# Patient Record
Sex: Female | Born: 1955 | Race: White | Hispanic: No | Smoking: Never smoker
Health system: Southern US, Community
[De-identification: ages and names within clinical notes are randomized; demographics above are authoritative.]

## PROBLEM LIST (undated history)

## (undated) DIAGNOSIS — F419 Anxiety disorder, unspecified: Secondary | ICD-10-CM

## (undated) DIAGNOSIS — Z9989 Dependence on other enabling machines and devices: Secondary | ICD-10-CM

## (undated) DIAGNOSIS — G709 Myoneural disorder, unspecified: Secondary | ICD-10-CM

## (undated) DIAGNOSIS — I10 Essential (primary) hypertension: Secondary | ICD-10-CM

## (undated) DIAGNOSIS — R351 Nocturia: Secondary | ICD-10-CM

## (undated) DIAGNOSIS — D126 Benign neoplasm of colon, unspecified: Secondary | ICD-10-CM

## (undated) DIAGNOSIS — J189 Pneumonia, unspecified organism: Secondary | ICD-10-CM

## (undated) DIAGNOSIS — T8859XA Other complications of anesthesia, initial encounter: Secondary | ICD-10-CM

## (undated) DIAGNOSIS — M179 Osteoarthritis of knee, unspecified: Secondary | ICD-10-CM

## (undated) DIAGNOSIS — N3020 Other chronic cystitis without hematuria: Secondary | ICD-10-CM

## (undated) DIAGNOSIS — Z9889 Other specified postprocedural states: Secondary | ICD-10-CM

## (undated) DIAGNOSIS — K4090 Unilateral inguinal hernia, without obstruction or gangrene, not specified as recurrent: Secondary | ICD-10-CM

## (undated) DIAGNOSIS — Z87898 Personal history of other specified conditions: Secondary | ICD-10-CM

## (undated) DIAGNOSIS — Z8719 Personal history of other diseases of the digestive system: Secondary | ICD-10-CM

## (undated) DIAGNOSIS — M1990 Unspecified osteoarthritis, unspecified site: Secondary | ICD-10-CM

## (undated) DIAGNOSIS — Z5189 Encounter for other specified aftercare: Secondary | ICD-10-CM

## (undated) DIAGNOSIS — G4730 Sleep apnea, unspecified: Secondary | ICD-10-CM

## (undated) DIAGNOSIS — K219 Gastro-esophageal reflux disease without esophagitis: Secondary | ICD-10-CM

## (undated) DIAGNOSIS — N393 Stress incontinence (female) (male): Secondary | ICD-10-CM

## (undated) DIAGNOSIS — R0600 Dyspnea, unspecified: Secondary | ICD-10-CM

## (undated) DIAGNOSIS — I639 Cerebral infarction, unspecified: Secondary | ICD-10-CM

## (undated) DIAGNOSIS — Z87440 Personal history of urinary (tract) infections: Secondary | ICD-10-CM

## (undated) DIAGNOSIS — N289 Disorder of kidney and ureter, unspecified: Secondary | ICD-10-CM

## (undated) DIAGNOSIS — D369 Benign neoplasm, unspecified site: Secondary | ICD-10-CM

## (undated) DIAGNOSIS — Z8673 Personal history of transient ischemic attack (TIA), and cerebral infarction without residual deficits: Secondary | ICD-10-CM

## (undated) DIAGNOSIS — M1710 Unilateral primary osteoarthritis, unspecified knee: Secondary | ICD-10-CM

## (undated) DIAGNOSIS — Z8614 Personal history of Methicillin resistant Staphylococcus aureus infection: Secondary | ICD-10-CM

## (undated) DIAGNOSIS — R51 Headache: Secondary | ICD-10-CM

## (undated) DIAGNOSIS — C4490 Unspecified malignant neoplasm of skin, unspecified: Secondary | ICD-10-CM

## (undated) DIAGNOSIS — G4733 Obstructive sleep apnea (adult) (pediatric): Secondary | ICD-10-CM

## (undated) DIAGNOSIS — T4145XA Adverse effect of unspecified anesthetic, initial encounter: Secondary | ICD-10-CM

## (undated) DIAGNOSIS — K5792 Diverticulitis of intestine, part unspecified, without perforation or abscess without bleeding: Secondary | ICD-10-CM

## (undated) DIAGNOSIS — T7840XA Allergy, unspecified, initial encounter: Secondary | ICD-10-CM

## (undated) DIAGNOSIS — R7303 Prediabetes: Secondary | ICD-10-CM

## (undated) DIAGNOSIS — Z8489 Family history of other specified conditions: Secondary | ICD-10-CM

## (undated) DIAGNOSIS — G629 Polyneuropathy, unspecified: Secondary | ICD-10-CM

## (undated) DIAGNOSIS — Z87442 Personal history of urinary calculi: Secondary | ICD-10-CM

## (undated) DIAGNOSIS — I509 Heart failure, unspecified: Secondary | ICD-10-CM

## (undated) DIAGNOSIS — E785 Hyperlipidemia, unspecified: Secondary | ICD-10-CM

## (undated) DIAGNOSIS — N182 Chronic kidney disease, stage 2 (mild): Secondary | ICD-10-CM

## (undated) DIAGNOSIS — D649 Anemia, unspecified: Secondary | ICD-10-CM

## (undated) DIAGNOSIS — J449 Chronic obstructive pulmonary disease, unspecified: Secondary | ICD-10-CM

## (undated) DIAGNOSIS — Z8709 Personal history of other diseases of the respiratory system: Secondary | ICD-10-CM

## (undated) DIAGNOSIS — K5730 Diverticulosis of large intestine without perforation or abscess without bleeding: Secondary | ICD-10-CM

## (undated) HISTORY — DX: Disorder of kidney and ureter, unspecified: N28.9

## (undated) HISTORY — DX: Allergy, unspecified, initial encounter: T78.40XA

## (undated) HISTORY — PX: MULTIPLE TOOTH EXTRACTIONS: SHX2053

## (undated) HISTORY — PX: WISDOM TOOTH EXTRACTION: SHX21

## (undated) HISTORY — DX: Sleep apnea, unspecified: G47.30

## (undated) HISTORY — DX: Hyperlipidemia, unspecified: E78.5

## (undated) HISTORY — DX: Chronic obstructive pulmonary disease, unspecified: J44.9

## (undated) HISTORY — PX: ESOPHAGEAL DILATION: SHX303

## (undated) HISTORY — DX: Gastro-esophageal reflux disease without esophagitis: K21.9

## (undated) HISTORY — DX: Encounter for other specified aftercare: Z51.89

## (undated) HISTORY — PX: POLYPECTOMY: SHX149

## (undated) HISTORY — DX: Myoneural disorder, unspecified: G70.9

---

## 1985-07-07 HISTORY — PX: TUBAL LIGATION: SHX77

## 2004-10-31 ENCOUNTER — Emergency Department (HOSPITAL_COMMUNITY): Admission: EM | Admit: 2004-10-31 | Discharge: 2004-10-31 | Payer: Self-pay | Admitting: Emergency Medicine

## 2005-01-07 ENCOUNTER — Ambulatory Visit (HOSPITAL_BASED_OUTPATIENT_CLINIC_OR_DEPARTMENT_OTHER): Admission: RE | Admit: 2005-01-07 | Discharge: 2005-01-07 | Payer: Self-pay | Admitting: Orthopedic Surgery

## 2005-01-19 ENCOUNTER — Ambulatory Visit: Payer: Self-pay | Admitting: Internal Medicine

## 2005-02-12 ENCOUNTER — Ambulatory Visit: Payer: Self-pay | Admitting: Internal Medicine

## 2005-03-07 ENCOUNTER — Ambulatory Visit: Payer: Self-pay | Admitting: Internal Medicine

## 2005-03-13 ENCOUNTER — Ambulatory Visit (HOSPITAL_COMMUNITY): Admission: RE | Admit: 2005-03-13 | Discharge: 2005-03-13 | Payer: Self-pay | Admitting: Internal Medicine

## 2005-04-16 ENCOUNTER — Ambulatory Visit (HOSPITAL_COMMUNITY): Admission: RE | Admit: 2005-04-16 | Discharge: 2005-04-17 | Payer: Self-pay | Admitting: Orthopedic Surgery

## 2005-05-05 ENCOUNTER — Ambulatory Visit: Payer: Self-pay | Admitting: Internal Medicine

## 2005-05-06 ENCOUNTER — Ambulatory Visit (HOSPITAL_COMMUNITY): Admission: RE | Admit: 2005-05-06 | Discharge: 2005-05-06 | Payer: Self-pay | Admitting: Internal Medicine

## 2005-05-14 ENCOUNTER — Ambulatory Visit: Payer: Self-pay | Admitting: Internal Medicine

## 2005-05-23 ENCOUNTER — Ambulatory Visit: Payer: Self-pay | Admitting: Internal Medicine

## 2005-05-23 ENCOUNTER — Ambulatory Visit (HOSPITAL_COMMUNITY): Admission: RE | Admit: 2005-05-23 | Discharge: 2005-05-23 | Payer: Self-pay | Admitting: Internal Medicine

## 2005-07-29 ENCOUNTER — Inpatient Hospital Stay (HOSPITAL_COMMUNITY): Admission: EM | Admit: 2005-07-29 | Discharge: 2005-07-30 | Payer: Self-pay | Admitting: Emergency Medicine

## 2005-08-29 ENCOUNTER — Ambulatory Visit: Payer: Self-pay | Admitting: Internal Medicine

## 2005-09-10 ENCOUNTER — Ambulatory Visit: Payer: Self-pay | Admitting: Internal Medicine

## 2005-10-02 ENCOUNTER — Ambulatory Visit (HOSPITAL_COMMUNITY): Admission: RE | Admit: 2005-10-02 | Discharge: 2005-10-03 | Payer: Self-pay | Admitting: Orthopedic Surgery

## 2006-07-07 HISTORY — PX: KNEE ARTHROSCOPY: SUR90

## 2010-08-20 ENCOUNTER — Telehealth (INDEPENDENT_AMBULATORY_CARE_PROVIDER_SITE_OTHER): Payer: Self-pay | Admitting: *Deleted

## 2010-08-28 NOTE — Progress Notes (Signed)
  Phone Note Other Incoming   Request: Send information Summary of Call: Request for records received from [facility]. Request forwarded to [facility].  07/07/2008-D Brodie...chrt

## 2012-06-21 ENCOUNTER — Emergency Department (HOSPITAL_COMMUNITY)
Admission: EM | Admit: 2012-06-21 | Discharge: 2012-06-22 | Disposition: A | Discharge status: Home / Self Care | Payer: Medicaid Other | Attending: Emergency Medicine | Admitting: Emergency Medicine

## 2012-06-21 ENCOUNTER — Inpatient Hospital Stay (HOSPITAL_COMMUNITY)
Admission: AD | Admit: 2012-06-21 | Discharge: 2012-06-21 | Disposition: A | Discharge status: Home / Self Care | Payer: Self-pay | Source: Ambulatory Visit | Attending: Obstetrics & Gynecology | Admitting: Obstetrics & Gynecology

## 2012-06-21 DIAGNOSIS — R269 Unspecified abnormalities of gait and mobility: Secondary | ICD-10-CM | POA: Insufficient documentation

## 2012-06-21 DIAGNOSIS — Z79899 Other long term (current) drug therapy: Secondary | ICD-10-CM | POA: Insufficient documentation

## 2012-06-21 DIAGNOSIS — R112 Nausea with vomiting, unspecified: Secondary | ICD-10-CM | POA: Insufficient documentation

## 2012-06-21 DIAGNOSIS — R319 Hematuria, unspecified: Secondary | ICD-10-CM | POA: Insufficient documentation

## 2012-06-21 DIAGNOSIS — R109 Unspecified abdominal pain: Secondary | ICD-10-CM | POA: Insufficient documentation

## 2012-06-21 DIAGNOSIS — J029 Acute pharyngitis, unspecified: Secondary | ICD-10-CM | POA: Insufficient documentation

## 2012-06-21 DIAGNOSIS — Z7982 Long term (current) use of aspirin: Secondary | ICD-10-CM | POA: Insufficient documentation

## 2012-06-21 DIAGNOSIS — Z8673 Personal history of transient ischemic attack (TIA), and cerebral infarction without residual deficits: Secondary | ICD-10-CM | POA: Insufficient documentation

## 2012-06-21 DIAGNOSIS — R197 Diarrhea, unspecified: Secondary | ICD-10-CM | POA: Insufficient documentation

## 2012-06-21 DIAGNOSIS — K921 Melena: Secondary | ICD-10-CM | POA: Insufficient documentation

## 2012-06-21 DIAGNOSIS — I10 Essential (primary) hypertension: Secondary | ICD-10-CM | POA: Insufficient documentation

## 2012-06-21 DIAGNOSIS — M129 Arthropathy, unspecified: Secondary | ICD-10-CM | POA: Insufficient documentation

## 2012-06-21 DIAGNOSIS — Z8719 Personal history of other diseases of the digestive system: Secondary | ICD-10-CM | POA: Insufficient documentation

## 2012-06-21 DIAGNOSIS — N390 Urinary tract infection, site not specified: Secondary | ICD-10-CM

## 2012-06-21 DIAGNOSIS — M549 Dorsalgia, unspecified: Secondary | ICD-10-CM | POA: Insufficient documentation

## 2012-06-21 HISTORY — DX: Essential (primary) hypertension: I10

## 2012-06-21 HISTORY — DX: Cerebral infarction, unspecified: I63.9

## 2012-06-21 HISTORY — DX: Diverticulitis of intestine, part unspecified, without perforation or abscess without bleeding: K57.92

## 2012-06-21 HISTORY — DX: Unspecified osteoarthritis, unspecified site: M19.90

## 2012-06-22 ENCOUNTER — Emergency Department (HOSPITAL_COMMUNITY): Payer: Medicaid Other

## 2012-06-22 ENCOUNTER — Encounter (HOSPITAL_COMMUNITY): Payer: Self-pay | Admitting: *Deleted

## 2012-06-22 ENCOUNTER — Encounter: Payer: Self-pay | Admitting: Internal Medicine

## 2012-06-22 LAB — CBC WITH DIFFERENTIAL/PLATELET
Basophils Absolute: 0.1 10*3/uL (ref 0.0–0.1)
Basophils Relative: 1 % (ref 0–1)
Eosinophils Absolute: 0.2 10*3/uL (ref 0.0–0.7)
Eosinophils Relative: 3 % (ref 0–5)
HCT: 39.5 % (ref 36.0–46.0)
Hemoglobin: 13.3 g/dL (ref 12.0–15.0)
Lymphocytes Relative: 32 % (ref 12–46)
Lymphs Abs: 2.5 10*3/uL (ref 0.7–4.0)
MCH: 32.7 pg (ref 26.0–34.0)
MCHC: 33.7 g/dL (ref 30.0–36.0)
MCV: 97.1 fL (ref 78.0–100.0)
Monocytes Absolute: 0.6 10*3/uL (ref 0.1–1.0)
Monocytes Relative: 8 % (ref 3–12)
Neutro Abs: 4.3 10*3/uL (ref 1.7–7.7)
Neutrophils Relative %: 56 % (ref 43–77)
Platelets: 329 10*3/uL (ref 150–400)
RBC: 4.07 MIL/uL (ref 3.87–5.11)
RDW: 13.5 % (ref 11.5–15.5)
WBC: 7.7 10*3/uL (ref 4.0–10.5)

## 2012-06-22 LAB — URINALYSIS, ROUTINE W REFLEX MICROSCOPIC
Bilirubin Urine: NEGATIVE
Glucose, UA: NEGATIVE mg/dL
Ketones, ur: NEGATIVE mg/dL
Nitrite: NEGATIVE
Protein, ur: NEGATIVE mg/dL
Specific Gravity, Urine: 1.031 — ABNORMAL HIGH (ref 1.005–1.030)
Urobilinogen, UA: 1 mg/dL (ref 0.0–1.0)
pH: 6 (ref 5.0–8.0)

## 2012-06-22 LAB — OCCULT BLOOD, POC DEVICE: Fecal Occult Bld: NEGATIVE

## 2012-06-22 LAB — URINE MICROSCOPIC-ADD ON

## 2012-06-22 LAB — COMPREHENSIVE METABOLIC PANEL
ALT: 17 U/L (ref 0–35)
AST: 20 U/L (ref 0–37)
Albumin: 3.6 g/dL (ref 3.5–5.2)
Alkaline Phosphatase: 63 U/L (ref 39–117)
BUN: 15 mg/dL (ref 6–23)
CO2: 28 mEq/L (ref 19–32)
Calcium: 9.2 mg/dL (ref 8.4–10.5)
Chloride: 104 mEq/L (ref 96–112)
Creatinine, Ser: 0.91 mg/dL (ref 0.50–1.10)
GFR calc Af Amer: 81 mL/min — ABNORMAL LOW (ref >90)
GFR calc non Af Amer: 70 mL/min — ABNORMAL LOW (ref >90)
Glucose, Bld: 108 mg/dL — ABNORMAL HIGH (ref 70–99)
Potassium: 4 mEq/L (ref 3.5–5.1)
Sodium: 142 mEq/L (ref 135–145)
Total Bilirubin: 0.2 mg/dL — ABNORMAL LOW (ref 0.3–1.2)
Total Protein: 7.5 g/dL (ref 6.0–8.3)

## 2012-06-22 MED ORDER — CEPHALEXIN 500 MG PO CAPS: 500.0000 mg | ORAL_CAPSULE | Freq: Four times a day (QID) | ORAL | Status: DC

## 2012-06-22 MED ORDER — SODIUM CHLORIDE 0.9 % IV BOLUS (SEPSIS)
1000.0000 mL | Freq: Once | INTRAVENOUS | Status: AC
  Administered 2012-06-22: 1000 mL via INTRAVENOUS

## 2012-06-22 MED ORDER — CEFTRIAXONE SODIUM 1 G IJ SOLR
1.0000 g | INTRAMUSCULAR | Status: DC
  Administered 2012-06-22: 1 g via INTRAVENOUS
  Filled 2012-06-22: qty 10

## 2012-06-22 NOTE — ED Notes (Signed)
Attempted to round - pt in restroom

## 2012-06-22 NOTE — ED Provider Notes (Signed)
History     CSN: [id number]  Arrival date & time 06/21/12  2351   First MD Initiated Contact with Patient 06/22/12 0026      Chief Complaint  Patient presents with  . Multiple complaints     (Consider location/radiation/quality/duration/timing/severity/associated sxs/prior treatment) Patient is a 56 y.o. female presenting with dysuria and hematochezia. The history is provided by the patient and a relative. No language interpreter was used.  Dysuria  The current episode started more than 1 week ago. The problem occurs every urination. The problem has not changed since onset.The quality of the pain is described as burning. The pain is at a severity of 5/10. The pain is moderate. There has been no fever. Associated symptoms include nausea, vomiting, hematuria and flank pain. She has tried nothing for the symptoms. Her past medical history does not include kidney stones, urological procedure or recurrent UTIs.  Rectal Bleeding  The current episode started more than 2 weeks ago. The problem occurs occasionally. The problem has been unchanged. The pain is mild. The stool is described as streaked with blood. There was no prior successful therapy. Associated symptoms include abdominal pain, diarrhea, nausea, vomiting and hematuria. Pertinent negatives include no vaginal bleeding and no vaginal discharge. She has been behaving normally. She has been eating and drinking normally. Her past medical history is significant for inflammatory bowel disease.   56 year old female coming in with multiple complaints including rectal bleeding, dysuria, back pain, lightheaded. Patient states that she also has sleep apnea but has never had BiPAP machine at home. Patient has not been to the doctor since 2008 she was diagnosed with diverticulitis because of no insurance and [place] put a lean on her house because she could not pay bill.  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 She's also  complaining of pain in her right heel and thinks there is something in her foot. No acute distress. Medical records in [place]. Also c/o sore throat when swallowing 2/10 with prior esophogeal stricture and repair per Dr. Juanda Chance.  No difficulty swallowing today.   Past Medical History  Diagnosis Date  . Apnea   . Hypertension   . Diverticulitis   . Stroke   . Arthritis     Past Surgical History  Procedure Date  . Tubal ligation   . Knee arthroscopy     No family history on file.  History  Substance Use Topics  . Smoking status: Never Smoker   . Smokeless tobacco: Never Used  . Alcohol Use: No    OB History    Grav Para Term Preterm Abortions TAB SAB Ect Mult Living                  Review of Systems  Constitutional: Negative.   HENT: Positive for sore throat. Negative for facial swelling, trouble swallowing and neck pain.   Eyes: Negative.   Respiratory: Negative.  Negative for choking and shortness of breath.   Cardiovascular: Negative.  Negative for palpitations and leg swelling.  Gastrointestinal: Positive for nausea, vomiting, abdominal pain, diarrhea and hematochezia.  Genitourinary: Positive for dysuria, hematuria and flank pain. Negative for vaginal bleeding and vaginal discharge.  Musculoskeletal: Positive for back pain and gait problem.  Neurological: Negative.   Psychiatric/Behavioral: Negative.   All other systems reviewed and are negative.    Allergies  Review of patient's allergies indicates not on file.  Home Medications  No current outpatient prescriptions on file.  BP 162/92  Pulse 79  Temp 97.7 F (36.5 C) (Oral)  Resp 20  SpO2 98%  Physical Exam  Nursing note and vitals reviewed. Constitutional: She is oriented to person, place, and time. She appears well-developed and well-nourished.       obese  HENT:  Head: Normocephalic and atraumatic.  Eyes: Conjunctivae normal and EOM are normal. Pupils are equal, round, and reactive to light.   Neck: Normal range of motion. Neck supple. No tracheal deviation present.  Cardiovascular: Normal rate, regular rhythm and normal heart sounds.   Pulmonary/Chest: Effort normal and breath sounds normal. No stridor. No respiratory distress.  Abdominal: Soft. Bowel sounds are normal. She exhibits no distension and no mass. There is tenderness. There is no rebound and no guarding.       Suprapubic tenderness  Musculoskeletal: Normal range of motion. She exhibits no edema and no tenderness.       Tenderness to L heel.  No deformity noted. +cms to foot.   Lymphadenopathy:    She has no cervical adenopathy.  Neurological: She is alert and oriented to person, place, and time. She has normal reflexes.  Skin: Skin is warm and dry.  Psychiatric: She has a normal mood and affect.    ED Course  Procedures (including critical care time)  Labs Reviewed  COMPREHENSIVE METABOLIC PANEL - Abnormal; Notable for the following:    Glucose, Bld 108 (*)     Total Bilirubin 0.2 (*)     GFR calc non Af Amer 70 (*)     GFR calc Af Amer 81 (*)     All other components within normal limits  CBC WITH DIFFERENTIAL  URINALYSIS, ROUTINE W REFLEX MICROSCOPIC  OCCULT BLOOD X 1 CARD TO LAB, STOOL   No results found.   No diagnosis found.    MDM  56 yo female with multiple complaints including, foot pain. Rectal bleeding/pain, dysuria, hematuria, sore throat. + UTI. No fever or elevated WBC.  No abdominal pain on exam.  - stool guiac, Hgb 13.3. Urine specific gravity 1.031.  NS bolus and IV rocephen in the ER.  She will follow up with GI this week for the rectal bleeding.  No foreign body in L heel x-ray reviewed by myself.  Follow up with ortho for her foot pain.  Take IBUprofen for pain.  She understands to return for n/v, fever or abdominal pain.          Remi Haggard, NP 06/22/12 1135

## 2012-06-22 NOTE — ED Notes (Signed)
Pt from home with c/o rectal bleeding x 2 months. Pt sts that she has seen both bright red blood and dark stools for 2 months. Patient also c/o hematuria x 2 months. Sts she is having right flank pain along with hematuria. Patient also c/o right foot pain x 2 months. Sts she is hardly able to walk due to the pain in her heel. Pt c/o throat pain at this time as well- sts that she has had her esophagus stretched in 2008- feels as if there is fullness in her throat. Denies shob. Patient alert and oriented at this time.

## 2012-06-22 NOTE — ED Notes (Signed)
NP at bedside.

## 2012-06-22 NOTE — ED Notes (Signed)
Pt having dark bloody stools for 2 months and c/o blood in urine (rust color).

## 2012-06-22 NOTE — ED Provider Notes (Signed)
Medical screening examination/treatment/procedure(s) were performed by non-physician practitioner and as supervising physician I was immediately available for consultation/collaboration.  Bridie Colquhoun, MD 06/22/12 2254 

## 2012-06-29 LAB — URINE CULTURE: Colony Count: 15000

## 2012-07-02 ENCOUNTER — Encounter: Payer: Self-pay | Admitting: Internal Medicine

## 2012-07-02 ENCOUNTER — Encounter: Payer: Self-pay | Admitting: *Deleted

## 2012-07-27 ENCOUNTER — Encounter (HOSPITAL_COMMUNITY): Payer: Self-pay | Admitting: *Deleted

## 2012-07-27 ENCOUNTER — Inpatient Hospital Stay (HOSPITAL_COMMUNITY)
Admission: EM | Admit: 2012-07-27 | Discharge: 2012-08-01 | DRG: 193 | Disposition: A | Discharge status: Home / Self Care | Payer: Medicaid Other | Attending: Internal Medicine | Admitting: Internal Medicine

## 2012-07-27 ENCOUNTER — Emergency Department (HOSPITAL_COMMUNITY): Payer: Medicaid Other

## 2012-07-27 DIAGNOSIS — R05 Cough: Secondary | ICD-10-CM | POA: Diagnosis present

## 2012-07-27 DIAGNOSIS — Z23 Encounter for immunization: Secondary | ICD-10-CM

## 2012-07-27 DIAGNOSIS — Z8719 Personal history of other diseases of the digestive system: Secondary | ICD-10-CM

## 2012-07-27 DIAGNOSIS — J9600 Acute respiratory failure, unspecified whether with hypoxia or hypercapnia: Secondary | ICD-10-CM

## 2012-07-27 DIAGNOSIS — I10 Essential (primary) hypertension: Secondary | ICD-10-CM | POA: Diagnosis present

## 2012-07-27 DIAGNOSIS — Z79899 Other long term (current) drug therapy: Secondary | ICD-10-CM

## 2012-07-27 DIAGNOSIS — G4733 Obstructive sleep apnea (adult) (pediatric): Secondary | ICD-10-CM | POA: Diagnosis present

## 2012-07-27 DIAGNOSIS — R059 Cough, unspecified: Secondary | ICD-10-CM | POA: Diagnosis present

## 2012-07-27 DIAGNOSIS — J9691 Respiratory failure, unspecified with hypoxia: Secondary | ICD-10-CM

## 2012-07-27 DIAGNOSIS — E875 Hyperkalemia: Secondary | ICD-10-CM | POA: Diagnosis not present

## 2012-07-27 DIAGNOSIS — J189 Pneumonia, unspecified organism: Principal | ICD-10-CM | POA: Diagnosis present

## 2012-07-27 DIAGNOSIS — Z8673 Personal history of transient ischemic attack (TIA), and cerebral infarction without residual deficits: Secondary | ICD-10-CM

## 2012-07-27 LAB — CBC WITH DIFFERENTIAL/PLATELET
Basophils Relative: 0 % (ref 0–1)
Hemoglobin: 12.7 g/dL (ref 12.0–15.0)
Lymphs Abs: 1.1 10*3/uL (ref 0.7–4.0)
Monocytes Relative: 7 % (ref 3–12)
Neutro Abs: 4.1 10*3/uL (ref 1.7–7.7)
Neutrophils Relative %: 73 % (ref 43–77)
RBC: 4.04 MIL/uL (ref 3.87–5.11)

## 2012-07-27 LAB — BASIC METABOLIC PANEL
BUN: 8 mg/dL (ref 6–23)
Chloride: 103 mEq/L (ref 96–112)
GFR calc Af Amer: >90 mL/min (ref >90)
Glucose, Bld: 131 mg/dL — ABNORMAL HIGH (ref 70–99)
Potassium: 3.8 mEq/L (ref 3.5–5.1)
Sodium: 137 mEq/L (ref 135–145)

## 2012-07-27 MED ORDER — KETOROLAC TROMETHAMINE 30 MG/ML IJ SOLN
30.0000 mg | Freq: Once | INTRAMUSCULAR | Status: AC
  Administered 2012-07-27: 30 mg via INTRAVENOUS
  Filled 2012-07-27: qty 1

## 2012-07-27 MED ORDER — IPRATROPIUM BROMIDE 0.02 % IN SOLN
0.5000 mg | Freq: Once | RESPIRATORY_TRACT | Status: AC
  Administered 2012-07-27: 0.5 mg via RESPIRATORY_TRACT
  Filled 2012-07-27: qty 2.5

## 2012-07-27 MED ORDER — DEXTROSE 5 % IV SOLN
1.0000 g | Freq: Once | INTRAVENOUS | Status: AC
  Administered 2012-07-27: 1 g via INTRAVENOUS
  Filled 2012-07-27: qty 10

## 2012-07-27 MED ORDER — AZITHROMYCIN 250 MG PO TABS
500.0000 mg | ORAL_TABLET | Freq: Once | ORAL | Status: AC
  Administered 2012-07-27: 500 mg via ORAL
  Filled 2012-07-27: qty 2

## 2012-07-27 MED ORDER — DEXAMETHASONE SODIUM PHOSPHATE 10 MG/ML IJ SOLN
10.0000 mg | Freq: Once | INTRAMUSCULAR | Status: AC
  Administered 2012-07-27: 10 mg via INTRAVENOUS
  Filled 2012-07-27: qty 1

## 2012-07-27 MED ORDER — HYDROCOD POLST-CHLORPHEN POLST 10-8 MG/5ML PO LQCR
5.0000 mL | Freq: Once | ORAL | Status: AC
  Administered 2012-07-27: 5 mL via ORAL
  Filled 2012-07-27: qty 5

## 2012-07-27 MED ORDER — ALBUTEROL SULFATE (5 MG/ML) 0.5% IN NEBU
5.0000 mg | INHALATION_SOLUTION | Freq: Once | RESPIRATORY_TRACT | Status: AC
  Administered 2012-07-27: 5 mg via RESPIRATORY_TRACT
  Filled 2012-07-27: qty 1

## 2012-07-27 MED ORDER — SODIUM CHLORIDE 0.9 % IV BOLUS (SEPSIS)
500.0000 mL | Freq: Once | INTRAVENOUS | Status: AC
  Administered 2012-07-27: 500 mL via INTRAVENOUS

## 2012-07-27 MED ORDER — METOCLOPRAMIDE HCL 5 MG/ML IJ SOLN
10.0000 mg | Freq: Once | INTRAMUSCULAR | Status: AC
  Administered 2012-07-27: 10 mg via INTRAVENOUS
  Filled 2012-07-27: qty 2

## 2012-07-27 MED ORDER — DIPHENHYDRAMINE HCL 50 MG/ML IJ SOLN
25.0000 mg | Freq: Once | INTRAMUSCULAR | Status: AC
  Administered 2012-07-27: 25 mg via INTRAVENOUS
  Filled 2012-07-27: qty 1

## 2012-07-27 NOTE — ED Notes (Signed)
Patient transported to X-ray 

## 2012-07-27 NOTE — ED Notes (Signed)
Pt states for over a week now she's had a congested cough, green mucus, headache, head pressure. Pt states it hurts to take a deep breath.

## 2012-07-27 NOTE — ED Provider Notes (Signed)
History     CSN: [id number]  Arrival date & time 07/27/12  1644   First MD Initiated Contact with Patient 07/27/12 2012      Chief Complaint  Patient presents with  . Cough  . Headache   HPI  History provided by the patient. Patient is a 57 year old female with history of hypertension and TIA who presents with complaints of persistent productive cough and shortness of breath. Patient has had productive cough with green sputum for the past week and a half. Symptoms have been gradually worsening and are not improve with over-the-counter cough and cold medications. Patient now also has associated general throbbing headache similar to her typical migraine headaches but more diffuse. Headache is worse with coughing. She has some chest discomfort with deep breath and cough. She has had some subjective fevers and chills over the past 2 days. This has improved slightly with Tylenol. Denies any episodes of vomiting or diarrhea. Denies any swelling in extremities.     Past Medical History  Diagnosis Date  . Apnea   . Hypertension   . Diverticulitis   . Stroke   . Arthritis   . Hiatal hernia   . Esophageal stricture     Past Surgical History  Procedure Date  . Tubal ligation   . Knee arthroscopy     History reviewed. No pertinent family history.  History  Substance Use Topics  . Smoking status: Never Smoker   . Smokeless tobacco: Never Used  . Alcohol Use: No    OB History    Grav Para Term Preterm Abortions TAB SAB Ect Mult Living                  Review of Systems  Constitutional: Positive for fever, chills and appetite change.  HENT: Positive for congestion.   Respiratory: Positive for cough and shortness of breath.   Cardiovascular: Negative for palpitations and leg swelling.  Neurological: Positive for headaches. Negative for light-headedness.  All other systems reviewed and are negative.    Allergies  Review of patient's allergies indicates no known  allergies.  Home Medications   Current Outpatient Rx  Name  Route  Sig  Dispense  Refill  . ACETAMINOPHEN 325 MG PO TABS   Oral   Take 650 mg by mouth every 6 (six) hours as needed.         . ASPIRIN EC 81 MG PO TBEC   Oral   Take 81 mg by mouth every evening.         Merrilee [place]ashore D PO   Oral   Take 1 tablet by mouth 2 (two) times daily as needed. Flu-like symptoms / cough         . IBUPROFEN 200 MG PO TABS   Oral   Take 200 mg by mouth every 6 (six) hours as needed. For pain or headache         . LISINOPRIL 20 MG PO TABS   Oral   Take 20 mg by mouth 2 (two) times daily.           BP 138/81  Pulse 79  Temp 99.3 F (37.4 C) (Oral)  Resp 20  SpO2 93%  Physical Exam  Nursing note and vitals reviewed. Constitutional: She is oriented to person, place, and time. She appears well-developed and well-nourished. No distress.  HENT:  Head: Normocephalic.  Mouth/Throat: Oropharynx is clear and moist.  Neck: Normal range of motion. Neck supple. No JVD present.  Cardiovascular:  Normal rate and regular rhythm.   No murmur heard. Pulmonary/Chest: Effort normal. No respiratory distress. She has wheezes. She has rales.  Abdominal: Soft. There is no tenderness. There is no rebound and no guarding.  Musculoskeletal: Normal range of motion. She exhibits no edema and no tenderness.  Neurological: She is alert and oriented to person, place, and time.  Skin: Skin is warm and dry. No rash noted. No erythema.  Psychiatric: She has a normal mood and affect. Her behavior is normal.    ED Course  Procedures   Results for orders placed during the hospital encounter of 07/27/12  CBC WITH DIFFERENTIAL      Component Value Range   WBC 5.6  4.0 - 10.5 K/uL   RBC 4.04  3.87 - 5.11 MIL/uL   Hemoglobin 12.7  12.0 - 15.0 g/dL   HCT 84.6  96.2 - 95.2 %   MCV 98.0  78.0 - 100.0 fL   MCH 31.4  26.0 - 34.0 pg   MCHC 32.1  30.0 - 36.0 g/dL   RDW 84.1  32.4 - 40.1 %   Platelets 224   150 - 400 K/uL   Neutrophils Relative 73  43 - 77 %   Neutro Abs 4.1  1.7 - 7.7 K/uL   Lymphocytes Relative 20  12 - 46 %   Lymphs Abs 1.1  0.7 - 4.0 K/uL   Monocytes Relative 7  3 - 12 %   Monocytes Absolute 0.4  0.1 - 1.0 K/uL   Eosinophils Relative 1  0 - 5 %   Eosinophils Absolute 0.0  0.0 - 0.7 K/uL   Basophils Relative 0  0 - 1 %   Basophils Absolute 0.0  0.0 - 0.1 K/uL  BASIC METABOLIC PANEL      Component Value Range   Sodium 137  135 - 145 mEq/L   Potassium 3.8  3.5 - 5.1 mEq/L   Chloride 103  96 - 112 mEq/L   CO2 24  19 - 32 mEq/L   Glucose, Bld 131 (*) 70 - 99 mg/dL   BUN 8  6 - 23 mg/dL   Creatinine, Ser 0.27  0.50 - 1.10 mg/dL   Calcium 8.6  8.4 - 25.3 mg/dL   GFR calc non Af Amer >90  >90 mL/min   GFR calc Af Amer >90  >90 mL/min  PRO B NATRIURETIC PEPTIDE      Component Value Range   Pro B Natriuretic peptide (BNP) 129.9 (*) 0 - 125 pg/mL       Dg Chest 2 View  07/27/2012  *RADIOLOGY REPORT*  Clinical Data: Cough and weakness.  CHEST - 2 VIEW  Comparison: 07/29/2005  Findings: Two views of the chest demonstrate patchy densities in the lungs, left side greater than right.  There is also fullness in the right paratracheal region which could be related to technique and slightly low lung volume.  Overall, the heart size is within normal limits.  No evidence for pleural effusions.  Degenerative changes in the thoracic spine.  IMPRESSION: New patchy densities in the lungs, particularly on the left side. Findings are concerning for multifocal pneumonia versus asymmetric edema.  Recommend follow up imaging to ensure resolution.  Fullness in the right paratracheal region.  Recommend attention to this area on followup imaging to exclude lymphadenopathy.   Original Report Authenticated By: Richarda Overlie, M.D.      1. CAP (community acquired pneumonia)       MDM  8:20 PM  patient seen and evaluated. Patient currently resting in bed appears comfortable. She is on 2 L O2 nasal  cannula with saturation between 94-96%.  X-ray showing diffuse opacities in bilateral lungs. Patient with no prior history of CHF. No other clinical signs concerning for CHF.  Patient discussed with attending physician. Will plan on admit.  Spoke with [facility]. They'll see patient and admit to med surge bed.    Angus Seller, [place] 07/27/12 [phone]

## 2012-07-27 NOTE — ED Notes (Signed)
Cough and headache for 2 to 3 days,  Along with fevers and weakness

## 2012-07-27 NOTE — ED Notes (Signed)
Attempted IV twice, IV unsuccessful, pt didn't tolerate well

## 2012-07-27 NOTE — H&P (Signed)
Chief Complaint:  Cough, fever  HPI: 57 yo female with cough and wheezing for over one week.  No prior lung disease or h/o asthma.  Nonsmoker but husband smokes a lot in home.  Several days of worsening sob and wheezing with fever.  Found to have bilateral pna on cxr.  Gets pna one to 2 times a year.  Wheezes mult times a month.  Never been evaluated.  No n/v.  No dysuria.  No cp, abd pain.  Review of Systems:  O/w neg  Past Medical History: Past Medical History  Diagnosis Date  . Apnea   . Hypertension   . Diverticulitis   . Stroke   . Arthritis   . Hiatal hernia   . Esophageal stricture    Past Surgical History  Procedure Date  . Tubal ligation   . Knee arthroscopy     Medications: Prior to Admission medications   Medication Sig Start Date End Date Taking? Authorizing Provider  acetaminophen (TYLENOL) 325 MG tablet Take 650 mg by mouth every 6 (six) hours as needed.   Yes Historical Provider, MD  aspirin EC 81 MG tablet Take 81 mg by mouth every evening.   Yes Historical Provider, MD  Chlorphen-Pseudoephed-APAP (CORICIDIN D PO) Take 1 tablet by mouth 2 (two) times daily as needed. Flu-like symptoms / cough   Yes Historical Provider, MD  ibuprofen (ADVIL,MOTRIN) 200 MG tablet Take 200 mg by mouth every 6 (six) hours as needed. For pain or headache   Yes Historical Provider, MD  lisinopril (PRINIVIL,ZESTRIL) 20 MG tablet Take 20 mg by mouth 2 (two) times daily.   Yes Historical Provider, MD    Allergies:  No Known Allergies  Social History:  reports that she has never smoked. She has never used smokeless tobacco. She reports that she does not drink alcohol or use illicit drugs.  Family History: History reviewed. No pertinent family history.  Physical Exam: Filed Vitals:   07/27/12 1832 07/27/12 2044 07/27/12 2326  BP: 138/81  165/59  Pulse: 77 79 96  Temp: 99.3 F (37.4 C)  98.2 F (36.8 C)  TempSrc: Oral  Oral  Resp: 18 20 22   SpO2: 91% 93% 96%   General  appearance: alert, cooperative and mild distress Neck: no JVD and supple, symmetrical, trachea midline Lungs: wheezes bilaterally Heart: regular rate and rhythm, S1, S2 normal, no murmur, click, rub or gallop Abdomen: soft, non-tender; bowel sounds normal; no masses,  no organomegaly Extremities: extremities normal, atraumatic, no cyanosis or edema Pulses: 2+ and symmetric Skin: Skin color, texture, turgor normal. No rashes or lesions Neurologic: Grossly normal    Labs on Admission:    [facility] 07/27/12 2149  WBC 5.6  NEUTROABS 4.1  HGB 12.7  HCT 39.6  MCV 98.0  PLT 224   Radiological Exams on Admission: Dg Chest 2 View  07/27/2012  *RADIOLOGY REPORT*  Clinical Data: Cough and weakness.  CHEST - 2 VIEW  Comparison: 07/29/2005  Findings: Two views of the chest demonstrate patchy densities in the lungs, left side greater than right.  There is also fullness in the right paratracheal region which could be related to technique and slightly low lung volume.  Overall, the heart size is within normal limits.  No evidence for pleural effusions.  Degenerative changes in the thoracic spine.  IMPRESSION: New patchy densities in the lungs, particularly on the left side. Findings are concerning for multifocal pneumonia versus asymmetric edema.  Recommend follow up imaging to ensure resolution.  Fullness  in the right paratracheal region.  Recommend attention to this area on followup imaging to exclude lymphadenopathy.   Original Report Authenticated By: Richarda Overlie, M.D.     Assessment/Plan  57 yo female with hypoxic resp failure from bilateral CAP Principal Problem:  *Respiratory failure with hypoxia Active Problems:  Hypertension  CAP (community acquired pneumonia)  Cough  Place on pna pathway.  freq nebs and iv abx.  May need steroids if does not have improvement with albuterol.  Suspect underlying asthma.  Will need PFT after she recovers from this illness.  Oxygen prn.  Full  code.  Roselle Norton A 07/27/2012, 11:32 PM

## 2012-07-27 NOTE — ED Notes (Signed)
When ambulating with pt to the bathroom pt O2 sats was at 80% RM, and HR at 116.  RN was notified of O2 sats and HR

## 2012-07-28 LAB — CBC
MCV: 99 fL (ref 78.0–100.0)
Platelets: 232 10*3/uL (ref 150–400)
RBC: 3.85 MIL/uL — ABNORMAL LOW (ref 3.87–5.11)
RDW: 13.7 % (ref 11.5–15.5)
WBC: 7.1 10*3/uL (ref 4.0–10.5)

## 2012-07-28 LAB — CREATININE, SERUM
Creatinine, Ser: 0.72 mg/dL (ref 0.50–1.10)
GFR calc Af Amer: >90 mL/min (ref >90)
GFR calc non Af Amer: >90 mL/min (ref >90)

## 2012-07-28 MED ORDER — ALBUTEROL SULFATE (5 MG/ML) 0.5% IN NEBU
2.5000 mg | INHALATION_SOLUTION | RESPIRATORY_TRACT | Status: DC | PRN
  Administered 2012-07-28 – 2012-08-01 (×6): 2.5 mg via RESPIRATORY_TRACT
  Filled 2012-07-28 (×7): qty 0.5

## 2012-07-28 MED ORDER — ALBUTEROL SULFATE (5 MG/ML) 0.5% IN NEBU: 2.5000 mg | INHALATION_SOLUTION | Freq: Four times a day (QID) | RESPIRATORY_TRACT | Status: DC

## 2012-07-28 MED ORDER — KETOROLAC TROMETHAMINE 30 MG/ML IJ SOLN
30.0000 mg | Freq: Once | INTRAMUSCULAR | Status: AC
  Administered 2012-07-28: 30 mg via INTRAVENOUS
  Filled 2012-07-28: qty 1

## 2012-07-28 MED ORDER — GUAIFENESIN 100 MG/5ML PO SOLN
200.0000 mg | ORAL | Status: DC | PRN
  Administered 2012-07-28 – 2012-08-01 (×15): 200 mg via ORAL
  Filled 2012-07-28 (×15): qty 10

## 2012-07-28 MED ORDER — METOCLOPRAMIDE HCL 5 MG/ML IJ SOLN
10.0000 mg | Freq: Once | INTRAMUSCULAR | Status: AC
  Administered 2012-07-28: 10 mg via INTRAVENOUS
  Filled 2012-07-28: qty 2

## 2012-07-28 MED ORDER — DIPHENHYDRAMINE HCL 25 MG PO CAPS
25.0000 mg | ORAL_CAPSULE | Freq: Every evening | ORAL | Status: DC | PRN
  Administered 2012-07-28: 25 mg via ORAL
  Filled 2012-07-28: qty 1

## 2012-07-28 MED ORDER — ALBUTEROL SULFATE (5 MG/ML) 0.5% IN NEBU
2.5000 mg | INHALATION_SOLUTION | Freq: Four times a day (QID) | RESPIRATORY_TRACT | Status: DC
  Administered 2012-07-28 – 2012-08-01 (×15): 2.5 mg via RESPIRATORY_TRACT
  Filled 2012-07-28 (×15): qty 0.5

## 2012-07-28 MED ORDER — SODIUM CHLORIDE 0.9 % IV SOLN: 250.0000 mL | INTRAVENOUS | Status: DC | PRN

## 2012-07-28 MED ORDER — ACETAMINOPHEN 325 MG PO TABS
650.0000 mg | ORAL_TABLET | Freq: Four times a day (QID) | ORAL | Status: DC | PRN
  Administered 2012-07-28 – 2012-07-30 (×2): 650 mg via ORAL
  Filled 2012-07-28 (×2): qty 2

## 2012-07-28 MED ORDER — DIPHENHYDRAMINE HCL 50 MG/ML IJ SOLN
25.0000 mg | Freq: Once | INTRAMUSCULAR | Status: AC
  Administered 2012-07-28: 25 mg via INTRAVENOUS
  Filled 2012-07-28: qty 1

## 2012-07-28 MED ORDER — SODIUM CHLORIDE 0.9 % IJ SOLN: 3.0000 mL | INTRAMUSCULAR | Status: DC | PRN

## 2012-07-28 MED ORDER — SODIUM CHLORIDE 0.9 % IJ SOLN
3.0000 mL | Freq: Two times a day (BID) | INTRAMUSCULAR | Status: DC
  Administered 2012-07-28 – 2012-08-01 (×8): 3 mL via INTRAVENOUS

## 2012-07-28 MED ORDER — ALBUTEROL SULFATE (5 MG/ML) 0.5% IN NEBU
2.5000 mg | INHALATION_SOLUTION | RESPIRATORY_TRACT | Status: DC | PRN
Start: 2012-07-28 — End: 2012-07-28
  Administered 2012-07-28: 2.5 mg via RESPIRATORY_TRACT
  Filled 2012-07-28: qty 0.5

## 2012-07-28 MED ORDER — DEXTROSE 5 % IV SOLN
500.0000 mg | INTRAVENOUS | Status: DC
  Administered 2012-07-28: 500 mg via INTRAVENOUS
  Filled 2012-07-28: qty 500

## 2012-07-28 MED ORDER — LISINOPRIL 20 MG PO TABS
20.0000 mg | ORAL_TABLET | Freq: Two times a day (BID) | ORAL | Status: DC
  Administered 2012-07-28 – 2012-07-29 (×5): 20 mg via ORAL
  Filled 2012-07-28 (×8): qty 1

## 2012-07-28 MED ORDER — ENOXAPARIN SODIUM 40 MG/0.4ML ~~LOC~~ SOLN
40.0000 mg | SUBCUTANEOUS | Status: DC
  Administered 2012-07-28 – 2012-08-01 (×5): 40 mg via SUBCUTANEOUS
  Filled 2012-07-28 (×5): qty 0.4

## 2012-07-28 MED ORDER — GUAIFENESIN ER 600 MG PO TB12
1200.0000 mg | ORAL_TABLET | Freq: Two times a day (BID) | ORAL | Status: DC
  Administered 2012-07-28 – 2012-08-01 (×8): 1200 mg via ORAL
  Filled 2012-07-28 (×10): qty 2

## 2012-07-28 MED ORDER — DEXTROSE 5 % IV SOLN
1.0000 g | INTRAVENOUS | Status: DC
  Administered 2012-07-28 – 2012-07-31 (×4): 1 g via INTRAVENOUS
  Filled 2012-07-28 (×5): qty 10

## 2012-07-28 NOTE — Progress Notes (Signed)
[facility] PROGRESS NOTE  Ann [name] ZOX:[id number] DOB: Aug 30, 1955 DOA: 07/27/2012 PCP: No primary provider on file.  Assessment/Plan: 1. Respiratory failure/ Bil CAP -Continue current abx, supplemental oxygen, bronchodilators -add mucolytics and follow 2.HTN -Good control, continue lisinopril   Code Status: full Family Communication: directly with pt at bedside Disposition Plan: home when medicially stable   Consultants:  none  Procedures:  none  Antibiotics:  Rocephin and Zithromax, started on 1/21  HPI/Subjective: Still with cough and intermittent SOB today  Objective: Filed Vitals:   07/28/12 1105 07/28/12 1115 07/28/12 1345 07/28/12 1405  BP:    136/79  Pulse:    90  Temp:    97.5 F (36.4 C)  TempSrc:    Oral  Resp:    20  Height:      Weight:      SpO2: 91% 94% 95% 93%    Intake/Output Summary (Last 24 hours) at 07/28/12 1429 Last data filed at 07/28/12 0700  Gross per 24 hour  Intake      0 ml  Output      0 ml  Net      0 ml   Filed Weights   07/28/12 0109  Weight: 106.7 kg (235 lb 3.7 oz)    Exam:   General:  In NAD, A&Ox3  Cardiovascular: RRR, nl S1S2  Respiratory: scattered rhonchi bil, no wheezes  Abdomen: soft, +BS NT/ND  Data Reviewed: Basic Metabolic Panel:  Lab 07/28/12 8119 07/27/12 2149  NA -- 137  K -- 3.8  CL -- 103  CO2 -- 24  GLUCOSE -- 131*  BUN -- 8  CREATININE 0.72 0.66  CALCIUM -- 8.6  MG -- --  PHOS -- --   Liver Function Tests: No results found for this basename: AST:5,ALT:5,ALKPHOS:5,BILITOT:5,PROT:5,ALBUMIN:5 in the last 168 hours No results found for this basename: LIPASE:5,AMYLASE:5 in the last 168 hours No results found for this basename: AMMONIA:5 in the last 168 hours CBC:  Lab 07/28/12 0225 07/27/12 2149  WBC 7.1 5.6  NEUTROABS -- 4.1  HGB 12.1 12.7  HCT 38.1 39.6  MCV 99.0 98.0  PLT 232 224   Cardiac Enzymes: No results found for this basename:  CKTOTAL:5,CKMB:5,CKMBINDEX:5,TROPONINI:5 in the last 168 hours BNP (last 3 results)  Basename 07/27/12 2149  PROBNP 129.9*   CBG: No results found for this basename: GLUCAP:5 in the last 168 hours  No results found for this or any previous visit (from the past 240 hour(s)).   Studies: Dg Chest 2 View  07/27/2012  *RADIOLOGY REPORT*  Clinical Data: Cough and weakness.  CHEST - 2 VIEW  Comparison: 07/29/2005  Findings: Two views of the chest demonstrate patchy densities in the lungs, left side greater than right.  There is also fullness in the right paratracheal region which could be related to technique and slightly low lung volume.  Overall, the heart size is within normal limits.  No evidence for pleural effusions.  Degenerative changes in the thoracic spine.  IMPRESSION: New patchy densities in the lungs, particularly on the left side. Findings are concerning for multifocal pneumonia versus asymmetric edema.  Recommend follow up imaging to ensure resolution.  Fullness in the right paratracheal region.  Recommend attention to this area on followup imaging to exclude lymphadenopathy.   Original Report Authenticated By: Richarda Overlie, M.D.     Scheduled Meds:   . azithromycin  500 mg Intravenous Q24H  . cefTRIAXone (ROCEPHIN)  IV  1 g Intravenous Q24H  . enoxaparin (  LOVENOX) injection  40 mg Subcutaneous Q24H  . guaiFENesin  1,200 mg Oral BID  . lisinopril  20 mg Oral BID  . sodium chloride  3 mL Intravenous Q12H   Continuous Infusions:   Principal Problem:  *Respiratory failure with hypoxia Active Problems:  Hypertension  CAP (community acquired pneumonia)  Cough    Time spent:1mins    Kela Millin  [facility] Pager [phone]. If 8PM-8AM, please contact night-coverage at [email or web address], password [facility] 07/28/2012, 2:29 PM  LOS: 1 day

## 2012-07-28 NOTE — Progress Notes (Signed)
INITIAL NUTRITION ASSESSMENT  Pt meets criteria for severe MALNUTRITION in the context of acute illness as evidenced by <50% estimated energy intake with 2.5-2.9% weight loss in the past 1.5 weeks per pt report.  DOCUMENTATION CODES Per approved criteria  -Severe malnutrition in the context of acute illness or injury -Morbid Obesity   INTERVENTION: - Recommend GI consult/outpatient referral to evaluate need for pt to get esophagus stretched again as this is limiting her oral intake - Encouraged pt to eat slowly and chew food thoroughly  - Will continue to monitor  NUTRITION DIAGNOSIS: Inadequate oral intake related to poor appetite from congestion and dysphagia as evidenced by pt statement.   Goal: Pt able to safely consume >90% of meals  Monitor:  Weights, labs, intake  Reason for Assessment: Nutrition risk   57 y.o. female  Admitting Dx: Respiratory failure with hypoxia  ASSESSMENT: Pt reports poor intake for the past 1.5 weeks because of congestion and worsening dysphagia. Pt with esophageal strictures and reports she got her esophagus stretched in 2008 or 2009 by Dr. Dickie La and she states she needs it stretched again. Pt reports water was getting "hung" in her throat this morning. Pt reports she got eggs this morning but had to eat it extremely slowly for it to go down. Pt denies need for nutritional supplements.   Height: Ht Readings from Last 1 Encounters:  07/28/12 5\' 4"  (1.626 m)    Weight: Wt Readings from Last 1 Encounters:  07/28/12 235 lb 3.7 oz (106.7 kg)    Ideal Body Weight: 120 lb  % Ideal Body Weight: 196  Wt Readings from Last 10 Encounters:  07/28/12 235 lb 3.7 oz (106.7 kg)    Usual Body Weight: 241-242 lb  % Usual Body Weight: 97  BMI:  Body mass index is 40.38 kg/(m^2).  Estimated Nutritional Needs: Kcal: 1350-1650 Protein: 55-65g Fluid: 1.3-1.6L/day   Diet Order: Cardiac  EDUCATION NEEDS: -No education needs identified at this  time   Intake/Output Summary (Last 24 hours) at 07/28/12 1350 Last data filed at 07/28/12 0700  Gross per 24 hour  Intake      0 ml  Output      0 ml  Net      0 ml    Last BM: 1/21  Labs:   Lab 07/28/12 0225 07/27/12 2149  NA -- 137  K -- 3.8  CL -- 103  CO2 -- 24  BUN -- 8  CREATININE 0.72 0.66  CALCIUM -- 8.6  MG -- --  PHOS -- --  GLUCOSE -- 131*    CBG (last 3)  No results found for this basename: GLUCAP:3 in the last 72 hours  Scheduled Meds:   . azithromycin  500 mg Intravenous Q24H  . cefTRIAXone (ROCEPHIN)  IV  1 g Intravenous Q24H  . enoxaparin (LOVENOX) injection  40 mg Subcutaneous Q24H  . lisinopril  20 mg Oral BID  . sodium chloride  3 mL Intravenous Q12H    Continuous Infusions:   Past Medical History  Diagnosis Date  . Apnea   . Hypertension   . Diverticulitis   . Stroke   . Arthritis   . Hiatal hernia   . Esophageal stricture     Past Surgical History  Procedure Date  . Tubal ligation   . Knee arthroscopy     Levon Hedger MS, RD, LDN [phone] Pager [phone] After Hours Pager

## 2012-07-28 NOTE — ED Notes (Signed)
Blood cultures x 1 by Gertie Baron at original time of blood draw for all labs

## 2012-07-29 LAB — STREP PNEUMONIAE URINARY ANTIGEN: Strep Pneumo Urinary Antigen: NEGATIVE

## 2012-07-29 MED ORDER — MENTHOL 3 MG MT LOZG
1.0000 | LOZENGE | OROMUCOSAL | Status: DC | PRN
  Administered 2012-07-29 – 2012-07-31 (×5): 3 mg via ORAL
  Filled 2012-07-29: qty 9

## 2012-07-29 MED ORDER — PREDNISONE 20 MG PO TABS
40.0000 mg | ORAL_TABLET | Freq: Every day | ORAL | Status: DC
  Administered 2012-07-30 – 2012-08-01 (×3): 40 mg via ORAL
  Filled 2012-07-29 (×4): qty 2

## 2012-07-29 MED ORDER — PANTOPRAZOLE SODIUM 40 MG PO TBEC
40.0000 mg | DELAYED_RELEASE_TABLET | Freq: Two times a day (BID) | ORAL | Status: DC
  Administered 2012-07-29 – 2012-08-01 (×7): 40 mg via ORAL
  Filled 2012-07-29 (×8): qty 1

## 2012-07-29 MED ORDER — HYDROCODONE-ACETAMINOPHEN 5-325 MG PO TABS
1.0000 | ORAL_TABLET | Freq: Four times a day (QID) | ORAL | Status: DC | PRN
  Administered 2012-07-29: 2 via ORAL
  Administered 2012-07-29 (×2): 1 via ORAL
  Administered 2012-07-30 – 2012-08-01 (×6): 2 via ORAL
  Filled 2012-07-29 (×6): qty 2
  Filled 2012-07-29 (×2): qty 1
  Filled 2012-07-29: qty 2

## 2012-07-29 MED ORDER — AZITHROMYCIN 500 MG PO TABS
500.0000 mg | ORAL_TABLET | ORAL | Status: DC
  Administered 2012-07-29 – 2012-07-31 (×3): 500 mg via ORAL
  Filled 2012-07-29 (×5): qty 1

## 2012-07-29 MED ORDER — METHYLPREDNISOLONE SODIUM SUCC 40 MG IJ SOLR
40.0000 mg | Freq: Four times a day (QID) | INTRAMUSCULAR | Status: AC
  Administered 2012-07-29 (×2): 40 mg via INTRAVENOUS
  Filled 2012-07-29 (×2): qty 1

## 2012-07-29 NOTE — Progress Notes (Signed)
PHARMACIST - PHYSICIAN COMMUNICATION CONCERNING: Antibiotic IV to Oral Route Change Policy  RECOMMENDATION: This patient is receiving Azithromycin by the intravenous route.  Based on criteria approved by the Pharmacy and Therapeutics Committee, the antibiotic(s) is/are being converted to the equivalent oral dose form(s).   DESCRIPTION: These criteria include:  Patient being treated for a respiratory tract infection, urinary tract infection, or cellulitis  The patient is not neutropenic and does not exhibit a GI malabsorption state  The patient is eating (either orally or via tube) and/or has been taking other orally administered medications for a least 24 hours  The patient is improving clinically and has a Tmax < 100.5  If you have questions about this conversion, please contact the Pharmacy Department  []   [phone] )  [facility] []   [phone] )  [facility]  []   [phone] )  [facility] [x]   [phone] )  [facility]   Geoffry Paradise, PharmD, BCPS Pager: [phone] 10:46 AM Pharmacy #: [phone]

## 2012-07-29 NOTE — Progress Notes (Signed)
[facility] PROGRESS NOTE  Ann [name] ZOX:[id number] DOB: 1955/10/15 DOA: 07/27/2012 PCP: No primary provider on file.  Assessment/Plan: 1. Respiratory failure/ Bil CAP with brochospasm -Continue current abx, supplemental oxygen, bronchodilators -continue mucolytics and follow -will add steroids and follow  2.HTN -ok  control, continue lisinopril  CM consulted for PCP   Code Status: full Family Communication: directly with pt at bedside Disposition Plan: home when medicially stable   Consultants:  none  Procedures:  none  Antibiotics:  Rocephin and Zithromax, started on 1/21  HPI/Subjective: cough about the same, but overall breathing better  Objective: Filed Vitals:   07/29/12 0600 07/29/12 0827 07/29/12 0929 07/29/12 1331  BP: 147/94  137/84 145/83  Pulse: 67   73  Temp: 98 F (36.7 C)   98.2 F (36.8 C)  TempSrc: Oral   Oral  Resp: 20   20  Height:      Weight:      SpO2: 95% 95%  94%    Intake/Output Summary (Last 24 hours) at 07/29/12 1353 Last data filed at 07/29/12 1332  Gross per 24 hour  Intake    540 ml  Output      0 ml  Net    540 ml   Filed Weights   07/28/12 0109  Weight: 106.7 kg (235 lb 3.7 oz)    Exam:   General:  In NAD, A&Ox3  Cardiovascular: RRR, nl S1S2  Respiratory: bil exp wheezes, rhonchi present. No wheezes.  Abdomen: soft, +BS NT/ND  Data Reviewed: Basic Metabolic Panel:  Lab 07/28/12 8119 07/27/12 2149  NA -- 137  K -- 3.8  CL -- 103  CO2 -- 24  GLUCOSE -- 131*  BUN -- 8  CREATININE 0.72 0.66  CALCIUM -- 8.6  MG -- --  PHOS -- --   Liver Function Tests: No results found for this basename: AST:5,ALT:5,ALKPHOS:5,BILITOT:5,PROT:5,ALBUMIN:5 in the last 168 hours No results found for this basename: LIPASE:5,AMYLASE:5 in the last 168 hours No results found for this basename: AMMONIA:5 in the last 168 hours CBC:  Lab 07/28/12 0225 07/27/12 2149  WBC 7.1 5.6  NEUTROABS -- 4.1  HGB 12.1 12.7    HCT 38.1 39.6  MCV 99.0 98.0  PLT 232 224   Cardiac Enzymes: No results found for this basename: CKTOTAL:5,CKMB:5,CKMBINDEX:5,TROPONINI:5 in the last 168 hours BNP (last 3 results)  Basename 07/27/12 2149  PROBNP 129.9*   CBG: No results found for this basename: GLUCAP:5 in the last 168 hours  Recent Results (from the past 240 hour(s))  CULTURE, BLOOD (ROUTINE X 2)     Status: Normal (Preliminary result)   Collection Time   07/28/12 12:00 AM      Component Value Range Status Comment   Specimen Description BLOOD LEFT ARM   Final    Special Requests BOTTLES DRAWN AEROBIC ONLY 2CC   Final    Culture  Setup Time 07/28/2012 12:04   Final    Culture     Final    Value:        BLOOD CULTURE RECEIVED NO GROWTH TO DATE CULTURE WILL BE HELD FOR 5 DAYS BEFORE ISSUING A FINAL NEGATIVE REPORT   Report Status PENDING   Incomplete   CULTURE, BLOOD (ROUTINE X 2)     Status: Normal (Preliminary result)   Collection Time   07/28/12  2:25 AM      Component Value Range Status Comment   Specimen Description BLOOD LEFT ARM   Final  Special Requests BOTTLES DRAWN AEROBIC AND ANAEROBIC 4CC AND 2CC   Final    Culture  Setup Time 07/28/2012 12:04   Final    Culture     Final    Value:        BLOOD CULTURE RECEIVED NO GROWTH TO DATE CULTURE WILL BE HELD FOR 5 DAYS BEFORE ISSUING A FINAL NEGATIVE REPORT   Report Status PENDING   Incomplete      Studies: Dg Chest 2 View  07/27/2012  *RADIOLOGY REPORT*  Clinical Data: Cough and weakness.  CHEST - 2 VIEW  Comparison: 07/29/2005  Findings: Two views of the chest demonstrate patchy densities in the lungs, left side greater than right.  There is also fullness in the right paratracheal region which could be related to technique and slightly low lung volume.  Overall, the heart size is within normal limits.  No evidence for pleural effusions.  Degenerative changes in the thoracic spine.  IMPRESSION: New patchy densities in the lungs, particularly on the left  side. Findings are concerning for multifocal pneumonia versus asymmetric edema.  Recommend follow up imaging to ensure resolution.  Fullness in the right paratracheal region.  Recommend attention to this area on followup imaging to exclude lymphadenopathy.   Original Report Authenticated By: Richarda Overlie, M.D.     Scheduled Meds:    . albuterol  2.5 mg Nebulization Q6H  . azithromycin  500 mg Oral Q24 Hr x 5  . cefTRIAXone (ROCEPHIN)  IV  1 g Intravenous Q24H  . enoxaparin (LOVENOX) injection  40 mg Subcutaneous Q24H  . guaiFENesin  1,200 mg Oral BID  . lisinopril  20 mg Oral BID  . methylPREDNISolone (SOLU-MEDROL) injection  40 mg Intravenous Q6H  . pantoprazole  40 mg Oral BID AC  . predniSONE  40 mg Oral Q breakfast  . sodium chloride  3 mL Intravenous Q12H   Continuous Infusions:   Principal Problem:  *Respiratory failure with hypoxia Active Problems:  Hypertension  CAP (community acquired pneumonia)  Cough    Time spent:43mins    Kela Millin  [facility] Pager [phone]. If 8PM-8AM, please contact night-coverage at [email or web address], password [facility] 07/29/2012, 1:53 PM  LOS: 2 days

## 2012-07-29 NOTE — Progress Notes (Signed)
Patient complains of chest and rib pain when she coughs.  Dr. Donna Bernard aware.  Patient ordered vicodin for pain and cepacol losenges for sore throat.  Will continue to monitor.

## 2012-07-29 NOTE — ED Provider Notes (Signed)
Medical screening examination/treatment/procedure(s) were performed by non-physician practitioner and as supervising physician I was immediately available for consultation/collaboration.  Derald Lorge T Kallee Nam, MD 07/29/12 1328 

## 2012-07-30 LAB — LEGIONELLA ANTIGEN, URINE: Legionella Antigen, Urine: NEGATIVE

## 2012-07-30 LAB — BASIC METABOLIC PANEL
Chloride: 100 mEq/L (ref 96–112)
GFR calc Af Amer: >90 mL/min (ref >90)
Potassium: 5.4 mEq/L — ABNORMAL HIGH (ref 3.5–5.1)

## 2012-07-30 MED ORDER — LISINOPRIL 20 MG PO TABS
20.0000 mg | ORAL_TABLET | Freq: Every day | ORAL | Status: DC
Start: 2012-07-30 — End: 2012-08-01
  Administered 2012-07-30 – 2012-08-01 (×3): 20 mg via ORAL
  Filled 2012-07-30 (×3): qty 1

## 2012-07-30 MED ORDER — IPRATROPIUM BROMIDE 0.02 % IN SOLN
0.5000 mg | Freq: Four times a day (QID) | RESPIRATORY_TRACT | Status: DC
  Administered 2012-07-30 – 2012-08-01 (×8): 0.5 mg via RESPIRATORY_TRACT
  Filled 2012-07-30 (×9): qty 2.5

## 2012-07-30 NOTE — Progress Notes (Signed)
[facility] PROGRESS NOTE  Ann [name] ZOX:[id number] DOB: 06-Oct-1955 DOA: 07/27/2012 PCP: No primary provider on file.  Assessment/Plan: 1. Respiratory failure/ Bil CAP with brochospasm -Continue current abx, supplemental oxygen, bronchodilators -continue mucolytics and follow -continue steroids and follow -mobilize 2.HTN -ok  control, continue lisinopril 3. Mild hyperkalemia -decrease lsiniopril, follow and recheck    Code Status: full Family Communication: directly with pt at bedside Disposition Plan: home when medically stable   Consultants:  none  Procedures:  none  Antibiotics:  Rocephin and Zithromax, started on 1/21  HPI/Subjective: States 'still not coughing up anything', decreased SOB  Objective: Filed Vitals:   07/29/12 2151 07/30/12 0303 07/30/12 0550 07/30/12 0753  BP: 138/92  140/83   Pulse:   69   Temp:   98.1 F (36.7 C)   TempSrc:   Oral   Resp:   20   Height:      Weight:      SpO2:  94% 94% 92%    Intake/Output Summary (Last 24 hours) at 07/30/12 1429 Last data filed at 07/30/12 8119  Gross per 24 hour  Intake    290 ml  Output      0 ml  Net    290 ml   Filed Weights   07/28/12 0109  Weight: 106.7 kg (235 lb 3.7 oz)    Exam:   General:  In NAD, A&Ox3  Cardiovascular: RRR, nl S1S2  Respiratory:  decreasing bil exp wheezes, rhonchi present. No wheezes.  Abdomen: soft, +BS NT/ND  Data Reviewed: Basic Metabolic Panel:  Lab 07/30/12 1478 07/28/12 0225 07/27/12 2149  NA 137 -- 137  K 5.4* -- 3.8  CL 100 -- 103  CO2 30 -- 24  GLUCOSE 158* -- 131*  BUN 13 -- 8  CREATININE 0.68 0.72 0.66  CALCIUM 9.0 -- 8.6  MG -- -- --  PHOS -- -- --   Liver Function Tests: No results found for this basename: AST:5,ALT:5,ALKPHOS:5,BILITOT:5,PROT:5,ALBUMIN:5 in the last 168 hours No results found for this basename: LIPASE:5,AMYLASE:5 in the last 168 hours No results found for this basename: AMMONIA:5 in the last 168  hours CBC:  Lab 07/28/12 0225 07/27/12 2149  WBC 7.1 5.6  NEUTROABS -- 4.1  HGB 12.1 12.7  HCT 38.1 39.6  MCV 99.0 98.0  PLT 232 224   Cardiac Enzymes: No results found for this basename: CKTOTAL:5,CKMB:5,CKMBINDEX:5,TROPONINI:5 in the last 168 hours BNP (last 3 results)  Basename 07/27/12 2149  PROBNP 129.9*   CBG: No results found for this basename: GLUCAP:5 in the last 168 hours  Recent Results (from the past 240 hour(s))  CULTURE, BLOOD (ROUTINE X 2)     Status: Normal (Preliminary result)   Collection Time   07/28/12 12:00 AM      Component Value Range Status Comment   Specimen Description BLOOD LEFT ARM   Final    Special Requests BOTTLES DRAWN AEROBIC ONLY 2CC   Final    Culture  Setup Time 07/28/2012 12:04   Final    Culture     Final    Value:        BLOOD CULTURE RECEIVED NO GROWTH TO DATE CULTURE WILL BE HELD FOR 5 DAYS BEFORE ISSUING A FINAL NEGATIVE REPORT   Report Status PENDING   Incomplete   CULTURE, BLOOD (ROUTINE X 2)     Status: Normal (Preliminary result)   Collection Time   07/28/12  2:25 AM      Component Value Range Status Comment  Specimen Description BLOOD LEFT ARM   Final    Special Requests BOTTLES DRAWN AEROBIC AND ANAEROBIC 4CC AND 2CC   Final    Culture  Setup Time 07/28/2012 12:04   Final    Culture     Final    Value:        BLOOD CULTURE RECEIVED NO GROWTH TO DATE CULTURE WILL BE HELD FOR 5 DAYS BEFORE ISSUING A FINAL NEGATIVE REPORT   Report Status PENDING   Incomplete      Studies: No results found.  Scheduled Meds:    . albuterol  2.5 mg Nebulization Q6H  . azithromycin  500 mg Oral Q24 Hr x 5  . cefTRIAXone (ROCEPHIN)  IV  1 g Intravenous Q24H  . enoxaparin (LOVENOX) injection  40 mg Subcutaneous Q24H  . guaiFENesin  1,200 mg Oral BID  . ipratropium  0.5 mg Nebulization Q6H  . lisinopril  20 mg Oral Daily  . pantoprazole  40 mg Oral BID  . predniSONE  40 mg Oral Q breakfast  . sodium chloride  3 mL Intravenous Q12H    Continuous Infusions:   Principal Problem:  *Respiratory failure with hypoxia Active Problems:  Hypertension  CAP (community acquired pneumonia)  Cough    Time spent:74mins    Kela Millin  [facility] Pager [phone]. If 8PM-8AM, please contact night-coverage at [email or web address], password [facility] 07/30/2012, 2:29 PM  LOS: 3 days

## 2012-07-31 LAB — BASIC METABOLIC PANEL
CO2: 28 mEq/L (ref 19–32)
Chloride: 98 mEq/L (ref 96–112)
Potassium: 3.7 mEq/L (ref 3.5–5.1)
Sodium: 136 mEq/L (ref 135–145)

## 2012-07-31 MED ORDER — METHYLPREDNISOLONE SODIUM SUCC 125 MG IJ SOLR
80.0000 mg | Freq: Once | INTRAMUSCULAR | Status: AC
  Administered 2012-07-31: 80 mg via INTRAVENOUS
  Filled 2012-07-31: qty 1.28

## 2012-07-31 MED ORDER — BENZONATATE 100 MG PO CAPS
200.0000 mg | ORAL_CAPSULE | Freq: Three times a day (TID) | ORAL | Status: DC
  Administered 2012-07-31 – 2012-08-01 (×3): 200 mg via ORAL
  Filled 2012-07-31 (×5): qty 2

## 2012-07-31 NOTE — Progress Notes (Signed)
[facility] PROGRESS NOTE  Ann [name] ZOX:[id number] DOB: [birth year] DOA: 07/27/2012 PCP: No primary provider on file.  Assessment/Plan: 1. Respiratory failure/ Bil CAP with brochospasm -Continue current abx, supplemental oxygen, bronchodilators -add antitussives, continue mucolytics and follow -continue steroids and follow -mobilize 2.HTN -good  control, continue current lisinopril 3. Mild hyperkalemia -resolved    Code Status: full Family Communication: directly with pt at bedside Disposition Plan: home when medically stable   Consultants:  none  Procedures:  none  Antibiotics:  Rocephin and Zithromax, started on 1/21  HPI/Subjective: Still with increased cough and states cough spells go on sometimes till feels dizzy  Objective: Filed Vitals:   07/30/12 2118 07/31/12 0152 07/31/12 0605 07/31/12 0946  BP: 146/83  127/81   Pulse: 74  67   Temp: 97.8 F (36.6 C)  98.2 F (36.8 C)   TempSrc: Oral  Oral   Resp: 20  18   Height:      Weight:      SpO2: 94% 99% 99% 98%    Intake/Output Summary (Last 24 hours) at 07/31/12 1222 Last data filed at 07/31/12 0727  Gross per 24 hour  Intake    240 ml  Output      0 ml  Net    240 ml   Filed Weights   07/28/12 0109  Weight: 106.7 kg (235 lb 3.7 oz)    Exam:   General:  In NAD, A&Ox3  Cardiovascular: RRR, nl S1S2  Respiratory:  decreasing bil exp wheezes, rhonchi present.  Abdomen: soft, +BS NT/ND  Data Reviewed: Basic Metabolic Panel:  Lab 07/31/12 8119 07/30/12 0519 07/28/12 0225 07/27/12 2149  NA 136 137 -- 137  K 3.7 5.4* -- 3.8  CL 98 100 -- 103  CO2 28 30 -- 24  GLUCOSE 109* 158* -- 131*  BUN 15 13 -- 8  CREATININE 0.71 0.68 0.72 0.66  CALCIUM 8.6 9.0 -- 8.6  MG -- -- -- --  PHOS -- -- -- --   Liver Function Tests: No results found for this basename: AST:5,ALT:5,ALKPHOS:5,BILITOT:5,PROT:5,ALBUMIN:5 in the last 168 hours No results found for this basename: LIPASE:5,AMYLASE:5  in the last 168 hours No results found for this basename: AMMONIA:5 in the last 168 hours CBC:  Lab 07/28/12 0225 07/27/12 2149  WBC 7.1 5.6  NEUTROABS -- 4.1  HGB 12.1 12.7  HCT 38.1 39.6  MCV 99.0 98.0  PLT 232 224   Cardiac Enzymes: No results found for this basename: CKTOTAL:5,CKMB:5,CKMBINDEX:5,TROPONINI:5 in the last 168 hours BNP (last 3 results)  Basename 07/27/12 2149  PROBNP 129.9*   CBG: No results found for this basename: GLUCAP:5 in the last 168 hours  Recent Results (from the past 240 hour(s))  CULTURE, BLOOD (ROUTINE X 2)     Status: Normal (Preliminary result)   Collection Time   07/28/12 12:00 AM      Component Value Range Status Comment   Specimen Description BLOOD LEFT ARM   Final    Special Requests BOTTLES DRAWN AEROBIC ONLY 2CC   Final    Culture  Setup Time 07/28/2012 12:04   Final    Culture     Final    Value:        BLOOD CULTURE RECEIVED NO GROWTH TO DATE CULTURE WILL BE HELD FOR 5 DAYS BEFORE ISSUING A FINAL NEGATIVE REPORT   Report Status PENDING   Incomplete   CULTURE, BLOOD (ROUTINE X 2)     Status: Normal (Preliminary result)  Collection Time   07/28/12  2:25 AM      Component Value Range Status Comment   Specimen Description BLOOD LEFT ARM   Final    Special Requests BOTTLES DRAWN AEROBIC AND ANAEROBIC 4CC AND 2CC   Final    Culture  Setup Time 07/28/2012 12:04   Final    Culture     Final    Value:        BLOOD CULTURE RECEIVED NO GROWTH TO DATE CULTURE WILL BE HELD FOR 5 DAYS BEFORE ISSUING A FINAL NEGATIVE REPORT   Report Status PENDING   Incomplete      Studies: No results found.  Scheduled Meds:    . albuterol  2.5 mg Nebulization Q6H  . azithromycin  500 mg Oral Q24 Hr x 5  . cefTRIAXone (ROCEPHIN)  IV  1 g Intravenous Q24H  . enoxaparin (LOVENOX) injection  40 mg Subcutaneous Q24H  . guaiFENesin  1,200 mg Oral BID  . ipratropium  0.5 mg Nebulization Q6H  . lisinopril  20 mg Oral Daily  . pantoprazole  40 mg Oral BID  .  predniSONE  40 mg Oral Q breakfast  . sodium chloride  3 mL Intravenous Q12H   Continuous Infusions:   Principal Problem:  *Respiratory failure with hypoxia Active Problems:  Hypertension  CAP (community acquired pneumonia)  Cough    Time spent:35mins    Kela Millin  [facility] Pager [phone]. If 8PM-8AM, please contact night-coverage at [email or web address], password [facility] 07/31/2012, 12:22 PM  LOS: 4 days

## 2012-07-31 NOTE — Progress Notes (Signed)
Pt ambulated about 50 feet from room to nursing station and back to room. sats prior to ambulation was 93% ra. Pt became tachycardic in the beginning of ambulation and then brady at as low as 49 in the middle after about 25 feet.  Pt started to cough and  complained of dizziness and not feeling good when bradycardic and asked to sit down. sats dropped to 84% ra.  Pt went back to and upon sitting sats were back up to 93% ra. Vwilliams,rn.

## 2012-08-01 MED ORDER — GUAIFENESIN ER 600 MG PO TB12: 1200.0000 mg | ORAL_TABLET | Freq: Two times a day (BID) | ORAL | Status: DC

## 2012-08-01 MED ORDER — GUAIFENESIN 100 MG/5ML PO SOLN: 200.0000 mg | ORAL | Status: DC | PRN

## 2012-08-01 MED ORDER — MOXIFLOXACIN HCL 400 MG PO TABS: 400.0000 mg | ORAL_TABLET | Freq: Every day | ORAL | Status: DC

## 2012-08-01 MED ORDER — IPRATROPIUM-ALBUTEROL 18-103 MCG/ACT IN AERO: 2.0000 | INHALATION_SPRAY | Freq: Four times a day (QID) | RESPIRATORY_TRACT | Status: DC

## 2012-08-01 MED ORDER — ALBUTEROL SULFATE HFA 108 (90 BASE) MCG/ACT IN AERS: 2.0000 | INHALATION_SPRAY | RESPIRATORY_TRACT | Status: DC | PRN

## 2012-08-01 MED ORDER — OMEPRAZOLE 40 MG PO CPDR: 40.0000 mg | DELAYED_RELEASE_CAPSULE | Freq: Every day | ORAL | Status: DC

## 2012-08-01 MED ORDER — HYDROCODONE-ACETAMINOPHEN 5-325 MG PO TABS: 1.0000 | ORAL_TABLET | Freq: Four times a day (QID) | ORAL | Status: DC | PRN

## 2012-08-01 MED ORDER — PREDNISONE 20 MG PO TABS: 40.0000 mg | ORAL_TABLET | Freq: Every day | ORAL | Status: DC

## 2012-08-01 NOTE — Discharge Summary (Signed)
Physician Discharge Summary  DORALENE GLANZ ZOX:[id number] DOB: March 14, 1956 DOA: 07/27/2012  PCP: No primary provider on file.  Admit date: 07/27/2012 Discharge date: 08/01/2012  Time spent: 30 minutes  Recommendations for Outpatient Follow-up:      Follow-up Information    Please follow up. (PCP, in one to 2 weeks, call for appointment upon discharge. Need to have pulmonary function tests set up with PCP outpatient)          Discharge Diagnoses:  Principal Problem:  *Respiratory failure with hypoxia Active Problems:  Hypertension  CAP (community acquired pneumonia)  Cough  obstructive sleep apnea Significant Secondhand smoke exposure Discharge Condition: Improved/stable  Diet recommendation: 2 g sodium heart healthy  Filed Weights   07/28/12 0109  Weight: 106.7 kg (235 lb 3.7 oz)    History of present illness:  The patient is a 57 yo female with cough and wheezing for over one week. No prior lung disease or h/o asthma. Nonsmoker but husband smokes a lot in home. Several days of worsening sob and wheezing with fever. Found to have bilateral pna on cxr. Gets pna one to 2 times a year. Wheezes mult times a month. Never been evaluated. No n/v. No dysuria. No cp, abd pain   Hospital Course:  1. Respiratory failure/ Bil CAP with brochospasm -Upon admission she was placed on empiric antibiotics with Rocephin and Zithromax. Blood cultures were obtained prior to antibiotics and to date showed no growth.she was also placed on nebulized bronchodilators supplemental oxygen and antitussives/mucolytics. Her improvement was very gradual and so on steroids which added as an element of bronchospasm was persistent. She continued to improve and her oxygenation is improved as well, she has remained afebrile and hemodynamically stable with no leukocytosis. -She was mobilized, her ambulatory O2 sats today do not drop less than 88%, she is to follow up outpatient with her PCP. -She be discharged on  oral antibiotics mucolytics and bronchodilators. -She has had significant secondhand exposure and I have recommended that she follows up outpatient with her PCP to be referred to have pulmonary function tests done to evaluate for possible COPD. 2.HTN  -good control, she was maintained on lisinopril which she is to continue upon discharge. 3. Mild hyperkalemia  -resolved, she is to follow up with PCP and if recurrent lisinopril dose may need to be decreased or discontinued. History of OSA -Continue CPAP outpatient Procedures:  none  Consultations:  none  Discharge Exam: Filed Vitals:   08/01/12 0621 08/01/12 0900 08/01/12 0920 08/01/12 1211  BP: 145/85  115/69   Pulse: 58     Temp: 97.5 F (36.4 C)     TempSrc: Oral     Resp: 18     Height:      Weight:      SpO2: 93% 95%  95%    Exam:  General: In NAD, A&Ox3  Cardiovascular: RRR, nl S1S2  Respiratory: few bil exp wheezes, scattered rhonchi present.  Abdomen: soft, +BS NT/ND   Discharge Instructions  Discharge Orders    Future Appointments: Provider: Department: Dept Phone: Center:   08/04/2012 1:30 PM Hart Carwin, MD [facility] [phone] [facility]       Medication List     As of 08/01/2012 12:30 PM    TAKE these medications         acetaminophen 325 MG tablet   Commonly known as: TYLENOL   Take 650 mg by mouth every 6 (six) hours as needed.  albuterol 108 (90 BASE) MCG/ACT inhaler   Commonly known as: PROVENTIL HFA;VENTOLIN HFA   Inhale 2 puffs into the lungs every 4 (four) hours as needed for wheezing.      albuterol-ipratropium 18-103 MCG/ACT inhaler   Commonly known as: COMBIVENT   Inhale 2 puffs into the lungs 4 (four) times daily.      aspirin EC 81 MG tablet   Take 81 mg by mouth every evening.      CORICIDIN D PO   Take 1 tablet by mouth 2 (two) times daily as needed. Flu-like symptoms / cough      guaiFENesin 600 MG 12 hr tablet   Commonly known as: MUCINEX    Take 2 tablets (1,200 mg total) by mouth 2 (two) times daily.      guaiFENesin 100 MG/5ML Soln   Commonly known as: ROBITUSSIN   Take 10 mLs (200 mg total) by mouth every 4 (four) hours as needed.      HYDROcodone-acetaminophen 5-325 MG per tablet   Commonly known as: NORCO/VICODIN   Take 1-2 tablets by mouth every 6 (six) hours as needed.      ibuprofen 200 MG tablet   Commonly known as: ADVIL,MOTRIN   Take 200 mg by mouth every 6 (six) hours as needed. For pain or headache      lisinopril 20 MG tablet   Commonly known as: PRINIVIL,ZESTRIL   Take 20 mg by mouth 2 (two) times daily.      moxifloxacin 400 MG tablet   Commonly known as: AVELOX   Take 1 tablet (400 mg total) by mouth daily.      omeprazole 40 MG capsule   Commonly known as: PRILOSEC   Take 1 capsule (40 mg total) by mouth daily.      predniSONE 20 MG tablet   Commonly known as: DELTASONE   Take 2 tablets (40 mg total) by mouth daily with breakfast.           Follow-up Information    Please follow up. (PCP, in one to 2 weeks, call for appointment upon discharge. Need to have pulmonary function tests set up with PCP outpatient)           The results of significant diagnostics from this hospitalization (including imaging, microbiology, ancillary and laboratory) are listed below for reference.    Significant Diagnostic Studies: Dg Chest 2 View  07/27/2012  *RADIOLOGY REPORT*  Clinical Data: Cough and weakness.  CHEST - 2 VIEW  Comparison: 07/29/2005  Findings: Two views of the chest demonstrate patchy densities in the lungs, left side greater than right.  There is also fullness in the right paratracheal region which could be related to technique and slightly low lung volume.  Overall, the heart size is within normal limits.  No evidence for pleural effusions.  Degenerative changes in the thoracic spine.  IMPRESSION: New patchy densities in the lungs, particularly on the left side. Findings are concerning for  multifocal pneumonia versus asymmetric edema.  Recommend follow up imaging to ensure resolution.  Fullness in the right paratracheal region.  Recommend attention to this area on followup imaging to exclude lymphadenopathy.   Original Report Authenticated By: Richarda Overlie, M.D.     Microbiology: Recent Results (from the past 240 hour(s))  CULTURE, BLOOD (ROUTINE X 2)     Status: Normal (Preliminary result)   Collection Time   07/28/12 12:00 AM      Component Value Range Status Comment   Specimen Description BLOOD LEFT  ARM   Final    Special Requests BOTTLES DRAWN AEROBIC ONLY 2CC   Final    Culture  Setup Time 07/28/2012 12:04   Final    Culture     Final    Value:        BLOOD CULTURE RECEIVED NO GROWTH TO DATE CULTURE WILL BE HELD FOR 5 DAYS BEFORE ISSUING A FINAL NEGATIVE REPORT   Report Status PENDING   Incomplete   CULTURE, BLOOD (ROUTINE X 2)     Status: Normal (Preliminary result)   Collection Time   07/28/12  2:25 AM      Component Value Range Status Comment   Specimen Description BLOOD LEFT ARM   Final    Special Requests BOTTLES DRAWN AEROBIC AND ANAEROBIC 4CC AND 2CC   Final    Culture  Setup Time 07/28/2012 12:04   Final    Culture     Final    Value:        BLOOD CULTURE RECEIVED NO GROWTH TO DATE CULTURE WILL BE HELD FOR 5 DAYS BEFORE ISSUING A FINAL NEGATIVE REPORT   Report Status PENDING   Incomplete      Labs: Basic Metabolic Panel:  Lab 07/31/12 8469 07/30/12 0519 07/28/12 0225 07/27/12 2149  NA 136 137 -- 137  K 3.7 5.4* -- 3.8  CL 98 100 -- 103  CO2 28 30 -- 24  GLUCOSE 109* 158* -- 131*  BUN 15 13 -- 8  CREATININE 0.71 0.68 0.72 0.66  CALCIUM 8.6 9.0 -- 8.6  MG -- -- -- --  PHOS -- -- -- --   Liver Function Tests: No results found for this basename: AST:5,ALT:5,ALKPHOS:5,BILITOT:5,PROT:5,ALBUMIN:5 in the last 168 hours No results found for this basename: LIPASE:5,AMYLASE:5 in the last 168 hours No results found for this basename: AMMONIA:5 in the last 168  hours CBC:  Lab 07/28/12 0225 07/27/12 2149  WBC 7.1 5.6  NEUTROABS -- 4.1  HGB 12.1 12.7  HCT 38.1 39.6  MCV 99.0 98.0  PLT 232 224   Cardiac Enzymes: No results found for this basename: CKTOTAL:5,CKMB:5,CKMBINDEX:5,TROPONINI:5 in the last 168 hours BNP: BNP (last 3 results)  Basename 07/27/12 2149  PROBNP 129.9*   CBG: No results found for this basename: GLUCAP:5 in the last 168 hours     Signed:  Kela Millin  [facility] 08/01/2012, 12:30 PM

## 2012-08-03 LAB — CULTURE, BLOOD (ROUTINE X 2): Culture: NO GROWTH

## 2012-08-04 ENCOUNTER — Ambulatory Visit (INDEPENDENT_AMBULATORY_CARE_PROVIDER_SITE_OTHER): Payer: Medicaid Other | Admitting: Internal Medicine

## 2012-08-04 ENCOUNTER — Encounter: Payer: Self-pay | Admitting: Internal Medicine

## 2012-08-04 VITALS — BP 142/80 | HR 86 | Ht 64.0 in | Wt 231.0 lb

## 2012-08-04 DIAGNOSIS — R1310 Dysphagia, unspecified: Secondary | ICD-10-CM

## 2012-08-04 DIAGNOSIS — K625 Hemorrhage of anus and rectum: Secondary | ICD-10-CM

## 2012-08-04 MED ORDER — HYDROCORTISONE ACETATE 25 MG RE SUPP: 25.0000 mg | Freq: Every day | RECTAL | Status: DC

## 2012-08-04 NOTE — Progress Notes (Signed)
HELI [name] 04-21-1956 MRN [id number]  History of Present Illness:  This is a 57 year old white female who was hospitalized for acute respiratory failure and discharge 3 days ago. She is still coughing and finishing her antibiotics. We suspects aspiration. She has a history of esophageal stricture which was dilated by me in November 2006. She since then has had recurrence of solid food dysphagia which has become more severe recently. A barium esophagram in 2006 showed a 3 cm hiatal hernia and esophageal stricture. The motility was normal. She also had moderate reflux. Another problem has been bright red blood per rectum with rectal pain associated with bowel movements. In the hospital, patient was having diarrhea due to antibiotics but she is now constipated while taking hydrocodone. She has never had a colonoscopy and there is no family history of colon cancer. Her sister died of sleep apnea during a gastric bypass surgery at age 56.   Past Medical History  Diagnosis Date  . Apnea   . Hypertension   . Diverticulitis   . Stroke   . Arthritis   . Hiatal hernia   . Esophageal stricture    Past Surgical History  Procedure Date  . Tubal ligation   . Knee arthroscopy     x 2    reports that she has never smoked. She has never used smokeless tobacco. She reports that she does not drink alcohol or use illicit drugs. family history is not on file. No Known Allergies      Review of Systems: Denies dysphagia to liquids. No odynophagia. Weight gain.  The remainder of the 10 point ROS is negative except as outlined in H&P   Physical Exam: General appearance  Well developed, in no distress. Overweight her voice is raspy Eyes- non icteric. HEENT nontraumatic, normocephalic. Mouth no lesions, tongue papillated, no cheilosis. Neck supple without adenopathy, thyroid not enlarged, no carotid bruits, no JVD. Lungs inspiratory and expiratory wheezes and course breath sounds. Productive cough no  shortness of breath. Cor normal S1, normal S2, regular rhythm, no murmur,  quiet precordium. Abdomen: Protuberant obese with minimal tenderness in epigastrium. Lower abdomen unremarkable. Rectal: Normal perianal area. Normal rectal sphincter tone. Painful digital exam. Small amount of brown Hemoccult negative stool. Extremities no pedal edema. Skin no lesions. Neurological alert and oriented x 3. Psychological normal mood and affect.  Assessment and Plan:  Problem #1 Solid food dysphagia consistent with benign distal esophageal stricture. She has a history of a stricture which was dilated 8 years ago. She will continue on Protonix 40 mg daily and antireflux measures. We will schedule her for an upper endoscopy and esophageal dilation after she completes her antibiotics for resolving pneumonia.   Problem #2 Rectal bleeding and rectal irritation consistent with anal fissure. She will need a colonoscopy to evaluate her rectal bleeding. This will be done following upper endoscopy if her medical condition is completely stable. For now, she will get Anusol-HC suppositories to use at bedtime.    08/04/2012 Ann [name]

## 2012-08-04 NOTE — Patient Instructions (Addendum)
You have been scheduled for an endoscopy with propofol. Please follow written instructions given to you at your visit today. If you use inhalers (even only as needed) or a CPAP machine, please bring them with you on the day of your procedure.  We have sent the following medications to your pharmacy for you to pick up at your convenience: Anusol

## 2012-08-07 ENCOUNTER — Encounter (HOSPITAL_BASED_OUTPATIENT_CLINIC_OR_DEPARTMENT_OTHER): Payer: Self-pay | Admitting: Emergency Medicine

## 2012-08-07 ENCOUNTER — Emergency Department (HOSPITAL_BASED_OUTPATIENT_CLINIC_OR_DEPARTMENT_OTHER): Payer: Medicaid Other

## 2012-08-07 ENCOUNTER — Emergency Department (HOSPITAL_BASED_OUTPATIENT_CLINIC_OR_DEPARTMENT_OTHER)
Admission: EM | Admit: 2012-08-07 | Discharge: 2012-08-07 | Disposition: A | Discharge status: Home / Self Care | Payer: Medicaid Other | Attending: Emergency Medicine | Admitting: Emergency Medicine

## 2012-08-07 DIAGNOSIS — Z79899 Other long term (current) drug therapy: Secondary | ICD-10-CM | POA: Insufficient documentation

## 2012-08-07 DIAGNOSIS — Y939 Activity, unspecified: Secondary | ICD-10-CM | POA: Insufficient documentation

## 2012-08-07 DIAGNOSIS — I10 Essential (primary) hypertension: Secondary | ICD-10-CM | POA: Insufficient documentation

## 2012-08-07 DIAGNOSIS — Z7982 Long term (current) use of aspirin: Secondary | ICD-10-CM | POA: Insufficient documentation

## 2012-08-07 DIAGNOSIS — Z8673 Personal history of transient ischemic attack (TIA), and cerebral infarction without residual deficits: Secondary | ICD-10-CM | POA: Insufficient documentation

## 2012-08-07 DIAGNOSIS — K219 Gastro-esophageal reflux disease without esophagitis: Secondary | ICD-10-CM | POA: Insufficient documentation

## 2012-08-07 DIAGNOSIS — J449 Chronic obstructive pulmonary disease, unspecified: Secondary | ICD-10-CM | POA: Insufficient documentation

## 2012-08-07 DIAGNOSIS — R42 Dizziness and giddiness: Secondary | ICD-10-CM | POA: Insufficient documentation

## 2012-08-07 DIAGNOSIS — R059 Cough, unspecified: Secondary | ICD-10-CM | POA: Insufficient documentation

## 2012-08-07 DIAGNOSIS — T50901A Poisoning by unspecified drugs, medicaments and biological substances, accidental (unintentional), initial encounter: Secondary | ICD-10-CM | POA: Insufficient documentation

## 2012-08-07 DIAGNOSIS — Z8719 Personal history of other diseases of the digestive system: Secondary | ICD-10-CM | POA: Insufficient documentation

## 2012-08-07 DIAGNOSIS — M129 Arthropathy, unspecified: Secondary | ICD-10-CM | POA: Insufficient documentation

## 2012-08-07 DIAGNOSIS — Y929 Unspecified place or not applicable: Secondary | ICD-10-CM | POA: Insufficient documentation

## 2012-08-07 DIAGNOSIS — R079 Chest pain, unspecified: Secondary | ICD-10-CM | POA: Insufficient documentation

## 2012-08-07 DIAGNOSIS — Z8701 Personal history of pneumonia (recurrent): Secondary | ICD-10-CM | POA: Insufficient documentation

## 2012-08-07 DIAGNOSIS — R05 Cough: Secondary | ICD-10-CM | POA: Insufficient documentation

## 2012-08-07 DIAGNOSIS — R0602 Shortness of breath: Secondary | ICD-10-CM | POA: Insufficient documentation

## 2012-08-07 DIAGNOSIS — R091 Pleurisy: Secondary | ICD-10-CM | POA: Insufficient documentation

## 2012-08-07 DIAGNOSIS — J9801 Acute bronchospasm: Secondary | ICD-10-CM | POA: Insufficient documentation

## 2012-08-07 DIAGNOSIS — J4489 Other specified chronic obstructive pulmonary disease: Secondary | ICD-10-CM | POA: Insufficient documentation

## 2012-08-07 HISTORY — DX: Pneumonia, unspecified organism: J18.9

## 2012-08-07 LAB — POCT I-STAT 3, ART BLOOD GAS (G3+)
Bicarbonate: 28.6 mEq/L — ABNORMAL HIGH (ref 20.0–24.0)
O2 Saturation: 97 %
TCO2: 30 mmol/L (ref 0–100)
pCO2 arterial: 40.8 mmHg (ref 35.0–45.0)
pO2, Arterial: 82 mmHg (ref 80.0–100.0)

## 2012-08-07 LAB — CBC WITH DIFFERENTIAL/PLATELET
Basophils Absolute: 0 K/uL (ref 0.0–0.1)
Basophils Relative: 0 % (ref 0–1)
Eosinophils Absolute: 0.2 K/uL (ref 0.0–0.7)
Eosinophils Relative: 2 % (ref 0–5)
HCT: 41.8 % (ref 36.0–46.0)
Hemoglobin: 13.7 g/dL (ref 12.0–15.0)
Lymphocytes Relative: 27 % (ref 12–46)
Lymphs Abs: 2.6 K/uL (ref 0.7–4.0)
MCH: 31.9 pg (ref 26.0–34.0)
MCHC: 32.8 g/dL (ref 30.0–36.0)
MCV: 97.4 fL (ref 78.0–100.0)
Monocytes Absolute: 0.7 K/uL (ref 0.1–1.0)
Monocytes Relative: 7 % (ref 3–12)
Neutro Abs: 6.1 K/uL (ref 1.7–7.7)
Neutrophils Relative %: 64 % (ref 43–77)
Platelets: 376 K/uL (ref 150–400)
RBC: 4.29 MIL/uL (ref 3.87–5.11)
RDW: 13.4 % (ref 11.5–15.5)
WBC: 9.6 K/uL (ref 4.0–10.5)

## 2012-08-07 LAB — COMPREHENSIVE METABOLIC PANEL WITH GFR
ALT: 19 U/L (ref 0–35)
AST: 14 U/L (ref 0–37)
Albumin: 3.5 g/dL (ref 3.5–5.2)
Alkaline Phosphatase: 68 U/L (ref 39–117)
BUN: 10 mg/dL (ref 6–23)
CO2: 29 meq/L (ref 19–32)
Calcium: 9 mg/dL (ref 8.4–10.5)
Chloride: 101 meq/L (ref 96–112)
Creatinine, Ser: 0.8 mg/dL (ref 0.50–1.10)
GFR calc Af Amer: >90 mL/min
GFR calc non Af Amer: 82 mL/min — ABNORMAL LOW
Glucose, Bld: 93 mg/dL (ref 70–99)
Potassium: 4 meq/L (ref 3.5–5.1)
Sodium: 140 meq/L (ref 135–145)
Total Bilirubin: 0.4 mg/dL (ref 0.3–1.2)
Total Protein: 7.1 g/dL (ref 6.0–8.3)

## 2012-08-07 MED ORDER — SODIUM CHLORIDE 0.9 % IV SOLN
Freq: Once | INTRAVENOUS | Status: AC
  Administered 2012-08-07: 15:00:00 via INTRAVENOUS

## 2012-08-07 MED ORDER — ALBUTEROL SULFATE (5 MG/ML) 0.5% IN NEBU
2.5000 mg | INHALATION_SOLUTION | RESPIRATORY_TRACT | Status: DC
  Administered 2012-08-07: 2.5 mg via RESPIRATORY_TRACT
  Filled 2012-08-07: qty 0.5

## 2012-08-07 MED ORDER — OPTICHAMBER ADVANTAGE MISC: 1.0000 | Freq: Once | Status: DC

## 2012-08-07 MED ORDER — ALBUTEROL SULFATE HFA 108 (90 BASE) MCG/ACT IN AERS: 2.0000 | INHALATION_SPRAY | RESPIRATORY_TRACT | Status: DC | PRN

## 2012-08-07 MED ORDER — HYDROCODONE-ACETAMINOPHEN 5-325 MG PO TABS: 2.0000 | ORAL_TABLET | ORAL | Status: DC | PRN

## 2012-08-07 MED ORDER — IPRATROPIUM BROMIDE 0.02 % IN SOLN
0.5000 mg | RESPIRATORY_TRACT | Status: DC
  Administered 2012-08-07: 0.5 mg via RESPIRATORY_TRACT
  Filled 2012-08-07: qty 2.5

## 2012-08-07 MED ORDER — PREDNISONE 20 MG PO TABS: ORAL_TABLET | ORAL | Status: DC

## 2012-08-07 NOTE — ED Notes (Signed)
Pt states she was discharged on Sunday from [facility] with pneumonia.  Pt has been having lightheadedness, weakness since then but today had a near syncopal episode while eating lunch around 1330.  Pt was told to be "checked" at the ER by a pharmacist due to her Combi-vent prescription was too large.  Pt having more lightheadedness with ambulation and talking.

## 2012-08-07 NOTE — ED Provider Notes (Signed)
History   This chart was scribed for [employer]. Pollina,MD scribed by Magnus Sinning. The patient was seen in room MH02/MH02 at 15:01   CSN: [id number]  Arrival date & time 08/07/12  1419   First MD Initiated Contact with Patient 08/07/12 1500      Chief Complaint  Patient presents with  . Near Syncope  . Drug Overdose    (Consider location/radiation/quality/duration/timing/severity/associated sxs/prior treatment) The history is provided by the patient and a relative. No language interpreter was used.   Ann [name] is a 57 y.o. female who presents to the Emergency Department complaining of gradually worsening severe SOB, onset 6 days with associated intermittent chest pain, unproductive cough, and dizziness. The patient states that she was diagnosed with PNA 6 days ago and she says that she was given albuterol, mucinex, Levaquin, tussin,combivent, hydrocodone,and prednisone. She says she has taken these medications with no relief and believes sxs have worsened. She reports a fever 6 days ago, but states this has since resolved. The patient says she was also diagnosed recently with COPD and the patient's relative notes that she is scheduled for throat stretching procedure in 6 days.     Past Medical History  Diagnosis Date  . Apnea   . Hypertension   . Diverticulitis   . Stroke   . Arthritis   . Hiatal hernia   . Esophageal stricture   . Pneumonia     Past Surgical History  Procedure Date  . Tubal ligation   . Knee arthroscopy     x 2    History reviewed. No pertinent family history.  History  Substance Use Topics  . Smoking status: Never Smoker   . Smokeless tobacco: Never Used  . Alcohol Use: No    Review of Systems  Constitutional: Negative for fever.  Respiratory: Positive for cough and shortness of breath.   Cardiovascular: Positive for chest pain.  Gastrointestinal: Negative for nausea, vomiting and diarrhea.  Neurological: Positive for dizziness.   All other systems reviewed and are negative.    Allergies  Review of patient's allergies indicates no known allergies.  Home Medications   Current Outpatient Rx  Name  Route  Sig  Dispense  Refill  . ALBUTEROL SULFATE HFA 108 (90 BASE) MCG/ACT IN AERS   Inhalation   Inhale 2 puffs into the lungs every 4 (four) hours as needed for wheezing.   1 Inhaler   2   . IPRATROPIUM-ALBUTEROL 18-103 MCG/ACT IN AERO   Inhalation   Inhale 2 puffs into the lungs 4 (four) times daily.   1 Inhaler   0   . ACETAMINOPHEN 325 MG PO TABS   Oral   Take 650 mg by mouth every 6 (six) hours as needed.         . ASPIRIN EC 81 MG PO TBEC   Oral   Take 81 mg by mouth every evening.         . GUAIFENESIN ER 600 MG PO TB12   Oral   Take 600 mg by mouth 2 (two) times daily.         . GUAIFENESIN 100 MG/5ML PO SOLN   Oral   Take 10 mLs (200 mg total) by mouth every 4 (four) hours as needed.         Marland Kitchen HYDROCODONE-ACETAMINOPHEN 5-325 MG PO TABS   Oral   Take 1-2 tablets by mouth every 6 (six) hours as needed.   30 tablet   0   .  HYDROCORTISONE ACETATE 25 MG RE SUPP   Rectal   Place 1 suppository (25 mg total) rectally at bedtime.   12 suppository   0   . LISINOPRIL 20 MG PO TABS   Oral   Take 20 mg by mouth 2 (two) times daily.         Marland Kitchen OMEPRAZOLE 40 MG PO CPDR   Oral   Take 1 capsule (40 mg total) by mouth daily.   30 capsule   0     BP 145/82  Pulse 79  Temp 97.9 F (36.6 C) (Oral)  Resp 22  Ht 5\' 4"  (1.626 m)  Wt 227 lb (102.967 kg)  BMI 38.96 kg/m2  SpO2 94%  Physical Exam  Nursing note and vitals reviewed. Constitutional: She is oriented to person, place, and time. She appears well-developed and well-nourished. No distress.  HENT:  Head: Normocephalic and atraumatic.  Eyes: Conjunctivae normal and EOM are normal.  Neck: Neck supple. No tracheal deviation present.  Cardiovascular: Normal rate.   Pulmonary/Chest: Effort normal. No respiratory distress.  She has wheezes.       Wheezing at mid and lower lung fields bilaterally with rhonchi  Abdominal: She exhibits no distension.  Musculoskeletal: Normal range of motion.  Neurological: She is alert and oriented to person, place, and time. No sensory deficit.  Skin: Skin is dry.  Psychiatric: She has a normal mood and affect. Her behavior is normal.    ED Course  Procedures (including critical care time) DIAGNOSTIC STUDIES: Oxygen Saturation is 94% on room air, adequate by my interpretation.    COORDINATION OF CARE: 15:02: Physical exam performed.  Labs Reviewed  COMPREHENSIVE METABOLIC PANEL - Abnormal; Notable for the following:    GFR calc non Af Amer 82 (*)     All other components within normal limits  POCT I-STAT 3, BLOOD GAS (G3+) - Abnormal; Notable for the following:    pH, Arterial 7.454 (*)     Bicarbonate 28.6 (*)     Acid-Base Excess 4.0 (*)     All other components within normal limits  CBC WITH DIFFERENTIAL  PRO B NATRIURETIC PEPTIDE  TROPONIN I  CULTURE, BLOOD (ROUTINE X 2)  CULTURE, BLOOD (ROUTINE X 2)  BLOOD GAS, ARTERIAL   Dg Chest 2 View  08/07/2012  *RADIOLOGY REPORT*  Clinical Data: Cough, shortness of breath  CHEST - 2 VIEW  Comparison: 07/27/2012  Findings: Lungs are essentially clear.  Prior nodular opacities, left upper/lower lobe predominant, have resolved.   Stable left basilar scarring/atelectasis.  No pleural effusion or pneumothorax.  The heart is normal in size.  Mild degenerative changes of the visualized thoracolumbar spine.  IMPRESSION: No evidence of acute cardiopulmonary disease.   Original Report Authenticated By: Charline Bills, M.D.      Diagnosis: Pleurisy    MDM  Patient presents with persistent difficulty breathing status post pneumonia. Patient was hospitalized for a length of time last week because of bilateral community acquired pneumonia. It was felt that she likely has some element of COPD from secondhand smoke, according to the  family. She has been on prednisone and Combivent but is still having difficulty breathing and tightness and discomfort in her chest. Cough is still quite severe as well.  Workup today, however, was quite reassuring. Patient has a normal blood gas. Chest x-ray is now clear, all signs of pneumonia have cleared. Auscultation did reveal mild wheezing bilaterally which improved with a DuoNeb. Patient was reassured. She is likely experiencing some  kidney bronchospasm and pleurisy secondary to pneumonia. Will restart prolonged prednisone taper and aggressively treat with bronchodilators. Hydrocodone for pain and cough suppression. Followup with her doctor as previously scheduled.  I personally performed the services described in this documentation, which was scribed in my presence. The recorded information has been reviewed and is accurate.        Gilda Crease, MD 08/07/12 [phone]

## 2012-08-12 ENCOUNTER — Telehealth: Payer: Self-pay | Admitting: *Deleted

## 2012-08-12 NOTE — Telephone Encounter (Signed)
Message copied by Daphine Deutscher on Thu Aug 12, 2012 10:20 AM ------      Message from: Hart Carwin      Created: Thu Aug 12, 2012  9:57 AM      Regarding: EGD       Rene Kocher, this pt is for EGD/dil tomorrow. She was in ED on 08/07/2012 with respiratory problems. Can you, please, call her to find out how she is doing and if she is planning to keep her appointment? Thanx DB

## 2012-08-12 NOTE — Telephone Encounter (Signed)
Spoke with patient and she is planning on being here for her procedure tomorrow.

## 2012-08-13 ENCOUNTER — Encounter: Payer: Self-pay | Admitting: Internal Medicine

## 2012-08-13 ENCOUNTER — Telehealth: Payer: Self-pay | Admitting: *Deleted

## 2012-08-13 ENCOUNTER — Ambulatory Visit (AMBULATORY_SURGERY_CENTER): Payer: Medicaid Other | Admitting: Internal Medicine

## 2012-08-13 VITALS — BP 124/65 | HR 63 | Temp 98.2°F | Resp 18 | Ht 64.0 in | Wt 234.0 lb

## 2012-08-13 DIAGNOSIS — K222 Esophageal obstruction: Secondary | ICD-10-CM

## 2012-08-13 DIAGNOSIS — G4733 Obstructive sleep apnea (adult) (pediatric): Secondary | ICD-10-CM | POA: Insufficient documentation

## 2012-08-13 DIAGNOSIS — R0681 Apnea, not elsewhere classified: Secondary | ICD-10-CM

## 2012-08-13 DIAGNOSIS — K219 Gastro-esophageal reflux disease without esophagitis: Secondary | ICD-10-CM

## 2012-08-13 DIAGNOSIS — R1310 Dysphagia, unspecified: Secondary | ICD-10-CM

## 2012-08-13 DIAGNOSIS — D131 Benign neoplasm of stomach: Secondary | ICD-10-CM

## 2012-08-13 MED ORDER — SODIUM CHLORIDE 0.9 % IV SOLN: 500.0000 mL | INTRAVENOUS | Status: DC

## 2012-08-13 NOTE — Op Note (Signed)
[facility] [address] [place], [address]   ENDOSCOPY PROCEDURE REPORT  PATIENT: Ann, [name].  MR#: [id number] BIRTHDATE: 1955/07/24 , 56  yrs. old GENDER: Female ENDOSCOPIST: Hart Carwin, MD REFERRED BY:  post hospitalization PROCEDURE DATE:  08/13/2012 PROCEDURE:  EGD w/ biopsy and Savary dilation of esophagus ASA CLASS:     Class III INDICATIONS:  Dysphagia.   Heartburn.   hospitalization for ? aspiration pneumonia, hx es.stricture 2006- dilated. MEDICATIONS: MAC sedation, administered by CRNA and Propofol (Diprivan) 120 mg IV TOPICAL ANESTHETIC: Cetacaine Spray  DESCRIPTION OF PROCEDURE: After the risks benefits and alternatives of the procedure were thoroughly explained, informed consent was obtained.  The [facility] [id number] endoscope was introduced through the mouth and advanced to the second portion of the duodenum. Without limitations.  The instrument was slowly withdrawn as the mucosa was fully examined.      esophagus esophageal mucosa in proximal and midesophagus was normal. irregular Z line with mild stricture which allowed the endoscope: to traverse into stomach without resistance. There was also spasm at the area of the lower esophageal sphincter. There was no hiatal hernia. Stomach:  gastric mucosa was normal in the body of the stomach, there were linear erosions in the gastric antrum which were biopsied to rule out H. pyloric gastritis. Pyloric outlet was normal retroflexion of the endoscope revealed normal fundus and cardia  Duodenum : the bjulb and descending duodenum was normal. Guidewire was placed through the of the esophagus into the stomach without fluoroscopic guidance and Savary dilator past without difficulty using 14, 15, 16 and 17 mm dilators. There was small amount of blood on the last dilator, patient tolerated procedure well[ The scope was then withdrawn from the patient and the  procedure completed.  COMPLICATIONS: There were no complications. ENDOSCOPIC IMPRESSION:   non-obstructing esophageal strictures. Esophageal dilatation to 17 mm Esophageal spasm Mild antral gastritis. Status post biopsies  RECOMMENDATIONS: Continue PPIs and antireflux measures We will arrange for patient to see a pulmonologist to followup on her asthmatic bronchitis, her oxygen saturation on room air today were 92% When her respiratory condition stabilizes she will need a screening colonoscopy REPEAT EXAM:  no  eSigned:  Hart Carwin, MD 08/13/2012 9:08 AM   CC:  PATIENT NAME:  Ann [name]. MR#: [id number]

## 2012-08-13 NOTE — Progress Notes (Addendum)
Pt was recently hospitalized for pneumonia, sats 92% on admission.  She does has her inhaler with her. 2 puffs taken at 830 per L [place] CRNA's order.  CRNA made aware of pt's hx

## 2012-08-13 NOTE — Progress Notes (Signed)
Pre procedure at 8:30 took 2 puffs inhaler.   To RR alert awake BBS course cough slightly Sa02 99%

## 2012-08-13 NOTE — Progress Notes (Signed)
Patient did not experience any of the following events: a burn prior to discharge; a fall within the facility; wrong site/side/patient/procedure/implant event; or a hospital transfer or hospital admission upon discharge from the facility. (G8907) Patient did not have preoperative order for IV antibiotic SSI prophylaxis. (G8918)  

## 2012-08-13 NOTE — Patient Instructions (Addendum)
YOU HAD AN ENDOSCOPIC PROCEDURE TODAY AT THE [facility]: Refer to the procedure report that was given to you for any specific questions about what was found during the examination.  If the procedure report does not answer your questions, please call your gastroenterologist to clarify.  If you requested that your care partner not be given the details of your procedure findings, then the procedure report has been included in a sealed envelope for you to review at your convenience later.  YOU SHOULD EXPECT: Some feelings of bloating in the abdomen. Passage of more gas than usual.  Walking can help get rid of the air that was put into your GI tract during the procedure and reduce the bloating. If you had a lower endoscopy (such as a colonoscopy or flexible sigmoidoscopy) you may notice spotting of blood in your stool or on the toilet paper. If you underwent a bowel prep for your procedure, then you may not have a normal bowel movement for a few days.  DIET: see below  ACTIVITY: Your care partner should take you home directly after the procedure.  You should plan to take it easy, moving slowly for the rest of the day.  You can resume normal activity the day after the procedure however you should NOT DRIVE or use heavy machinery for 24 hours (because of the sedation medicines used during the test).    SYMPTOMS TO REPORT IMMEDIATELY: A gastroenterologist can be reached at any hour.  During normal business hours, 8:30 AM to 5:00 PM Monday through Friday, call [phone].  After hours and on weekends, please call the GI answering service at [phone] who will take a message and have the physician on call contact you.   Following upper endoscopy (EGD)  Vomiting of blood or coffee ground material  New chest pain or pain under the shoulder blades  Painful or persistently difficult swallowing  New shortness of breath  Fever of 100F or higher  Black, tarry-looking stools  FOLLOW UP: If  any biopsies were taken you will be contacted by phone or by letter within the next 1-3 weeks.  Call your gastroenterologist if you have not heard about the biopsies in 3 weeks.  Our staff will call the home number listed on your records the next business day following your procedure to check on you and address any questions or concerns that you may have at that time regarding the information given to you following your procedure. This is a courtesy call and so if there is no answer at the home number and we have not heard from you through the emergency physician on call, we will assume that you have returned to your regular daily activities without incident.  SIGNATURES/CONFIDENTIALITY: You and/or your care partner have signed paperwork which will be entered into your electronic medical record.  These signatures attest to the fact that that the information above on your After Visit Summary has been reviewed and is understood.  Full responsibility of the confidentiality of this discharge information lies with you and/or your care-partner.   Stricture and dilation-handout given  Dilation diet-handout given  Gastritis-handout given  Colonoscopy screening needed  Pulmonologist- Dr. Craige Cotta  Feb. 11, 2014 at 345 pm

## 2012-08-13 NOTE — Telephone Encounter (Signed)
Per Dr. Juanda Chance, patient needs pulmonary OV at [facility] ASAP for wheezing, asthma. O2 on room air 92% today. Spoke with pulmonary scheduling and scheduled with Dr. Craige Cotta n 08/17/12 at 3:45 PM. Spoke with April in [facility] recovery and gave her appointment date/time to give to patient.

## 2012-08-14 LAB — CULTURE, BLOOD (ROUTINE X 2): Culture: NO GROWTH

## 2012-08-16 ENCOUNTER — Telehealth: Payer: Self-pay | Admitting: *Deleted

## 2012-08-16 NOTE — Telephone Encounter (Signed)
  Follow up Call-  Call back number 08/13/2012  Post procedure Call Back phone  # [phone]  Permission to leave phone message Yes     Patient questions:  Line kept ringing, and no one ever answered.  No voice mail.

## 2012-08-17 ENCOUNTER — Ambulatory Visit (INDEPENDENT_AMBULATORY_CARE_PROVIDER_SITE_OTHER): Payer: Medicaid Other | Admitting: Pulmonary Disease

## 2012-08-17 ENCOUNTER — Encounter: Payer: Self-pay | Admitting: Internal Medicine

## 2012-08-17 ENCOUNTER — Encounter: Payer: Self-pay | Admitting: Pulmonary Disease

## 2012-08-17 VITALS — BP 144/90 | HR 117 | Temp 97.9°F | Ht 64.0 in | Wt 227.0 lb

## 2012-08-17 DIAGNOSIS — I10 Essential (primary) hypertension: Secondary | ICD-10-CM

## 2012-08-17 DIAGNOSIS — R05 Cough: Secondary | ICD-10-CM

## 2012-08-17 DIAGNOSIS — R0681 Apnea, not elsewhere classified: Secondary | ICD-10-CM

## 2012-08-17 DIAGNOSIS — Z0000 Encounter for general adult medical examination without abnormal findings: Secondary | ICD-10-CM

## 2012-08-17 DIAGNOSIS — G4733 Obstructive sleep apnea (adult) (pediatric): Secondary | ICD-10-CM

## 2012-08-17 MED ORDER — AMLODIPINE BESYLATE 5 MG PO TABS: 10.0000 mg | ORAL_TABLET | Freq: Every day | ORAL | Status: DC

## 2012-08-17 NOTE — Progress Notes (Signed)
Chief Complaint  Patient presents with  . Advice Only    Pt was in the hospital 1/21-1/26 for PNA. C/O SOB w/ activity, dry cough, wheezing, chest tx, CP under right breast into right shoulder and jaw    History of Present Illness: Ann [name] is a 57 y.o. female with 2nd hand tobacco exposure for evaluation of cough.  She would also like evaluation for sleep apnea.  She was previously followed by Dr. Maple Hudson for OSA.  She was diagnosed with OSA years ago, and had been on CPAP.  She has not been on CPAP for several years due to financial restrictions.  She feels sleepy throughout the day, and gets frequent migraines.  She does snore, and has been told she stops breathing while asleep.  She was recently seen by Dr. Lina Sar for esophageal stricture.  She had dilation done.  This was repeated last week.  She was in the hospital in January for pneumonia.  Since then she has noticed trouble with cough and wheezing.  She was told she might have COPD, but this was never confirmed.  She gets occasional hoarseness.  This was worse when she was using combivent.  She did not feel like combivent helped her cough or wheezing.  She had also been tried on prednisone to determine if this would help her cough and wheezing, but this therapy did not make any difference.  She has noticed a cough for years.  She clears her throat a lot, and gets a globus sensation.  She does not feel like she has trouble with her sinuses.   She never smoked, but her husband smoked a lot.  She denies any occupational or animal exposures.  She worked in [employer].  There is no history of exposure to tuberculosis.   Tests:  Past Medical History  Diagnosis Date  . Apnea     wear CPAP  . Hypertension   . Diverticulitis   . Arthritis   . Hiatal hernia   . Esophageal stricture   . Pneumonia   . Allergy   . COPD (chronic obstructive pulmonary disease)   . GERD (gastroesophageal reflux disease)   . Hyperlipidemia   . Chronic  kidney disease     frequent UTIs  . Stroke     3 TIAs- 1980, 2005, 2008    Past Surgical History  Procedure Laterality Date  . Tubal ligation    . Knee arthroscopy      x 2  . Upper gastrointestinal endoscopy    . Throat stretch  06,2014    x 2    Current Outpatient Prescriptions on File Prior to Visit  Medication Sig Dispense Refill  . acetaminophen (TYLENOL) 325 MG tablet Take 650 mg by mouth every 6 (six) hours as needed.      Marland Kitchen albuterol (PROVENTIL HFA;VENTOLIN HFA) 108 (90 BASE) MCG/ACT inhaler Inhale 2 puffs into the lungs every 4 (four) hours as needed for wheezing.  1 Inhaler  2  . aspirin EC 81 MG tablet Take 81 mg by mouth every evening.      Marland Kitchen guaiFENesin (MUCINEX) 600 MG 12 hr tablet Take 600 mg by mouth 2 (two) times daily.      Marland Kitchen HYDROcodone-acetaminophen (NORCO/VICODIN) 5-325 MG per tablet Take 1-2 tablets by mouth every 6 (six) hours as needed.  30 tablet  0  . HYDROcodone-acetaminophen (NORCO/VICODIN) 5-325 MG per tablet Take 2 tablets by mouth every 4 (four) hours as needed for pain.  10 tablet  0  . hydrocortisone (ANUSOL-HC) 25 MG suppository Place 1 suppository (25 mg total) rectally at bedtime.  12 suppository  0  . lisinopril (PRINIVIL,ZESTRIL) 20 MG tablet Take 20 mg by mouth 2 (two) times daily.      Marland Kitchen omeprazole (PRILOSEC) 40 MG capsule Take 1 capsule (40 mg total) by mouth daily.  30 capsule  0  . predniSONE (DELTASONE) 20 MG tablet 3 tabs po daily x 3 days, then 2 tabs x 3 days, then 1.5 tabs x 3 days, then 1 tab x 3 days, then 0.5 tabs x 3 days  27 tablet  0  . Spacer/Aero-Holding Chambers (OPTICHAMBER ADVANTAGE) MISC 1 each by Other route once.  1 each  0   No current facility-administered medications on file prior to visit.    No Known Allergies  Family History  Problem Relation Age of Onset  . Colon cancer Father   . Esophageal cancer Neg Hx   . Rectal cancer Neg Hx   . Stomach cancer Neg Hx   . Heart disease Mother     History  Substance  Use Topics  . Smoking status: Never Smoker   . Smokeless tobacco: Never Used  . Alcohol Use: No    Review of Systems  Constitutional: Positive for appetite change. Negative for unexpected weight change.  HENT: Positive for trouble swallowing. Negative for ear pain, congestion, sore throat, sneezing and dental problem.   Respiratory: Positive for cough and shortness of breath.   Cardiovascular: Negative for chest pain, palpitations and leg swelling.  Gastrointestinal: Negative for abdominal pain.  Neurological: Positive for headaches.  Psychiatric/Behavioral: The patient is nervous/anxious.    Physical Exam: Filed Vitals:   08/17/12 1551  BP: 144/90  Pulse: 117  Temp: 97.9 F (36.6 C)  TempSrc: Oral  Height: 5\' 4"  (1.626 m)  Weight: 227 lb (102.967 kg)  SpO2: 94%    Current Encounter SPO2  08/17/12 1551 94%  08/13/12 0935 96%  08/13/12 0925 96%  08/13/12 0915 95%  08/13/12 0905 95%  08/13/12 0844 95%  08/13/12 0837 92%  08/07/12 1746 96%  08/07/12 1559 96%  08/07/12 1451 94%    Wt Readings from Last 3 Encounters:  08/17/12 227 lb (102.967 kg)  08/13/12 234 lb (106.142 kg)  08/07/12 227 lb (102.967 kg)    Body mass index is 38.95 kg/(m^2).   General - No distress ENT - No sinus tenderness, no oral exudate, no LAN, no thyromegaly, TM clear, pupils equal/reactive Cardiac - s1s2 regular, no murmur, pulses symmetric Chest - No wheeze/rales/dullness, good air entry, normal respiratory excursion Back - No focal tenderness Abd - Soft, non-tender, no organomegaly, + bowel sounds Ext - No edema Neuro - Normal strength, cranial nerves intact Skin - No rashes Psych - Normal mood, and behavior.   Dg Chest 2 View  08/07/2012  *RADIOLOGY REPORT*  Clinical Data: Cough, shortness of breath  CHEST - 2 VIEW  Comparison: 07/27/2012  Findings: Lungs are essentially clear.  Prior nodular opacities, left upper/lower lobe predominant, have resolved.   Stable left basilar  scarring/atelectasis.  No pleural effusion or pneumothorax.  The heart is normal in size.  Mild degenerative changes of the visualized thoracolumbar spine.  IMPRESSION: No evidence of acute cardiopulmonary disease.   Original Report Authenticated By: Charline Bills, M.D.     Lab Results  Component Value Date   WBC 9.6 08/07/2012   HGB 13.7 08/07/2012   HCT 41.8 08/07/2012   MCV 97.4 08/07/2012  PLT 376 08/07/2012    Lab Results  Component Value Date   CREATININE 0.80 08/07/2012   BUN 10 08/07/2012   NA 140 08/07/2012   K 4.0 08/07/2012   CL 101 08/07/2012   CO2 29 08/07/2012    Lab Results  Component Value Date   ALT 19 08/07/2012   AST 14 08/07/2012   ALKPHOS 68 08/07/2012   BILITOT 0.4 08/07/2012      BNP    Component Value Date/Time   PROBNP 19.6 08/07/2012 1515      Assessment/Plan:  Coralyn Helling, MD [facility]Sleep Pager:  [phone] 08/17/2012, 4:06 PM

## 2012-08-17 NOTE — Patient Instructions (Signed)
Finish course of prednisone Stop lisinopril (zestril) Amlodipine (Norvasc) 10 mg daily Stop combivent Ventolin two puffs as needed for cough, wheezing, or chest congestion Will schedule breathing test (PFT) Will schedule sleep study Will try to arrange for primary care referral Follow up in 3 weeks

## 2012-08-17 NOTE — Progress Notes (Deleted)
  Subjective:    Patient ID: Ann [name], female    DOB: 1955/09/07, 57 y.o.   MRN: [id number]  HPI    Review of Systems  Constitutional: Positive for appetite change. Negative for unexpected weight change.  HENT: Positive for trouble swallowing. Negative for ear pain, congestion, sore throat, sneezing and dental problem.   Respiratory: Positive for cough and shortness of breath.   Cardiovascular: Negative for chest pain, palpitations and leg swelling.  Gastrointestinal: Negative for abdominal pain.  Neurological: Positive for headaches.  Psychiatric/Behavioral: The patient is nervous/anxious.        Objective:   Physical Exam        Assessment & Plan:

## 2012-08-24 ENCOUNTER — Encounter: Payer: Self-pay | Admitting: Pulmonary Disease

## 2012-08-24 NOTE — Assessment & Plan Note (Signed)
She has prior history of sleep apnea.  She has not been able to afford therapy for several years.  She continues to have snoring, sleep disruption, and daytime sleepiness.  She also has a history of hypertension.  To further assess will arrange for in lab sleep study.

## 2012-08-24 NOTE — Assessment & Plan Note (Signed)
She was treated for pneumonia in January 2014.  Her Chest xray from this month shows resolution of infiltrates.  If her cough persists off of prednisone, then she may need repeat chest imaging studies.

## 2012-08-24 NOTE — Assessment & Plan Note (Signed)
She has chronic cyclic cough.  This appears to be made worse by use of combivent.  Her symptoms are not typical for obstructive lung disease, and rather suggest an upper airway problem.  She has not noticed improvement with prednisone therapy either.  She can finish her course of prednisone as prescribed.  She can use ventolin as needed.  Will arrange for PFT's to further assess.  She is on lisinopril, and this could be contributing to her cough.  Will stop her lisinopril, and switch to amlodipine.  Will assess her blood pressure control at next visit.  Will arrange for primary care referral to further manage her blood pressure.  Some of her cough may be related to reflux.  She is followed by GI for this.

## 2012-09-08 ENCOUNTER — Ambulatory Visit (HOSPITAL_BASED_OUTPATIENT_CLINIC_OR_DEPARTMENT_OTHER): Payer: Medicaid Other | Attending: Pulmonary Disease | Admitting: Radiology

## 2012-09-08 VITALS — Ht 64.0 in | Wt 227.0 lb

## 2012-09-08 DIAGNOSIS — G4733 Obstructive sleep apnea (adult) (pediatric): Secondary | ICD-10-CM | POA: Insufficient documentation

## 2012-09-08 DIAGNOSIS — Z79899 Other long term (current) drug therapy: Secondary | ICD-10-CM | POA: Insufficient documentation

## 2012-09-08 DIAGNOSIS — Z7982 Long term (current) use of aspirin: Secondary | ICD-10-CM | POA: Insufficient documentation

## 2012-09-08 DIAGNOSIS — I10 Essential (primary) hypertension: Secondary | ICD-10-CM | POA: Insufficient documentation

## 2012-09-14 ENCOUNTER — Encounter: Payer: Self-pay | Admitting: Pulmonary Disease

## 2012-09-14 ENCOUNTER — Ambulatory Visit (INDEPENDENT_AMBULATORY_CARE_PROVIDER_SITE_OTHER): Payer: Medicaid Other | Admitting: Pulmonary Disease

## 2012-09-14 VITALS — BP 124/80 | HR 93 | Temp 98.0°F | Ht 64.0 in | Wt 230.0 lb

## 2012-09-14 DIAGNOSIS — R05 Cough: Secondary | ICD-10-CM

## 2012-09-14 DIAGNOSIS — R059 Cough, unspecified: Secondary | ICD-10-CM

## 2012-09-14 DIAGNOSIS — I10 Essential (primary) hypertension: Secondary | ICD-10-CM

## 2012-09-14 DIAGNOSIS — G4733 Obstructive sleep apnea (adult) (pediatric): Secondary | ICD-10-CM

## 2012-09-14 LAB — PULMONARY FUNCTION TEST

## 2012-09-14 MED ORDER — MOMETASONE FUROATE 50 MCG/ACT NA SUSP: 2.0000 | Freq: Every day | NASAL | Status: DC

## 2012-09-14 NOTE — Assessment & Plan Note (Signed)
Will call her once sleep study results reviewed.

## 2012-09-14 NOTE — Progress Notes (Signed)
Chief Complaint  Patient presents with  . Follow-up    w/ PFT. per pt her cough is not any better, SOB is not better, wheezing and chest tx.     History of Present Illness: Ann [name] is a 57 y.o. female with OSA and cough.  She is here to review her PFT.  This was normal.  She had sleep study last week, but this has not been reviewed yet.  She had esophageal dilation with Dr. Juanda Chance February 7.  She has noticed more trouble swallowing since.  She does not think she has sinus congestion.  She denies bitter taste in mouth or chest discomfort.    She has not felt that inhalers have helped her cough.  She gets winded very quickly with activity, but recovers after a few minutes of rest.  Using her inhalers don't really help with this.  She denies leg swelling.  She feels tired all the time.   TESTS: CXR 08/07/12 >> no acute abnormalities PFT 09/14/12 >> FEV1 1.88 (78%), FEV1% 78, TLC 3.95 (80%), DLCO 92%, no BD  Ann [name]  has a past medical history of OSA (obstructive sleep apnea); Hypertension; Diverticulitis; Arthritis; Hiatal hernia; Esophageal stricture; Pneumonia (January 2014); Allergy; GERD (gastroesophageal reflux disease); Hyperlipidemia; Chronic kidney disease; and TIA (transient ischemic attack).  Ann [name]  has past surgical history that includes Tubal ligation; Knee arthroscopy; Upper gastrointestinal endoscopy; and Esophageal dilation (45,4098).  Prior to Admission medications   Medication Sig Start Date End Date Taking? Authorizing Provider  acetaminophen (TYLENOL) 325 MG tablet Take 650 mg by mouth every 6 (six) hours as needed.   Yes Historical Provider, MD  albuterol (PROVENTIL HFA;VENTOLIN HFA) 108 (90 BASE) MCG/ACT inhaler Inhale 2 puffs into the lungs every 4 (four) hours as needed for wheezing. 08/01/12  Yes Adeline Joselyn Glassman, MD  amLODipine (NORVASC) 5 MG tablet Take 2 tablets (10 mg total) by mouth daily. 08/17/12  Yes Coralyn Helling, MD  aspirin EC 81 MG tablet  Take 81 mg by mouth every evening.   Yes Historical Provider, MD    No Known Allergies   Physical Exam:  General - No distress ENT - No sinus tenderness, no oral exudate, no LAN Cardiac - s1s2 regular, no murmur Chest - No wheeze/rales/dullness Back - No focal tenderness Abd - Soft, non-tender Ext - No edema Neuro - Normal strength Skin - No rashes Psych - normal mood, and behavior   Assessment/Plan:  Coralyn Helling, MD [place] [facility]/Sleep Pager:  [phone] 09/14/2012, 2:30 PM

## 2012-09-14 NOTE — Assessment & Plan Note (Addendum)
She is to continue amlodpine although it does not appear that change from ACE inhibitor made any difference with her cough.  She will call to reschedule her PCP referral appointment.

## 2012-09-14 NOTE — Progress Notes (Signed)
PFT done today. 

## 2012-09-14 NOTE — Patient Instructions (Signed)
Nasal irrigation (saline nasal spray) daily Nasonex two sprays in each nostril until sample is finished >> call if you feel this is helping cough and will send refill Will call with results of sleep study Follow up in 8 weeks

## 2012-09-14 NOTE — Assessment & Plan Note (Signed)
She has chronic cyclic cough.   Her symptoms are not suggestive of obstructive lung disease.  Her lack of response to inhalers/prednisone, and normal PFT's also speak against obstructive lung disease.  Will give her trial of nasal irrigation and nasonex.  I have given sample of nasonex.  She is to call for refill if this is beneficial.  Some of her cough may be related to reflux.  She is followed by GI for this.  Advised her to d/w GI about her feel that food gets stuck in her esophagus.

## 2012-09-17 ENCOUNTER — Telehealth: Payer: Self-pay | Admitting: Internal Medicine

## 2012-09-17 ENCOUNTER — Telehealth: Payer: Self-pay | Admitting: Pulmonary Disease

## 2012-09-17 DIAGNOSIS — IMO0002 Reserved for concepts with insufficient information to code with codable children: Secondary | ICD-10-CM

## 2012-09-17 DIAGNOSIS — G4736 Sleep related hypoventilation in conditions classified elsewhere: Secondary | ICD-10-CM

## 2012-09-17 DIAGNOSIS — R1319 Other dysphagia: Secondary | ICD-10-CM

## 2012-09-17 DIAGNOSIS — G4733 Obstructive sleep apnea (adult) (pediatric): Secondary | ICD-10-CM

## 2012-09-17 MED ORDER — OMEPRAZOLE 40 MG PO CPDR: DELAYED_RELEASE_CAPSULE | ORAL | Status: DC

## 2012-09-17 NOTE — Telephone Encounter (Signed)
Scheduled barium esophagram at [facility] radiology([facility]) on 09/20/12 11:00 AM. No prep. Patient notified of Dr. Regino Schultze recommendations and appointment date/time/instructions. Rx sent for Omeprazole.

## 2012-09-17 NOTE — Telephone Encounter (Signed)
Please schedule her for barium esophagram to assess the motility of the esophagus, I suspect she has a problem there. Make sure she takes her PPI's twice a day and chews well. We will call her with results.

## 2012-09-17 NOTE — Telephone Encounter (Signed)
Patient states she is worse now than before the EGD. States she eats small bites and chews food a lot. When she swallows, the food gets stuck. She burps and feels like she is having a heart attack then vomits. She is afraid she will aspirate. Please, advise.

## 2012-09-17 NOTE — Telephone Encounter (Signed)
PSG 09/08/12 >> AHI 12.2, SpO2 low 79%, Spent 46 min with SpO2 < 88%, PLMI 0.  Left message detailing results.  She has mild OSA, and possibly sleep related hypoventilation.  Will need to have in lab CPAP titration study.  I have ordered this.  Advised pt to call back with questions.  Otherwise will schedule ROV after titration study reviewed.  Will have my nurse follow up with pt to ensure receipt of message and agreement with scheduling titration study.

## 2012-09-18 NOTE — Procedures (Signed)
[name], [name] NO.:  [id number]  MEDICAL RECORD NO.:  [id number]          PATIENT TYPE:  OUT  LOCATION:  SLEEP CENTER                 FACILITY:  [facility]  PHYSICIAN:  Coralyn Helling, MD        DATE OF BIRTH:  Nov 15, 1955  DATE OF STUDY:  09/08/2012                           NOCTURNAL POLYSOMNOGRAM  REFERRING PHYSICIAN:  Coralyn Helling, MD  FACILITY:  [facility].  INDICATION FOR STUDY:  [name] is a 57 year old female who has a history of obstructive sleep apnea.  However, she was not able to afford therapy before.  She also has a history of hypertension.  She continues to have snoring, sleep disruption, and daytime sleepiness.  She was referred to Sleep Lab for evaluation of hypersomnia with obstructive sleep apnea.  Height is 5 feet and 4 inches, weight is 127 pounds, BMI is 39, neck size is 14.5 inches.  EPWORTH SLEEPINESS SCORE:  15.  MEDICATIONS:  Tylenol, albuterol, Norvasc, aspirin, Mucinex, hydrocodone, omeprazole, and prednisone.  SLEEP ARCHITECTURE:  Total recording time was 384 minutes.  Total sleep time was 348 minutes.  Sleep efficiency was 91%.  Sleep latency was 10.5 minutes.  REM latency was 62.5 minutes.  The patient was observed in all stages of sleep and she slept exclusively in the nonsupine position.  RESPIRATORY DATA:  The average respiratory rate was 18.  Moderate-to- loud snoring was noted by the technician.  The overall apnea-hypopnea index was 12.2.  The events were exclusively obstructive in nature.  OXYGEN DATA:  The baseline oxygenation was 93%.  The oxygen saturation nadir was 79%.  The study was conducted without the use of supplemental oxygen.  CARDIAC DATA:  The average heart rate was 72 and the rhythm strip showed sinus rhythm with frequent PACs and sinus arrhythmia.  MOVEMENT-PARASOMNIA:  The periodic limb movement index was 0 and the patient had 1 restroom trip.  IMPRESSIONS-RECOMMENDATIONS:  This study  shows evidence for moderate obstructive sleep apnea with an apnea-hypopnea index of 12.2 and oxygen saturation nadir of 79%.  Of note is that she had oxygen desaturations in the absence of obstructive apneic event.  She spent a total of 46 minutes with an oxygen saturation below 88%.  This is suggestive of sleep- related hypoventilation.  In addition to diet, exercise, and weight reduction, I would recommend that the patient return to the Sleep Lab for a CPAP titration study.  At that time, then it could to be determined if she would need to use supplemental oxygen in addition to CPAP therapy, or alternatively if she would need to use BiPAP therapy.     Coralyn Helling, MD Diplomat, American Board of Sleep Medicine    VS/MEDQ  D:  09/17/2012 18:23:02  T:  09/18/2012 02:56:47  Job:  [id number]

## 2012-09-20 ENCOUNTER — Other Ambulatory Visit: Payer: Self-pay | Admitting: *Deleted

## 2012-09-20 ENCOUNTER — Ambulatory Visit (HOSPITAL_COMMUNITY)
Admission: RE | Admit: 2012-09-20 | Discharge: 2012-09-20 | Disposition: A | Discharge status: Home / Self Care | Payer: Medicaid Other | Source: Ambulatory Visit | Attending: Internal Medicine | Admitting: Internal Medicine

## 2012-09-20 DIAGNOSIS — R112 Nausea with vomiting, unspecified: Secondary | ICD-10-CM

## 2012-09-20 DIAGNOSIS — K449 Diaphragmatic hernia without obstruction or gangrene: Secondary | ICD-10-CM | POA: Insufficient documentation

## 2012-09-20 DIAGNOSIS — R1310 Dysphagia, unspecified: Secondary | ICD-10-CM | POA: Insufficient documentation

## 2012-09-20 DIAGNOSIS — R1319 Other dysphagia: Secondary | ICD-10-CM

## 2012-09-20 DIAGNOSIS — K2289 Other specified disease of esophagus: Secondary | ICD-10-CM | POA: Insufficient documentation

## 2012-09-20 DIAGNOSIS — K228 Other specified diseases of esophagus: Secondary | ICD-10-CM | POA: Insufficient documentation

## 2012-09-20 DIAGNOSIS — K219 Gastro-esophageal reflux disease without esophagitis: Secondary | ICD-10-CM | POA: Insufficient documentation

## 2012-09-22 ENCOUNTER — Encounter (HOSPITAL_COMMUNITY)
Admission: RE | Admit: 2012-09-22 | Discharge: 2012-09-22 | Disposition: A | Discharge status: Home / Self Care | Payer: Medicaid Other | Source: Ambulatory Visit | Attending: Internal Medicine | Admitting: Internal Medicine

## 2012-09-22 ENCOUNTER — Encounter (HOSPITAL_COMMUNITY): Payer: Self-pay

## 2012-09-22 DIAGNOSIS — R141 Gas pain: Secondary | ICD-10-CM | POA: Insufficient documentation

## 2012-09-22 DIAGNOSIS — R142 Eructation: Secondary | ICD-10-CM | POA: Insufficient documentation

## 2012-09-22 DIAGNOSIS — K219 Gastro-esophageal reflux disease without esophagitis: Secondary | ICD-10-CM | POA: Insufficient documentation

## 2012-09-22 DIAGNOSIS — E119 Type 2 diabetes mellitus without complications: Secondary | ICD-10-CM | POA: Insufficient documentation

## 2012-09-22 DIAGNOSIS — K449 Diaphragmatic hernia without obstruction or gangrene: Secondary | ICD-10-CM | POA: Insufficient documentation

## 2012-09-22 DIAGNOSIS — R112 Nausea with vomiting, unspecified: Secondary | ICD-10-CM | POA: Insufficient documentation

## 2012-09-22 MED ORDER — TECHNETIUM TC 99M SULFUR COLLOID
1.9000 | Freq: Once | INTRAVENOUS | Status: AC | PRN
  Administered 2012-09-22: 1.9 via ORAL

## 2012-09-23 NOTE — Telephone Encounter (Signed)
Pt did receive message. She needed nothing further

## 2012-10-11 ENCOUNTER — Ambulatory Visit (HOSPITAL_BASED_OUTPATIENT_CLINIC_OR_DEPARTMENT_OTHER): Payer: Medicaid Other | Attending: Pulmonary Disease | Admitting: Radiology

## 2012-10-11 VITALS — Ht 64.0 in | Wt 234.0 lb

## 2012-10-11 DIAGNOSIS — G4736 Sleep related hypoventilation in conditions classified elsewhere: Secondary | ICD-10-CM

## 2012-10-11 DIAGNOSIS — G4733 Obstructive sleep apnea (adult) (pediatric): Secondary | ICD-10-CM | POA: Insufficient documentation

## 2012-10-12 ENCOUNTER — Telehealth: Payer: Self-pay | Admitting: Pulmonary Disease

## 2012-10-12 DIAGNOSIS — G4733 Obstructive sleep apnea (adult) (pediatric): Secondary | ICD-10-CM

## 2012-10-12 NOTE — Telephone Encounter (Signed)
CPAP titration 10/11/12 >> CPAP 14 cm H2O >> AHI 2.9, +R, +S.  Did not need supplemental oxygen.  Results d/w pt.  She is agreeable to start CPAP.  Will arrange for CPAP set up.  Will have my nurse schedule ROV 2 months after CPAP set up.

## 2012-10-13 NOTE — Procedures (Signed)
[name], [name] NO.:  [id number]  MEDICAL RECORD NO.:  [id number]          PATIENT TYPE:  OUT  LOCATION:  SLEEP CENTER                 FACILITY:  [facility]  PHYSICIAN:  Coralyn Helling, MD        DATE OF BIRTH:  15-Nov-1955  DATE OF STUDY:  10/11/2012                           NOCTURNAL POLYSOMNOGRAM  REFERRING PHYSICIAN:  Coralyn Helling, MD  FACILITY:  [facility].  INDICATION:  Ms. Ann [name] is a 57 year old female, who has a history of hypertension.  She was also found to have obstructive sleep apnea after having a sleep study on September 08, 2012.  She was found to have an apnea/hypopnea index of 12.2, and oxygen saturation nadir of 79%.  She was also noted to have episodes of oxygen desaturation without the presence of apneic events.  She was referred to the sleep lab for a CPAP titration study.  Height is 5 feet 4 inches, weight is 234 pounds.  BMI is 40.  Neck size is 14.5 inches.  MEDICATIONS:  Tylenol, albuterol, Norvasc, aspirin, Nasonex, and Prilosec.  EPWORTH SLEEPINESS SCORE:  8.  SLEEP ARCHITECTURE:  Total recording time was 364 minutes, total sleep time was 313 minutes.  Sleep efficiency was 86%.  Sleep latency was 8 minutes.  REM latency was 104 minutes.  The patient was observed in all stages of sleep and she slept in both the supine and nonsupine positions.  RESPIRATORY DATA:  The average respiratory rate was 18.  The patient was started on CPAP at 5 and increased to 15 cm of water.  With CPAP set at 14 cm water the apnea/hypopnea index was reduced to 2.9.  At this pressure setting, she was observed in REM sleep and supine sleep.  OXYGEN DATA:  The baseline oxygenation was 95%.  The oxygen saturation nadir was 86%.  The patient had good control of her oxygenation with CPAP at 14 cm water.  She did not require the use of supplemental oxygen during this study.  CARDIAC DATA:  The average heart rate was 63 and the rhythm strip showed sinus  rhythm with occasional PVCs and PACs.  MOVEMENT PARASOMNIA:  The patient had 1 restroom trip and the periodic limb movement index was 0.  IMPRESSION:  This was a successful CPAP titration study.  She did well with CPAP at 14 cm water.  She did not need to use supplemental oxygen during the study.  In addition to diet, exercise, and weight reduction, I would recommend the patient be started on CPAP at 14 cm water and monitored for her clinical response.     Coralyn Helling, MD Diplomat, American Board of Sleep Medicine    VS/MEDQ  D:  10/12/2012 15:59:30  T:  10/13/2012 02:32:00  Job:  [id number]

## 2012-10-18 NOTE — Telephone Encounter (Signed)
I spoke with pt. Will call he ronce VS June schedule is out. Nothing further was needed

## 2012-11-08 ENCOUNTER — Ambulatory Visit: Payer: Medicaid Other | Admitting: Neurology

## 2012-12-08 ENCOUNTER — Ambulatory Visit: Payer: Medicaid Other | Admitting: Pulmonary Disease

## 2013-01-25 ENCOUNTER — Ambulatory Visit (INDEPENDENT_AMBULATORY_CARE_PROVIDER_SITE_OTHER): Payer: Medicaid Other | Admitting: Neurology

## 2013-01-25 ENCOUNTER — Encounter: Payer: Self-pay | Admitting: Neurology

## 2013-01-25 VITALS — BP 158/89 | HR 72 | Wt 233.0 lb

## 2013-01-25 DIAGNOSIS — R439 Unspecified disturbances of smell and taste: Secondary | ICD-10-CM

## 2013-01-25 DIAGNOSIS — R430 Anosmia: Secondary | ICD-10-CM

## 2013-01-25 DIAGNOSIS — R059 Cough, unspecified: Secondary | ICD-10-CM

## 2013-01-25 DIAGNOSIS — E639 Nutritional deficiency, unspecified: Secondary | ICD-10-CM

## 2013-01-25 DIAGNOSIS — G63 Polyneuropathy in diseases classified elsewhere: Secondary | ICD-10-CM

## 2013-01-25 DIAGNOSIS — J189 Pneumonia, unspecified organism: Secondary | ICD-10-CM

## 2013-01-25 DIAGNOSIS — I10 Essential (primary) hypertension: Secondary | ICD-10-CM

## 2013-01-25 DIAGNOSIS — G4733 Obstructive sleep apnea (adult) (pediatric): Secondary | ICD-10-CM

## 2013-01-25 DIAGNOSIS — R05 Cough: Secondary | ICD-10-CM

## 2013-01-25 NOTE — Progress Notes (Signed)
[facility]  PATIENT: Ann [name] DOB: 1955/07/13  HISTORICAL  Ann [name] is a 57 years old right-handed Caucasian female, referred by her primary care physician Ann [name] for evaluation of acute onset loss of sense of smell.  She had past medical history of hypertension, had recurrent pneumonia, most recent one was January 2014, she has mild shortness of breath at baseline, also had bilateral knee pain, gait difficulty due to knee pain.  In 2012, she suffered an episode of prolonged pneumonia, workup one morning, noticed the loss of sense of smell, loss of taste, which has been persistent since then, since January 2014, she also noticed when she coughed, sneezed, last, she had sharp transient right parietal area pain, pain behind her right eye, which can be short lasting, or lasting for whole day.  She denies visual loss, no significant memory loss, she has gait difficulty due to bilateral knee pain, she denies upper or lower extremity motor or sensory deficit  REVIEW OF SYSTEMS: Full 14 system review of systems performed and notable only for weight gain, fatigue, swelling in legs, shortness of breath, wheezing, snoring, blood in stool, diarrhea, feeling hot, feeling cold, joint pain, joint swelling, headaches, difficulty swallowing, passing out, sleepiness, snoring, not enough sleep, decreased energy  ALLERGIES: No Known Allergies  HOME MEDICATIONS: Outpatient Prescriptions Prior to Visit  Medication Sig Dispense Refill  . albuterol (PROVENTIL HFA;VENTOLIN HFA) 108 (90 BASE) MCG/ACT inhaler Inhale 2 puffs into the lungs every 4 (four) hours as needed for wheezing.  1 Inhaler  2  . amLODipine (NORVASC) 5 MG tablet Take 2 tablets (10 mg total) by mouth daily.  30 tablet  11  . aspirin EC 81 MG tablet Take 81 mg by mouth every evening.      . mometasone (NASONEX) 50 MCG/ACT nasal spray Place 2 sprays into the nose daily.  17 g  11  . omeprazole (PRILOSEC) 40 MG  capsule Take one po BID  60 capsule  1  . acetaminophen (TYLENOL) 325 MG tablet Take 650 mg by mouth every 6 (six) hours as needed.       No facility-administered medications prior to visit.    PAST MEDICAL HISTORY: Past Medical History  Diagnosis Date  . OSA (obstructive sleep apnea)   . Hypertension   . Diverticulitis   . Arthritis   . Hiatal hernia   . Esophageal stricture   . Pneumonia January 2014  . Allergy   . GERD (gastroesophageal reflux disease)   . Hyperlipidemia   . Chronic kidney disease     frequent UTIs  . TIA (transient ischemic attack)     1980, 2005, 2008  . Unspecified nutritional deficiency   . Polyneuropathy in other diseases classified elsewhere     PAST SURGICAL HISTORY: Past Surgical History  Procedure Laterality Date  . Tubal ligation    . Knee arthroscopy      x 2  . Upper gastrointestinal endoscopy    . Esophageal dilation  06,2014    x 2    FAMILY HISTORY: Family History  Problem Relation Age of Onset  . Colon cancer Father   . Esophageal cancer Neg Hx   . Rectal cancer Neg Hx   . Stomach cancer Neg Hx   . Heart disease Mother     SOCIAL HISTORY:  History   Social History  . Marital Status: Married    Spouse Name: Ann [name]    Number of Children: 3  . Years of  Education: 12   Occupational History  . sales    Social History Main Topics  . Smoking status: Never Smoker   . Smokeless tobacco: Never Used  . Alcohol Use: No  . Drug Use: No  . Sexually Active: Not on file    Social History Narrative   Patient lives at home with her husband 35 years. Ann [name]). Patient has a 12th grade education.    Right handed.    Caffeine - two daily.     PHYSICAL EXAM     01/25/13 1311  BP: 158/89  Pulse: 72  Weight: 233 lb (105.688 kg)    Body mass index is 39.97 kg/(m^2).   Generalized: In no acute distress  Neck: Supple, no carotid bruits   Cardiac: Regular rate rhythm  Pulmonary: Clear to auscultation  bilaterally  Musculoskeletal: No deformity  Neurological examination  Mentation: Alert oriented to time, place, history taking, and causual conversation  Cranial nerve II-XII: Pupils were equal round reactive to light extraocular movements were full, visual field were full on confrontational test. facial sensation and strength were normal. hearing was intact to finger rubbing bilaterally. Uvula tongue midline.  head turning and shoulder shrug and were normal and symmetric.Tongue protrusion into cheek strength was normal.  Motor: normal tone, bulk and strength.  Sensory: Intact to fine touch, pinprick, preserved vibratory sensation, and proprioception at toes.  Coordination: Normal finger to nose, heel-to-shin bilaterally there was no truncal ataxia  Gait:  atalgic cautious gait  Romberg signs: Negative  Deep tendon reflexes: Brachioradialis 2/2, biceps 2/2, triceps 2/2, patellar 2/2, Achilles 2/2, plantar responses were flexor bilaterally.   DIAGNOSTIC DATA (LABS, IMAGING, TESTING) - I reviewed patient records, labs, notes, testing and imaging myself where available.  Lab Results  Component Value Date   WBC 9.6 08/07/2012   HGB 13.7 08/07/2012   HCT 41.8 08/07/2012   MCV 97.4 08/07/2012   PLT 376 08/07/2012      Component Value Date/Time   NA 140 08/07/2012 1515   K 4.0 08/07/2012 1515   CL 101 08/07/2012 1515   CO2 29 08/07/2012 1515   GLUCOSE 93 08/07/2012 1515   BUN 10 08/07/2012 1515   CREATININE 0.80 08/07/2012 1515   CALCIUM 9.0 08/07/2012 1515   PROT 7.1 08/07/2012 1515   ALBUMIN 3.5 08/07/2012 1515   AST 14 08/07/2012 1515   ALT 19 08/07/2012 1515   ALKPHOS 68 08/07/2012 1515   BILITOT 0.4 08/07/2012 1515   GFRNONAA 82* 08/07/2012 1515   GFRAA >90 08/07/2012 1515    ASSESSMENT AND PLAN   57 yo RH WF with acute onset of loss of smell and taste since 2012, right side headaches, normal neurological exam.   1. Need to rule out right frontal structure lesion. 2. MRI brain w/wo 3. RTC in one  month   Ann [name], M.D. Ph.D.  [facility] [address] [place], [place] [address] [phone]

## 2013-02-03 DIAGNOSIS — R51 Headache: Secondary | ICD-10-CM

## 2013-02-04 ENCOUNTER — Telehealth: Payer: Self-pay | Admitting: Neurology

## 2013-02-07 ENCOUNTER — Other Ambulatory Visit: Payer: Self-pay | Admitting: Neurology

## 2013-02-07 ENCOUNTER — Telehealth: Payer: Self-pay | Admitting: Neurology

## 2013-02-07 DIAGNOSIS — E639 Nutritional deficiency, unspecified: Secondary | ICD-10-CM

## 2013-02-07 DIAGNOSIS — I10 Essential (primary) hypertension: Secondary | ICD-10-CM

## 2013-02-07 DIAGNOSIS — R05 Cough: Secondary | ICD-10-CM

## 2013-02-07 DIAGNOSIS — J189 Pneumonia, unspecified organism: Secondary | ICD-10-CM

## 2013-02-07 DIAGNOSIS — R430 Anosmia: Secondary | ICD-10-CM

## 2013-02-07 DIAGNOSIS — G63 Polyneuropathy in diseases classified elsewhere: Secondary | ICD-10-CM

## 2013-02-07 DIAGNOSIS — G4733 Obstructive sleep apnea (adult) (pediatric): Secondary | ICD-10-CM

## 2013-02-07 NOTE — Telephone Encounter (Signed)
Patient  states  her medical doctor told her she had a Brain tumor 3 months ago. And did nothing to prove his thoughts so she is very angites to get the results to her MRI. I let her know one of our reading doctors has her MRI and it should be read today.

## 2013-02-07 NOTE — Telephone Encounter (Signed)
Called patient left a message to give me a call back

## 2013-02-07 NOTE — Telephone Encounter (Signed)
Called patient and let her know her results will be ready today

## 2013-02-08 NOTE — Telephone Encounter (Signed)
Called patient and let her know her MRI was normal, she wants to where does she go from here her best contact is [phone]

## 2013-02-21 ENCOUNTER — Telehealth: Payer: Self-pay | Admitting: Neurology

## 2013-02-21 DIAGNOSIS — G4733 Obstructive sleep apnea (adult) (pediatric): Secondary | ICD-10-CM

## 2013-02-21 DIAGNOSIS — I10 Essential (primary) hypertension: Secondary | ICD-10-CM

## 2013-02-21 MED ORDER — NORTRIPTYLINE HCL 10 MG PO CAPS: 20.0000 mg | ORAL_CAPSULE | Freq: Every day | ORAL | Status: DC

## 2013-02-21 MED ORDER — RIZATRIPTAN BENZOATE 10 MG PO TBDP: 10.0000 mg | ORAL_TABLET | ORAL | Status: DC | PRN

## 2013-02-21 NOTE — Telephone Encounter (Signed)
I have called Ann [name], fail to reach her, she had lost of smell since 2012, chronic problem, She also complains of frequent headaches, right parietal region, hx of migraine, responded very well to Maxalt in the past.  I will call in Nortriptyline 20mg  qhs, maxalt as needed.  Ann [name], please give her a follow up appt with Ann [name] in one month

## 2013-02-24 ENCOUNTER — Telehealth: Payer: Self-pay | Admitting: *Deleted

## 2013-02-24 NOTE — Telephone Encounter (Signed)
Dr.Yan called  in Patient RX needs to follow up with Eber Jones in One month. See Last Telephone Call.

## 2013-02-24 NOTE — Telephone Encounter (Signed)
Message copied by Monico Blitz on Thu Feb 24, 2013  8:20 AM ------      Message from: Levert Feinstein      Created: Wed Feb 23, 2013  4:43 PM       Please call pt for normal MRI brain. ------

## 2013-02-24 NOTE — Progress Notes (Signed)
Quick Note:  Informed per Dr Terrace Arabia , normal results, patient understood ______

## 2013-03-02 ENCOUNTER — Telehealth: Payer: Self-pay | Admitting: Neurology

## 2013-03-03 NOTE — Telephone Encounter (Signed)
Maxalt was already sent to the patients pharmacy on 08/18, and they confirmed receipt of the Rx.  I have faxed them the confirmation.   rizatriptan (MAXALT-MLT) 10 MG disintegrating tablet 15 tablet 6 02/21/2013     Sig - Route: Take 1 tablet (10 mg total) by mouth as needed for migraine. May repeat in 2 hours if needed - Oral    E-Prescribing Status: Receipt confirmed by pharmacy (02/21/2013 10:34 AM EDT)                Pharmacy    [id number] - [place], [place] - [address] AT [facility] [address]

## 2013-03-15 NOTE — Telephone Encounter (Signed)
Called patient to schedule appointment but no answer. Left a message stating that she should call the office to get it set up.

## 2013-03-18 ENCOUNTER — Other Ambulatory Visit: Payer: Self-pay | Admitting: Internal Medicine

## 2013-04-13 ENCOUNTER — Ambulatory Visit: Payer: Medicaid Other | Admitting: Neurology

## 2013-04-15 ENCOUNTER — Ambulatory Visit: Payer: Medicaid Other | Admitting: Neurology

## 2013-05-10 ENCOUNTER — Other Ambulatory Visit: Payer: Self-pay | Admitting: Urology

## 2013-05-13 ENCOUNTER — Other Ambulatory Visit: Payer: Self-pay | Admitting: Internal Medicine

## 2013-05-30 ENCOUNTER — Encounter (HOSPITAL_BASED_OUTPATIENT_CLINIC_OR_DEPARTMENT_OTHER): Payer: Self-pay | Admitting: *Deleted

## 2013-05-30 NOTE — Progress Notes (Addendum)
NPO AFTER MN. ARRIVE AT [id number]. NEEDS ISTAT. CURRENT EKG IN EPIC AND CHART. WILL TAKE NORVASC, PRILOSEC, AND TOPIRAMATE AM DOS W/ SIPS OF WATER. REVIEWED [facility] GUIDELINES , WILL BRING CPAP AND MEDS.

## 2013-06-06 ENCOUNTER — Encounter (HOSPITAL_COMMUNITY)
Admission: RE | Disposition: A | Discharge status: Home / Self Care | Payer: Self-pay | Source: Ambulatory Visit | Attending: Urology

## 2013-06-06 ENCOUNTER — Observation Stay (HOSPITAL_BASED_OUTPATIENT_CLINIC_OR_DEPARTMENT_OTHER)
Admission: RE | Admit: 2013-06-06 | Discharge: 2013-06-08 | Disposition: A | Discharge status: Home / Self Care | Payer: Medicaid Other | Source: Ambulatory Visit | Attending: Urology | Admitting: Urology

## 2013-06-06 ENCOUNTER — Encounter (HOSPITAL_BASED_OUTPATIENT_CLINIC_OR_DEPARTMENT_OTHER): Payer: Medicaid Other | Admitting: Anesthesiology

## 2013-06-06 ENCOUNTER — Ambulatory Visit (HOSPITAL_BASED_OUTPATIENT_CLINIC_OR_DEPARTMENT_OTHER): Payer: Medicaid Other | Admitting: Anesthesiology

## 2013-06-06 ENCOUNTER — Encounter (HOSPITAL_BASED_OUTPATIENT_CLINIC_OR_DEPARTMENT_OTHER): Payer: Self-pay | Admitting: Anesthesiology

## 2013-06-06 DIAGNOSIS — IMO0002 Reserved for concepts with insufficient information to code with codable children: Secondary | ICD-10-CM

## 2013-06-06 DIAGNOSIS — J9611 Chronic respiratory failure with hypoxia: Secondary | ICD-10-CM | POA: Diagnosis present

## 2013-06-06 DIAGNOSIS — Z87440 Personal history of urinary (tract) infections: Secondary | ICD-10-CM | POA: Insufficient documentation

## 2013-06-06 DIAGNOSIS — R0902 Hypoxemia: Secondary | ICD-10-CM | POA: Insufficient documentation

## 2013-06-06 DIAGNOSIS — M25569 Pain in unspecified knee: Secondary | ICD-10-CM | POA: Insufficient documentation

## 2013-06-06 DIAGNOSIS — N816 Rectocele: Secondary | ICD-10-CM | POA: Insufficient documentation

## 2013-06-06 DIAGNOSIS — E559 Vitamin D deficiency, unspecified: Secondary | ICD-10-CM | POA: Insufficient documentation

## 2013-06-06 DIAGNOSIS — J9691 Respiratory failure, unspecified with hypoxia: Secondary | ICD-10-CM

## 2013-06-06 DIAGNOSIS — I10 Essential (primary) hypertension: Secondary | ICD-10-CM | POA: Insufficient documentation

## 2013-06-06 DIAGNOSIS — Z780 Asymptomatic menopausal state: Secondary | ICD-10-CM | POA: Insufficient documentation

## 2013-06-06 DIAGNOSIS — M79609 Pain in unspecified limb: Secondary | ICD-10-CM | POA: Insufficient documentation

## 2013-06-06 DIAGNOSIS — N8111 Cystocele, midline: Principal | ICD-10-CM | POA: Insufficient documentation

## 2013-06-06 DIAGNOSIS — R809 Proteinuria, unspecified: Secondary | ICD-10-CM | POA: Insufficient documentation

## 2013-06-06 DIAGNOSIS — N3020 Other chronic cystitis without hematuria: Secondary | ICD-10-CM | POA: Insufficient documentation

## 2013-06-06 DIAGNOSIS — Z79899 Other long term (current) drug therapy: Secondary | ICD-10-CM | POA: Insufficient documentation

## 2013-06-06 DIAGNOSIS — Z8673 Personal history of transient ischemic attack (TIA), and cerebral infarction without residual deficits: Secondary | ICD-10-CM | POA: Insufficient documentation

## 2013-06-06 DIAGNOSIS — Z7982 Long term (current) use of aspirin: Secondary | ICD-10-CM | POA: Insufficient documentation

## 2013-06-06 DIAGNOSIS — K219 Gastro-esophageal reflux disease without esophagitis: Secondary | ICD-10-CM | POA: Insufficient documentation

## 2013-06-06 DIAGNOSIS — G4733 Obstructive sleep apnea (adult) (pediatric): Secondary | ICD-10-CM | POA: Insufficient documentation

## 2013-06-06 DIAGNOSIS — N393 Stress incontinence (female) (male): Secondary | ICD-10-CM | POA: Insufficient documentation

## 2013-06-06 DIAGNOSIS — R9439 Abnormal result of other cardiovascular function study: Secondary | ICD-10-CM | POA: Insufficient documentation

## 2013-06-06 DIAGNOSIS — E785 Hyperlipidemia, unspecified: Secondary | ICD-10-CM | POA: Insufficient documentation

## 2013-06-06 DIAGNOSIS — J9620 Acute and chronic respiratory failure, unspecified whether with hypoxia or hypercapnia: Secondary | ICD-10-CM | POA: Diagnosis present

## 2013-06-06 HISTORY — DX: Other specified postprocedural states: Z98.890

## 2013-06-06 HISTORY — DX: Personal history of urinary (tract) infections: Z87.440

## 2013-06-06 HISTORY — DX: Stress incontinence (female) (male): N39.3

## 2013-06-06 HISTORY — DX: Nocturia: R35.1

## 2013-06-06 HISTORY — DX: Prediabetes: R73.03

## 2013-06-06 HISTORY — DX: Other chronic cystitis without hematuria: N30.20

## 2013-06-06 HISTORY — DX: Personal history of other diseases of the digestive system: Z87.19

## 2013-06-06 HISTORY — DX: Personal history of transient ischemic attack (TIA), and cerebral infarction without residual deficits: Z86.73

## 2013-06-06 HISTORY — DX: Diverticulosis of large intestine without perforation or abscess without bleeding: K57.30

## 2013-06-06 HISTORY — DX: Obstructive sleep apnea (adult) (pediatric): G47.33

## 2013-06-06 HISTORY — DX: Anxiety disorder, unspecified: F41.9

## 2013-06-06 HISTORY — PX: ANTERIOR AND POSTERIOR REPAIR: SHX5121

## 2013-06-06 HISTORY — DX: Dependence on other enabling machines and devices: Z99.89

## 2013-06-06 LAB — POCT I-STAT 4, (NA,K, GLUC, HGB,HCT)
Glucose, Bld: 101 mg/dL — ABNORMAL HIGH (ref 70–99)
HCT: 38 % (ref 36.0–46.0)
Potassium: 3.6 mEq/L (ref 3.5–5.1)
Sodium: 140 mEq/L (ref 135–145)

## 2013-06-06 SURGERY — ANTERIOR (CYSTOCELE) AND POSTERIOR REPAIR (RECTOCELE)
Anesthesia: General | Site: Vagina | Wound class: Clean Contaminated

## 2013-06-06 MED ORDER — LACTATED RINGERS IV SOLN
INTRAVENOUS | Status: DC
  Filled 2013-06-06: qty 1000

## 2013-06-06 MED ORDER — KETOROLAC TROMETHAMINE 30 MG/ML IJ SOLN
INTRAMUSCULAR | Status: DC | PRN
  Administered 2013-06-06: 30 mg via INTRAVENOUS

## 2013-06-06 MED ORDER — BUPIVACAINE-EPINEPHRINE 0.5% -1:200000 IJ SOLN
INTRAMUSCULAR | Status: DC | PRN
  Administered 2013-06-06: 28 mL

## 2013-06-06 MED ORDER — OXYCODONE HCL 5 MG PO TABS
5.0000 mg | ORAL_TABLET | ORAL | Status: DC | PRN
  Administered 2013-06-06 (×2): 5 mg via ORAL
  Filled 2013-06-06: qty 1

## 2013-06-06 MED ORDER — POLYETHYLENE GLYCOL 3350 17 GM/SCOOP PO POWD
1.0000 | Freq: Once | ORAL | Status: DC
  Filled 2013-06-06: qty 255

## 2013-06-06 MED ORDER — PROPOFOL 10 MG/ML IV BOLUS
INTRAVENOUS | Status: DC | PRN
  Administered 2013-06-06: 200 mg via INTRAVENOUS

## 2013-06-06 MED ORDER — ACETAMINOPHEN 10 MG/ML IV SOLN
INTRAVENOUS | Status: DC | PRN
  Administered 2013-06-06: 1000 mg via INTRAVENOUS

## 2013-06-06 MED ORDER — LISINOPRIL 20 MG PO TABS
20.0000 mg | ORAL_TABLET | Freq: Every day | ORAL | Status: DC
  Administered 2013-06-06 – 2013-06-08 (×3): 20 mg via ORAL
  Filled 2013-06-06 (×2): qty 1

## 2013-06-06 MED ORDER — FENTANYL CITRATE 0.05 MG/ML IJ SOLN
INTRAMUSCULAR | Status: AC
  Filled 2013-06-06: qty 2

## 2013-06-06 MED ORDER — ONDANSETRON HCL 4 MG/2ML IJ SOLN
INTRAMUSCULAR | Status: DC | PRN
  Administered 2013-06-06: 4 mg via INTRAVENOUS

## 2013-06-06 MED ORDER — ALBUTEROL SULFATE HFA 108 (90 BASE) MCG/ACT IN AERS
2.0000 | INHALATION_SPRAY | RESPIRATORY_TRACT | Status: DC | PRN
  Filled 2013-06-06: qty 6.7

## 2013-06-06 MED ORDER — TOPIRAMATE 25 MG PO TABS
50.0000 mg | ORAL_TABLET | Freq: Two times a day (BID) | ORAL | Status: DC
  Administered 2013-06-06 – 2013-06-08 (×4): 50 mg via ORAL
  Filled 2013-06-06 (×4): qty 2

## 2013-06-06 MED ORDER — PHENAZOPYRIDINE HCL 200 MG PO TABS
200.0000 mg | ORAL_TABLET | Freq: Once | ORAL | Status: AC
  Administered 2013-06-06: 200 mg via ORAL
  Filled 2013-06-06: qty 1

## 2013-06-06 MED ORDER — MIDAZOLAM HCL 2 MG/2ML IJ SOLN
0.5000 mg | INTRAMUSCULAR | Status: DC | PRN
  Filled 2013-06-06: qty 2

## 2013-06-06 MED ORDER — AMLODIPINE BESYLATE 10 MG PO TABS
10.0000 mg | ORAL_TABLET | Freq: Two times a day (BID) | ORAL | Status: DC
  Administered 2013-06-06 – 2013-06-08 (×3): 10 mg via ORAL
  Filled 2013-06-06 (×4): qty 1

## 2013-06-06 MED ORDER — MORPHINE SULFATE 4 MG/ML IJ SOLN
INTRAMUSCULAR | Status: AC
  Filled 2013-06-06: qty 1

## 2013-06-06 MED ORDER — SODIUM CHLORIDE 0.9 % IJ SOLN
INTRAMUSCULAR | Status: DC | PRN
  Administered 2013-06-06: 28 mL via INTRAVENOUS

## 2013-06-06 MED ORDER — METOCLOPRAMIDE HCL 5 MG/ML IJ SOLN
INTRAMUSCULAR | Status: DC | PRN
  Administered 2013-06-06: 10 mg via INTRAVENOUS

## 2013-06-06 MED ORDER — ONDANSETRON HCL 4 MG/2ML IJ SOLN
4.0000 mg | INTRAMUSCULAR | Status: DC | PRN
  Filled 2013-06-06: qty 2

## 2013-06-06 MED ORDER — FENTANYL CITRATE 0.05 MG/ML IJ SOLN
25.0000 ug | INTRAMUSCULAR | Status: DC | PRN
  Administered 2013-06-06 (×4): 25 ug via INTRAVENOUS
  Filled 2013-06-06: qty 1

## 2013-06-06 MED ORDER — PROMETHAZINE HCL 25 MG/ML IJ SOLN
INTRAMUSCULAR | Status: AC
  Filled 2013-06-06: qty 1

## 2013-06-06 MED ORDER — OXYCODONE HCL 5 MG PO TABS
ORAL_TABLET | ORAL | Status: AC
  Filled 2013-06-06: qty 1

## 2013-06-06 MED ORDER — BELLADONNA ALKALOIDS-OPIUM 16.2-60 MG RE SUPP
RECTAL | Status: AC
  Filled 2013-06-06: qty 1

## 2013-06-06 MED ORDER — BISACODYL 10 MG RE SUPP
10.0000 mg | Freq: Every day | RECTAL | Status: DC | PRN
  Filled 2013-06-06: qty 1

## 2013-06-06 MED ORDER — NORTRIPTYLINE HCL 10 MG PO CAPS
20.0000 mg | ORAL_CAPSULE | Freq: Every day | ORAL | Status: DC
  Administered 2013-06-06 – 2013-06-07 (×2): 20 mg via ORAL
  Filled 2013-06-06 (×3): qty 2

## 2013-06-06 MED ORDER — FENTANYL CITRATE 0.05 MG/ML IJ SOLN
INTRAMUSCULAR | Status: AC
  Filled 2013-06-06: qty 4

## 2013-06-06 MED ORDER — FENTANYL CITRATE 0.05 MG/ML IJ SOLN
INTRAMUSCULAR | Status: DC | PRN
  Administered 2013-06-06 (×6): 50 ug via INTRAVENOUS

## 2013-06-06 MED ORDER — PANTOPRAZOLE SODIUM 40 MG PO TBEC
40.0000 mg | DELAYED_RELEASE_TABLET | Freq: Every day | ORAL | Status: DC
  Administered 2013-06-08: 10:00:00 40 mg via ORAL
  Filled 2013-06-06 (×2): qty 1

## 2013-06-06 MED ORDER — DEXAMETHASONE SODIUM PHOSPHATE 4 MG/ML IJ SOLN
INTRAMUSCULAR | Status: DC | PRN
  Administered 2013-06-06: 10 mg via INTRAVENOUS

## 2013-06-06 MED ORDER — METRONIDAZOLE 0.75 % VA GEL
VAGINAL | Status: DC | PRN
  Administered 2013-06-06: 1 via VAGINAL

## 2013-06-06 MED ORDER — SODIUM CHLORIDE 0.9 % IR SOLN
Status: DC | PRN
  Administered 2013-06-06: 10:00:00

## 2013-06-06 MED ORDER — CIPROFLOXACIN HCL 500 MG PO TABS
500.0000 mg | ORAL_TABLET | Freq: Two times a day (BID) | ORAL | Status: DC
  Administered 2013-06-06 – 2013-06-08 (×4): 500 mg via ORAL
  Filled 2013-06-06 (×4): qty 1

## 2013-06-06 MED ORDER — MIDAZOLAM HCL 2 MG/2ML IJ SOLN
INTRAMUSCULAR | Status: AC
  Filled 2013-06-06: qty 2

## 2013-06-06 MED ORDER — CIPROFLOXACIN HCL 500 MG PO TABS
ORAL_TABLET | ORAL | Status: AC
  Filled 2013-06-06: qty 1

## 2013-06-06 MED ORDER — PROMETHAZINE HCL 25 MG/ML IJ SOLN
6.2500 mg | INTRAMUSCULAR | Status: DC | PRN
  Administered 2013-06-06: 6.25 mg via INTRAVENOUS
  Filled 2013-06-06: qty 1

## 2013-06-06 MED ORDER — LIDOCAINE HCL (CARDIAC) 20 MG/ML IV SOLN
INTRAVENOUS | Status: DC | PRN
  Administered 2013-06-06: 60 mg via INTRAVENOUS

## 2013-06-06 MED ORDER — MIDAZOLAM HCL 5 MG/5ML IJ SOLN
INTRAMUSCULAR | Status: DC | PRN
  Administered 2013-06-06: 2 mg via INTRAVENOUS

## 2013-06-06 MED ORDER — SODIUM CHLORIDE 0.45 % IV SOLN
INTRAVENOUS | Status: DC
  Administered 2013-06-06 – 2013-06-07 (×3): via INTRAVENOUS
  Filled 2013-06-06: qty 1000

## 2013-06-06 MED ORDER — RIZATRIPTAN BENZOATE 10 MG PO TBDP: 10.0000 mg | ORAL_TABLET | ORAL | Status: DC | PRN

## 2013-06-06 MED ORDER — OXYCODONE-ACETAMINOPHEN 5-325 MG PO TABS
1.0000 | ORAL_TABLET | ORAL | Status: DC | PRN
  Filled 2013-06-06: qty 2

## 2013-06-06 MED ORDER — LACTATED RINGERS IV SOLN
INTRAVENOUS | Status: DC
  Administered 2013-06-06: 09:00:00 via INTRAVENOUS
  Filled 2013-06-06: qty 1000

## 2013-06-06 MED ORDER — SUCCINYLCHOLINE CHLORIDE 20 MG/ML IJ SOLN
INTRAMUSCULAR | Status: DC | PRN
  Administered 2013-06-06: 160 mg via INTRAVENOUS

## 2013-06-06 MED ORDER — CLONAZEPAM 0.5 MG PO TABS
0.5000 mg | ORAL_TABLET | Freq: Three times a day (TID) | ORAL | Status: DC | PRN
  Filled 2013-06-06: qty 1

## 2013-06-06 MED ORDER — MORPHINE SULFATE 2 MG/ML IJ SOLN
1.0000 mg | INTRAMUSCULAR | Status: DC | PRN
  Administered 2013-06-06 – 2013-06-07 (×5): 2 mg via INTRAVENOUS
  Filled 2013-06-06: qty 1

## 2013-06-06 MED ORDER — BELLADONNA ALKALOIDS-OPIUM 16.2-60 MG RE SUPP
RECTAL | Status: DC | PRN
  Administered 2013-06-06: 1 via RECTAL

## 2013-06-06 MED ORDER — PHENAZOPYRIDINE HCL 100 MG PO TABS
ORAL_TABLET | ORAL | Status: AC
  Filled 2013-06-06: qty 2

## 2013-06-06 MED ORDER — CEFAZOLIN SODIUM-DEXTROSE 2-3 GM-% IV SOLR
INTRAVENOUS | Status: AC
  Filled 2013-06-06: qty 50

## 2013-06-06 MED ORDER — CEFAZOLIN SODIUM-DEXTROSE 2-3 GM-% IV SOLR
2.0000 g | INTRAVENOUS | Status: AC
  Administered 2013-06-06: 2 g via INTRAVENOUS
  Filled 2013-06-06: qty 50

## 2013-06-06 MED ORDER — CIPROFLOXACIN HCL 250 MG PO TABS
ORAL_TABLET | ORAL | Status: AC
Start: 2013-06-06 — End: 2013-06-06
  Filled 2013-06-06: qty 1

## 2013-06-06 MED ORDER — STERILE WATER FOR IRRIGATION IR SOLN
Status: DC | PRN
  Administered 2013-06-06: 1000 mL

## 2013-06-06 MED ORDER — BELLADONNA ALKALOIDS-OPIUM 16.2-60 MG RE SUPP
1.0000 | Freq: Four times a day (QID) | RECTAL | Status: DC | PRN
  Administered 2013-06-06 – 2013-06-08 (×4): 1 via RECTAL
  Filled 2013-06-06 (×3): qty 1

## 2013-06-06 SURGICAL SUPPLY — 62 items
BAG URINE DRAINAGE (UROLOGICAL SUPPLIES) ×3 IMPLANT
BLADE SURG 10 STRL SS (BLADE) ×3 IMPLANT
BLADE SURG 15 STRL LF DISP TIS (BLADE) ×2 IMPLANT
BLADE SURG 15 STRL SS (BLADE) ×1
BLADE SURG ROTATE 9660 (MISCELLANEOUS) ×3 IMPLANT
BOOTIES KNEE HIGH SLOAN (MISCELLANEOUS) ×3 IMPLANT
CANISTER SUCTION 2500CC (MISCELLANEOUS) ×6 IMPLANT
CATH FOLEY 2WAY SLVR  5CC 16FR (CATHETERS) ×1
CATH FOLEY 2WAY SLVR 5CC 16FR (CATHETERS) ×2 IMPLANT
CLOTH BEACON ORANGE TIMEOUT ST (SAFETY) ×3 IMPLANT
COVER MAYO STAND STRL (DRAPES) ×3 IMPLANT
COVER TABLE BACK 60X90 (DRAPES) ×3 IMPLANT
DERMABOND ADVANCED (GAUZE/BANDAGES/DRESSINGS)
DERMABOND ADVANCED .7 DNX12 (GAUZE/BANDAGES/DRESSINGS) IMPLANT
DEVICE CAPIO SLIM BOX (INSTRUMENTS) IMPLANT
DEVICE CAPIO SLIM SINGLE (INSTRUMENTS) IMPLANT
DEVICE CAPIO SUTURING (INSTRUMENTS)
DEVICE CAPIO SUTURING OPC (INSTRUMENTS) IMPLANT
DISSECTOR ROUND CHERRY 3/8 STR (MISCELLANEOUS) IMPLANT
DRAPE CAMERA CLOSED 9X96 (DRAPES) ×3 IMPLANT
DRAPE UNDERBUTTOCKS STRL (DRAPE) ×3 IMPLANT
FLOSEAL 10ML (HEMOSTASIS) IMPLANT
GAUZE SPONGE 4X4 16PLY XRAY LF (GAUZE/BANDAGES/DRESSINGS) IMPLANT
GLOVE BIO SURGEON STRL SZ7.5 (GLOVE) ×6 IMPLANT
GLOVE BIOGEL PI IND STRL 7.5 (GLOVE) ×6 IMPLANT
GLOVE BIOGEL PI INDICATOR 7.5 (GLOVE) ×3
GOWN PREVENTION PLUS LG XLONG (DISPOSABLE) ×3 IMPLANT
GOWN STRL REIN XL XLG (GOWN DISPOSABLE) ×6 IMPLANT
NEEDLE 1/2 CIR CATGUT .05X1.09 (NEEDLE) ×3 IMPLANT
NEEDLE HYPO 22GX1.5 SAFETY (NEEDLE) ×6 IMPLANT
PACKING VAGINAL (PACKING) ×3 IMPLANT
PENCIL BUTTON HOLSTER BLD 10FT (ELECTRODE) ×3 IMPLANT
PLUG CATH AND CAP STER (CATHETERS) ×3 IMPLANT
RETRACTOR LONRSTAR 16.6X16.6CM (MISCELLANEOUS) ×2 IMPLANT
RETRACTOR STAY HOOK 5MM (MISCELLANEOUS) ×3 IMPLANT
RETRACTOR STER APS 16.6X16.6CM (MISCELLANEOUS) ×3
SET IRRIG Y TYPE TUR BLADDER L (SET/KITS/TRAYS/PACK) ×3 IMPLANT
SHEET LAVH (DRAPES) ×3 IMPLANT
SLING SOLYX SYSTEM SIS BX (SLING) IMPLANT
SLING SOLYX SYSTEM SIS EA (Sling) ×3 IMPLANT
SPONGE LAP 4X18 X RAY DECT (DISPOSABLE) ×3 IMPLANT
SUCTION FRAZIER TIP 10 FR DISP (SUCTIONS) ×6 IMPLANT
SUT ABS MONO DBL WITH NDL 48IN (SUTURE) IMPLANT
SUT CAPIO POLYGLYCOLIC (SUTURE) IMPLANT
SUT ETHILON 2 0 PS N (SUTURE) IMPLANT
SUT MON AB 2-0 SH 27 (SUTURE)
SUT MON AB 2-0 SH27 (SUTURE) IMPLANT
SUT NONABSORB MONO DB W/NDL 48 (SUTURE) IMPLANT
SUT PDS AB 3-0 SH 27 (SUTURE) IMPLANT
SUT SILK 3 0 PS 1 (SUTURE) IMPLANT
SUT VIC AB 0 CT1 36 (SUTURE) IMPLANT
SUT VIC AB 2-0 CT1 27 (SUTURE)
SUT VIC AB 2-0 CT1 TAPERPNT 27 (SUTURE) IMPLANT
SUT VIC AB 2-0 UR6 27 (SUTURE) ×18 IMPLANT
SYR BULB IRRIGATION 50ML (SYRINGE) ×3 IMPLANT
SYR CONTROL 10ML LL (SYRINGE) ×3 IMPLANT
SYRINGE 10CC LL (SYRINGE) ×3 IMPLANT
SYS PRF KIT VAG SUP UPHOLD (Sling) ×3 IMPLANT
TRAY DSU PREP LF (CUSTOM PROCEDURE TRAY) ×3 IMPLANT
TUBE CONNECTING 12X1/4 (SUCTIONS) ×6 IMPLANT
WATER STERILE IRR 500ML POUR (IV SOLUTION) ×3 IMPLANT
YANKAUER SUCT BULB TIP NO VENT (SUCTIONS) IMPLANT

## 2013-06-06 NOTE — Transfer of Care (Signed)
Immediate Anesthesia Transfer of Care Note  Patient: Ann [name]  Procedure(s) Performed: Procedure(s): Anterior vaginal vault repair, Sacrospinous ligament fixation with UPHOLD lite, Kelly plication, Sacrospinous mesh fixation, Solyx transurethral sling (N/A)  Patient Location: PACU  Anesthesia Type:General  Level of Consciousness: awake and oriented  Airway & Oxygen Therapy: Patient Spontanous Breathing and Patient connected to nasal cannula oxygen  Post-op Assessment: Report given to PACU RN  Post vital signs: Reviewed and stable  Complications: No apparent anesthesia complications

## 2013-06-06 NOTE — H&P (Signed)
story of Present Illness 57 yo married female referred by Dr.Osei-Bonsu for further evaluation of recurrent UTI's. She has urgency, frequency, dysuria, nocturia, hesitancy, weak flow, and straining to void. She is married, and is P 3-3-0. She is post menopausal, and no hx of hysterectomy.   Past Medical History Problems  1. History of Anxiety (300.00) 2. History of arthritis (V13.4) 3. History of gastroesophageal reflux (GERD) (V12.79) 4. History of hiatal hernia (V12.79) 5. History of hyperlipidemia (V12.29) 6. History of hypertension (V12.59) 7. History of neuropathy (V12.49) 8. History of stroke (V12.54) 9. History of Obstructive sleep apnea, adult (327.23) 10. History of Vitamin D deficiency (268.9)  Surgical History Problems  1. History of Esophageal Dilation 2. History of Knee Arthroscopy 3. History of Tubal Ligation  Current Meds 1. AmLODIPine Besylate 10 MG Oral Tablet;  Therapy: (Recorded:27Oct2014) to Recorded 2. Aspirin 81 MG Oral Tablet;  Therapy: (Recorded:27Oct2014) to Recorded 3. Nortriptyline HCl - 10 MG Oral Capsule;  Therapy: (Recorded:27Oct2014) to Recorded 4. Topiramate 50 MG Oral Tablet;  Therapy: (Recorded:27Oct2014) to Recorded  Allergies Medication  1. No Known Drug Allergies  Family History Problems  1. Family history of Deceased : Father, Mother 2. Family history of cardiac disorder (V17.49) : Mother 3. Family history of malignant neoplasm (V16.9) : Father 4. Family history of tuberculosis (V18.8)  Social History Problems    Alcohol use   Caffeine use (305.90)   1 per day   Married   Never smoker (V49.89)   No alcohol use   Number of children   1 son, 2 daughters  Review of Systems Genitourinary, constitutional, skin, eye, otolaryngeal, hematologic/lymphatic, cardiovascular, pulmonary, endocrine, musculoskeletal, gastrointestinal, neurological and psychiatric system(s) were reviewed and pertinent findings if present are noted.   Genitourinary: urinary frequency, urinary urgency, dysuria, nocturia, urinary hesitancy, urinary stream starts and stops, incomplete emptying of bladder, hematuria and initiating urination requires straining.  Gastrointestinal: nausea, vomiting, heartburn, diarrhea and constipation.  Constitutional: night sweats, feeling tired (fatigue) and recent weight loss, but no fever.  Integumentary: no new skin rashes or lesions and no pruritus.  Eyes: no blurred vision and no diplopia.  ENT: no sore throat and no sinus problems.  Hematologic/Lymphatic: no tendency to easily bruise and no swollen glands.  Cardiovascular: leg swelling, but no chest pain.  Respiratory: shortness of breath.  Endocrine: polydipsia.  Musculoskeletal: no back pain and no joint pain.  Neurological: dizziness and headache.  Psychiatric: anxiety, but no depression.    Vitals Vital Signs [Data Includes: Last 1 Day]  Recorded: 27Oct2014 04:04PM  Height: 5 ft 4 in Weight: 224 lb  BMI Calculated: 38.45 BSA Calculated: 2.05 Blood Pressure: 132 / 87 Heart Rate: 102  Physical Exam Constitutional: Well nourished and well developed . No acute distress.  ENT:. The ears and nose are normal in appearance.  Neck: The appearance of the neck is normal and no neck mass is present.  Pulmonary: No respiratory distress . No accessory muscle use.  Cardiovascular:. No peripheral edema.  Abdomen: The abdomen is obese, but not distended. The abdomen is soft and nontender. No masses are palpated. No CVA tenderness. Bowel sounds are normal. No hernias are palpable. No hepatosplenomegaly noted.  Genitourinary:  Chaperone Present: kim lewis.  Examination of the external genitalia shows normal female external genitalia, no vulvar atrophy, no condyloma acuminatum and no labial adhesions. The urethra is normal in appearance, not tender, does not appear stenotic and no urethral caruncle. Urethral hypermobility is present. There is no urethral  discharge.  No periurethral cyst is identified. There is no urethral prolapse. Vaginal exam demonstrates the vaginal epithelium to be poorly estrogenized, but no abnormalities and no tenderness. A cystocele is present with a midline defect (grade Stage 3 /4). No enterocele is identified. No rectocele is identified. The cervix is is without abnormalities. The uterus is without abnormalities. The adnexa are palpably normal. The bladder is normal on palpation, non tender, not distended and without masses.  Lymphatics: The femoral and inguinal nodes are not enlarged or tender.  Skin: Normal skin turgor, no visible rash and no visible skin lesions.  Neuro/Psych:. Mood and affect are appropriate.    Results/Data  02 May 2013 3:35 PM  UA With REFLEX    COLOR YELLOW     APPEARANCE CLEAR     SPECIFIC GRAVITY 1.015     pH 5.5     GLUCOSE NEG     BILIRUBIN NEG     KETONE NEG     BLOOD LARGE     PROTEIN 30     UROBILINOGEN 0.2     NITRITE NEG     LEUKOCYTE ESTERASE SMALL     SQUAMOUS EPITHELIAL/HPF RARE     WBC 0-2     CRYSTALS NONE SEEN     CASTS NONE SEEN     RBC 7-10     BACTERIA RARE     Procedure Gaynell Face test: PVR - 50cc and instilled 200cc of sterile water.     Assessment Assessed  1. Sleep apnea (780.57) 2. Female stress incontinence (625.6) 3. Microhematuria (599.72) 4. Isolated proteinuria (791.0) 5. 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 Chronic cystitis (595.2) 7. Nocturia (788.43) 8. Atrophic vaginitis (627.3) 9. Elevated hemoglobin A1c (790.29)  1, Elevated Hg A1c: ? need for Metformin. Hx of A1c 6.1  2. Stage 3 vault prolapse, urethral hypermobility-needs urodynamics. We have discussed how to repair her vaginal vault: with possible repair, or augmented repair or mesh it sacrospinalis fixation. We have discussed the national lawsuits over vaginal mesh, and also her sleep apnea problems. We discussed the risk difference between staying in a 23 hr center where she would be  the center of attention, vs the hospital, where she would be one of many patients the nurses would be caring for. With fresh repair, I would rather have her in the 23 hr recovery unt at [facility]. She wil need 1 night o frecovery, then 1 week without drving, and 3 months of internal healing.   3. Sleep apnea-bring [facility] hospital for her surgery.   4. Microhematuria and proteinuria.   Plan Female stress incontinence  1. Follow-up Schedule Surgery Office  Follow-up  Status: Complete  Done: 27Oct2014 2. [employer]; Status:Complete;   Done: 27Oct2014 3. [facility]ODYNAMICS; Status:Hold For - Date of Service; Requested for:06Nov2014;   Videourodynamics  Schedule for surgical intervention.   Discussion/Summary cc: Jackie Plum , [facility]   cc: Dr. Fannie Knee     Signatures Electronically signed by : Jethro Bolus, M.D.; May 04 2013  6:10PM EST

## 2013-06-06 NOTE — Anesthesia Procedure Notes (Signed)
Procedure Name: Intubation Date/Time: 06/06/2013 9:53 AM Performed by: Norva Pavlov Pre-anesthesia Checklist: Patient identified, Emergency Drugs available, Suction available and Patient being monitored Patient Re-evaluated:Patient Re-evaluated prior to inductionOxygen Delivery Method: Circle System Utilized Preoxygenation: Pre-oxygenation with 100% oxygen Intubation Type: IV induction Ventilation: Mask ventilation without difficulty Laryngoscope Size: Mac and 3 Grade View: Grade II Tube type: Oral Tube size: 7.0 mm Number of attempts: 1 Airway Equipment and Method: stylet and oral airway Placement Confirmation: ETT inserted through vocal cords under direct vision,  positive ETCO2 and breath sounds checked- equal and bilateral Secured at: 21 cm Tube secured with: Tape Dental Injury: Teeth and Oropharynx as per pre-operative assessment

## 2013-06-06 NOTE — Progress Notes (Signed)
Complained feeling faint with IV stick, and tasting metal in mouth.

## 2013-06-06 NOTE — Anesthesia Preprocedure Evaluation (Addendum)
Anesthesia Evaluation    History of Anesthesia Complications (+) Family history of anesthesia reaction and history of anesthetic complications (Patient states that sister died following general anesthesia with complications thought to be related to undiagnosed OSA.)  Airway Mallampati: III TM Distance: <3 FB   Mouth opening: Limited Mouth Opening  Dental  (+) Poor Dentition, Missing and Dental Advisory Given   Pulmonary sleep apnea and Continuous Positive Airway Pressure Ventilation ,  Patient states history of frequent pneumonia; denies respiratory symptoms the past 10 months. breath sounds clear to auscultation  Pulmonary exam normal       Cardiovascular hypertension, Pt. on medications Rhythm:Regular Rate:Normal     Neuro/Psych Anxiety TIA Neuromuscular disease    GI/Hepatic hiatal hernia, GERD-  Medicated,  Endo/Other  diabetes (Borderline DM)  Renal/GU      Musculoskeletal   Abdominal   Peds  Hematology   Anesthesia Other Findings   Reproductive/Obstetrics                          Anesthesia Physical Anesthesia Plan  ASA: III  Anesthesia Plan: General   Post-op Pain Management:    Induction: Intravenous  Airway Management Planned: Oral ETT  Additional Equipment:   Intra-op Plan:   Post-operative Plan: Extubation in OR  Informed Consent: I have reviewed the patients History and Physical, chart, labs and discussed the procedure including the risks, benefits and alternatives for the proposed anesthesia with the patient or authorized representative who has indicated his/her understanding and acceptance.   Dental advisory given  Plan Discussed with: CRNA  Anesthesia Plan Comments:        Anesthesia Quick Evaluation

## 2013-06-06 NOTE — Interval H&P Note (Signed)
History and Physical Interval Note:  06/06/2013 9:05 AM  Ann [name]  has presented today for surgery, with the diagnosis of Anterior Vault Prolapse, Stress Incontinence  The various methods of treatment have been discussed with the patient and family. After consideration of risks, benefits and other options for treatment, the patient has consented to  Procedure(s): ANTERIOR (CYSTOCELE) AND POSTERIOR REPAIR (RECTOCELE) (N/A) PUBO-VAGINAL SLING POSSIBLE LYNX VS SOLYX SLING (N/A) as a surgical intervention .  The patient's history has been reviewed, patient examined, no change in status, stable for surgery.  I have reviewed the patient's chart and labs.  Questions were answered to the patient's satisfaction.     [name]

## 2013-06-06 NOTE — Op Note (Signed)
Pre-operative diagnosis :   Stage III anterior vault prolapse with historical stress urinary incontinence  Postoperative diagnosis:  Same  Operation:  Anterior vaginal vault repair and Kelly plication using [employer] uphold sacrospinous mesh fixation; Solyx mid-urethral sling.  Surgeon:  Kathie Rhodes. Patsi Sears, MD  First assistant: Marlou Porch  Anesthesia: General LMA  Preparation: After appropriate preanesthesia, the patient was brought to the operating room, placed in the upper table in the dorsal supine position where general LMA anesthesia was introduced. Armband was double checked. History was double checked. She was replaced in the dorsal lithotomy position where the pubis was prepped with Betadine solution and draped in usual fashion.  Review history:  Sleep apnea (780.57)  2. Female stress incontinence (625.6)  3. Microhematuria (599.72)  4. Isolated proteinuria (791.0)  5. 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 Chronic cystitis (595.2)  7. Nocturia (788.43)  8. Atrophic vaginitis (627.3)  9. Elevated hemoglobin A1c (790.29)  1, Elevated Hg A1c: ? need for Metformin. Hx of A1c 6.1  2. Stage 3 vault prolapse, urethral hypermobility-needs urodynamics. We have discussed how to repair her vaginal vault: with possible repair, or augmented repair or mesh it sacrospinalis fixation. We have discussed the national lawsuits over vaginal mesh, and also her sleep apnea problems. We discussed the risk difference between staying in a 23 hr center where she would be the center of attention, vs the hospital, where she would be one of many patients the nurses would be caring for. With fresh repair, I would rather have her in the 23 hr recovery unt at [facility]. She wil need 1 night o frecovery, then 1 week without drving, and 3 months of internal healing.  3. Sleep apnea-bring [facility] hospital for her surgery.  4. Microhematuria and proteinuria.    Statement of  Likelihood of Success: Excellent. TIME-OUT  observed.:  Procedure:  Vaginal inspection revealed stage III anterior vault cystocele to the vaginal introitus. There was a mild rectocele, but this was not thought to be significant.  Foley cath was placed, post UA speculum was placed. The mid urethra was identified, and the bladder neck was identified. A horizontal mark was made with a blue pen 2 cm proximal to the urethrovaginal junction. 60 cc of Marcaine 25 with epinephrine 1 200,000 was injected subcutaneously to create Hydro dissection, as well as postoperative analgesia. A 5 cm vaginal incision is made and subcutaneous tissue is dissected with the Strully scissors. The large cystocele is then bluntly and sharply dissected proximalward and distalward. This was accomplished using a 4 x 4 mesh, as well as scissors. The pelvic floor was then dissected with sharp and blunt dissection. The ischial spines were identified bilaterally. The sacrospinous ligaments were identified bilaterally.  A small Kelly plication was accomplished using 2-0 Vicryl horizontal mattress sutures. With a finger in direct contact with the sacrospinous ligament, and using the Cappio device, sutures are placed through the sacrospinous ligament bilaterally, one finger breath medial to the ischial spines. Following this, the uphold light mesh was maneuvered into place with the midline noted. Tacking sutures were then accomplished both proximally and distalward. The mesh was smooth against the urethra bilaterally. The plastic sleeves were then removed in standard fashion. No bleeding was noted. A small amount of distal vaginal tissue was excised to create a fresh edge, and after antibiotic irrigation, the wound was closed with running 2-0 Vicryl suture.  Vaginal inspection revealed that the small rectocele with was again noted, and was felt that with the contraction of  the anterior vault, that the rectocele may prove to not need surgical repair.  Attention was then directed to the  mid ureter, which was marked with a marking pen. 10 cc of Marcaine 25 with epinephrine 1 200,000 was injected in the periurethral space and vaginal incision is made. Subcutaneous tissue is then dissected to the level of the pubis. Using a previously marked Solyx sling, the mesh was placed into the right operator introduced, and then the left obturator internus. The mesh was detached. Inspection revealed pillowing of the mesh. Cystoscopy revealed urine from both orifices. No evidence of trauma to the bladder or the ureter was noted. Foley cath was replaced, and vaginal packing was placed, and the patient was awakened and taken to recovery room in good condition.

## 2013-06-06 NOTE — Anesthesia Postprocedure Evaluation (Signed)
Anesthesia Post Note  Patient: Ann [name]  Procedure(s) Performed: Procedure(s) (LRB): Anterior vaginal vault repair, Sacrospinous ligament fixation with UPHOLD lite, Kelly plication, Sacrospinous mesh fixation, Solyx transurethral sling (N/A)  Anesthesia type: General  Patient location: PACU  Post pain: Pain level controlled  Post assessment: Post-op Vital signs reviewed  Last Vitals:  Filed Vitals:   06/06/13 1330  BP: 132/72  Pulse: 103  Temp:   Resp: 30    Post vital signs: Reviewed  Level of consciousness: sedated  Complications: No apparent anesthesia complications

## 2013-06-06 NOTE — OR Nursing (Signed)
Dangled on side of bed tolerated well. Once standing to ambulate, became pale and nauseated. Back to bed.

## 2013-06-07 ENCOUNTER — Observation Stay (HOSPITAL_COMMUNITY): Payer: Medicaid Other

## 2013-06-07 ENCOUNTER — Encounter (HOSPITAL_BASED_OUTPATIENT_CLINIC_OR_DEPARTMENT_OTHER): Payer: Self-pay | Admitting: Urology

## 2013-06-07 DIAGNOSIS — R0902 Hypoxemia: Secondary | ICD-10-CM

## 2013-06-07 DIAGNOSIS — G4733 Obstructive sleep apnea (adult) (pediatric): Secondary | ICD-10-CM

## 2013-06-07 DIAGNOSIS — I10 Essential (primary) hypertension: Secondary | ICD-10-CM

## 2013-06-07 DIAGNOSIS — J9620 Acute and chronic respiratory failure, unspecified whether with hypoxia or hypercapnia: Secondary | ICD-10-CM | POA: Diagnosis present

## 2013-06-07 DIAGNOSIS — J9611 Chronic respiratory failure with hypoxia: Secondary | ICD-10-CM | POA: Diagnosis present

## 2013-06-07 LAB — CBC
HCT: 26.6 % — ABNORMAL LOW (ref 36.0–46.0)
Hemoglobin: 8.9 g/dL — ABNORMAL LOW (ref 12.0–15.0)
MCHC: 33.5 g/dL (ref 30.0–36.0)
Platelets: 301 10*3/uL (ref 150–400)
RDW: 14 % (ref 11.5–15.5)

## 2013-06-07 LAB — CREATININE, SERUM
Creatinine, Ser: 1.1 mg/dL (ref 0.50–1.10)
GFR calc non Af Amer: 55 mL/min — ABNORMAL LOW (ref >90)

## 2013-06-07 LAB — TROPONIN I: Troponin I: <0.3 ng/mL (ref <0.30)

## 2013-06-07 MED ORDER — INFLUENZA VAC SPLIT QUAD 0.5 ML IM SUSP
0.5000 mL | INTRAMUSCULAR | Status: AC
  Administered 2013-06-08: 10:00:00 0.5 mL via INTRAMUSCULAR
  Filled 2013-06-07 (×2): qty 0.5

## 2013-06-07 MED ORDER — CIPROFLOXACIN HCL 500 MG PO TABS
ORAL_TABLET | ORAL | Status: AC
  Filled 2013-06-07: qty 1

## 2013-06-07 MED ORDER — POLYETHYLENE GLYCOL 3350 17 G PO PACK
17.0000 g | PACK | Freq: Every day | ORAL | Status: DC
  Administered 2013-06-08: 10:00:00 17 g via ORAL
  Filled 2013-06-07 (×2): qty 1

## 2013-06-07 MED ORDER — DIPHENHYDRAMINE HCL 12.5 MG/5ML PO ELIX
12.5000 mg | ORAL_SOLUTION | Freq: Four times a day (QID) | ORAL | Status: DC | PRN
  Filled 2013-06-07: qty 10

## 2013-06-07 MED ORDER — CIPROFLOXACIN HCL 500 MG PO TABS
500.0000 mg | ORAL_TABLET | Freq: Two times a day (BID) | ORAL | Status: DC
  Filled 2013-06-07 (×3): qty 1

## 2013-06-07 MED ORDER — ONDANSETRON HCL 4 MG/2ML IJ SOLN
4.0000 mg | INTRAMUSCULAR | Status: DC | PRN
  Filled 2013-06-07: qty 2

## 2013-06-07 MED ORDER — ENOXAPARIN SODIUM 40 MG/0.4ML ~~LOC~~ SOLN
40.0000 mg | SUBCUTANEOUS | Status: DC
  Administered 2013-06-07 – 2013-06-08 (×2): 40 mg via SUBCUTANEOUS
  Filled 2013-06-07 (×4): qty 0.4

## 2013-06-07 MED ORDER — MORPHINE SULFATE 4 MG/ML IJ SOLN
INTRAMUSCULAR | Status: AC
  Filled 2013-06-07: qty 1

## 2013-06-07 MED ORDER — ZOLPIDEM TARTRATE 5 MG PO TABS
5.0000 mg | ORAL_TABLET | Freq: Every evening | ORAL | Status: DC | PRN
  Filled 2013-06-07: qty 1

## 2013-06-07 MED ORDER — KETOROLAC TROMETHAMINE 30 MG/ML IJ SOLN
INTRAMUSCULAR | Status: AC
  Filled 2013-06-07: qty 1

## 2013-06-07 MED ORDER — IOHEXOL 350 MG/ML SOLN
100.0000 mL | Freq: Once | INTRAVENOUS | Status: AC | PRN
  Administered 2013-06-07: 100 mL via INTRAVENOUS

## 2013-06-07 MED ORDER — OXYCODONE HCL 5 MG PO TABS
5.0000 mg | ORAL_TABLET | ORAL | Status: DC | PRN
  Administered 2013-06-07 – 2013-06-08 (×4): 5 mg via ORAL
  Filled 2013-06-07 (×4): qty 1

## 2013-06-07 MED ORDER — KETOROLAC TROMETHAMINE 15 MG/ML IJ SOLN
15.0000 mg | Freq: Four times a day (QID) | INTRAMUSCULAR | Status: DC
  Administered 2013-06-07 – 2013-06-08 (×4): 15 mg via INTRAVENOUS
  Filled 2013-06-07 (×8): qty 1

## 2013-06-07 MED ORDER — ZOLPIDEM TARTRATE 5 MG PO TABS: 5.0000 mg | ORAL_TABLET | Freq: Every evening | ORAL | Status: DC | PRN

## 2013-06-07 MED ORDER — HYOSCYAMINE SULFATE 0.125 MG SL SUBL
0.1250 mg | SUBLINGUAL_TABLET | SUBLINGUAL | Status: DC | PRN
  Filled 2013-06-07: qty 1

## 2013-06-07 MED ORDER — DIPHENHYDRAMINE HCL 50 MG/ML IJ SOLN
12.5000 mg | Freq: Four times a day (QID) | INTRAMUSCULAR | Status: DC | PRN
  Filled 2013-06-07: qty 0.5

## 2013-06-07 NOTE — Progress Notes (Signed)
sao2 varying 84% to 94%  Dr.Tannenbaum in spoke with patient over concerns reguarding SAO2.   Dr.Tannenbaum will return alittle later to check patient and consider admission to main hospital.

## 2013-06-07 NOTE — Progress Notes (Signed)
Patient transferred from short stay to 1426, alert and oriented, C/O bladder spasm, PRN spasm medication given.  Denies any SOB, oxygen saturation 97%-RA, on continuous pulse OX.  Oriented patient to room and unit, reviewed plan of care with patient. Will continue to assess patient. No wound noted, skin intact.

## 2013-06-07 NOTE — Progress Notes (Signed)
Pt's home machine and cord are in good condition and working well. [employer] will be contacted in the morning to inspect the home unit. Pt was placed on home CPAP machine with 2lpm of oxygen bled in. Her home settings are unknown, as they are programmed into the unit. RN notified. RT will continue to monitor as needed.

## 2013-06-07 NOTE — Consult Note (Addendum)
[facility] Medical Consultation  MATISSE ROSKELLEY WUJ:[id number] DOB: Aug 19, 1955 DOA: 06/06/2013 PCP: Jackie Plum, MD   Requesting physician: Dr Cassell Smiles Date of consultation: 12/2 Reason for consultation: for hypoxia  Impression/Recommendations Active Problems: Hypoxia -in pt with OSA s/p surgery with periop sedatives and pain meds/opiates possibly contributing to resp suppression leading to increased desatting with sleep -will also obtain CXR, EKG, and d-dimer, troponins to further eval>>follow and further manage accordingly -supplemental O2, continue CPAP QHS, prn bronchodilators -recommend minimizing narcotics/resp suppressing meds  -She is followed by Dr Craige Cotta follow and consult as clinically appropriate Sleep apnea -As above, continue CPAP HTN -continue outpt meds- lisinopril and norvasc GERD -continue PPI h/o vault prolapse with historical stress urinary incontinence -per primary, s/p pelvic floor surgery      [facility] will followup again tomorrow. Please contact me if I can be of assistance in the meanwhile. Thank you for this consultation.   HPI:  Pt is a 57yo obese female with past medical history of OSA (obstructive sleep apnea) on CPAP followed by Dr Craige Cotta; GERD, Hypertension, TIA s/p pelvic floor surgery/anterior vaginal vault repair with mid-urethral sling per Dr Cassell Smiles yesterday 12/1 and overnight was noted to be desating to the 70s when sleeping and today whenever she sleeps/nods off. Per urology she was placed on opiate containing D&O suppositories post op. Pt denies chest, cough, fevers and states that she has not felt SOB.She was directly admitted from the [facility] recovery center per urology and [facility] consulted for hypoxia. Her O2 sats upon arrival to floor 96% on room air.    Review of Systems:  The patient denies anorexia, fever, weight loss, vision loss, decreased hearing, hoarseness, chest pain, syncope, peripheral edema, balance deficits,  hemoptysis, abdominal pain, melena, hematochezia, severe indigestion/heartburn, hematuria, incontinence, genital sores, muscle weakness, suspicious skin lesions, transient blindness, depression, unusual weight change, abnormal bleeding  Past Medical History  Diagnosis Date  . Hypertension   . Arthritis   . GERD (gastroesophageal reflux disease)   . Hyperlipidemia   . History of recurrent UTIs   . H/O hiatal hernia   . History of esophageal dilatation   . History of TIAs NO RESIDUAL    1980;  2005;   2008 PT STATES PER CT  SCARRING RIGHT SIDE OF BRAIN  . Diverticulosis of colon   . SUI (stress urinary incontinence, female)   . Chronic cystitis   . Vaginal vault prolapse   . OSA on CPAP     PER SLEEP STUDY 09-08-2012  MODERATE OSA  . Borderline diabetes   . Nocturia   . Anxiety    Past Surgical History  Procedure Laterality Date  . Knee arthroscopy Bilateral 2008  . Esophageal dilation  X2  LAST ONE  JUNE 2014  . Tubal ligation  1987  . Anterior and posterior repair N/A 06/06/2013    Procedure: Anterior vaginal vault repair, Sacrospinous ligament fixation with UPHOLD lite, Kelly plication, Sacrospinous mesh fixation, Solyx transurethral sling;  Surgeon: Kathi Ludwig, MD;  Location: [facility];  Service: Urology;  Laterality: N/A;   Social History:  reports that she has never smoked. She has never used smokeless tobacco. She reports that she does not drink alcohol or use illicit drugs.  No Known Allergies Family History  Problem Relation Age of Onset  . Colon cancer Father   . Esophageal cancer Neg Hx   . Rectal cancer Neg Hx   . Stomach cancer Neg Hx   . Heart disease Mother  Prior to Admission medications   Medication Sig Start Date End Date Taking? Authorizing Provider  albuterol (PROVENTIL HFA;VENTOLIN HFA) 108 (90 BASE) MCG/ACT inhaler Inhale 2 puffs into the lungs every 4 (four) hours as needed for wheezing. 08/01/12  Yes Birgit Nowling Joselyn Glassman, MD   amLODipine (NORVASC) 10 MG tablet Take 10 mg by mouth 2 (two) times daily.   Yes Historical Provider, MD  aspirin EC 81 MG tablet Take 81 mg by mouth every evening.   Yes Historical Provider, MD  clonazePAM (KLONOPIN) 0.5 MG tablet Take 0.5 mg by mouth 3 (three) times daily as needed for anxiety.   Yes Historical Provider, MD  lisinopril (PRINIVIL,ZESTRIL) 20 MG tablet Take 20 mg by mouth daily.   Yes Historical Provider, MD  Multiple Vitamins-Minerals (MULTIVITAL) CHEW Chew by mouth daily.   Yes Historical Provider, MD  nortriptyline (PAMELOR) 10 MG capsule Take 2 capsules (20 mg total) by mouth at bedtime. Take one tab po qhs xone week, then 02/21/13  Yes Levert Feinstein, MD  omeprazole (PRILOSEC) 40 MG capsule TAKE 1 CAPSULE BY MOUTH TWICE DAILY 03/18/13  Yes Hart Carwin, MD  Polyethylene Glycol 3350 (MIRALAX PO) Take by mouth daily.   Yes Historical Provider, MD  topiramate (TOPAMAX) 50 MG tablet Take 50 mg by mouth 2 (two) times daily.   Yes Historical Provider, MD  rizatriptan (MAXALT-MLT) 10 MG disintegrating tablet Take 1 tablet (10 mg total) by mouth as needed for migraine. May repeat in 2 hours if needed 02/21/13   Levert Feinstein, MD   Physical Exam: Blood pressure 104/61, pulse 90, temperature 97.8 F (36.6 C), temperature source Oral, resp. rate 20, height 5\' 4"  (1.626 m), weight 103.874 kg (229 lb), SpO2 98.00%. Filed Vitals:   06/07/13 1558  BP: 104/61  Pulse: 90  Temp: 97.8 F (36.6 C)  Resp: 20    Constitutional: Vital signs reviewed.  Patient is a well-developed and obese in no acute distress and cooperative with exam. Alert and oriented x3.  Head: Normocephalic and atraumatic Mouth: no erythema or exudates, MMM Eyes: PERRL, EOMI, conjunctivae normal, No scleral icterus.  Neck: Supple, Trachea midline normal ROM, No JVD, mass, thyromegaly, or carotid bruit present.  Cardiovascular: RRR, S1 normal, S2 normal, no MRG, pulses symmetric and intact bilaterally Pulmonary/Chest: normal  respiratory effort, decreased BS at bases, no wheezes, rales, or rhonchi Abdominal: Soft.  Mild tenderness, non-distended, bowel sounds are normal, no masses, organomegaly, or guarding present.  Extremities: no cyanosis and no edema Neurological: A&O x3, Strength is normal and symmetric bilaterally, cranial nerve II-XII are grossly intact, no focal motor deficit, sensory intact to light touch bilaterally.  Skin: Warm, dry and intact. No rash, cyanosis, or clubbing.  Psychiatric: Normal mood and affect. speech and behavior is normal. Judgment and thought content normal. Cognition and memory are normal.     Labs on Admission:  Basic Metabolic Panel:  Recent Labs Lab 06/06/13 0926 06/07/13 1530  NA 140  --   K 3.6  --   GLUCOSE 101*  --   CREATININE  --  1.10   Liver Function Tests: No results found for this basename: AST, ALT, ALKPHOS, BILITOT, PROT, ALBUMIN,  in the last 168 hours No results found for this basename: LIPASE, AMYLASE,  in the last 168 hours No results found for this basename: AMMONIA,  in the last 168 hours CBC:  Recent Labs Lab 06/06/13 0926 06/07/13 1530  WBC  --  10.0  HGB 12.9 8.9*  HCT 38.0  26.6*  MCV  --  97.4  PLT  --  301   Cardiac Enzymes: No results found for this basename: CKTOTAL, CKMB, CKMBINDEX, TROPONINI,  in the last 168 hours BNP: No components found with this basename: POCBNP,  CBG: No results found for this basename: GLUCAP,  in the last 168 hours  Radiological Exams on Admission: No results found.    Time spent: >64mins  Kela Millin [facility] Pager [phone]  If 7PM-7AM, please contact night-coverage [email or web address] Password [email or web address] 06/07/2013, 5:00 PM

## 2013-06-07 NOTE — Progress Notes (Signed)
Urology Progress Note  1 Day Post-Op anterior pelvic floor repair, and trans urethral sling.  1. Sleep apnea: pt continued to drop sats during the night, despite C-pap, and O23 added-berrer oxygenation when C-pap removed. Her sister died post op" from undiagnosed sleep apnea"-and pt is scared that that could happen to her. Chest clear this AM. Color good.  Abd benign, but complaining of vaginal pain. Also c/o knee pain ( pre-op TKA bilaterally). She has not vooidid today. Packing out.    Subjective:     No acute urologic events overnight. Ambulation:   positive Flatus:    positive Bowel movement  negative  Pain: some relief  Objective:  Blood pressure 105/65, pulse 90, temperature 97.9 F (36.6 C), temperature source Oral, resp. rate 18, height 5\' 4"  (1.626 m), weight 100.699 kg (222 lb), SpO2 98.00%.  Physical Exam:  General:  No acute distress, awake Resp: clear to auscultation bilaterally Genitourinary:  Wnl.  Foley:  Out.     I/O last 3 completed shifts: In: 3930 [P.O.:1040; I.V.:2890] Out: 1400 [Urine:1400]  Recent Labs     06/06/13  0926  HGB  12.9    Recent Labs     06/06/13  0926  NA  140  K  3.6     No results found for this basename: PT, INR, APTT,  in the last 72 hours   No components found with this basename: ABG,   Assessment/Plan:  1. P tasks for admission because she is scared of her respiratory status. She is a pt of Dr. Craige Cotta and [facility]. In addition, she has not voided yet.( no desire) 2. Will admit for pulmonary evaluation.  3. Cath if unable to void.

## 2013-06-07 NOTE — Progress Notes (Signed)
Pt c/o lower abd pressure.  Morphine 2mg  IV given at 0507 and again at 0547 per pt's request.  Pt states relief after the 2nd dose.  Foley catheter and vaginal packing removed per dr's order at 0555.  Old blood noted on packing only.

## 2013-06-07 NOTE — Progress Notes (Signed)
Patient ambulated x 4 without difficulty

## 2013-06-08 LAB — CBC
HCT: 27.6 % — ABNORMAL LOW (ref 36.0–46.0)
Hemoglobin: 8.8 g/dL — ABNORMAL LOW (ref 12.0–15.0)
MCH: 31.8 pg (ref 26.0–34.0)
MCHC: 31.9 g/dL (ref 30.0–36.0)
RBC: 2.77 MIL/uL — ABNORMAL LOW (ref 3.87–5.11)
WBC: 8.6 10*3/uL (ref 4.0–10.5)

## 2013-06-08 LAB — BASIC METABOLIC PANEL
CO2: 23 mEq/L (ref 19–32)
Calcium: 8.7 mg/dL (ref 8.4–10.5)
Chloride: 102 mEq/L (ref 96–112)
Creatinine, Ser: 1.35 mg/dL — ABNORMAL HIGH (ref 0.50–1.10)
Glucose, Bld: 106 mg/dL — ABNORMAL HIGH (ref 70–99)
Sodium: 135 mEq/L (ref 135–145)

## 2013-06-08 LAB — TROPONIN I: Troponin I: <0.3 ng/mL (ref <0.30)

## 2013-06-08 MED ORDER — LEVOFLOXACIN 500 MG PO TABS
500.0000 mg | ORAL_TABLET | Freq: Every day | ORAL | Status: DC
  Filled 2013-06-08: qty 1

## 2013-06-08 MED ORDER — ENSURE COMPLETE PO LIQD: 237.0000 mL | Freq: Once | ORAL | Status: DC | PRN

## 2013-06-08 MED ORDER — TRAMADOL-ACETAMINOPHEN 37.5-325 MG PO TABS: 1.0000 | ORAL_TABLET | Freq: Four times a day (QID) | ORAL | Status: DC | PRN

## 2013-06-08 MED ORDER — CIPROFLOXACIN HCL 500 MG PO TABS: 500.0000 mg | ORAL_TABLET | Freq: Two times a day (BID) | ORAL | Status: DC

## 2013-06-08 NOTE — Progress Notes (Signed)
Urology Progress Note  2 Days Post-Op   Subjective:     No acute urologic events overnight. Ambulation:   negative Flatus:    positive Bowel movement  positive  Pain: some relief  Objective:  Blood pressure 125/49, pulse 92, temperature 98.2 F (36.8 C), temperature source Oral, resp. rate 20, height 5\' 4"  (1.626 m), weight 103.874 kg (229 lb), SpO2 97.00%.  Physical Exam:  General:  No acute distress, awake Resp: clear to auscultation bilaterally Genitourinary:  wnl  Foley: out. Spontaneous void    I/O last 3 completed shifts: In: 5600 [P.O.:2360; I.V.:3240] Out: 2250 [Urine:2250]  Recent Labs     06/07/13  1530  06/08/13  0415  HGB  8.9*  8.8*  WBC  10.0  8.6  PLT  301  280    Recent Labs     06/06/13  0926  06/07/13  1530  06/08/13  0415  NA  140   --   135  K  3.6   --   3.8  CL   --    --   102  CO2   --    --   23  BUN   --    --   25*  CREATININE   --   1.10  1.35*  CALCIUM   --    --   8.7  GFRNONAA   --   55*  43*  GFRAA   --   64*  50*     No results found for this basename: PT, INR, APTT,  in the last 72 hours   No components found with this basename: ABG,   Assessment/Plan: Some incontinence-and some pelvic discomfort-less today. She will be re-evaluated in the office after she recovers.    She has sleep apnea and desaturates when she does not wear the face mask. She understands and will be discharged today if her leg u.s is norma..   Continue any current medications. Allow d/c if her ler u/s is wnl.

## 2013-06-08 NOTE — Progress Notes (Signed)
VASCULAR LAB PRELIMINARY  PRELIMINARY  PRELIMINARY  PRELIMINARY  Bilateral lower extremity venous duplex  completed.    Preliminary report:  Bilateral:  No evidence of DVT, superficial thrombosis, or Baker's Cyst.    Ja Pistole, RVT 06/08/2013, 2:36 PM

## 2013-06-08 NOTE — Discharge Summary (Signed)
Physician Discharge Summary  Patient ID: Ann [name] MRN: [id number] DOB/AGE: 1956/02/18 57 y.o. Cc: Dr. Craige Cotta Admit date: 06/06/2013 Discharge date: 06/08/2013  Admission Diagnoses: Anterior Vault Prolapse, Stress Incontinence  Discharge Diagnoses:  Active Problems:   Cystocele   Respiratory failure with hypoxia   Respiratory failure, acute-on-chronic   Discharged Condition: stable. Using O2.   Hospital Course:  Vaginal vault prolapse repair. Respiratory failure treatment.   Consults: [facility]. Significant Diagnostic Studies: Ct Angio Chest Pe W/cm &/or Wo Cm  06/07/2013   CLINICAL DATA:  Postop day 1. Hypoxia and decreased section saturation especially when lying down. Evaluate for pulmonary embolus. Elevated D-dimer.  EXAM: CT ANGIOGRAPHY CHEST WITH CONTRAST  TECHNIQUE: Multidetector CT imaging of the chest was performed using the standard protocol during bolus administration of intravenous contrast. Multiplanar CT image reconstructions including MIPs were obtained to evaluate the vascular anatomy.  CONTRAST:  OMNIPAQUE IOHEXOL 350 MG/ML SOLN  COMPARISON:  07/29/2005  FINDINGS: Pulmonary arteries are well opacified by contrast bolus. There is no evidence for acute pulmonary embolus. Heart size is normal. There are no focal consolidations, pleural effusions, or pulmonary nodules. No mediastinal, hilar, or axillary adenopathy. The visualized portion of the thyroid gland has a normal appearance. Degenerative changes are seen in the spine.  Images of the upper abdomen are unremarkable.  Review of the MIP images confirms the above findings.  IMPRESSION: 1. Technically adequate exam showing no evidence for acute pulmonary embolus. 2. No evidence for acute abnormality.   Electronically Signed   By: Rosalie Gums M.D.   On: 06/07/2013 21:56   Dg Chest Port 1 View  06/07/2013   CLINICAL DATA:  Postop urinary bladder surgery, now with hypoxia  EXAM: PORTABLE CHEST - 1 VIEW   COMPARISON:  08/07/2012; 07/27/2012  FINDINGS: Examination is degraded due to patient body habitus and portable technique.  Grossly unchanged enlarged cardiac silhouette and mediastinal contours. There is grossly unchanged mild diffuse slightly nodular thickening of the pulmonary interstitium. No focal airspace opacities. No pleural effusion or pneumothorax. No definite evidence of edema. Grossly unchanged bones.  IMPRESSION: Mild bronchitic change without definite acute cardiopulmonary disease on this AP portable examination. Further evaluation with a PA and lateral chest radiograph may be obtained as clinically indicated.   Electronically Signed   By: Simonne Come M.D.   On: 06/07/2013 18:08    Treatments: IV hydration, antibiotics: Ancef, anticoagulation: LMW heparin, respiratory therapy: O2 and surgery: ( as above)  Discharge Exam: Blood pressure 125/49, pulse 92, temperature 98.2 F (36.8 C), temperature source Oral, resp. rate 20, height 5\' 4"  (1.626 m), weight 103.874 kg (229 lb), SpO2 97.00%. General appearance: alert and cooperative  Disposition: 01-Home or Self Care  Discharge Orders   Future Appointments Provider Department Dept Phone   06/23/2013 2:30 PM Brock Bad, MD [facility] [phone]   Future Orders Complete By Expires   Discharge patient  As directed    Discontinue IV  As directed        Medication List         albuterol 108 (90 BASE) MCG/ACT inhaler  Commonly known as:  PROVENTIL HFA;VENTOLIN HFA  Inhale 2 puffs into the lungs every 4 (four) hours as needed for wheezing.     amLODipine 10 MG tablet  Commonly known as:  NORVASC  Take 10 mg by mouth 2 (two) times daily.     aspirin EC 81 MG tablet  Take 81 mg by mouth every evening.  ciprofloxacin 500 MG tablet  Commonly known as:  CIPRO  Take 1 tablet (500 mg total) by mouth 2 (two) times daily.     clonazePAM 0.5 MG tablet  Commonly known as:  KLONOPIN  Take 0.5 mg by mouth 3 (three)  times daily as needed for anxiety.     lisinopril 20 MG tablet  Commonly known as:  PRINIVIL,ZESTRIL  Take 20 mg by mouth daily.     MIRALAX PO  Take by mouth daily.     MULTIVITAL Chew  Chew by mouth daily.     nortriptyline 10 MG capsule  Commonly known as:  PAMELOR  Take 2 capsules (20 mg total) by mouth at bedtime. Take one tab po qhs xone week, then     omeprazole 40 MG capsule  Commonly known as:  PRILOSEC  TAKE 1 CAPSULE BY MOUTH TWICE DAILY     rizatriptan 10 MG disintegrating tablet  Commonly known as:  MAXALT-MLT  Take 1 tablet (10 mg total) by mouth as needed for migraine. May repeat in 2 hours if needed     topiramate 50 MG tablet  Commonly known as:  TOPAMAX  Take 50 mg by mouth 2 (two) times daily.     traMADol-acetaminophen 37.5-325 MG per tablet  Commonly known as:  ULTRACET  Take 1 tablet by mouth every 6 (six) hours as needed.           Follow-up Information   Follow up with [name], MD. (per appointment)    Specialty:  Urology   Contact information:   [address] [place] [address] [phone]      Pt stable. She will be dixcharged if her LE exam is normal today.  SignedJethro Bolus I 06/08/2013, 1:25 PM

## 2013-06-08 NOTE — Progress Notes (Signed)
Picked up patient at 2300. Received report from Chloe RN. Agree with previous assessment. Will continue to monitor closely. Merline Perkin C, RN  

## 2013-06-08 NOTE — Progress Notes (Signed)
UR completed 

## 2013-06-08 NOTE — Progress Notes (Signed)
Pt states CPAP is not comfortable with additional oxygen. She stated she has not been able to sleep well with the new machine since she has gotten it a few weeks ago. Patient verbalized that she would rather just wear 2L [place] for the night. O2 sats continue to be WNL. Selena Batten, RT notified. Will continue to monitor closely. Fraser Din, RN

## 2013-06-08 NOTE — Consult Note (Signed)
[facility] Medical Consultation  Ann [name] ZOX:[id number] DOB: 08/05/1955 DOA: 06/06/2013 PCP: Jackie Plum, MD   Requesting physician: Dr Cassell Smiles Date of consultation: 12/2 Reason for consultation: for hypoxia  Subjective today - upset today that she is now more incontinent that she was prior to her surgery and cannot make it to the bathroom.   Impression/Recommendations Hypoxia-in pt with OSA and obesity hypoventilation s/p surgery with periop sedatives and pain meds/opiates possibly contributing to resp suppression leading to increased desatting with sleep. She has been feeling short of breath at home as well. She is having a hard time using the CPAP at home with her current settings. She has been noted to desat previously when she dozes off. Sleep study on 10/11/2012 showed "apnea/hypopnea index of 12.2, and oxygen saturation nadir of 79%".  - will check whether she needs O2 with ambulation and ask the nurse to walk the patient without O2. She might need home O2 with activity. Patient remembers one instance when she ambulated in clinic and was told that she might need home O2. - CTPA negative.  - ecommend minimizing narcotics/resp suppressing meds  - he is followed by Dr Craige Cotta follow and consult as clinically appropriate - CXR with some bronchitic changes and patient with a cough, I changed her Ciprofloxacin to Levofloxacin, will have a bit better respiratory coverage. She will need 3-5 days total for her bronchitis.  Elevated D dimer - CTPA negative - complains of calf tenderness will evaluate with Dopplers.  Sleep apnea -As above, continue CPAP - she is a mouth breather and needs a CPAP for adequate breathing while asleep. Counseled about that today. Patient did not use her CPAP all night last night and was on nasal cannula alone.  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 Asked nursing staff to check O2 levels with ambulation, may needs home O2.   [facility] will followup again tomorrow. Please contact me if I can be of assistance in the meanwhile. Thank you for this consultation.  Physical Exam: Blood pressure 125/49, pulse 92, temperature 98.2 F (36.8 C), temperature source Oral, resp. rate 20, height 5\' 4"  (1.626 m), weight 103.874 kg (229 lb), SpO2 97.00%. Filed Vitals:   06/08/13 0456  BP: 125/49  Pulse: 92  Temp: 98.2 F (36.8 C)  Resp: 20   Constitutional: Vital signs reviewed.  Patient is a well-developed and obese in no acute distress and cooperative with exam. Alert and oriented x3.  Head: Normocephalic and atraumatic Mouth: no erythema or exudates, MMM Eyes: PERRL, EOMI, conjunctivae normal, No scleral icterus.  Neck: Supple, Trachea midline normal ROM, No JVD, mass, thyromegaly, or carotid bruit present.  Cardiovascular: RRR, S1 normal, S2 normal, no MRG, pulses symmetric and intact bilaterally Pulmonary/Chest: normal respiratory effort, decreased BS at bases, no wheezes, rales, or rhonchi Abdominal: Soft.  Mild tenderness, non-distended, bowel sounds are normal, no masses, organomegaly, or guarding present.  Extremities: no cyanosis and no edema Neurological: A&O x3, Strength is normal and symmetric bilaterally, cranial nerve II-XII are grossly intact, no focal motor deficit, sensory intact to light touch bilaterally.  Skin: Warm, dry and intact. No rash, cyanosis, or clubbing.  Psychiatric: Normal mood and affect. speech and behavior is normal. Judgment and thought content normal. Cognition and memory are normal.   Labs on Admission:  Basic Metabolic  Panel:  Recent Labs Lab 06/06/13 0926 06/07/13 1530 06/08/13 0415  NA 140  --  135  K 3.6  --  3.8  CL  --   --  102  CO2  --   --  23  GLUCOSE 101*  --  106*  BUN  --   --  25*  CREATININE  --   1.10 1.35*  CALCIUM  --   --  8.7   CBC:  Recent Labs Lab 06/06/13 0926 06/07/13 1530 06/08/13 0415  WBC  --  10.0 8.6  HGB 12.9 8.9* 8.8*  HCT 38.0 26.6* 27.6*  MCV  --  97.4 99.6  PLT  --  301 280   Cardiac Enzymes:  Recent Labs Lab 06/07/13 2309 06/08/13 0415  TROPONINI <0.30 <0.30   Rdiological Exams on Admission: Ct Angio Chest Pe W/cm &/or Wo Cm  06/07/2013   CLINICAL DATA:  Postop day 1. Hypoxia and decreased section saturation especially when lying down. Evaluate for pulmonary embolus. Elevated D-dimer.  EXAM: CT ANGIOGRAPHY CHEST WITH CONTRAST  TECHNIQUE: Multidetector CT imaging of the chest was performed using the standard protocol during bolus administration of intravenous contrast. Multiplanar CT image reconstructions including MIPs were obtained to evaluate the vascular anatomy.  CONTRAST:  OMNIPAQUE IOHEXOL 350 MG/ML SOLN  COMPARISON:  07/29/2005  FINDINGS: Pulmonary arteries are well opacified by contrast bolus. There is no evidence for acute pulmonary embolus. Heart size is normal. There are no focal consolidations, pleural effusions, or pulmonary nodules. No mediastinal, hilar, or axillary adenopathy. The visualized portion of the thyroid gland has a normal appearance. Degenerative changes are seen in the spine.  Images of the upper abdomen are unremarkable.  Review of the MIP images confirms the above findings.  IMPRESSION: 1. Technically adequate exam showing no evidence for acute pulmonary embolus. 2. No evidence for acute abnormality.   Electronically Signed   By: Rosalie Gums M.D.   On: 06/07/2013 21:56   Dg Chest Port 1 View  06/07/2013   CLINICAL DATA:  Postop urinary bladder surgery, now with hypoxia  EXAM: PORTABLE CHEST - 1 VIEW  COMPARISON:  08/07/2012; 07/27/2012  FINDINGS: Examination is degraded due to patient body habitus and portable technique.  Grossly unchanged enlarged cardiac silhouette and mediastinal contours. There is grossly unchanged mild  diffuse slightly nodular thickening of the pulmonary interstitium. No focal airspace opacities. No pleural effusion or pneumothorax. No definite evidence of edema. Grossly unchanged bones.  IMPRESSION: Mild bronchitic change without definite acute cardiopulmonary disease on this AP portable examination. Further evaluation with a PA and lateral chest radiograph may be obtained as clinically indicated.   Electronically Signed   By: Simonne Come M.D.   On: 06/07/2013 18:08   Time spent: 35 mins for chart review, patient examination and family discussions.   Pamella Pert [facility] Pager [phone]  If 7PM-7AM, please contact night-coverage [email or web address] Password [facility] 06/08/2013, 12:43 PM

## 2013-06-08 NOTE — Progress Notes (Signed)
Nutrition Brief Note  Patient identified on the Malnutrition Screening Tool (MST) Report  Wt Readings from Last 15 Encounters:  06/07/13 229 lb (103.874 kg)  06/07/13 229 lb (103.874 kg)  01/25/13 233 lb (105.688 kg)  10/11/12 234 lb (106.142 kg)  09/14/12 230 lb (104.327 kg)  09/08/12 227 lb (102.967 kg)  08/17/12 227 lb (102.967 kg)  08/13/12 234 lb (106.142 kg)  08/07/12 227 lb (102.967 kg)  08/04/12 231 lb (104.781 kg)  07/28/12 235 lb 3.7 oz (106.7 kg)    Body mass index is 39.29 kg/(m^2). Patient meets criteria for Obesity based on current BMI. Pt claims 17 lb weight loss in the past 3 months due to eating healthier and decreased appetite but, weight history shows weight has been fairly stable over the past 11 months. Pt states she drinks Ensure supplements occasionally if she is unable to eat. Encouraged continued healthful eating.   Current diet order is Regular, patient is consuming approximately 75-100% of meals at this time. Labs and medications reviewed.   No nutrition interventions warranted at this time. If nutrition issues arise, please consult RD.   Ian Malkin RD, LDN Inpatient Clinical Dietitian Pager: [phone] After Hours Pager: [phone]

## 2013-06-08 NOTE — Progress Notes (Signed)
Ambulated pt without oxygen.  Oxygen sats at rest were 97%, they remained no lower than 93% during ambulation.  Pt did complain of shortness of breath during ambulation and had to stop several times.  HR at rest was 90 and it rose to 131 during ambulation.  MD notified.

## 2013-06-23 ENCOUNTER — Ambulatory Visit: Payer: Medicaid Other | Admitting: Obstetrics

## 2013-07-21 ENCOUNTER — Telehealth: Payer: Self-pay | Admitting: Internal Medicine

## 2013-07-21 NOTE — Telephone Encounter (Signed)
Patient reports she had a vaginal prolapse repair in December by Dr. Gaynelle Arabian. She saw him for a "rectal bulge" that she noticed while in the shower and was told to see Dr. Olevia Perches ASAP. Scheduled with Tye Savoy, NP tomorrow at 1:30 PM.

## 2013-07-22 ENCOUNTER — Encounter: Payer: Self-pay | Admitting: Nurse Practitioner

## 2013-07-22 ENCOUNTER — Ambulatory Visit (INDEPENDENT_AMBULATORY_CARE_PROVIDER_SITE_OTHER): Payer: Medicaid Other | Admitting: Nurse Practitioner

## 2013-07-22 VITALS — BP 112/78 | HR 78 | Ht 63.5 in | Wt 218.5 lb

## 2013-07-22 DIAGNOSIS — K5900 Constipation, unspecified: Secondary | ICD-10-CM

## 2013-07-22 NOTE — Progress Notes (Signed)
     History of Present Illness:   Patient is a 58 year old female known to Dr. Olevia Perches for a history of an esophageal stricture 2006 and Feb 2014. We treated her for an anal fissure in January 2014.  Patient now referred by urology for evaluation of rectocele. Patient is s/p anterior vaginal vault repair early December. A small rectocele was noted intraoperatively but not felt to require repair.  Post-operatively patient did well. Three days ago patient noticed vaginal bulge after a severe bout of constipation. She was seen by urology.  Sutures were intact, no disruption of vaginal vault repair. I don't have Urology's office note but patient tells me she was sent here for evaluation of rectocele.      Current Medications, Allergies, Past Medical History, Past Surgical History, Family History and Social History were reviewed in [employer] record.  Physical Exam: General: Pleasant, well developed , white female in no acute distress Head: Normocephalic and atraumatic Eyes:  sclerae anicteric, conjunctiva pink  Ears: Normal auditory acuity Lungs: Clear throughout to auscultation Heart: Regular rate and rhythm Abdomen: Soft, non distended, non-tender. No masses, no hepatomegaly. Normal bowel sounds Rectal: No rectocele appreciated on digital rectal exam.  Musculoskeletal: Symmetrical with no gross deformities  Extremities: No edema  Neurological: Alert oriented x 4, grossly nonfocal Psychological:  Alert and cooperative. Normal mood and affect  Assessment and Recommendations:  67. 58 year old female, s/p repair of vaginal vault prolapse early December. Patient noticed a large bulge in vagina a couple of days ago. Urology evaluated her and apparently sutures were intact, no disruption of recent surgical repair. Patient tells me she was referred here for evaluation of a rectocele ( a small rectocele was noted intraoperatively). I have requested office notes from [facility] to get a better understanding of what is going on. For constipation patient will use Miralax 1-2 times a day. She can use Dulcolax supp as needed for constipation.   2. Colon cancer screening. This was discussed at last year's visit but there was some concerns about recent respiratory problems. Patient will need a screening colonoscopy once she has fully recovered from recent urologic surgery.

## 2013-07-23 ENCOUNTER — Encounter: Payer: Self-pay | Admitting: Nurse Practitioner

## 2013-07-23 DIAGNOSIS — K5900 Constipation, unspecified: Secondary | ICD-10-CM | POA: Insufficient documentation

## 2013-07-25 NOTE — Progress Notes (Signed)
I have also seen the patient and performed DRE with female chaperone.  Cannot appreciate a typical rectocele, ? broader based rectocele perhaps.  Outside of laxative use for constipation cannot see anything else to do at this time from GI perspective but would recommend GYN evaluation when wppropriate.  Gatha Mayer, MD, [facility]

## 2013-08-01 ENCOUNTER — Telehealth: Payer: Self-pay

## 2013-08-01 NOTE — Telephone Encounter (Signed)
The pharmacy sent [place] a fax saying the patients ins formulary has changed.  They will not cover Maxalt.  The only triptan covered under the plan is Sumatriptan.  Would you like to change med?  Please advise.  Thank you.

## 2013-08-01 NOTE — Telephone Encounter (Signed)
I could not find her in our system. Please call patient, if she wants refill of her triptan, she needs to schedule a follow up with our office

## 2013-08-02 ENCOUNTER — Other Ambulatory Visit (INDEPENDENT_AMBULATORY_CARE_PROVIDER_SITE_OTHER): Payer: Self-pay | Admitting: General Surgery

## 2013-08-02 ENCOUNTER — Telehealth: Payer: Self-pay | Admitting: Internal Medicine

## 2013-08-02 ENCOUNTER — Telehealth (INDEPENDENT_AMBULATORY_CARE_PROVIDER_SITE_OTHER): Payer: Self-pay | Admitting: General Surgery

## 2013-08-02 ENCOUNTER — Encounter (INDEPENDENT_AMBULATORY_CARE_PROVIDER_SITE_OTHER): Payer: Self-pay | Admitting: General Surgery

## 2013-08-02 ENCOUNTER — Ambulatory Visit (INDEPENDENT_AMBULATORY_CARE_PROVIDER_SITE_OTHER): Payer: Medicaid Other | Admitting: General Surgery

## 2013-08-02 ENCOUNTER — Ambulatory Visit (INDEPENDENT_AMBULATORY_CARE_PROVIDER_SITE_OTHER): Payer: Medicaid Other | Admitting: Obstetrics

## 2013-08-02 VITALS — BP 162/101 | HR 74 | Temp 98.6°F | Resp 14 | Ht 63.5 in | Wt 217.6 lb

## 2013-08-02 VITALS — BP 144/90 | HR 102 | Temp 98.2°F | Wt 218.0 lb

## 2013-08-02 DIAGNOSIS — K5900 Constipation, unspecified: Secondary | ICD-10-CM

## 2013-08-02 DIAGNOSIS — K625 Hemorrhage of anus and rectum: Secondary | ICD-10-CM

## 2013-08-02 DIAGNOSIS — N816 Rectocele: Secondary | ICD-10-CM

## 2013-08-02 DIAGNOSIS — K623 Rectal prolapse: Secondary | ICD-10-CM

## 2013-08-02 DIAGNOSIS — K561 Intussusception: Secondary | ICD-10-CM

## 2013-08-02 NOTE — Telephone Encounter (Signed)
Spoke with patient and she states she saw Dr. Marcello Moores today and was told she needed to have a colonoscopy because she had never had one.. Patient states she does not have rectal bleeding or constipation at this time that this was a problem a long time ago. She had anterior vaginal vault repair on 06/06/13 by Dr. Gaynelle Arabian. She saw Tye Savoy, NP on 07/22/13 for a rectal bulge. She will check with Dr. Gaynelle Arabian to see if he approves her having a colonoscopy at this time. She did have some problems with 02 saturation on EGD last time. Dr. Olevia Perches, does she need OV prior to procedure or can she be direct? [facility] ? Or hospital? Please, advise,

## 2013-08-02 NOTE — Progress Notes (Addendum)
Chief Complaint  Patient presents with  . Other    HISTORY: Ann [name] is [name] 58 y.o. female who presents to the office with inability to have [name] BM.  Other symptoms include pain with BM's.  This had been occurring for [name] couple years and she notes it got worse after cystocele and rectocele repair by Ann [name].  She has tried miralax, dulcolax, mag citrate in the past with no success.  Constipation makes the symptoms worse.   It is intermittent in nature.  Her bowel habits are irregluar and her bowel movements are usually hard.  Her fiber intake is through [name] pill.  She has never had [name] colonoscopy.  She has seen Ann Ann [name] for this as well.  She c/o [name] bulge in her vagina when she tries to have [name] BM.       Past Medical History  Diagnosis Date  . Hypertension   . Arthritis   . GERD (gastroesophageal reflux disease)   . Hyperlipidemia   . History of recurrent UTIs   . H/O hiatal hernia   . History of esophageal dilatation   . History of TIAs NO RESIDUAL    1980;  2005;   2008 PT STATES PER CT  SCARRING RIGHT SIDE OF BRAIN  . Diverticulosis of colon   . SUI (stress urinary incontinence, female)   . Chronic cystitis   . Vaginal vault prolapse   . OSA on CPAP     PER SLEEP STUDY 09-08-2012  MODERATE OSA  . Borderline diabetes   . Nocturia   . Anxiety   . Rectal prolapse       Past Surgical History  Procedure Laterality Date  . Knee arthroscopy Bilateral 2008  . Esophageal dilation  X2  LAST ONE  JUNE 2014  . Tubal ligation  1987  . Anterior and posterior repair N/[name] 06/06/2013    Procedure: Anterior vaginal vault repair, Sacrospinous ligament fixation with UPHOLD lite, Kelly plication, Sacrospinous mesh fixation, Solyx transurethral sling;  Surgeon: Ann Rud, MD;  Location: [facility];  Service: Urology;  Laterality: N/[name];        Current Outpatient Prescriptions  Medication Sig Dispense Refill  . albuterol (PROVENTIL HFA;VENTOLIN HFA) 108 (90 BASE) MCG/ACT  inhaler Inhale 2 puffs into the lungs every 4 (four) hours as needed for wheezing.  1 Inhaler  2  . amLODipine (NORVASC) 10 MG tablet Take 10 mg by mouth 2 (two) times daily.      Marland Kitchen aspirin EC 81 MG tablet Take 81 mg by mouth every evening.      . ciprofloxacin (CIPRO) 500 MG tablet Take 1 tablet (500 mg total) by mouth 2 (two) times daily.  10 tablet  0  . clonazePAM (KLONOPIN) 0.5 MG tablet Take 0.5 mg by mouth 3 (three) times daily as needed for anxiety.      Marland Kitchen lisinopril (PRINIVIL,ZESTRIL) 20 MG tablet Take 20 mg by mouth daily.      Marland Kitchen omeprazole (PRILOSEC) 40 MG capsule TAKE 1 CAPSULE BY MOUTH TWICE DAILY  60 capsule  3  . Polyethylene Glycol 3350 (MIRALAX PO) Take by mouth daily.      . rizatriptan (MAXALT-MLT) 10 MG disintegrating tablet Take 1 tablet (10 mg total) by mouth as needed for migraine. May repeat in 2 hours if needed  15 tablet  6  . topiramate (TOPAMAX) 50 MG tablet Take 50 mg by mouth 2 (two) times daily.      . traMADol-acetaminophen (  ULTRACET) 37.5-325 MG per tablet Take 1 tablet by mouth every 6 (six) hours as needed.  30 tablet  0  . Multiple Vitamins-Minerals (MULTIVITAL) CHEW Chew by mouth daily.       No current facility-administered medications for this visit.      No Known Allergies    Family History  Problem Relation Age of Onset  . Colon cancer Father   . Esophageal cancer Neg Hx   . Rectal cancer Neg Hx   . Stomach cancer Neg Hx   . Heart disease Mother     History   Social History  . Marital Status: Married    Spouse Name: Ann [name]    Number of Children: 3  . Years of Education: 12   Occupational History  . sales    Social History Main Topics  . Smoking status: Never Smoker   . Smokeless tobacco: Never Used  . Alcohol Use: No  . Drug Use: No  . Sexual Activity: No   Other Topics Concern  . None   Social History Narrative   Patient lives at home with her husband 70 years. Ann [name]). Patient has [name] 12th grade education.    Right handed.     Caffeine - two daily.      REVIEW OF SYSTEMS - PERTINENT POSITIVES ONLY: Review of Systems - General ROS: negative for - chills, fever or weight loss Hematological and Lymphatic ROS: negative for - bleeding problems, blood clots or bruising Respiratory ROS: no cough, shortness of breath, or wheezing Cardiovascular ROS: no chest pain or dyspnea on exertion Gastrointestinal ROS: positive for - abdominal pain, blood in stools, constipation, diarrhea and nausea/vomiting negative for - stool incontinence Genito-Urinary ROS: no dysuria, trouble voiding, or hematuria  EXAM: Filed Vitals:   08/02/13 0909  BP: 162/101  Pulse: 74  Temp: 98.6 F (37 C)  Resp: 14    General appearance: alert and cooperative Resp: clear to auscultation bilaterally Cardio: regular rate and rhythm GI: normal findings: soft, non-tender   Procedure: Anoscopy Surgeon: Ann [name] Diagnosis: constipation  Assistant: Ann [name]  After the risks and benefits were explained, verbal consent was obtained for above procedure  Anesthesia: none Findings: good squeeze, good rectal tone, normal push.  No signs of rectal prolapse.  No distal rectocele, grade 2 internal hemorrhoids, non-inflamed    ASSESSMENT AND PLAN:  Ann [name] is [name] 58 y.o. F with constipation.  Her exam was relatively normal.  I did not see evidence of [name] rectocele.  She seems to be describing outlet dysfunction constipation though.  She may have [name] more proximal rectocele.  She would probably benefit from an MR defegram to delineate her problem more and check for internal rectal prolapse.  For now, I have recommended [name] high fiber diet with plenty of water and fiber supplements to build to [name] goal of 25g of fiber per day.  I also think she would benefit from pelvic PT.  I will see if the [facility] pelvic floor physical therapist accepts Medicaid pt's.  I also asked her to undergo [name] colonoscopy to continue her work up for constipation, given her new onset  constipation, rectal bleeding and FH of colon cancer.   I have asked her to continue to work with her PCP regarding her HTN.   Ann Adie, MD Colon and Rectal Surgery / [name].      Visit Diagnoses: 1. Unspecified constipation     Primary Care Physician: Benito Mccreedy, MD

## 2013-08-02 NOTE — Patient Instructions (Signed)
Fiber Chart  You should 25-30g of fiber per day and drinking 8 glasses of water to help your bowels move regularly.  In the chart below you can look up how much fiber you are getting in an average day.  If you are not getting enough fiber, you should add a fiber supplement to your diet.  Examples of this include Metamucil, FiberCon and Citrucel.  These can be purchased at your local grocery store or pharmacy.      [email or web address]-guerrero.[email or web address]   [place]. Irregular bowel habits such as constipation can lead to many problems over time.  Having one soft bowel movement a day is the most important way to prevent further problems.  The anorectal canal is designed to handle stretching and feces to safely manage our ability to get rid of solid waste (feces, poop, stool) out of our body.  BUT, hard constipated stools can act like ripping concrete bricks causing inflamed hemorrhoids, anal fissures, abdominal pain and bloating.     The goal: ONE SOFT BOWEL MOVEMENT A DAY!  To have soft, regular bowel movements:    Drink at least 8 tall glasses of water a day.     Take plenty of fiber.  Fiber is the undigested part of plant food that passes into the colon, acting s "natures broom" to encourage bowel motility and movement.  Fiber can absorb and hold large amounts of water. This results in a larger, bulkier stool, which is soft and easier to pass. Work gradually over several weeks up to 6 servings a day of fiber (25g a day even more if needed) in the form of: o Vegetables -- Root (potatoes, carrots, turnips), leafy green (lettuce, salad greens, celery, spinach), or cooked high residue (cabbage, broccoli, etc) o Fruit -- Fresh (unpeeled skin & pulp), Dried (prunes, apricots, cherries, etc ),  or stewed ( applesauce)  o Whole grain breads, pasta, etc (whole wheat)  o Bran cereals    Bulking Agents -- This type of water-retaining fiber generally is  easily obtained each day by one of the following:  o Psyllium bran -- The psyllium plant is remarkable because its ground seeds can retain so much water. This product is available as Metamucil, Konsyl, Effersyllium, Per Diem Fiber, or the less expensive generic preparation in drug and health food stores. Although labeled a laxative, it really is not a laxative.  o Methylcellulose -- This is another fiber derived from Vannostrand which also retains water. It is available as Citrucel. o Polyethylene Glycol - and "artificial" fiber commonly called Miralax or Glycolax.  It is helpful for people with gassy or bloated feelings with regular fiber o Flax Seed - a less gassy fiber than psyllium   No reading or other relaxing activity while on the toilet. If bowel movements take longer than 5 minutes, you are too constipated   AVOID CONSTIPATION.  High fiber and water intake usually takes care of this.  Sometimes a laxative is needed to stimulate more frequent bowel movements, but    Laxatives are not a good long-term solution as it can wear the colon out. o Osmotics (Milk of Magnesia, Fleets phosphosoda, Magnesium citrate, MiraLax, GoLytely) are safer than  o Stimulants (Senokot, Castor Oil, Dulcolax, Ex Lax)    o Do not take laxatives for more than 7days in a row.    IF SEVERELY CONSTIPATED, try a Bowel Retraining Program: o Do not use laxatives.  o Eat a diet high in roughage, such as  roughage, such as bran cereals and leafy vegetables.  o Drink six (6) ounces of prune or apricot juice each morning.  o Eat two (2) large servings of stewed fruit each day.  o Take one (1) heaping tablespoon of a psyllium-based bulking agent twice a day. Use sugar-free sweetener when possible to avoid excessive calories.  o Eat a normal breakfast.  o Set aside 15 minutes after breakfast to sit on the toilet, but do not strain to have a bowel movement.  o If you do not have a bowel movement by the third day, use an enema and repeat the  above steps.   

## 2013-08-02 NOTE — Progress Notes (Signed)
Subjective:     Ann [name] is a 58 y.o. female here for a routine exam.  Current complaints: Pt had bulge in vaginal area.  Pt had vaginal prolapse surgery in December 2014. Pt was seen earlier this month with no problems. Pt was seen again on 1/12 with bulge present.  Surgery was done by Dr Gaynelle Arabian.  Pt was referred to another office with no treatment given.  Pt doesn't know if she has rectocele or complication from surgery.  Pt has complaints of headaches.  Pt states that her eating habits have changed. Pt states that she has lost a lot of weight since Christmas.  Personal health questionnaire reviewed: yes.   Gynecologic History No LMP recorded. Patient is postmenopausal.   Obstetric History OB History  No data available     The following portions of the patient's history were reviewed and updated as appropriate: allergies, current medications, past family history, past medical history, past social history, past surgical history and problem list.  Review of Systems Pertinent items are noted in HPI.    Objective:    General appearance: alert and no distress Abdomen: normal findings: soft, non-tender Pelvic: external genitalia normal and rectovaginal exam reveals small rectocele.  Speculum exam and bimanual exam not done because patient states that she is not cleared to have instrumentation in vagina yet post op by Dr. Gaynelle Arabian.    Assessment:    S/P anterior repair.  Complains of bulge in rectal area after surgery.  No significant rectocele on exam.   Plan:    Patient to F/U with Dr. Gaynelle Arabian post op.

## 2013-08-02 NOTE — Telephone Encounter (Signed)
Called patient to let her know that she has a apt to see Dr Donna Christen on 08-10-2013 @ 10:00 am. The patient did not want that date she stated that her husband was having surgery that day, and she stated to me on the phone that she had a colonoscopy and she did not need a colonoscopy, I told her that I was doing what Dr Marcello Moores told me to do,because the patient is having rectal bleeding and she has a history of colon cancer. She stated that she will call Dr Olevia Perches office back and R/S herself and she stated that she had the number to her office

## 2013-08-02 NOTE — Telephone Encounter (Signed)
Spoke with Deneise Lever at [facility] and scheduled patient with Dr. Olevia Perches to discuss colonoscopy, constipation and rectal bleeding on 08/10/13 at 10:00 AM

## 2013-08-03 NOTE — Telephone Encounter (Signed)
It is OK for her to have a direct colon in [place]. I reviewed Dr Manon Hilding note.

## 2013-08-04 ENCOUNTER — Encounter: Payer: Self-pay | Admitting: Obstetrics

## 2013-08-04 DIAGNOSIS — N816 Rectocele: Secondary | ICD-10-CM | POA: Insufficient documentation

## 2013-08-04 NOTE — Telephone Encounter (Signed)
Spoke with patient and she will call back to schedule because dates available do not work for her.

## 2013-08-08 ENCOUNTER — Telehealth: Payer: Self-pay | Admitting: *Deleted

## 2013-08-08 NOTE — Telephone Encounter (Signed)
See phone note from 08/02/12.

## 2013-08-08 NOTE — Telephone Encounter (Signed)
Message copied by Hulan Saas on Mon Aug 08, 2013 10:13 AM ------      Message from: [place] Dragon      Created: Sat Aug 06, 2013 10:19 PM      Regarding: urology note       Received Office note from  D.Warden, Urology ANP, clearing pt to proceed with colonoscopy- dated 08/04/2013. ------

## 2013-08-10 ENCOUNTER — Ambulatory Visit: Payer: Medicaid Other | Admitting: Internal Medicine

## 2013-08-18 ENCOUNTER — Ambulatory Visit (INDEPENDENT_AMBULATORY_CARE_PROVIDER_SITE_OTHER): Payer: Medicaid Other | Admitting: General Surgery

## 2013-09-23 ENCOUNTER — Encounter (HOSPITAL_COMMUNITY): Payer: Self-pay | Admitting: Emergency Medicine

## 2013-09-23 ENCOUNTER — Emergency Department (HOSPITAL_COMMUNITY)
Admission: EM | Admit: 2013-09-23 | Discharge: 2013-09-23 | Disposition: A | Discharge status: Home / Self Care | Payer: Medicaid Other | Attending: Emergency Medicine | Admitting: Emergency Medicine

## 2013-09-23 DIAGNOSIS — I10 Essential (primary) hypertension: Secondary | ICD-10-CM | POA: Insufficient documentation

## 2013-09-23 DIAGNOSIS — IMO0002 Reserved for concepts with insufficient information to code with codable children: Secondary | ICD-10-CM | POA: Insufficient documentation

## 2013-09-23 DIAGNOSIS — K219 Gastro-esophageal reflux disease without esophagitis: Secondary | ICD-10-CM | POA: Insufficient documentation

## 2013-09-23 DIAGNOSIS — G4733 Obstructive sleep apnea (adult) (pediatric): Secondary | ICD-10-CM | POA: Insufficient documentation

## 2013-09-23 DIAGNOSIS — Z79899 Other long term (current) drug therapy: Secondary | ICD-10-CM | POA: Insufficient documentation

## 2013-09-23 DIAGNOSIS — Z862 Personal history of diseases of the blood and blood-forming organs and certain disorders involving the immune mechanism: Secondary | ICD-10-CM | POA: Insufficient documentation

## 2013-09-23 DIAGNOSIS — F411 Generalized anxiety disorder: Secondary | ICD-10-CM | POA: Insufficient documentation

## 2013-09-23 DIAGNOSIS — L0291 Cutaneous abscess, unspecified: Secondary | ICD-10-CM

## 2013-09-23 DIAGNOSIS — Z792 Long term (current) use of antibiotics: Secondary | ICD-10-CM | POA: Insufficient documentation

## 2013-09-23 DIAGNOSIS — Z9889 Other specified postprocedural states: Secondary | ICD-10-CM | POA: Insufficient documentation

## 2013-09-23 DIAGNOSIS — Z8742 Personal history of other diseases of the female genital tract: Secondary | ICD-10-CM | POA: Insufficient documentation

## 2013-09-23 DIAGNOSIS — Z8639 Personal history of other endocrine, nutritional and metabolic disease: Secondary | ICD-10-CM | POA: Insufficient documentation

## 2013-09-23 DIAGNOSIS — M129 Arthropathy, unspecified: Secondary | ICD-10-CM | POA: Insufficient documentation

## 2013-09-23 DIAGNOSIS — Z87440 Personal history of urinary (tract) infections: Secondary | ICD-10-CM | POA: Insufficient documentation

## 2013-09-23 DIAGNOSIS — Z7982 Long term (current) use of aspirin: Secondary | ICD-10-CM | POA: Insufficient documentation

## 2013-09-23 DIAGNOSIS — Z8673 Personal history of transient ischemic attack (TIA), and cerebral infarction without residual deficits: Secondary | ICD-10-CM | POA: Insufficient documentation

## 2013-09-23 MED ORDER — SULFAMETHOXAZOLE-TRIMETHOPRIM 800-160 MG PO TABS: 1.0000 | ORAL_TABLET | Freq: Two times a day (BID) | ORAL | Status: DC

## 2013-09-23 NOTE — Discharge Instructions (Signed)

## 2013-09-23 NOTE — ED Notes (Signed)
The pt is c/o boils under her rt arm for one week.  Her doctor told her she has mersa possibly

## 2013-09-23 NOTE — ED Provider Notes (Signed)
CSN: [id number]     Arrival date & time 09/23/13  2055 History  This chart was scribed for non-physician practitioner, Cleatrice Burke, PA-C,working with Merryl Hacker, MD, by Marlowe Kays, ED Scribe.  This patient was seen in room TR09C/TR09C and the patient's care was started at 9:26 PM.  Chief Complaint  Patient presents with  . boil    The history is provided by the patient. No language interpreter was used.   HPI Comments:  Ann [name] is a 58 y.o. obese female with h/o abscesses who presents to the Emergency Department complaining of abscesses under her right arm that started approximately one week ago. She states she experiencing burning pain to the area. She reports some minimal drainage. Pt states she saw her PCP earlier today and was prescribed Keflex and was referred to infectious disease for possible MRSA stating they could not culture the abscess so she elected to not have it drained there. She denies fever.  Past Medical History  Diagnosis Date  . Hypertension   . Arthritis   . GERD (gastroesophageal reflux disease)   . Hyperlipidemia   . History of recurrent UTIs   . H/O hiatal hernia   . History of esophageal dilatation   . History of TIAs NO RESIDUAL    1980;  2005;   2008 PT STATES PER CT  SCARRING RIGHT SIDE OF BRAIN  . Diverticulosis of colon   . SUI (stress urinary incontinence, female)   . Chronic cystitis   . Vaginal vault prolapse   . OSA on CPAP     PER SLEEP STUDY 09-08-2012  MODERATE OSA  . Borderline diabetes   . Nocturia   . Anxiety   . Rectal prolapse    Past Surgical History  Procedure Laterality Date  . Knee arthroscopy Bilateral 2008  . Esophageal dilation  X2  LAST ONE  JUNE 2014  . Tubal ligation  1987  . Anterior and posterior repair N/A 06/06/2013    Procedure: Anterior vaginal vault repair, Sacrospinous ligament fixation with UPHOLD lite, Kelly plication, Sacrospinous mesh fixation, Solyx transurethral sling;  Surgeon: Ailene Rud, MD;  Location: [facility];  Service: Urology;  Laterality: N/A;   Family History  Problem Relation Age of Onset  . Colon cancer Father   . Esophageal cancer Neg Hx   . Rectal cancer Neg Hx   . Stomach cancer Neg Hx   . Heart disease Mother    History  Substance Use Topics  . Smoking status: Never Smoker   . Smokeless tobacco: Never Used  . Alcohol Use: No   OB History   Grav Para Term Preterm Abortions TAB SAB Ect Mult Living                 Review of Systems  Constitutional: Negative for fever.  Skin:       Abscess to right axilla  All other systems reviewed and are negative.   Allergies  Review of patient's allergies indicates no known allergies.  Home Medications   Current Outpatient Rx  Name  Route  Sig  Dispense  Refill  . albuterol (PROVENTIL HFA;VENTOLIN HFA) 108 (90 BASE) MCG/ACT inhaler   Inhalation   Inhale 2 puffs into the lungs every 4 (four) hours as needed for wheezing.   1 Inhaler   2   . amLODipine (NORVASC) 10 MG tablet   Oral   Take 10 mg by mouth 2 (two) times daily.         Marland Kitchen  aspirin EC 81 MG tablet   Oral   Take 81 mg by mouth every evening.         . ciprofloxacin (CIPRO) 500 MG tablet   Oral   Take 1 tablet (500 mg total) by mouth 2 (two) times daily.   10 tablet   0   . clonazePAM (KLONOPIN) 0.5 MG tablet   Oral   Take 0.5 mg by mouth 3 (three) times daily as needed for anxiety.         Marland Kitchen lisinopril (PRINIVIL,ZESTRIL) 20 MG tablet   Oral   Take 20 mg by mouth daily.         . Multiple Vitamins-Minerals (MULTIVITAL) CHEW   Oral   Chew by mouth daily.         . nortriptyline (PAMELOR) 50 MG capsule   Oral   Take 50 mg by mouth at bedtime.         Marland Kitchen omeprazole (PRILOSEC) 40 MG capsule      TAKE 1 CAPSULE BY MOUTH TWICE DAILY   60 capsule   3   . Polyethylene Glycol 3350 (MIRALAX PO)   Oral   Take by mouth daily.         . rizatriptan (MAXALT-MLT) 10 MG disintegrating  tablet   Oral   Take 1 tablet (10 mg total) by mouth as needed for migraine. May repeat in 2 hours if needed   15 tablet   6   . topiramate (TOPAMAX) 50 MG tablet   Oral   Take 50 mg by mouth 2 (two) times daily.         . traMADol-acetaminophen (ULTRACET) 37.5-325 MG per tablet   Oral   Take 1 tablet by mouth every 6 (six) hours as needed.   30 tablet   0    Triage Vitals: BP 160/94  Pulse 62  Temp(Src) 98.6 F (37 C)  Resp 20  Ht 5\' 3"  (1.6 m)  Wt 217 lb (98.431 kg)  BMI 38.45 kg/m2  SpO2 97% Physical Exam  Nursing note and vitals reviewed. Constitutional: She is oriented to person, place, and time. She appears well-developed and well-nourished. No distress.  HENT:  Head: Normocephalic and atraumatic.  Right Ear: External ear normal.  Left Ear: External ear normal.  Nose: Nose normal.  Mouth/Throat: Oropharynx is clear and moist.  Eyes: Conjunctivae are normal.  Neck: Normal range of motion.  Cardiovascular: Normal rate, regular rhythm and normal heart sounds.   Pulmonary/Chest: Effort normal and breath sounds normal. No stridor. No respiratory distress. She has no wheezes. She has no rales.  Abdominal: Soft. She exhibits no distension.  Musculoskeletal: Normal range of motion.  Neurological: She is alert and oriented to person, place, and time. She has normal strength.  Skin: Skin is warm and dry. She is not diaphoretic. There is erythema.  4 separate areas of induration. 2 cm fluctuant area with smaller 1 cm areas of induration below.  Psychiatric: She has a normal mood and affect. Her behavior is normal.    ED Course  Procedures (including critical care time) DIAGNOSTIC STUDIES: Oxygen Saturation is 97% on RA, normal by my interpretation.   COORDINATION OF CARE: 9:33 PM- Will I & D abscess. Will prescribe Septra. Pt verbalizes understanding and agrees to plan.  INCISION AND DRAINAGE PROCEDURE NOTE: Patient identification was confirmed and verbal consent  was obtained. This procedure was performed by Cleatrice Burke, PA-C at 9:42 PM. Site: right axilla Sterile procedures observed Anesthetic used (  type and amt): Lidocaine 2% with Epinephrine (2 mL) Blade size: #11 Drainage: copious Complexity: Complex Site anesthetized, incision made over site, wound drained and explored loculations, rinsed with copious amounts of normal saline, wound packed with sterile gauze, covered with dry, sterile dressing.  Pt tolerated procedure well without complications.  Instructions for care discussed verbally and pt provided with additional written instructions for homecare and f/u.   Medications - No data to display  Labs Review Labs Reviewed  WOUND CULTURE   Imaging Review No results found.   EKG Interpretation None      MDM   Final diagnoses:  Abscess    Patient presents to ED for abscess. She was seen by PCP earlier today and was given Keflex. They told her they would not culture the wound and would send her to ID for culture. Abscess was drained and culture was sent here. There was a copious amount of purulent drainage from abscess. Bactrim was added to abx regimen. Encouraged PCP follow up. Discussed reasons to return to ED immediately. Vital signs stable for discharge. Patient / Family / Caregiver informed of clinical course, understand medical decision-making process, and agree with plan.   I personally performed the services described in this documentation, which was scribed in my presence. The recorded information has been reviewed and is accurate.    Elwyn Lade, PA-C 09/23/13 [phone]

## 2013-09-24 NOTE — ED Provider Notes (Signed)
Medical screening examination/treatment/procedure(s) were performed by non-physician practitioner and as supervising physician I was immediately available for consultation/collaboration.   EKG Interpretation None        Merryl Hacker, MD 09/24/13 0110

## 2013-09-26 ENCOUNTER — Telehealth (HOSPITAL_COMMUNITY): Payer: Self-pay

## 2013-09-26 LAB — WOUND CULTURE

## 2013-09-27 ENCOUNTER — Encounter: Payer: Self-pay | Admitting: Internal Medicine

## 2013-09-27 ENCOUNTER — Ambulatory Visit (INDEPENDENT_AMBULATORY_CARE_PROVIDER_SITE_OTHER): Payer: Medicaid Other | Admitting: Internal Medicine

## 2013-09-27 VITALS — BP 159/92 | HR 78 | Temp 97.9°F | Wt 229.0 lb

## 2013-09-27 DIAGNOSIS — B20 Human immunodeficiency virus [HIV] disease: Secondary | ICD-10-CM

## 2013-09-27 DIAGNOSIS — R110 Nausea: Secondary | ICD-10-CM

## 2013-09-27 DIAGNOSIS — A4902 Methicillin resistant Staphylococcus aureus infection, unspecified site: Secondary | ICD-10-CM

## 2013-09-27 MED ORDER — CHLORHEXIDINE GLUCONATE 4 % EX LIQD: Freq: Every day | CUTANEOUS | Status: DC | PRN

## 2013-09-27 MED ORDER — MUPIROCIN 2 % EX OINT: 1.0000 "application " | TOPICAL_OINTMENT | Freq: Two times a day (BID) | CUTANEOUS | Status: DC

## 2013-09-27 MED ORDER — PROMETHAZINE HCL 12.5 MG PO TABS: 12.5000 mg | ORAL_TABLET | Freq: Three times a day (TID) | ORAL | Status: DC | PRN

## 2013-09-28 NOTE — Progress Notes (Signed)
Subjective:    Patient ID: Ann [name], female    DOB: Nov 03, 1955, 58 y.o.   MRN: [id number]  HPI 58yo F with multiple medical problems, who started to notice having small pustules on right axillary region. She was seen by her PCP who prescribed keflex on 3/20. At that time, furuncles were not amenable to lancing. She went to the ED later that day in order to get I x D, where at that time multiple lesions 4 cm induration but a [name] 2 cm lesions that were fluctuant. Cultures sent for cutlure but also given bactrim for presumed MRSA. Her cultures did grow out MRSA ( clinda S, vanco S, tetra S, and bactrim S). She states that the area is no longer draining. Improved, less pain. Taking antibiotics but causes some nausea. No fever, chills, nightsweats. She is worried in giving these lesions to her family, husband, young newborn grandkids. She had vaginal prolapse surgery last December but started to see scatttered skin lesions/furuncles since then. No Known Allergies    Current Outpatient Prescriptions on File Prior to Visit  Medication Sig Dispense Refill  . sulfamethoxazole-trimethoprim (SEPTRA DS) 800-160 MG per tablet Take 1 tablet by mouth every 12 (twelve) hours.  20 tablet  0  . albuterol (PROVENTIL HFA;VENTOLIN HFA) 108 (90 BASE) MCG/ACT inhaler Inhale 2 puffs into the lungs every 4 (four) hours as needed for wheezing.  1 Inhaler  2  . amLODipine (NORVASC) 10 MG tablet Take 10 mg by mouth 2 (two) times daily.      Marland Kitchen aspirin EC 81 MG tablet Take 81 mg by mouth every evening.      . ciprofloxacin (CIPRO) 500 MG tablet Take 500 mg by mouth 2 (two) times daily. Completed course of medication back in February      . clonazePAM (KLONOPIN) 0.5 MG tablet Take 0.5 mg by mouth 3 (three) times daily as needed for anxiety.      Marland Kitchen lisinopril (PRINIVIL,ZESTRIL) 20 MG tablet Take 20 mg by mouth at bedtime.       . Multiple Vitamins-Minerals (MULTIVITAL) CHEW Chew 1 tablet by mouth daily.       .  nortriptyline (PAMELOR) 50 MG capsule Take 50 mg by mouth at bedtime.      Marland Kitchen omeprazole (PRILOSEC) 40 MG capsule TAKE 1 CAPSULE BY MOUTH TWICE DAILY  60 capsule  3  . Polyethylene Glycol 3350 (MIRALAX PO) Take 1 packet by mouth daily.       . rizatriptan (MAXALT-MLT) 10 MG disintegrating tablet Take 1 tablet (10 mg total) by mouth as needed for migraine. May repeat in 2 hours if needed  15 tablet  6  . topiramate (TOPAMAX) 50 MG tablet Take 50 mg by mouth 2 (two) times daily.       No current facility-administered medications on file prior to visit.   Active Ambulatory Problems    Diagnosis Date Noted  . Hypertension   . Cough 07/27/2012  . OSA (obstructive sleep apnea)   . Unspecified nutritional deficiency   . Polyneuropathy in other diseases classified elsewhere   . Anosmia 01/25/2013  . Cystocele 06/06/2013  . Respiratory failure with hypoxia 06/07/2013  . Respiratory failure, acute-on-chronic 06/07/2013  . Unspecified constipation 07/23/2013  . 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 CAP (community acquired pneumonia) 07/27/2012   Past Medical History  Diagnosis Date  . Arthritis   . GERD (gastroesophageal reflux disease)   .  Hyperlipidemia   . History of recurrent UTIs   . H/O hiatal hernia   . History of esophageal dilatation   . History of TIAs NO RESIDUAL  . Diverticulosis of colon   . SUI (stress urinary incontinence, female)   . Chronic cystitis   . Vaginal vault prolapse   . OSA on CPAP   . Borderline diabetes   . Nocturia   . Anxiety   . Rectal prolapse    History  Substance Use Topics  . Smoking status: Never Smoker   . Smokeless tobacco: Never Used  . Alcohol Use: No  family history includes Colon cancer in her father; Heart disease in her mother. There is no history of Esophageal cancer, Rectal cancer, or Stomach cancer.   Review of Systems  Constitutional: Negative for fever,  chills, diaphoresis, activity change, appetite change, fatigue and unexpected weight change.  HENT: Negative for congestion, sore throat, rhinorrhea, sneezing, trouble swallowing and sinus pressure.  Eyes: Negative for photophobia and visual disturbance.  Respiratory: Negative for cough, chest tightness, shortness of breath, wheezing and stridor.  Cardiovascular: Negative for chest pain, palpitations and leg swelling.  Gastrointestinal: Negative for nausea, vomiting, abdominal pain, diarrhea, constipation, blood in stool, abdominal distention and anal bleeding.  Genitourinary: Negative for dysuria, hematuria, flank pain and difficulty urinating.  Musculoskeletal: Negative for myalgias, back pain, joint swelling, arthralgias and gait problem.  Skin: Negative for color change, pallor, rash and wound.  Neurological: Negative for dizziness, tremors, weakness and light-headedness.  Hematological: Negative for adenopathy. Does not bruise/bleed easily.  Psychiatric/Behavioral: Negative for behavioral problems, confusion, sleep disturbance, dysphoric mood, decreased concentration and agitation.       Objective:   Physical Exam BP 159/92  Pulse 78  Temp(Src) 97.9 F (36.6 C) (Oral)  Wt 229 lb (103.874 kg) gen = a xo by 3 in NAD Skin = several small ( 4 lesions) incisions that are now healed, small amount of induration. Has multiple skin tears from tape.      Assessment & Plan:  MRSA SSTI  Of axilla= continue with keflex for a total of 10 days. Will also ask her to do mupirocin BID in nares and daily hibiclens wash for decolonization since it appears this is not her first episode per her history.  Nausea associated with bactrim = gave rx for phenergan to help tolerate her antibiotics  rtc prn

## 2014-01-25 ENCOUNTER — Other Ambulatory Visit: Payer: Self-pay | Admitting: Urology

## 2014-02-07 ENCOUNTER — Telehealth: Payer: Self-pay | Admitting: Internal Medicine

## 2014-02-07 ENCOUNTER — Telehealth: Payer: Self-pay

## 2014-02-07 NOTE — Telephone Encounter (Signed)
Patient was told that she had HIV from her primary care doctor based upon a note that came from our clinic in March 2015. The 042-HIV diagnosis was mistakenly associated to her visit. She was seen on the visit for MRSA soft skin infection of the axilla. She was not tested for HIV during this visit.  She was quite upset hearing this news, pcp contacted our office. I have called back to let her know that this was a mistake on my behalf, and unclear how it was associated to visit, since we never did any testing for it. never  She accepted my apology. We then spoke about the stressors in her life, she is still quite upset about her abdominal/pelvic surgery for vaginal-bladder rectocele repair. She cares for her aging husband, her pregnant daughter has pre-eclampsia. She reports not having many people to help cope with stress. She states that she is relieved not to have HIV since she would want it "to be over". After I re-asurred her that we never tested her for HIV and that it is not documented on her problem list, she accepted this information and was no longer anxious as reported by my staff. I offered to place her in contact with counseling at [facility] but she stated that she did not need it  She has upcoming surgery on August 17th, I mentioned that she would benefit from MRSA decolonization given her history of MRSA skin infection. She was already prescribed 5 days of mupirocin and hibiclens. She reports unable to afford hibiclens. Will give her a call tonight to ensure she is doing ok from the emotional standpoint and see if she can get chlorhexedine wipes/solution cheaper at outpatient pharmacy  I also spoke to her PCP, Dr .Chaney Born, who mentioned that he thought it was an error as well but did not look up in EPIC if there were any test results to confirm or reject diagnosis. I mentioned to him the error that I think was based upon template that I was using and that I will see what we can  do to remove diagnosis. I also mentioned that I had spoken with Ms. Woods and that she is no longer upset. Unclear if she had mentioned suicide ideation to him or if it was only mentioned to my clinic staff. Will place safety zone.

## 2014-02-07 NOTE — Telephone Encounter (Signed)
Patient called the office cying stating she was told by primary care office she was HIV positive.   She says she is suicidal and needs to be dead before her husband arrives. She is very upset and states the physician insisted was HIV positive and did not tell him.  Patient states his office was tried to contact Dr Baxter Flattery without success.  I reviewed the date of the patient's visit with Dr Baxter Flattery and no labs test were completed on that day.  I reviewed the labs in the medical record from throughout the medical records and there was no evidence of an HIV test being performed.  Pt was informed there was not labs performed in our electronic system to indicate HIV.  I then contacted Dr. Baxter Flattery via phone who in turn contacted the patient.  I also called  Dr    To inform him the diagnosis has not been confimed and no labwork has been drawn or was drawn during the visit.

## 2014-02-08 ENCOUNTER — Other Ambulatory Visit: Payer: Self-pay | Admitting: Internal Medicine

## 2014-02-08 DIAGNOSIS — A4902 Methicillin resistant Staphylococcus aureus infection, unspecified site: Secondary | ICD-10-CM

## 2014-02-08 MED ORDER — CHLORHEXIDINE GLUCONATE 4 % EX LIQD: Freq: Every day | CUTANEOUS | Status: DC

## 2014-02-08 MED ORDER — MUPIROCIN 2 % EX OINT: 1.0000 "application " | TOPICAL_OINTMENT | Freq: Three times a day (TID) | CUTANEOUS | Status: DC

## 2014-02-08 NOTE — Progress Notes (Signed)
Spoke to patient this morning, she is doing better, no thoughts of harming herself. She realizes that she does not have mupirocin left at home. Will prescribe mupirocin and hibiclens to use 5 days prior to upcoming surgery as part of decolonization and minimizing risk of SSI.

## 2014-02-09 ENCOUNTER — Encounter (HOSPITAL_COMMUNITY): Payer: Self-pay | Admitting: Pharmacy Technician

## 2014-02-13 NOTE — Patient Instructions (Signed)
Ann [name]  02/13/2014   Your procedure is scheduled on:  02/20/2014  Report to [facility].  Follow the Signs to [place] at   0700     am  Call this number if you have problems the morning of surgery: [phone]   Remember:   Do not eat food or drink liquids after midnight.   Take these medicines the morning of surgery with A SIP OF WATER:    Do not wear jewelry, make-up or nail polish.  Do not wear lotions, powders, or perfumes. , deodorant    Do not shave 48 hours prior to surgery.   Do not bring valuables to the hospital.  Contacts, dentures or bridgework may not be worn into surgery.  Leave suitcase in the car. After surgery it may be brought to your room.  For patients admitted to the hospital, checkout time is 11:00 AM the day of  discharge.       Please read over the following fact sheets that you were given: [facility] - Preparing for Surgery Before surgery, you can play an important role.  Because skin is not sterile, your skin needs to be as free of germs as possible.  You can reduce the number of germs on your skin by washing with CHG (chlorahexidine gluconate) soap before surgery.  CHG is an antiseptic cleaner which kills germs and bonds with the skin to continue killing germs even after washing. Please DO NOT use if you have an allergy to CHG or antibacterial soaps.  If your skin becomes reddened/irritated stop using the CHG and inform your nurse when you arrive at Short Stay. Do not shave (including legs and underarms) for at least 48 hours prior to the first CHG shower.  You may shave your face/neck. Please follow these instructions carefully:  1.  Shower with CHG Soap the night before surgery and the  morning of Surgery.  2.  If you choose to wash your hair, wash your hair first as usual with your  normal  shampoo.  3.  After you shampoo, rinse your hair and body thoroughly to remove the  shampoo.                           4.  Use CHG as you  would any other liquid soap.  You can apply chg directly  to the skin and wash                       Gently with a scrungie or clean washcloth.  5.  Apply the CHG Soap to your body ONLY FROM THE NECK DOWN.   Do not use on face/ open                           Wound or open sores. Avoid contact with eyes, ears mouth and genitals (private parts).                       Wash face,  Genitals (private parts) with your normal soap.             6.  Wash thoroughly, paying special attention to the area where your surgery  will be performed.  7.  Thoroughly rinse your body with warm water from the neck down.  8.  DO NOT shower/wash with your normal soap after using  and rinsing off  the CHG Soap.                9.  Pat yourself dry with a clean towel.            10.  Wear clean pajamas.            11.  Place clean sheets on your bed the night of your first shower and do not  sleep with pets. Day of Surgery : Do not apply any lotions/deodorants the morning of surgery.  Please wear clean clothes to the hospital/surgery center.  FAILURE TO FOLLOW THESE INSTRUCTIONS MAY RESULT IN THE CANCELLATION OF YOUR SURGERY PATIENT SIGNATURE_________________________________  NURSE SIGNATURE__________________________________  ________________________________________________________________________ , coughing and deep breathing exercises, leg exercises

## 2014-02-14 ENCOUNTER — Encounter (HOSPITAL_COMMUNITY)
Admission: RE | Admit: 2014-02-14 | Discharge: 2014-02-14 | Disposition: A | Discharge status: Home / Self Care | Payer: Medicaid Other | Source: Ambulatory Visit | Attending: Urology | Admitting: Urology

## 2014-02-14 ENCOUNTER — Encounter (HOSPITAL_COMMUNITY): Payer: Self-pay

## 2014-02-14 DIAGNOSIS — Z01818 Encounter for other preprocedural examination: Secondary | ICD-10-CM | POA: Diagnosis not present

## 2014-02-14 DIAGNOSIS — Z01812 Encounter for preprocedural laboratory examination: Secondary | ICD-10-CM | POA: Diagnosis not present

## 2014-02-14 HISTORY — DX: Polyneuropathy, unspecified: G62.9

## 2014-02-14 HISTORY — DX: Anemia, unspecified: D64.9

## 2014-02-14 HISTORY — DX: Headache: R51

## 2014-02-14 LAB — BASIC METABOLIC PANEL
Anion gap: 10 (ref 5–15)
BUN: 15 mg/dL (ref 6–23)
CO2: 28 mEq/L (ref 19–32)
Calcium: 9.7 mg/dL (ref 8.4–10.5)
Chloride: 107 mEq/L (ref 96–112)
Creatinine, Ser: 1.17 mg/dL — ABNORMAL HIGH (ref 0.50–1.10)
GFR, EST AFRICAN AMERICAN: 60 mL/min — AB (ref >90)
GFR, EST NON AFRICAN AMERICAN: 51 mL/min — AB (ref >90)
Glucose, Bld: 112 mg/dL — ABNORMAL HIGH (ref 70–99)
POTASSIUM: 4.8 meq/L (ref 3.7–5.3)
SODIUM: 145 meq/L (ref 137–147)

## 2014-02-14 LAB — CBC
HCT: 38 % (ref 36.0–46.0)
Hemoglobin: 12.2 g/dL (ref 12.0–15.0)
MCH: 30.4 pg (ref 26.0–34.0)
MCHC: 32.1 g/dL (ref 30.0–36.0)
MCV: 94.8 fL (ref 78.0–100.0)
PLATELETS: 343 10*3/uL (ref 150–400)
RBC: 4.01 MIL/uL (ref 3.87–5.11)
RDW: 13.7 % (ref 11.5–15.5)
WBC: 5.4 10*3/uL (ref 4.0–10.5)

## 2014-02-14 LAB — SURGICAL PCR SCREEN
MRSA, PCR: NEGATIVE
STAPHYLOCOCCUS AUREUS: NEGATIVE

## 2014-02-14 NOTE — Progress Notes (Signed)
BMP results faxed via EPIC to Dr Gaynelle Arabian.

## 2014-02-14 NOTE — Progress Notes (Signed)
Last office visit with Dr Vista Lawman 12/16/13 on chart  Sleep Study 10/14/12 EPIC  EKG- 12/01/13- chart  ECHO- 12/23/13- chart  Chest CT- 06/07/2013

## 2014-02-14 NOTE — Progress Notes (Signed)
Patient reports " respiratory failure after every surgery. "  Also at time of preop appointment- Patient states does not want local at site of IV insertion .  Was used for surgery in 06/2013 and patient states she" had a spike in her blood pressure and felt faint.  "

## 2014-02-14 NOTE — Progress Notes (Signed)
Patient called and made aware PCR swab results were negative.

## 2014-02-15 ENCOUNTER — Encounter (HOSPITAL_COMMUNITY): Payer: Self-pay

## 2014-02-19 MED ORDER — SULFAMETHOXAZOLE-TRIMETHOPRIM 400-80 MG/5ML IV SOLN
300.0000 mg | INTRAVENOUS | Status: AC
  Administered 2014-02-20: 300 mg via INTRAVENOUS
  Filled 2014-02-19: qty 18.8

## 2014-02-20 ENCOUNTER — Inpatient Hospital Stay (HOSPITAL_COMMUNITY)
Admission: RE | Admit: 2014-02-20 | Discharge: 2014-02-22 | DRG: 746 | Disposition: A | Discharge status: Home / Self Care | Payer: Medicaid Other | Source: Ambulatory Visit | Attending: Urology | Admitting: Urology

## 2014-02-20 ENCOUNTER — Encounter (HOSPITAL_COMMUNITY)
Admission: RE | Disposition: A | Discharge status: Home / Self Care | Payer: Self-pay | Source: Ambulatory Visit | Attending: Urology

## 2014-02-20 ENCOUNTER — Encounter (HOSPITAL_COMMUNITY): Payer: Medicaid Other | Admitting: Certified Registered Nurse Anesthetist

## 2014-02-20 ENCOUNTER — Ambulatory Visit (HOSPITAL_COMMUNITY): Payer: Medicaid Other | Admitting: Certified Registered Nurse Anesthetist

## 2014-02-20 ENCOUNTER — Observation Stay (HOSPITAL_COMMUNITY): Payer: Medicaid Other

## 2014-02-20 ENCOUNTER — Encounter (HOSPITAL_COMMUNITY): Payer: Self-pay | Admitting: Certified Registered Nurse Anesthetist

## 2014-02-20 DIAGNOSIS — K219 Gastro-esophageal reflux disease without esophagitis: Secondary | ICD-10-CM | POA: Diagnosis present

## 2014-02-20 DIAGNOSIS — K5900 Constipation, unspecified: Secondary | ICD-10-CM | POA: Diagnosis present

## 2014-02-20 DIAGNOSIS — E559 Vitamin D deficiency, unspecified: Secondary | ICD-10-CM | POA: Diagnosis present

## 2014-02-20 DIAGNOSIS — N3020 Other chronic cystitis without hematuria: Secondary | ICD-10-CM | POA: Diagnosis present

## 2014-02-20 DIAGNOSIS — N816 Rectocele: Principal | ICD-10-CM | POA: Diagnosis present

## 2014-02-20 DIAGNOSIS — J690 Pneumonitis due to inhalation of food and vomit: Secondary | ICD-10-CM

## 2014-02-20 DIAGNOSIS — F411 Generalized anxiety disorder: Secondary | ICD-10-CM | POA: Diagnosis present

## 2014-02-20 DIAGNOSIS — G4700 Insomnia, unspecified: Secondary | ICD-10-CM | POA: Diagnosis present

## 2014-02-20 DIAGNOSIS — Z8249 Family history of ischemic heart disease and other diseases of the circulatory system: Secondary | ICD-10-CM

## 2014-02-20 DIAGNOSIS — R000 Tachycardia, unspecified: Secondary | ICD-10-CM | POA: Diagnosis not present

## 2014-02-20 DIAGNOSIS — Z800 Family history of malignant neoplasm of digestive organs: Secondary | ICD-10-CM

## 2014-02-20 DIAGNOSIS — Z87440 Personal history of urinary (tract) infections: Secondary | ICD-10-CM

## 2014-02-20 DIAGNOSIS — IMO0002 Reserved for concepts with insufficient information to code with codable children: Secondary | ICD-10-CM

## 2014-02-20 DIAGNOSIS — I10 Essential (primary) hypertension: Secondary | ICD-10-CM | POA: Diagnosis present

## 2014-02-20 DIAGNOSIS — Z9851 Tubal ligation status: Secondary | ICD-10-CM

## 2014-02-20 DIAGNOSIS — M129 Arthropathy, unspecified: Secondary | ICD-10-CM | POA: Diagnosis present

## 2014-02-20 DIAGNOSIS — R059 Cough, unspecified: Secondary | ICD-10-CM

## 2014-02-20 DIAGNOSIS — Z6839 Body mass index (BMI) 39.0-39.9, adult: Secondary | ICD-10-CM

## 2014-02-20 DIAGNOSIS — Z8614 Personal history of Methicillin resistant Staphylococcus aureus infection: Secondary | ICD-10-CM

## 2014-02-20 DIAGNOSIS — IMO0001 Reserved for inherently not codable concepts without codable children: Secondary | ICD-10-CM

## 2014-02-20 DIAGNOSIS — D72829 Elevated white blood cell count, unspecified: Secondary | ICD-10-CM | POA: Diagnosis not present

## 2014-02-20 DIAGNOSIS — Z8673 Personal history of transient ischemic attack (TIA), and cerebral infarction without residual deficits: Secondary | ICD-10-CM

## 2014-02-20 DIAGNOSIS — G4733 Obstructive sleep apnea (adult) (pediatric): Secondary | ICD-10-CM | POA: Diagnosis present

## 2014-02-20 DIAGNOSIS — I519 Heart disease, unspecified: Secondary | ICD-10-CM | POA: Diagnosis not present

## 2014-02-20 DIAGNOSIS — Z831 Family history of other infectious and parasitic diseases: Secondary | ICD-10-CM

## 2014-02-20 DIAGNOSIS — J9600 Acute respiratory failure, unspecified whether with hypoxia or hypercapnia: Secondary | ICD-10-CM | POA: Diagnosis not present

## 2014-02-20 DIAGNOSIS — Z6841 Body Mass Index (BMI) 40.0 and over, adult: Secondary | ICD-10-CM

## 2014-02-20 DIAGNOSIS — E785 Hyperlipidemia, unspecified: Secondary | ICD-10-CM | POA: Diagnosis present

## 2014-02-20 DIAGNOSIS — E662 Morbid (severe) obesity with alveolar hypoventilation: Secondary | ICD-10-CM | POA: Diagnosis present

## 2014-02-20 DIAGNOSIS — R05 Cough: Secondary | ICD-10-CM

## 2014-02-20 DIAGNOSIS — J9601 Acute respiratory failure with hypoxia: Secondary | ICD-10-CM

## 2014-02-20 DIAGNOSIS — Y838 Other surgical procedures as the cause of abnormal reaction of the patient, or of later complication, without mention of misadventure at the time of the procedure: Secondary | ICD-10-CM | POA: Diagnosis not present

## 2014-02-20 HISTORY — PX: RECTOCELE REPAIR: SHX761

## 2014-02-20 LAB — COMPREHENSIVE METABOLIC PANEL
ALT: 13 U/L (ref 0–35)
AST: 18 U/L (ref 0–37)
Albumin: 3.6 g/dL (ref 3.5–5.2)
Alkaline Phosphatase: 77 U/L (ref 39–117)
Anion gap: 11 (ref 5–15)
BUN: 15 mg/dL (ref 6–23)
CO2: 25 meq/L (ref 19–32)
Calcium: 8.9 mg/dL (ref 8.4–10.5)
Chloride: 103 mEq/L (ref 96–112)
Creatinine, Ser: 1.14 mg/dL — ABNORMAL HIGH (ref 0.50–1.10)
GFR calc Af Amer: 62 mL/min — ABNORMAL LOW (ref >90)
GFR, EST NON AFRICAN AMERICAN: 53 mL/min — AB (ref >90)
Glucose, Bld: 166 mg/dL — ABNORMAL HIGH (ref 70–99)
Potassium: 4.5 mEq/L (ref 3.7–5.3)
SODIUM: 139 meq/L (ref 137–147)
Total Bilirubin: 0.3 mg/dL (ref 0.3–1.2)
Total Protein: 7.7 g/dL (ref 6.0–8.3)

## 2014-02-20 LAB — CBC
HCT: 38.2 % (ref 36.0–46.0)
HEMOGLOBIN: 12.5 g/dL (ref 12.0–15.0)
MCH: 31.3 pg (ref 26.0–34.0)
MCHC: 32.7 g/dL (ref 30.0–36.0)
MCV: 95.7 fL (ref 78.0–100.0)
PLATELETS: 334 10*3/uL (ref 150–400)
RBC: 3.99 MIL/uL (ref 3.87–5.11)
RDW: 13.6 % (ref 11.5–15.5)
WBC: 15.9 10*3/uL — ABNORMAL HIGH (ref 4.0–10.5)

## 2014-02-20 LAB — TROPONIN I

## 2014-02-20 SURGERY — COLPORRHAPHY, POSTERIOR, FOR RECTOCELE REPAIR
Anesthesia: General

## 2014-02-20 MED ORDER — SULFAMETHOXAZOLE-TMP DS 800-160 MG PO TABS
1.0000 | ORAL_TABLET | Freq: Two times a day (BID) | ORAL | Status: DC
  Administered 2014-02-20 – 2014-02-21 (×3): 1 via ORAL
  Filled 2014-02-20 (×5): qty 1

## 2014-02-20 MED ORDER — ZOLPIDEM TARTRATE 5 MG PO TABS: 5.0000 mg | ORAL_TABLET | Freq: Every evening | ORAL | Status: DC | PRN

## 2014-02-20 MED ORDER — HYDROMORPHONE HCL PF 1 MG/ML IJ SOLN
0.5000 mg | INTRAMUSCULAR | Status: DC | PRN
  Administered 2014-02-20 – 2014-02-21 (×4): 1 mg via INTRAVENOUS
  Filled 2014-02-20 (×4): qty 1

## 2014-02-20 MED ORDER — LIDOCAINE HCL (CARDIAC) 20 MG/ML IV SOLN
INTRAVENOUS | Status: AC
  Filled 2014-02-20: qty 5

## 2014-02-20 MED ORDER — ACETAMINOPHEN 325 MG PO TABS: 650.0000 mg | ORAL_TABLET | ORAL | Status: DC | PRN

## 2014-02-20 MED ORDER — ACETAMINOPHEN 10 MG/ML IV SOLN
1000.0000 mg | Freq: Once | INTRAVENOUS | Status: AC
  Administered 2014-02-20: 1000 mg via INTRAVENOUS
  Filled 2014-02-20: qty 100

## 2014-02-20 MED ORDER — ONDANSETRON HCL 4 MG/2ML IJ SOLN
4.0000 mg | INTRAMUSCULAR | Status: DC | PRN
  Administered 2014-02-21: 4 mg via INTRAVENOUS
  Filled 2014-02-20: qty 2

## 2014-02-20 MED ORDER — ALBUTEROL SULFATE (2.5 MG/3ML) 0.083% IN NEBU: 3.0000 mL | INHALATION_SOLUTION | RESPIRATORY_TRACT | Status: DC | PRN

## 2014-02-20 MED ORDER — EPHEDRINE SULFATE 50 MG/ML IJ SOLN
INTRAMUSCULAR | Status: AC
  Filled 2014-02-20: qty 1

## 2014-02-20 MED ORDER — POLYMYXIN B SULFATE 500000 UNITS IJ SOLR
INTRAMUSCULAR | Status: DC | PRN
  Administered 2014-02-20: 11:00:00

## 2014-02-20 MED ORDER — HYOSCYAMINE SULFATE 0.125 MG SL SUBL
0.1250 mg | SUBLINGUAL_TABLET | Freq: Four times a day (QID) | SUBLINGUAL | Status: DC | PRN
  Administered 2014-02-21 – 2014-02-22 (×2): 0.125 mg via ORAL
  Filled 2014-02-20 (×3): qty 1

## 2014-02-20 MED ORDER — FENTANYL CITRATE 0.05 MG/ML IJ SOLN
INTRAMUSCULAR | Status: DC | PRN
  Administered 2014-02-20: 50 ug via INTRAVENOUS
  Administered 2014-02-20: 25 ug via INTRAVENOUS
  Administered 2014-02-20: 50 ug via INTRAVENOUS
  Administered 2014-02-20: 25 ug via INTRAVENOUS
  Administered 2014-02-20: 50 ug via INTRAVENOUS

## 2014-02-20 MED ORDER — FENTANYL CITRATE 0.05 MG/ML IJ SOLN
25.0000 ug | INTRAMUSCULAR | Status: DC | PRN
  Administered 2014-02-20 (×3): 25 ug via INTRAVENOUS

## 2014-02-20 MED ORDER — AMLODIPINE BESYLATE 10 MG PO TABS
10.0000 mg | ORAL_TABLET | Freq: Two times a day (BID) | ORAL | Status: DC
  Administered 2014-02-20 – 2014-02-21 (×3): 10 mg via ORAL
  Filled 2014-02-20 (×5): qty 1

## 2014-02-20 MED ORDER — DIPHENHYDRAMINE HCL 12.5 MG/5ML PO ELIX: 12.5000 mg | ORAL_SOLUTION | Freq: Four times a day (QID) | ORAL | Status: DC | PRN

## 2014-02-20 MED ORDER — SUCCINYLCHOLINE CHLORIDE 20 MG/ML IJ SOLN
INTRAMUSCULAR | Status: DC | PRN
  Administered 2014-02-20: 120 mg via INTRAVENOUS

## 2014-02-20 MED ORDER — MIDAZOLAM HCL 5 MG/5ML IJ SOLN
INTRAMUSCULAR | Status: DC | PRN
  Administered 2014-02-20 (×2): 1 mg via INTRAVENOUS

## 2014-02-20 MED ORDER — PROMETHAZINE HCL 25 MG/ML IJ SOLN: 6.2500 mg | INTRAMUSCULAR | Status: DC | PRN

## 2014-02-20 MED ORDER — BUPIVACAINE-EPINEPHRINE 0.25% -1:200000 IJ SOLN
INTRAMUSCULAR | Status: AC
  Filled 2014-02-20: qty 1

## 2014-02-20 MED ORDER — BACITRACIN-NEOMYCIN-POLYMYXIN 400-5-5000 EX OINT: 1.0000 "application " | TOPICAL_OINTMENT | Freq: Three times a day (TID) | CUTANEOUS | Status: DC | PRN

## 2014-02-20 MED ORDER — SODIUM CHLORIDE 0.9 % IV SOLN
INTRAVENOUS | Status: DC
  Administered 2014-02-20: 40 mL/h via INTRAVENOUS

## 2014-02-20 MED ORDER — ONDANSETRON HCL 4 MG/2ML IJ SOLN
INTRAMUSCULAR | Status: AC
  Filled 2014-02-20: qty 2

## 2014-02-20 MED ORDER — DEXAMETHASONE SODIUM PHOSPHATE 10 MG/ML IJ SOLN
INTRAMUSCULAR | Status: AC
  Filled 2014-02-20: qty 1

## 2014-02-20 MED ORDER — LIDOCAINE HCL (CARDIAC) 20 MG/ML IV SOLN
INTRAVENOUS | Status: DC | PRN
  Administered 2014-02-20: 80 mg via INTRAVENOUS

## 2014-02-20 MED ORDER — ALBUTEROL SULFATE HFA 108 (90 BASE) MCG/ACT IN AERS
INHALATION_SPRAY | RESPIRATORY_TRACT | Status: AC
  Filled 2014-02-20: qty 6.7

## 2014-02-20 MED ORDER — CLONAZEPAM 0.5 MG PO TABS
0.5000 mg | ORAL_TABLET | Freq: Three times a day (TID) | ORAL | Status: DC | PRN
  Administered 2014-02-21: 0.5 mg via ORAL
  Filled 2014-02-20: qty 1

## 2014-02-20 MED ORDER — PROPOFOL 10 MG/ML IV BOLUS
INTRAVENOUS | Status: AC
  Filled 2014-02-20: qty 20

## 2014-02-20 MED ORDER — ATROPINE SULFATE 0.4 MG/ML IJ SOLN
INTRAMUSCULAR | Status: AC
  Filled 2014-02-20: qty 1

## 2014-02-20 MED ORDER — ONDANSETRON HCL 4 MG/2ML IJ SOLN
INTRAMUSCULAR | Status: DC | PRN
  Administered 2014-02-20 (×2): 2 mg via INTRAVENOUS

## 2014-02-20 MED ORDER — FENTANYL CITRATE 0.05 MG/ML IJ SOLN
INTRAMUSCULAR | Status: AC
  Filled 2014-02-20: qty 5

## 2014-02-20 MED ORDER — DEXAMETHASONE SODIUM PHOSPHATE 10 MG/ML IJ SOLN
INTRAMUSCULAR | Status: DC | PRN
  Administered 2014-02-20: 10 mg via INTRAVENOUS

## 2014-02-20 MED ORDER — LISINOPRIL 10 MG PO TABS: 20.0000 mg | ORAL_TABLET | Freq: Every day | ORAL | Status: DC

## 2014-02-20 MED ORDER — LACTATED RINGERS IV SOLN
INTRAVENOUS | Status: DC | PRN
Start: 2014-02-20 — End: 2014-02-20
  Administered 2014-02-20 (×2): via INTRAVENOUS

## 2014-02-20 MED ORDER — LACTATED RINGERS IV SOLN: INTRAVENOUS | Status: DC

## 2014-02-20 MED ORDER — METRONIDAZOLE 0.75 % VA GEL
1.0000 | Freq: Once | VAGINAL | Status: AC
  Administered 2014-02-20: 1 via VAGINAL
  Filled 2014-02-20: qty 70

## 2014-02-20 MED ORDER — POLYETHYLENE GLYCOL 3350 17 G PO PACK
17.0000 g | PACK | Freq: Every day | ORAL | Status: DC
Start: 2014-02-20 — End: 2014-02-22
  Administered 2014-02-21: 17 g via ORAL
  Filled 2014-02-20 (×2): qty 1

## 2014-02-20 MED ORDER — TOPIRAMATE 25 MG PO TABS
50.0000 mg | ORAL_TABLET | Freq: Two times a day (BID) | ORAL | Status: DC
  Administered 2014-02-20 – 2014-02-21 (×2): 50 mg via ORAL
  Filled 2014-02-20 (×5): qty 2

## 2014-02-20 MED ORDER — SUMATRIPTAN SUCCINATE 100 MG PO TABS
100.0000 mg | ORAL_TABLET | ORAL | Status: DC | PRN
  Administered 2014-02-20 – 2014-02-21 (×2): 100 mg via ORAL
  Filled 2014-02-20 (×2): qty 1

## 2014-02-20 MED ORDER — STERILE WATER FOR IRRIGATION IR SOLN
Status: DC | PRN
  Administered 2014-02-20: 10 mL

## 2014-02-20 MED ORDER — ALBUTEROL SULFATE (2.5 MG/3ML) 0.083% IN NEBU
INHALATION_SOLUTION | RESPIRATORY_TRACT | Status: AC
  Filled 2014-02-20: qty 3

## 2014-02-20 MED ORDER — PROMETHAZINE HCL 25 MG PO TABS
12.5000 mg | ORAL_TABLET | Freq: Three times a day (TID) | ORAL | Status: DC | PRN
  Administered 2014-02-20 – 2014-02-21 (×3): 12.5 mg via ORAL
  Filled 2014-02-20 (×3): qty 1

## 2014-02-20 MED ORDER — RIZATRIPTAN BENZOATE 10 MG PO TBDP: 10.0000 mg | ORAL_TABLET | ORAL | Status: DC | PRN

## 2014-02-20 MED ORDER — FENTANYL CITRATE 0.05 MG/ML IJ SOLN
INTRAMUSCULAR | Status: AC
  Filled 2014-02-20: qty 2

## 2014-02-20 MED ORDER — OXYCODONE-ACETAMINOPHEN 5-325 MG PO TABS
1.0000 | ORAL_TABLET | ORAL | Status: DC | PRN
  Administered 2014-02-21 (×3): 2 via ORAL
  Filled 2014-02-20 (×4): qty 2

## 2014-02-20 MED ORDER — ESTRADIOL 0.1 MG/GM VA CREA
TOPICAL_CREAM | VAGINAL | Status: AC
  Filled 2014-02-20: qty 85

## 2014-02-20 MED ORDER — SODIUM CHLORIDE 0.9 % IJ SOLN
INTRAMUSCULAR | Status: AC
  Filled 2014-02-20: qty 10

## 2014-02-20 MED ORDER — SENNOSIDES-DOCUSATE SODIUM 8.6-50 MG PO TABS
2.0000 | ORAL_TABLET | Freq: Every day | ORAL | Status: DC
  Administered 2014-02-20 – 2014-02-21 (×2): 2 via ORAL
  Filled 2014-02-20 (×3): qty 2

## 2014-02-20 MED ORDER — MIDAZOLAM HCL 2 MG/2ML IJ SOLN
INTRAMUSCULAR | Status: AC
  Filled 2014-02-20: qty 2

## 2014-02-20 MED ORDER — SODIUM CHLORIDE 0.9 % IJ SOLN
INTRAMUSCULAR | Status: AC
  Filled 2014-02-20: qty 50

## 2014-02-20 MED ORDER — ALBUTEROL SULFATE (2.5 MG/3ML) 0.083% IN NEBU: 2.5000 mg | INHALATION_SOLUTION | Freq: Four times a day (QID) | RESPIRATORY_TRACT | Status: DC | PRN

## 2014-02-20 MED ORDER — MEPERIDINE HCL 50 MG/ML IJ SOLN: 6.2500 mg | INTRAMUSCULAR | Status: DC | PRN

## 2014-02-20 MED ORDER — PANTOPRAZOLE SODIUM 40 MG PO TBEC: 40.0000 mg | DELAYED_RELEASE_TABLET | Freq: Every day | ORAL | Status: DC

## 2014-02-20 MED ORDER — BELLADONNA ALKALOIDS-OPIUM 16.2-60 MG RE SUPP
RECTAL | Status: AC
  Filled 2014-02-20: qty 1

## 2014-02-20 MED ORDER — BUPIVACAINE-EPINEPHRINE (PF) 0.25% -1:200000 IJ SOLN
INTRAMUSCULAR | Status: DC | PRN
  Administered 2014-02-20: 30 mL
  Administered 2014-02-20: 26 mL

## 2014-02-20 MED ORDER — PROPOFOL 10 MG/ML IV BOLUS
INTRAVENOUS | Status: DC | PRN
  Administered 2014-02-20: 100 mg via INTRAVENOUS
  Administered 2014-02-20: 190 mg via INTRAVENOUS

## 2014-02-20 MED ORDER — DIPHENHYDRAMINE HCL 50 MG/ML IJ SOLN: 12.5000 mg | Freq: Four times a day (QID) | INTRAMUSCULAR | Status: DC | PRN

## 2014-02-20 SURGICAL SUPPLY — 52 items
BAG DECANTER FOR FLEXI CONT (MISCELLANEOUS) ×3 IMPLANT
BAG URINE DRAINAGE (UROLOGICAL SUPPLIES) ×3 IMPLANT
BLADE HEX COATED 2.75 (ELECTRODE) IMPLANT
BLADE SURG 15 STRL LF DISP TIS (BLADE) ×1 IMPLANT
BLADE SURG 15 STRL SS (BLADE) ×2
BLADE SURG SZ10 CARB STEEL (BLADE) ×3 IMPLANT
CATH FOLEY 2WAY SLVR  5CC 16FR (CATHETERS) ×2
CATH FOLEY 2WAY SLVR  5CC 18FR (CATHETERS)
CATH FOLEY 2WAY SLVR 5CC 16FR (CATHETERS) ×1 IMPLANT
CATH FOLEY 2WAY SLVR 5CC 18FR (CATHETERS) IMPLANT
COVER SURGICAL LIGHT HANDLE (MISCELLANEOUS) ×6 IMPLANT
DECANTER SPIKE VIAL GLASS SM (MISCELLANEOUS) IMPLANT
DERMABOND ADVANCED (GAUZE/BANDAGES/DRESSINGS)
DERMABOND ADVANCED .7 DNX12 (GAUZE/BANDAGES/DRESSINGS) IMPLANT
DRAIN PENROSE 18X1/2 LTX STRL (DRAIN) ×3 IMPLANT
DRAPE CAMERA CLOSED 9X96 (DRAPES) ×3 IMPLANT
DRAPE LG THREE QUARTER DISP (DRAPES) ×3 IMPLANT
DRAPE TABLE BACK 44X90 PK DISP (DRAPES) IMPLANT
DRAPE UTILITY 15X26 (DRAPE) ×3 IMPLANT
ELECT REM PT RETURN 9FT ADLT (ELECTROSURGICAL)
ELECTRODE REM PT RTRN 9FT ADLT (ELECTROSURGICAL) IMPLANT
GLOVE BIOGEL M 8.0 STRL (GLOVE) ×9 IMPLANT
GLOVE BIOGEL M STRL SZ7.5 (GLOVE) ×9 IMPLANT
GOWN STRL REUS W/TWL XL LVL3 (GOWN DISPOSABLE) ×6 IMPLANT
KIT BASIN OR (CUSTOM PROCEDURE TRAY) ×3 IMPLANT
KIT SUPRAPUBIC CATH (MISCELLANEOUS) IMPLANT
MATRIX SURGICAL PSM 7X10CM (Tissue) ×3 IMPLANT
NEEDLE HYPO 22GX1.5 SAFETY (NEEDLE) ×3 IMPLANT
NS IRRIG 1000ML POUR BTL (IV SOLUTION) IMPLANT
PACK CYSTO (CUSTOM PROCEDURE TRAY) ×3 IMPLANT
PACK GENERAL/GYN (CUSTOM PROCEDURE TRAY) IMPLANT
PACKING VAGINAL (PACKING) ×3 IMPLANT
PENCIL BUTTON HOLSTER BLD 10FT (ELECTRODE) IMPLANT
PLUG CATH AND CAP STER (CATHETERS) ×3 IMPLANT
RETRACTOR LONRSTAR 16.6X16.6CM (MISCELLANEOUS) ×1 IMPLANT
RETRACTOR STAY HOOK 5MM (MISCELLANEOUS) ×3 IMPLANT
RETRACTOR STER APS 16.6X16.6CM (MISCELLANEOUS) ×3
SCRUB PCMX 4 OZ (MISCELLANEOUS) ×3 IMPLANT
SHEET LAVH (DRAPES) ×3 IMPLANT
SPONGE LAP 18X18 X RAY DECT (DISPOSABLE) IMPLANT
SPONGE LAP 4X18 X RAY DECT (DISPOSABLE) ×9 IMPLANT
SUCTION FRAZIER 12FR DISP (SUCTIONS) ×3 IMPLANT
SUT VIC AB 2-0 UR5 27 (SUTURE) ×6 IMPLANT
SUT VIC AB 2-0 UR6 27 (SUTURE) ×24 IMPLANT
SUT VIC AB 3-0 SH 27 (SUTURE) ×10
SUT VIC AB 3-0 SH 27XBRD (SUTURE) ×5 IMPLANT
SYRINGE 12CC LL (MISCELLANEOUS) ×3 IMPLANT
TOWEL OR NON WOVEN STRL DISP B (DISPOSABLE) ×3 IMPLANT
TUBING CONNECTING 10 (TUBING) ×2 IMPLANT
TUBING CONNECTING 10' (TUBING) ×1
WATER STERILE IRR 1500ML POUR (IV SOLUTION) IMPLANT
YANKAUER SUCT BULB TIP 10FT TU (MISCELLANEOUS) ×3 IMPLANT

## 2014-02-20 NOTE — Progress Notes (Signed)
ECG obtained per MD order. Normal ECG Interpretation. Will continue to monitor.

## 2014-02-20 NOTE — Transfer of Care (Signed)
Immediate Anesthesia Transfer of Care Note  Patient: MALEEKA SABATINO  Procedure(s) Performed: Procedure(s): POSTERIOR REPAIR (RECTOCELE) WITH VAGINAL VAULT REPAIR WITH ACELL GRAFT PLACEMENT, PERINEOPLASTY, ENTEROCELE REPAIR (N/A)  Patient Location: PACU  Anesthesia Type:General  Level of Consciousness: awake, oriented, patient cooperative, lethargic and responds to stimulation  Airway & Oxygen Therapy: Patient Spontanous Breathing and Patient connected to face mask oxygen  Post-op Assessment: Report given to PACU RN, Post -op Vital signs reviewed and stable and Patient moving all extremities  Post vital signs: Reviewed and stable  Complications: No apparent anesthesia complications

## 2014-02-20 NOTE — H&P (Signed)
Reason For Visit 3 mo f/u   Active Problems Problems  1. 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 She has a vaginal bulge which she has been told is a rectocele. She is s/p anterior vaginal vault repair with Claiborne Billings plication using [employer] Uphold sacrospinous mesh fixation, and Solyx mid-urethral sling 06/06/13.    She has a recent hx of MRSA in armpits. She is due for her knee surgery ( Dr. Mayer Camel).     Hx of incomplete bladder emptying and weak urinary stream after discharge. She was started on Rapaflo 8 mg 1 po daily, and instructed to resolve constipation with Mag Citrate. 07/08/13 felt she was emptying with Tamsulosin. Constipation is still present post Mag Citrate and daily Miralax. Recommendation 07/08/13 was to f/u w/GI (Dr. Olevia Perches) secondary to weight loss and constipation.   Past Medical History Problems  1. History of Anxiety (300.00) 2. History of arthritis (V13.4) 3. History of gastroesophageal reflux (GERD) (V12.79) 4. History of hiatal hernia (V12.79) 5. History of hyperlipidemia (V12.29) 6. History of hypertension (V12.59) 7. History of methicillin resistant Staphylococcus aureus infection (V12.04) 8. History of neuropathy (V12.49) 9. History of stroke (V12.54) 10. History of Obstructive sleep apnea, adult (327.23) 11. History of Vitamin D deficiency (268.9)  Surgical History Problems  1. History of Anterior Colporrhaphy, Repair Of Cystocele 2. History of Esophageal Dilation 3. History of Knee Arthroscopy 4. History of Mid-Urethral Sling Operation 5. History of Sacrospinous Ligament Fixation For Posthysterectomy  Prolapse 6. History of Tubal Ligation  Current Meds 1. AmLODIPine Besylate 10 MG Oral Tablet;  Therapy: (Recorded:27Oct2014) to Recorded 2. Aspirin 81 MG Oral Tablet;  Therapy:  (Recorded:27Oct2014) to Recorded 3. Ciprofloxacin HCl - 500 MG Oral Tablet; Take 1 tablet twice daily;  Therapy: [id number] to (Last Rx:14Jan2015)  Requested for: [id number]  Ordered 4. Nortriptyline HCl - 10 MG Oral Capsule;  Therapy: (Recorded:27Oct2014) to Recorded 5. Phenazopyridine HCl - 200 MG Oral Tablet; TAKE 1 TABLET 3 TIMES  DAILY AS NEEDED FOR PAIN;  Therapy: [id number] to (Evaluate:03Feb2015)  Requested for: [id number];  Last Rx:14Jan2015 Ordered 6. Tamsulosin HCl - 0.4 MG Oral Capsule; TAKE 1 CAPSULE BY MOUTH   DAILY;  Therapy: [id number] to (Last Rx:11Dec2014)  Requested for: [id number]  Ordered 7. Topiramate 50 MG Oral Tablet;  Therapy: (Recorded:27Oct2014) to Recorded  Allergies Medication  1. No Known Drug Allergies  Family History Problems  1. Family history of Deceased : Father, Mother 2. Family history of cardiac disorder (V17.49) : Mother 3. Family history of malignant neoplasm (V16.9) : Father 4. Family history of tuberculosis (V18.8)  Social History Problems  1. Alcohol use 2. Caffeine use (V49.89)   1 per day 3. Married 4. Never smoker 5. No alcohol use 6. Number of children   1 son, 2 daughters  Review of Systems Genitourinary, constitutional, skin, eye, otolaryngeal, hematologic/lymphatic, cardiovascular, pulmonary, endocrine, musculoskeletal, gastrointestinal, neurological and psychiatric system(s) were reviewed and pertinent findings if present are noted.  Genitourinary: vaginal pain and bladder pain.  Gastrointestinal: constipation.    Vitals Vital Signs [Data Includes: Last 1 Day]  Recorded: 28Apr2015 12:23PM  Blood Pressure: 155 / 99 Temperature: 97.9 F Heart Rate: 91  Physical Exam Constitutional: Well nourished and well developed . No acute distress.  ENT:. The ears and nose are normal in appearance.  Neck: The appearance of the neck is normal and no neck mass is present.  Pulmonary: No respiratory distress . No accessory muscle use.   Cardiovascular:. No peripheral edema.  Abdomen: The abdomen is obese, but not distended. The abdomen is soft and nontender. No masses are palpated. No CVA tenderness. Bowel sounds are normal. No hernias are palpable. No hepatosplenomegaly noted.  Genitourinary:  Chaperone Present: kim lewis.  Examination of the external genitalia shows normal female external genitalia. The urethra is not tender and no urethral caruncle. There is no urethral mass. Urethral hypermobility is not present. There is no urethral discharge. Vaginal exam demonstrates the vaginal epithelium to be poorly estrogenized, but no atrophy. No cystocele is identified. No enterocele is identified. A rectocele is present with a midline defect (grade 3 /4). The bladder is normal on palpation.  Lymphatics: The femoral and inguinal nodes are not enlarged or tender.  Skin: Normal skin turgor, no visible rash and no visible skin lesions.  Neuro/Psych:. Mood and affect are appropriate.    Results/Data Urine [Data Includes: Last 1 Day]   28Apr2015  COLOR YELLOW   APPEARANCE CLEAR   SPECIFIC GRAVITY 1.020   pH 7.0   GLUCOSE NEG mg/dL  BILIRUBIN NEG   KETONE NEG mg/dL  BLOOD TRACE   PROTEIN TRACE mg/dL  UROBILINOGEN 0.2 mg/dL  NITRITE NEG   LEUKOCYTE ESTERASE SMALL   SQUAMOUS EPITHELIAL/HPF NONE SEEN   WBC 7-10 WBC/hpf  RBC 0-2 RBC/hpf  BACTERIA NONE SEEN   CRYSTALS NONE SEEN   CASTS NONE SEEN   Other MODERATE AMORPHOUS MATERIAL    PVR: Ultrasound PVR 22 ml.    Assessment Assessed  1. 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 She has rectocele, grade 2, which may tug on her apex and cause discomfort. She would need posterior vault repair, somewhat complicated by her knee immobility. Note her history of MRSA Rx septra.   Plan Health Maintenance  1. UA With REFLEX; [Do Not Release]; Status:Resulted - Requires  Verification;   Done:  [id number] 12:06PM  Rectocele repair. Will plan for WL because of complex medical history.   Discussion/Summary cc: Dr. Vista Lawman   Dr. Cristal Generous     Amendment  Discharge on Flagyl 500mg  BID x 7 days.1     1 Amended By: Carolan Clines; Feb 17 2014 8:14 AM EST  Signatures Electronically signed by : Carolan Clines, M.D.; Feb 17 2014  8:14AM EST

## 2014-02-20 NOTE — Anesthesia Procedure Notes (Signed)
Procedure Name: LMA Insertion Date/Time: 02/20/2014 9:27 AM Performed by: Ofilia Neas Pre-anesthesia Checklist: Patient identified, Timeout performed, Emergency Drugs available, Suction available and Patient being monitored Patient Re-evaluated:Patient Re-evaluated prior to inductionOxygen Delivery Method: Circle system utilized Preoxygenation: Pre-oxygenation with 100% oxygen Intubation Type: IV induction LMA Size: 4.0 Number of attempts: 1 Placement Confirmation: positive ETCO2 Tube secured with: Tape Dental Injury: Teeth and Oropharynx as per pre-operative assessment

## 2014-02-20 NOTE — Consult Note (Signed)
PULMONARY / CRITICAL CARE MEDICINE   Name: Ann [name] MRN: [id number] DOB: September 06, 1955    ADMISSION DATE:  02/20/2014 CONSULTATION DATE:  02/20/14  REFERRING MD :  Dr. Gaynelle Arabian  CHIEF COMPLAINT:  Post-Op hypoxemia, ? 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  Intraoperatively, she developed respiratory distress and was changed from and LMA to an endotracheal tube.  In PACU, patient had a difficult time maintaining saturations and was placed on BiPAP.  PCCM consulted for evaluation.    STUDIES:    SIGNIFICANT EVENTS: 8/17  Admit for posterior pelvic floor repair in setting of vaginal vault prolapse, concern for aspiration intra-op.   PCCM consulted.    HISTORY OF PRESENT ILLNESS:  58 y/o F with a PMH of HTN, HLD, TIA's with no residual, MRSA infection 09/2013 in axilla, morbid obesity, frequent UTI's, stress incontinence and vaginal vault / rectal prolapse who was admitted 8/17 for planned posterior pelvic floor repair for vaginal vault prolapse.  Intraoperatively, she developed respiratory distress and was changed from and LMA to an endotracheal tube.  Patient was extubated and returned to recovery.  In PACU, the patient had a difficult time maintaining saturations and was placed on BiPAP with improvement.  CXR evaluation was concerning for RUL airspace disease vs density.  PCCM consulted for evaluation.     PAST MEDICAL HISTORY :  Past Medical History  Diagnosis Date  . Hypertension   . Arthritis   . GERD (gastroesophageal reflux disease)   . Hyperlipidemia   . History of recurrent UTIs   . H/O hiatal hernia   . History of esophageal dilatation   . History of TIAs NO RESIDUAL    1980;  2005;   2008 PT STATES PER CT  SCARRING RIGHT SIDE OF BRAIN  . Diverticulosis of colon   . SUI (stress urinary incontinence, female)   . Chronic cystitis   . Vaginal vault prolapse   . OSA on CPAP     PER  SLEEP STUDY 09-08-2012  MODERATE OSA  . Borderline diabetes   . Nocturia   . Anxiety   . Rectal prolapse   . Neuropathy   . Pneumonia     hx of   . Headache(784.0)     severe headaches   . Anemia     hx of   . MRSA infection     hx of 09/2013    Past Surgical History  Procedure Laterality Date  . Knee arthroscopy Bilateral 2008  . Esophageal dilation  X2  LAST ONE  JUNE 2014  . Tubal ligation  1987  . Anterior and posterior repair N/A 06/06/2013    Procedure: Anterior vaginal vault repair, Sacrospinous ligament fixation with UPHOLD lite, Kelly plication, Sacrospinous mesh fixation, Solyx transurethral sling;  Surgeon: Ailene Rud, MD;  Location: [facility];  Service: Urology;  Laterality: N/A;   Prior to Admission medications   Medication Sig Start Date End Date Taking? Authorizing Provider  amLODipine (NORVASC) 10 MG tablet Take 10 mg by mouth 2 (two) times daily.   Yes Historical Provider, MD  clonazePAM (KLONOPIN) 0.5 MG tablet Take 0.5 mg by mouth 3 (three) times daily as needed for anxiety.   Yes Historical Provider, MD  lisinopril (PRINIVIL,ZESTRIL) 20 MG tablet Take 20 mg by mouth at bedtime.    Yes Historical Provider, MD  omeprazole (PRILOSEC) 40 MG capsule Take 40 mg by mouth 2 (two)  times daily.   Yes Historical Provider, MD  topiramate (TOPAMAX) 50 MG tablet Take 50 mg by mouth 2 (two) times daily.   Yes Historical Provider, MD  albuterol (PROVENTIL HFA;VENTOLIN HFA) 108 (90 BASE) MCG/ACT inhaler Inhale 2 puffs into the lungs every 4 (four) hours as needed for wheezing. 08/01/12   Sheila Oats, MD  aspirin EC 81 MG tablet Take 81 mg by mouth every evening.    Historical Provider, MD  Multiple Vitamin (MULTIVITAMIN WITH MINERALS) TABS tablet Take 1 tablet by mouth daily.    Historical Provider, MD  polyethylene glycol (MIRALAX / GLYCOLAX) packet Take 17 g by mouth daily.    Historical Provider, MD  promethazine (PHENERGAN) 12.5 MG tablet Take 1  tablet (12.5 mg total) by mouth every 8 (eight) hours as needed for nausea or vomiting. 09/27/13   Carlyle Basques, MD  rizatriptan (MAXALT-MLT) 10 MG disintegrating tablet Take 1 tablet (10 mg total) by mouth as needed for migraine. May repeat in 2 hours if needed 02/21/13   Marcial Pacas, MD   No Known Allergies  FAMILY HISTORY:  Family History  Problem Relation Age of Onset  . Colon cancer Father   . Esophageal cancer Neg Hx   . Rectal cancer Neg Hx   . Stomach cancer Neg Hx   . Heart disease Mother    SOCIAL HISTORY:  reports that she has never smoked. She has never used smokeless tobacco. She reports that she does not drink alcohol or use illicit drugs.  REVIEW OF SYSTEMS:  Unable to complete as patient is on bipap.   SUBJECTIVE:   VITAL SIGNS: Temp:  [97.6 F (36.4 C)-99.2 F (37.3 C)] 99.2 F (37.3 C) (08/17 1530) Pulse Rate:  [93-103] 94 (08/17 1530) Resp:  [14-22] 17 (08/17 1530) BP: (134-157)/(71-96) 157/84 mmHg (08/17 1530) SpO2:  [90 %-99 %] 94 % (08/17 1530) FiO2 (%):  [40 %] 40 % (08/17 1530) Weight:  [229 lb 4.5 oz (104 kg)] 229 lb 4.5 oz (104 kg) (08/17 1530)  HEMODYNAMICS:    VENTILATOR SETTINGS: Vent Mode:  [-] PSV;CPAP FiO2 (%):  [40 %] 40 % Set Rate:  [15 bmp] 15 bmp PEEP:  [5 cmH20] 5 cmH20  INTAKE / OUTPUT:  Intake/Output Summary (Last 24 hours) at 02/20/14 1555 Last data filed at 02/20/14 1500  Gross per 24 hour  Intake   1929 ml  Output    335 ml  Net   1594 ml    PHYSICAL EXAMINATION: General:  Morbidly obese female in NAD  Neuro:  Drowsy, arouses easily, speech clear, MAE HEENT:  Mm pink/moist, no jvd Cardiovascular:  s1s2 tachy, no m/r/g Lungs:  resp's even/non-labored, slightly diminished on R, left clear  Abdomen:  Obese, soft, bsx4 active  Musculoskeletal:  No acute deformities  Skin:  Warm/dry, trace LE edema  LABS:  CBC  Recent Labs Lab 02/14/14 1400  WBC 5.4  HGB 12.2  HCT 38.0  PLT 343   Coag's No results found for  this basename: APTT, INR,  in the last 168 hours  BMET  Recent Labs Lab 02/14/14 1400  NA 145  K 4.8  CL 107  CO2 28  BUN 15  CREATININE 1.17*  GLUCOSE 112*   Electrolytes  Recent Labs Lab 02/14/14 1400  CALCIUM 9.7   Sepsis Markers No results found for this basename: LATICACIDVEN, PROCALCITON, O2SATVEN,  in the last 168 hours  ABG No results found for this basename: PHART, PCO2ART, PO2ART,  in the  last 168 hours  Liver Enzymes No results found for this basename: AST, ALT, ALKPHOS, BILITOT, ALBUMIN,  in the last 168 hours  Cardiac Enzymes No results found for this basename: TROPONINI, PROBNP,  in the last 168 hours  Glucose No results found for this basename: GLUCAP,  in the last 168 hours  Imaging No results found.   ASSESSMENT / PLAN:  PULMONARY A: Acute Hypoxic Respiratory Failure RUL Opacity - ill defined in appearance. I am concerned that there might be suprahilar fullness as well. This would be a very unusual location for aspiration.  OSA / OHS - on CPAP at home P:   Continue BiPAP support for now until patient more alert OK to transition back to CPAP QHS once post acute phase Oxygen to support saturations > 92% Pulmonary Hygiene:  IS, Mobilize NPO until able to maintain saturations off BiPAP Follow up CXR in am  If no CXR resolution, consider repeat CT Chest to evaluate RUL area  PRN albuterol   CARDIOVASCULAR A:  Tachycardia - mild, post op in setting of pain  HTN - on norvasc 10, lisinopril 20 at baseline P:  SDU monitoring  Continue norvasc Hold Lisinopril  Troponin x1 with EKG  RENAL A:   Mild elevation of Sr Cr - 1.17 prior to admission, appears to be baseline ? And is on ACE-I P:   Trend sr cr  Hold ACE-I, reassess in am   GASTROINTESTINAL / GU A:   Morbid Obesity  Aspiration Event GERD Post Pelvic Floor Repair for Vaginal Vault Prolapse P:   NPO for now Continue PPI as is home medication  Dilaudid & Percocet PRN for  pain Bowel regimen: miralax, sennokot-s Vaginal packing / post op care per Dr. Gaynelle Arabian PRN zofran / phenergan for nausea Avoid straining / increased abdominal pressures  HEMATOLOGIC A:   No acute issues  P:  DVT Proph: SCD's   INFECTIOUS A:   RUL Airspace Opacity - doubt PNA, concern for RUL opacity.  Aspiration vs density with hilar adenopathy  Hx MRSA P:   Abx: Bactrim DS, start date 8/17 (hx MRSA, post-op coverage), day 1/x Hold abx for aspiration, hopeful to see clearing of airspace disease in am  Monitor fever curve / leukocytosis See Pulmonary   ENDOCRINE A:   Hyperglycemia - mild, carries dx of "pre-diabetes" as outpatient  P:   Monitor glucose on BMP, if greater than 180, consider adding SSI   NEUROLOGIC A:   Hx Migraine HA Anxiety  Insomnia  P:   RASS goal: n/a Continue ambien, maxalt, topamax, klonopin   TODAY'S SUMMARY: 58 y/o F admitted for posterior pelvic floor repair 8/17.  Concerns for aspiration with LMA in place & transitioned to ETT intra-op.  Extubated post procedure and had difficulty maintaining saturations.  Placed on bipap with improvement.  Drowsy post op but arouses / appropriate.  CXR with RUL airspace disease.    Noe Gens, NP-C [facility] Pgr: [phone] or [phone]   I have personally obtained a history, examined the patient, evaluated laboratory and imaging results, formulated the assessment and plan and placed orders.  Merton Border, MD ; [facility] [phone].  After 5:30 PM or weekends, call [phone]  02/20/2014, 3:55 PM

## 2014-02-20 NOTE — Anesthesia Preprocedure Evaluation (Signed)
Anesthesia Evaluation  Patient identified by MRN, date of birth, ID band Patient awake    Reviewed: Allergy & Precautions, H&P , NPO status , Patient's Chart, lab work & pertinent test results  History of Anesthesia Complications (+) Family history of anesthesia reaction and history of anesthetic complications (Pt states sister died following GA from OSA complications)  Airway Mallampati: II TM Distance: >3 FB Neck ROM: Full    Dental no notable dental hx.    Pulmonary sleep apnea and Continuous Positive Airway Pressure Ventilation ,  breath sounds clear to auscultation  Pulmonary exam normal       Cardiovascular hypertension, Pt. on medications negative cardio ROS  Rhythm:Regular Rate:Normal     Neuro/Psych TIAnegative neurological ROS  negative psych ROS   GI/Hepatic Neg liver ROS, hiatal hernia, GERD-  ,  Endo/Other  negative endocrine ROS  Renal/GU negative Renal ROS  negative genitourinary   Musculoskeletal negative musculoskeletal ROS (+)   Abdominal   Peds negative pediatric ROS (+)  Hematology negative hematology ROS (+)   Anesthesia Other Findings   Reproductive/Obstetrics negative OB ROS                           Anesthesia Physical Anesthesia Plan  ASA: II  Anesthesia Plan: General   Post-op Pain Management:    Induction: Intravenous  Airway Management Planned: Oral ETT  Additional Equipment:   Intra-op Plan:   Post-operative Plan: Extubation in OR  Informed Consent: I have reviewed the patients History and Physical, chart, labs and discussed the procedure including the risks, benefits and alternatives for the proposed anesthesia with the patient or authorized representative who has indicated his/her understanding and acceptance.   Dental advisory given  Plan Discussed with: CRNA  Anesthesia Plan Comments:         Anesthesia Quick Evaluation                                   Anesthesia Evaluation    History of Anesthesia Complications (+) Family history of anesthesia reaction and history of anesthetic complications (Patient states that sister died following general anesthesia with complications thought to be related to undiagnosed OSA.)  Airway Mallampati: III TM Distance: <3 FB   Mouth opening: Limited Mouth Opening  Dental  (+) Poor Dentition, Missing and Dental Advisory Given   Pulmonary sleep apnea and Continuous Positive Airway Pressure Ventilation ,  Patient states history of frequent pneumonia; denies respiratory symptoms the past 10 months. breath sounds clear to auscultation  Pulmonary exam normal       Cardiovascular hypertension, Pt. on medications Rhythm:Regular Rate:Normal     Neuro/Psych Anxiety TIA Neuromuscular disease    GI/Hepatic hiatal hernia, GERD-  Medicated,  Endo/Other  diabetes (Borderline DM)  Renal/GU      Musculoskeletal   Abdominal   Peds  Hematology   Anesthesia Other Findings   Reproductive/Obstetrics                          Anesthesia Physical Anesthesia Plan  ASA: III  Anesthesia Plan: General   Post-op Pain Management:    Induction: Intravenous  Airway Management Planned: Oral ETT  Additional Equipment:   Intra-op Plan:   Post-operative Plan: Extubation in OR  Informed Consent: I have reviewed the patients History and Physical, chart, labs and discussed the procedure including the risks,  benefits and alternatives for the proposed anesthesia with the patient or authorized representative who has indicated his/her understanding and acceptance.   Dental advisory given  Plan Discussed with: CRNA  Anesthesia Plan Comments:        Anesthesia Quick Evaluation

## 2014-02-20 NOTE — Op Note (Signed)
Pre-operative diagnosis :   Posterior vaginal vault prolapse, stage III  Postoperative diagnosis:  Posterior vaginal vault prolapse with rectocele, enterocele, and peroneal body weakness.  Operation:  Posterior vaginal vault prolapse with plication, ACell graft implantation, enterocele repair with ACell graft implantation, , peroneal body plication with ACell graft support  Surgeon:  S. Gaynelle Arabian, MD  First assistant:  Park Liter, MD  Anesthesia:  General LMA, converted to GET  Preparation:  After appropriate preanesthesia, the patient is brought the operative room, placed on the operating table in the dorsal supine position where general LMA anesthesia was introduced. She was replaced in the dorsal lithotomy position with pubis was prepped with antibiotic soap solution, and draped in usual fashion. The history was reviewed. The arm band was double checked. It is noted that the patient has history of community acquired MRSA in her armpit, and for this reason, she was covered with sulfur antibiotic. Her nasal swab was negative for MRSA today.  It is noted that she has previously had anterior vault repair with sacrospinous fixation, and mesh graft augmentation in the past. This is healed well.  Review history:  Problems  1. 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 She has a vaginal bulge which she has been told is a rectocele. She is s/p anterior vaginal vault repair with Claiborne Billings plication using [employer] sacrospinous mesh fixation, and Solyx mid-urethral sling 06/06/13.  She has a recent hx of MRSA in armpits. She is due for her knee surgery ( Dr. Mayer Camel).  Hx of incomplete bladder emptying and weak urinary stream after discharge. She was started on Rapaflo 8 mg 1 po daily, and instructed to resolve constipation with Mag Citrate. 07/08/13  felt she was emptying with Tamsulosin. Constipation is still present post Mag Citrate and daily Miralax. Recommendation 07/08/13 was to f/u w/GI (Dr. Olevia Perches) secondary to weight loss and constipation.    Statement of  Likelihood of Success: Excellent. TIME-OUT observed.:  Procedure:  Vaginal vault examination shows reasonable apex support, and reasonable anterior vault support. There is no hypermobility of the urethra. There is a large, stage III posterior rectocele, to within 1 cm of the introitus. The patient had previously had an episiotomy for childbirth, and had a very thin perineal body from prior repair. I therefore elected to repair and  support the perineal body, and using a marking pen, outlined an incision through the perineal body, through the large rectocele, to within 1 cementer of the cervix. A 2-0 Vicryl suture was placed just posterior to the cervix is a marking suture.  50 cc of Marcaine 25 with epinephrine 1::200,000  was then injected to give postop analgesia, as well as tissue dissection. A 15 cm in length incision is made and subcutaneous tissue is dissected with the Strully scissors. Scar tissue was carefully dissected bilaterally. The perineal body is dissected. The rectocele was dissected lateralward until completely dissected. At the apex of the dissection, and enterocele is identified. It is  closed with 3-0 Vicryl suture. It also required separate buttressing sutures, after the patient developed some respiratory distress, requiring change from LMA anesthesia to endotracheal anesthesia.  The large remaining rectocele was closed with interrupted horizontal mattress 2-0 Vicryl sutures, with the suture knots carefully placed on the lateral side, so they would not be a position to cause erosion. Following  this, the wound was carefully irrigated with antibiotic irrigation. Small bleeding areas were electrocoagulated. We then measured the length and width of the operative area, and a 7 cm  x 3 cm portion of ACell Matristem was selected,and cut to size. This was then placed with the basement membrane toward the vaginal mucosa, and sutured in place. The vaginal mucosa was then closed with running 3-0 Vicryl suture. A separate 2 cm x 1 cm portion of a cell was then placed over the perineal body repair, and this was closed with interrupted 3-0 Vicryl suture. Irrigation was accomplished and no bleeding was noted.  Vaginal packing with metronidazole gel was accomplished. The patient received IV Tylenol. She was awakened and taken to recovery room in good condition.

## 2014-02-20 NOTE — Progress Notes (Signed)
02/20/14 1415  PACU Vital Signs  Resp 15  Oxygen Therapy  SpO2 92 %  O2 Device CPAP  O2 Flow Rate (L/min) 6 L/min  respiratory @ bs, cpap not maintaining o2 sats 88-92%, will try bipap, mds notified, orders in for chest xray and transfer to stepdown

## 2014-02-20 NOTE — Progress Notes (Signed)
Pt said she did not want to use her BiPAP tonight due to continuing comfort issues. On her [place] 02.

## 2014-02-20 NOTE — Progress Notes (Signed)
Urology Progress Note  Day of Surgery   Subjective:post op ck: {t in ICU: stepdown status: consult to ICU/pulmonary Pt now off bipap, voiding well.      No acute urologic events overnight. Ambulation:   negative Flatus:    negative Bowel movement  negative  Pain: some relief  Objective:  Blood pressure 144/74, pulse 96, temperature 98.3 F (36.8 C), temperature source Oral, resp. rate 17, height 5\' 3"  (1.6 m), weight 104 kg (229 lb 4.5 oz), SpO2 95.00%.  Physical Exam:  General:  No acute distress, awake Extremities: extremities normal, atraumatic, no cyanosis or edema Genitourinary: ;packing coming out. Otherwise neg exam post pp Foley: for removal in AM.     I/O last 3 completed shifts: In: 2018.3 [I.V.:2018.3] Out: 785 [Urine:750; Blood:35]  Recent Labs     02/20/14  1630  HGB  12.5  WBC  15.9*  PLT  334    Recent Labs     02/20/14  1630  NA  139  K  4.5  CL  103  CO2  25  BUN  15  CREATININE  1.14*  CALCIUM  8.9  GFRNONAA  53*  GFRAA  62*     No results found for this basename: PT, INR, APTT,  in the last 72 hours   No components found with this basename: ABG,   Assessment/Plan:  Catheter not removed.  Will plan for foley and packing out in AM

## 2014-02-20 NOTE — Anesthesia Postprocedure Evaluation (Signed)
  Anesthesia Post-op Note  Patient: Ann [name]  Procedure(s) Performed: Procedure(s) (LRB): POSTERIOR REPAIR (RECTOCELE) WITH VAGINAL VAULT REPAIR WITH ACELL GRAFT PLACEMENT, PERINEOPLASTY, ENTEROCELE REPAIR (N/A)  Patient Location: PACU  Anesthesia Type: General  Level of Consciousness: awake and alert   Airway and Oxygen Therapy: Patient Spontanous Breathing  Post-op Pain: mild  Post-op Assessment: Post-op Vital signs reviewed, Patient's Cardiovascular Status Stable, Respiratory Function Stable, Patent Airway and No signs of Nausea or vomiting  Last Vitals:  Filed Vitals:   02/20/14 1530  BP: 157/84  Pulse: 94  Temp: 37.3 C  Resp: 17    Post-op Vital Signs: stable   Complications: No apparent anesthesia complications

## 2014-02-20 NOTE — Progress Notes (Signed)
02/20/14 1345  Oxygen Therapy  SpO2 92 %  O2 Device Nasal cannula  O2 Flow Rate (L/min) 4 L/min  End [place] CO2 (EtCO2) 25  will try initiate pt home cpap. Pt sleepy, arousable, o2 sats 86-97% on 4Lnc, pt apenic.

## 2014-02-20 NOTE — Interval H&P Note (Signed)
History and Physical Interval Note:  02/20/2014 8:01 AM  Ann [name]  has presented today for surgery, with the diagnosis of RECTOCELE  The various methods of treatment have been discussed with the patient and family. After consideration of risks, benefits and other options for treatment, the patient has consented to  Procedure(s): POSTERIOR REPAIR (RECTOCELE) [place] (N/A) as a surgical intervention .  The patient's history has been reviewed, patient examined, no change in status, stable for surgery.  I have reviewed the patient's chart and labs.  Questions were answered to the patient's satisfaction.     Carolan Clines I

## 2014-02-20 NOTE — Progress Notes (Signed)
[place] notified because of patient packing has become dislodged and is laying in bed. Told to assess for signs of copious bleeding. Will continue to monitor patient.

## 2014-02-21 ENCOUNTER — Observation Stay (HOSPITAL_COMMUNITY): Payer: Medicaid Other

## 2014-02-21 DIAGNOSIS — Z6839 Body mass index (BMI) 39.0-39.9, adult: Secondary | ICD-10-CM | POA: Diagnosis not present

## 2014-02-21 DIAGNOSIS — E662 Morbid (severe) obesity with alveolar hypoventilation: Secondary | ICD-10-CM | POA: Diagnosis present

## 2014-02-21 DIAGNOSIS — R059 Cough, unspecified: Secondary | ICD-10-CM

## 2014-02-21 DIAGNOSIS — Z9851 Tubal ligation status: Secondary | ICD-10-CM | POA: Diagnosis not present

## 2014-02-21 DIAGNOSIS — K5900 Constipation, unspecified: Secondary | ICD-10-CM | POA: Diagnosis present

## 2014-02-21 DIAGNOSIS — G4733 Obstructive sleep apnea (adult) (pediatric): Secondary | ICD-10-CM | POA: Diagnosis present

## 2014-02-21 DIAGNOSIS — R05 Cough: Secondary | ICD-10-CM

## 2014-02-21 DIAGNOSIS — J690 Pneumonitis due to inhalation of food and vomit: Secondary | ICD-10-CM

## 2014-02-21 DIAGNOSIS — N816 Rectocele: Secondary | ICD-10-CM | POA: Diagnosis present

## 2014-02-21 DIAGNOSIS — Z8614 Personal history of Methicillin resistant Staphylococcus aureus infection: Secondary | ICD-10-CM | POA: Diagnosis not present

## 2014-02-21 DIAGNOSIS — E785 Hyperlipidemia, unspecified: Secondary | ICD-10-CM | POA: Diagnosis present

## 2014-02-21 DIAGNOSIS — J9600 Acute respiratory failure, unspecified whether with hypoxia or hypercapnia: Secondary | ICD-10-CM | POA: Diagnosis not present

## 2014-02-21 DIAGNOSIS — N8111 Cystocele, midline: Secondary | ICD-10-CM

## 2014-02-21 DIAGNOSIS — Z800 Family history of malignant neoplasm of digestive organs: Secondary | ICD-10-CM | POA: Diagnosis not present

## 2014-02-21 DIAGNOSIS — Z8673 Personal history of transient ischemic attack (TIA), and cerebral infarction without residual deficits: Secondary | ICD-10-CM | POA: Diagnosis not present

## 2014-02-21 DIAGNOSIS — G4700 Insomnia, unspecified: Secondary | ICD-10-CM | POA: Diagnosis present

## 2014-02-21 DIAGNOSIS — Z87440 Personal history of urinary (tract) infections: Secondary | ICD-10-CM | POA: Diagnosis not present

## 2014-02-21 DIAGNOSIS — Z8249 Family history of ischemic heart disease and other diseases of the circulatory system: Secondary | ICD-10-CM | POA: Diagnosis not present

## 2014-02-21 DIAGNOSIS — I10 Essential (primary) hypertension: Secondary | ICD-10-CM | POA: Diagnosis present

## 2014-02-21 DIAGNOSIS — K219 Gastro-esophageal reflux disease without esophagitis: Secondary | ICD-10-CM | POA: Diagnosis present

## 2014-02-21 DIAGNOSIS — E559 Vitamin D deficiency, unspecified: Secondary | ICD-10-CM | POA: Diagnosis present

## 2014-02-21 DIAGNOSIS — F411 Generalized anxiety disorder: Secondary | ICD-10-CM | POA: Diagnosis present

## 2014-02-21 DIAGNOSIS — N3020 Other chronic cystitis without hematuria: Secondary | ICD-10-CM | POA: Diagnosis present

## 2014-02-21 DIAGNOSIS — Z6841 Body Mass Index (BMI) 40.0 and over, adult: Secondary | ICD-10-CM | POA: Diagnosis not present

## 2014-02-21 DIAGNOSIS — M129 Arthropathy, unspecified: Secondary | ICD-10-CM | POA: Diagnosis present

## 2014-02-21 DIAGNOSIS — D72829 Elevated white blood cell count, unspecified: Secondary | ICD-10-CM | POA: Diagnosis not present

## 2014-02-21 DIAGNOSIS — Z831 Family history of other infectious and parasitic diseases: Secondary | ICD-10-CM | POA: Diagnosis not present

## 2014-02-21 MED ORDER — PANTOPRAZOLE SODIUM 40 MG PO TBEC
40.0000 mg | DELAYED_RELEASE_TABLET | Freq: Two times a day (BID) | ORAL | Status: DC
  Administered 2014-02-21 (×2): 40 mg via ORAL
  Filled 2014-02-21 (×4): qty 1

## 2014-02-21 NOTE — Progress Notes (Signed)
UR completed 

## 2014-02-21 NOTE — Progress Notes (Signed)
Assumed care of this patient at [facility], patient alert and sitting up in bed. Agree with previous RN's assessment. Vaginal packing and foley catheter removed per order by Dr. Gaynelle Arabian. Will continue to monitor.

## 2014-02-21 NOTE — Progress Notes (Signed)
PULMONARY / CRITICAL CARE MEDICINE   Name: Ann [name] MRN: [id number] DOB: 01-03-1956    ADMISSION DATE:  02/20/2014 CONSULTATION DATE:  02/20/14  REFERRING MD :  Dr. Gaynelle Arabian  CHIEF COMPLAINT:  Post-Op hypoxemia, ? 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  Intraoperatively, she developed respiratory distress and was changed from and LMA to an endotracheal tube.  In PACU, patient had a difficult time maintaining saturations and was placed on BiPAP.  PCCM consulted for evaluation.    STUDIES:    SIGNIFICANT EVENTS: 8/17  Admit for posterior pelvic floor repair in setting of vaginal vault prolapse, concern for aspiration intra-op.   PCCM consulted.  8/18  Resolved respiratory issues.     SUBJECTIVE:  Pt reports difficulty with pain control, no acute events.  Did not wear bipap overnight. Afebrile, VSS  VITAL SIGNS: Temp:  [97.7 F (36.5 C)-99.2 F (37.3 C)] 97.8 F (36.6 C) (08/18 0400) Pulse Rate:  [80-103] 98 (08/18 0800) Resp:  [12-26] 20 (08/18 0800) BP: (124-159)/(70-96) 133/83 mmHg (08/18 0800) SpO2:  [90 %-99 %] 94 % (08/18 0800) FiO2 (%):  [40 %] 40 % (08/17 1635) Weight:  [229 lb 4.5 oz (104 kg)] 229 lb 4.5 oz (104 kg) (08/17 1530)  VENTILATOR SETTINGS: Vent Mode:  [-] PCV;CPAP FiO2 (%):  [40 %] 40 % Set Rate:  [15 bmp] 15 bmp PEEP:  [5 cmH20] 5 cmH20  INTAKE / OUTPUT:  Intake/Output Summary (Last 24 hours) at 02/21/14 0830 Last data filed at 02/21/14 0800  Gross per 24 hour  Intake 2498.33 ml  Output   3685 ml  Net -1186.67 ml    PHYSICAL EXAMINATION: General:  Morbidly obese female in NAD  Neuro:  AAOx4, speech clear, MAE HEENT:  Mm pink/moist, no jvd Cardiovascular:  s1s2 tachy, no m/r/g Lungs:  resp's even/non-labored, slightly diminished on R, left clear  Abdomen:  Obese, soft, bsx4 active  Musculoskeletal:  No acute deformities  Skin:  Warm/dry, trace LE  edema  LABS:  CBC  Recent Labs Lab 02/14/14 1400 02/20/14 1630  WBC 5.4 15.9*  HGB 12.2 12.5  HCT 38.0 38.2  PLT 343 334   BMET  Recent Labs Lab 02/14/14 1400 02/20/14 1630  NA 145 139  K 4.8 4.5  CL 107 103  CO2 28 25  BUN 15 15  CREATININE 1.17* 1.14*  GLUCOSE 112* 166*   Electrolytes  Recent Labs Lab 02/14/14 1400 02/20/14 1630  CALCIUM 9.7 8.9   Liver Enzymes  Recent Labs Lab 02/20/14 1630  AST 18  ALT 13  ALKPHOS 77  BILITOT 0.3  ALBUMIN 3.6   Cardiac Enzymes  Recent Labs Lab 02/20/14 1630  TROPONINI <0.30   Glucose No results found for this basename: GLUCAP,  in the last 168 hours  Imaging Portable Chest Xray - Atelectasis  02/20/2014   CLINICAL DATA:  Atelectasis.  Aspiration.  EXAM: PORTABLE CHEST - 1 VIEW  COMPARISON:  06/07/2013.  FINDINGS: Cardiopericardial silhouette appears within normal limits. There is patchy airspace density in the RIGHT upper lobe which is new compared to prior chest radiographs. In general, differential considerations are asymmetric edema, pneumonia, or aspiration pneumonitis. Aspiration in this location is uncommon however this depends on positioning of the patient for the operation. Typically aspiration pneumonitis resolves fairly quickly and follow-up radiograph should be considered. No pneumothorax. Monitoring leads project over the chest.  IMPRESSION: Patchy RIGHT upper lobe airspace  disease which may represent aspiration depending on patient position during operation or asymmetric pulmonary edema.   Electronically Signed   By: Dereck Ligas M.D.   On: 02/20/2014 14:57     ASSESSMENT / PLAN:  PULMONARY A: Acute Hypoxic Respiratory Failure RUL Opacity - ill defined in appearance. I am concerned that there might be suprahilar fullness as well. This would be a very unusual location for aspiration but she was in trendelenburg during the procedure.  OSA / OHS - on CPAP at home P:   CPAP QHS  Oxygen to  support saturations > 92% Pulmonary Hygiene:  IS, Mobilize Recommend follow up CXR with primary MD to ensure clearance of RUL airspace disease post discharge.    CARDIOVASCULAR A:  Tachycardia - mild, post op in setting of pain  HTN - on norvasc 10, lisinopril 20 at baseline P:  Ok to transition to floor Continue norvasc Hold Lisinopril, consider restart in am 8/19 Troponin & EKG nml 8/17  RENAL A:   Mild elevation of Sr Cr - 1.17 prior to admission, appears to be baseline ? 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 Gaynelle Arabian PRN zofran / phenergan for nausea Avoid straining / increased abdominal pressures  HEMATOLOGIC A:   No acute issues  P:  DVT Proph: SCD's   INFECTIOUS A:   RUL Airspace Opacity - doubt PNA, concern for RUL opacity.  Likely aspiration.  Hx MRSA P:   Abx: Bactrim DS, start date 8/17 (hx MRSA, post-op coverage), day 2/x No further abx needed for aspiration.   Monitor fever curve / leukocytosis   ENDOCRINE A:   Hyperglycemia - mild, carries dx of "pre-diabetes" as outpatient  P:   Monitor glucose on BMP, if greater than 180, consider adding SSI   NEUROLOGIC A:   Hx Migraine HA Anxiety  Insomnia  P:   RASS goal: n/a Continue ambien, maxalt, topamax, klonopin   TODAY'S SUMMARY: 58 y/o F admitted for posterior pelvic floor repair 8/17.  Concerns for aspiration with LMA in place & transitioned to ETT intra-op.  Extubated post procedure and had difficulty maintaining saturations.  Placed on bipap with improvement.  CXR with RUL airspace disease, repeat cxr with clearing of airspace disease.  Ok  to transfer to floor.    PCCM will be available PRN.   Noe Gens, NP-C [facility] Pgr: [phone] or [phone]   02/21/2014, 8:30 AM  Attending:  I have seen and examined the patient with nurse practitioner/resident and agree with the note above.   Needs f/u CXR as outpatient 2-3 weeks to ensure complete clearing of infiltrate  PCCM to sign off  Roselie Awkward, MD [facility] Pager: [phone] Cell: [phone] If no response, call [phone]

## 2014-02-21 NOTE — Consult Note (Deleted)
PULMONARY / CRITICAL CARE MEDICINE   Name: Ann [name] MRN: [id number] DOB: 1955-07-16    ADMISSION DATE:  02/20/2014 CONSULTATION DATE:  02/20/14  REFERRING MD :  Dr. Gaynelle Arabian  CHIEF COMPLAINT:  Post-Op hypoxemia, ? 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  Intraoperatively, she developed respiratory distress and was changed from and LMA to an endotracheal tube.  In PACU, patient had a difficult time maintaining saturations and was placed on BiPAP.  PCCM consulted for evaluation.    STUDIES:    SIGNIFICANT EVENTS: 8/17  Admit for posterior pelvic floor repair in setting of vaginal vault prolapse, concern for aspiration intra-op.   PCCM consulted.  8/18  Resolved respiratory issues.     SUBJECTIVE:  Pt reports difficulty with pain control, no acute events.  Did not wear bipap overnight. Afebrile, VSS  VITAL SIGNS: Temp:  [97.7 F (36.5 C)-99.2 F (37.3 C)] 97.8 F (36.6 C) (08/18 0400) Pulse Rate:  [80-103] 98 (08/18 0800) Resp:  [12-26] 20 (08/18 0800) BP: (124-159)/(70-96) 133/83 mmHg (08/18 0800) SpO2:  [90 %-99 %] 94 % (08/18 0800) FiO2 (%):  [40 %] 40 % (08/17 1635) Weight:  [229 lb 4.5 oz (104 kg)] 229 lb 4.5 oz (104 kg) (08/17 1530)  VENTILATOR SETTINGS: Vent Mode:  [-] PCV;CPAP FiO2 (%):  [40 %] 40 % Set Rate:  [15 bmp] 15 bmp PEEP:  [5 cmH20] 5 cmH20  INTAKE / OUTPUT:  Intake/Output Summary (Last 24 hours) at 02/21/14 0809 Last data filed at 02/21/14 0800  Gross per 24 hour  Intake 2498.33 ml  Output   3685 ml  Net -1186.67 ml    PHYSICAL EXAMINATION: General:  Morbidly obese female in NAD  Neuro:  AAOx4, speech clear, MAE HEENT:  Mm pink/moist, no jvd Cardiovascular:  s1s2 tachy, no m/r/g Lungs:  resp's even/non-labored, slightly diminished on R, left clear  Abdomen:  Obese, soft, bsx4 active  Musculoskeletal:  No acute deformities  Skin:  Warm/dry, trace LE  edema  LABS:  CBC  Recent Labs Lab 02/14/14 1400 02/20/14 1630  WBC 5.4 15.9*  HGB 12.2 12.5  HCT 38.0 38.2  PLT 343 334   BMET  Recent Labs Lab 02/14/14 1400 02/20/14 1630  NA 145 139  K 4.8 4.5  CL 107 103  CO2 28 25  BUN 15 15  CREATININE 1.17* 1.14*  GLUCOSE 112* 166*   Electrolytes  Recent Labs Lab 02/14/14 1400 02/20/14 1630  CALCIUM 9.7 8.9   Liver Enzymes  Recent Labs Lab 02/20/14 1630  AST 18  ALT 13  ALKPHOS 77  BILITOT 0.3  ALBUMIN 3.6   Cardiac Enzymes  Recent Labs Lab 02/20/14 1630  TROPONINI <0.30   Glucose No results found for this basename: GLUCAP,  in the last 168 hours  Imaging Portable Chest Xray - Atelectasis  02/20/2014   CLINICAL DATA:  Atelectasis.  Aspiration.  EXAM: PORTABLE CHEST - 1 VIEW  COMPARISON:  06/07/2013.  FINDINGS: Cardiopericardial silhouette appears within normal limits. There is patchy airspace density in the RIGHT upper lobe which is new compared to prior chest radiographs. In general, differential considerations are asymmetric edema, pneumonia, or aspiration pneumonitis. Aspiration in this location is uncommon however this depends on positioning of the patient for the operation. Typically aspiration pneumonitis resolves fairly quickly and follow-up radiograph should be considered. No pneumothorax. Monitoring leads project over the chest.  IMPRESSION: Patchy RIGHT upper lobe airspace  disease which may represent aspiration depending on patient position during operation or asymmetric pulmonary edema.   Electronically Signed   By: Dereck Ligas M.D.   On: 02/20/2014 14:57     ASSESSMENT / PLAN:  PULMONARY A: Acute Hypoxic Respiratory Failure RUL Opacity - ill defined in appearance. I am concerned that there might be suprahilar fullness as well. This would be a very unusual location for aspiration.  OSA / OHS - on CPAP at home P:   CPAP QHS  Oxygen to support saturations > 92% Pulmonary Hygiene:  IS,  Mobilize Pending CXR  If no CXR resolution, consider repeat CT Chest to evaluate RUL area  PRN albuterol   CARDIOVASCULAR A:  Tachycardia - mild, post op in setting of pain  HTN - on norvasc 10, lisinopril 20 at baseline P:  Ok to transition to floor Continue norvasc Hold Lisinopril  Troponin & EKG nml 8/17  RENAL A:   Mild elevation of Sr Cr - 1.17 prior to admission, appears to be baseline ? 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 Gaynelle Arabian PRN zofran / phenergan for nausea Avoid straining / increased abdominal pressures  HEMATOLOGIC A:   No acute issues  P:  DVT Proph: SCD's   INFECTIOUS A:   RUL Airspace Opacity - doubt PNA, concern for RUL opacity.  Aspiration vs density with hilar adenopathy  Hx MRSA P:   Abx: Bactrim DS, start date 8/17 (hx MRSA, post-op coverage), day 1/x Hold abx for aspiration, hopeful to see clearing of airspace disease, pending CXR Monitor fever curve / leukocytosis   ENDOCRINE A:   Hyperglycemia - mild, carries dx of "pre-diabetes" as outpatient  P:   Monitor glucose on BMP, if greater than 180, consider adding SSI   NEUROLOGIC A:   Hx Migraine HA Anxiety  Insomnia  P:   RASS goal: n/a Continue ambien, maxalt, topamax, klonopin   TODAY'S SUMMARY: 58 y/o F admitted for posterior pelvic floor repair 8/17.  Concerns for aspiration with LMA in place & transitioned to ETT intra-op.  Extubated post procedure and had difficulty maintaining saturations.  Placed on bipap with improvement.  CXR with RUL airspace disease, pending repeat CXR am 8/18   Noe Gens, NP-C [place]  [facility] Pgr: [phone] or [phone]   02/21/2014, 8:09 AM

## 2014-02-21 NOTE — Progress Notes (Signed)
Nutrition Brief Note  Malnutrition Screening Tool result is inaccurate.  Please consult if nutrition needs are identified.  Carlis Stable MS, [place], LDN [phone] Pager [phone] Weekend/After Hours Pager

## 2014-02-21 NOTE — Progress Notes (Signed)
Urology Progress Note  1 Day Post-Op   Subjective: Post op rectocele/enterocele repair. Pt had respiratory distress intra-op, and followed post op in ICU. Now stable. Packing out, foley out. + multiple voids. C/o gluteal pain, Taking ++ percocet, and nurse notes sluggishness in responses tonight.     No acute urologic events overnight. Ambulation:  Yes Flatus:  yes  Bowel movement: no  Pain: C/o "butt pain". Taking percocet "around the clock". Pt understands that percocet will cause constipation, and that straining may tear out her sutures. She is giveen levsin and heating pad.   Objective:  Blood pressure 147/94, pulse 86, temperature 98.4 F (36.9 C), temperature source Oral, resp. rate 20, height 5\' 3"  (1.6 m), weight 104 kg (229 lb 4.5 oz), SpO2 97.00%.  Physical Exam:  General:  No acute distress, awake  Genitourinary:  Normal BUS Foley:out    I/O last 3 completed shifts: In: 3138.3 [I.V.:3138.3] Out: 1610 [Urine:4400; Blood:35]  Recent Labs     02/20/14  1630  HGB  12.5  WBC  15.9*  PLT  334    Recent Labs     02/20/14  1630  NA  139  K  4.5  CL  103  CO2  25  BUN  15  CREATININE  1.14*  CALCIUM  8.9  GFRNONAA  53*  GFRAA  62*     No results found for this basename: PT, INR, APTT,  in the last 72 hours   No components found with this basename: ABG,   Assessment/Plan: Pt presents difficulty in care: Has milk intolerance but ate ice cream night before surgery ( ? Related to CXR abnormality); and we have had multiple ( >3) direct conversations regarding constipation, and pain meds, and how constipation is her "enemy". Yet she claims tha "a doctor" told her to take the percocet "around the clock".  P: D/c planning for AM. Pt will need to prepare for less narcotic, and different ,high fiber diet.

## 2014-02-22 ENCOUNTER — Encounter (HOSPITAL_COMMUNITY): Payer: Self-pay | Admitting: Urology

## 2014-02-22 MED ORDER — TRAMADOL-ACETAMINOPHEN 37.5-325 MG PO TABS: 1.0000 | ORAL_TABLET | Freq: Four times a day (QID) | ORAL | Status: DC | PRN

## 2014-02-22 MED ORDER — MELOXICAM 15 MG PO TABS: 15.0000 mg | ORAL_TABLET | Freq: Every day | ORAL | Status: DC

## 2014-02-22 MED ORDER — TRIMETHOPRIM 100 MG PO TABS: 100.0000 mg | ORAL_TABLET | ORAL | Status: DC

## 2014-02-22 NOTE — Discharge Instructions (Signed)
Anterior and Posterior Colporrhaphy Anterior or posterior colporrhaphy is surgery to fix a prolapse of organs in the genital tract. Prolapse means the falling down, bulging, dropping, or drooping of an organ. Organs that commonly prolapse include the rectum, bladder, vagina, and uterus. Prolapse can affect a single organ or several organs at the same time. This often worsens when women stop having their monthly periods (menopause) because estrogen loss weakens the muscles and tissues in the genital tract. In addition, prolapse happens when the organs are damaged or weakened. This commonly happens after childbirth and as a result of aging. Surgery is often done for severe prolapses.  The type of colporrhaphy done depends on the type of genital prolapse. Types of genital prolapse include the following:   Cystocele. This is a prolapse of the upper (anterior) wall of the vagina. The anterior wall bulges into the vagina and brings the bladder with it.   Rectocele. This is a prolapse of the lower (posterior) wall of the vagina. The posterior vaginal wall bulges into the vagina and brings the rectum with it.   Enterocele. This is a prolapse of part of the pelvic organs called the pouch of Douglas. It also involves a portion of the small bowel. It appears as a bulge under the neck of the uterus at the top of the back wall of the vagina.   Procidentia. This is a complete prolapse of the uterus and the cervix. The prolapse can be seen and felt coming out of the vagina. LET [facility] CARE PROVIDER KNOW ABOUT:   Any allergies you have.   All medicines you are taking, including vitamins, herbs, eye drops, creams, and over-the-counter medicines.   Previous problems you or members of your family have had with the use of anesthetics.   Any blood disorders you have.   Previous surgeries you have had.   Medical conditions you have.   Smoking history or history of alcohol use.   Possibility of  pregnancy, if this applies.  RISKS AND COMPLICATIONS Generally, anterior or posterior colporrhaphy is a safe procedure. However, as with any procedure, complications can occur. Possible complications include:   Infection.   Damage to other organs during surgery.   Bleeding after surgery.   Problems urinating.   Problems from the anesthetic.  BEFORE THE PROCEDURE  Ask your health care provider about changing or stopping your regular medicines.   Do not eat or drink anything for at least 8 hours before the surgery.   If you smoke, do not smoke for at least 2 weeks before the surgery.   Make plans to have someone drive you home after your hospital stay. Also, arrange for someone to help you with activities during recovery. PROCEDURE  You may be given medicine to help you relax before the surgery (sedative). During the surgery you will be given medicine to make you sleep through the procedure (general anesthetic) or medicine to numb you from the waist down (spinal anesthetic). This medicine will be given through an intravenous (IV) access tube that is put into one of your veins.  The procedure will vary depending on the type of repair:   Anterior repair. A cut (incision) is made in the midline section of the front part of the vaginal wall. A triangular-shaped piece of vaginal tissue is removed, and the stronger, healthier tissue is sewn together in order to support and suspend the bladder.   Posterior repair. An incision is made midline on the back wall of the  vagina. A triangular portion of vaginal skin is removed to expose the muscle. Excess tissue is removed, and stronger, healthier muscle and ligament tissue is sewn together to support the rectum.   Anterior and posterior repair. Both procedures are done during the same surgery. AFTER THE PROCEDURE You will be taken to a recovery area. Your blood pressure, pulse, breathing, and temperature (vital signs) will be monitored.  This is done until you are stable. Then you will be transferred to a hospital room.  After surgery, you will have a small rubber tube in place to drain your bladder (urinary catheter). This will be in place for 2 to 7 days or until your bladder is working properly on its own. The IV access tube will be removed in 1 to 3 days. You may have a gauze packing in your vagina to prevent bleeding. This will be removed 2 or 3 days after the surgery. You will likely need to stay in the hospital for 3 to 5 days.  Document Released: 09/13/2003 Document Revised: 02/23/2013 Document Reviewed: 11/12/2012 [facility] Patient Information 2015 [place], [place]. This information is not intended to replace advice given to you by your health care provider. Make sure you discuss any questions you have with your health care provider.  Acute Respiratory Failure Respiratory failure is when your lungs are not working well and your breathing (respiratory) system fails. When respiratory failure occurs, it is difficult for your lungs to get enough oxygen, get rid of carbon dioxide, or both. Respiratory failure can be life threatening.  Respiratory failure can be acute or chronic. Acute respiratory failure is sudden, severe, and requires emergency medical treatment. Chronic respiratory failure is less severe, happens over time, and requires ongoing treatment.  WHAT ARE THE CAUSES OF ACUTE RESPIRATORY FAILURE?  Any problem affecting the heart or lungs can cause acute respiratory failure. Some of these causes include the following:  Chronic bronchitis and emphysema (COPD).   Blood clot going to a lung (pulmonary embolism).   Having water in the lungs caused by heart failure, lung injury, or infection (pulmonary edema).   Collapsed lung (pneumothorax).   Pneumonia.   Pulmonary fibrosis.   Obesity.   Asthma.   Heart failure.   Any type of trauma to the chest that can make breathing difficult.   Nerve or muscle  diseases making chest movements difficult. WHAT SYMPTOMS SHOULD YOU WATCH FOR?  If you have any of these signs or symptoms, you should seek immediate medical care:   You have shortness of breath (dyspnea) with or without activity.   You have rapid, fast breathing (tachypnea).   You are wheezing.  You are unable to say more than a few words without having to catch your breath.  You find it very difficult to function normally.  You have a fast heart rate.   You have a bluish color to your finger or toe nail beds.   You have confusion or drowsiness or both.  HOW WILL MY ACUTE RESPIRATORY FAILURE BE TREATED?  Treatment of acute respiratory failure depends on the cause of the respiratory failure. Usually, you will stay in the intensive care unit so your breathing can be watched closely. Treatment can include the following:  Oxygen. Oxygen can be delivered through the following:  Nasal cannula. This is small tubing that goes in your nose to give you oxygen.  Face mask. A face mask covers your nose and mouth to give you oxygen.  Medicine. Different medicines can be given to  help with breathing. These can include:  Nebulizers. Nebulizers deliver medicines to open the air passages (bronchodilators). These medicines help to open or relax the airways in the lungs so you can breathe better. They can also help loosen mucus from your lungs.  Diuretics. Diuretic medicines can help you breathe better by getting rid of extra water in your body.  Steroids. Steroid medicines can help decrease swelling (inflammation) in your lungs.  Antibiotics.  Chest tube. If you have a collapsed lung (pneumothorax), a chest tube is placed to help reinflate the lung.  Non-invasive positive pressure ventilation (NPPV). This is a tight-fitting mask that goes over your nose and mouth. The mask has tubing that is attached to a machine. The machine blows air into the tubing, which helps to keep the tiny air  sacs (alveoli) in your lungs open. This machine allows you to breathe on your own.  Ventilator. A ventilator is a breathing machine. When on a ventilator, a breathing tube is put into the lungs. A ventilator is used when you can no longer breathe well enough on your own. You may have low oxygen levels or high carbon dioxide (CO2) levels in your blood. When you are on a ventilator, sedation and pain medicines are given to make you sleep so your lungs can heal. Document Released: 06/28/2013 Document Revised: 11/07/2013 Document Reviewed: 06/28/2013 [facility] Patient Information 2015 [place], [place]. This information is not intended to replace advice given to you by your health care provider. Make sure you discuss any questions you have with your health care provider.

## 2014-02-22 NOTE — Care Management Note (Signed)
    Page 1 of 1   02/22/2014     11:16:27 AM CARE MANAGEMENT NOTE 02/22/2014  Patient:  Ann [name], Ann [name]   Account Number:  [id number]  Date Initiated:  02/22/2014  Documentation initiated by:  Dessa Phi  Subjective/Objective Assessment:   [address] [place].     Action/Plan:   FROM HOME.   Anticipated DC Date:  02/22/2014   Anticipated DC Plan:  [place]  CM consult      Choice offered to / List presented to:             Status of service:  Completed, signed off Medicare Important Message given?   (If response is "NO", the following Medicare IM given date fields will be blank) Date Medicare IM given:   Medicare IM given by:   Date Additional Medicare IM given:   Additional Medicare IM given by:    Discharge Disposition:  HOME/SELF CARE  Per UR Regulation:  Reviewed for med. necessity/level of care/duration of stay  If discussed at [place] of Stay Meetings, dates discussed:    Comments:  02/22/14 Gaylynn Seiple RN,BSN NCM [phone] D/C HOME NO [place].

## 2014-02-22 NOTE — Discharge Summary (Signed)
Physician Discharge Summary  Patient ID: Ann [name] MRN: [id number] DOB/AGE: Dec 18, 1955 58 y.o.  Admit date: 02/20/2014 Discharge date: 02/22/2014  Admission Diagnoses: RECTOCELE, Enterocele  Discharge Diagnoses:  Active Problems:   Rectocele, grade 3   Acute respiratory failure with hypoxia   Discharged Condition: Improved  Hospital Course:   Surgery                                 Intra-op respiratory failure 2ndary possible mucous aspiration  Consults: ICU pulmonary  Significant Diagnostic Studies: Dg Chest Port 1 View  02/21/2014   CLINICAL DATA:  Evaluate right upper lobe airspace disease.  EXAM: PORTABLE CHEST - 1 VIEW  COMPARISON:  02/20/2014  FINDINGS: Midline trachea. Normal heart size. No right and no definite left pleural effusion. No pneumothorax. Subtle airspace disease within the right upper lobe is similar. Clear left lung.  IMPRESSION: Suspicion of residual right upper lobe airspace disease, likely infection or aspiration. Recommend radiographic follow-up until clearing.   Electronically Signed   By: Abigail Miyamoto M.D.   On: 02/21/2014 08:42   Portable Chest Xray - Atelectasis  02/20/2014   CLINICAL DATA:  Atelectasis.  Aspiration.  EXAM: PORTABLE CHEST - 1 VIEW  COMPARISON:  06/07/2013.  FINDINGS: Cardiopericardial silhouette appears within normal limits. There is patchy airspace density in the RIGHT upper lobe which is new compared to prior chest radiographs. In general, differential considerations are asymmetric edema, pneumonia, or aspiration pneumonitis. Aspiration in this location is uncommon however this depends on positioning of the patient for the operation. Typically aspiration pneumonitis resolves fairly quickly and follow-up radiograph should be considered. No pneumothorax. Monitoring leads project over the chest.  IMPRESSION: Patchy RIGHT upper lobe airspace disease which may represent aspiration depending on patient position during operation or asymmetric  pulmonary edema.   Electronically Signed   By: Dereck Ligas M.D.   On: 02/20/2014 14:57    Treatments:  Post op Bi-pap until fully awake, then observation ICU step-down overnight, and 1 night on floor.   Discharge Exam: Blood pressure 141/86, pulse 93, temperature 98.3 F (36.8 C), temperature source Oral, resp. rate 18, height 5\' 3"  (1.6 m), weight 104 kg (229 lb 4.5 oz), SpO2 96.00%. General appearance: alert and cooperative Head: atraumatic Chest wall: no tenderness GI: soft, non-tender; bowel sounds normal; no masses,  no organomegaly  Disposition: 01-Home or Self Care. Resume Miralax. Radical diet change. Severely limit narcotics.  Discharge Instructions   Discharge patient    Complete by:  As directed      Discontinue IV    Complete by:  As directed             Medication List    STOP taking these medications       aspirin EC 81 MG tablet      TAKE these medications       albuterol 108 (90 BASE) MCG/ACT inhaler  Commonly known as:  PROVENTIL HFA;VENTOLIN HFA  Inhale 2 puffs into the lungs every 4 (four) hours as needed for wheezing.     amLODipine 10 MG tablet  Commonly known as:  NORVASC  Take 10 mg by mouth 2 (two) times daily.     clonazePAM 0.5 MG tablet  Commonly known as:  KLONOPIN  Take 0.5 mg by mouth 3 (three) times daily as needed for anxiety.     lisinopril 20 MG tablet  Commonly known as:  PRINIVIL,ZESTRIL  Take 20 mg by mouth at bedtime.     multivitamin with minerals Tabs tablet  Take 1 tablet by mouth daily.     omeprazole 40 MG capsule  Commonly known as:  PRILOSEC  Take 40 mg by mouth 2 (two) times daily.     polyethylene glycol packet  Commonly known as:  MIRALAX / GLYCOLAX  Take 17 g by mouth daily.     promethazine 12.5 MG tablet  Commonly known as:  PHENERGAN  Take 1 tablet (12.5 mg total) by mouth every 8 (eight) hours as needed for nausea or vomiting.     rizatriptan 10 MG disintegrating tablet  Commonly known as:   MAXALT-MLT  Take 1 tablet (10 mg total) by mouth as needed for migraine. May repeat in 2 hours if needed     topiramate 50 MG tablet  Commonly known as:  TOPAMAX  Take 50 mg by mouth 2 (two) times daily.     traMADol-acetaminophen 37.5-325 MG per tablet  Commonly known as:  ULTRACET  Take 1 tablet by mouth every 6 (six) hours as needed.     trimethoprim 100 MG tablet  Commonly known as:  TRIMPEX  Take 1 tablet (100 mg total) by mouth 1 day or 1 dose.           Follow-up Information   Follow up with Carolan Clines I, MD. (per appointment)    Specialty:  Urology   Contact information:   [place] [address] [phone]       Signed: Carolan Clines I 02/22/2014, 8:33 AM

## 2014-02-22 NOTE — Progress Notes (Signed)
Urology Progress Note  2 Days Post-Op   Subjective: Pt awake and alert. Abd benign. Chest clear. No BM, but ++ flatus.     No acute urologic events overnight. Ambulation:   positive Flatus:    positive Bowel movement  negative  Pain: some relief  Objective:  Blood pressure 141/86, pulse 93, temperature 98.3 F (36.8 C), temperature source Oral, resp. rate 18, height 5\' 3"  (1.6 m), weight 104 kg (229 lb 4.5 oz), SpO2 96.00%.  Physical Exam:  General:  No acute distress, awake Extremities: extremities normal, atraumatic, no cyanosis or edema Genitourinary:  Normal BUS. No bleeding.  Foley: out    I/O last 3 completed shifts: In: 1480 [P.O.:360; I.V.:1120] Out: 4900 [Urine:4900]  Recent Labs     02/20/14  1630  HGB  12.5  WBC  15.9*  PLT  334    Recent Labs     02/20/14  1630  NA  139  K  4.5  CL  103  CO2  25  BUN  15  CREATININE  1.14*  CALCIUM  8.9  GFRNONAA  53*  GFRAA  62*     No results found for this basename: PT, INR, APTT,  in the last 72 hours   No components found with this basename: ABG,   Assessment/Plan:   D/c today Follow up in 1 week.per appointment Stay off narcotic pain med because of constipation side effect.

## 2014-02-24 ENCOUNTER — Other Ambulatory Visit: Payer: Self-pay | Admitting: Neurology

## 2014-04-21 ENCOUNTER — Encounter (HOSPITAL_COMMUNITY): Payer: Self-pay | Admitting: General Practice

## 2014-04-21 ENCOUNTER — Inpatient Hospital Stay (HOSPITAL_COMMUNITY): Payer: Medicaid Other | Admitting: Certified Registered"

## 2014-04-21 ENCOUNTER — Encounter (HOSPITAL_COMMUNITY)
Admission: AD | Disposition: A | Discharge status: Home / Self Care | Payer: Self-pay | Source: Ambulatory Visit | Attending: Urology

## 2014-04-21 ENCOUNTER — Encounter (HOSPITAL_COMMUNITY): Payer: Medicaid Other | Admitting: Certified Registered"

## 2014-04-21 ENCOUNTER — Observation Stay (HOSPITAL_COMMUNITY)
Admission: AD | Admit: 2014-04-21 | Discharge: 2014-04-22 | Disposition: A | Discharge status: Home / Self Care | Payer: Medicaid Other | Source: Ambulatory Visit | Attending: Urology | Admitting: Urology

## 2014-04-21 ENCOUNTER — Other Ambulatory Visit: Payer: Self-pay | Admitting: Urology

## 2014-04-21 DIAGNOSIS — Z7982 Long term (current) use of aspirin: Secondary | ICD-10-CM | POA: Diagnosis not present

## 2014-04-21 DIAGNOSIS — N201 Calculus of ureter: Secondary | ICD-10-CM | POA: Diagnosis present

## 2014-04-21 DIAGNOSIS — Z8614 Personal history of Methicillin resistant Staphylococcus aureus infection: Secondary | ICD-10-CM | POA: Insufficient documentation

## 2014-04-21 DIAGNOSIS — Z8673 Personal history of transient ischemic attack (TIA), and cerebral infarction without residual deficits: Secondary | ICD-10-CM | POA: Diagnosis not present

## 2014-04-21 DIAGNOSIS — D649 Anemia, unspecified: Secondary | ICD-10-CM | POA: Insufficient documentation

## 2014-04-21 DIAGNOSIS — K449 Diaphragmatic hernia without obstruction or gangrene: Secondary | ICD-10-CM | POA: Insufficient documentation

## 2014-04-21 DIAGNOSIS — G4733 Obstructive sleep apnea (adult) (pediatric): Secondary | ICD-10-CM | POA: Diagnosis not present

## 2014-04-21 DIAGNOSIS — Z6839 Body mass index (BMI) 39.0-39.9, adult: Secondary | ICD-10-CM | POA: Insufficient documentation

## 2014-04-21 DIAGNOSIS — E785 Hyperlipidemia, unspecified: Secondary | ICD-10-CM | POA: Diagnosis not present

## 2014-04-21 DIAGNOSIS — K219 Gastro-esophageal reflux disease without esophagitis: Secondary | ICD-10-CM | POA: Insufficient documentation

## 2014-04-21 DIAGNOSIS — I10 Essential (primary) hypertension: Secondary | ICD-10-CM | POA: Insufficient documentation

## 2014-04-21 DIAGNOSIS — G8918 Other acute postprocedural pain: Secondary | ICD-10-CM | POA: Diagnosis present

## 2014-04-21 DIAGNOSIS — Z79899 Other long term (current) drug therapy: Secondary | ICD-10-CM | POA: Insufficient documentation

## 2014-04-21 DIAGNOSIS — F419 Anxiety disorder, unspecified: Secondary | ICD-10-CM | POA: Insufficient documentation

## 2014-04-21 DIAGNOSIS — N132 Hydronephrosis with renal and ureteral calculous obstruction: Secondary | ICD-10-CM | POA: Diagnosis not present

## 2014-04-21 DIAGNOSIS — E559 Vitamin D deficiency, unspecified: Secondary | ICD-10-CM | POA: Diagnosis not present

## 2014-04-21 HISTORY — PX: CYSTOSCOPY W/ URETERAL STENT PLACEMENT: SHX1429

## 2014-04-21 LAB — CBC WITH DIFFERENTIAL/PLATELET
Basophils Absolute: 0 10*3/uL (ref 0.0–0.1)
Basophils Relative: 0 % (ref 0–1)
EOS ABS: 0.2 10*3/uL (ref 0.0–0.7)
EOS PCT: 3 % (ref 0–5)
HCT: 34.5 % — ABNORMAL LOW (ref 36.0–46.0)
HEMOGLOBIN: 11.3 g/dL — AB (ref 12.0–15.0)
LYMPHS ABS: 2.1 10*3/uL (ref 0.7–4.0)
LYMPHS PCT: 35 % (ref 12–46)
MCH: 30.5 pg (ref 26.0–34.0)
MCHC: 32.8 g/dL (ref 30.0–36.0)
MCV: 93 fL (ref 78.0–100.0)
Monocytes Absolute: 0.4 10*3/uL (ref 0.1–1.0)
Monocytes Relative: 6 % (ref 3–12)
Neutro Abs: 3.4 10*3/uL (ref 1.7–7.7)
Neutrophils Relative %: 56 % (ref 43–77)
Platelets: 396 10*3/uL (ref 150–400)
RBC: 3.71 MIL/uL — AB (ref 3.87–5.11)
RDW: 13.4 % (ref 11.5–15.5)
WBC: 6 10*3/uL (ref 4.0–10.5)

## 2014-04-21 LAB — BASIC METABOLIC PANEL
Anion gap: 15 (ref 5–15)
BUN: 18 mg/dL (ref 6–23)
CALCIUM: 8.8 mg/dL (ref 8.4–10.5)
CO2: 21 meq/L (ref 19–32)
Chloride: 106 mEq/L (ref 96–112)
Creatinine, Ser: 1.02 mg/dL (ref 0.50–1.10)
GFR calc Af Amer: 70 mL/min — ABNORMAL LOW (ref >90)
GFR calc non Af Amer: 61 mL/min — ABNORMAL LOW (ref >90)
GLUCOSE: 92 mg/dL (ref 70–99)
Potassium: 3.6 mEq/L — ABNORMAL LOW (ref 3.7–5.3)
Sodium: 142 mEq/L (ref 137–147)

## 2014-04-21 SURGERY — CYSTOSCOPY, WITH RETROGRADE PYELOGRAM AND URETERAL STENT INSERTION
Anesthesia: General | Site: Ureter | Laterality: Right

## 2014-04-21 MED ORDER — MAGNESIUM CITRATE PO SOLN: 1.0000 | Freq: Once | ORAL | Status: AC | PRN

## 2014-04-21 MED ORDER — PROPOFOL 10 MG/ML IV BOLUS
INTRAVENOUS | Status: DC | PRN
  Administered 2014-04-21: 175 mg via INTRAVENOUS
  Administered 2014-04-21: 25 mg via INTRAVENOUS

## 2014-04-21 MED ORDER — FUROSEMIDE 20 MG PO TABS: 20.0000 mg | ORAL_TABLET | ORAL | Status: DC | PRN

## 2014-04-21 MED ORDER — MIDAZOLAM HCL 2 MG/2ML IJ SOLN
INTRAMUSCULAR | Status: AC
  Filled 2014-04-21: qty 2

## 2014-04-21 MED ORDER — DEXAMETHASONE SODIUM PHOSPHATE 10 MG/ML IJ SOLN
INTRAMUSCULAR | Status: DC | PRN
  Administered 2014-04-21: 10 mg via INTRAVENOUS

## 2014-04-21 MED ORDER — ZOLPIDEM TARTRATE 5 MG PO TABS: 5.0000 mg | ORAL_TABLET | Freq: Every evening | ORAL | Status: DC | PRN

## 2014-04-21 MED ORDER — FENTANYL CITRATE 0.05 MG/ML IJ SOLN: 25.0000 ug | INTRAMUSCULAR | Status: DC | PRN

## 2014-04-21 MED ORDER — KETOROLAC TROMETHAMINE 30 MG/ML IJ SOLN
INTRAMUSCULAR | Status: DC | PRN
  Administered 2014-04-21: 30 mg via INTRAVENOUS

## 2014-04-21 MED ORDER — POLYETHYLENE GLYCOL 3350 17 G PO PACK
17.0000 g | PACK | Freq: Every day | ORAL | Status: DC
  Administered 2014-04-22: 17 g via ORAL
  Filled 2014-04-21: qty 1

## 2014-04-21 MED ORDER — PROPOFOL 10 MG/ML IV BOLUS
INTRAVENOUS | Status: AC
  Filled 2014-04-21: qty 20

## 2014-04-21 MED ORDER — ADULT MULTIVITAMIN W/MINERALS CH
1.0000 | ORAL_TABLET | Freq: Every day | ORAL | Status: DC
  Administered 2014-04-22: 1 via ORAL
  Filled 2014-04-21: qty 1

## 2014-04-21 MED ORDER — ASPIRIN EC 81 MG PO TBEC
81.0000 mg | DELAYED_RELEASE_TABLET | Freq: Every day | ORAL | Status: DC
  Administered 2014-04-22: 81 mg via ORAL
  Filled 2014-04-21: qty 1

## 2014-04-21 MED ORDER — METOCLOPRAMIDE HCL 5 MG/ML IJ SOLN: 10.0000 mg | Freq: Once | INTRAMUSCULAR | Status: DC | PRN

## 2014-04-21 MED ORDER — SODIUM CHLORIDE 0.45 % IV SOLN
INTRAVENOUS | Status: DC
  Administered 2014-04-21: 22:00:00 via INTRAVENOUS

## 2014-04-21 MED ORDER — CEFAZOLIN SODIUM-DEXTROSE 2-3 GM-% IV SOLR
INTRAVENOUS | Status: AC
  Filled 2014-04-21: qty 50

## 2014-04-21 MED ORDER — ALBUTEROL SULFATE HFA 108 (90 BASE) MCG/ACT IN AERS: 2.0000 | INHALATION_SPRAY | RESPIRATORY_TRACT | Status: DC | PRN

## 2014-04-21 MED ORDER — KETOROLAC TROMETHAMINE 15 MG/ML IJ SOLN
INTRAMUSCULAR | Status: AC
  Filled 2014-04-21: qty 1

## 2014-04-21 MED ORDER — CLONAZEPAM 0.5 MG PO TABS
0.5000 mg | ORAL_TABLET | Freq: Three times a day (TID) | ORAL | Status: DC | PRN
Start: 2014-04-21 — End: 2014-04-22
  Administered 2014-04-22: 0.5 mg via ORAL
  Filled 2014-04-21: qty 1

## 2014-04-21 MED ORDER — LISINOPRIL 20 MG PO TABS
20.0000 mg | ORAL_TABLET | Freq: Every day | ORAL | Status: DC
  Administered 2014-04-22: 20 mg via ORAL
  Filled 2014-04-21: qty 1

## 2014-04-21 MED ORDER — FENTANYL CITRATE 0.05 MG/ML IJ SOLN
INTRAMUSCULAR | Status: DC | PRN
  Administered 2014-04-21: 50 ug via INTRAVENOUS

## 2014-04-21 MED ORDER — KETOROLAC TROMETHAMINE 15 MG/ML IJ SOLN
15.0000 mg | Freq: Once | INTRAMUSCULAR | Status: AC | PRN
  Administered 2014-04-21: 15 mg via INTRAVENOUS

## 2014-04-21 MED ORDER — SUCCINYLCHOLINE CHLORIDE 20 MG/ML IJ SOLN
INTRAMUSCULAR | Status: DC | PRN
  Administered 2014-04-21: 120 mg via INTRAVENOUS

## 2014-04-21 MED ORDER — AMLODIPINE BESYLATE 10 MG PO TABS
10.0000 mg | ORAL_TABLET | Freq: Two times a day (BID) | ORAL | Status: DC
  Administered 2014-04-22 (×2): 10 mg via ORAL
  Filled 2014-04-21 (×2): qty 1

## 2014-04-21 MED ORDER — ACETAMINOPHEN 10 MG/ML IV SOLN
1000.0000 mg | Freq: Once | INTRAVENOUS | Status: AC
  Administered 2014-04-21: 1000 mg via INTRAVENOUS
  Filled 2014-04-21: qty 100

## 2014-04-21 MED ORDER — IOHEXOL 300 MG/ML  SOLN
INTRAMUSCULAR | Status: DC | PRN
  Administered 2014-04-21: 20 mL via URETHRAL

## 2014-04-21 MED ORDER — FENTANYL CITRATE 0.05 MG/ML IJ SOLN
INTRAMUSCULAR | Status: AC
  Filled 2014-04-21: qty 5

## 2014-04-21 MED ORDER — MIDAZOLAM HCL 5 MG/5ML IJ SOLN
INTRAMUSCULAR | Status: DC | PRN
  Administered 2014-04-21: 1 mg via INTRAVENOUS

## 2014-04-21 MED ORDER — ACETAMINOPHEN 10 MG/ML IV SOLN: 1000.0000 mg | Freq: Once | INTRAVENOUS | Status: DC

## 2014-04-21 MED ORDER — LIDOCAINE HCL (CARDIAC) 20 MG/ML IV SOLN
INTRAVENOUS | Status: DC | PRN
  Administered 2014-04-21: 100 mg via INTRAVENOUS

## 2014-04-21 MED ORDER — SENNA 8.6 MG PO TABS
1.0000 | ORAL_TABLET | Freq: Two times a day (BID) | ORAL | Status: DC
  Administered 2014-04-22 (×2): 8.6 mg via ORAL
  Filled 2014-04-21 (×2): qty 1

## 2014-04-21 MED ORDER — TOPIRAMATE 25 MG PO TABS
50.0000 mg | ORAL_TABLET | Freq: Two times a day (BID) | ORAL | Status: DC
  Administered 2014-04-22 (×2): 50 mg via ORAL
  Filled 2014-04-21 (×2): qty 2

## 2014-04-21 MED ORDER — SODIUM CHLORIDE 0.9 % IR SOLN
Status: DC | PRN
  Administered 2014-04-21: 3000 mL

## 2014-04-21 MED ORDER — HYDROMORPHONE HCL 1 MG/ML IJ SOLN: 0.5000 mg | INTRAMUSCULAR | Status: DC | PRN

## 2014-04-21 MED ORDER — 0.9 % SODIUM CHLORIDE (POUR BTL) OPTIME
TOPICAL | Status: DC | PRN
  Administered 2014-04-21: 1000 mL

## 2014-04-21 MED ORDER — MEPERIDINE HCL 50 MG/ML IJ SOLN: 6.2500 mg | INTRAMUSCULAR | Status: DC | PRN

## 2014-04-21 MED ORDER — CIPROFLOXACIN HCL 500 MG PO TABS
500.0000 mg | ORAL_TABLET | Freq: Two times a day (BID) | ORAL | Status: DC
Start: 2014-04-21 — End: 2014-04-22
  Administered 2014-04-22 (×2): 500 mg via ORAL
  Filled 2014-04-21 (×2): qty 1

## 2014-04-21 MED ORDER — SUMATRIPTAN SUCCINATE 50 MG PO TABS
50.0000 mg | ORAL_TABLET | ORAL | Status: DC | PRN
  Filled 2014-04-21: qty 1

## 2014-04-21 MED ORDER — ONDANSETRON HCL 4 MG/2ML IJ SOLN
INTRAMUSCULAR | Status: DC | PRN
  Administered 2014-04-21: 4 mg via INTRAVENOUS

## 2014-04-21 MED ORDER — ALBUTEROL SULFATE (2.5 MG/3ML) 0.083% IN NEBU: 2.5000 mg | INHALATION_SOLUTION | RESPIRATORY_TRACT | Status: DC | PRN

## 2014-04-21 MED ORDER — OXYBUTYNIN CHLORIDE 5 MG PO TABS
5.0000 mg | ORAL_TABLET | Freq: Three times a day (TID) | ORAL | Status: DC | PRN
  Administered 2014-04-22: 5 mg via ORAL
  Filled 2014-04-21: qty 1

## 2014-04-21 MED ORDER — CEFAZOLIN SODIUM-DEXTROSE 2-3 GM-% IV SOLR
2.0000 g | INTRAVENOUS | Status: AC
  Administered 2014-04-21: 2 g via INTRAVENOUS

## 2014-04-21 MED ORDER — PANTOPRAZOLE SODIUM 40 MG PO TBEC
40.0000 mg | DELAYED_RELEASE_TABLET | Freq: Every day | ORAL | Status: DC
  Administered 2014-04-22: 40 mg via ORAL
  Filled 2014-04-21: qty 1

## 2014-04-21 MED ORDER — PROMETHAZINE HCL 25 MG PO TABS: 12.5000 mg | ORAL_TABLET | Freq: Three times a day (TID) | ORAL | Status: DC | PRN

## 2014-04-21 MED ORDER — ONDANSETRON HCL 4 MG/2ML IJ SOLN: 4.0000 mg | INTRAMUSCULAR | Status: DC | PRN

## 2014-04-21 MED ORDER — DIPHENHYDRAMINE HCL 50 MG/ML IJ SOLN: 12.5000 mg | Freq: Four times a day (QID) | INTRAMUSCULAR | Status: DC | PRN

## 2014-04-21 MED ORDER — LACTATED RINGERS IV SOLN
INTRAVENOUS | Status: DC | PRN
  Administered 2014-04-21: 20:00:00 via INTRAVENOUS

## 2014-04-21 MED ORDER — OXYCODONE-ACETAMINOPHEN 5-325 MG PO TABS: 1.0000 | ORAL_TABLET | ORAL | Status: DC | PRN

## 2014-04-21 MED ORDER — DIPHENHYDRAMINE HCL 12.5 MG/5ML PO ELIX: 12.5000 mg | ORAL_SOLUTION | Freq: Four times a day (QID) | ORAL | Status: DC | PRN

## 2014-04-21 MED ORDER — METOCLOPRAMIDE HCL 5 MG/ML IJ SOLN
INTRAMUSCULAR | Status: DC | PRN
  Administered 2014-04-21: 5 mg via INTRAVENOUS

## 2014-04-21 MED ORDER — BISACODYL 10 MG RE SUPP: 10.0000 mg | Freq: Every day | RECTAL | Status: DC | PRN

## 2014-04-21 SURGICAL SUPPLY — 16 items
BAG URO CATCHER STRL LF (DRAPE) ×3 IMPLANT
CATH INTERMIT  6FR 70CM (CATHETERS) ×3 IMPLANT
CLOTH BEACON ORANGE TIMEOUT ST (SAFETY) ×3 IMPLANT
DRAPE CAMERA CLOSED 9X96 (DRAPES) ×3 IMPLANT
GLOVE BIOGEL M STRL SZ7.5 (GLOVE) ×3 IMPLANT
GLOVE SURG SS PI 7.5 STRL IVOR (GLOVE) ×6 IMPLANT
GOWN STRL REUS W/TWL XL LVL3 (GOWN DISPOSABLE) ×9 IMPLANT
GUIDEWIRE STR DUAL SENSOR (WIRE) ×3 IMPLANT
IV NS IRRIG 3000ML ARTHROMATIC (IV SOLUTION) ×3 IMPLANT
MANIFOLD NEPTUNE II (INSTRUMENTS) ×3 IMPLANT
NS IRRIG 1000ML POUR BTL (IV SOLUTION) ×3 IMPLANT
PACK CYSTO (CUSTOM PROCEDURE TRAY) ×3 IMPLANT
SCRUB PCMX 4 OZ (MISCELLANEOUS) ×3 IMPLANT
STENT CONTOUR 6FRX24X.038 (STENTS) ×3 IMPLANT
TUBING CONNECTING 10 (TUBING) ×2 IMPLANT
TUBING CONNECTING 10' (TUBING) ×1

## 2014-04-21 NOTE — H&P (Signed)
Reason For Visit Low urine output   Active Problems Problems  1. Ureteral calculus (N20.1)   Assessed By: Rana Snare (Urology); Last Assessed: 20 Apr 2014  History of Present Illness    58 yo female presents today because of low urine output. She was seen by Dr. Risa Grill yesterday as as an acute work and with a newly diagnosed large right ureteral stone and hydronephrosis. Patient is currently recovering from rectocele and enterocele surgery by Dr. Gaynelle Arabian. Apparently that surgery was complicated by some anesthetic complications with a question of aspiration. She has no prior history of nephrolithiasis. She presented with significant right flank pain. She was seen recently at [facility] family medicine. At that time Y blood cell count was normal. Creatinine also fairly normal at 1.1. Urinalysis showed leukocytes and a trace of blood. The patient subsequently had a CT performed of the abdomen and pelvis without contrast today. This showed a 10 x 7 mm stone in the right mid ureter. There is significant hydronephrosis noted. The patient states that she has had chills at night. She reports that this is been going on for several days. Yesterday she was started on ciprofloxacin. She has had no dysuria. She has had ongoing flank pain but it is relatively mild at this point. She comes in in no apparent distress. She is currently afebrile. Urinalysis does show significant pyuria but she has had that on a chronic basis on every urinalysis she has had here and has had 3 cultures all of which have been negative. I suspect her chronically abnormal urinalysis may have been on the basis of some urinary tract inflammation from this large stone. It's difficult to know how long this stone is actually been in her ureter. KUB imaging today a faint 8 x 10 mm stone is noted just above pelvis.   Past Medical History Problems  1. History of Anxiety (F41.9) 2. History of arthritis (Z87.39) 3. History of  gastroesophageal reflux (GERD) (Z87.19) 4. History of hiatal hernia (Z87.19) 5. History of hyperlipidemia (Z86.39) 6. History of hypertension (Z86.79) 7. History of methicillin resistant Staphylococcus aureus infection (Z86.14) 8. History of neuropathy (Z86.69) 9. History of stroke (Z86.73) 10. History of Obstructive sleep apnea, adult (G47.33) 11. History of Vitamin D deficiency (E55.9)  Surgical History Problems  1. History of Anterior Colporrhaphy, Repair Of Cystocele 2. History of Enterocele Repair, Vag. Approach (Sep Procedure) 3. History of Esophageal Dilation 4. History of Knee Arthroscopy 5. History of Mid-Urethral Sling Operation 6. History of Posterior Colporrhaphy (For Pelvic Relaxation) 7. History of Sacrospinous Ligament Fixation For Posthysterectomy  Prolapse 8. History of Tubal Ligation 9. History of Vaginal Surg Insertion Of Prosthesis For Pelvic Floor Repair  Current Meds 1. AmLODIPine Besylate 10 MG Oral Tablet;  Therapy: (Recorded:27Oct2014) to Recorded 2. Aspirin 81 MG Oral Tablet;  Therapy: (Recorded:27Oct2014) to Recorded 3. Cipro TABS;  Therapy: (Recorded:15Oct2015) to Recorded 4. ClonazePAM TABS;  Therapy: (Recorded:15Oct2015) to Recorded 5. Furosemide TABS;  Therapy: (Recorded:15Oct2015) to Recorded 6. Nortriptyline HCl - 10 MG Oral Capsule;  Therapy: (Recorded:27Oct2014) to Recorded 7. Ondansetron HCl - 4 MG Oral Tablet; TAKE 1 TABLET Every 6 hours PRN;  Therapy: [phone] to (Last Rx:15Oct2015)  Requested for: 15Oct2015  Ordered 8. Polyethylene Glycol 3350 Oral Powder; MIX ONE CAPFUL IN 8 OUNCES  OF WATER, JUICE OR TEA AND DRINK DAILY;  Therapy: 03Sep2015 to (Last Rx:03Sep2015)  Requested for: 08Sep2015  Ordered 9. Prevacid 24HR 15 MG Oral Capsule Delayed Release;  Therapy: (Recorded:15Oct2015) to Recorded 10. Senokot  TABS;   Therapy: (Recorded:25Aug2015) to Recorded 11. Topiramate 50 MG Oral Tablet;   Therapy: (Recorded:27Oct2014) to  Recorded  Allergies Medication  1. No Known Drug Allergies  Family History Problems  1. Family history of Deceased : Father, Mother 2. Family history of cardiac disorder (Z82.49) : Mother 3. Family history of malignant neoplasm (Z80.9) : Father 4. Family history of tuberculosis (Z65.1)  Social History Problems  1. Alcohol use 2. Caffeine use (F15.90)   1 per day 3. Married 4. Never smoker 5. No alcohol use 6. Number of children   1 son, 2 daughters  Review of Systems Genitourinary, constitutional, skin, eye, otolaryngeal, hematologic/lymphatic, cardiovascular, pulmonary, endocrine, musculoskeletal, gastrointestinal, neurological and psychiatric system(s) were reviewed and pertinent findings if present are noted.  Genitourinary: no hematuria.  Gastrointestinal: nausea, flank pain and abdominal pain.  Constitutional: night sweats and feeling tired (fatigue).    Vitals Vital Signs [Data Includes: Last 1 Day]  Recorded: 16Oct2015 03:10PM  Blood Pressure: 138 / 86 Temperature: 97.3 F Heart Rate: 75 Recorded: 15Oct2015 01:46PM  Blood Pressure: 148 / 88 Temperature: 97.8 F Heart Rate: 80  Physical Exam Constitutional: Well nourished and well developed . No acute distress.  ENT:. The ears and nose are normal in appearance.  Neck: The appearance of the neck is normal and no neck mass is present.  Pulmonary: No respiratory distress . No accessory muscle use.  Cardiovascular:. No peripheral edema.  Abdomen: The abdomen is obese, but not distended. The abdomen is soft and nontender. No masses are palpated. No CVA tenderness. Bowel sounds are normal. No hernias are palpable. No hepatosplenomegaly noted.  Lymphatics: The femoral and inguinal nodes are not enlarged or tender.  Skin: Normal skin turgor, no visible rash and no visible skin lesions.  Neuro/Psych:. Mood and affect are appropriate.    Results/Data  Urine [Data Includes: Last 1 Day]   16Oct2015  COLOR YELLOW    APPEARANCE CLEAR   SPECIFIC GRAVITY 1.010   pH 6.0   GLUCOSE NEG mg/dL  BILIRUBIN NEG   KETONE NEG mg/dL  BLOOD MOD   PROTEIN NEG mg/dL  UROBILINOGEN 0.2 mg/dL  NITRITE NEG   LEUKOCYTE ESTERASE LARGE   SQUAMOUS EPITHELIAL/HPF NONE SEEN   WBC 11-20 WBC/hpf  RBC 3-6 RBC/hpf  BACTERIA FEW   CRYSTALS NONE SEEN   CASTS NONE SEEN    PVR: Ultrasound PVR 23 ml.  21 Apr 2014 2:50 PM  UA With REFLEX    COLOR YELLOW     APPEARANCE CLEAR     SPECIFIC GRAVITY 1.010     pH 6.0     GLUCOSE NEG     BILIRUBIN NEG     KETONE NEG     BLOOD MOD     PROTEIN NEG     UROBILINOGEN 0.2     NITRITE NEG     LEUKOCYTE ESTERASE LARGE     SQUAMOUS EPITHELIAL/HPF NONE SEEN     WBC 11-20     CRYSTALS NONE SEEN     CASTS NONE SEEN     RBC 3-6     BACTERIA FEW     Assessment Assessed  1. Calculus of right ureter (N20.1) 2. Hydronephrosis, right (N13.30) 3. Nephrolithiasis (N20.0)  Pt dehydrated. Needs KUB. She will need cysto, R retrograde pyelogram and Right JJ stent.   Plan Calculus of right ureter, Hydronephrosis, right, Ureteral calculus  1. KUB; Status:Canceled - Appointment,Date of Service;  Incomplete bladder emptying  2. PVR U/S; Status:Complete;   Done:  [phone]  Right stent tonight.   Probqble admision for IV hydration.   Signatures Electronically signed by : Carolan Clines, M.D.; Apr 21 2014  5:04PM EST

## 2014-04-21 NOTE — Anesthesia Postprocedure Evaluation (Signed)
  Anesthesia Post-op Note  Anesthesia Post Note  Patient: Ann [name]  Procedure(s) Performed: Procedure(s) (LRB): CYSTOSCOPY WITH RETROGRADE PYELOGRAM/URETERAL STENT PLACEMENT (Right)  Anesthesia type: General  Patient location: PACU  Post pain: Pain level controlled.  Denies pain.  Post assessment: Post-op Vital signs reviewed  Last Vitals:  Filed Vitals:   04/21/14 2200  BP:   Pulse:   Temp: 36.8 C  Resp:   HR 72 Sat 95-98% on RA. RR 16 BP 130/82  Post vital signs: Reviewed  Level of consciousness: awake, conversing with family.  CPAP off.  Complications: No apparent anesthesia complications.  Pt reports she feels "the best I ever have after surgery"

## 2014-04-21 NOTE — Anesthesia Procedure Notes (Signed)
Procedure Name: Intubation Date/Time: 04/21/2014 8:20 PM Performed by: Ofilia Neas Pre-anesthesia Checklist: Patient identified, Emergency Drugs available, Suction available, Patient being monitored and Timeout performed Patient Re-evaluated:Patient Re-evaluated prior to inductionOxygen Delivery Method: Circle system utilized Preoxygenation: Pre-oxygenation with 100% oxygen Intubation Type: IV induction Ventilation: Mask ventilation without difficulty Laryngoscope Size: Mac and 4 Grade View: Grade II Tube type: Oral Tube size: 7.5 mm Number of attempts: 1 Airway Equipment and Method: Stylet Placement Confirmation: ETT inserted through vocal cords under direct vision,  positive ETCO2 and CO2 detector Secured at: 21 cm Tube secured with: Tape Dental Injury: Teeth and Oropharynx as per pre-operative assessment

## 2014-04-21 NOTE — Transfer of Care (Signed)
Immediate Anesthesia Transfer of Care Note  Patient: Ann [name]  Procedure(s) Performed: Procedure(s): CYSTOSCOPY WITH RETROGRADE PYELOGRAM/URETERAL STENT PLACEMENT (Right)  Patient Location: PACU  Anesthesia Type:General  Level of Consciousness: awake, alert , oriented, patient cooperative and responds to stimulation  Airway & Oxygen Therapy: Patient Spontanous Breathing and Patient connected to face mask oxygen  Post-op Assessment: Report given to PACU RN, Post -op Vital signs reviewed and stable and Patient moving all extremities  Post vital signs: Reviewed and stable  Complications: No apparent anesthesia complications

## 2014-04-21 NOTE — Interval H&P Note (Signed)
History and Physical Interval Note:  04/21/2014 7:20 PM  Ann [name]  has presented today for surgery, with the diagnosis of Right ureteral stone  The various methods of treatment have been discussed with the patient and family. After consideration of risks, benefits and other options for treatment, the patient has consented to  Procedure(s): CYSTOSCOPY WITH RETROGRADE PYELOGRAM/URETERAL STENT PLACEMENT (Right) as a surgical intervention .  The patient's history has been reviewed, patient examined, no change in status, stable for surgery.  I have reviewed the patient's chart and labs.  Questions were answered to the patient's satisfaction.     Carolan Clines I

## 2014-04-21 NOTE — Interval H&P Note (Signed)
History and Physical Interval Note:  04/21/2014 7:21 PM  Ann [name]  has presented today for surgery, with the diagnosis of Right ureteral stone  The various methods of treatment have been discussed with the patient and family. After consideration of risks, benefits and other options for treatment, the patient has consented to  Procedure(s): CYSTOSCOPY WITH RETROGRADE PYELOGRAM/URETERAL STENT PLACEMENT (Right) as a surgical intervention .  The patient's history has been reviewed, patient examined, no change in status, stable for surgery.  I have reviewed the patient's chart and labs.  Questions were answered to the patient's satisfaction.     Carolan Clines I

## 2014-04-21 NOTE — Op Note (Signed)
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 Gaynelle Arabian, MD  First assistant:  none  Anesthesia:  GET  Preparation: After appropriate preanesthesia, the patient was brought to the operating room, placed on the operating table in the dorsal supine position where general endotracheal anesthesia was introduced. She was then replaced in the dorsal lithotomy position with pubis was prepped with Betadine solution and draped in usual fashion. The right arm was previously marked. History was reviewed. X-rays were reviewed.  Review history:  Problems  1. Ureteral calculus (N20.1)   Assessed By: Rana Snare (Urology); Last Assessed: 20 Apr 2014  History of Present Illness  58 yo female presents today because of low urine output. She was seen by Dr. Risa Grill yesterday as as an acute work and with a newly diagnosed large right ureteral stone and hydronephrosis. Patient is currently recovering from rectocele and enterocele surgery by Dr. Gaynelle Arabian. Apparently that surgery was complicated by some anesthetic complications with a question of aspiration. She has no prior history of nephrolithiasis. She presented with significant right flank pain. She was seen recently at [facility] family medicine. At that time Y blood cell count was normal. Creatinine also fairly normal at 1.1. Urinalysis showed leukocytes and a trace of blood. The patient subsequently had a CT performed of the abdomen and pelvis without contrast today. This showed a 10 x 7 mm stone in the right mid ureter. There is significant hydronephrosis noted. The patient states that she has had chills at night. She reports that this is been going on for several days. Yesterday she was started on ciprofloxacin. She has had no dysuria. She has had ongoing flank pain but it is  relatively mild at this point. She comes in in no apparent distress. She is currently afebrile. Urinalysis does show significant pyuria but she has had that on a chronic basis on every urinalysis she has had here and has had 3 cultures all of which have been negative. I suspect her chronically abnormal urinalysis may have been on the basis of some urinary tract inflammation from this large stone. It's difficult to know how long this stone is actually been in her ureter. KUB imaging today a faint 8 x 10 mm stone is noted just above pelvis.  Past Medical History   Statement of  Likelihood of Success: Excellent. TIME-OUT observed.:  Procedure:  Cystourethroscopy was helps, and the bladder appeared normal. The ureteral orifices were identified on the trigone. Right retropyelogram was performed, which showed a hydronephrotic right ureter, with obstruction of the mid ureter. The stone was poorly visualized, however. Marked hydronephrosis was noted above the level of stone, into a markedly dilated renal pelvis. A guidewire was passed and coiled into the renal pelvis, and over this, a 6 [place] by 24 cm double-J stent was placed. It was coiled in the bladder. After x-ray confirmation, the bladder is drained of fluid. The patient was awakened and taken to recovery room in good condition. Because of her difficulties with narcotics and breathing, and I elected to not give her B. and O. suppository.

## 2014-04-21 NOTE — Anesthesia Preprocedure Evaluation (Addendum)
Anesthesia Evaluation  Patient identified by MRN, date of birth, ID band Patient awake    Reviewed: Allergy & Precautions, H&P , NPO status , Patient's Chart, lab work & pertinent test results, reviewed documented beta blocker date and time   History of Anesthesia Complications (+) history of anesthetic complications (possible aspiration during last surgery 8/15, hospitalized after for respiratory depression thought  to be due to OSA/narcs.  Has fear of anesthesia due to sister who died after GA from complications related to undiagnosed OSA and MO)  Airway Mallampati: II TM Distance: >3 FB Neck ROM: full    Dental  (+) Teeth Intact   Pulmonary sleep apnea (inconsistent CPAP use - required BiPAP after last surgery) and Continuous Positive Airway Pressure Ventilation , pneumonia - (every 2-5 years, hospitalized),  Persistent cough x 15 years breath sounds clear to auscultation  Pulmonary exam normal       Cardiovascular hypertension (was off BP meds for 2 weeks and has been back on for 3 days), On Medications Rhythm:regular Rate:Normal     Neuro/Psych  Headaches, Anxiety TIA   GI/Hepatic Neg liver ROS, hiatal hernia, GERD- (takes prilosec, sleeps elevated, "chews tums all night"  Persistent cough and frequent pneumonia)  Medicated and Poorly Controlled,  Endo/Other  Morbid obesity (BMI 39)  Renal/GU Current kidney stone     Musculoskeletal   Abdominal   Peds  Hematology  (+) anemia ,   Anesthesia Other Findings Last ate 6 pm yesterday  Reproductive/Obstetrics negative OB ROS                         Anesthesia Physical Anesthesia Plan  ASA: III  Anesthesia Plan: General ETT   Post-op Pain Management:    Induction: Rapid sequence and Intravenous  Airway Management Planned: Oral ETT  Additional Equipment:   Intra-op Plan:   Post-operative Plan:   Informed Consent: I have reviewed the  patients History and Physical, chart, labs and discussed the procedure including the risks, benefits and alternatives for the proposed anesthesia with the patient or authorized representative who has indicated his/her understanding and acceptance.   Dental Advisory Given  Plan Discussed with: CRNA and Surgeon  Anesthesia Plan Comments: (1) History of possible aspiration episode with LMA, and uncontrolled GERD.  Plan rapid sequence induction and ETT.   2) History of respiratory depression/respiratory failure after surgery requiring step-down, BiPAP/CPAP.  May be related to OSA and narcotic-related respiratory depression.  Plan is to minimize narcotics, use toradol & Ofirmev, use OSA Post-Op Protocol, CPAP immediately on arrival to PACU.)       Anesthesia Quick Evaluation

## 2014-04-22 DIAGNOSIS — N132 Hydronephrosis with renal and ureteral calculous obstruction: Secondary | ICD-10-CM | POA: Diagnosis not present

## 2014-04-22 LAB — CBC
HCT: 36.1 % (ref 36.0–46.0)
HEMOGLOBIN: 11.5 g/dL — AB (ref 12.0–15.0)
MCH: 30.4 pg (ref 26.0–34.0)
MCHC: 31.9 g/dL (ref 30.0–36.0)
MCV: 95.5 fL (ref 78.0–100.0)
Platelets: 372 10*3/uL (ref 150–400)
RBC: 3.78 MIL/uL — AB (ref 3.87–5.11)
RDW: 13.4 % (ref 11.5–15.5)
WBC: 5.6 10*3/uL (ref 4.0–10.5)

## 2014-04-22 LAB — BASIC METABOLIC PANEL
Anion gap: 15 (ref 5–15)
BUN: 18 mg/dL (ref 6–23)
CO2: 20 mEq/L (ref 19–32)
Calcium: 8.9 mg/dL (ref 8.4–10.5)
Chloride: 104 mEq/L (ref 96–112)
Creatinine, Ser: 1.07 mg/dL (ref 0.50–1.10)
GFR calc Af Amer: 66 mL/min — ABNORMAL LOW (ref >90)
GFR calc non Af Amer: 57 mL/min — ABNORMAL LOW (ref >90)
GLUCOSE: 171 mg/dL — AB (ref 70–99)
POTASSIUM: 4.4 meq/L (ref 3.7–5.3)
Sodium: 139 mEq/L (ref 137–147)

## 2014-04-22 MED ORDER — KETOROLAC TROMETHAMINE 30 MG/ML IJ SOLN: 30.0000 mg | Freq: Once | INTRAMUSCULAR | Status: DC

## 2014-04-22 MED ORDER — HEPARIN SODIUM (PORCINE) 5000 UNIT/ML IJ SOLN
5000.0000 [IU] | Freq: Three times a day (TID) | INTRAMUSCULAR | Status: DC
  Filled 2014-04-22: qty 1

## 2014-04-22 MED ORDER — TRAMADOL-ACETAMINOPHEN 37.5-325 MG PO TABS: 1.0000 | ORAL_TABLET | Freq: Four times a day (QID) | ORAL | Status: DC | PRN

## 2014-04-22 MED ORDER — TRIMETHOPRIM 100 MG PO TABS: 100.0000 mg | ORAL_TABLET | ORAL | Status: DC

## 2014-04-22 MED ORDER — PHENAZOPYRIDINE HCL 200 MG PO TABS
200.0000 mg | ORAL_TABLET | Freq: Three times a day (TID) | ORAL | Status: DC
  Administered 2014-04-22: 200 mg via ORAL
  Filled 2014-04-22 (×2): qty 1

## 2014-04-22 MED ORDER — PHENAZOPYRIDINE HCL 200 MG PO TABS: 200.0000 mg | ORAL_TABLET | Freq: Three times a day (TID) | ORAL | Status: DC | PRN

## 2014-04-22 MED ORDER — KETOROLAC TROMETHAMINE 30 MG/ML IJ SOLN
30.0000 mg | Freq: Once | INTRAMUSCULAR | Status: AC
  Administered 2014-04-22: 30 mg via INTRAVENOUS
  Filled 2014-04-22: qty 1

## 2014-04-22 MED ORDER — INFLUENZA VAC SPLIT QUAD 0.5 ML IM SUSY
0.5000 mL | PREFILLED_SYRINGE | INTRAMUSCULAR | Status: AC
  Administered 2014-04-22: 0.5 mL via INTRAMUSCULAR
  Filled 2014-04-22 (×2): qty 0.5

## 2014-04-22 NOTE — Discharge Summary (Signed)
Physician Discharge Summary  Patient ID: Ann [name] MRN: [id number] DOB/AGE: Sep 25, 1955 58 y.o.  Admit date: 04/21/2014 Discharge date: 04/22/2014  Admission Diagnoses: Right ureteral stone  Discharge Diagnoses:  Active Problems:   Post-op pain   Ureteral stone with hydronephrosis   Discharged Condition: good  Hospital Course: surgery JJ stent  Consults: none  Significant Diagnostic Studies: No results found.  Treatments: {surgery  Discharge Exam: Blood pressure 125/62, pulse 67, temperature 97.8 F (36.6 C), temperature source Oral, resp. rate 20, height 5' 2.5" (1.588 m), weight 99.247 kg (218 lb 12.8 oz), SpO2 93.00%. General appearance: alert and cooperative  Disposition: 01-Home or Self Care  Discharge Instructions   Discharge patient    Complete by:  As directed      Discontinue IV    Complete by:  As directed             Medication List         albuterol 108 (90 BASE) MCG/ACT inhaler  Commonly known as:  PROVENTIL HFA;VENTOLIN HFA  Inhale 2 puffs into the lungs every 4 (four) hours as needed for wheezing.     amLODipine 10 MG tablet  Commonly known as:  NORVASC  Take 10 mg by mouth 2 (two) times daily.     aspirin EC 81 MG tablet  Take 81 mg by mouth daily.     CIPRO 500 MG tablet  Generic drug:  ciprofloxacin  Take 500 mg by mouth 2 (two) times daily.     clonazePAM 0.5 MG tablet  Commonly known as:  KLONOPIN  Take 0.5 mg by mouth 3 (three) times daily as needed for anxiety.     furosemide 20 MG tablet  Commonly known as:  LASIX  Take 20 mg by mouth as needed for fluid.     lisinopril 20 MG tablet  Commonly known as:  PRINIVIL,ZESTRIL  Take 20 mg by mouth at bedtime.     multivitamin with minerals Tabs tablet  Take 1 tablet by mouth daily.     omeprazole 40 MG capsule  Commonly known as:  PRILOSEC  Take 40 mg by mouth 2 (two) times daily.     phenazopyridine 200 MG tablet  Commonly known as:  PYRIDIUM  Take 1 tablet (200 mg  total) by mouth 3 (three) times daily as needed for pain.     polyethylene glycol packet  Commonly known as:  MIRALAX / GLYCOLAX  Take 17 g by mouth daily.     promethazine 12.5 MG tablet  Commonly known as:  PHENERGAN  Take 1 tablet (12.5 mg total) by mouth every 8 (eight) hours as needed for nausea or vomiting.     rizatriptan 10 MG disintegrating tablet  Commonly known as:  MAXALT-MLT  Take 1 tablet (10 mg total) by mouth as needed for migraine. May repeat in 2 hours if needed     topiramate 50 MG tablet  Commonly known as:  TOPAMAX  Take 50 mg by mouth 2 (two) times daily.     traMADol-acetaminophen 37.5-325 MG per tablet  Commonly known as:  ULTRACET  Take 1 tablet by mouth every 6 (six) hours as needed.     trimethoprim 100 MG tablet  Commonly known as:  TRIMPEX  Take 1 tablet (100 mg total) by mouth 1 day or 1 dose.           Follow-up Information   Follow up with Carolan Clines I, MD.   Specialty:  Urology   Contact  information:   [address] [place] [address] [phone]       Signed: Carolan Clines I 04/22/2014, 12:06 PM

## 2014-04-22 NOTE — Discharge Instructions (Signed)
Pain Relief Preoperatively and Postoperatively °Being a good patient does not mean being a silent one. If you have questions, problems, or concerns about the pain you may feel after surgery, let your caregiver know. Patients have the right to assessment and management of pain. The treatment of pain after surgery is important to speed up recovery and return to normal activities. Severe pain after surgery, and the fear or anxiety associated with that pain, may cause extreme discomfort that: °· Prevents sleep. °· Decreases the ability to breathe deeply and cough. This can cause pneumonia or other upper airway infections. °· Causes your heart to beat faster and your blood pressure to be higher. °· Increases the risk for constipation and bloating. °· Decreases the ability of wounds to heal. °· May result in depression, increased anxiety, and feelings of helplessness. °Relief of pain before surgery is also important because it will lessen the pain after surgery. Patients who receive both pain relief before and after surgery experience greater pain relief than those who only receive pain relief after surgery. Let your caregiver know if you are having uncontrolled pain. This is very important. Pain after surgery is more difficult to manage if it is permitted to become severe, so prompt and adequate treatment of acute pain is necessary. °PAIN CONTROL METHODS °Your caregivers follow policies and procedures about the management of patient pain. These guidelines should be explained to you before surgery. Plans for pain control after surgery must be mutually decided upon and instituted with your full understanding and agreement. Do not be afraid to ask questions regarding the care you are receiving. There are many different ways your caregivers will attempt to control your pain, including the following methods. °As needed pain control °· You may be given pain medicine either through your intravenous (IV) tube, or as a pill or  liquid you can swallow. You will need to let your caregiver know when you are having pain. Then, your caregiver will give you the pain medicine ordered for you. °· Your pain medicine may make you constipated. If constipation occurs, drink more liquids if you can. Your caregiver may have you take a mild laxative. °IV patient-controlled analgesia pump (PCA pump) °· You can get your pain medicine through the IV tube which goes into your vein. You are able to control the amount of pain medicine that you get. The pain medicine flows in through an IV tube and is controlled by a pump. This pump gives you a set amount of pain medicine when you push the button hooked up to it. Nobody should push this button but you or someone specifically assigned by you to do so. It is set up to keep you from accidentally giving yourself too much pain medicine. You will be able to start using your pain pump in the recovery room after your surgery. This method can be helpful for most types of surgery. °· If you are still having too much pain, tell your caregiver. Also, tell your caregiver if you are feeling too sleepy or nauseous. °Continuous epidural pain control °· A thin, soft tube (catheter) is put into your back. Pain medicine flows through the catheter to lessen pain in the part of your body where the surgery is done. Continuous epidural pain control may work best for you if you are having surgery on your chest, abdomen, hip area, or legs. The epidural catheter is usually put into your back just before surgery. The catheter is left in until you can eat and take medicine by mouth. In most cases,   this may take 2 to 3 days. °· Giving pain medicine through the epidural catheter may help you heal faster because: °¨ Your bowel gets back to normal faster. °¨ You can get back to eating sooner. °¨ You can be up and walking sooner. °Medicine that numbs the area (local anesthetic) °· You may receive an injection of pain medicine near where the  pain is (local infiltration). °· You may receive an injection of pain medicine near the nerve that controls the sensation to a specific part of the body (peripheral nerve block). °· Medicine may be put in the spine to block pain (spinal block). °Opioids °· Moderate to moderately severe acute pain after surgery may respond to opioids. Opioids are narcotic pain medicine. Opioids are often combined with non-narcotic medicines to improve pain relief, diminish the risk of side effects, and reduce the chance of addiction. °· If you follow your caregiver's directions about taking opioids and you do not have a history of substance abuse, your risk of becoming addicted is exceptionally small. Opioids are given for short periods of time in careful doses to prevent addiction. °Other methods of pain control include: °· Steroids. °· Physical therapy. °· Heat and cold therapy. °· Compression, such as wrapping an elastic bandage around the area of pain. °· Massage. °These various ways of controlling pain may be used together. Combining different methods of pain control is called multimodal analgesia. Using this approach has many benefits, including being able to eat, move around, and leave the hospital sooner. °Document Released: 09/13/2002 Document Revised: 09/15/2011 Document Reviewed: 09/17/2010 °[employer]® Patient Information ©2015 [employer]. This information is not intended to replace advice given to you by your health care provider. Make sure you discuss any questions you have with your health care provider. ° °

## 2014-04-22 NOTE — Progress Notes (Signed)
Urology Progress Note  1 Day Post-Op   Subjective: Pt fels well except has stent pain beginning at 4 AM.    No acute urologic events overnight. Ambulation:   positive Flatus:    positive Bowel movement  negative  Pain: some relief  Objective:  Blood pressure 125/62, pulse 67, temperature 97.8 F (36.6 C), temperature source Oral, resp. rate 20, height 5' 2.5" (1.588 m), weight 99.247 kg (218 lb 12.8 oz), SpO2 93.00%.  Physical Exam:  General:  No acute distress, awake Extremities: Homans sign is negative, no sign of DVT Genitourinary:  Normal BUS Foley:out    I/O last 3 completed shifts: In: 1456.7 [I.V.:1456.7] Out: -   Recent Labs     04/21/14  1730  04/22/14  0540  HGB  11.3*  11.5*  WBC  6.0  5.6  PLT  396  372    Recent Labs     04/21/14  1730  04/22/14  0540  NA  142  139  K  3.6*  4.4  CL  106  104  CO2  21  20  BUN  18  18  CREATININE  1.02  1.07  CALCIUM  8.8  8.9  GFRNONAA  61*  57*  GFRAA  70*  66*     No results found for this basename: PT, INR, APTT,  in the last 72 hours   No components found with this basename: ABG,   Assessment/Plan: Pt will have sten in for 6 weeks then have ureteroscopy adn laser fractionalization of stone. She will need CT stone protocol prior to surgery for stone size and localization.  Pt is to increase activities as tolerated.

## 2014-04-22 NOTE — Progress Notes (Signed)
ANTIBIOTIC CONSULT NOTE - INITIAL  Pharmacy Consult for Cipro-antibiotic renal adjustment Indication: Urology--post procedure--ureteral calculus  No Known Allergies  Patient Measurements: Height: 5' 2.5" (158.8 cm) Weight: 218 lb 12.8 oz (99.247 kg) IBW/kg (Calculated) : 51.25 Adjusted Body Weight:   Vital Signs: Temp: 97.8 F (36.6 C) (10/17 0420) Temp Source: Oral (10/17 0420) BP: 125/62 mmHg (10/17 0420) Pulse Rate: 67 (10/17 0420) Intake/Output from previous day: 10/16 0701 - 10/17 0700 In: 1000 [I.V.:1000] Out: -  Intake/Output from this shift: Total I/O In: 1000 [I.V.:1000] Out: -   Labs:  Recent Labs  04/21/14 1730 04/22/14 0540  WBC 6.0 5.6  HGB 11.3* 11.5*  PLT 396 372  CREATININE 1.02  --    Estimated Creatinine Clearance: 69.4 ml/min (by C-G formula based on Cr of 1.02). No results found for this basename: VANCOTROUGH, VANCOPEAK, VANCORANDOM, GENTTROUGH, GENTPEAK, GENTRANDOM, TOBRATROUGH, TOBRAPEAK, TOBRARND, AMIKACINPEAK, AMIKACINTROU, AMIKACIN,  in the last 72 hours   Microbiology: No results found for this or any previous visit (from the past 720 hour(s)).  Medical History: Past Medical History  Diagnosis Date  . Hypertension   . Arthritis   . GERD (gastroesophageal reflux disease)   . Hyperlipidemia   . History of recurrent UTIs   . H/O hiatal hernia   . History of esophageal dilatation   . History of TIAs NO RESIDUAL    1980;  2005;   2008 PT STATES PER CT  SCARRING RIGHT SIDE OF BRAIN  . Diverticulosis of colon   . SUI (stress urinary incontinence, female)   . Chronic cystitis   . Vaginal vault prolapse   . OSA on CPAP     PER SLEEP STUDY 09-08-2012  MODERATE OSA  . Borderline diabetes   . Nocturia   . Anxiety   . Rectal prolapse   . Neuropathy   . Pneumonia     hx of   . Headache(784.0)     severe headaches   . Anemia     hx of   . MRSA infection     hx of 09/2013     Medications:  Anti-infectives   Start     Dose/Rate  Route Frequency Ordered Stop   04/21/14 2345  ciprofloxacin (CIPRO) tablet 500 mg     500 mg Oral 2 times daily 04/21/14 2333     04/21/14 1858  ceFAZolin (ANCEF) IVPB 2 g/50 mL premix     2 g 100 mL/hr over 30 Minutes Intravenous 30 min pre-op 04/21/14 1858 04/21/14 2000     Assessment: Patient with Urology--post procedure--ureteral calculus orders for pharmacy to adjust antibiotics for renal function.  Goal of Therapy:  Cipro dosed based on patient weight and renal function   Plan:  Follow up culture results No changes to cipro order at this time.  Tyler Deis, Shea Stakes Crowford 04/22/2014,6:28 AM

## 2014-04-24 ENCOUNTER — Ambulatory Visit: Payer: Medicaid Other | Admitting: Physician Assistant

## 2014-04-24 ENCOUNTER — Telehealth: Payer: Self-pay | Admitting: Internal Medicine

## 2014-04-24 ENCOUNTER — Encounter (HOSPITAL_COMMUNITY): Payer: Self-pay | Admitting: Urology

## 2014-04-24 NOTE — Telephone Encounter (Signed)
Left a message for patient to call back. 

## 2014-04-24 NOTE — Telephone Encounter (Signed)
Patient states she is having problems with painful, bleeding hemorrhoid for some time now. It is getting worse. She states she had a rectocele repair in August and on last Friday, had to have a stint placed for kidney stone. She is just now getting to calling about the hemorrhoid issue since she has had so many medical problems recently. Scheduled with Cecille Rubin Hvozdovic, PA-C tomorrow at 10:15 AM.

## 2014-04-25 ENCOUNTER — Encounter: Payer: Self-pay | Admitting: Physician Assistant

## 2014-04-25 ENCOUNTER — Ambulatory Visit (INDEPENDENT_AMBULATORY_CARE_PROVIDER_SITE_OTHER): Payer: Medicaid Other | Admitting: Physician Assistant

## 2014-04-25 ENCOUNTER — Telehealth: Payer: Self-pay | Admitting: Physician Assistant

## 2014-04-25 VITALS — BP 138/84 | HR 88 | Ht 62.0 in | Wt 218.0 lb

## 2014-04-25 DIAGNOSIS — K648 Other hemorrhoids: Secondary | ICD-10-CM

## 2014-04-25 MED ORDER — HYDROCORTISONE 2.5 % RE CREA: 1.0000 "application " | TOPICAL_CREAM | Freq: Two times a day (BID) | RECTAL | Status: DC

## 2014-04-25 MED ORDER — HYDROCORTISONE ACETATE 25 MG RE SUPP: 25.0000 mg | Freq: Two times a day (BID) | RECTAL | Status: DC

## 2014-04-25 NOTE — Progress Notes (Signed)
Patient ID: Ann [name], female   DOB: [birth year], 58 y.o.   MRN: [id number]     History of Present Illness: Ann [name] is a 58 year old female who has previously been followed by Dr. Maurene Capes with a history of esophageal strictures. She has been treated for anal fissures in January of 2014. She recently had surgery in August of 2015 for a posterior vaginal vault prolapse with plication T-cell graft implantation enterocele repair paramedial body plication. She is here today with complaints of rectal pain and itching of one month's duration that is becoming more severe she states when she has a bowel movement she never feels as if she fully evacuates. She has to await copiously after bowel movements and has had anal seepage. Last week she noted" black and blue lumps" protruding from her rectum. These were quite tender. She has had blood on the toilet tissue and blood in the commode after bowel movements. Her stools have been soft as she has been using MiraLAX and Senokot. She states when she has a bowel movement she feels as if she is passing groundglass.The pt is due for CRC screening and she says she has been cleared to have a colonoscopy, but she would like to wait until her hemorrhoids are not bothering her.   Past Medical History  Diagnosis Date  . Hypertension   . Arthritis   . GERD (gastroesophageal reflux disease)   . Hyperlipidemia   . History of recurrent UTIs   . H/O hiatal hernia   . History of esophageal dilatation   . History of TIAs NO RESIDUAL    1980;  2005;   2008 PT STATES PER CT  SCARRING RIGHT SIDE OF BRAIN  . Diverticulosis of colon   . SUI (stress urinary incontinence, female)   . Chronic cystitis   . Vaginal vault prolapse   . OSA on CPAP     PER SLEEP STUDY 09-08-2012  MODERATE OSA  . Borderline diabetes   . Nocturia   . Anxiety   . Rectal prolapse   . Neuropathy   . Pneumonia     hx of   . Headache(784.0)     severe headaches   . Anemia     hx of   . MRSA  infection     hx of 09/2013     Past Surgical History  Procedure Laterality Date  . Knee arthroscopy Bilateral 2008  . Esophageal dilation  X2  LAST ONE  JUNE 2014  . Tubal ligation  1987  . Anterior and posterior repair N/A 06/06/2013    Procedure: Anterior vaginal vault repair, Sacrospinous ligament fixation with UPHOLD lite, Kelly plication, Sacrospinous mesh fixation, Solyx transurethral sling;  Surgeon: Ailene Rud, MD;  Location: [facility];  Service: Urology;  Laterality: N/A;  . Rectocele repair N/A 02/20/2014    Procedure: POSTERIOR REPAIR (RECTOCELE) WITH VAGINAL VAULT REPAIR WITH ACELL GRAFT PLACEMENT, PERINEOPLASTY, ENTEROCELE REPAIR;  Surgeon: Ailene Rud, MD;  Location: [facility];  Service: Urology;  Laterality: N/A;  . Cystoscopy w/ ureteral stent placement Right 04/21/2014    Procedure: CYSTOSCOPY WITH RETROGRADE PYELOGRAM/URETERAL STENT PLACEMENT;  Surgeon: Ailene Rud, MD;  Location: [facility];  Service: Urology;  Laterality: Right;   Family History  Problem Relation Age of Onset  . Colon cancer Father   . Esophageal cancer Neg Hx   . Rectal cancer Neg Hx   . Stomach cancer Neg Hx   . Heart disease Mother    History  Substance Use Topics  . Smoking status: Never Smoker   . Smokeless tobacco: Never Used  . Alcohol Use: No   Current Outpatient Prescriptions  Medication Sig Dispense Refill  . albuterol (PROVENTIL HFA;VENTOLIN HFA) 108 (90 BASE) MCG/ACT inhaler Inhale 2 puffs into the lungs every 4 (four) hours as needed for wheezing.  1 Inhaler  2  . amLODipine (NORVASC) 10 MG tablet Take 10 mg by mouth 2 (two) times daily.      Marland Kitchen aspirin EC 81 MG tablet Take 81 mg by mouth daily.      . clonazePAM (KLONOPIN) 0.5 MG tablet Take 0.5 mg by mouth 3 (three) times daily as needed for anxiety.      . furosemide (LASIX) 20 MG tablet Take 20 mg by mouth as needed for fluid.       Marland Kitchen lisinopril (PRINIVIL,ZESTRIL) 20 MG tablet Take 20 mg by  mouth at bedtime.       . Multiple Vitamin (MULTIVITAMIN WITH MINERALS) TABS tablet Take 1 tablet by mouth daily.      Marland Kitchen omeprazole (PRILOSEC) 40 MG capsule Take 40 mg by mouth 2 (two) times daily.      . phenazopyridine (PYRIDIUM) 200 MG tablet Take 1 tablet (200 mg total) by mouth 3 (three) times daily as needed for pain.  10 tablet  0  . polyethylene glycol (MIRALAX / GLYCOLAX) packet Take 17 g by mouth daily.      . promethazine (PHENERGAN) 12.5 MG tablet Take 1 tablet (12.5 mg total) by mouth every 8 (eight) hours as needed for nausea or vomiting.  30 tablet  0  . rizatriptan (MAXALT-MLT) 10 MG disintegrating tablet Take 1 tablet (10 mg total) by mouth as needed for migraine. May repeat in 2 hours if needed  15 tablet  6  . topiramate (TOPAMAX) 50 MG tablet Take 50 mg by mouth 2 (two) times daily.      . traMADol-acetaminophen (ULTRACET) 37.5-325 MG per tablet Take 1 tablet by mouth every 6 (six) hours as needed.  30 tablet  2  . trimethoprim (TRIMPEX) 100 MG tablet Take 1 tablet (100 mg total) by mouth 1 day or 1 dose.  30 tablet  1  . hydrocortisone (ANUSOL-HC) 25 MG suppository Place 1 suppository (25 mg total) rectally 2 (two) times daily.  24 suppository  1   No current facility-administered medications for this visit.   No Known Allergies    Review of Systems: Gen: Denies any fever, chills, sweats CV: Denies chest pain Resp:neg GI: Denies vomiting blood, jaundice, and fecal incontinence.   Denies dysphagia or odynophagia. GU : Denies urinary burning, blood in urine, urinary frequency, urinary hesitancy, nocturnal urination, and urinary incontinence. MS: neg Derm: neg Psych: neg Heme: neg Neuro:  neg Endo: neg     Physical Exam: General: Pleasant, well developed , well nourished female in no acute distress Head: Normocephalic and atraumatic Eyes:  sclerae anicteric, conjunctiva pink  Ears: Normal auditory acuity Lungs: Clear throughout to auscultation Heart: Regular  rate and rhythm Abdomen: Soft, non distended, non-tender. No masses, no hepatomegaly. Normal bowel sounds Rectal: small skin tag, anoscopy performed and pt noted to have internal hemorrhoids that have a small amount bleeding. Musculoskeletal: Symmetrical with no gross deformities  Extremities: No edema  Neurological: Alert oriented x 4, grossly nonfocal Psychological:  Alert and cooperative. Normal mood and affect  Assessment and Recommendations: #1 internal hemorrhoids. Patient will be given a trial of Anusol HC suppositories to use twice daily  for 10 days she is advised to continue keeping her stools soft but to add Benefiber to try to add a little bulk to her stool. She should use Tucks  wipes after bowel movements as well as Balneol  Lotion. She will followup in 3-4 weeks sooner as needed at which time CRC screening can be reviewed with her,as well as possible banding of hemorrhoids.        Zion Lint, Deloris Ping 04/25/2014, Pager [phone]

## 2014-04-25 NOTE — Patient Instructions (Signed)
We have sent the following medications to your pharmacy for you to pick up at your convenience: Anusol Suppositories, one per rectum twice a day for ten days   Keep your stools soft   Start Benefiber, this can be purchased over the counter   Tucks wipes or Balneol for wiping, this can be purchased over the counter   You have a follow up scheduled with Dr. Olevia Perches for 05-31-2014 at 9 am If you are not feeling better by then please call the office back at [phone]

## 2014-04-25 NOTE — Telephone Encounter (Signed)
Called Medicaid at [phone] Anusol Suppositories are not covered by Medicaid Hydrocortisone Cream is covered  Per Cecille Rubin okay to send the Hydrocortisone Cream

## 2014-04-26 NOTE — Progress Notes (Signed)
Reviewed and agree.

## 2014-04-28 ENCOUNTER — Other Ambulatory Visit: Payer: Self-pay | Admitting: Urology

## 2014-05-26 ENCOUNTER — Encounter (HOSPITAL_BASED_OUTPATIENT_CLINIC_OR_DEPARTMENT_OTHER): Payer: Self-pay | Admitting: *Deleted

## 2014-05-29 ENCOUNTER — Encounter (HOSPITAL_BASED_OUTPATIENT_CLINIC_OR_DEPARTMENT_OTHER): Payer: Self-pay | Admitting: *Deleted

## 2014-05-29 NOTE — Progress Notes (Addendum)
NPO AFTER MN. ARRIVE AT 1000. NEEDS ISTAT.  CURRENT EKG IN CHART AND EPIC. WILL TAKE METOPROLOL AND PRILOSEC AM DOS W/ SIPS OF WATER AND IF NEEDED TAKE PAIN/ NAUSEA RX'S. REVIEWED [facility] GUIDELINES, WILL BRING CPAP AND MEDS.  ADVISED PT IF SHE STILL HAD INCENTIVE SPIROMETRY AT HOME TO START USING 2 DAYS PRIOR TO DOS, PT VERBALIZED UNDERSTANDING AND SHE DOES HAVE ONE.  REVIEW CHART W/ DR DENNENY MDA, OK TO PROCEED.

## 2014-05-31 ENCOUNTER — Encounter: Payer: Self-pay | Admitting: Internal Medicine

## 2014-05-31 ENCOUNTER — Ambulatory Visit (INDEPENDENT_AMBULATORY_CARE_PROVIDER_SITE_OTHER): Payer: Medicaid Other | Admitting: Internal Medicine

## 2014-05-31 VITALS — BP 138/80 | HR 103 | Ht 62.0 in | Wt 213.0 lb

## 2014-05-31 DIAGNOSIS — K648 Other hemorrhoids: Secondary | ICD-10-CM

## 2014-05-31 DIAGNOSIS — R1310 Dysphagia, unspecified: Secondary | ICD-10-CM

## 2014-05-31 MED ORDER — HYDROCORTISONE 2.5 % RE CREA: 1.0000 "application " | TOPICAL_CREAM | Freq: Two times a day (BID) | RECTAL | Status: DC | PRN

## 2014-05-31 NOTE — Progress Notes (Signed)
Ann [name] February 19, 1956 [id number]  Note: This dictation was prepared with Dragon digital system. Any transcriptional errors that result from this procedure are unintentional.   History of Present Illness:  This is a 58 year old white female with symptomatic internal hemorrhoids, history of rectocele and history of anal fissure in January 2014 with repair of posterior vaginal prolapse and enterocele in August 2015. She was seen by Arta Bruce, PA-C last month and started on hydrocortisone suppositories which were later changed to 2.5% cream because her insurance didn't cover the suppositories. She is much better from that respect and she is having much less pain and less bleeding.  She also complains of solid food dysphagia which was evaluated in the past when she underwent upper endoscopy and dilatation. Her last dilatation was one year ago and she had a prior dilatation in 2008. She is on Prilosec 20 mg twice a day.    Past Medical History  Diagnosis Date  . Hypertension   . Arthritis   . GERD (gastroesophageal reflux disease)   . Hyperlipidemia   . History of recurrent UTIs   . H/O hiatal hernia   . History of esophageal dilatation   . History of TIAs NO RESIDUAL    1980;  2005;   2008 PT STATES PER CT  SCARRING RIGHT SIDE OF BRAIN  . Diverticulosis of colon   . SUI (stress urinary incontinence, female)   . Chronic cystitis   . OSA on CPAP     PER SLEEP STUDY 09-08-2012  MODERATE OSA  . Borderline diabetes   . Nocturia   . Anxiety   . Neuropathy   . Headache(784.0)     severe headaches   . Complication of anesthesia     02-20-2014 ([facility]) INTRA-OP RESPIRATORY FAILURE SECONDARY TO POSSIBLE MUCOUS ASPIRATION/  ALSO 06-06-2013 [facility]) POST-OP DESATURATION  EVEN USING CPAP  . Family history of adverse reaction to anesthesia     PER PT SISTER DIED AFTER GENERAL ANESTHESIA DUE TO UNDIAGNOSED OSA  . History of MRSA infection     3/ 2015  AXILLARY ABSCESS    Past Surgical  History  Procedure Laterality Date  . Knee arthroscopy Bilateral 2008  . Esophageal dilation  X2  LAST ONE  JUNE 2014  . Tubal ligation  1987  . Anterior and posterior repair N/A 06/06/2013    Procedure: Anterior vaginal vault repair, Sacrospinous ligament fixation with UPHOLD lite, Kelly plication, Sacrospinous mesh fixation, Solyx transurethral sling;  Surgeon: Ailene Rud, MD;  Location: [facility];  Service: Urology;  Laterality: N/A;  . Rectocele repair N/A 02/20/2014    Procedure: POSTERIOR REPAIR (RECTOCELE) WITH VAGINAL VAULT REPAIR WITH ACELL GRAFT PLACEMENT, PERINEOPLASTY, ENTEROCELE REPAIR;  Surgeon: Ailene Rud, MD;  Location: [facility];  Service: Urology;  Laterality: N/A;  . Cystoscopy w/ ureteral stent placement Right 04/21/2014    Procedure: CYSTOSCOPY WITH RETROGRADE PYELOGRAM/URETERAL STENT PLACEMENT;  Surgeon: Ailene Rud, MD;  Location: [facility];  Service: Urology;  Laterality: Right;    No Known Allergies  Family history and social history have been reviewed.  Review of Systems: Positive for solid food dysphagia. Rectal pain  The remainder of the 10 point ROS is negative except as outlined in the H&P  Physical Exam: General Appearance Well developed, in no distressMildly overweight  Eyes  Non icteric  HEENT  Non traumatic, normocephalic  Mouth No lesion, tongue papillated, no cheilosis Neck Supple without adenopathy, thyroid not enlarged, no carotid bruits, no JVD  Lungs Clear to auscultation bilaterally COR Normal S1, normal S2, regular rhythm, no murmur, quiet precordium Abdomen Soft with normoactive bowel sounds. No distention.  Rectal and anoscopic exam reveals normal perianal area. Very tender anal canal first-grade internal hemorrhoids no active fissure or bleeding. No thrombosis  Extremities  No pedal edema Skin No lesions Neurological Alert and oriented x 3 Psychological Normal mood and affect  Assessment and Plan:    Problem #18 58 year old white female with symptomatic but healing internal hemorrhoids. We will refill hydrocortisone cream to be used twice a day. As an alternative, we will consider hemorrhoidal banding if she fails medical treatment.  Problem #2 Solid food dysphagia. We will schedule an upper endoscopy and possible dictation. She will continue Prilosec 20 mg twice a day.     Delfin Edis 05/31/2014

## 2014-05-31 NOTE — Patient Instructions (Signed)
You have been scheduled for an endoscopy. Please follow written instructions given to you at your visit today. If you use inhalers (even only as needed), please bring them with you on the day of your procedure. Your physician has requested that you go to www.startemmi.com and enter the access code given to you at your visit today. This web site gives a general overview about your procedure. However, you should still follow specific instructions given to you by our office regarding your preparation for the procedure.  We have sent the following medications to your pharmacy for you to pick up at your convenience: Hydrocortisone cream  CC: Ann Abrahams, NP

## 2014-06-05 ENCOUNTER — Ambulatory Visit (HOSPITAL_BASED_OUTPATIENT_CLINIC_OR_DEPARTMENT_OTHER): Payer: Medicaid Other | Admitting: Anesthesiology

## 2014-06-05 ENCOUNTER — Encounter (HOSPITAL_BASED_OUTPATIENT_CLINIC_OR_DEPARTMENT_OTHER): Payer: Self-pay

## 2014-06-05 ENCOUNTER — Ambulatory Visit (HOSPITAL_BASED_OUTPATIENT_CLINIC_OR_DEPARTMENT_OTHER)
Admission: RE | Admit: 2014-06-05 | Discharge: 2014-06-05 | Disposition: A | Discharge status: Home / Self Care | Payer: Medicaid Other | Source: Ambulatory Visit | Attending: Urology | Admitting: Urology

## 2014-06-05 ENCOUNTER — Encounter (HOSPITAL_BASED_OUTPATIENT_CLINIC_OR_DEPARTMENT_OTHER)
Admission: RE | Disposition: A | Discharge status: Home / Self Care | Payer: Self-pay | Source: Ambulatory Visit | Attending: Urology

## 2014-06-05 DIAGNOSIS — N201 Calculus of ureter: Secondary | ICD-10-CM | POA: Diagnosis not present

## 2014-06-05 DIAGNOSIS — Z8614 Personal history of Methicillin resistant Staphylococcus aureus infection: Secondary | ICD-10-CM | POA: Insufficient documentation

## 2014-06-05 DIAGNOSIS — F1099 Alcohol use, unspecified with unspecified alcohol-induced disorder: Secondary | ICD-10-CM | POA: Insufficient documentation

## 2014-06-05 DIAGNOSIS — Z8739 Personal history of other diseases of the musculoskeletal system and connective tissue: Secondary | ICD-10-CM | POA: Diagnosis not present

## 2014-06-05 DIAGNOSIS — G4733 Obstructive sleep apnea (adult) (pediatric): Secondary | ICD-10-CM | POA: Insufficient documentation

## 2014-06-05 DIAGNOSIS — Z8719 Personal history of other diseases of the digestive system: Secondary | ICD-10-CM | POA: Diagnosis not present

## 2014-06-05 DIAGNOSIS — Z831 Family history of other infectious and parasitic diseases: Secondary | ICD-10-CM | POA: Insufficient documentation

## 2014-06-05 DIAGNOSIS — Z8679 Personal history of other diseases of the circulatory system: Secondary | ICD-10-CM | POA: Insufficient documentation

## 2014-06-05 DIAGNOSIS — N1330 Unspecified hydronephrosis: Secondary | ICD-10-CM | POA: Insufficient documentation

## 2014-06-05 DIAGNOSIS — Z8639 Personal history of other endocrine, nutritional and metabolic disease: Secondary | ICD-10-CM | POA: Diagnosis not present

## 2014-06-05 DIAGNOSIS — N200 Calculus of kidney: Secondary | ICD-10-CM | POA: Diagnosis not present

## 2014-06-05 DIAGNOSIS — Z8249 Family history of ischemic heart disease and other diseases of the circulatory system: Secondary | ICD-10-CM | POA: Insufficient documentation

## 2014-06-05 DIAGNOSIS — F419 Anxiety disorder, unspecified: Secondary | ICD-10-CM | POA: Insufficient documentation

## 2014-06-05 DIAGNOSIS — Z809 Family history of malignant neoplasm, unspecified: Secondary | ICD-10-CM | POA: Diagnosis not present

## 2014-06-05 DIAGNOSIS — R51 Headache: Secondary | ICD-10-CM | POA: Diagnosis not present

## 2014-06-05 DIAGNOSIS — Z8673 Personal history of transient ischemic attack (TIA), and cerebral infarction without residual deficits: Secondary | ICD-10-CM | POA: Diagnosis not present

## 2014-06-05 DIAGNOSIS — E559 Vitamin D deficiency, unspecified: Secondary | ICD-10-CM | POA: Insufficient documentation

## 2014-06-05 DIAGNOSIS — N289 Disorder of kidney and ureter, unspecified: Secondary | ICD-10-CM | POA: Insufficient documentation

## 2014-06-05 DIAGNOSIS — F1590 Other stimulant use, unspecified, uncomplicated: Secondary | ICD-10-CM | POA: Diagnosis not present

## 2014-06-05 DIAGNOSIS — Z8669 Personal history of other diseases of the nervous system and sense organs: Secondary | ICD-10-CM | POA: Diagnosis not present

## 2014-06-05 HISTORY — PX: HOLMIUM LASER APPLICATION: SHX5852

## 2014-06-05 HISTORY — DX: Personal history of Methicillin resistant Staphylococcus aureus infection: Z86.14

## 2014-06-05 HISTORY — DX: Family history of other specified conditions: Z84.89

## 2014-06-05 HISTORY — PX: CYSTOSCOPY WITH RETROGRADE PYELOGRAM, URETEROSCOPY AND STENT PLACEMENT: SHX5789

## 2014-06-05 HISTORY — DX: Other complications of anesthesia, initial encounter: T88.59XA

## 2014-06-05 HISTORY — DX: Adverse effect of unspecified anesthetic, initial encounter: T41.45XA

## 2014-06-05 SURGERY — CYSTOURETEROSCOPY, WITH RETROGRADE PYELOGRAM AND STENT INSERTION
Anesthesia: General | Site: Ureter | Laterality: Right

## 2014-06-05 MED ORDER — IOHEXOL 350 MG/ML SOLN
INTRAVENOUS | Status: DC | PRN
  Administered 2014-06-05: 14 mL

## 2014-06-05 MED ORDER — DEXAMETHASONE SODIUM PHOSPHATE 4 MG/ML IJ SOLN
INTRAMUSCULAR | Status: DC | PRN
  Administered 2014-06-05: 10 mg via INTRAVENOUS

## 2014-06-05 MED ORDER — OXYBUTYNIN CHLORIDE 5 MG PO TABS
5.0000 mg | ORAL_TABLET | Freq: Three times a day (TID) | ORAL | Status: DC
  Filled 2014-06-05: qty 1

## 2014-06-05 MED ORDER — FENTANYL CITRATE 0.05 MG/ML IJ SOLN
INTRAMUSCULAR | Status: AC
  Filled 2014-06-05: qty 2

## 2014-06-05 MED ORDER — FENTANYL CITRATE 0.05 MG/ML IJ SOLN
INTRAMUSCULAR | Status: AC
  Filled 2014-06-05: qty 4

## 2014-06-05 MED ORDER — SODIUM CHLORIDE 0.9 % IR SOLN
Status: DC | PRN
  Administered 2014-06-05: 6000 mL
  Administered 2014-06-05: 500 mL

## 2014-06-05 MED ORDER — OXYBUTYNIN CHLORIDE 5 MG PO TABS
ORAL_TABLET | ORAL | Status: AC
  Filled 2014-06-05: qty 1

## 2014-06-05 MED ORDER — LIDOCAINE HCL (CARDIAC) 20 MG/ML IV SOLN
INTRAVENOUS | Status: DC | PRN
  Administered 2014-06-05: 60 mg via INTRAVENOUS
  Administered 2014-06-05: 40 mg via INTRAVENOUS

## 2014-06-05 MED ORDER — BELLADONNA ALKALOIDS-OPIUM 16.2-60 MG RE SUPP
RECTAL | Status: AC
  Filled 2014-06-05: qty 1

## 2014-06-05 MED ORDER — LIDOCAINE HCL 4 % MT SOLN
OROMUCOSAL | Status: DC | PRN
  Administered 2014-06-05: 3 mL via TOPICAL

## 2014-06-05 MED ORDER — ACETAMINOPHEN 10 MG/ML IV SOLN
INTRAVENOUS | Status: DC | PRN
  Administered 2014-06-05: 1000 mg via INTRAVENOUS

## 2014-06-05 MED ORDER — TRIMETHOPRIM 100 MG PO TABS: 100.0000 mg | ORAL_TABLET | ORAL | Status: DC

## 2014-06-05 MED ORDER — LACTATED RINGERS IV SOLN
INTRAVENOUS | Status: DC
  Administered 2014-06-05 (×2): via INTRAVENOUS
  Filled 2014-06-05: qty 1000

## 2014-06-05 MED ORDER — SUCCINYLCHOLINE CHLORIDE 20 MG/ML IJ SOLN
INTRAMUSCULAR | Status: DC | PRN
  Administered 2014-06-05: 100 mg via INTRAVENOUS

## 2014-06-05 MED ORDER — PHENAZOPYRIDINE HCL 200 MG PO TABS: 200.0000 mg | ORAL_TABLET | Freq: Three times a day (TID) | ORAL | Status: DC | PRN

## 2014-06-05 MED ORDER — PROMETHAZINE HCL 25 MG/ML IJ SOLN
6.2500 mg | INTRAMUSCULAR | Status: DC | PRN
  Filled 2014-06-05: qty 1

## 2014-06-05 MED ORDER — CEFAZOLIN SODIUM-DEXTROSE 2-3 GM-% IV SOLR
INTRAVENOUS | Status: AC
  Filled 2014-06-05: qty 50

## 2014-06-05 MED ORDER — MIDAZOLAM HCL 5 MG/5ML IJ SOLN
INTRAMUSCULAR | Status: DC | PRN
  Administered 2014-06-05: 2 mg via INTRAVENOUS

## 2014-06-05 MED ORDER — OXYCODONE-ACETAMINOPHEN 5-325 MG PO TABS: 1.0000 | ORAL_TABLET | Freq: Four times a day (QID) | ORAL | Status: DC | PRN

## 2014-06-05 MED ORDER — OXYBUTYNIN CHLORIDE 5 MG PO TABS: ORAL_TABLET | ORAL | Status: DC

## 2014-06-05 MED ORDER — BELLADONNA ALKALOIDS-OPIUM 16.2-60 MG RE SUPP
RECTAL | Status: DC | PRN
  Administered 2014-06-05: 1 via RECTAL

## 2014-06-05 MED ORDER — FENTANYL CITRATE 0.05 MG/ML IJ SOLN
25.0000 ug | INTRAMUSCULAR | Status: DC | PRN
  Administered 2014-06-05: 25 ug via INTRAVENOUS
  Filled 2014-06-05: qty 1

## 2014-06-05 MED ORDER — KETOROLAC TROMETHAMINE 30 MG/ML IJ SOLN
INTRAMUSCULAR | Status: DC | PRN
  Administered 2014-06-05: 30 mg via INTRAVENOUS

## 2014-06-05 MED ORDER — MIDAZOLAM HCL 2 MG/2ML IJ SOLN
INTRAMUSCULAR | Status: AC
  Filled 2014-06-05: qty 2

## 2014-06-05 MED ORDER — CEFAZOLIN SODIUM-DEXTROSE 2-3 GM-% IV SOLR
2.0000 g | INTRAVENOUS | Status: AC
  Administered 2014-06-05: 2 g via INTRAVENOUS
  Filled 2014-06-05: qty 50

## 2014-06-05 MED ORDER — PROPOFOL 10 MG/ML IV BOLUS
INTRAVENOUS | Status: DC | PRN
  Administered 2014-06-05: 100 mg via INTRAVENOUS
  Administered 2014-06-05: 200 mg via INTRAVENOUS
  Administered 2014-06-05: 50 mg via INTRAVENOUS

## 2014-06-05 MED ORDER — FENTANYL CITRATE 0.05 MG/ML IJ SOLN
INTRAMUSCULAR | Status: DC | PRN
  Administered 2014-06-05: 25 ug via INTRAVENOUS
  Administered 2014-06-05: 50 ug via INTRAVENOUS
  Administered 2014-06-05: 25 ug via INTRAVENOUS
  Administered 2014-06-05 (×2): 50 ug via INTRAVENOUS

## 2014-06-05 MED ORDER — ONDANSETRON HCL 4 MG/2ML IJ SOLN
INTRAMUSCULAR | Status: DC | PRN
  Administered 2014-06-05: 4 mg via INTRAVENOUS

## 2014-06-05 SURGICAL SUPPLY — 22 items
BAG DRAIN URO-CYSTO SKYTR STRL (DRAIN) ×3 IMPLANT
BASKET LASER NITINOL 1.9FR (BASKET) ×3 IMPLANT
CANISTER SUCT LVC 12 LTR MEDI- (MISCELLANEOUS) ×3 IMPLANT
CATH INTERMIT  6FR 70CM (CATHETERS) ×3 IMPLANT
CLOTH BEACON ORANGE TIMEOUT ST (SAFETY) ×3 IMPLANT
DRAPE CAMERA CLOSED 9X96 (DRAPES) ×3 IMPLANT
FIBER LASER FLEXIVA 365 (UROLOGICAL SUPPLIES) ×3 IMPLANT
GLOVE BIO SURGEON STRL SZ 6.5 (GLOVE) ×2 IMPLANT
GLOVE BIO SURGEON STRL SZ7 (GLOVE) ×6 IMPLANT
GLOVE BIO SURGEONS STRL SZ 6.5 (GLOVE) ×1
GLOVE BIOGEL PI IND STRL 6.5 (GLOVE) ×1 IMPLANT
GLOVE BIOGEL PI IND STRL 7.0 (GLOVE) ×2 IMPLANT
GLOVE BIOGEL PI INDICATOR 6.5 (GLOVE) ×2
GLOVE BIOGEL PI INDICATOR 7.0 (GLOVE) ×4
GOWN STRL REUS W/ TWL LRG LVL3 (GOWN DISPOSABLE) ×1 IMPLANT
GOWN STRL REUS W/ TWL XL LVL3 (GOWN DISPOSABLE) ×2 IMPLANT
GOWN STRL REUS W/TWL LRG LVL3 (GOWN DISPOSABLE) ×2
GOWN STRL REUS W/TWL XL LVL3 (GOWN DISPOSABLE) ×4
GUIDEWIRE STR DUAL SENSOR (WIRE) ×6 IMPLANT
IV NS IRRIG 3000ML ARTHROMATIC (IV SOLUTION) ×6 IMPLANT
SHEATH ACCESS URETERAL 38CM (SHEATH) IMPLANT
STENT URET 6FRX24 CONTOUR (STENTS) ×3 IMPLANT

## 2014-06-05 NOTE — H&P (Addendum)
Reason For Visit Low urine output   Active Problems Problems  1. Ureteral calculus (N20.1)   Assessed By: Rana Snare (Urology); Last Assessed: 20 Apr 2014  History of Present Illness    58 yo female presents today because of low urine output. She was seen by Dr. Risa Grill yesterday as as an acute work and with a newly diagnosed large right ureteral stone and hydronephrosis. Patient is currently recovering from rectocele and enterocele surgery by Dr. Gaynelle Arabian. Apparently that surgery was complicated by some anesthetic complications with a question of aspiration. She has no prior history of nephrolithiasis. She presented with significant right flank pain. She was seen recently at [facility] family [facility]. At that time Y blood cell count was normal. Creatinine also fairly normal at 1.1. Urinalysis showed leukocytes and a trace of blood. The patient subsequently had a CT performed of the abdomen and pelvis without contrast today. This showed a 10 x 7 mm stone in the right mid ureter. There is significant hydronephrosis noted. The patient states that she has had chills at night. She reports that this is been going on for several days. Yesterday she was started on ciprofloxacin. She has had no dysuria. She has had ongoing flank pain but it is relatively mild at this point. She comes in in no apparent distress. She is currently afebrile. Urinalysis does show significant pyuria but she has had that on a chronic basis on every urinalysis she has had here and has had 3 cultures all of which have been negative. I suspect her chronically abnormal urinalysis may have been on the basis of some urinary tract inflammation from this large stone. It's difficult to know how long this stone is actually been in her ureter. KUB imaging today a faint 8 x 10 mm stone is noted just above pelvis.   Past Medical History Problems  1. History of Anxiety (F41.9) 2. History of arthritis (Z87.39) 3. History of  gastroesophageal reflux (GERD) (Z87.19) 4. History of hiatal hernia (Z87.19) 5. History of hyperlipidemia (Z86.39) 6. History of hypertension (Z86.79) 7. History of methicillin resistant Staphylococcus aureus infection (Z86.14) 8. History of neuropathy (Z86.69) 9. History of stroke (Z86.73) 10. History of Obstructive sleep apnea, adult (G47.33) 11. History of Vitamin D deficiency (E55.9)  Surgical History Problems  1. History of Anterior Colporrhaphy, Repair Of Cystocele 2. History of Enterocele Repair, Vag. Approach (Sep Procedure) 3. History of Esophageal Dilation 4. History of Knee Arthroscopy 5. History of Mid-Urethral Sling Operation 6. History of Posterior Colporrhaphy (For Pelvic Relaxation) 7. History of Sacrospinous Ligament Fixation For Posthysterectomy  Prolapse 8. History of Tubal Ligation 9. History of Vaginal Surg Insertion Of Prosthesis For Pelvic Floor Repair  Current Meds 1. AmLODIPine Besylate 10 MG Oral Tablet;  Therapy: (Recorded:27Oct2014) to Recorded 2. Aspirin 81 MG Oral Tablet;  Therapy: (Recorded:27Oct2014) to Recorded 3. Cipro TABS;  Therapy: (Recorded:15Oct2015) to Recorded 4. ClonazePAM TABS;  Therapy: (Recorded:15Oct2015) to Recorded 5. Furosemide TABS;  Therapy: (Recorded:15Oct2015) to Recorded 6. Nortriptyline HCl - 10 MG Oral Capsule;  Therapy: (Recorded:27Oct2014) to Recorded 7. Ondansetron HCl - 4 MG Oral Tablet; TAKE 1 TABLET Every 6 hours PRN;  Therapy: [phone] to (Last Rx:15Oct2015)  Requested for: 15Oct2015  Ordered 8. Polyethylene Glycol 3350 Oral Powder; MIX ONE CAPFUL IN 8 OUNCES  OF WATER, JUICE OR TEA AND DRINK DAILY;  Therapy: 03Sep2015 to (Last Rx:03Sep2015)  Requested for: 08Sep2015  Ordered 9. Prevacid 24HR 15 MG Oral Capsule Delayed Release;  Therapy: (Recorded:15Oct2015) to Recorded 10. Senokot  TABS;   Therapy: (Recorded:25Aug2015) to Recorded 11. Topiramate 50 MG Oral Tablet;   Therapy: (Recorded:27Oct2014) to  Recorded  Allergies Medication  1. No Known Drug Allergies  Family History Problems  1. Family history of Deceased : Father, Mother 2. Family history of cardiac disorder (Z82.49) : Mother 3. Family history of malignant neoplasm (Z80.9) : Father 4. Family history of tuberculosis (Z64.1)  Social History Problems  1. Alcohol use 2. Caffeine use (F15.90)   1 per day 3. Married 4. Never smoker 5. No alcohol use 6. Number of children   1 son, 2 daughters  Review of Systems Genitourinary, constitutional, skin, eye, otolaryngeal, hematologic/lymphatic, cardiovascular, pulmonary, endocrine, musculoskeletal, gastrointestinal, neurological and psychiatric system(s) were reviewed and pertinent findings if present are noted.  Genitourinary: no hematuria.  Gastrointestinal: nausea, flank pain and abdominal pain.  Constitutional: night sweats and feeling tired (fatigue).    Vitals Vital Signs [Data Includes: Last 1 Day]  Recorded: 16Oct2015 03:10PM  Blood Pressure: 138 / 86 Temperature: 97.3 F Heart Rate: 75 Recorded: 15Oct2015 01:46PM  Blood Pressure: 148 / 88 Temperature: 97.8 F Heart Rate: 80  Physical Exam Constitutional: Well nourished and well developed . No acute distress.  ENT:. The ears and nose are normal in appearance.  Neck: The appearance of the neck is normal and no neck mass is present.  Pulmonary: No respiratory distress . No accessory muscle use.  Cardiovascular:. No peripheral edema.  Abdomen: The abdomen is obese, but not distended. The abdomen is soft and nontender. No masses are palpated. No CVA tenderness. Bowel sounds are normal. No hernias are palpable. No hepatosplenomegaly noted.  Lymphatics: The femoral and inguinal nodes are not enlarged or tender.  Skin: Normal skin turgor, no visible rash and no visible skin lesions.  Neuro/Psych:. Mood and affect are appropriate.    Results/Data  Urine [Data Includes: Last 1 Day]   16Oct2015  COLOR YELLOW    APPEARANCE CLEAR   SPECIFIC GRAVITY 1.010   pH 6.0   GLUCOSE NEG mg/dL  BILIRUBIN NEG   KETONE NEG mg/dL  BLOOD MOD   PROTEIN NEG mg/dL  UROBILINOGEN 0.2 mg/dL  NITRITE NEG   LEUKOCYTE ESTERASE LARGE   SQUAMOUS EPITHELIAL/HPF NONE SEEN   WBC 11-20 WBC/hpf  RBC 3-6 RBC/hpf  BACTERIA FEW   CRYSTALS NONE SEEN   CASTS NONE SEEN    PVR: Ultrasound PVR 23 ml.  21 Apr 2014 2:50 PM  UA With REFLEX    COLOR YELLOW     APPEARANCE CLEAR     SPECIFIC GRAVITY 1.010     pH 6.0     GLUCOSE NEG     BILIRUBIN NEG     KETONE NEG     BLOOD MOD     PROTEIN NEG     UROBILINOGEN 0.2     NITRITE NEG     LEUKOCYTE ESTERASE LARGE     SQUAMOUS EPITHELIAL/HPF NONE SEEN     WBC 11-20     CRYSTALS NONE SEEN     CASTS NONE SEEN     RBC 3-6     BACTERIA FEW     Assessment Assessed  1. Calculus of right ureter (N20.1) 2. Hydronephrosis, right (N13.30) 3. Nephrolithiasis (N20.0)  Pt dehydrated. Needs KUB. She will need cysto, R retrograde pyelogram and Right JJ stent.   Plan Calculus of right ureter, Hydronephrosis, right, Ureteral calculus  1. KUB; Status:Canceled - Appointment,Date of Service;  Incomplete bladder emptying  2. PVR U/S; Status:Complete;   Done:  [phone]  Right stent tonight.   Probqble admision for IV hydration.   Signatures   Pt now post JJ stent for Right ureteral stent, and is now for ureteroscopy and laser of Right ureteral stone.

## 2014-06-05 NOTE — Transfer of Care (Deleted)
Immediate Anesthesia Transfer of Care Note  Patient: Ann [name]  Procedure(s) Performed: Procedure(s) (LRB): CYSTOSCOPY WITH RIGHT RETROGRADE PYELOGRAM, RIGHT URETEROSCOPY, STONE EXTRACTION AND RIGHT DOUBLE J STENT PLACEMENT (Right) HOLMIUM LASER OF STONE  (Right)  Patient Location: PACU  Anesthesia Type: General  Level of Consciousness: awake, alert  and oriented  Airway & Oxygen Therapy: Patient Spontanous Breathing and Patient connected to face mask oxygen  Post-op Assessment: Report given to PACU RN and Post -op Vital signs reviewed and stable  Post vital signs: Reviewed and stable  Complications: No apparent anesthesia complications

## 2014-06-05 NOTE — Anesthesia Postprocedure Evaluation (Signed)
  Anesthesia Post-op Note  Patient: Ann [name]  Procedure(s) Performed: Procedure(s) (LRB): CYSTOSCOPY WITH RIGHT RETROGRADE PYELOGRAM, RIGHT URETEROSCOPY, STONE EXTRACTION AND RIGHT DOUBLE J STENT PLACEMENT (Right) HOLMIUM LASER OF STONE  (Right)  Patient Location: PACU  Anesthesia Type: General  Level of Consciousness: awake and alert   Airway and Oxygen Therapy: Patient Spontanous Breathing  Post-op Pain: mild  Post-op Assessment: Post-op Vital signs reviewed, Patient's Cardiovascular Status Stable, Respiratory Function Stable, Patent Airway and No signs of Nausea or vomiting  Last Vitals:  Filed Vitals:   06/05/14 1415  BP: 145/97  Pulse: 83  Temp:   Resp: 17    Post-op Vital Signs: stable   Complications: No apparent anesthesia complications

## 2014-06-05 NOTE — Anesthesia Preprocedure Evaluation (Addendum)
Anesthesia Evaluation  Patient identified by MRN, date of birth, ID band Patient awake    Reviewed: Allergy & Precautions, H&P , NPO status , Patient's Chart, lab work & pertinent test results  History of Anesthesia Complications (+) Family history of anesthesia reaction and history of anesthetic complications  Airway Mallampati: II  TM Distance: >3 FB Neck ROM: Full    Dental  (+)    Pulmonary sleep apnea ,  breath sounds clear to auscultation  Pulmonary exam normal       Cardiovascular Exercise Tolerance: Good hypertension, Pt. on medications Rhythm:Regular Rate:Normal     Neuro/Psych  Headaches, Anxiety  Neuromuscular disease    GI/Hepatic Neg liver ROS, hiatal hernia, GERD-  Medicated,  Endo/Other  negative endocrine ROS  Renal/GU Renal disease  negative genitourinary   Musculoskeletal  (+) Arthritis -,   Abdominal   Peds negative pediatric ROS (+)  Hematology negative hematology ROS (+)   Anesthesia Other Findings   Reproductive/Obstetrics negative OB ROS                            Anesthesia Physical Anesthesia Plan  ASA: III  Anesthesia Plan: General   Post-op Pain Management:    Induction: Intravenous  Airway Management Planned: Oral ETT  Additional Equipment:   Intra-op Plan:   Post-operative Plan: Extubation in OR  Informed Consent: I have reviewed the patients History and Physical, chart, labs and discussed the procedure including the risks, benefits and alternatives for the proposed anesthesia with the patient or authorized representative who has indicated his/her understanding and acceptance.   Dental advisory given  Plan Discussed with: CRNA  Anesthesia Plan Comments: (Possible h/o aspiration with LMA in the past. Plan ETT)        Anesthesia Quick Evaluation

## 2014-06-05 NOTE — Transfer of Care (Signed)
Immediate Anesthesia Transfer of Care Note  Patient: GENESIA CASLIN  Procedure(s) Performed: Procedure(s) (LRB): CYSTOSCOPY WITH RIGHT RETROGRADE PYELOGRAM, RIGHT URETEROSCOPY, STONE EXTRACTION AND RIGHT DOUBLE J STENT PLACEMENT (Right) HOLMIUM LASER OF STONE  (Right)  Patient Location: PACU  Anesthesia Type: General  Level of Consciousness: awake, alert  and oriented  Airway & Oxygen Therapy: Patient Spontanous Breathing and Patient connected to face mask oxygen  Post-op Assessment: Report given to PACU RN and Post -op Vital signs reviewed and stable  Post vital signs: Reviewed and stable  Complications: No apparent anesthesia complicationsImmediate Anesthesia Transfer of Care Note  Patient: LORETTA DOUTT  Procedure(s) Performed: Procedure(s) (LRB): CYSTOSCOPY WITH RIGHT RETROGRADE PYELOGRAM, RIGHT URETEROSCOPY, STONE EXTRACTION AND RIGHT DOUBLE J STENT PLACEMENT (Right) HOLMIUM LASER OF STONE  (Right)  Patient Location: PACU  Anesthesia Type: General  Level of Consciousness: awake, alert  and oriented  Airway & Oxygen Therapy: Patient Spontanous Breathing and Patient connected to face mask oxygen  Post-op Assessment: Report given to PACU RN and Post -op Vital signs reviewed and stable  Post vital signs: Reviewed and stable  Complications: No apparent anesthesia complications

## 2014-06-05 NOTE — Op Note (Signed)
Pre-operative diagnosis :  Impacted right mid ureteral calculus Postoperative diagnosis: Same   Operation: Cystourethroscopy, right retrograde pyelogram interpretation, right ureteroscopy, laser fragmentation of right impacted 10 mm mid and upper ureteral calculus with basket traction fragments, right double-J stent    Surgeon:  S. Gaynelle Arabian, MD  First assistant:  None   Anesthesia:  General LMA   Preparation:  After appropriate preanesthesia, the patient was brought the operating room, placed the operating table in the dorsal supine position where general LMA anesthesia was introduced. She was then replaced in the dorsal lithotomy position with the pubis was prepped with Betadine solution and draped in usual fashion. The history was reviewed. The x-rays were reviewed. The armband was double checked. The right side was previously marked.   Review history:  58 yo female presents for  Removal of 39mm  Right mid ureteral stone ( impacted). She is post Right JJ stent.    She was seen by Dr. Risa Grill  as as an acute work and with a newly diagnosed large right ureteral stone and hydronephrosis. Patient is currently recovering from rectocele and enterocele surgery by Dr. Gaynelle Arabian. Apparently that surgery was complicated by some anesthetic complications with a question of aspiration. She has no prior history of nephrolithiasis. She presented with significant right flank pain. She was seen recently at [facility] family medicine. At that time Y blood cell count was normal. Creatinine also fairly normal at 1.1. Urinalysis showed leukocytes and a trace of blood. The patient subsequently had a CT performed of the abdomen and pelvis without contrast today. This showed a 10 x 7 mm stone in the right mid ureter. There is significant hydronephrosis noted. The patient states that she has had chills at night. She reports that this is been going on for several days. Yesterday she was started on ciprofloxacin. She has had  no dysuria. She has had ongoing flank pain but it is relatively mild at this point. She comes in in no apparent distress. She is currently afebrile. Urinalysis does show significant pyuria but she has had that on a chronic basis on every urinalysis she has had here and has had 3 cultures all of which have been negative. I suspect her chronically abnormal urinalysis may have been on the basis of some urinary tract inflammation from this large stone. It's difficult to know how long this stone is actually been in her ureter. KUB imaging today a faint 8 x 10 mm stone is noted just above pelvis.   Statement of  Likelihood of Success: Excellent. TIME-OUT observed.:  Procedure: Cystourethroscopy is a Compass, right double-J catheter was identified and was removed halfway. A sensor wire was then placed through the double-J stent and coiled in the renal pelvis.  It is noted that the stent appeared to be lithogenic, with encrustation on the distal portion of the stent.  The 6.5 short ureteroscope was passed through the ureteral orifice and into the lower ureter. The lower ureter appeared to have a significant scar within it, and a false passage was then verified. The ureteroscope was passed over the false passage, which was very tight. This appeared to be an area of ureteral stricture. There appeared to be poor blood supply in this region. The scope was very difficult to be passed through this area, and in fact, this 6.5 [place] scope [place]. A second 6.5 [place] scope was then passed to the area, and a second guidewire was passed through the area, and the renal pelvis. The second  scope was then passed through the area into the upper ureter, where the impacted stone was identified. Using a laser fiber with 0.5/10 power, the best the stone was fragmented. The stone was noted to be grown into the wall on the right side, and in the left posterior portion of the ureter. Following laser fragmentation, the fragments were  basket extracted and removed. Clot was noted within the ureter, and this was extracted as well. The scope was passed all way to the renal pelvis, no further stone was identified. The wire was noted to be coiled in the renal pelvis. The ureteroscope was removed, and a 6 [place] by 24 cm double-J stent was then passed and coiled into the renal pelvis. Stone or clot was evacuated from the bladder until clear irrigation was noted. The patient was given IV Tylenol and IV Toradol, and awakened and taken to recovery room in good condition.

## 2014-06-05 NOTE — Interval H&P Note (Signed)
History and Physical Interval Note:  06/05/2014 11:58 AM  Ann [name]  has presented today for surgery, with the diagnosis of RIGHT URETERAL STONE   The various methods of treatment have been discussed with the patient and family. After consideration of risks, benefits and other options for treatment, the patient has consented to  Procedure(s): CYSTOSCOPY WITH RIGHT RETROGRADE PYELOGRAM, RIGHT URETEROSCOPY AND RIGHT DOUBLE J STENT PLACEMENT (Right) HOLMIUM LASER OF STONE  (Right) as a surgical intervention .  The patient's history has been reviewed, patient examined, no change in status, stable for surgery.  I have reviewed the patient's chart and labs.  Questions were answered to the patient's satisfaction.     Carolan Clines I

## 2014-06-05 NOTE — Anesthesia Procedure Notes (Signed)
Procedure Name: Intubation Date/Time: 06/05/2014 12:26 PM Performed by: Mechele Claude Pre-anesthesia Checklist: Patient identified, Emergency Drugs available, Suction available and Patient being monitored Patient Re-evaluated:Patient Re-evaluated prior to inductionOxygen Delivery Method: Circle System Utilized Preoxygenation: Pre-oxygenation with 100% oxygen Intubation Type: IV induction Ventilation: Mask ventilation without difficulty Laryngoscope Size: Mac and 3 Grade View: Grade I Tube type: Oral Tube size: 7.0 mm Number of attempts: 1 Airway Equipment and Method: stylet and LTA kit utilized Placement Confirmation: ETT inserted through vocal cords under direct vision,  positive ETCO2 and breath sounds checked- equal and bilateral Secured at: 21 cm Tube secured with: Tape Dental Injury: Teeth and Oropharynx as per pre-operative assessment

## 2014-06-05 NOTE — Discharge Instructions (Addendum)
DISCHARGE INSTRUCTIONS FOR KIDNEY STONES OR URETERAL STENT  MEDICATIONS:   1. DO NOT RESUME YOUR ASPIRIN, or any other medicines like ibuprofen, motrin, excedrin, advil, aleve, vitamin E, fish oil as these can all cause bleeding x 7 days.  2. Resume all your other meds from home - except do not take any other pain meds that you may have at home.  ACTIVITY 1. No strenuous activity x 1week 2. No driving while on narcotic pain medications 3. Drink plenty of water 4. Continue to walk at home - you can still get blood clots when you are at home, so keep active, but don't over do it. 5. May return to work in 3 days.  BATHING 1. You can shower and we recommend daily showers  2. If you have a string coming from your urethra:  The stent string is attached to your ureteral stent.  Do not pull on this.   SIGNS/SYMPTOMS TO CALL: 1. Please call us if you have a fever greater than 101.5, uncontrolled  nausea/vomiting, uncontrolled pain, dizziness, unable to urinate, bloody urine, chest pain, shortness of breath, leg swelling, leg pain, redness around wound, drainage from wound, or any other concerns or questions.  You can reach [place] at [phone].  FOLLOW-UP You have an appointment: call [phone] for appointment for stent removal.    You may feel an odd sensation in your back. OK for occasional blood in the urine.   Post Anesthesia Home Care Instructions  Activity: Get plenty of rest for the remainder of the day. A responsible adult should stay with you for 24 hours following the procedure.  For the next 24 hours, DO NOT: -Drive a car -Paediatric nurse -Drink alcoholic beverages -Take any medication unless instructed by your physician -Make any legal decisions or sign important papers.  Meals: Start with liquid foods such as gelatin or soup. Progress to regular foods as tolerated. Avoid greasy, spicy, heavy foods. If nausea and/or vomiting occur, drink only clear liquids until the nausea  and/or vomiting subsides. Call your physician if vomiting continues.  Special Instructions/Symptoms: Your throat may feel dry or sore from the anesthesia or the breathing tube placed in your throat during surgery. If this causes discomfort, gargle with warm salt water. The discomfort should disappear within 24 hours.

## 2014-06-06 ENCOUNTER — Encounter (HOSPITAL_BASED_OUTPATIENT_CLINIC_OR_DEPARTMENT_OTHER): Payer: Self-pay | Admitting: Urology

## 2014-06-07 ENCOUNTER — Encounter: Payer: Self-pay | Admitting: Internal Medicine

## 2014-06-07 ENCOUNTER — Ambulatory Visit (AMBULATORY_SURGERY_CENTER): Payer: Medicaid Other | Admitting: Internal Medicine

## 2014-06-07 VITALS — BP 145/95 | HR 57 | Temp 97.7°F | Resp 19 | Ht 62.0 in | Wt 213.0 lb

## 2014-06-07 DIAGNOSIS — R1310 Dysphagia, unspecified: Secondary | ICD-10-CM

## 2014-06-07 DIAGNOSIS — K222 Esophageal obstruction: Secondary | ICD-10-CM

## 2014-06-07 DIAGNOSIS — K209 Esophagitis, unspecified without bleeding: Secondary | ICD-10-CM

## 2014-06-07 MED ORDER — OMEPRAZOLE 40 MG PO CPDR: 40.0000 mg | DELAYED_RELEASE_CAPSULE | Freq: Two times a day (BID) | ORAL | Status: DC

## 2014-06-07 MED ORDER — SODIUM CHLORIDE 0.9 % IV SOLN: 500.0000 mL | INTRAVENOUS | Status: DC

## 2014-06-07 NOTE — Patient Instructions (Signed)
YOU HAD AN ENDOSCOPIC PROCEDURE TODAY AT [facility]: Refer to the procedure report that was given to you for any specific questions about what was found during the examination.  If the procedure report does not answer your questions, please call your gastroenterologist to clarify.  If you requested that your care partner not be given the details of your procedure findings, then the procedure report has been included in a sealed envelope for you to review at your convenience later.  YOU SHOULD EXPECT: Some feelings of bloating in the abdomen. Passage of more gas than usual.  Walking can help get rid of the air that was put into your GI tract during the procedure and reduce the bloating. If you had a lower endoscopy (such as a colonoscopy or flexible sigmoidoscopy) you may notice spotting of blood in your stool or on the toilet paper. If you underwent a bowel prep for your procedure, then you may not have a normal bowel movement for a few days.  DIET: NOTHING TO EAT OR DRINK UNTIL 3:30. 3:30 UNTIL 4:30 ONLY CLEAR LIQUIDS. 4:30 PM UNTIL MORNING ONLY SOFT FOODS. RESUME YOUR REGULAR DIET IN AM.  ACTIVITY: Your care partner should take you home directly after the procedure.  You should plan to take it easy, moving slowly for the rest of the day.  You can resume normal activity the day after the procedure however you should NOT DRIVE or use heavy machinery for 24 hours (because of the sedation medicines used during the test).    SYMPTOMS TO REPORT IMMEDIATELY: A gastroenterologist can be reached at any hour.  During normal business hours, 8:30 AM to 5:00 PM Monday through Friday, call [phone].  After hours and on weekends, please call the GI answering service at [phone] who will take a message and have the physician on call contact you.  Following upper endoscopy (EGD)  Vomiting of blood or coffee ground material  New chest pain or pain under the shoulder blades  Painful or  persistently difficult swallowing  New shortness of breath  Fever of 100F or higher  Black, tarry-looking stools  FOLLOW UP: If any biopsies were taken you will be contacted by phone or by letter within the next 1-3 weeks.  Call your gastroenterologist if you have not heard about the biopsies in 3 weeks.  Our staff will call the home number listed on your records the next business day following your procedure to check on you and address any questions or concerns that you may have at that time regarding the information given to you following your procedure. This is a courtesy call and so if there is no answer at the home number and we have not heard from you through the emergency physician on call, we will assume that you have returned to your regular daily activities without incident.  SIGNATURES/CONFIDENTIALITY: You and/or your care partner have signed paperwork which will be entered into your electronic medical record.  These signatures attest to the fact that that the information above on your After Visit Summary has been reviewed and is understood.  Full responsibility of the confidentiality of this discharge information lies with you and/or your care-partner.

## 2014-06-07 NOTE — Op Note (Signed)
[name]  [address] [place], [id number]   ENDOSCOPY PROCEDURE REPORT  PATIENT: [name], [name]  MR#: [id number] BIRTHDATE: [birth year] , 55  yrs. old GENDER: female ENDOSCOPIST: Lafayette Dragon, MD REFERRED BY: Eldridge Abrahams NP PROCEDURE DATE:  06/07/2014 PROCEDURE:  EGD, diagnostic and Savary dilation of esophagus ASA CLASS:     Class II INDICATIONS:  heartburn, dysphagia, and history of esophageal stricture dilated in 2014 and 2008.  Currently taking Prilosec 20 mg twice a day. MEDICATIONS: Monitored anesthesia care and Propofol 100 mg IV TOPICAL ANESTHETIC: none  DESCRIPTION OF PROCEDURE: After the risks benefits and alternatives of the procedure were thoroughly explained, informed consent was obtained.  The LB [id number] endoscope was introduced through the mouth and advanced to the second portion of the duodenum , Without limitations.  The instrument was slowly withdrawn as the mucosa was fully examined.    Esophagus: proximal and mid esophageal mucosa was normal there was a short linear erosion 2 cm above the squamocolumnar junction the was a 5-7 mm long and had a negative date. There was also acute esophagitis at the GE junction associated with the benign-appearing distal esophageal stricture which was the moderately severe. The stricture allowed the endoscope to traverse into small hiatal hernia. Diameter of the stricture was about the 13-14 mm Stomach: gastric mucosa was normal. Gastric folds in gastric antrum were unremarkable. Pyloric outlet was normal. Retroflexion of the endoscope revealed normal fundus and cardia Duodenum: duodenal bulb and descending duodenum was normal Guidewire was then placed through the endoscope and Savary dilators passed over the guidewire through the stricture starting with the 13 mm, 14 mm, 15 mm, and 16 mm dilators , There was  small amount of blood on the last dilator. Patient tolerated procedure well[          The scope was then withdrawn from the patient and the procedure completed.  COMPLICATIONS: There were no immediate complications.  ENDOSCOPIC IMPRESSION: grade 2 reflux esophagitis Mild esophageal stricture. Status post dilation to 16 mm  RECOMMENDATIONS: 1.  Anti-reflux regimen to be follow 2.  Increase Prilosec to 40 mg twice a day Weight loss Dietary modifications  REPEAT EXAM: prn  eSigned:  Lafayette Dragon, MD 06/07/2014 2:41 PM    CC:  PATIENT NAME:  Ann [name], Ann [name] MR#: [id number]

## 2014-06-07 NOTE — Progress Notes (Signed)
Called to room to assist during endoscopic procedure.  Patient ID and intended procedure confirmed with present staff. Received instructions for my participation in the procedure from the performing physician.  

## 2014-06-08 ENCOUNTER — Telehealth: Payer: Self-pay | Admitting: *Deleted

## 2014-06-08 ENCOUNTER — Telehealth: Payer: Self-pay | Admitting: Internal Medicine

## 2014-06-08 MED ORDER — ESOMEPRAZOLE MAGNESIUM 40 MG PO CPDR: 40.0000 mg | DELAYED_RELEASE_CAPSULE | Freq: Two times a day (BID) | ORAL | Status: DC

## 2014-06-08 NOTE — Telephone Encounter (Signed)
I have advised patient of Dr Nichola Sizer recommendations and she verbalizes understanding. New Nexium rx sent to pharmacy.

## 2014-06-08 NOTE — Addendum Note (Signed)
Addended by: Larina Bras on: 06/08/2014 01:25 PM   Modules accepted: Orders, Medications

## 2014-06-08 NOTE — Telephone Encounter (Signed)
  Follow up Call-  Call back number 06/07/2014 08/13/2012  Post procedure Call Back phone  # [phone]  Permission to leave phone message Yes Yes     Patient questions:  Do you have a fever, pain , or abdominal swelling? No. Pain Score  0 *  Have you tolerated food without any problems? Yes.    Have you been able to return to your normal activities? Yes.    Do you have any questions about your discharge instructions: Diet   No. Medications  No. Follow up visit  No.  Do you have questions or concerns about your Care? No.  Actions: * If pain score is 4 or above: No action needed, pain <4.

## 2014-06-08 NOTE — Telephone Encounter (Signed)
OK so, we will have to go to Nexiem 40 mg po bid, #60, hope her insurance covers, if not then she will have to check with her insurance the preferred PPI. In the meantime, take Gaviscon OTC ,chew 2 tabs  Qid prn reflux.

## 2014-06-08 NOTE — Telephone Encounter (Signed)
Dr Brodie, please advise. 

## 2014-07-09 ENCOUNTER — Emergency Department (HOSPITAL_COMMUNITY)
Admission: EM | Admit: 2014-07-09 | Discharge: 2014-07-09 | Disposition: A | Discharge status: Home / Self Care | Payer: Medicaid Other | Attending: Emergency Medicine | Admitting: Emergency Medicine

## 2014-07-09 ENCOUNTER — Emergency Department (HOSPITAL_COMMUNITY): Payer: Medicaid Other

## 2014-07-09 ENCOUNTER — Encounter (HOSPITAL_COMMUNITY): Payer: Self-pay | Admitting: Emergency Medicine

## 2014-07-09 DIAGNOSIS — F419 Anxiety disorder, unspecified: Secondary | ICD-10-CM | POA: Diagnosis not present

## 2014-07-09 DIAGNOSIS — M1990 Unspecified osteoarthritis, unspecified site: Secondary | ICD-10-CM | POA: Diagnosis not present

## 2014-07-09 DIAGNOSIS — K088 Other specified disorders of teeth and supporting structures: Secondary | ICD-10-CM | POA: Insufficient documentation

## 2014-07-09 DIAGNOSIS — Z8639 Personal history of other endocrine, nutritional and metabolic disease: Secondary | ICD-10-CM | POA: Diagnosis not present

## 2014-07-09 DIAGNOSIS — Z8614 Personal history of Methicillin resistant Staphylococcus aureus infection: Secondary | ICD-10-CM | POA: Insufficient documentation

## 2014-07-09 DIAGNOSIS — Z8669 Personal history of other diseases of the nervous system and sense organs: Secondary | ICD-10-CM | POA: Insufficient documentation

## 2014-07-09 DIAGNOSIS — Z79899 Other long term (current) drug therapy: Secondary | ICD-10-CM | POA: Diagnosis not present

## 2014-07-09 DIAGNOSIS — Z8673 Personal history of transient ischemic attack (TIA), and cerebral infarction without residual deficits: Secondary | ICD-10-CM | POA: Insufficient documentation

## 2014-07-09 DIAGNOSIS — R51 Headache: Secondary | ICD-10-CM | POA: Diagnosis present

## 2014-07-09 DIAGNOSIS — K219 Gastro-esophageal reflux disease without esophagitis: Secondary | ICD-10-CM | POA: Diagnosis not present

## 2014-07-09 DIAGNOSIS — R519 Headache, unspecified: Secondary | ICD-10-CM

## 2014-07-09 DIAGNOSIS — Z87440 Personal history of urinary (tract) infections: Secondary | ICD-10-CM | POA: Insufficient documentation

## 2014-07-09 DIAGNOSIS — K0889 Other specified disorders of teeth and supporting structures: Secondary | ICD-10-CM

## 2014-07-09 DIAGNOSIS — Z7982 Long term (current) use of aspirin: Secondary | ICD-10-CM | POA: Insufficient documentation

## 2014-07-09 DIAGNOSIS — Z9981 Dependence on supplemental oxygen: Secondary | ICD-10-CM | POA: Insufficient documentation

## 2014-07-09 DIAGNOSIS — I10 Essential (primary) hypertension: Secondary | ICD-10-CM | POA: Insufficient documentation

## 2014-07-09 DIAGNOSIS — R319 Hematuria, unspecified: Secondary | ICD-10-CM | POA: Diagnosis not present

## 2014-07-09 LAB — URINALYSIS, ROUTINE W REFLEX MICROSCOPIC
BILIRUBIN URINE: NEGATIVE
Glucose, UA: NEGATIVE mg/dL
Ketones, ur: NEGATIVE mg/dL
Nitrite: NEGATIVE
Protein, ur: 100 mg/dL — AB
Specific Gravity, Urine: 1.021 (ref 1.005–1.030)
Urobilinogen, UA: 0.2 mg/dL (ref 0.0–1.0)
pH: 5.5 (ref 5.0–8.0)

## 2014-07-09 LAB — URINE MICROSCOPIC-ADD ON

## 2014-07-09 LAB — BASIC METABOLIC PANEL
ANION GAP: 5 (ref 5–15)
BUN: 15 mg/dL (ref 6–23)
CO2: 26 mmol/L (ref 19–32)
CREATININE: 1.18 mg/dL — AB (ref 0.50–1.10)
Calcium: 8.9 mg/dL (ref 8.4–10.5)
Chloride: 109 mEq/L (ref 96–112)
GFR calc Af Amer: 59 mL/min — ABNORMAL LOW (ref >90)
GFR calc non Af Amer: 51 mL/min — ABNORMAL LOW (ref >90)
Glucose, Bld: 99 mg/dL (ref 70–99)
POTASSIUM: 3.9 mmol/L (ref 3.5–5.1)
SODIUM: 140 mmol/L (ref 135–145)

## 2014-07-09 LAB — CBC WITH DIFFERENTIAL/PLATELET
BASOS PCT: 0 % (ref 0–1)
Basophils Absolute: 0 10*3/uL (ref 0.0–0.1)
EOS ABS: 0.2 10*3/uL (ref 0.0–0.7)
Eosinophils Relative: 4 % (ref 0–5)
HCT: 38 % (ref 36.0–46.0)
Hemoglobin: 12.1 g/dL (ref 12.0–15.0)
LYMPHS ABS: 1.9 10*3/uL (ref 0.7–4.0)
Lymphocytes Relative: 30 % (ref 12–46)
MCH: 29.6 pg (ref 26.0–34.0)
MCHC: 31.8 g/dL (ref 30.0–36.0)
MCV: 92.9 fL (ref 78.0–100.0)
Monocytes Absolute: 0.6 10*3/uL (ref 0.1–1.0)
Monocytes Relative: 10 % (ref 3–12)
NEUTROS PCT: 56 % (ref 43–77)
Neutro Abs: 3.6 10*3/uL (ref 1.7–7.7)
PLATELETS: 273 10*3/uL (ref 150–400)
RBC: 4.09 MIL/uL (ref 3.87–5.11)
RDW: 14.7 % (ref 11.5–15.5)
WBC: 6.5 10*3/uL (ref 4.0–10.5)

## 2014-07-09 MED ORDER — DIPHENHYDRAMINE HCL 50 MG/ML IJ SOLN
25.0000 mg | Freq: Once | INTRAMUSCULAR | Status: AC
  Administered 2014-07-09: 25 mg via INTRAVENOUS
  Filled 2014-07-09: qty 1

## 2014-07-09 MED ORDER — SODIUM CHLORIDE 0.9 % IV BOLUS (SEPSIS)
1000.0000 mL | Freq: Once | INTRAVENOUS | Status: AC
  Administered 2014-07-09: 1000 mL via INTRAVENOUS

## 2014-07-09 MED ORDER — METOCLOPRAMIDE HCL 5 MG/ML IJ SOLN
10.0000 mg | Freq: Once | INTRAMUSCULAR | Status: AC
  Administered 2014-07-09: 10 mg via INTRAVENOUS
  Filled 2014-07-09: qty 2

## 2014-07-09 MED ORDER — HYDROMORPHONE HCL 1 MG/ML IJ SOLN
1.0000 mg | Freq: Once | INTRAMUSCULAR | Status: AC
  Administered 2014-07-09: 1 mg via INTRAVENOUS
  Filled 2014-07-09: qty 1

## 2014-07-09 NOTE — ED Notes (Signed)
Pt c/o headache that has been having headache for 3 days and hasnt been able to sleep.  Pt states that she has some rotten teeth and been using several tube of ora-gel and dont know if that is related to her headache.  Pt also stating that yesterday she felt like something was in her left ear.

## 2014-07-09 NOTE — ED Notes (Signed)
Pt reports L side h/a with photosensitivity x 5 days, became severe the last 2-3 days.  Pt denies any n/v a this time.  Pt also reports bila lower toothache and is not sure if this is what's causing her h/a.  Pt is A&Ox 4.  No obvious neuro deficits noted at this time.

## 2014-07-09 NOTE — ED Notes (Signed)
Pt ambulated to the BR with steady gait.   

## 2014-07-09 NOTE — ED Provider Notes (Signed)
CSN: [id number]     Arrival date & time 07/09/14  1418 History   First MD Initiated Contact with Patient 07/09/14 1517     Chief Complaint  Patient presents with  . Headache     (Consider location/radiation/quality/duration/timing/severity/associated sxs/prior Treatment) HPI Comments: Patient with past medical history remarkable for hypertension, hyperlipidemia, TIA, anxiety, and migraines presents emergency department with chief complaint of multiple complaints.  1. Headache: Patient states that she has had a new, severe, worsening headache for the past 3 days. She states that it is throbbing in nature, and is located mostly in the temporal region. She is tried taking ibuprofen with no relief. She states that she used to have migraines prior to menopause, but states that she has not had any in many years. She denies any fevers chills. Denies any weakness, numbness, tingling, or ataxia.  2. Dental pain: Patient reports persistent right-sided dental pain times several weeks. She has been taking Orajel with minimal relief. She denies any associated fevers chills.  3. Hematuria: Patient reports having persistent hematuria following right ureteral stent placement. She states that she has persistent dysuria as well. She states that the hematuria has worsened. She complains of associated fatigue. She is followed by Dr. Gaynelle Arabian of [facility].  The history is provided by the patient. No language interpreter was used.    Past Medical History  Diagnosis Date  . Hypertension   . Arthritis   . GERD (gastroesophageal reflux disease)   . Hyperlipidemia   . History of recurrent UTIs   . H/O hiatal hernia   . History of esophageal dilatation   . History of TIAs NO RESIDUAL    1980;  2005;   2008 PT STATES PER CT  SCARRING RIGHT SIDE OF BRAIN  . Diverticulosis of colon   . SUI (stress urinary incontinence, female)   . Chronic cystitis   . OSA on CPAP     PER SLEEP STUDY 09-08-2012   MODERATE OSA  . Borderline diabetes   . Nocturia   . Anxiety   . Neuropathy   . Headache(784.0)     severe headaches   . Complication of anesthesia     02-20-2014 ([facility]) INTRA-OP RESPIRATORY FAILURE SECONDARY TO POSSIBLE MUCOUS ASPIRATION/  ALSO 06-06-2013 [facility]) POST-OP DESATURATION  EVEN USING CPAP  . Family history of adverse reaction to anesthesia     PER PT SISTER DIED AFTER GENERAL ANESTHESIA DUE TO UNDIAGNOSED OSA  . History of MRSA infection     3/ 2015  AXILLARY ABSCESS   Past Surgical History  Procedure Laterality Date  . Knee arthroscopy Bilateral 2008  . Esophageal dilation  X2  LAST ONE  JUNE 2014  . Tubal ligation  1987  . Anterior and posterior repair N/A 06/06/2013    Procedure: Anterior vaginal vault repair, Sacrospinous ligament fixation with UPHOLD lite, Kelly plication, Sacrospinous mesh fixation, Solyx transurethral sling;  Surgeon: Ailene Rud, MD;  Location: [facility];  Service: Urology;  Laterality: N/A;  . Rectocele repair N/A 02/20/2014    Procedure: POSTERIOR REPAIR (RECTOCELE) WITH VAGINAL VAULT REPAIR WITH ACELL GRAFT PLACEMENT, PERINEOPLASTY, ENTEROCELE REPAIR;  Surgeon: Ailene Rud, MD;  Location: [facility];  Service: Urology;  Laterality: N/A;  . Cystoscopy w/ ureteral stent placement Right 04/21/2014    Procedure: CYSTOSCOPY WITH RETROGRADE PYELOGRAM/URETERAL STENT PLACEMENT;  Surgeon: Ailene Rud, MD;  Location: [facility];  Service: Urology;  Laterality: Right;  . Cystoscopy with retrograde pyelogram, ureteroscopy and stent placement  Right 06/05/2014    Procedure: CYSTOSCOPY WITH RIGHT RETROGRADE PYELOGRAM, RIGHT URETEROSCOPY, STONE EXTRACTION AND RIGHT DOUBLE J STENT PLACEMENT;  Surgeon: Ailene Rud, MD;  Location: [facility];  Service: Urology;  Laterality: Right;  . Holmium laser application Right 37/85/8850    Procedure: HOLMIUM LASER OF STONE ;  Surgeon: Ailene Rud, MD;  Location:  [facility];  Service: Urology;  Laterality: Right;   Family History  Problem Relation Age of Onset  . Colon cancer Father   . Esophageal cancer Neg Hx   . Rectal cancer Neg Hx   . Stomach cancer Neg Hx   . Heart disease Mother    History  Substance Use Topics  . Smoking status: Never Smoker   . Smokeless tobacco: Never Used  . Alcohol Use: No   OB History    No data available     Review of Systems  Constitutional: Negative for fever and chills.  HENT: Positive for dental problem.   Respiratory: Negative for shortness of breath.   Cardiovascular: Negative for chest pain.  Gastrointestinal: Negative for nausea, vomiting, diarrhea and constipation.  Genitourinary: Positive for dysuria and hematuria.  Neurological: Positive for headaches.  All other systems reviewed and are negative.     Allergies  Review of patient's allergies indicates no known allergies.  Home Medications   Prior to Admission medications   Medication Sig Start Date End Date Taking? Authorizing Provider  albuterol (PROVENTIL HFA;VENTOLIN HFA) 108 (90 BASE) MCG/ACT inhaler Inhale 2 puffs into the lungs every 4 (four) hours as needed for wheezing. 08/01/12  Yes Adeline Saralyn Pilar, MD  amLODipine (NORVASC) 10 MG tablet Take 10 mg by mouth 2 (two) times daily.   Yes Historical Provider, MD  aspirin EC 81 MG tablet Take 81 mg by mouth daily.   Yes Historical Provider, MD  clonazePAM (KLONOPIN) 0.5 MG tablet Take 0.5 mg by mouth 3 (three) times daily as needed for anxiety.   Yes Historical Provider, MD  esomeprazole (NEXIUM) 40 MG capsule Take 1 capsule (40 mg total) by mouth 2 (two) times daily. 06/08/14  Yes Lafayette Dragon, MD  hydrocortisone (ANUSOL-HC) 2.5 % rectal cream Place 1 application rectally 2 (two) times daily as needed. With applicator 27/74/12  Yes Lafayette Dragon, MD  lisinopril (PRINIVIL,ZESTRIL) 20 MG tablet Take 20 mg by mouth at bedtime.    Yes Historical Provider, MD  Multiple  Vitamin (MULTIVITAMIN WITH MINERALS) TABS tablet Take 1 tablet by mouth daily.   Yes Historical Provider, MD  omeprazole (PRILOSEC) 20 MG capsule Take 20 mg by mouth daily.   Yes Historical Provider, MD  oxybutynin (DITROPAN) 5 MG tablet Use 3x/day for bladder spasms. Caution: dry eyes, dry mouth, constipation,  confusion Patient taking differently: Take 5 mg by mouth 3 (three) times daily. Use 3x/day for bladder spasms. Caution: dry eyes, dry mouth, constipation,  confusion 06/05/14  Yes Ailene Rud, MD  oxyCODONE-acetaminophen (ROXICET) 5-325 MG per tablet Take 1 tablet by mouth every 6 (six) hours as needed for severe pain. 06/05/14  Yes Ailene Rud, MD  polyethylene glycol (MIRALAX / GLYCOLAX) packet Take 17 g by mouth daily.   Yes Historical Provider, MD  rizatriptan (MAXALT-MLT) 10 MG disintegrating tablet Take 1 tablet (10 mg total) by mouth as needed for migraine. May repeat in 2 hours if needed 02/21/13  Yes Marcial Pacas, MD  topiramate (TOPAMAX) 50 MG tablet Take 50 mg by mouth 2 (two) times daily.  Yes Historical Provider, MD  furosemide (LASIX) 20 MG tablet Take 20 mg by mouth as needed for fluid.  02/07/14   Historical Provider, MD  phenazopyridine (PYRIDIUM) 200 MG tablet Take 1 tablet (200 mg total) by mouth 3 (three) times daily as needed for pain. Patient not taking: Reported on 06/07/2014 04/22/14   Ailene Rud, MD  phenazopyridine (PYRIDIUM) 200 MG tablet Take 1 tablet (200 mg total) by mouth 3 (three) times daily as needed for pain. Patient not taking: Reported on 06/07/2014 06/05/14   Ailene Rud, MD  promethazine (PHENERGAN) 12.5 MG tablet Take 1 tablet (12.5 mg total) by mouth every 8 (eight) hours as needed for nausea or vomiting. Patient not taking: Reported on 06/07/2014 09/27/13   Carlyle Basques, MD   BP 141/77 mmHg  Pulse 80  Temp(Src) 98.2 F (36.8 C) (Oral)  Resp 18  SpO2 96% Physical Exam  Constitutional: She is oriented to person,  place, and time. She appears well-developed and well-nourished.  HENT:  Head: Normocephalic and atraumatic.  Right Ear: External ear normal.  Left Ear: External ear normal.  Mouth/Throat:    Poor dentition throughout.  Affected tooth as diagrammed.  No signs of peritonsillar or tonsillar abscess.  No signs of gingival abscess. Oropharynx is clear and without exudates.  Uvula is midline.  Airway is intact. No signs of Ludwig's angina with palpation of oral and sublingual mucosa.   Eyes: Conjunctivae and EOM are normal. Pupils are equal, round, and reactive to light.  Neck: Normal range of motion. Neck supple.  No pain with neck flexion, no meningismus  Cardiovascular: Normal rate, regular rhythm and normal heart sounds.  Exam reveals no gallop and no friction rub.   No murmur heard. Pulmonary/Chest: Effort normal and breath sounds normal. No respiratory distress. She has no wheezes. She has no rales. She exhibits no tenderness.  Abdominal: Soft. She exhibits no distension and no mass. There is no tenderness. There is no rebound and no guarding.  Musculoskeletal: Normal range of motion. She exhibits no edema or tenderness.  Normal gait.  Neurological: She is alert and oriented to person, place, and time. She has normal reflexes.  CN 3-12 intact, normal finger to nose, no pronator drift, sensation and strength intact bilaterally.  Skin: Skin is warm and dry.  Psychiatric: She has a normal mood and affect. Her behavior is normal. Judgment and thought content normal.  Nursing note and vitals reviewed.   ED Course  Procedures (including critical care time) Results for orders placed or performed during the hospital encounter of 07/09/14  CBC with Differential  Result Value Ref Range   WBC 6.5 4.0 - 10.5 K/uL   RBC 4.09 3.87 - 5.11 MIL/uL   Hemoglobin 12.1 12.0 - 15.0 g/dL   HCT 38.0 36.0 - 46.0 %   MCV 92.9 78.0 - 100.0 fL   MCH 29.6 26.0 - 34.0 pg   MCHC 31.8 30.0 - 36.0 g/dL   RDW  14.7 11.5 - 15.5 %   Platelets 273 150 - 400 K/uL   Neutrophils Relative % 56 43 - 77 %   Neutro Abs 3.6 1.7 - 7.7 K/uL   Lymphocytes Relative 30 12 - 46 %   Lymphs Abs 1.9 0.7 - 4.0 K/uL   Monocytes Relative 10 3 - 12 %   Monocytes Absolute 0.6 0.1 - 1.0 K/uL   Eosinophils Relative 4 0 - 5 %   Eosinophils Absolute 0.2 0.0 - 0.7 K/uL   Basophils  Relative 0 0 - 1 %   Basophils Absolute 0.0 0.0 - 0.1 K/uL  Basic metabolic panel  Result Value Ref Range   Sodium 140 135 - 145 mmol/L   Potassium 3.9 3.5 - 5.1 mmol/L   Chloride 109 96 - 112 mEq/L   CO2 26 19 - 32 mmol/L   Glucose, Bld 99 70 - 99 mg/dL   BUN 15 6 - 23 mg/dL   Creatinine, Ser 1.18 (H) 0.50 - 1.10 mg/dL   Calcium 8.9 8.4 - 10.5 mg/dL   GFR calc non Af Amer 51 (L) >90 mL/min   GFR calc Af Amer 59 (L) >90 mL/min   Anion gap 5 5 - 15  Urinalysis, Routine w reflex microscopic  Result Value Ref Range   Color, Urine AMBER (A) YELLOW   APPearance CLOUDY (A) CLEAR   Specific Gravity, Urine 1.021 1.005 - 1.030   pH 5.5 5.0 - 8.0   Glucose, UA NEGATIVE NEGATIVE mg/dL   Hgb urine dipstick LARGE (A) NEGATIVE   Bilirubin Urine NEGATIVE NEGATIVE   Ketones, ur NEGATIVE NEGATIVE mg/dL   Protein, ur 100 (A) NEGATIVE mg/dL   Urobilinogen, UA 0.2 0.0 - 1.0 mg/dL   Nitrite NEGATIVE NEGATIVE   Leukocytes, UA MODERATE (A) NEGATIVE  Urine microscopic-add on  Result Value Ref Range   Squamous Epithelial / LPF RARE RARE   WBC, UA 7-10 <3 WBC/hpf   RBC / HPF TOO NUMEROUS TO COUNT <3 RBC/hpf   Bacteria, UA RARE RARE   Crystals CA OXALATE CRYSTALS (A) NEGATIVE   Ct Head Wo Contrast  07/09/2014   CLINICAL DATA:  Headache for 3 days.  EXAM: CT HEAD WITHOUT CONTRAST  TECHNIQUE: Contiguous axial images were obtained from the base of the skull through the vertex without intravenous contrast.  COMPARISON:  None.  FINDINGS: Bony calvarium appears intact. 11 mm ill-defined low density is noted in left basal ganglia most consistent with old lacunar  infarction. No mass effect or midline shift is noted. Ventricular size is within normal limits. There is no evidence of mass lesion, hemorrhage or acute infarction.  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 No acute intracranial abnormality seen.   Electronically Signed   By: Sabino Dick M.D.   On: 07/09/2014 16:23      EKG Interpretation None      MDM   Final diagnoses:  Headache  Pain, dental  Hematuria    Patient with multiple medical problems.  Mainly complaining of headache today.  Some concern as the patient has not had headache like this in many years.  She also has a history of TIAs.  It is possible that all of the patient's pain is coming from her dental problems, but give the severity of the headache, I will order a head CT.  Patient has also had persistent hematuria, I will check a UA and labs.  Will treat headache with benadryl and reglan.  5:14 PM Patient seen by and discussed with Dr. Aline Brochure.  Patient had dystonic reaction to reglan.  I will give some dilaudid.  Avoiding toradol because of renal issues and age.  If pain can be controlled will plan for discharge.    H/H is stable.  UA remarkable for blood in urine.  This baseline since renal stenting, but no evidence of infection.  Patient can follow-up with her urologist for these symptoms.  6:22 PM Feels well. DC home with specialist follow-up as needed.  Montine Circle, PA-C 07/09/14  [place], MD 07/09/14 2322

## 2014-07-09 NOTE — ED Notes (Signed)
Bed: WA19 Expected date:  Expected time:  Means of arrival:  Comments: 

## 2014-07-09 NOTE — Discharge Instructions (Signed)
Hematuria Hematuria is blood in your urine. It can be caused by a bladder infection, kidney infection, prostate infection, kidney stone, or cancer of your urinary tract. Infections can usually be treated with medicine, and a kidney stone usually will pass through your urine. If neither of these is the cause of your hematuria, further workup to find out the reason may be needed. It is very important that you tell your health care provider about any blood you see in your urine, even if the blood stops without treatment or happens without causing pain. Blood in your urine that happens and then stops and then happens again can be a symptom of a very serious condition. Also, pain is not a symptom in the initial stages of many urinary cancers. HOME CARE INSTRUCTIONS   Drink lots of fluid, 3-4 quarts a day. If you have been diagnosed with an infection, cranberry juice is especially recommended, in addition to large amounts of water.  Avoid caffeine, tea, and carbonated beverages because they tend to irritate the bladder.  Avoid alcohol because it may irritate the prostate.  Take all medicines as directed by your health care provider.  If you were prescribed an antibiotic medicine, finish it all even if you start to feel better.  If you have been diagnosed with a kidney stone, follow your health care provider's instructions regarding straining your urine to catch the stone.  Empty your bladder often. Avoid holding urine for long periods of time.  After a bowel movement, women should cleanse front to back. Use each tissue only once.  Empty your bladder before and after sexual intercourse if you are a female. SEEK MEDICAL CARE IF:  You develop back pain.  You have a fever.  You have a feeling of sickness in your stomach (nausea) or vomiting.  Your symptoms are not better in 3 days. Return sooner if you are getting worse. SEEK IMMEDIATE MEDICAL CARE IF:   You develop severe vomiting and are  unable to keep the medicine down.  You develop severe back or abdominal pain despite taking your medicines.  You begin passing a large amount of blood or clots in your urine.  You feel extremely weak or faint, or you pass out. MAKE SURE YOU:   Understand these instructions.  Will watch your condition.  Will get help right away if you are not doing well or get worse. Document Released: 06/23/2005 Document Revised: 11/07/2013 Document Reviewed: 02/21/2013 [facility] Patient Information 2015 [place], [place]. This information is not intended to replace advice given to you by your health care provider. Make sure you discuss any questions you have with your health care provider. Dental Pain A tooth ache may be caused by cavities (tooth decay). Cavities expose the nerve of the tooth to air and hot or cold temperatures. It may come from an infection or abscess (also called a boil or furuncle) around your tooth. It is also often caused by dental caries (tooth decay). This causes the pain you are having. DIAGNOSIS  Your caregiver can diagnose this problem by exam. TREATMENT   If caused by an infection, it may be treated with medications which kill germs (antibiotics) and pain medications as prescribed by your caregiver. Take medications as directed.  Only take over-the-counter or prescription medicines for pain, discomfort, or fever as directed by your caregiver.  Whether the tooth ache today is caused by infection or dental disease, you should see your dentist as soon as possible for further care. [place]  IF: The exam and treatment you received today has been provided on an emergency basis only. This is not a substitute for complete medical or dental care. If your problem worsens or new problems (symptoms) appear, and you are unable to meet with your dentist, call or return to this location. SEEK IMMEDIATE MEDICAL CARE IF:   You have a fever.  You develop redness and swelling of your  face, jaw, or neck.  You are unable to open your mouth.  You have severe pain uncontrolled by pain medicine. MAKE SURE YOU:   Understand these instructions.  Will watch your condition.  Will get help right away if you are not doing well or get worse. Document Released: 06/23/2005 Document Revised: 09/15/2011 Document Reviewed: 02/09/2008 [facility] Patient Information 2015 [place], [place]. This information is not intended to replace advice given to you by your health care provider. Make sure you discuss any questions you have with your health care provider. General Headache Without Cause A headache is pain or discomfort felt around the head or neck area. The specific cause of a headache may not be found. There are many causes and types of headaches. A few common ones are:  Tension headaches.  Migraine headaches.  Cluster headaches.  Chronic daily headaches. HOME CARE INSTRUCTIONS   Keep all follow-up appointments with your caregiver or any specialist referral.  Only take over-the-counter or prescription medicines for pain or discomfort as directed by your caregiver.  Lie down in a dark, quiet room when you have a headache.  Keep a headache journal to find out what may trigger your migraine headaches. For example, write down:  What you eat and drink.  How much sleep you get.  Any change to your diet or medicines.  Try massage or other relaxation techniques.  Put ice packs or heat on the head and neck. Use these 3 to 4 times per day for 15 to 20 minutes each time, or as needed.  Limit stress.  Sit up straight, and do not tense your muscles.  Quit smoking if you smoke.  Limit alcohol use.  Decrease the amount of caffeine you drink, or stop drinking caffeine.  Eat and sleep on a regular schedule.  Get 7 to 9 hours of sleep, or as recommended by your caregiver.  Keep lights dim if bright lights bother you and make your headaches worse. SEEK MEDICAL CARE IF:   You  have problems with the medicines you were prescribed.  Your medicines are not working.  You have a change from the usual headache.  You have nausea or vomiting. SEEK IMMEDIATE MEDICAL CARE IF:   Your headache becomes severe.  You have a fever.  You have a stiff neck.  You have loss of vision.  You have muscular weakness or loss of muscle control.  You start losing your balance or have trouble walking.  You feel faint or pass out.  You have severe symptoms that are different from your first symptoms. MAKE SURE YOU:   Understand these instructions.  Will watch your condition.  Will get help right away if you are not doing well or get worse. Document Released: 06/23/2005 Document Revised: 09/15/2011 Document Reviewed: 07/09/2011 [facility] Patient Information 2015 [place], [place]. This information is not intended to replace advice given to you by your health care provider. Make sure you discuss any questions you have with your health care provider.

## 2014-07-09 NOTE — ED Notes (Signed)
Pt reports reglan made h/a worse.

## 2014-07-17 ENCOUNTER — Encounter: Payer: Self-pay | Admitting: *Deleted

## 2014-07-17 NOTE — Progress Notes (Signed)
Patient ID: Ann [name], female   DOB: 21-Sep-1955, 59 y.o.   MRN: [id number]     58 y.o. referred for chest pain  CRF's include HTN and elevated lipids.  Recent cysto and urethral stent  Also esophageal  Stretching by Dr Olevia Perches on multiple occassions She describes SSCP end of last year. Clearly improved after esophageal Rx and being placed on proton pump inhibitor.  Activity limited by bilateral knee pain.  SSCP could last Hours not exertional associated with some nausea at times.  No history of GB disease Had no cardiac issues with GU/GI surgeries.  Due to dysphagia difficulty has lost 30lbs This past year.  Obesity runs in family.  Currently no SSCP at all      ROS: Denies fever, malais, weight loss, blurry vision, decreased visual acuity, cough, sputum, SOB, hemoptysis, pleuritic pain, palpitaitons, heartburn, abdominal pain, melena, lower extremity edema, claudication, or rash.  All other systems reviewed and negative   General: Affect appropriate Obese white female  HEENT: normal Neck supple with no adenopathy JVP normal no bruits no thyromegaly Lungs clear with no wheezing and good diaphragmatic motion Heart:  S1/S2 no murmur,rub, gallop or click PMI normal Abdomen: benighn, BS positve, no tenderness, no AAA no bruit.  No HSM or HJR Distal pulses intact with no bruits No edema Neuro non-focal Skin warm and dry No muscular weakness  Medications Current Outpatient Prescriptions  Medication Sig Dispense Refill  . albuterol (PROVENTIL HFA;VENTOLIN HFA) 108 (90 BASE) MCG/ACT inhaler Inhale 2 puffs into the lungs every 4 (four) hours as needed for wheezing. 1 Inhaler 2  . amLODipine (NORVASC) 10 MG tablet Take 10 mg by mouth 2 (two) times daily.    Marland Kitchen aspirin EC 81 MG tablet Take 81 mg by mouth daily.    . clonazePAM (KLONOPIN) 0.5 MG tablet Take 0.5 mg by mouth 3 (three) times daily as needed for anxiety.    Marland Kitchen esomeprazole (NEXIUM) 40 MG capsule Take 1 capsule (40 mg total) by  mouth 2 (two) times daily. 180 capsule 0  . furosemide (LASIX) 20 MG tablet Take 20 mg by mouth as needed for fluid.     . hydrocortisone (ANUSOL-HC) 2.5 % rectal cream Place 1 application rectally 2 (two) times daily as needed. With applicator 30 g 1  . lisinopril (PRINIVIL,ZESTRIL) 20 MG tablet Take 20 mg by mouth at bedtime.     . Multiple Vitamin (MULTIVITAMIN WITH MINERALS) TABS tablet Take 1 tablet by mouth daily.    Marland Kitchen omeprazole (PRILOSEC) 20 MG capsule Take 20 mg by mouth daily.    Marland Kitchen oxybutynin (DITROPAN) 5 MG tablet Use 3x/day for bladder spasms. Caution: dry eyes, dry mouth, constipation,  confusion (Patient taking differently: Take 5 mg by mouth 3 (three) times daily. Use 3x/day for bladder spasms. Caution: dry eyes, dry mouth, constipation,  confusion) 30 tablet 3  . oxyCODONE-acetaminophen (ROXICET) 5-325 MG per tablet Take 1 tablet by mouth every 6 (six) hours as needed for severe pain. 30 tablet 0  . phenazopyridine (PYRIDIUM) 200 MG tablet Take 1 tablet (200 mg total) by mouth 3 (three) times daily as needed for pain. (Patient not taking: Reported on 06/07/2014) 10 tablet 0  . phenazopyridine (PYRIDIUM) 200 MG tablet Take 1 tablet (200 mg total) by mouth 3 (three) times daily as needed for pain. (Patient not taking: Reported on 06/07/2014) 30 tablet 5  . polyethylene glycol (MIRALAX / GLYCOLAX) packet Take 17 g by mouth daily.    . promethazine (  PHENERGAN) 12.5 MG tablet Take 1 tablet (12.5 mg total) by mouth every 8 (eight) hours as needed for nausea or vomiting. (Patient not taking: Reported on 06/07/2014) 30 tablet 0  . rizatriptan (MAXALT-MLT) 10 MG disintegrating tablet Take 1 tablet (10 mg total) by mouth as needed for migraine. May repeat in 2 hours if needed 15 tablet 6  . topiramate (TOPAMAX) 50 MG tablet Take 50 mg by mouth 2 (two) times daily.     No current facility-administered medications for this visit.    Allergies Review of patient's allergies indicates no known  allergies.  Family History: Family History  Problem Relation Age of Onset  . Colon cancer Father   . Esophageal cancer Neg Hx   . Rectal cancer Neg Hx   . Stomach cancer Neg Hx   . Heart disease Mother     Social History: History   Social History  . Marital Status: Married    Spouse Name: Herbie Baltimore    Number of Children: 3  . Years of Education: 12   Occupational History  . sales    Social History Main Topics  . Smoking status: Never Smoker   . Smokeless tobacco: Never Used  . Alcohol Use: No  . Drug Use: No  . Sexual Activity: No   Other Topics Concern  . Not on file   Social History Narrative   Patient lives at home with her husband 32 years. Herbie Baltimore). Patient has a 12th grade education.    Right handed.    Caffeine - two daily.    Past Surgical History  Procedure Laterality Date  . Knee arthroscopy Bilateral 2008  . Esophageal dilation  X2  LAST ONE  JUNE 2014  . Tubal ligation  1987  . Anterior and posterior repair N/A 06/06/2013    Procedure: Anterior vaginal vault repair, Sacrospinous ligament fixation with UPHOLD lite, Kelly plication, Sacrospinous mesh fixation, Solyx transurethral sling;  Surgeon: Ailene Rud, MD;  [facility];  Service: Urology;  Laterality: N/A;  . Rectocele repair N/A 02/20/2014    Procedure: POSTERIOR REPAIR (RECTOCELE) WITH VAGINAL VAULT REPAIR WITH ACELL GRAFT PLACEMENT, PERINEOPLASTY, ENTEROCELE REPAIR;  Surgeon: Ailene Rud, MD;  Location: [facility];  Service: Urology;  Laterality: N/A;  . Cystoscopy w/ ureteral stent placement Right 04/21/2014    Procedure: CYSTOSCOPY WITH RETROGRADE PYELOGRAM/URETERAL STENT PLACEMENT;  Surgeon: Ailene Rud, MD;  Location: [facility];  Service: Urology;  Laterality: Right;  . Cystoscopy with retrograde pyelogram, ureteroscopy and stent placement Right 06/05/2014    Procedure: CYSTOSCOPY WITH RIGHT RETROGRADE PYELOGRAM, RIGHT URETEROSCOPY, STONE EXTRACTION  AND RIGHT DOUBLE J STENT PLACEMENT;  Surgeon: Ailene Rud, MD;  Location: [facility];  Service: Urology;  Laterality: Right;  . Holmium laser application Right [id number]/32/7614    Procedure: HOLMIUM LASER OF STONE ;  Surgeon: Ailene Rud, MD;  Location: [facility];  Service: Urology;  Laterality: Right;    Past Medical History  Diagnosis Date  . Hypertension   . Arthritis   . GERD (gastroesophageal reflux disease)   . Hyperlipidemia   . History of recurrent UTIs   . H/O hiatal hernia   . History of esophageal dilatation   . History of TIAs NO RESIDUAL    1980;  2005;   2008 PT STATES PER CT  SCARRING RIGHT SIDE OF BRAIN  . Diverticulosis of colon   . SUI (stress urinary incontinence, female)   . Chronic cystitis   .  OSA on CPAP     PER SLEEP STUDY 09-08-2012  MODERATE OSA  . Borderline diabetes   . Nocturia   . Anxiety   . Neuropathy   . Headache(784.0)     severe headaches   . Complication of anesthesia     02-20-2014 ([facility]) INTRA-OP RESPIRATORY FAILURE SECONDARY TO POSSIBLE MUCOUS ASPIRATION/  ALSO 06-06-2013 [facility]) POST-OP DESATURATION  EVEN USING CPAP  . Family history of adverse reaction to anesthesia     PER PT SISTER DIED AFTER GENERAL ANESTHESIA DUE TO UNDIAGNOSED OSA  . History of MRSA infection     3/ 2015  AXILLARY ABSCESS    Electrocardiogram:  SR rate 89 normal 8/15   Assessment and Plan

## 2014-07-18 ENCOUNTER — Encounter: Payer: Self-pay | Admitting: Cardiovascular Disease

## 2014-07-18 ENCOUNTER — Ambulatory Visit (INDEPENDENT_AMBULATORY_CARE_PROVIDER_SITE_OTHER): Payer: Medicaid Other | Admitting: Cardiovascular Disease

## 2014-07-18 VITALS — BP 112/82 | HR 75 | Ht 62.0 in | Wt 206.4 lb

## 2014-07-18 DIAGNOSIS — E785 Hyperlipidemia, unspecified: Secondary | ICD-10-CM | POA: Insufficient documentation

## 2014-07-18 DIAGNOSIS — R079 Chest pain, unspecified: Secondary | ICD-10-CM | POA: Insufficient documentation

## 2014-07-18 DIAGNOSIS — R072 Precordial pain: Secondary | ICD-10-CM

## 2014-07-18 DIAGNOSIS — I10 Essential (primary) hypertension: Secondary | ICD-10-CM

## 2014-07-18 NOTE — Assessment & Plan Note (Signed)
Well controlled.  Continue current medications and low sodium Dash type diet.    

## 2014-07-18 NOTE — Patient Instructions (Signed)
Your physician recommends that you schedule a follow-up appointment in: AS NEEDED  Your physician recommends that you continue on your current medications as directed. Please refer to the Current Medication list given to you today.  

## 2014-07-18 NOTE — Assessment & Plan Note (Signed)
Atypical resolved after Rx for GERD and stricture  ECG normal Exam normal Has had previously normal chemical stress tests  Observe and consider lixiscan if symptoms recur.  Continue risk factor modification HTN and lipids  Knee pain prevents her from having simple ETT

## 2014-07-18 NOTE — Assessment & Plan Note (Signed)
Cholesterol is at goal.  Continue current dose of statin and diet Rx.  No myalgias or side effects.  F/U  LFT's in 6 months. No results found for: [facility] with primary

## 2014-07-28 ENCOUNTER — Other Ambulatory Visit: Payer: Self-pay | Admitting: Internal Medicine

## 2014-10-23 ENCOUNTER — Telehealth: Payer: Self-pay | Admitting: Internal Medicine

## 2014-10-23 NOTE — Telephone Encounter (Signed)
Spoke with patient and she states she is having problems with dysphagia again. States yesterday was the worse in 2 weeks. She vomited so violently that she had some bright, red blood in it. She is taking Prilosec BID, eating soft foods, chewing well and eating upright. She does not feel she needs to go to ED at this time but will go if worsens. Offered OV with extender later in week but she refuses. She wants Dr. Nichola Sizer opinion only.

## 2014-10-23 NOTE — Telephone Encounter (Signed)
I assume she is still on Prilosec 40 mg po bid. If not, then she should go back on it. If she is on it, then add Ranitidine 150 mg po in the mid day for additional acid suppression.

## 2014-10-24 NOTE — Telephone Encounter (Signed)
Spoke with patient and she is taking Prilosec 40 mg BID. She sometimes takes Ranitidine 150 mg mid day but she will try taking it routinely.

## 2014-11-21 ENCOUNTER — Ambulatory Visit (HOSPITAL_COMMUNITY)
Admission: RE | Admit: 2014-11-21 | Discharge: 2014-11-21 | Disposition: A | Discharge status: Home / Self Care | Payer: Medicaid Other | Source: Ambulatory Visit | Attending: Urology | Admitting: Urology

## 2014-11-21 ENCOUNTER — Encounter (HOSPITAL_COMMUNITY): Payer: Self-pay | Admitting: *Deleted

## 2014-11-21 ENCOUNTER — Ambulatory Visit (HOSPITAL_COMMUNITY): Payer: Medicaid Other | Admitting: Anesthesiology

## 2014-11-21 ENCOUNTER — Other Ambulatory Visit: Payer: Self-pay | Admitting: Urology

## 2014-11-21 ENCOUNTER — Encounter (HOSPITAL_COMMUNITY)
Admission: RE | Disposition: A | Discharge status: Home / Self Care | Payer: Self-pay | Source: Ambulatory Visit | Attending: Urology

## 2014-11-21 DIAGNOSIS — G4733 Obstructive sleep apnea (adult) (pediatric): Secondary | ICD-10-CM | POA: Insufficient documentation

## 2014-11-21 DIAGNOSIS — N132 Hydronephrosis with renal and ureteral calculous obstruction: Secondary | ICD-10-CM | POA: Diagnosis not present

## 2014-11-21 DIAGNOSIS — F419 Anxiety disorder, unspecified: Secondary | ICD-10-CM | POA: Diagnosis not present

## 2014-11-21 DIAGNOSIS — E559 Vitamin D deficiency, unspecified: Secondary | ICD-10-CM | POA: Insufficient documentation

## 2014-11-21 DIAGNOSIS — Z8673 Personal history of transient ischemic attack (TIA), and cerebral infarction without residual deficits: Secondary | ICD-10-CM | POA: Diagnosis not present

## 2014-11-21 DIAGNOSIS — K219 Gastro-esophageal reflux disease without esophagitis: Secondary | ICD-10-CM | POA: Insufficient documentation

## 2014-11-21 DIAGNOSIS — Z9851 Tubal ligation status: Secondary | ICD-10-CM | POA: Diagnosis not present

## 2014-11-21 DIAGNOSIS — Z8614 Personal history of Methicillin resistant Staphylococcus aureus infection: Secondary | ICD-10-CM | POA: Diagnosis not present

## 2014-11-21 DIAGNOSIS — F1590 Other stimulant use, unspecified, uncomplicated: Secondary | ICD-10-CM | POA: Diagnosis not present

## 2014-11-21 DIAGNOSIS — E669 Obesity, unspecified: Secondary | ICD-10-CM | POA: Diagnosis not present

## 2014-11-21 DIAGNOSIS — I10 Essential (primary) hypertension: Secondary | ICD-10-CM | POA: Diagnosis not present

## 2014-11-21 DIAGNOSIS — J45909 Unspecified asthma, uncomplicated: Secondary | ICD-10-CM | POA: Diagnosis not present

## 2014-11-21 DIAGNOSIS — E785 Hyperlipidemia, unspecified: Secondary | ICD-10-CM | POA: Diagnosis not present

## 2014-11-21 DIAGNOSIS — M1990 Unspecified osteoarthritis, unspecified site: Secondary | ICD-10-CM | POA: Diagnosis not present

## 2014-11-21 DIAGNOSIS — Z6838 Body mass index (BMI) 38.0-38.9, adult: Secondary | ICD-10-CM | POA: Diagnosis not present

## 2014-11-21 DIAGNOSIS — Z7982 Long term (current) use of aspirin: Secondary | ICD-10-CM | POA: Insufficient documentation

## 2014-11-21 HISTORY — PX: CYSTOSCOPY W/ URETERAL STENT PLACEMENT: SHX1429

## 2014-11-21 LAB — BASIC METABOLIC PANEL
ANION GAP: 10 (ref 5–15)
BUN: 21 mg/dL — ABNORMAL HIGH (ref 6–20)
CALCIUM: 9 mg/dL (ref 8.9–10.3)
CHLORIDE: 108 mmol/L (ref 101–111)
CO2: 24 mmol/L (ref 22–32)
Creatinine, Ser: 1.27 mg/dL — ABNORMAL HIGH (ref 0.44–1.00)
GFR calc Af Amer: 54 mL/min — ABNORMAL LOW (ref >60)
GFR calc non Af Amer: 46 mL/min — ABNORMAL LOW (ref >60)
GLUCOSE: 96 mg/dL (ref 65–99)
Potassium: 3.9 mmol/L (ref 3.5–5.1)
Sodium: 142 mmol/L (ref 135–145)

## 2014-11-21 LAB — CBC
HCT: 42.4 % (ref 36.0–46.0)
Hemoglobin: 13.4 g/dL (ref 12.0–15.0)
MCH: 30.2 pg (ref 26.0–34.0)
MCHC: 31.6 g/dL (ref 30.0–36.0)
MCV: 95.7 fL (ref 78.0–100.0)
PLATELETS: 343 10*3/uL (ref 150–400)
RBC: 4.43 MIL/uL (ref 3.87–5.11)
RDW: 14.2 % (ref 11.5–15.5)
WBC: 8.2 10*3/uL (ref 4.0–10.5)

## 2014-11-21 SURGERY — CYSTOSCOPY, WITH RETROGRADE PYELOGRAM AND URETERAL STENT INSERTION
Anesthesia: General | Laterality: Right

## 2014-11-21 MED ORDER — ONDANSETRON HCL 4 MG/2ML IJ SOLN
INTRAMUSCULAR | Status: AC
  Filled 2014-11-21: qty 2

## 2014-11-21 MED ORDER — 0.9 % SODIUM CHLORIDE (POUR BTL) OPTIME
TOPICAL | Status: DC | PRN
  Administered 2014-11-21: 1000 mL

## 2014-11-21 MED ORDER — LIDOCAINE HCL (CARDIAC) 20 MG/ML IV SOLN
INTRAVENOUS | Status: DC | PRN
  Administered 2014-11-21: 100 mg via INTRAVENOUS

## 2014-11-21 MED ORDER — KETOROLAC TROMETHAMINE 30 MG/ML IJ SOLN
INTRAMUSCULAR | Status: DC | PRN
  Administered 2014-11-21: 30 mg via INTRAVENOUS

## 2014-11-21 MED ORDER — ONDANSETRON HCL 4 MG/2ML IJ SOLN
INTRAMUSCULAR | Status: DC | PRN
  Administered 2014-11-21: 4 mg via INTRAVENOUS

## 2014-11-21 MED ORDER — SUCCINYLCHOLINE CHLORIDE 20 MG/ML IJ SOLN
INTRAMUSCULAR | Status: DC | PRN
  Administered 2014-11-21: 100 mg via INTRAVENOUS

## 2014-11-21 MED ORDER — ACETAMINOPHEN 10 MG/ML IV SOLN
1000.0000 mg | Freq: Once | INTRAVENOUS | Status: AC
  Administered 2014-11-21: 1000 mg via INTRAVENOUS
  Filled 2014-11-21: qty 100

## 2014-11-21 MED ORDER — LIDOCAINE HCL (CARDIAC) 20 MG/ML IV SOLN
INTRAVENOUS | Status: AC
  Filled 2014-11-21: qty 5

## 2014-11-21 MED ORDER — FENTANYL CITRATE (PF) 100 MCG/2ML IJ SOLN: 25.0000 ug | INTRAMUSCULAR | Status: DC | PRN

## 2014-11-21 MED ORDER — BELLADONNA ALKALOIDS-OPIUM 16.2-60 MG RE SUPP
RECTAL | Status: AC
  Filled 2014-11-21: qty 1

## 2014-11-21 MED ORDER — FENTANYL CITRATE (PF) 100 MCG/2ML IJ SOLN
INTRAMUSCULAR | Status: AC
  Filled 2014-11-21: qty 2

## 2014-11-21 MED ORDER — BELLADONNA ALKALOIDS-OPIUM 16.2-60 MG RE SUPP
RECTAL | Status: DC | PRN
  Administered 2014-11-21: 1 via RECTAL

## 2014-11-21 MED ORDER — LIDOCAINE HCL 2 % EX GEL
CUTANEOUS | Status: AC
  Filled 2014-11-21: qty 10

## 2014-11-21 MED ORDER — SODIUM CHLORIDE 0.9 % IR SOLN
Status: DC | PRN
  Administered 2014-11-21: 4000 mL

## 2014-11-21 MED ORDER — DEXAMETHASONE SODIUM PHOSPHATE 10 MG/ML IJ SOLN
INTRAMUSCULAR | Status: AC
  Filled 2014-11-21: qty 1

## 2014-11-21 MED ORDER — OXYCODONE-ACETAMINOPHEN 5-325 MG PO TABS: 1.0000 | ORAL_TABLET | Freq: Four times a day (QID) | ORAL | Status: DC | PRN

## 2014-11-21 MED ORDER — PROMETHAZINE HCL 25 MG/ML IJ SOLN
6.2500 mg | INTRAMUSCULAR | Status: DC | PRN
  Administered 2014-11-21: 6.25 mg via INTRAVENOUS

## 2014-11-21 MED ORDER — FENTANYL CITRATE (PF) 100 MCG/2ML IJ SOLN
INTRAMUSCULAR | Status: DC | PRN
  Administered 2014-11-21: 100 ug via INTRAVENOUS

## 2014-11-21 MED ORDER — IOHEXOL 300 MG/ML  SOLN
INTRAMUSCULAR | Status: DC | PRN
  Administered 2014-11-21: 20 mL

## 2014-11-21 MED ORDER — CEFAZOLIN SODIUM-DEXTROSE 2-3 GM-% IV SOLR
2.0000 g | Freq: Once | INTRAVENOUS | Status: AC
  Administered 2014-11-21: 2 g via INTRAVENOUS

## 2014-11-21 MED ORDER — CEFAZOLIN SODIUM-DEXTROSE 2-3 GM-% IV SOLR
INTRAVENOUS | Status: AC
  Filled 2014-11-21: qty 50

## 2014-11-21 MED ORDER — DEXAMETHASONE SODIUM PHOSPHATE 10 MG/ML IJ SOLN
INTRAMUSCULAR | Status: DC | PRN
  Administered 2014-11-21: 10 mg via INTRAVENOUS

## 2014-11-21 MED ORDER — LACTATED RINGERS IV SOLN
INTRAVENOUS | Status: DC | PRN
  Administered 2014-11-21: 20:00:00 via INTRAVENOUS

## 2014-11-21 MED ORDER — TRIMETHOPRIM 100 MG PO TABS: 100.0000 mg | ORAL_TABLET | ORAL | Status: DC

## 2014-11-21 MED ORDER — PROPOFOL 10 MG/ML IV BOLUS
INTRAVENOUS | Status: DC | PRN
  Administered 2014-11-21: 180 mg via INTRAVENOUS

## 2014-11-21 MED ORDER — PROMETHAZINE HCL 25 MG/ML IJ SOLN
INTRAMUSCULAR | Status: AC
  Filled 2014-11-21: qty 1

## 2014-11-21 MED ORDER — PROPOFOL 10 MG/ML IV BOLUS
INTRAVENOUS | Status: AC
  Filled 2014-11-21: qty 20

## 2014-11-21 SURGICAL SUPPLY — 19 items
BAG URO CATCHER STRL LF (DRAPE) ×3 IMPLANT
CATH INTERMIT  6FR 70CM (CATHETERS) ×3 IMPLANT
CLOTH BEACON ORANGE TIMEOUT ST (SAFETY) ×3 IMPLANT
EXTRACTOR STONE NITINOL NGAGE (UROLOGICAL SUPPLIES) ×3 IMPLANT
FIBER LASER FLEXIVA 1000 (UROLOGICAL SUPPLIES) IMPLANT
FIBER LASER FLEXIVA 200 (UROLOGICAL SUPPLIES) IMPLANT
FIBER LASER FLEXIVA 365 (UROLOGICAL SUPPLIES) IMPLANT
FIBER LASER FLEXIVA 550 (UROLOGICAL SUPPLIES) IMPLANT
FIBER LASER TRAC TIP (UROLOGICAL SUPPLIES) IMPLANT
GLOVE BIOGEL M STRL SZ7.5 (GLOVE) ×3 IMPLANT
GOWN STRL REUS W/TWL XL LVL3 (GOWN DISPOSABLE) ×3 IMPLANT
GUIDEWIRE STR DUAL SENSOR (WIRE) ×3 IMPLANT
MANIFOLD NEPTUNE II (INSTRUMENTS) ×3 IMPLANT
NS IRRIG 1000ML POUR BTL (IV SOLUTION) ×3 IMPLANT
PACK CYSTO (CUSTOM PROCEDURE TRAY) ×3 IMPLANT
SCRUB PCMX 4 OZ (MISCELLANEOUS) ×3 IMPLANT
STENT PERCUFLEX 4.8FRX24 (STENTS) ×3 IMPLANT
TUBING CONNECTING 10 (TUBING) ×2 IMPLANT
TUBING CONNECTING 10' (TUBING) ×1

## 2014-11-21 NOTE — Discharge Instructions (Signed)
Hydronephrosis Hydronephrosis is an abnormal enlargement of your kidney. It can affect one or both the kidneys. It results from the backward pressure of urine on the kidneys, when the flow of urine is blocked. Normally, the urine drains from the kidney through the urine tube (ureter), into a sac which holds the urine until urination (bladder). When the urinary flow is blocked, the urine collects above the block. This causes an increase in the pressure inside the kidney, which in turn leads to its enlargement. The block can occur at the point where the kidney joins the ureter. Treatment depends on the cause and location of the block.  CAUSES  The causes of this condition include:  Birth defect of the kidney or ureter.  Kink at the point where the kidney joins the ureter.  Stones and blood clots in the kidney or ureter.  Cancer, injury, or infection of the ureter.  Scar tissue formation.  Backflow of urine (reflux).  Cancer of bladder or prostate gland.  Abnormality of the nerves or muscles of the kidney or ureter.  Lower part of the ureter protruding into the bladder (ureterocele).  Abnormal contractions of the bladder.  Both the kidneys can be affected during pregnancy. This is because the enlarging uterus presses on the ureters and blocks the flow of urine. SYMPTOMS  The symptoms depend on the location of the block. They also depend on how long the block has been present. You may feel pain on the affected side. Sometimes, you may not have any symptoms. There may be a dull ache or discomfort in the flank. The common symptoms are:  Flank pain.  Swelling of the abdomen.  Pain in the abdomen.  Nausea and vomiting.  Fever.  Pain while passing urine.  Urgency for urination.  Frequent or urgent urination.  Infection of the urinary tract. DIAGNOSIS  Your caregiver will examine you after asking about your symptoms. You may be asked to do blood and urine tests. Your caregiver  may order a special X-ray, ultrasound, or CT scan. Sometimes a rigid or flexible telescope (cystoscope) is used to view the site of the blockage.  TREATMENT  Treatment depends on the site, cause, and duration of the block. The goal of treatment is to remove the blockage. Your caregiver will plan the treatment based on your condition. The different types of treatment are:   Putting in a soft plastic tube (ureteral stent) to connect the bladder with the kidney. This will help in draining the urine.  Putting in a soft tube (nephrostomy tube). This is placed through skin into the kidney. The trapped urine is drained out through the back. A plastic bag is attached to your skin to hold the urine that has drained out.  Antibiotics to treat or prevent infection.  Breaking down of the stone (lithotripsy). HOME CARE INSTRUCTIONS   It may take some time for the hydronephrosis to go away (resolve). Drink fluids as directed by your caregiver , and get a lot of rest.  If you have a drain in, your caregiver will give you directions about how to care for it. Be sure you understand these directions completely before you go home.  Take any antibiotics, pain medications, or other prescriptions exactly as prescribed.  Follow-up with your caregivers as directed. SEEK MEDICAL CARE IF:   You continue to have flank pain, nausea, or difficulty with urination.  You have any problem with any type of drainage device.  Your urine becomes cloudy or bloody. SEEK  IMMEDIATE MEDICAL CARE IF:   You have severe flank and/or abdominal pain.  You develop vomiting and are unable to hold down fluids.  You develop a fever above 100.5 F (38.1 C), or as per your caregiver. MAKE SURE YOU:   Understand these instructions.  Will watch your condition.  Will get help right away if you are not doing well or get worse. Document Released: 04/20/2007 Document Revised: 09/15/2011 Document Reviewed: 06/06/2010 [facility]  Patient Information 2015 [place], [place]. This information is not intended to replace advice given to you by your health care provider. Make sure you discuss any questions you have with your health care provider.  Dietary Guidelines to Help Prevent Kidney Stones Your risk of kidney stones can be decreased by adjusting the foods you eat. The most important thing you can do is drink enough fluid. You should drink enough fluid to keep your urine clear or pale yellow. The following guidelines provide specific information for the type of kidney stone you have had. GUIDELINES ACCORDING TO TYPE OF KIDNEY STONE Calcium Oxalate Kidney Stones  Reduce the amount of salt you eat. Foods that have a lot of salt cause your body to release excess calcium into your urine. The excess calcium can combine with a substance called oxalate to form kidney stones.  Reduce the amount of animal protein you eat if the amount you eat is excessive. Animal protein causes your body to release excess calcium into your urine. Ask your dietitian how much protein from animal sources you should be eating.  Avoid foods that are high in oxalates. If you take vitamins, they should have less than 500 mg of vitamin C. Your body turns vitamin C into oxalates. You do not need to avoid fruits and vegetables high in vitamin C. Calcium Phosphate Kidney Stones  Reduce the amount of salt you eat to help prevent the release of excess calcium into your urine.  Reduce the amount of animal protein you eat if the amount you eat is excessive. Animal protein causes your body to release excess calcium into your urine. Ask your dietitian how much protein from animal sources you should be eating.  Get enough calcium from food or take a calcium supplement (ask your dietitian for recommendations). Food sources of calcium that do not increase your risk of kidney stones include:  Broccoli.  Dairy products, such as cheese and yogurt.  Pudding. Uric Acid  Kidney Stones  Do not have more than 6 oz of animal protein per day. FOOD SOURCES Animal Protein Sources  Meat (all types).  Poultry.  Eggs.  Fish, seafood. Foods High in [employer] seasonings.  Soy sauce.  Teriyaki sauce.  Cured and processed meats.  Salted crackers and snack foods.  Fast food.  Canned soups and most canned foods. Foods High in Oxalates  Grains:  Amaranth.  Barley.  Grits.  Wheat germ.  Bran.  Buckwheat flour.  All bran cereals.  Pretzels.  Whole wheat bread.  Vegetables:  Beans (wax).  Beets and beet greens.  Collard greens.  Eggplant.  Escarole.  Leeks.  Okra.  Parsley.  Rutabagas.  Spinach.  Swiss chard.  Tomato paste.  Fried potatoes.  Sweet potatoes.  Fruits:  Red currants.  Figs.  Kiwi.  Rhubarb.  Meat and Other Protein Sources:  Beans (dried).  Soy burgers and other soybean products.  Miso.  Nuts (peanuts, almonds, pecans, cashews, hazelnuts).  Nut butters.  Sesame seeds and tahini (paste made of sesame seeds).  Poppy  seeds.  Beverages:  Chocolate drink mixes.  Soy milk.  Instant iced tea.  Juices made from high-oxalate fruits or vegetables.  Other:  Carob.  Chocolate.  Fruitcake.  Marmalades. Document Released: 10/18/2010 Document Revised: 06/28/2013 Document Reviewed: 05/20/2013 [facility] Patient Information 2015 [place], [place]. This information is not intended to replace advice given to you by your health care provider. Make sure you discuss any questions you have with your health care provider. Remove JJ stent next Monday.

## 2014-11-21 NOTE — Anesthesia Postprocedure Evaluation (Signed)
  Anesthesia Post-op Note  Patient: Ann [name]  Procedure(s) Performed: Procedure(s) (LRB): CYSTOSCOPY WITH RETROGRADE PYELOGRAM, URETEROSCOPY WITH STONE REMOVAL, URETERAL STENT PLACEMENT (Right)  Patient Location: PACU  Anesthesia Type: General  Level of Consciousness: awake and alert   Airway and Oxygen Therapy: Patient Spontanous Breathing  Post-op Pain: mild  Post-op Assessment: Post-op Vital signs reviewed, Patient's Cardiovascular Status Stable, Respiratory Function Stable, Patent Airway and No signs of Nausea or vomiting  Last Vitals:  Filed Vitals:   11/21/14 2140  BP:   Pulse: 85  Temp:   Resp: 20    Post-op Vital Signs: stable   Complications: No apparent anesthesia complications. Some coughing, but she says chronic coughing is normal for her. No dyspnea. Feels fine. Ready for discharge.

## 2014-11-21 NOTE — Op Note (Signed)
Pre-operative diagnosis :   Impacted right mid ureteral calculus, 3 mm with proximal Ider process, and renal colic  Postoperative diagnosis:  Same  Operation:  Cystourethroscopy, right [place] pyelogram interpretation, right ureteroscopy, basket extraction right midureteral calculus, 3 mm, right double-J stent  Surgeon:  S. Gaynelle Arabian, MD  First assistant:  None  Anesthesia:  General LMA  Preparation:  After appropriate anesthesia, the patient was brought the operating room, placed on the operating table in dorsal supine position where general LMA anesthesia was introduced. She was then replaced in the dorsal lithotomy position with the pubis was prepped with Betadine solution and draped in usual fashion. The x-rays were reviewed. The arm band was double checked. The patient was marked in the right arm, and the right flank.  Review history:  1. Calculus of right ureter (N20.1)  Assessed By: Jimmey Ralph (Urology); Last Assessed: 21 Nov 2014 2. Hydronephrosis, right (N13.30)  Assessed By: Jimmey Ralph (Urology); Last Assessed: 21 Nov 2014  History of Present Illness 59 YO female patient of Dr. Arlyn Leak seen today for f/u right hydronephrosis.     GU hx:  Jan 2016 cysto/Rt JJ stent removal. She is s/p cysto/Rt RPG/Rt ureteroscopy/laser of Rt ureteral stone/Rt JJ stent on 06/05/14. She is also s/p cysto/Rt RPG/Rt JJ stent placement on 04/21/14.    She was seen by Dr. Risa Grill on 04/20/14 as an acute work and with a newly diagnosed large right ureteral stone and hydronephrosis. S/P rectocele and enterocele surgery by Dr. Gaynelle Arabian Nov 2015. Apparently that surgery was complicated by some anesthetic complications with a question of aspiration.     May 2016 Interval Hx:   Today states she redeveloped right flank pain 5 days ago. Was seen by PCP and had CT urogram which showed she had right hydronephrosis and hydroureter to level of mid 3 mm right ureteral calculus. There also  appears she could also have stricture at this time. Pain today is so bad she states "it is making me nauseous, and I feel fainty." Denis f/c or vomiting. Has been self treating with Motrin w/o relief.   Past Medical History Problems  1. History of Anxiety (F41.9) 2. History of arthritis (Z87.39) 3. History of gastroesophageal reflux (GERD) (Z87.19) 4. History of hiatal hernia (Z87.19) 5. History of hyperlipidemia (Z86.39) 6. History of hypertension (Z86.79) 7. History of methicillin resistant Staphylococcus aureus infection (Z86.14) 8. History of neuropathy (Z86.69) 9. History of stroke (Z86.73) 10. History of Obstructive sleep apnea, adult (G47.33) 11. History of Vitamin D deficiency (E55.9)  Surgical History  Statement of  Likelihood of Success: Excellent. TIME-OUT observed.:  Procedure:  Cystoscopy and urethroscopy was, showing normal-appearing bladder base. The trigone was normal. Right retrograde powder form, which showed marked right hydronephrosis. I was unsure of the location of the stone, but reference to the CT scan shows 3 mm right midureteral calculus. A guidewire was passed into the right ureter, and coiled in the right renal pelvis. The open-ended catheter was removed, and the short semirigid dual-chamber ureteroscope was passed into the right ureter, and into the mid ureter. Stone was notified, and using the engage basket, the stone was engaged, and removed. Repeat cystoscopy was as, and repeat ureteroscopy was a copper's, to the level of the renal pelvis. No further stones were identified. I elected to leave a double-J stent, and a 4.8 x 22 cm double-J stent was selected area that was passed over the 0.038 guidewire, into the renal pelvis, and coiled at the level of the  ureteral orifice. Suture was left intact, and taped to the pubis. The bladder was drained of fluid. The patient was given IV Tylenol and IV Toradol. She was awakened and taken to recovery room in good condition.  She received a B and O suppository at the beginning of the procedure. She also received IV Ancef.

## 2014-11-21 NOTE — Anesthesia Preprocedure Evaluation (Addendum)
Anesthesia Evaluation  Patient identified by MRN, date of birth, ID band Patient awake    Reviewed: Allergy & Precautions, NPO status , Patient's Chart, lab work & pertinent test results  History of Anesthesia Complications (+) Family history of anesthesia reaction and history of anesthetic complications  Airway Mallampati: II  TM Distance: >3 FB Neck ROM: Full    Dental no notable dental hx.    Pulmonary asthma , sleep apnea and Continuous Positive Airway Pressure Ventilation ,  breath sounds clear to auscultation  Pulmonary exam normal       Cardiovascular hypertension, Pt. on medications Normal cardiovascular examRhythm:Regular Rate:Normal     Neuro/Psych  Headaches, Anxiety  Neuromuscular disease    GI/Hepatic negative GI ROS, Neg liver ROS, hiatal hernia, GERD-  Medicated,  Endo/Other  negative endocrine ROS  Renal/GU Renal disease  negative genitourinary   Musculoskeletal  (+) Arthritis -, Osteoarthritis,    Abdominal (+) + obese,   Peds negative pediatric ROS (+)  Hematology negative hematology ROS (+)   Anesthesia Other Findings   Reproductive/Obstetrics negative OB ROS                            Anesthesia Physical Anesthesia Plan  ASA: III and emergent  Anesthesia Plan: General   Post-op Pain Management:    Induction: Intravenous  Airway Management Planned: Oral ETT  Additional Equipment:   Intra-op Plan:   Post-operative Plan: Extubation in OR  Informed Consent: I have reviewed the patients History and Physical, chart, labs and discussed the procedure including the risks, benefits and alternatives for the proposed anesthesia with the patient or authorized representative who has indicated his/her understanding and acceptance.   Dental advisory given  Plan Discussed with: CRNA  Anesthesia Plan Comments: (Has aspirated under  LMA in the past. Plan ETT)        Anesthesia Quick Evaluation

## 2014-11-21 NOTE — Transfer of Care (Signed)
Immediate Anesthesia Transfer of Care Note  Patient: Ann [name]  Procedure(s) Performed: Procedure(s): CYSTOSCOPY WITH RETROGRADE PYELOGRAM, URETEROSCOPY WITH STONE REMOVAL, URETERAL STENT PLACEMENT (Right)  Patient Location: PACU  Anesthesia Type:General  Level of Consciousness: awake, alert  and oriented  Airway & Oxygen Therapy: Patient Spontanous Breathing and Patient connected to face mask oxygen  Post-op Assessment: Report given to RN and Post -op Vital signs reviewed and stable  Post vital signs: Reviewed and stable  Last Vitals:  Filed Vitals:   11/21/14 1604  BP: 131/77  Pulse: 79  Temp: 36.8 C  Resp: 16    Complications: No apparent anesthesia complications

## 2014-11-21 NOTE — Interval H&P Note (Signed)
History and Physical Interval Note:  11/21/2014 7:46 PM  Ann [name]  has presented today for surgery, with the diagnosis of right ureteral stone  The various methods of treatment have been discussed with the patient and family. After consideration of risks, benefits and other options for treatment, the patient has consented to  Procedure(s): CYSTOSCOPY WITH RETROGRADE PYELOGRAM/URETERAL STENT PLACEMENT (Right) as a surgical intervention .  The patient's history has been reviewed, patient examined, no change in status, stable for surgery.  I have reviewed the patient's chart and labs.  Questions were answered to the patient's satisfaction.   Pt seen and examined. Cheerfull, no apparent pain.    Ann [name] I Ann [name]

## 2014-11-21 NOTE — H&P (Signed)
Reason For Visit Seen today for a f/u of right hydronephrosis   Active Problems Problems  1. Calculus of right ureter (N20.1)   Assessed By: Jimmey Ralph (Urology); Last Assessed: 21 Nov 2014 2. Hydronephrosis, right (N13.30)   Assessed By: Jimmey Ralph (Urology); Last Assessed: 21 Nov 2014  History of Present Illness 59 YO female patient of Dr. Arlyn Leak seen today for f/u right hydronephrosis.     GU hx:  Jan 2016 cysto/Rt JJ stent removal. She is s/p cysto/Rt RPG/Rt ureteroscopy/laser of Rt ureteral stone/Rt JJ stent on 06/05/14. She is also s/p cysto/Rt RPG/Rt JJ stent placement on 04/21/14.     She was seen by Dr. Risa Grill on 04/20/14 as an acute work and with a newly diagnosed large right ureteral stone and hydronephrosis. S/P rectocele and enterocele surgery by Dr. Gaynelle Arabian Nov 2015. Apparently that surgery was complicated by some anesthetic complications with a question of aspiration.     May 2016 Interval Hx:   Today states she redeveloped right flank pain 5 days ago. Was seen by PCP and had CT urogram which showed she had right hydronephrosis and hydroureter to level of mid 3 mm right ureteral calculus. There also appears she could also have stricture at this time. Pain today is so bad she states "it is making me nauseous, and I feel fainty." Denis f/c or vomiting. Has been self treating with Motrin w/o relief.   Past Medical History Problems  1. History of Anxiety (F41.9) 2. History of arthritis (Z87.39) 3. History of gastroesophageal reflux (GERD) (Z87.19) 4. History of hiatal hernia (Z87.19) 5. History of hyperlipidemia (Z86.39) 6. History of hypertension (Z86.79) 7. History of methicillin resistant Staphylococcus aureus infection (Z86.14) 8. History of neuropathy (Z86.69) 9. History of stroke (Z86.73) 10. History of Obstructive sleep apnea, adult (G47.33) 11. History of Vitamin D deficiency (E55.9)  Surgical History Problems  1. History of Anterior  Colporrhaphy, Repair Of Cystocele 2. History of Cystoscopy With Insertion Of Ureteral Stent Right 3. History of Cystoscopy With Insertion Of Ureteral Stent Right 4. History of Cystoscopy With Ureteroscopy With Lithotripsy 5. History of Enterocele Repair, Vag. Approach (Sep Procedure) 6. History of Esophageal Dilation 7. History of Esophageal Dilation 8. History of Knee Arthroscopy 9. History of Mid-Urethral Sling Operation 10. History of Posterior Colporrhaphy (For Pelvic Relaxation) 11. History of Sacrospinous Ligament Fixation For Posthysterectomy Prolapse 12. History of Tubal Ligation 13. History of Vaginal Surg Insertion Of Prosthesis For Pelvic Floor Repair  Current Meds 1. AmLODIPine Besylate 10 MG Oral Tablet;  Therapy: (Recorded:27Oct2014) to Recorded 2. Aspirin 81 MG TABS;  Therapy: (Recorded:27Oct2014) to Recorded 3. Cipro TABS;  Therapy: (Recorded:15Oct2015) to Recorded 4. ClonazePAM TABS;  Therapy: (Recorded:15Oct2015) to Recorded 5. Furosemide TABS;  Therapy: (Recorded:15Oct2015) to Recorded 6. Nortriptyline HCl - 10 MG Oral Capsule;  Therapy: (Recorded:27Oct2014) to Recorded 7. Ondansetron HCl - 4 MG Oral Tablet; TAKE 1 TABLET Every 6 hours PRN;  Therapy: [phone] to (Last Rx:15Oct2015)  Requested for: 15Oct2015 Ordered 8. Oxybutynin Chloride 5 MG Oral Tablet; TAKE 1 TABLET BY MOUTH THREE TIMES DAILY  FOR BLADDER SPASMS;  Therapy: [id number] to (Evaluate:11Jan2016)  Requested for: 02Dec2015; Last  Rx:02Dec2015 Ordered 9. Polyethylene Glycol 3350 Oral Powder; MIX ONE CAPFUL IN 8 OUNCES OF WATER,  JUICE OR TEA AND DRINK DAILY;  Therapy: 03Sep2015 to (Last Rx:03Sep2015)  Requested for: 08Sep2015 Ordered 10. Prevacid 24HR 15 MG Oral Capsule Delayed Release;   Therapy: (Recorded:15Oct2015) to Recorded 11. Senokot TABS;   Therapy: (Recorded:25Aug2015) to Recorded 12. Topiramate  50 MG Oral Tablet;   Therapy: (Recorded:27Oct2014) to Recorded 13. Uribel 118 MG Oral  Capsule; TAKE 1 CAPSULE 4 times daily PRN;   Therapy: 28Oct2015 to (Last Rx:28Oct2015)  Requested for: 28Oct2015 Ordered 14. VESIcare 5 MG Oral Tablet; TAKE ONE TABLET DAILY;   Therapy: 30Oct2015 to (Evaluate:27Feb2016)  Requested for: 30Oct2015; Last   Rx:30Oct2015 Ordered  Allergies Medication  1. No Known Drug Allergies  Family History Problems  1. Family history of Deceased : Father, Mother 2. Family history of cardiac disorder (Z82.49) : Mother 3. Family history of malignant neoplasm (Z80.9) : Father 4. Family history of tuberculosis (Z8.1)  Social History Problems  1. Alcohol use 2. Caffeine use (F15.90)   1 per day 3. Married 4. Never smoker 5. No alcohol use 6. Number of children   1 son, 2 daughters  Review of Systems Genitourinary, constitutional, skin, eye, otolaryngeal, hematologic/lymphatic, cardiovascular, pulmonary, endocrine, musculoskeletal, gastrointestinal, neurological and psychiatric system(s) were reviewed and pertinent findings if present are noted and are otherwise negative.  Gastrointestinal: nausea and flank pain.  Neurological: lightheadedness.    Vitals Vital Signs [Data Includes: Last 1 Day]  Recorded: [id number] 01:27PM  Weight: 210 lb  BMI Calculated: 36.05 BSA Calculated: 2 Blood Pressure: 134 / 92 Temperature: 97.4 F Heart Rate: 73  Physical Exam Constitutional: Well nourished and well developed . No acute distress. The patient appears well hydrated.  ENT:. The ears and nose are normal in appearance.  Neck: The appearance of the neck is normal.  Pulmonary: No respiratory distress.  Cardiovascular: Heart rate and rhythm are normal.  Abdomen: The abdomen is obese. The abdomen is soft and nontender. No tenderness in the RLQ and no LLQ tenderness. No right CVA tenderness and moderate left CVA tenderness.  Skin: Normal skin turgor.  Neuro/Psych:. Mood and affect are appropriate.    Results/Data Urine [Data Includes: Last 1 Day]    [id number]  COLOR YELLOW   APPEARANCE CLEAR   SPECIFIC GRAVITY 1.025   pH 6.0   GLUCOSE NEG mg/dL  BILIRUBIN NEG   KETONE NEG mg/dL  BLOOD NEG   PROTEIN NEG mg/dL  UROBILINOGEN 0.2 mg/dL  NITRITE NEG   LEUKOCYTE ESTERASE NEG    The following clinical lab reports were reviewed:  UA: negative.  The following radiology reports were reviewed: I personally reviewed CT urogram from 11/17/14 which showed moderate right hydronephrosis and hydroureter to level of small right ureteral calculus.    Assessment Assessed  1. Calculus of right ureter (N20.1) 2. Hydronephrosis, right (N13.30)  Plan Health Maintenance  1. UA With REFLEX; [Do Not Release]; Status:Complete;   Done: [id number] 01:19PM  Pt demands something be done today for discomfort. Discussed with Dr. Gaynelle Arabian and will proceed with cystourethroscopy, R RPG, possible stone extraction, and placement of double J stent. Risks and benefits discussed of general anesthesia, hematuria, infection, and possible bladder/ureter injury. She DEMANDS TO PROCEED.   Signatures Electronically signed by : Jimmey Ralph, Trey Paula; Nov 21 2014  2:18PM EST

## 2014-11-21 NOTE — Anesthesia Procedure Notes (Signed)
Procedure Name: Intubation Date/Time: 11/21/2014 8:32 PM Performed by: Danley Danker L Patient Re-evaluated:Patient Re-evaluated prior to inductionOxygen Delivery Method: Circle system utilized Preoxygenation: Pre-oxygenation with 100% oxygen Intubation Type: IV induction, Rapid sequence and Cricoid Pressure applied Laryngoscope Size: Miller and 2 Grade View: Grade I Tube type: Oral Tube size: 7.5 mm Number of attempts: 1 Airway Equipment and Method: Stylet Placement Confirmation: ETT inserted through vocal cords under direct vision,  positive ETCO2 and breath sounds checked- equal and bilateral Secured at: 21 cm Tube secured with: Tape Dental Injury: Teeth and Oropharynx as per pre-operative assessment

## 2014-11-22 ENCOUNTER — Encounter (HOSPITAL_COMMUNITY): Payer: Self-pay | Admitting: Urology

## 2015-02-09 ENCOUNTER — Encounter (HOSPITAL_COMMUNITY): Payer: Self-pay | Admitting: Emergency Medicine

## 2015-02-09 ENCOUNTER — Emergency Department (HOSPITAL_COMMUNITY): Payer: Medicaid Other

## 2015-02-09 ENCOUNTER — Emergency Department (HOSPITAL_COMMUNITY)
Admission: EM | Admit: 2015-02-09 | Discharge: 2015-02-09 | Disposition: A | Discharge status: Home / Self Care | Payer: Medicaid Other | Attending: Emergency Medicine | Admitting: Emergency Medicine

## 2015-02-09 DIAGNOSIS — Z9981 Dependence on supplemental oxygen: Secondary | ICD-10-CM | POA: Insufficient documentation

## 2015-02-09 DIAGNOSIS — K5732 Diverticulitis of large intestine without perforation or abscess without bleeding: Secondary | ICD-10-CM | POA: Diagnosis not present

## 2015-02-09 DIAGNOSIS — K219 Gastro-esophageal reflux disease without esophagitis: Secondary | ICD-10-CM | POA: Diagnosis not present

## 2015-02-09 DIAGNOSIS — Z79899 Other long term (current) drug therapy: Secondary | ICD-10-CM | POA: Insufficient documentation

## 2015-02-09 DIAGNOSIS — Z7982 Long term (current) use of aspirin: Secondary | ICD-10-CM | POA: Diagnosis not present

## 2015-02-09 DIAGNOSIS — Z8614 Personal history of Methicillin resistant Staphylococcus aureus infection: Secondary | ICD-10-CM | POA: Insufficient documentation

## 2015-02-09 DIAGNOSIS — I10 Essential (primary) hypertension: Secondary | ICD-10-CM | POA: Insufficient documentation

## 2015-02-09 DIAGNOSIS — R05 Cough: Secondary | ICD-10-CM | POA: Diagnosis present

## 2015-02-09 DIAGNOSIS — J209 Acute bronchitis, unspecified: Secondary | ICD-10-CM | POA: Diagnosis not present

## 2015-02-09 DIAGNOSIS — G629 Polyneuropathy, unspecified: Secondary | ICD-10-CM | POA: Insufficient documentation

## 2015-02-09 DIAGNOSIS — Z87440 Personal history of urinary (tract) infections: Secondary | ICD-10-CM | POA: Diagnosis not present

## 2015-02-09 DIAGNOSIS — G4733 Obstructive sleep apnea (adult) (pediatric): Secondary | ICD-10-CM | POA: Insufficient documentation

## 2015-02-09 DIAGNOSIS — Z8639 Personal history of other endocrine, nutritional and metabolic disease: Secondary | ICD-10-CM | POA: Insufficient documentation

## 2015-02-09 DIAGNOSIS — M1990 Unspecified osteoarthritis, unspecified site: Secondary | ICD-10-CM | POA: Diagnosis not present

## 2015-02-09 DIAGNOSIS — R059 Cough, unspecified: Secondary | ICD-10-CM

## 2015-02-09 LAB — CBC WITH DIFFERENTIAL/PLATELET
BASOS ABS: 0 10*3/uL (ref 0.0–0.1)
Basophils Relative: 0 % (ref 0–1)
EOS ABS: 0.2 10*3/uL (ref 0.0–0.7)
Eosinophils Relative: 3 % (ref 0–5)
HCT: 42.6 % (ref 36.0–46.0)
Hemoglobin: 13.8 g/dL (ref 12.0–15.0)
LYMPHS ABS: 1.8 10*3/uL (ref 0.7–4.0)
Lymphocytes Relative: 22 % (ref 12–46)
MCH: 31.2 pg (ref 26.0–34.0)
MCHC: 32.4 g/dL (ref 30.0–36.0)
MCV: 96.2 fL (ref 78.0–100.0)
MONOS PCT: 7 % (ref 3–12)
Monocytes Absolute: 0.6 10*3/uL (ref 0.1–1.0)
Neutro Abs: 5.5 10*3/uL (ref 1.7–7.7)
Neutrophils Relative %: 68 % (ref 43–77)
Platelets: 423 10*3/uL — ABNORMAL HIGH (ref 150–400)
RBC: 4.43 MIL/uL (ref 3.87–5.11)
RDW: 14 % (ref 11.5–15.5)
WBC: 8 10*3/uL (ref 4.0–10.5)

## 2015-02-09 LAB — URINALYSIS, ROUTINE W REFLEX MICROSCOPIC
Bilirubin Urine: NEGATIVE
Glucose, UA: NEGATIVE mg/dL
HGB URINE DIPSTICK: NEGATIVE
Ketones, ur: NEGATIVE mg/dL
LEUKOCYTES UA: NEGATIVE
NITRITE: NEGATIVE
PROTEIN: NEGATIVE mg/dL
Specific Gravity, Urine: 1.024 (ref 1.005–1.030)
Urobilinogen, UA: 0.2 mg/dL (ref 0.0–1.0)
pH: 7 (ref 5.0–8.0)

## 2015-02-09 LAB — COMPREHENSIVE METABOLIC PANEL
ALBUMIN: 3.3 g/dL — AB (ref 3.5–5.0)
ALK PHOS: 77 U/L (ref 38–126)
ALT: 19 U/L (ref 14–54)
AST: 26 U/L (ref 15–41)
Anion gap: 10 (ref 5–15)
BILIRUBIN TOTAL: 0.5 mg/dL (ref 0.3–1.2)
BUN: 12 mg/dL (ref 6–20)
CHLORIDE: 107 mmol/L (ref 101–111)
CO2: 22 mmol/L (ref 22–32)
Calcium: 9.1 mg/dL (ref 8.9–10.3)
Creatinine, Ser: 0.93 mg/dL (ref 0.44–1.00)
GFR calc Af Amer: >60 mL/min (ref >60)
Glucose, Bld: 108 mg/dL — ABNORMAL HIGH (ref 65–99)
Potassium: 4.3 mmol/L (ref 3.5–5.1)
Sodium: 139 mmol/L (ref 135–145)
Total Protein: 7.7 g/dL (ref 6.5–8.1)

## 2015-02-09 LAB — I-STAT CG4 LACTIC ACID, ED: LACTIC ACID, VENOUS: 1.35 mmol/L (ref 0.5–2.0)

## 2015-02-09 MED ORDER — KETOROLAC TROMETHAMINE 30 MG/ML IJ SOLN
30.0000 mg | Freq: Once | INTRAMUSCULAR | Status: AC
  Administered 2015-02-09: 30 mg via INTRAVENOUS
  Filled 2015-02-09: qty 1

## 2015-02-09 MED ORDER — MOXIFLOXACIN HCL 400 MG PO TABS: 400.0000 mg | ORAL_TABLET | Freq: Every day | ORAL | Status: DC

## 2015-02-09 MED ORDER — BENZONATATE 100 MG PO CAPS
100.0000 mg | ORAL_CAPSULE | Freq: Once | ORAL | Status: AC
  Administered 2015-02-09: 100 mg via ORAL
  Filled 2015-02-09: qty 1

## 2015-02-09 NOTE — ED Notes (Signed)
Pt seen at PCP on Monday-diagnosed with diverticulitis and bronchitis. Started on Cipro and Flagyl. Also sent home with Duoneb-last Duoneb was at 1100 today. Taking antibiotics as prescribed. Rates mid-sternal chest pain 10/10 described as "someone sitting on my chest." Also says, "I feel like my bladder is going to fall out when I cough-there's so much pressure down there." Patient has congested cough-reports greens sputum. Extensive surgical history. No other c/c. RR even, slightly labored.

## 2015-02-09 NOTE — ED Provider Notes (Signed)
ECG interpretation   Date: 02/09/2015  Rate: 79  Rhythm: normal sinus rhythm  QRS Axis: normal  Intervals: normal  ST/T Wave abnormalities: normal  Conduction Disutrbances: none  Narrative Interpretation:   Old EKG Reviewed: No significant changes noted     Jola Schmidt, MD 02/09/15 1659

## 2015-02-09 NOTE — Discharge Instructions (Signed)
Acute Bronchitis Bronchitis is inflammation of the airways that extend from the windpipe into the lungs (bronchi). The inflammation often causes mucus to develop. This leads to a cough, which is the most common symptom of bronchitis.  In acute bronchitis, the condition usually develops suddenly and goes away over time, usually in a couple weeks. Smoking, allergies, and asthma can make bronchitis worse. Repeated episodes of bronchitis may cause further lung problems.  CAUSES Acute bronchitis is most often caused by the same virus that causes a cold. The virus can spread from person to person (contagious) through coughing, sneezing, and touching contaminated objects. SIGNS AND SYMPTOMS   Cough.   Fever.   Coughing up mucus.   Body aches.   Chest congestion.   Chills.   Shortness of breath.   Sore throat.  DIAGNOSIS  Acute bronchitis is usually diagnosed through a physical exam. Your health care provider will also ask you questions about your medical history. Tests, such as chest X-rays, are sometimes done to rule out other conditions.  TREATMENT  Acute bronchitis usually goes away in a couple weeks. Oftentimes, no medical treatment is necessary. Medicines are sometimes given for relief of fever or cough. Antibiotic medicines are usually not needed but may be prescribed in certain situations. In some cases, an inhaler may be recommended to help reduce shortness of breath and control the cough. A cool mist vaporizer may also be used to help thin bronchial secretions and make it easier to clear the chest.  HOME CARE INSTRUCTIONS  Get plenty of rest.   Drink enough fluids to keep your urine clear or pale yellow (unless you have a medical condition that requires fluid restriction). Increasing fluids may help thin your respiratory secretions (sputum) and reduce chest congestion, and it will prevent dehydration.   Take medicines only as directed by your health care provider.  If  you were prescribed an antibiotic medicine, finish it all even if you start to feel better.  Avoid smoking and secondhand smoke. Exposure to cigarette smoke or irritating chemicals will make bronchitis worse. If you are a smoker, consider using nicotine gum or skin patches to help control withdrawal symptoms. Quitting smoking will help your lungs heal faster.   Reduce the chances of another bout of acute bronchitis by washing your hands frequently, avoiding people with cold symptoms, and trying not to touch your hands to your mouth, nose, or eyes.   Keep all follow-up visits as directed by your health care provider.  SEEK MEDICAL CARE IF: Your symptoms do not improve after 1 week of treatment.  SEEK IMMEDIATE MEDICAL CARE IF:  You develop an increased fever or chills.   You have chest pain.   You have severe shortness of breath.  You have bloody sputum.   You develop dehydration.  You faint or repeatedly feel like you are going to pass out.  You develop repeated vomiting.  You develop a severe headache. MAKE SURE YOU:   Understand these instructions.  Will watch your condition.  Will get help right away if you are not doing well or get worse. Document Released: 07/31/2004 Document Revised: 11/07/2013 Document Reviewed: 12/14/2012 [facility] Patient Information 2015 [place], [place]. This information is not intended to replace advice given to you by your health care provider. Make sure you discuss any questions you have with your health care provider. Diverticulitis Diverticulitis is inflammation or infection of small pouches in your colon that form when you have a condition called diverticulosis. The pouches in  your colon are called diverticula. Your colon, or large intestine, is where water is absorbed and stool is formed. Complications of diverticulitis can include:  Bleeding.  Severe infection.  Severe pain.  Perforation of your colon.  Obstruction of your  colon. CAUSES  Diverticulitis is caused by bacteria. Diverticulitis happens when stool becomes trapped in diverticula. This allows bacteria to grow in the diverticula, which can lead to inflammation and infection. RISK FACTORS People with diverticulosis are at risk for diverticulitis. Eating a diet that does not include enough fiber from fruits and vegetables may make diverticulitis more likely to develop. SYMPTOMS  Symptoms of diverticulitis may include:  Abdominal pain and tenderness. The pain is normally located on the left side of the abdomen, but may occur in other areas.  Fever and chills.  Bloating.  Cramping.  Nausea.  Vomiting.  Constipation.  Diarrhea.  Blood in your stool. DIAGNOSIS  Your health care provider will ask you about your medical history and do a physical exam. You may need to have tests done because many medical conditions can cause the same symptoms as diverticulitis. Tests may include:  Blood tests.  Urine tests.  Imaging tests of the abdomen, including X-rays and CT scans. When your condition is under control, your health care provider may recommend that you have a colonoscopy. A colonoscopy can show how severe your diverticula are and whether something else is causing your symptoms. TREATMENT  Most cases of diverticulitis are mild and can be treated at home. Treatment may include:  Taking over-the-counter pain medicines.  Following a clear liquid diet.  Taking antibiotic medicines by mouth for 7-10 days. More severe cases may be treated at a hospital. Treatment may include:  Not eating or drinking.  Taking prescription pain medicine.  Receiving antibiotic medicines through an IV tube.  Receiving fluids and nutrition through an IV tube.  Surgery. HOME CARE INSTRUCTIONS   Follow your health care provider's instructions carefully.  Follow a full liquid diet or other diet as directed by your health care provider. After your symptoms  improve, your health care provider may tell you to change your diet. He or she may recommend you eat a high-fiber diet. Fruits and vegetables are good sources of fiber. Fiber makes it easier to pass stool.  Take fiber supplements or probiotics as directed by your health care provider.  Only take medicines as directed by your health care provider.  Keep all your follow-up appointments. SEEK MEDICAL CARE IF:   Your pain does not improve.  You have a hard time eating food.  Your bowel movements do not return to normal. SEEK IMMEDIATE MEDICAL CARE IF:   Your pain becomes worse.  Your symptoms do not get better.  Your symptoms suddenly get worse.  You have a fever.  You have repeated vomiting.  You have bloody or black, tarry stools. MAKE SURE YOU:   Understand these instructions.  Will watch your condition.  Will get help right away if you are not doing well or get worse. Document Released: 04/02/2005 Document Revised: 06/28/2013 Document Reviewed: 05/18/2013 [facility] Patient Information 2015 [place], [place]. This information is not intended to replace advice given to you by your health care provider. Make sure you discuss any questions you have with your health care provider.

## 2015-02-09 NOTE — ED Notes (Signed)
MD at bedside. 

## 2015-02-09 NOTE — ED Provider Notes (Signed)
CSN: [id number]     Arrival date & time 02/09/15  1322 History   First MD Initiated Contact with Patient 02/09/15 1349     Chief Complaint  Patient presents with  . Chest Pain  . Groin Pain  . Diverticulitis     (Consider location/radiation/quality/duration/timing/severity/associated sxs/prior Treatment) Patient is a 59 y.o. female presenting with cough. The history is provided by the patient.  Cough Cough characteristics:  Productive Sputum characteristics:  Nondescript Severity:  Moderate Onset quality:  Gradual Duration:  1 week Timing:  Constant Progression:  Unchanged Chronicity:  New Relieved by:  Nothing Worsened by:  Nothing tried Ineffective treatments:  Home nebulizer Associated symptoms: chills   Associated symptoms: no fever and no shortness of breath     Past Medical History  Diagnosis Date  . Hypertension   . Arthritis   . GERD (gastroesophageal reflux disease)   . Hyperlipidemia   . History of recurrent UTIs   . H/O hiatal hernia   . History of esophageal dilatation   . History of TIAs NO RESIDUAL    1980;  2005;   2008 PT STATES PER CT  SCARRING RIGHT SIDE OF BRAIN  . Diverticulosis of colon   . SUI (stress urinary incontinence, female)   . Chronic cystitis   . OSA on CPAP     PER SLEEP STUDY 09-08-2012  MODERATE OSA  . Borderline diabetes   . Nocturia   . Anxiety   . Neuropathy   . Headache(784.0)     severe headaches   . Complication of anesthesia     02-20-2014 ([facility]) INTRA-OP RESPIRATORY FAILURE SECONDARY TO POSSIBLE MUCOUS ASPIRATION/  ALSO 06-06-2013 [facility]) POST-OP DESATURATION  EVEN USING CPAP  . Family history of adverse reaction to anesthesia     PER PT SISTER DIED AFTER GENERAL ANESTHESIA DUE TO UNDIAGNOSED OSA  . History of MRSA infection     3/ 2015  AXILLARY ABSCESS   Past Surgical History  Procedure Laterality Date  . Knee arthroscopy Bilateral 2008  . Esophageal dilation  X2  LAST ONE  JUNE 2014  . Tubal ligation  1987  .  Anterior and posterior repair N/A 06/06/2013    Procedure: Anterior vaginal vault repair, Sacrospinous ligament fixation with UPHOLD lite, Kelly plication, Sacrospinous mesh fixation, Solyx transurethral sling;  Surgeon: Ailene Rud, MD;  Location: [facility];  Service: Urology;  Laterality: N/A;  . Rectocele repair N/A 02/20/2014    Procedure: POSTERIOR REPAIR (RECTOCELE) WITH VAGINAL VAULT REPAIR WITH ACELL GRAFT PLACEMENT, PERINEOPLASTY, ENTEROCELE REPAIR;  Surgeon: Ailene Rud, MD;  Location: [facility];  Service: Urology;  Laterality: N/A;  . Cystoscopy w/ ureteral stent placement Right 04/21/2014    Procedure: CYSTOSCOPY WITH RETROGRADE PYELOGRAM/URETERAL STENT PLACEMENT;  Surgeon: Ailene Rud, MD;  Location: [facility];  Service: Urology;  Laterality: Right;  . Cystoscopy with retrograde pyelogram, ureteroscopy and stent placement Right 06/05/2014    Procedure: CYSTOSCOPY WITH RIGHT RETROGRADE PYELOGRAM, RIGHT URETEROSCOPY, STONE EXTRACTION AND RIGHT DOUBLE J STENT PLACEMENT;  Surgeon: Ailene Rud, MD;  Location: [facility];  Service: Urology;  Laterality: Right;  . Holmium laser application Right 32/20/2542    Procedure: HOLMIUM LASER OF STONE ;  Surgeon: Ailene Rud, MD;  Location: [facility];  Service: Urology;  Laterality: Right;  . Cystoscopy w/ ureteral stent placement Right 11/21/2014    Procedure: CYSTOSCOPY WITH RETROGRADE PYELOGRAM, URETEROSCOPY WITH STONE REMOVAL, URETERAL STENT PLACEMENT;  Surgeon: Carolan Clines,  MD;  Location: [facility];  Service: Urology;  Laterality: Right;   Family History  Problem Relation Age of Onset  . Colon cancer Father   . Esophageal cancer Neg Hx   . Rectal cancer Neg Hx   . Stomach cancer Neg Hx   . Heart disease Mother    History  Substance Use Topics  . Smoking status: Never Smoker   . Smokeless tobacco: Never Used  . Alcohol Use: No   OB History    No  data available     Review of Systems  Constitutional: Positive for chills. Negative for fever.  Respiratory: Positive for cough. Negative for shortness of breath.   Gastrointestinal: Positive for abdominal pain (LLQ).  All other systems reviewed and are negative.     Allergies  Metronidazole  Home Medications   Prior to Admission medications   Medication Sig Start Date End Date Taking? Authorizing Provider  albuterol (PROVENTIL HFA;VENTOLIN HFA) 108 (90 BASE) MCG/ACT inhaler Inhale 2 puffs into the lungs every 4 (four) hours as needed for wheezing. 08/01/12  Yes Adeline Saralyn Pilar, MD  amLODipine (NORVASC) 10 MG tablet Take 10 mg by mouth 2 (two) times daily.   Yes Historical Provider, MD  aspirin EC 81 MG tablet Take 81 mg by mouth daily.   Yes Historical Provider, MD  ciprofloxacin (CIPRO) 500 MG tablet Take 500 mg by mouth 2 (two) times daily. pm8/1/16-8/11/16am 02/05/15 02/15/15 Yes Historical Provider, MD  clonazePAM (KLONOPIN) 0.5 MG tablet Take 0.5 mg by mouth 3 (three) times daily as needed for anxiety.   Yes Historical Provider, MD  esomeprazole (NEXIUM) 40 MG capsule Take 1 capsule (40 mg total) by mouth 2 (two) times daily. 06/08/14  Yes Lafayette Dragon, MD  ibuprofen (ADVIL,MOTRIN) 800 MG tablet Take 800 mg by mouth every 8 (eight) hours as needed for headache, mild pain or moderate pain.   Yes Historical Provider, MD  lisinopril (PRINIVIL,ZESTRIL) 20 MG tablet Take 20 mg by mouth at bedtime.    Yes Historical Provider, MD  Multiple Vitamin (MULTIVITAMIN WITH MINERALS) TABS tablet Take 1 tablet by mouth daily.   Yes Historical Provider, MD  omeprazole (PRILOSEC) 40 MG capsule Take 40 mg by mouth 2 (two) times daily.   Yes Historical Provider, MD  polyethylene glycol (MIRALAX / GLYCOLAX) packet Take 17 g by mouth daily.   Yes Historical Provider, MD  predniSONE (DELTASONE) 50 MG tablet Take 50 mg by mouth daily. For 5 days. 02/05/15-02/09/15 02/05/15 02/10/15 Yes Historical Provider, MD    rizatriptan (MAXALT-MLT) 10 MG disintegrating tablet Take 1 tablet (10 mg total) by mouth as needed for migraine. May repeat in 2 hours if needed 02/21/13  Yes Marcial Pacas, MD  topiramate (TOPAMAX) 50 MG tablet Take 50 mg by mouth 2 (two) times daily.   Yes Historical Provider, MD  metroNIDAZOLE (FLAGYL) 500 MG tablet Take 500 mg by mouth 2 (two) times daily.    Historical Provider, MD  oxybutynin (DITROPAN) 5 MG tablet Use 3x/day for bladder spasms. Caution: dry eyes, dry mouth, constipation,  confusion Patient not taking: Reported on 07/18/2014 06/05/14   Carolan Clines, MD  oxyCODONE-acetaminophen (ROXICET) 5-325 MG per tablet Take 1 tablet by mouth every 6 (six) hours as needed for moderate pain or severe pain. Take 1/2 to 1 tablet q 4-6 hrs as needed for pain. May cause constipation May cause nausea Patient not taking: Reported on 02/09/2015 11/21/14   Carolan Clines, MD  trimethoprim (TRIMPEX) 100 MG tablet Take  1 tablet (100 mg total) by mouth 1 day or 1 dose. Patient not taking: Reported on 02/09/2015 11/21/14   Carolan Clines, MD   BP 134/78 mmHg  Pulse 80  Temp(Src) 99 F (37.2 C) (Oral)  Resp 25  SpO2 95% Physical Exam  Constitutional: She is oriented to person, place, and time. She appears well-developed and well-nourished. No distress.  HENT:  Head: Normocephalic.  Eyes: Conjunctivae are normal.  Neck: Neck supple. No tracheal deviation present.  Cardiovascular: Normal rate and regular rhythm.   Pulmonary/Chest: Effort normal. No accessory muscle usage. No respiratory distress.  Course bilateral breath sounds and hacking cough  Abdominal: Soft. She exhibits no distension. There is tenderness (mild) in the left lower quadrant. There is no rigidity, no rebound and no guarding.    Neurological: She is alert and oriented to person, place, and time.  Skin: Skin is warm and dry.  Psychiatric: She has a normal mood and affect.    ED Course  Procedures (including  critical care time) Labs Review Labs Reviewed  COMPREHENSIVE METABOLIC PANEL - Abnormal; Notable for the following:    Glucose, Bld 108 (*)    Albumin 3.3 (*)    All other components within normal limits  CBC WITH DIFFERENTIAL/PLATELET - Abnormal; Notable for the following:    Platelets 423 (*)    All other components within normal limits  URINALYSIS, ROUTINE W REFLEX MICROSCOPIC (NOT AT [facility])  CBC WITH DIFFERENTIAL/PLATELET  I-STAT CG4 LACTIC ACID, ED    Imaging Review Dg Chest 2 View  02/09/2015   CLINICAL DATA:  Cough, shortness of breath and chest pressure for 1 month.  EXAM: CHEST  2 VIEW  COMPARISON:  Chest radiograph 02/05/2015  FINDINGS: Multiple monitoring leads overlie the patient. Stable cardiac and mediastinal contours with mild tortuosity of the thoracic aorta. No consolidative pulmonary opacities. No pleural effusion or pneumothorax. Mid thoracic spine degenerative changes.  IMPRESSION: No acute cardiopulmonary process.   Electronically Signed   By: Lovey Newcomer M.D.   On: 02/09/2015 15:24     EKG Interpretation None      MDM   Final diagnoses:  Cough  Acute bronchitis, unspecified organism  Diverticulitis of large intestine without perforation or abscess without bleeding   59 year old female presents with ongoing cough symptoms from her primary care physician's office where she was diagnosed with bronchitis. She has coarse bilateral breath sounds and was also diagnosed with diverticulitis by CT scan. She has had ongoing cough that has not been relieved with multiple over-the-counter medications and DuoNeb's. Here she has no signs of clinical worsening or perforation on her labs or with her clinical symptoms.  She has been noncompliant with antibiotic therapy because she states that Flagyl has been giving her headaches so she has only been taking Cipro. Today we will change her antibiotics to moxifloxacin as single agent therapy for diverticulitis to prevent partial  treatment. No airspace consolidation noted on chest x-ray. Plan to follow up with PCP as needed and return precautions discussed for worsening or new concerning symptoms.    Leo Grosser, MD 02/09/15 [phone]

## 2015-03-08 ENCOUNTER — Encounter: Payer: Self-pay | Admitting: Pulmonary Disease

## 2015-03-08 ENCOUNTER — Ambulatory Visit (INDEPENDENT_AMBULATORY_CARE_PROVIDER_SITE_OTHER): Payer: Medicaid Other | Admitting: Pulmonary Disease

## 2015-03-08 VITALS — BP 128/84 | HR 73 | Temp 98.1°F | Ht 62.0 in | Wt 223.8 lb

## 2015-03-08 DIAGNOSIS — R05 Cough: Secondary | ICD-10-CM | POA: Diagnosis not present

## 2015-03-08 DIAGNOSIS — R058 Other specified cough: Secondary | ICD-10-CM

## 2015-03-08 DIAGNOSIS — K219 Gastro-esophageal reflux disease without esophagitis: Secondary | ICD-10-CM

## 2015-03-08 DIAGNOSIS — J387 Other diseases of larynx: Secondary | ICD-10-CM

## 2015-03-08 DIAGNOSIS — T464X5A Adverse effect of angiotensin-converting-enzyme inhibitors, initial encounter: Secondary | ICD-10-CM

## 2015-03-08 DIAGNOSIS — R053 Chronic cough: Secondary | ICD-10-CM

## 2015-03-08 MED ORDER — CHLORPHENIRAMINE MALEATE 4 MG PO TABS: 4.0000 mg | ORAL_TABLET | Freq: Every day | ORAL | Status: DC

## 2015-03-08 MED ORDER — FLUTICASONE PROPIONATE 50 MCG/ACT NA SUSP: 2.0000 | Freq: Every day | NASAL | Status: DC

## 2015-03-08 NOTE — Patient Instructions (Signed)
Try NeilMed sinus rinse daily Flonase two sprays each nostril daily Sip water when you have urge to cough Salt water gargles once or twice per day Avoid excessive talking Chlorpheniramine 4 mg nightly  Follow up with Dr. Halford Chessman or Tammy Parrett in 2 weeks

## 2015-03-08 NOTE — Progress Notes (Signed)
Chief Complaint  Patient presents with  . Follow-up    last seeen march 2014. pt was in urgent care tuesday for productive cough. cough started 2 months ago. pt has been taking steroids perscribed by Dr. Eldridge Abrahams and states she wont take any more becuase of weight gain. pt states she is coughing blood off and on.     History of Present Illness: Ann [name] is a 59 y.o. female with OSA and cough.  I last saw her for cough in 2014.  This was felt to be related to post-nasal drip and reflux.  She had improved until June 2016.  She developed a cough about 2 months ago  This started after her daughter, and then her grand child were sick.  She has been getting a rattle in her chest, and was coughing up chunks of green sputum.  She gets wheeze from her throat and chest area.  She has been using cough drops frequently, but these don't help much.  She has been getting sinus congestion and post-nasal drip.  She also has noticed more trouble with reflux.  She has been seen several times at [facility] for cough.  She was dx with bronchitis in June 2016 and tx with prednisone and albuterol.  She was also tx with bactrim for skin sores.  In mid July 2016 she had an episode in which she thinks she aspirated a piece of a pretzel.  This caused coughing and gagging.  At beginning of August she was again seen at [facility] and tx with prednisone and nebulizer.  CXR from 02/05/15 showed bronchitic markings.  She was found to also have diverticulitis.  She was tx with cipro, flagyl> changed to avelox after she had complaint of vomiting/coughing some blood.  Her cough persisted.  She was seen on 03/06/15 at [facility] and tx with prednisone, lasix, tussionex, and prescribed itraconazole.  She went to pharmacy and was told itraconazole would be very expensive, and did not fill it.  She had lab work from 03/06/15 which showed elevated D dimer, and CXR showed no acute cardiopulmonary findings.  She reports having persistently elevated D  dimer, but no hx of thromboembolic disease.  She was told at urgent care that she needed to have CT angiogram of chest to r/o PE to cover physician at [facility].  She has gained almost 25 lbs after several courses of prednisone.  She intermittently takes lisinopril.  She has been on this for the past 8 years.  She has not been as regular with lisinopril use over the past 3 months, but did start retaking it this past few weeks.   TESTS: PFT 09/14/12 >> FEV1 1.88 (78%), FEV1% 78, TLC 3.95 (80%), DLCO 92%, no BD PSG 09/08/12 >> AHI 12.2, SpO2 low 79%, Spent 46 min with SpO2 < 88%, PLMI 0 CPAP titration 10/11/12 >> CPAP 14 cm H2O >> AHI 2.9, +R, +S. Did not need supplemental oxygen.  PMHx >> HTN, GERD, HLD, HH, s/p esophageal dilation, diverticulosis, HA, chronic cystitis, anxiety,   PSHx, Medications, Allergies, Fhx, Shx reviewed.   Physical Exam: BP 128/84 mmHg  Pulse 73  Temp(Src) 98.1 F (36.7 C) (Oral)  Ht 5\' 2"  (1.575 m)  Wt 223 lb 12.8 oz (101.515 kg)  BMI 40.92 kg/m2  SpO2 96%  General - No distress ENT - No sinus tenderness, no oral exudate, no LAN, raspy voice, clear nasal discharge Cardiac - s1s2 regular, no murmur Chest - No wheeze/rales/dullness Back - No focal tenderness  Abd - Soft, non-tender Ext - No edema Neuro - Normal strength Skin - No rashes Psych - normal mood, and behavior  Discussion: She has chronic cyclic cough related to post-nasal drip and upper airway cough syndrome after recent URI.  She has intermittently been on ACE inhibitor which could be contributing to cough also.  She has hx of reflux, and has been on nexium.  There was also a question of aspiration of a piece of pretzel.  Assessment/Plan:  Upper airway cough syndrome. Plan: - nasal irrigation and flonase - chlorpheniramine 4 mg qhs  Cyclic cough syndrome. Plan: - salt water gargles, sip on water with urge to cough, avoid talking, sugarless candy to keep mouth moist  Larygnopharyngeal  reflux. Plan: - continue nexium  ACE inhibitor cough. Plan: - advised her to not take lisinopril anymor - she is to f/u with PCP for blood pressure management  Elevated D dimer. There is nothing to suggest thromboembolic disease. Plan: - advised she can defer CT chest for now  Obstructive sleep apnea. Plan: - will re-assess status of sleep apnea therapy once her cough has improved  ?of aspiration on pretzel. Most recent CXR reported as normal from 03/06/15. Plan: - if cough persists, then will might need CT chest and bronchoscopy with airway inspection.   Chesley Mires, MD [facility] Pager:  [phone] 03/08/2015, 11:11 AM

## 2015-03-12 DIAGNOSIS — K219 Gastro-esophageal reflux disease without esophagitis: Secondary | ICD-10-CM | POA: Insufficient documentation

## 2015-03-12 DIAGNOSIS — R05 Cough: Secondary | ICD-10-CM | POA: Insufficient documentation

## 2015-03-12 DIAGNOSIS — T464X5A Adverse effect of angiotensin-converting-enzyme inhibitors, initial encounter: Secondary | ICD-10-CM

## 2015-03-12 DIAGNOSIS — R058 Other specified cough: Secondary | ICD-10-CM | POA: Insufficient documentation

## 2015-03-27 ENCOUNTER — Telehealth: Payer: Self-pay | Admitting: Internal Medicine

## 2015-03-27 NOTE — Telephone Encounter (Signed)
Patient states she is not able to eat without food sticking. Scheduled OV on 04/04/15 at 3:00 PM with Alonza Bogus, PA.

## 2015-03-29 ENCOUNTER — Telehealth: Payer: Self-pay | Admitting: Pulmonary Disease

## 2015-03-29 NOTE — Telephone Encounter (Signed)
lmtcb X1 for pt  

## 2015-03-29 NOTE — Telephone Encounter (Signed)
Called pt. She reports her cough is no better. Offered appt today but could not come in. She is scheduled to see MW tomorrow at 3:45. Nothing further needed

## 2015-03-30 ENCOUNTER — Encounter: Payer: Self-pay | Admitting: Internal Medicine

## 2015-03-30 ENCOUNTER — Ambulatory Visit (INDEPENDENT_AMBULATORY_CARE_PROVIDER_SITE_OTHER): Payer: Medicaid Other | Admitting: Internal Medicine

## 2015-03-30 VITALS — BP 166/110 | HR 88 | Ht 62.0 in | Wt 225.0 lb

## 2015-03-30 DIAGNOSIS — Z23 Encounter for immunization: Secondary | ICD-10-CM | POA: Diagnosis not present

## 2015-03-30 DIAGNOSIS — N816 Rectocele: Secondary | ICD-10-CM | POA: Diagnosis not present

## 2015-03-30 DIAGNOSIS — R058 Other specified cough: Secondary | ICD-10-CM

## 2015-03-30 DIAGNOSIS — I10 Essential (primary) hypertension: Secondary | ICD-10-CM

## 2015-03-30 DIAGNOSIS — R05 Cough: Secondary | ICD-10-CM

## 2015-03-30 MED ORDER — TRAMADOL HCL 50 MG PO TABS: ORAL_TABLET | ORAL | Status: DC

## 2015-03-30 MED ORDER — NEBIVOLOL HCL 10 MG PO TABS: 10.0000 mg | ORAL_TABLET | Freq: Every day | ORAL | Status: DC

## 2015-03-30 MED ORDER — FLUTTER DEVI: Status: DC

## 2015-03-30 MED ORDER — FAMOTIDINE 20 MG PO TABS: ORAL_TABLET | ORAL | Status: DC

## 2015-03-30 NOTE — Patient Instructions (Addendum)
nexium 30- 60 min before bfast and prilosec 40 mg  Before supper and pepcid 20 mg at bedtime for now   For drainage / throat tickle try take CHLORPHENIRAMINE  4 mg - take  Up to 2  every 4 hours as needed - available over the counter- may cause drowsiness so start with just a bedtime dose or two and see how you tolerate it before trying in daytime    Take delsym two tsp every 12 hours and supplement if needed with  tramadol 50 mg up to 2 every 4 hours to suppress the urge to cough. Swallowing water or using ice chips/non mint and menthol containing candies (such as lifesavers or sugarless jolly ranchers) are also effective.  You should rest your voice and avoid activities that you know make you cough.  Once you have eliminated the cough for 3 straight days try reducing the tramadol first,  then the delsym as tolerated.   GERD (REFLUX)  is an extremely common cause of respiratory symptoms just like yours , many times with no obvious heartburn at all.    It can be treated with medication, but also with lifestyle changes including elevation of the head of your bed (ideally with 6 inch  bed blocks),  Smoking cessation, avoidance of late meals, excessive alcohol, and avoid fatty foods, chocolate, peppermint, colas, red wine, and acidic juices such as orange juice.  NO MINT OR MENTHOL PRODUCTS SO NO COUGH DROPS  USE SUGARLESS CANDY INSTEAD (Jolley ranchers or Stover's or Life Savers) or even ice chips will also do - the key is to swallow to prevent all throat clearing. NO OIL BASED VITAMINS - use powdered substitutes  Use the flutter every single time you cough  Please schedule a follow up office visit in 2 weeks, sooner if needed with all meds in hand     Late add bystolic 10 mg daily x 2 week sample given.

## 2015-03-30 NOTE — Progress Notes (Signed)
Subjective:     Patient ID: Ann [name], female   DOB: 07-12-1955,    MRN: [id number]  HPI   17 yowm never smoker with whooping cough as child age 59 and ever since then once or twice a year severe cough and fine in between episodes but gradually pattern changed to where cough continually with improved some with  treatment but never better since " I just can't tell ya"  referred to pulmonary clinic 03/30/2015 for clearance for surgery cc worse cough x 01/2015 worst ever. Seen By Dr Halford Chessman 03/08/15 for cough rec Try NeilMed sinus rinse daily Flonase two sprays each nostril daily Sip water when you have urge to cough Salt water gargles once or twice per day Avoid excessive talking Chlorpheniramine 4 mg nightly  Studies done to date  TESTS: PFT 09/27/12 >> FEV1 1.88 (78%), FEV1% 78, TLC 3.95 (80%), DLCO 92%, no BD PSG 09/08/12 >> AHI 12.2, SpO2 low 79%, Spent 46 min with SpO2 < 88%, PLMI 0 CPAP titration 10/11/12 >> CPAP 14 cm H2O >> AHI 2.9, +R, +S. Did not need supplemental oxygen.   03/30/2015  extended ov/Wert re: preop clearance / severe cough and severe obesity  Chief Complaint  Patient presents with  . Advice Only    Pt states needing pulmonary clearance for rectocele surgery. She states that her cough is unchanged or may be worse since last visit.   In mid July 2016 she had an episode in which she thinks she aspirated a piece of a pretzel. This caused coughing and gagging. And persisting assoc with dysphagia > GI eval in progress.  No better with cough drops.  Was intermittently on acei > took last dose ? One day prior to OV     Cough control best when sit bolt upright / not talking/ not clear she's doing anything she was asked to do at last ov. Coughs so hards pees on her self/ makes her "recotcele pain worse".  Cough is mostly dry worse lying down or with any activity. saba not helping/ steroids just made her gain wt/ not better  No obvious day to day or daytime variability or assoc sob  or cp or chest tightness, subjective wheeze or overt sinus or hb symptoms. No unusual exp hx or h/o childhood pna/ asthma or knowledge of premature birth.    Also denies any obvious fluctuation of symptoms with weather or environmental changes or other aggravating or alleviating factors except as outlined above   Current Medications, Allergies, Complete Past Medical History, Past Surgical History, Family History, and Social History were reviewed in [employer] record.  ROS  The following are not active complaints unless bolded sore throat, dysphagia, dental problems, itching, sneezing,  nasal congestion or excess/ purulent secretions, ear ache,   fever, chills, sweats, unintended wt loss, classically pleuritic or exertional cp, hemoptysis,  orthopnea pnd or leg swelling, presyncope, palpitations, abdominal pain, anorexia, nausea, vomiting, diarrhea  or change in bowel or bladder habits, change in stools or urine, dysuria,hematuria,  rash, arthralgias, visual complaints, headache, numbness, weakness or ataxia or problems with walking or coordination,  change in mood/affect or memory.          Review of Systems     Objective:   Physical Exam  Obese amb animated wf nad who failed to answer a single question asked in a straightforward manner, tending to go off on tangents or answer questions with ambiguous medical terms or diagnoses and seemed aggravated  when asked the same question more than once for clarification.   Wt Readings from Last 3 Encounters:  03/30/15 225 lb (102.059 kg)  03/08/15 223 lb 12.8 oz (101.515 kg)  11/21/14 211 lb (95.709 kg)    Vital signs reviewed   HEENT: nl dentition, turbinates, and orophanx. Nl external ear canals without cough reflex   NECK :  without JVD/Nodes/TM/ nl carotid upstrokes bilaterally   LUNGS: no acc muscle use, clear to A and P bilaterally without cough on insp or exp maneuvers   CV:  RRR  no s3 or murmur or  increase in P2, no edema   ABD:  soft and nontender with nl excursion in the supine position. No bruits or organomegaly, bowel sounds nl  MS:  warm without deformities, calf tenderness, cyanosis or clubbing  SKIN: warm and dry without lesions    NEURO:  alert, approp, no deficits     I personally reviewed images and agree with radiology impression as follows:  CXR:  02/09/15 No acute cardiopulmonary process.     Assessment:

## 2015-04-01 ENCOUNTER — Encounter: Payer: Self-pay | Admitting: Internal Medicine

## 2015-04-01 NOTE — Assessment & Plan Note (Signed)
Body mass index is 41.14 kg/(m^2).  No results found for: TSH   Contributing to gerd tendency/ doe/reviewed the need and the process to achieve and maintain neg calorie balance > defer f/u primary care including intermittently monitoring thyroid status

## 2015-04-01 NOTE — Assessment & Plan Note (Addendum)
The most common causes of chronic cough in immunocompetent adults include the following: upper airway cough syndrome (UACS), previously referred to as postnasal drip syndrome (PNDS), which is caused by variety of rhinosinus conditions; (2) asthma; (3) GERD; (4) chronic bronchitis from cigarette smoking or other inhaled environmental irritants; (5) nonasthmatic eosinophilic bronchitis; and (6) bronchiectasis.   These conditions, singly or in combination, have accounted for up to 94% of the causes of chronic cough in prospective studies.   Other conditions have constituted no >6% of the causes in prospective studies These have included bronchogenic carcinoma, chronic interstitial pneumonia, sarcoidosis, left ventricular failure, ACEI-induced cough, and aspiration from a condition associated with pharyngeal dysfunction.    Chronic cough is often simultaneously caused by more than one condition. A single cause has been found from 38 to 82% of the time, multiple causes from 18 to 62%. Multiply caused cough has been the result of three diseases up to 42% of the time.       Based on hx and exam, this is most likely:  Classic Upper airway cough syndrome, so named because it's frequently impossible to sort out how much is  CR/sinusitis with freq throat clearing (which can be related to primary GERD)   vs  causing  secondary (" extra esophageal")  GERD from wide swings in gastric pressure that occur with throat clearing, often  promoting self use of mint and menthol lozenges that reduce the lower esophageal sphincter tone and exacerbate the problem further in a cyclical fashion.   These are the same pts (now being labeled as having "irritable larynx syndrome" by some cough centers) who not infrequently have a history of having failed to tolerate ace inhibitors (clearly the case here) ,  dry powder inhalers or biphosphonates or report having atypical reflux symptoms (clearly the case here)that don't respond to  standard doses of PPI , and are easily confused as having aecopd or asthma flares by even experienced allergists/ pulmonologists.   The first step is to maximize acid suppression (and see GI as planned) and eliminate cyclical coughing then regroup if the cough persists.  I had an extended discussion with the patient reviewing all relevant studies completed to date and  lasting 35 min  1) Explained: The standardized cough guidelines published in Chest by Lissa Morales in 2006 are still the best available and consist of a multiple step process (up to 12!) , not a single office visit,  and are intended  to address this problem logically,  with an alogrithm dependent on response to empiric treatment at  each progressive step  to determine a specific diagnosis with  minimal addtional testing needed. Therefore if adherence is an issue or can't be accurately verified,  it's very unlikely the standard evaluation and treatment will be successful here.    Furthermore, response to therapy (other than acute cough suppression, which should only be used short term with avoidance of narcotic containing cough syrups if possible), can be a gradual process for which the patient may perceive immediate benefit.  Unlike going to an eye doctor where the best perscription is almost always the first one and is immediately effective, this is almost never the case in the management of chronic cough syndromes. Therefore the patient needs to commit up front to consistently adhere to recommendations  for up to 6 weeks of therapy directed at the likely underlying problem(s) before the response can be reasonably evaluated.     2) Each maintenance medication was reviewed in  detail including most importantly the difference between maintenance and prns and under what circumstances the prns are to be triggered using an action plan format that is not reflected in the computer generated alphabetically organized AVS.    Please see  instructions for details which were reviewed in writing and the patient given a copy highlighting the part that I personally wrote and discussed at today's ov.   See instructions for specific recommendations which were reviewed directly with the patient who was given a copy with highlighter outlining the key components.

## 2015-04-01 NOTE — Assessment & Plan Note (Signed)
Poorly controlled > add bystolic 10 mg daily and avoid acei indefinitely due to cough tendency x "I couldn't tell ya" how long but apparently most of adult life  ACE inhibitors are problematic in  pts with airway complaints because  even experienced pulmonologists can't always distinguish ace effects from copd/asthma.  By themselves they don't actually cause a problem, much like oxygen can't by itself start a fire, but they certainly serve as a powerful catalyst or enhancer for any "fire"  or inflammatory process in the upper airway, be it caused by an ET  tube or more commonly reflux (especially in the obese or pts with known GERD or who are on biphoshonates).   Would avoid this class completely.

## 2015-04-01 NOTE — Assessment & Plan Note (Signed)
No point in doing any surgery until we have the cough cleared.

## 2015-04-01 NOTE — Addendum Note (Signed)
Addended by: Christinia Gully B on: 04/01/2015 07:13 AM   Modules accepted: Level of Service

## 2015-04-04 ENCOUNTER — Ambulatory Visit (INDEPENDENT_AMBULATORY_CARE_PROVIDER_SITE_OTHER): Payer: Medicaid Other | Admitting: Gastroenterology

## 2015-04-04 ENCOUNTER — Encounter: Payer: Self-pay | Admitting: Gastroenterology

## 2015-04-04 VITALS — BP 130/90 | Ht 62.0 in | Wt 222.1 lb

## 2015-04-04 DIAGNOSIS — Z8719 Personal history of other diseases of the digestive system: Secondary | ICD-10-CM | POA: Insufficient documentation

## 2015-04-04 DIAGNOSIS — R1310 Dysphagia, unspecified: Secondary | ICD-10-CM | POA: Diagnosis not present

## 2015-04-04 NOTE — Patient Instructions (Signed)

## 2015-04-04 NOTE — Progress Notes (Signed)
Agree with assessment and plan as outlined.  

## 2015-04-04 NOTE — Progress Notes (Signed)
     04/04/2015 Ann [name] [id number] 1956-02-06   History of Present Illness:  This is a 59 year old female who is previously known to Dr. Olevia Perches. She has a recurrent peptic stricture that was last dilated in December 2015 to 16 mm. She also had grade 2 esophagitis at that time. She is on PPI twice daily. She returns to our office today with recurrent complaints of dysphagia. She says that her symptoms have been present again starting about 3 months ago, but have significantly worsened over the past month. She's not been eating any meat products at all and has now been having problems swallowing bread products also. She describes food sticking when she eats and it ends up coming back up on her. Symptoms resolved after last dilation until just about 3 months ago. She does have a chronic cough, which is being evaluated by pulmonary as well.   Current Medications, Allergies, Past Medical History, Past Surgical History, Family History and Social History were reviewed in [employer] record.   Physical Exam: BP 130/90 mmHg  Ht 5\' 2"  (1.575 m)  Wt 222 lb 2 oz (100.755 kg)  BMI 40.62 kg/m2 General: Well developed white female in no acute distress Head: Normocephalic and atraumatic Eyes:  Sclerae anicteric, conjunctiva pink  Ears: Normal auditory acuity Lungs: Clear throughout to auscultation Heart: Regular rate and rhythm Abdomen: Soft, non-distended.  Normal bowel sounds.  Mild epigastric TTP without R/R/G. Musculoskeletal: Symmetrical with no gross deformities  Extremities: No edema  Neurological: Alert oriented x 4, grossly non-focal Psychological:  Alert and cooperative. Normal mood and affect  Assessment and Recommendations: -Recurrent dysphagia in a patient with history of esophageal stricture:  Symptoms recurrent again for the past 3 months, particularly worsening over the past month.  Will scheduled for EGD with dilation with Dr. Havery Moros.  The risks,  benefits, and alternatives to EGD with dilation were discussed with the patient and she consents to proceed.

## 2015-04-05 ENCOUNTER — Encounter: Payer: Self-pay | Admitting: Gastroenterology

## 2015-04-05 ENCOUNTER — Ambulatory Visit (AMBULATORY_SURGERY_CENTER): Payer: Medicaid Other | Admitting: Gastroenterology

## 2015-04-05 VITALS — BP 125/79 | HR 54 | Temp 98.1°F | Resp 19 | Ht 62.0 in | Wt 222.0 lb

## 2015-04-05 DIAGNOSIS — R1310 Dysphagia, unspecified: Secondary | ICD-10-CM

## 2015-04-05 MED ORDER — SODIUM CHLORIDE 0.9 % IV SOLN: 500.0000 mL | INTRAVENOUS | Status: DC

## 2015-04-05 NOTE — Op Note (Signed)
[facility]  [address] [place], [address]   ENDOSCOPY PROCEDURE REPORT  PATIENT: Ann, [name]  MR#: [id number] BIRTHDATE: March 21, 1956 , 48  yrs. old GENDER: female ENDOSCOPIST: Yetta Flock, MD REFERRED BY: PROCEDURE DATE:  04/05/2015 PROCEDURE:  EGD w/ balloon dilation ASA CLASS:     Class III INDICATIONS:  dysphagia, history of GEJ stricture requiring multiple dilations. MEDICATIONS: Propofol 230 mg IV TOPICAL ANESTHETIC:  DESCRIPTION OF PROCEDURE: After the risks benefits and alternatives of the procedure were thoroughly explained, informed consent was obtained.  The LB [id number] endoscope was introduced through the mouth and advanced to the second portion of the duodenum , Without limitations.  The instrument was slowly withdrawn as the mucosa was fully examined.   FINDINGS: The esophageal mucosa was normal.  There was a short, benign appearing stenosis at the GEJ, roughly 13-88mm in diameter. It was dilated to 13.47mm, 14.54mm, 15.1mm, and then 40mm with TTS balloon until a mucosal wrent was noted.  DH noted at 38cm from the incisors, with GEJ / SCJ noted at 35cm from the incisors, with 3cm hiatal hernia.  There was laxity noted at the [facility] on retroflexion. The remainder of the stomach was normal.  Normal appearing duodenal bulb and 2nd portion duodenum.  Retroflexed views revealed as previously described.     The scope was then withdrawn from the patient and the procedure completed.  COMPLICATIONS: There were no immediate complications.  ENDOSCOPIC IMPRESSION: Benign appearing stenosis at the GEJ - dilated up to 10mm via TTS balloon with small mucosal wrent noted 3cm hiatal hernia Normal stomach Normal duodenum  RECOMMENDATIONS: Resume medications Resume diet Await response to dilation. If symptoms persist please call to schedule another repeat EGD for further dilation and follow up in the clinic.  eSigned:  Yetta Flock, MD 04/05/2015 2:47 PM    CC:

## 2015-04-05 NOTE — Patient Instructions (Signed)
YOU HAD AN ENDOSCOPIC PROCEDURE TODAY AT [facility]:   Refer to the procedure report that was given to you for any specific questions about what was found during the examination.  If the procedure report does not answer your questions, please call your gastroenterologist to clarify.  If you requested that your care partner not be given the details of your procedure findings, then the procedure report has been included in a sealed envelope for you to review at your convenience later.  YOU SHOULD EXPECT: Some feelings of bloating in the abdomen. Passage of more gas than usual.  Walking can help get rid of the air that was put into your GI tract during the procedure and reduce the bloating. If you had a lower endoscopy (such as a colonoscopy or flexible sigmoidoscopy) you may notice spotting of blood in your stool or on the toilet paper. If you underwent a bowel prep for your procedure, you may not have a normal bowel movement for a few days.  Please Note:  You might notice some irritation and congestion in your nose or some drainage.  This is from the oxygen used during your procedure.  There is no need for concern and it should clear up in a day or so.  SYMPTOMS TO REPORT IMMEDIATELY:   Following upper endoscopy (EGD)  Vomiting of blood or coffee ground material  New chest pain or pain under the shoulder blades  Painful or persistently difficult swallowing  New shortness of breath  Fever of 100F or higher  Black, tarry-looking stools  For urgent or emergent issues, a gastroenterologist can be reached at any hour by calling [phone].   DIET: NOTHING TO EAT OR DRINK UNTIL 3:30 PM 3:30 UNTIL 4:30 ONLY CLEAR LIQUIDS. AFTER 4:30 UNTIL MORNING ONLY SOFT FOODS. RESUME YOUR REGULAR DIET IN THE AM.  ACTIVITY:  You should plan to take it easy for the rest of today and you should NOT DRIVE or use heavy machinery until tomorrow (because of the sedation medicines used during the  test).    FOLLOW UP: Our staff will call the number listed on your records the next business day following your procedure to check on you and address any questions or concerns that you may have regarding the information given to you following your procedure. If we do not reach you, we will leave a message.  However, if you are feeling well and you are not experiencing any problems, there is no need to return our call.  We will assume that you have returned to your regular daily activities without incident.  If any biopsies were taken you will be contacted by phone or by letter within the next 1-3 weeks.  Please call us at [phone] if you have not heard about the biopsies in 3 weeks.    SIGNATURES/CONFIDENTIALITY: You and/or your care partner have signed paperwork which will be entered into your electronic medical record.  These signatures attest to the fact that that the information above on your After Visit Summary has been reviewed and is understood.  Full responsibility of the confidentiality of this discharge information lies with you and/or your care-partner.

## 2015-04-05 NOTE — Progress Notes (Signed)
Report to PACU, RN, vss, BBS= Clear.  

## 2015-04-06 ENCOUNTER — Telehealth: Payer: Self-pay | Admitting: *Deleted

## 2015-04-06 NOTE — Telephone Encounter (Signed)
  Follow up Call-  Call back number 04/05/2015 06/07/2014 08/13/2012  Post procedure Call Back phone  # [phone]  Permission to leave phone message Yes Yes Yes     Patient questions:  Do you have a fever, pain , or abdominal swelling? No. Pain Score  0 *  Have you tolerated food without any problems? Yes.    Have you been able to return to your normal activities? Yes.    Do you have any questions about your discharge instructions: Diet   No. Medications  No. Follow up visit  No.  Do you have questions or concerns about your Care? No.  Actions: * If pain score is 4 or above: No action needed, pain <4.

## 2015-04-13 ENCOUNTER — Encounter: Payer: Self-pay | Admitting: Internal Medicine

## 2015-04-13 ENCOUNTER — Ambulatory Visit (INDEPENDENT_AMBULATORY_CARE_PROVIDER_SITE_OTHER): Payer: Medicaid Other | Admitting: Internal Medicine

## 2015-04-13 ENCOUNTER — Other Ambulatory Visit (INDEPENDENT_AMBULATORY_CARE_PROVIDER_SITE_OTHER): Payer: Medicaid Other

## 2015-04-13 VITALS — BP 130/80 | HR 75 | Ht 62.0 in | Wt 221.0 lb

## 2015-04-13 DIAGNOSIS — R05 Cough: Secondary | ICD-10-CM | POA: Diagnosis not present

## 2015-04-13 DIAGNOSIS — R058 Other specified cough: Secondary | ICD-10-CM

## 2015-04-13 DIAGNOSIS — I10 Essential (primary) hypertension: Secondary | ICD-10-CM

## 2015-04-13 LAB — CBC WITH DIFFERENTIAL/PLATELET
BASOS PCT: 0.4 % (ref 0.0–3.0)
Basophils Absolute: 0 10*3/uL (ref 0.0–0.1)
EOS PCT: 1.8 % (ref 0.0–5.0)
Eosinophils Absolute: 0.1 10*3/uL (ref 0.0–0.7)
HEMATOCRIT: 42.5 % (ref 36.0–46.0)
Hemoglobin: 14.4 g/dL (ref 12.0–15.0)
LYMPHS PCT: 32 % (ref 12.0–46.0)
Lymphs Abs: 2.2 10*3/uL (ref 0.7–4.0)
MCHC: 33.9 g/dL (ref 30.0–36.0)
MCV: 92.8 fl (ref 78.0–100.0)
MONOS PCT: 7.6 % (ref 3.0–12.0)
Monocytes Absolute: 0.5 10*3/uL (ref 0.1–1.0)
NEUTROS ABS: 4 10*3/uL (ref 1.4–7.7)
Neutrophils Relative %: 58.2 % (ref 43.0–77.0)
PLATELETS: 338 10*3/uL (ref 150.0–400.0)
RBC: 4.57 Mil/uL (ref 3.87–5.11)
RDW: 14.9 % (ref 11.5–15.5)
WBC: 6.9 10*3/uL (ref 4.0–10.5)

## 2015-04-13 MED ORDER — TRAMADOL HCL 50 MG PO TABS: ORAL_TABLET | ORAL | Status: DC

## 2015-04-13 NOTE — Patient Instructions (Addendum)
Nexium 30- 60 min before bfast and prilosec 40 mg  Before supper and pepcid 20 mg at bedtime for now   For drainage / throat tickle try take CHLORPHENIRAMINE  4 mg - take  Up to 2  every 4 hours as needed - available over the counter- may cause drowsiness so start with just a bedtime dose or two and see how you tolerate it before trying in daytime    Take delsym two tsp every 12 hours and supplement if needed with  tramadol 50 mg up to 2 every 4 hours to suppress the urge to cough. Swallowing water or using ice chips/non mint and menthol containing candies (such as lifesavers or sugarless jolly ranchers) are also effective.  You should rest your voice and avoid activities that you know make you cough.  Once you have eliminated the cough for 3 straight days try reducing the tramadol first,  then the delsym as tolerated.   GERD (REFLUX)  is an extremely common cause of respiratory symptoms just like yours , many times with no obvious heartburn at all.    It can be treated with medication, but also with lifestyle changes including elevation of the head of your bed (ideally with 6 inch  bed blocks),  Smoking cessation, avoidance of late meals, excessive alcohol, and avoid fatty foods, chocolate, peppermint, colas, red wine, and acidic juices such as orange juice.  NO MINT OR MENTHOL PRODUCTS SO NO COUGH DROPS  USE SUGARLESS CANDY INSTEAD (Jolley ranchers or Stover's or Life Savers) or even ice chips will also do - the key is to swallow to prevent all throat clearing. NO OIL BASED VITAMINS - use powdered substitutes  Use the flutter every single time you cough  Please schedule a follow up office visit in 2 weeks, sooner if needed with all meds in hand

## 2015-04-13 NOTE — Progress Notes (Signed)
Subjective:     Patient ID: Ann [name], female   DOB: 1955-09-26,    MRN: [id number]     Brief patient profile:  44 yowm never smoker with whooping cough as child age 59 and ever since then once or twice a year severe cough and fine in between episodes but gradually pattern changed to where cough continually with improved some with  treatment but never better since " I just can't tell ya"  referred to pulmonary clinic 03/30/2015 for clearance for surgery cc worse cough x 01/2015 worst ever. Seen By Dr Halford Chessman 03/08/15 for cough rec Try NeilMed sinus rinse daily Flonase two sprays each nostril daily Sip water when you have urge to cough Salt water gargles once or twice per day Avoid excessive talking Chlorpheniramine 4 mg nightly  Studies done to date  TESTS: PFT 09-28-12 >> FEV1 1.88 (78%), FEV1% 78, TLC 3.95 (80%), DLCO 92%, no BD   03/30/2015  extended ov/Leshaun Biebel re: preop clearance / severe cough and severe obesity  Chief Complaint  Patient presents with  . Advice Only    Pt states needing pulmonary clearance for rectocele surgery. She states that her cough is unchanged or may be worse since last visit.   In mid July 2016 she had an episode in which she thinks she aspirated a piece of a pretzel. This caused coughing and gagging. And persisting assoc with dysphagia > GI eval in progress.  No better with cough drops.  Was intermittently on acei > took last dose ? One day prior to OV   rec nexium 30- 60 min before bfast and prilosec 40 mg  Before supper and pepcid 20 mg at bedtime for now  For drainage / throat tickle try take CHLORPHENIRAMINE  4 mg - take  Up to 2  every 4 hours as needed - available over the counter- may cause drowsiness so start with just a bedtime dose or two and see how you tolerate it before trying in daytime   Take delsym two tsp every 12 hours and supplement if needed with  tramadol 50 mg up to 2 every 4 hours to suppress the urge to cough. Swallowing water or using ice  chips/non mint and menthol containing candies (such as lifesavers or sugarless jolly ranchers) are also effective.  You should rest your voice and avoid activities that you know make you cough. Once you have eliminated the cough for 3 straight days try reducing the tramadol first,  then the delsym as tolerated.  GERD  Use the flutter every single time you cough Please schedule a follow up office visit in 2 weeks, sooner if needed with all meds in hand > did not bring it  Late add bystolic 10 mg daily x 2 week sample given.   04/13/2015  Extended f/u ov/Zaira Iacovelli re: severe cough ? All uacs/onset July 2016 p choking on food ? While on ACEi Chief Complaint  Patient presents with  . Follow-up    Pt states her cough has improved some. No new co's today.   not following instructions as documented above.  Cough remains dry and nonproductive  and mostly daytime   No obvious day to day or daytime variability or assoc sob or cp or chest tightness, subjective wheeze or overt sinus or hb symptoms. No unusual exp hx or h/o childhood pna/ asthma or knowledge of premature birth.    Also denies any obvious fluctuation of symptoms with weather or environmental changes or other aggravating or  alleviating factors except as outlined above   Current Medications, Allergies, Complete Past Medical History, Past Surgical History, Family History, and Social History were reviewed in [employer] record.  ROS  The following are not active complaints unless bolded sore throat, dysphagia, dental problems, itching, sneezing,  nasal congestion or excess/ purulent secretions, ear ache,   fever, chills, sweats, unintended wt loss, classically pleuritic or exertional cp, hemoptysis,  orthopnea pnd or leg swelling, presyncope, palpitations, abdominal pain, anorexia, nausea, vomiting, diarrhea  or change in bowel or bladder habits, change in stools or urine, dysuria,hematuria,  rash, arthralgias, visual  complaints, headache, numbness, weakness or ataxia or problems with walking or coordination,  change in mood/affect or memory.                Objective:   Physical Exam   amb obese wf nad freq throat clearing/ harsh upper airway pattern cough with some rattling   04/14/2015        221    03/30/15 225 lb (102.059 kg)  03/08/15 223 lb 12.8 oz (101.515 kg)  11/21/14 211 lb (95.709 kg)    Vital signs reviewed   HEENT: nl dentition, turbinates, and orophanx. Nl external ear canals without cough reflex   NECK :  without JVD/Nodes/TM/ nl carotid upstrokes bilaterally   LUNGS: no acc muscle use, clear to A and P bilaterally without cough on insp or exp maneuvers   CV:  RRR  no s3 or murmur or increase in P2, no edema   ABD:  soft and nontender with nl excursion in the supine position. No bruits or organomegaly, bowel sounds nl  MS:  warm without deformities, calf tenderness, cyanosis or clubbing  SKIN: warm and dry without lesions    NEURO:  alert, approp, no deficits     I personally reviewed images and agree with radiology impression as follows:  CXR:  02/09/15 No acute cardiopulmonary process.  Labs ordered 04/13/2015 > cbc with diff, allergy profile, tsh     Assessment:

## 2015-04-14 NOTE — Assessment & Plan Note (Addendum)
DDX of  difficult airways management all start with A and  include Adherence, Ace Inhibitors, Acid Reflux, Active Sinus Disease, Alpha 1 Antitripsin deficiency, Anxiety masquerading as Airways dz,  ABPA,  allergy(esp in young), Aspiration (esp in elderly), Adverse effects of meds,  Active smokers, A bunch of PE's (a small clot burden can't cause this syndrome unless there is already severe underlying pulm or vascular dz with poor reserve) plus two Bs  = Bronchiectasis and Beta blocker use..and one C= CHF  Adherence is always the initial "prime suspect" and is a multilayered concern that requires a "trust but verify" approach in every patient - starting with knowing how to use medications, especially inhalers, correctly, keeping up with refills and understanding the fundamental difference between maintenance and prns vs those medications only taken for a very short course and then stopped and not refilled.  - She simply is not following instructions for reasons that are not clear to me as they were explained in detail on her last visit and she was given written instructions accordingly. There is no point in giving additional medications to a patient hasn't followed initial instructions- in other words, there is no point in asking a patient who is not doing what they have been asked to do to correct a problem  to do more than already asked to do.  ? acei effects > explained to patient that this typically lasts up to 6 weeks after the last dose she was never actually able to tell me accurately  ? Acid (or non-acid) GERD > always difficult to exclude as up to 75% of pts in some series report no assoc GI/ Heartburn symptoms> rec continue max (24h)  acid suppression and diet restrictions/ reviewed     ? Allergy > send allergy profile  ? Active Sinus dz > sinus CT  ? BB > should not be a problem on bystolic  I had an extended discussion with the patient (interupted by presence of 2 grandchildren, though not  as much as prev ov) reviewing all relevant studies completed to date and  lasting 25 minutes of a 40 minute visit    Each maintenance medication was reviewed in detail including most importantly the difference between maintenance and prns and under what circumstances the prns are to be triggered using an action plan format that is not reflected in the computer generated alphabetically organized AVS.    Please see instructions for details which were reviewed in writing and the patient given a copy highlighting the part that I personally wrote and discussed at today's ov.

## 2015-04-14 NOTE — Assessment & Plan Note (Signed)
Adequate control on present rx, reviewed > no change in rx needed  > would not rechallenge this pt with acei

## 2015-04-14 NOTE — Assessment & Plan Note (Addendum)
Body mass index is 40.41  No results found for: TSH > requested   Contributing to gerd tendency/ doe/reviewed the need and the process to achieve and maintain neg calorie balance > defer f/u primary care including intermittently monitoring thyroid status

## 2015-04-16 LAB — ALLERGY FULL PROFILE
Allergen, D pternoyssinus,d7: <0.1 kU/L
Allergen,Goose feathers, e70: <0.1 kU/L
Alternaria Alternata: <0.1 kU/L
Aspergillus fumigatus, m3: <0.1 kU/L
Bahia Grass: <0.1 kU/L
Bermuda Grass: <0.1 kU/L
Box Elder IgE: <0.1 kU/L
Candida Albicans: <0.1 kU/L
Cat Dander: <0.1 kU/L
Common Ragweed: <0.1 kU/L
Curvularia lunata: <0.1 kU/L
D. farinae: <0.1 kU/L
Dog Dander: <0.1 kU/L
Elm IgE: <0.1 kU/L
Fescue: <0.1 kU/L
G005 Rye, Perennial: <0.1 kU/L
G009 Red Top: <0.1 kU/L
Goldenrod: <0.1 kU/L
Helminthosporium halodes: <0.1 kU/L
House Dust Hollister: <0.1 kU/L
IgE (Immunoglobulin E), Serum: 93 kU/L (ref <115)
Lamb's Quarters: <0.1 kU/L
Oak: <0.1 kU/L
Plantain: <0.1 kU/L
Stemphylium Botryosum: <0.1 kU/L
Sycamore Tree: <0.1 kU/L
Timothy Grass: <0.1 kU/L

## 2015-04-16 LAB — TSH: TSH: 1.42 u[IU]/mL (ref 0.35–4.50)

## 2015-04-16 NOTE — Progress Notes (Signed)
Quick Note:  Spoke with pt and notified of results per Dr. Wert. Pt verbalized understanding and denied any questions.  ______ 

## 2015-04-18 ENCOUNTER — Ambulatory Visit (INDEPENDENT_AMBULATORY_CARE_PROVIDER_SITE_OTHER)
Admission: RE | Admit: 2015-04-18 | Discharge: 2015-04-18 | Disposition: A | Discharge status: Home / Self Care | Payer: Medicaid Other | Source: Ambulatory Visit | Attending: Internal Medicine | Admitting: Internal Medicine

## 2015-04-18 DIAGNOSIS — R058 Other specified cough: Secondary | ICD-10-CM

## 2015-04-18 DIAGNOSIS — R05 Cough: Secondary | ICD-10-CM | POA: Diagnosis not present

## 2015-04-18 NOTE — Progress Notes (Signed)
Quick Note:  Spoke with pt and notified of results per Dr. Wert. Pt verbalized understanding and denied any questions.  ______ 

## 2015-04-27 ENCOUNTER — Ambulatory Visit (INDEPENDENT_AMBULATORY_CARE_PROVIDER_SITE_OTHER): Payer: Medicaid Other | Admitting: Internal Medicine

## 2015-04-27 ENCOUNTER — Encounter: Payer: Self-pay | Admitting: Internal Medicine

## 2015-04-27 VITALS — BP 122/64 | HR 68 | Ht 62.0 in | Wt 224.0 lb

## 2015-04-27 DIAGNOSIS — R05 Cough: Secondary | ICD-10-CM | POA: Diagnosis not present

## 2015-04-27 DIAGNOSIS — I10 Essential (primary) hypertension: Secondary | ICD-10-CM | POA: Diagnosis not present

## 2015-04-27 DIAGNOSIS — R058 Other specified cough: Secondary | ICD-10-CM

## 2015-04-27 NOTE — Assessment & Plan Note (Addendum)
Allergy profile 04/13/2015 >  Eos 0.1  , IgE 93  neg RAST Sinus CT  04/18/2015> Mild chronic appearing sinusitis s a/f levels  - Flutter valve not using 04/27/2015 > encouraged to continue   Responding to permanent cessation of acei and rx directed at gerd and pnds/ cyclical cough but needs to understand the cyclical nature of the cough and use first the flutter hen the tramadol to prevent airway trauma and reflux from the coughing.   I had an extended summary final  discussion with the patient reviewing all relevant studies completed to date and  lasting 15 to 20 minutes of a 25 minute visit    Each maintenance medication was reviewed in detail including most importantly the difference between maintenance and prns and under what circumstances the prns are to be triggered using an action plan format that is not reflected in the computer generated alphabetically organized AVS.    Please see instructions for details which were reviewed in writing and the patient given a copy highlighting the part that I personally wrote and discussed at today's ov.

## 2015-04-27 NOTE — Assessment & Plan Note (Signed)
Body mass index is 40.96    Lab Results  Component Value Date   TSH 1.42 04/13/2015     Contributing to gerd tendency/ doe/reviewed the need and the process to achieve and maintain neg calorie balance > defer f/u primary care including intermittently monitoring thyroid status

## 2015-04-27 NOTE — Patient Instructions (Addendum)
Continue bystolic until you see Ann [name] and she can take over on your blood pressure management   Don't use tramadol more 5 straight days   Always keep the flutter handy and cough into it to prevent airway trauma   You are cleared for pelvic surgery    If you are satisfied with your treatment plan,  let your doctor know and he/she can either refill your medications or you can return here when your prescription runs out.     If in any way you are not 100% satisfied,  please tell us.  If 100% better, tell your friends!  Pulmonary follow up is as needed    Cleared for sugery per Dr Gaynelle Arabian

## 2015-04-27 NOTE — Progress Notes (Signed)
Subjective:     Patient ID: Ann [name], female   DOB: 1955-12-23,    MRN: [id number]     Brief patient profile:  59 yowm never smoker with whooping cough as child age 59 and ever since then once or twice a year severe cough and fine in between episodes but gradually pattern changed to where cough continually with improved some with  treatment but never better since " I just can't tell ya"  referred to pulmonary clinic 03/30/2015 for clearance for surgery cc worse cough x 01/2015 worst ever. Seen By Ann [name] 03/08/15 for cough rec Try NeilMed sinus rinse daily Flonase two sprays each nostril daily Sip water when you have urge to cough Salt water gargles once or twice per day Avoid excessive talking Chlorpheniramine 4 mg nightly  Studies done to date  TESTS: PFT 09-28-12 >> FEV1 1.88 (78%), FEV1% 78, TLC 3.95 (80%), DLCO 92%, no BD   03/30/2015  extended ov/Ann [name] re: preop clearance / severe cough and severe obesity  Chief Complaint  Patient presents with  . Advice Only    Pt states needing pulmonary clearance for rectocele surgery. She states that her cough is unchanged or may be worse since last visit.   In mid July 2016 she had an episode in which she thinks she aspirated a piece of a pretzel. This caused coughing and gagging. And persisting assoc with dysphagia > GI eval in progress.  No better with cough drops.  Was intermittently on acei > took last dose ? One day prior to OV   rec nexium 30- 60 min before bfast and prilosec 40 mg  Before supper and pepcid 20 mg at bedtime for now  For drainage / throat tickle try take CHLORPHENIRAMINE  4 mg - take  Up to 2  every 4 hours as needed - available over the counter- may cause drowsiness so start with just a bedtime dose or two and see how you tolerate it before trying in daytime   Take delsym two tsp every 12 hours and supplement if needed with  tramadol 50 mg up to 2 every 4 hours .  GERD diet  Use the flutter every single time you  cough Please schedule a follow up office visit in 2 weeks, sooner if needed with all meds in hand > did not bring it  Late add bystolic 10 mg daily x 2 week sample given.   04/05/15 EGD with dilation   04/13/2015  Extended f/u ov/Ann [name] re: severe cough ? All uacs/onset July 2016 p choking on food ? While on ACEi Chief Complaint  Patient presents with  . Follow-up    Pt states her cough has improved some. No new co's today.   not following instructions as documented above.  Cough remains dry and nonproductive  and mostly daytime rec Nexium 30- 60 min before bfast and prilosec 40 mg  Before supper and pepcid 20 mg at bedtime for now  For drainage / throat tickle try take CHLORPHENIRAMINE  4 mg - take  Up to 2  every 4 hours as needed - available over the counter- may cause drowsiness so start with just a bedtime dose or two and see how you tolerate it before trying in daytime   Take delsym two tsp every 12 hours and supplement if needed with  tramadol 50 mg up to 2 every 4 hours    GERD (REFLUX) diet  Use the flutter every single time you cough Please schedule  a follow up office visit in 2 weeks, sooner if needed with all meds in hand    04/27/2015  f/u ov/Ann [name] re: chronic cough/ no flutter on hand / f/u also hbp   Chief Complaint  Patient presents with  . Follow-up    Pt states that her cough has pretty much resolved.      Still cough when using voice a lot, off tramadol x one week / using otc ppi's now before bfast and lunch and pepcid at hs     No obvious day to day or daytime variability or assoc limiting sob or cp or chest tightness, subjective wheeze or overt sinus or hb symptoms. No unusual exp hx or h/o childhood pna/ asthma or knowledge of premature birth.    Also denies any obvious fluctuation of symptoms with weather or environmental changes or other aggravating or alleviating factors except as outlined above   Current Medications, Allergies, Complete Past Medical History,  Past Surgical History, Family History, and Social History were reviewed in [employer] record.  ROS  The following are not active complaints unless bolded sore throat, dysphagia, dental problems, itching, sneezing,  nasal congestion or excess/ purulent secretions, ear ache,   fever, chills, sweats, unintended wt loss, classically pleuritic or exertional cp, hemoptysis,  orthopnea pnd or leg swelling, presyncope, palpitations, abdominal pain, anorexia, nausea, vomiting, diarrhea  or change in bowel or bladder habits, change in stools or urine, dysuria,hematuria,  rash, arthralgias, visual complaints, headache, numbness, weakness or ataxia or problems with walking or coordination,  change in mood/affect or memory.           Objective:   Physical Exam   amb obese wf nad harsh upper airway pattern cough with some rattling when speaking a lot   04/14/2015        221 > 04/27/2015  224     03/30/15 225 lb (102.059 kg)  03/08/15 223 lb 12.8 oz (101.515 kg)  11/21/14 211 lb (95.709 kg)    Vital signs reviewed   HEENT: nl dentition, turbinates, and orophanx. Nl external ear canals without cough reflex   NECK :  without JVD/Nodes/TM/ nl carotid upstrokes bilaterally   LUNGS: no acc muscle use, clear to A and P bilaterally without cough on insp or exp maneuvers   CV:  RRR  no s3 or murmur or increase in P2, no edema   ABD:  soft and nontender with nl excursion in the supine position. No bruits or organomegaly, bowel sounds nl  MS:  warm without deformities, calf tenderness, cyanosis or clubbing  SKIN: warm and dry without lesions    NEURO:  alert, approp, no deficits     I personally reviewed images and agree with radiology impression as follows:  CXR:  02/09/15 No acute cardiopulmonary process.  Sinus CT 04/18/15 Mild chronic appearing sinusitis       Assessment:     Outpatient Encounter Prescriptions as of 04/27/2015  Medication Sig  . amLODipine  (NORVASC) 10 MG tablet Take 10 mg by mouth 2 (two) times daily.  Marland Kitchen aspirin EC 81 MG tablet Take 81 mg by mouth daily.  . chlorpheniramine (CHLOR-TRIMETON) 4 MG tablet Take 1 tablet (4 mg total) by mouth at bedtime.  . clonazePAM (KLONOPIN) 0.5 MG tablet Take 0.5 mg by mouth 3 (three) times daily as needed for anxiety.  . famotidine (PEPCID) 20 MG tablet One at bedtime  . fluticasone (FLONASE) 50 MCG/ACT nasal spray Place 2 sprays into both  nostrils daily.  . furosemide (LASIX) 40 MG tablet Take 40 mg by mouth daily as needed.   Marland Kitchen ibuprofen (ADVIL,MOTRIN) 800 MG tablet Take 800 mg by mouth every 8 (eight) hours as needed for headache, mild pain or moderate pain.  . montelukast (SINGULAIR) 10 MG tablet Take 10 mg by mouth at bedtime.  . Multiple Vitamin (MULTIVITAMIN WITH MINERALS) TABS tablet Take 1 tablet by mouth daily.  . nebivolol (BYSTOLIC) 10 MG tablet Take 1 tablet (10 mg total) by mouth daily.  Marland Kitchen omeprazole (PRILOSEC) 40 MG capsule Take 40 mg by mouth 2 (two) times daily.  Marland Kitchen Respiratory Therapy Supplies (FLUTTER) DEVI Use as directed  . topiramate (TOPAMAX) 50 MG tablet Take 50 mg by mouth 2 (two) times daily.  . traMADol (ULTRAM) 50 MG tablet 1-2 every 4 hours as needed for cough or pain   No facility-administered encounter medications on file as of 04/27/2015.

## 2015-04-27 NOTE — Assessment & Plan Note (Signed)
Off ACEi since sometime in 03/2015 due to cough  > improved 04/27/2015 so avoid acei indefinitely   Strongly prefer in this setting: Bystolic, the most beta -1  selective Beta blocker available in sample form, with bisoprolol the most selective generic choice  on the market.  Will give samples of bisoprolol 10 mg daily and Follow up per Primary Care planned

## 2015-07-03 ENCOUNTER — Telehealth: Payer: Self-pay | Admitting: Gastroenterology

## 2015-07-03 NOTE — Telephone Encounter (Signed)
Left message for patient to call back  

## 2015-07-03 NOTE — Telephone Encounter (Signed)
Yes that's fine thanks, please schedule for EGD

## 2015-07-03 NOTE — Telephone Encounter (Signed)
Patient reports that she is having continued dysphagia.  Is it ok to set up repeat EGD dil as recommended in EGD report 9/16?

## 2015-07-04 NOTE — Telephone Encounter (Signed)
Patient is scheduled for 07/16/15 and pre-visit tomorrow She is aware of both appts

## 2015-07-05 ENCOUNTER — Ambulatory Visit (AMBULATORY_SURGERY_CENTER): Payer: Self-pay | Admitting: *Deleted

## 2015-07-05 VITALS — Ht 62.0 in | Wt 222.0 lb

## 2015-07-05 DIAGNOSIS — R1310 Dysphagia, unspecified: Secondary | ICD-10-CM

## 2015-07-05 NOTE — Progress Notes (Signed)
No egg or soy allergy known to patient  No issues with past sedation with any surgeries  or procedures, no intubation problems- pt states if they intubate her first no issues but has hx of aspiration with a surgery in the past .  Pt states last egd 04-05-15 she had severe pain when the propofol was administered and she had severe pain into her chest and she felt like she was having a heart attack. She did recover well with no issues.  No diet pills per pt. No home 02 use per patient

## 2015-07-16 ENCOUNTER — Encounter: Payer: Medicaid Other | Admitting: Gastroenterology

## 2015-07-30 ENCOUNTER — Encounter: Payer: Self-pay | Admitting: Gastroenterology

## 2015-07-30 ENCOUNTER — Ambulatory Visit (AMBULATORY_SURGERY_CENTER): Payer: Medicaid Other | Admitting: Gastroenterology

## 2015-07-30 ENCOUNTER — Encounter: Payer: Medicaid Other | Admitting: Gastroenterology

## 2015-07-30 VITALS — BP 129/61 | HR 67 | Temp 98.1°F | Resp 12 | Ht 62.0 in | Wt 222.0 lb

## 2015-07-30 DIAGNOSIS — R1310 Dysphagia, unspecified: Secondary | ICD-10-CM

## 2015-07-30 DIAGNOSIS — K222 Esophageal obstruction: Secondary | ICD-10-CM

## 2015-07-30 MED ORDER — SODIUM CHLORIDE 0.9 % IV SOLN: 500.0000 mL | INTRAVENOUS | Status: DC

## 2015-07-30 NOTE — Progress Notes (Signed)
Dental advisory given to patient 

## 2015-07-30 NOTE — Patient Instructions (Signed)
REPEAT ENDOSCOPY IN 4 WEEKS IF SYMPTOMS PERSIST.   YOU HAD AN ENDOSCOPIC PROCEDURE TODAY AT THE [facility]:   Refer to the procedure report that was given to you for any specific questions about what was found during the examination.  If the procedure report does not answer your questions, please call your gastroenterologist to clarify.  If you requested that your care partner not be given the details of your procedure findings, then the procedure report has been included in a sealed envelope for you to review at your convenience later.  YOU SHOULD EXPECT: Some feelings of bloating in the abdomen. Passage of more gas than usual.  Walking can help get rid of the air that was put into your GI tract during the procedure and reduce the bloating. If you had a lower endoscopy (such as a colonoscopy or flexible sigmoidoscopy) you may notice spotting of blood in your stool or on the toilet paper. If you underwent a bowel prep for your procedure, you may not have a normal bowel movement for a few days.  Please Note:  You might notice some irritation and congestion in your nose or some drainage.  This is from the oxygen used during your procedure.  There is no need for concern and it should clear up in a day or so.  SYMPTOMS TO REPORT IMMEDIATELY:    Following upper endoscopy (EGD)  Vomiting of blood or coffee ground material  New chest pain or pain under the shoulder blades  Painful or persistently difficult swallowing  New shortness of breath  Fever of 100F or higher  Black, tarry-looking stools  For urgent or emergent issues, a gastroenterologist can be reached at any hour by calling [phone].   DIET: NOTHING TO EAT OR DRINK UNTIL 9:00. 9:00 UNTIL 10:00 ONLY CLEAR LIQUIDS. 10:00 AM UNTIL TOMORROW MORNING ONLY SOFT FOODS. RESUME YOUR DIET IN AM.  ACTIVITY:  You should plan to take it easy for the rest of today and you should NOT DRIVE or use heavy machinery until tomorrow  (because of the sedation medicines used during the test).    FOLLOW UP: Our staff will call the number listed on your records the next business day following your procedure to check on you and address any questions or concerns that you may have regarding the information given to you following your procedure. If we do not reach you, we will leave a message.  However, if you are feeling well and you are not experiencing any problems, there is no need to return our call.  We will assume that you have returned to your regular daily activities without incident.  If any biopsies were taken you will be contacted by phone or by letter within the next 1-3 weeks.  Please call us at [phone] if you have not heard about the biopsies in 3 weeks.    SIGNATURES/CONFIDENTIALITY: You and/or your care partner have signed paperwork which will be entered into your electronic medical record.  These signatures attest to the fact that that the information above on your After Visit Summary has been reviewed and is understood.  Full responsibility of the confidentiality of this discharge information lies with you and/or your care-partner.

## 2015-07-30 NOTE — Progress Notes (Signed)
Called to room to assist during endoscopic procedure.  Patient ID and intended procedure confirmed with present staff. Received instructions for my participation in the procedure from the performing physician.  

## 2015-07-30 NOTE — Progress Notes (Signed)
Patient denies any allergies to eggs or soy. 

## 2015-07-30 NOTE — Progress Notes (Signed)
A/ox3 pleased with MAC, report to Emaree RN 

## 2015-07-30 NOTE — Progress Notes (Signed)
Pt expresses concern about pain on propofol administration, these concerns noted, will slow administration

## 2015-07-30 NOTE — Op Note (Signed)
[place]  [address] [place], [address]   ENDOSCOPY PROCEDURE REPORT  PATIENT: Ann [name], Ann [name]  MR#: [id number] BIRTHDATE: May 20, 1956 , 60  yrs. old GENDER: female ENDOSCOPIST: Yetta Flock, MD REFERRED BY: PROCEDURE DATE:  07/30/2015 PROCEDURE:  EGD w/ balloon dilation and EGD w/ biopsy ASA CLASS:     Class III INDICATIONS:  dysphagia. MEDICATIONS: Propofol 250 mg IV TOPICAL ANESTHETIC:  DESCRIPTION OF PROCEDURE: After the risks benefits and alternatives of the procedure were thoroughly explained, informed consent was obtained.  The LB [id number] endoscope was introduced through the mouth and advanced to the second portion of the duodenum , Without limitations.  The instrument was slowly withdrawn as the mucosa was fully examined.   FINDINGS: The esophagus was normal in appearance.  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  The [facility] was noted at 37cm from the incisors, with GEJ and SCJ located 33cm from the incisors, with a 4cm hiatal hernia. The stomach was normal in appearance, with the duodenal bulb and 2nd portion of the duodenum appearing normal.  A TTS balloon was inflated to 28mm and then 27mm after which a moderate mucosal wrent was noted at the stricture.  Biopsies were then taken circumferentially around the stricture to open it up further.  The endoscope was then withdrawn.  Retroflexed views revealed a hiatal hernia.     The scope was then withdrawn from the patient and the procedure completed.  COMPLICATIONS: There were no immediate complications.  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 The stricture was then biopsied 4cm hiatal hernia Normal stomach Normal duodenum  RECOMMENDATIONS: Await pathology results Resume medications Soft diet today Follow up for repeat EGD in 4 weeks if symptoms persist   eSigned:   Yetta Flock, MD 07/30/2015 7:57 AM    CC: the patient  PATIENT NAME:  Ann [name], Ann [name] MR#: [id number]

## 2015-07-31 ENCOUNTER — Telehealth: Payer: Self-pay

## 2015-07-31 NOTE — Telephone Encounter (Signed)
  Follow up Call-  Call back number 07/30/2015 04/05/2015 06/07/2014  Post procedure Call Back phone  # [phone]  Permission to leave phone message Yes Yes Yes    Patient was contacted for follow up phone call after procedure on 07/30/2015. Patient states that she is doing ok, but she does have a soreness in her throat. I ask the patient if she wanted me to send a note to Dr. Havery Moros, and she said no that she would give a couple of days to see if the soreness improved. Patient states she has continued to have soft food and liquids. Patient states that she is not in pain. Voice is raspy. I instructed the patient to call us in a few days if she hasn't improved.   Patient questions:  Do you have a fever, pain , or abdominal swelling? No. Pain Score  0 *  Have you tolerated food without any problems? Yes.    Have you been able to return to your normal activities? No.  Do you have any questions about your discharge instructions: Diet   No. Medications  No. Follow up visit  No.  Do you have questions or concerns about your Care? No.  Actions: * If pain score is 4 or above: No action needed, pain <4.

## 2015-08-06 ENCOUNTER — Telehealth: Payer: Self-pay | Admitting: Gastroenterology

## 2015-08-06 NOTE — Telephone Encounter (Signed)
Spoke with patient and reviewed pathology report again.

## 2015-08-15 ENCOUNTER — Ambulatory Visit (AMBULATORY_SURGERY_CENTER): Payer: Self-pay

## 2015-08-15 VITALS — Ht 62.0 in | Wt 229.8 lb

## 2015-08-15 DIAGNOSIS — R1319 Other dysphagia: Secondary | ICD-10-CM

## 2015-08-15 NOTE — Progress Notes (Signed)
No allergies to eggs or soy No diet/weight loss meds No home oxygen  BAD EXPERIENCE WITH PROPOFOL.  BURNED SEVERELY UPON FIRST ADMINISTRATION.  PLEASE PUSH SLOWLY.  SISTER DIED AFTER GENERAL ANESTHESIA  HAS BEEN INTUBATED (NOT ROUTINELY) AFTER "CODE BLUE"X2  Has email and internet; refused emmi has had EGD numerous times

## 2015-08-22 ENCOUNTER — Ambulatory Visit (AMBULATORY_SURGERY_CENTER): Payer: Medicaid Other | Admitting: Gastroenterology

## 2015-08-22 ENCOUNTER — Encounter: Payer: Self-pay | Admitting: Gastroenterology

## 2015-08-22 VITALS — BP 113/74 | HR 59 | Temp 97.7°F | Resp 15 | Ht 62.0 in | Wt 224.0 lb

## 2015-08-22 DIAGNOSIS — K222 Esophageal obstruction: Secondary | ICD-10-CM

## 2015-08-22 DIAGNOSIS — R1319 Other dysphagia: Secondary | ICD-10-CM | POA: Diagnosis not present

## 2015-08-22 MED ORDER — SODIUM CHLORIDE 0.9 % IV SOLN: 500.0000 mL | INTRAVENOUS | Status: DC

## 2015-08-22 NOTE — Op Note (Signed)
[place]  [address]. [place], [address]   ENDOSCOPY PROCEDURE REPORT  PATIENT: Ann, [name]  MR#: [id number] BIRTHDATE: 01/31/1956 , 78  yrs. old GENDER: female ENDOSCOPIST: Yetta Flock, MD REFERRED BY: PROCEDURE DATE:  08/22/2015 PROCEDURE:  EGD w/ balloon dilation and EGD w/ biopsy ASA CLASS:     Class III INDICATIONS:  dysphagia, history of GEJ stricture, last dilated to 23mm and biopsies taken which showed "adenomatous change". here for repeat endoscopy in light of pathology results to ensure no dysplasia or Barrett's. MEDICATIONS: Propofol 280 mg IV TOPICAL ANESTHETIC:  DESCRIPTION OF PROCEDURE: After the risks benefits and alternatives of the procedure were thoroughly explained, informed consent was obtained.  The LB [id number] [id number] endoscope was introduced through the mouth and advanced to the second portion of the duodenum , Without limitations.  The instrument was slowly withdrawn as the mucosa was fully examined.   FINDINGS: The esophagus was normal in appearance.  A mild stenosis was noted at the GEJ.  DH noted 38cm from the incisors, with GEJ and SCJ located 35cm from the incisors, with a 3cm hiatal hernia. The SCJ was slightly irregular but without any obvious nodularity or atypical mucosa.  A TTS balloon was placed and dilated the stricture at 52mm following which a moderate mucosal wrent with heme was noted.  No further dilation was performed in light of this finding.  Muliple biopsies were taken throughout the stricture / GEJ in light of pathology findings on the last exam to rule out any dysplastic changes or BE.  The stomach was normal.  The duodenal bulb and 2nd portion of the duodenum was normal.  Retroflexed views revealed a hiatal hernia.     The scope was then withdrawn from the patient and the procedure completed.  COMPLICATIONS: There were no immediate complications.  ENDOSCOPIC IMPRESSION: 3cm hiatal  hernia Mild GEJ stricture, as described above, dilated to 21mm and biopsies obtained Normal stomach and duodenum  RECOMMENDATIONS: Await pathology results Resume diet Resume medications Schedule colonoscopy as discussed for screening purposes if you have not had a prior colonoscopy and family history of colon cancer     eSigned:  Yetta Flock, MD 08/22/2015 3:24 PM    CC: the patient  PATIENT NAME:  Ann, [name] MR#: [id number]

## 2015-08-22 NOTE — Progress Notes (Signed)
Called to room to assist during endoscopic procedure.  Patient ID and intended procedure confirmed with present staff. Received instructions for my participation in the procedure from the performing physician.  

## 2015-08-22 NOTE — Patient Instructions (Signed)
Impressions/recommendations:  Hiatal hernia (handout given) Stricture (handout given)  Dilation diet handout given  YOU HAD AN ENDOSCOPIC PROCEDURE TODAY AT [facility]:   Refer to the procedure report that was given to you for any specific questions about what was found during the examination.  If the procedure report does not answer your questions, please call your gastroenterologist to clarify.  If you requested that your care partner not be given the details of your procedure findings, then the procedure report has been included in a sealed envelope for you to review at your convenience later.  YOU SHOULD EXPECT: Some feelings of bloating in the abdomen. Passage of more gas than usual.  Walking can help get rid of the air that was put into your GI tract during the procedure and reduce the bloating. If you had a lower endoscopy (such as a colonoscopy or flexible sigmoidoscopy) you may notice spotting of blood in your stool or on the toilet paper. If you underwent a bowel prep for your procedure, you may not have a normal bowel movement for a few days.  Please Note:  You might notice some irritation and congestion in your nose or some drainage.  This is from the oxygen used during your procedure.  There is no need for concern and it should clear up in a day or so.  SYMPTOMS TO REPORT IMMEDIATELY:   ollowing upper endoscopy (EGD)  Vomiting of blood or coffee ground material  New chest pain or pain under the shoulder blades  Painful or persistently difficult swallowing  New shortness of breath  Fever of 100F or higher  Black, tarry-looking stools  For urgent or emergent issues, a gastroenterologist can be reached at any hour by calling [phone].   DIET: Your first meal following the procedure should be a small meal and then it is ok to progress to your normal diet. Heavy or fried foods are harder to digest and may make you feel nauseous or bloated.  Likewise, meals  heavy in dairy and vegetables can increase bloating.  Drink plenty of fluids but you should avoid alcoholic beverages for 24 hours.  ACTIVITY:  You should plan to take it easy for the rest of today and you should NOT DRIVE or use heavy machinery until tomorrow (because of the sedation medicines used during the test).    FOLLOW UP: Our staff will call the number listed on your records the next business day following your procedure to check on you and address any questions or concerns that you may have regarding the information given to you following your procedure. If we do not reach you, we will leave a message.  However, if you are feeling well and you are not experiencing any problems, there is no need to return our call.  We will assume that you have returned to your regular daily activities without incident.  If any biopsies were taken you will be contacted by phone or by letter within the next 1-3 weeks.  Please call us at [phone] if you have not heard about the biopsies in 3 weeks.    SIGNATURES/CONFIDENTIALITY: You and/or your care partner have signed paperwork which will be entered into your electronic medical record.  These signatures attest to the fact that that the information above on your After Visit Summary has been reviewed and is understood.  Full responsibility of the confidentiality of this discharge information lies with you and/or your care-partner.

## 2015-08-22 NOTE — Progress Notes (Signed)
To pacu vss patent aw report to rn 

## 2015-08-23 ENCOUNTER — Telehealth: Payer: Self-pay

## 2015-08-23 ENCOUNTER — Telehealth: Payer: Self-pay | Admitting: *Deleted

## 2015-08-23 NOTE — Telephone Encounter (Signed)
  Follow up Call-  Call back number 08/22/2015 07/30/2015 04/05/2015 06/07/2014  Post procedure Call Back phone  # [phone]  Permission to leave phone message Yes Yes Yes Yes     Patient questions:  Do you have a fever, pain , or abdominal swelling? No. Pain Score  0 *  Have you tolerated food without any problems? Yes.    Have you been able to return to your normal activities? Yes.    Do you have any questions about your discharge instructions: Diet   No. Medications  No. Follow up visit  No.  Do you have questions or concerns about your Care? No.  Actions: * If pain score is 4 or above: No action needed, pain <4.

## 2015-08-23 NOTE — Telephone Encounter (Signed)
Per procedure report, patient needs screening colonoscopy. Scheduled procedure with patient on 09/26/15 at 8:00 AM and pre visit on 09/19/15 at 10:30 AM.

## 2015-08-26 ENCOUNTER — Encounter (HOSPITAL_COMMUNITY): Payer: Self-pay | Admitting: Emergency Medicine

## 2015-08-26 ENCOUNTER — Emergency Department (HOSPITAL_COMMUNITY)
Admission: EM | Admit: 2015-08-26 | Discharge: 2015-08-27 | Disposition: A | Discharge status: Home / Self Care | Payer: Medicaid Other | Attending: Emergency Medicine | Admitting: Emergency Medicine

## 2015-08-26 ENCOUNTER — Emergency Department (HOSPITAL_COMMUNITY): Payer: Medicaid Other

## 2015-08-26 DIAGNOSIS — R110 Nausea: Secondary | ICD-10-CM

## 2015-08-26 DIAGNOSIS — Z9981 Dependence on supplemental oxygen: Secondary | ICD-10-CM | POA: Insufficient documentation

## 2015-08-26 DIAGNOSIS — I129 Hypertensive chronic kidney disease with stage 1 through stage 4 chronic kidney disease, or unspecified chronic kidney disease: Secondary | ICD-10-CM | POA: Insufficient documentation

## 2015-08-26 DIAGNOSIS — Z87440 Personal history of urinary (tract) infections: Secondary | ICD-10-CM | POA: Diagnosis not present

## 2015-08-26 DIAGNOSIS — G4733 Obstructive sleep apnea (adult) (pediatric): Secondary | ICD-10-CM | POA: Insufficient documentation

## 2015-08-26 DIAGNOSIS — K5733 Diverticulitis of large intestine without perforation or abscess with bleeding: Secondary | ICD-10-CM | POA: Diagnosis not present

## 2015-08-26 DIAGNOSIS — Z8614 Personal history of Methicillin resistant Staphylococcus aureus infection: Secondary | ICD-10-CM | POA: Insufficient documentation

## 2015-08-26 DIAGNOSIS — R0609 Other forms of dyspnea: Secondary | ICD-10-CM | POA: Diagnosis not present

## 2015-08-26 DIAGNOSIS — Z8673 Personal history of transient ischemic attack (TIA), and cerebral infarction without residual deficits: Secondary | ICD-10-CM | POA: Insufficient documentation

## 2015-08-26 DIAGNOSIS — K219 Gastro-esophageal reflux disease without esophagitis: Secondary | ICD-10-CM | POA: Diagnosis not present

## 2015-08-26 DIAGNOSIS — R0602 Shortness of breath: Secondary | ICD-10-CM | POA: Insufficient documentation

## 2015-08-26 DIAGNOSIS — Z7982 Long term (current) use of aspirin: Secondary | ICD-10-CM | POA: Diagnosis not present

## 2015-08-26 DIAGNOSIS — N183 Chronic kidney disease, stage 3 (moderate): Secondary | ICD-10-CM | POA: Diagnosis not present

## 2015-08-26 DIAGNOSIS — R350 Frequency of micturition: Secondary | ICD-10-CM | POA: Diagnosis not present

## 2015-08-26 DIAGNOSIS — Z7951 Long term (current) use of inhaled steroids: Secondary | ICD-10-CM | POA: Diagnosis not present

## 2015-08-26 DIAGNOSIS — M1990 Unspecified osteoarthritis, unspecified site: Secondary | ICD-10-CM | POA: Diagnosis not present

## 2015-08-26 DIAGNOSIS — Z8639 Personal history of other endocrine, nutritional and metabolic disease: Secondary | ICD-10-CM | POA: Insufficient documentation

## 2015-08-26 DIAGNOSIS — R195 Other fecal abnormalities: Secondary | ICD-10-CM

## 2015-08-26 DIAGNOSIS — Z79899 Other long term (current) drug therapy: Secondary | ICD-10-CM | POA: Insufficient documentation

## 2015-08-26 DIAGNOSIS — K921 Melena: Secondary | ICD-10-CM

## 2015-08-26 DIAGNOSIS — R109 Unspecified abdominal pain: Secondary | ICD-10-CM

## 2015-08-26 DIAGNOSIS — F419 Anxiety disorder, unspecified: Secondary | ICD-10-CM | POA: Diagnosis not present

## 2015-08-26 DIAGNOSIS — R1030 Lower abdominal pain, unspecified: Secondary | ICD-10-CM | POA: Diagnosis present

## 2015-08-26 LAB — URINALYSIS, ROUTINE W REFLEX MICROSCOPIC
Bilirubin Urine: NEGATIVE
GLUCOSE, UA: NEGATIVE mg/dL
Hgb urine dipstick: NEGATIVE
Ketones, ur: NEGATIVE mg/dL
NITRITE: NEGATIVE
PH: 5.5 (ref 5.0–8.0)
Protein, ur: NEGATIVE mg/dL
SPECIFIC GRAVITY, URINE: 1.021 (ref 1.005–1.030)

## 2015-08-26 LAB — CBC
HCT: 45.6 % (ref 36.0–46.0)
Hemoglobin: 14.5 g/dL (ref 12.0–15.0)
MCH: 31.4 pg (ref 26.0–34.0)
MCHC: 31.8 g/dL (ref 30.0–36.0)
MCV: 98.7 fL (ref 78.0–100.0)
Platelets: 309 10*3/uL (ref 150–400)
RBC: 4.62 MIL/uL (ref 3.87–5.11)
RDW: 13.6 % (ref 11.5–15.5)
WBC: 8.7 10*3/uL (ref 4.0–10.5)

## 2015-08-26 LAB — LIPASE, BLOOD: Lipase: 24 U/L (ref 11–51)

## 2015-08-26 LAB — COMPREHENSIVE METABOLIC PANEL
ALBUMIN: 4.1 g/dL (ref 3.5–5.0)
ALK PHOS: 73 U/L (ref 38–126)
ALT: 20 U/L (ref 14–54)
AST: 21 U/L (ref 15–41)
Anion gap: 9 (ref 5–15)
BILIRUBIN TOTAL: 0.4 mg/dL (ref 0.3–1.2)
BUN: 17 mg/dL (ref 6–20)
CALCIUM: 9.2 mg/dL (ref 8.9–10.3)
CO2: 25 mmol/L (ref 22–32)
CREATININE: 1.05 mg/dL — AB (ref 0.44–1.00)
Chloride: 109 mmol/L (ref 101–111)
GFR calc Af Amer: >60 mL/min (ref >60)
GFR calc non Af Amer: 58 mL/min — ABNORMAL LOW (ref >60)
GLUCOSE: 109 mg/dL — AB (ref 65–99)
Potassium: 3.8 mmol/L (ref 3.5–5.1)
SODIUM: 143 mmol/L (ref 135–145)
Total Protein: 8.1 g/dL (ref 6.5–8.1)

## 2015-08-26 LAB — URINE MICROSCOPIC-ADD ON: RBC / HPF: NONE SEEN RBC/hpf (ref 0–5)

## 2015-08-26 MED ORDER — SODIUM CHLORIDE 0.9 % IV BOLUS (SEPSIS)
1000.0000 mL | Freq: Once | INTRAVENOUS | Status: AC
  Administered 2015-08-26: 1000 mL via INTRAVENOUS

## 2015-08-26 MED ORDER — ONDANSETRON HCL 4 MG/2ML IJ SOLN
4.0000 mg | Freq: Once | INTRAMUSCULAR | Status: AC
  Administered 2015-08-26: 4 mg via INTRAVENOUS
  Filled 2015-08-26: qty 2

## 2015-08-26 MED ORDER — IOHEXOL 300 MG/ML  SOLN
50.0000 mL | Freq: Once | INTRAMUSCULAR | Status: AC | PRN
  Administered 2015-08-26: 50 mL via ORAL

## 2015-08-26 MED ORDER — MORPHINE SULFATE (PF) 4 MG/ML IV SOLN
4.0000 mg | Freq: Once | INTRAVENOUS | Status: AC
  Administered 2015-08-26: 4 mg via INTRAVENOUS
  Filled 2015-08-26: qty 1

## 2015-08-26 NOTE — ED Notes (Signed)
Pt c/o SOB with exertion and lower abdominal pain x 3 days. Hx of diverticulitis and kidney stones that have both presented similarly. Also had throat surgery on Wednesday, then two days ago began to have trouble swallowing. Multiple complaints. Pt states she came here today for the unbearable abdominal pain that "feels like labor." Pt states she's having bloody mucus in her stool, denies difficulty/pain with urination.

## 2015-08-26 NOTE — ED Provider Notes (Signed)
CSN: [id number]     Arrival date & time 08/26/15  1733 History   First MD Initiated Contact with Patient 08/26/15 2157     Chief Complaint  Patient presents with  . Abdominal Pain     (Consider location/radiation/quality/duration/timing/severity/associated sxs/prior Treatment) HPI Comments: Ann [name] is a 60 y.o. female with a PMHx of nephrolithiasis, hemorrhoids, HTN, HLD, GERD, recurrent UTIs, hiatal hernia, dysphagia/achalasia s/p multiple esophageal dilatations , diverticulosis, stress urinary incontinence, chronic cystitis, borderline DM, anxiety, neuropathy, chronic headaches, and CKD3 as well as other medical issues listed below, with a PSHx of rectocele repair, transurethral sling and vaginal vault repair, esophageal dilatations, and cystoscopy with stent placements, who presents to the ED with multiple complaints. Her primary complaint is 3 days of suprapubic abdominal pain which she describes as 7/10 pressure-like pain, constant, radiating into the lower back, worse with sitting up, and unrelieved with ibuprofen and Tylenol. She reports that this is similar to when she has had diverticulitis and nephrolithiasis before. Associated symptoms include mucousy stools as well as bright red blood on the toilet paper when she wipes. She also reports nausea and increased urinary frequency and hesitancy. She recently had an EGD (under propofol, no general anesthesia) on 08/22/15 for esophageal dilation and biopsies of adenomatous changes found from prior EGD, performed by Dr. Havery Moros at [place]. She does take NSAIDs regularly.  Her other complaint is several weeks of shortness of breath only with exertion, not at rest. She has a chronic cough for which she sees Dr. Melvyn Novas at [facility] pulmonology. No acute changes in either of these complaints. She denies any leg swelling, recent travel/immobilization, no recent major surgeries, hx of DVT/PE, estrogen use, active CA, diaphoresis, CP, worsening cough,  worsening wheezing, or lightheadedness.   She denies any fevers, chills, CP, SOB at rest, vomiting, diarrhea, constipation, rectal pain, melena, obstipation, dysuria, hematuria, vaginal bleeding/discharge, numbness, tingling, or weakness. No recent travel, sick contacts, suspicious food intake, or EtOH use. She states this doesn't feel like her prior UTIs.  Patient is a 60 y.o. female presenting with abdominal pain. The history is provided by the patient and medical records. No language interpreter was used.  Abdominal Pain Pain location:  Suprapubic Pain quality: pressure   Pain radiates to:  Back Pain severity:  Moderate Onset quality:  Gradual Duration:  3 days Timing:  Constant Progression:  Unchanged Chronicity:  Recurrent Context: not recent travel, not sick contacts and not suspicious food intake   Relieved by:  Nothing Worsened by:  Position changes Ineffective treatments:  Acetaminophen and NSAIDs Associated symptoms: hematochezia (only on paper with wiping), nausea and shortness of breath (only with exertion, none at rest)   Associated symptoms: no chest pain, no chills, no constipation, no diarrhea, no dysuria, no fever, no flatus, no hematemesis, no hematuria, no melena, no vaginal bleeding, no vaginal discharge and no vomiting  Cough: chronic, unchanged.   Associated symptoms comment:  +mucousy stools Risk factors: multiple surgeries, NSAID use and obesity     Past Medical History  Diagnosis Date  . Hypertension   . Arthritis   . GERD (gastroesophageal reflux disease)   . Hyperlipidemia   . History of recurrent UTIs   . H/O hiatal hernia   . History of esophageal dilatation   . History of TIAs NO RESIDUAL    1980;  2005;   2008 PT STATES PER CT  SCARRING RIGHT SIDE OF BRAIN  . Diverticulosis of colon   . SUI (stress urinary  incontinence, female)   . Chronic cystitis   . OSA on CPAP     PER SLEEP STUDY 09-08-2012  MODERATE OSA  . Borderline diabetes   . Nocturia    . Anxiety   . Neuropathy ([place])     pt denies this 07-05-2015  . Headache(784.0)     severe headaches   . Complication of anesthesia     02-20-2014 ([facility]) INTRA-OP RESPIRATORY FAILURE SECONDARY TO POSSIBLE MUCOUS ASPIRATION/  ALSO 06-06-2013 [facility]) POST-OP DESATURATION  EVEN USING CPAP  . Family history of adverse reaction to anesthesia     PER PT SISTER DIED AFTER GENERAL ANESTHESIA DUE TO UNDIAGNOSED OSA  . History of MRSA infection     3/ 2015  AXILLARY ABSCESS  . Neuromuscular disorder ([place])     HH  . Sleep apnea   . Allergy     seasonal  . Stroke ([place])     TIA'S  . Kidney disease     III   Past Surgical History  Procedure Laterality Date  . Knee arthroscopy Bilateral 2008  . Esophageal dilation  X2  LAST ONE  JUNE 2014  . Tubal ligation  1987  . Anterior and posterior repair N/A 06/06/2013    Procedure: Anterior vaginal vault repair, Sacrospinous ligament fixation with UPHOLD lite, Kelly plication, Sacrospinous mesh fixation, Solyx transurethral sling;  Surgeon: Ailene Rud, MD;  Location: [facility];  Service: Urology;  Laterality: N/A;  . Rectocele repair N/A 02/20/2014    Procedure: POSTERIOR REPAIR (RECTOCELE) WITH VAGINAL VAULT REPAIR WITH ACELL GRAFT PLACEMENT, PERINEOPLASTY, ENTEROCELE REPAIR;  Surgeon: Ailene Rud, MD;  Location: [facility];  Service: Urology;  Laterality: N/A;  . Cystoscopy w/ ureteral stent placement Right 04/21/2014    Procedure: CYSTOSCOPY WITH RETROGRADE PYELOGRAM/URETERAL STENT PLACEMENT;  Surgeon: Ailene Rud, MD;  Location: [facility];  Service: Urology;  Laterality: Right;  . Cystoscopy with retrograde pyelogram, ureteroscopy and stent placement Right 06/05/2014    Procedure: CYSTOSCOPY WITH RIGHT RETROGRADE PYELOGRAM, RIGHT URETEROSCOPY, STONE EXTRACTION AND RIGHT DOUBLE J STENT PLACEMENT;  Surgeon: Ailene Rud, MD;  Location: [facility];  Service: Urology;  Laterality: Right;  . Holmium  laser application Right [id number]    Procedure: HOLMIUM LASER OF STONE ;  Surgeon: Ailene Rud, MD;  Location: [facility];  Service: Urology;  Laterality: Right;  . Cystoscopy w/ ureteral stent placement Right 11/21/2014    Procedure: CYSTOSCOPY WITH RETROGRADE PYELOGRAM, URETEROSCOPY WITH STONE REMOVAL, URETERAL STENT PLACEMENT;  Surgeon: Carolan Clines, MD;  Location: [facility];  Service: Urology;  Laterality: Right;   Family History  Problem Relation Age of Onset  . Colon cancer Father 20    died at 24  . Esophageal cancer Neg Hx   . Rectal cancer Neg Hx   . Stomach cancer Neg Hx   . Colon polyps Neg Hx   . Heart disease Mother    Social History  Substance Use Topics  . Smoking status: Never Smoker   . Smokeless tobacco: Never Used  . Alcohol Use: No   OB History    No data available     Review of Systems  Constitutional: Negative for fever, chills and diaphoresis.  Respiratory: Positive for shortness of breath (only with exertion, none at rest). Cough: chronic, unchanged.   Cardiovascular: Negative for chest pain and leg swelling.  Gastrointestinal: Positive for nausea, abdominal pain, hematochezia (only on paper with wiping) and anal bleeding (BRBPR on paper  with wiping). Negative for vomiting, diarrhea, constipation, blood in stool, melena, rectal pain, flatus and hematemesis.  Genitourinary: Positive for frequency. Negative for dysuria, hematuria, flank pain, vaginal bleeding and vaginal discharge.       +urinary hesitancy  Musculoskeletal: Negative for myalgias and arthralgias.  Skin: Negative for color change.  Allergic/Immunologic: Negative for immunocompromised state.  Neurological: Negative for weakness, light-headedness and numbness.  Psychiatric/Behavioral: Negative for confusion.   10 Systems reviewed and are negative for acute change except as noted in the HPI.    Allergies  Ace inhibitors and Metronidazole  Home Medications    Prior to Admission medications   Medication Sig Start Date End Date Taking? Authorizing Provider  amLODipine (NORVASC) 10 MG tablet Take 10 mg by mouth 2 (two) times daily.    Historical Provider, MD  aspirin EC 81 MG tablet Take 81 mg by mouth daily.    Historical Provider, MD  chlorpheniramine (CHLOR-TRIMETON) 4 MG tablet Take 1 tablet (4 mg total) by mouth at bedtime. 03/08/15   Chesley Mires, MD  clonazePAM (KLONOPIN) 0.5 MG tablet Take 0.5 mg by mouth 3 (three) times daily as needed for anxiety.    Historical Provider, MD  esomeprazole (NEXIUM) 40 MG capsule TAKE ONE CAPSULE BY MOUTH TWICE DAILY 05/07/15   Historical Provider, MD  famotidine (PEPCID) 20 MG tablet One at bedtime 03/30/15   Tanda Rockers, MD  fluticasone [facility]) 50 MCG/ACT nasal spray Place 2 sprays into both nostrils daily. 03/08/15   Chesley Mires, MD  furosemide (LASIX) 40 MG tablet Take 40 mg by mouth daily as needed.  03/06/15   Historical Provider, MD  ibuprofen (ADVIL,MOTRIN) 800 MG tablet Take 800 mg by mouth every 8 (eight) hours as needed for headache, mild pain or moderate pain.    Historical Provider, MD  metoprolol succinate (TOPROL XL) 25 MG 24 hr tablet Take 25 mg by mouth. Will start this after completes samples of bystolic [id number]  Historical Provider, MD  montelukast (SINGULAIR) 10 MG tablet Take 10 mg by mouth at bedtime. Reported on 08/22/2015 03/26/15 03/25/16  Historical Provider, MD  Multiple Vitamin (MULTIVITAMIN WITH MINERALS) TABS tablet Take 1 tablet by mouth daily.    Historical Provider, MD  omeprazole (PRILOSEC) 40 MG capsule Take 40 mg by mouth 2 (two) times daily.    Historical Provider, MD  Respiratory Therapy Supplies (FLUTTER) DEVI Use as directed 03/30/15   Tanda Rockers, MD  topiramate (TOPAMAX) 50 MG tablet Take 50 mg by mouth 2 (two) times daily.    Historical Provider, MD   BP 144/90 mmHg  Pulse 94  Temp(Src) 98.5 F (36.9 C) (Oral)  Resp 16  SpO2 96% Physical Exam   Constitutional: She is oriented to person, place, and time. Vital signs are normal. She appears well-developed and well-nourished.  Non-toxic appearance. No distress.  Afebrile, nontoxic, NAD  HENT:  Head: Normocephalic and atraumatic.  Mouth/Throat: Oropharynx is clear and moist and mucous membranes are normal.  Eyes: Conjunctivae and EOM are normal. Right eye exhibits no discharge. Left eye exhibits no discharge.  Neck: Normal range of motion. Neck supple.  Cardiovascular: Normal rate, regular rhythm, normal heart sounds and intact distal pulses.  Exam reveals no gallop and no friction rub.   No murmur heard. RRR, nl s1/s2, no m/r/g, distal pulses intact, no pedal edema   Pulmonary/Chest: Effort normal and breath sounds normal. No respiratory distress. She has no decreased breath sounds. She has no wheezes. She has no rhonchi.  She has no rales.  CTAB in all lung fields, no w/r/r, no hypoxia or increased WOB, speaking in full sentences, SpO2 97-100% on RA   Abdominal: Soft. Normal appearance and bowel sounds are normal. She exhibits no distension. There is tenderness in the right upper quadrant, epigastric area and suprapubic area. There is no rigidity, no rebound, no guarding, no CVA tenderness, no tenderness at McBurney's point and negative Murphy's sign (painful murphy's but able to fully inspire).    Soft, obese which limits exam but overall no obvious distension, +BS throughout, with mild TTP in RUQ, epigastrum, and suprapubic area, no r/g/r, neg murphy's exam although pain during murphy's but able to fully inspire, neg mcburney's point tenderness, no CVA TTP   Genitourinary:  Refused GU/rectal exam  Musculoskeletal: Normal range of motion.  MAE x4 Strength and sensation grossly intact Distal pulses intact Gait steady No pedal edema, neg homan's bilaterally   Neurological: She is alert and oriented to person, place, and time. She has normal strength. No sensory deficit.  Skin: Skin  is warm, dry and intact. No rash noted.  Psychiatric: She has a normal mood and affect.  Nursing note and vitals reviewed.   ED Course  Procedures (including critical care time) Labs Review Labs Reviewed  COMPREHENSIVE METABOLIC PANEL - Abnormal; Notable for the following:    Glucose, Bld 109 (*)    Creatinine, Ser 1.05 (*)    GFR calc non Af Amer 58 (*)    All other components within normal limits  URINALYSIS, ROUTINE W REFLEX MICROSCOPIC (NOT AT [facility]) - Abnormal; Notable for the following:    APPearance CLOUDY (*)    Leukocytes, UA MODERATE (*)    All other components within normal limits  URINE MICROSCOPIC-ADD ON - Abnormal; Notable for the following:    Squamous Epithelial / LPF 0-5 (*)    Bacteria, UA RARE (*)    Casts HYALINE CASTS (*)    Crystals CA OXALATE CRYSTALS (*)    All other components within normal limits  URINE CULTURE  LIPASE, BLOOD  CBC    Imaging Review Ct Abdomen Pelvis W Contrast  08/27/2015  CLINICAL DATA:  Right upper quadrant and suprapubic abdominal tenderness. Nausea. Bloody stool. EXAM: CT ABDOMEN AND PELVIS WITH CONTRAST TECHNIQUE: Multidetector CT imaging of the abdomen and pelvis was performed using the standard protocol following bolus administration of intravenous contrast. CONTRAST:  23mL OMNIPAQUE IOHEXOL 300 MG/ML SOLN, 122mL OMNIPAQUE IOHEXOL 300 MG/ML SOLN COMPARISON:  Most recent CT 02/05/2015 FINDINGS: Lower chest:  The included lung bases are clear. Liver: No focal lesion. Hepatobiliary: Gallbladder physiologically distended, no calcified stone. No biliary dilatation. Pancreas: No ductal dilatation or inflammation. Spleen: Normal. Adrenal glands: No nodule. Kidneys: Mild hydronephrosis without obstructing stone. The degree has mildly increased from previous CT. Mild right renal atrophy is again seen. There is diminished right renal excretion on delayed phase imaging. Small right renal cyst is unchanged. Probable nonobstructing stone in the lower  left kidney, no left hydronephrosis or obstructive uropathy. Stomach/Bowel: Small hiatal hernia. Stomach physiologically distended. There are no dilated or thickened small bowel loops. Again seen inflammatory change about the mid sigmoid colon in the setting of multiple diverticula, a discretely inflamed diverticulum is not seen. Questionable region of asymmetric colonic wall thickening in the sigmoid colon versus nondistention. The appendix is normal. Vascular/Lymphatic: No retroperitoneal adenopathy. Stable small right external iliac node. Abdominal aorta is normal in caliber. Reproductive: Uterus and adnexa normal for age. Bladder: Physiologically distended, no  wall thickening. Other: No free air, free fluid, or intra-abdominal fluid collection. Musculoskeletal: There are no acute or suspicious osseous abnormalities. Degenerative change in the lumbar spine with minimal degenerative-type anterolisthesis of L4 on L5. IMPRESSION: 1. Inflammatory change in the mid sigmoid colon in the setting of multiple diverticula. Acute diverticulitis is considered, though a discrete inflamed diverticulum is not identified. This is similar in location to prior exam. Additionally there is a questionable region of colonic wall thickening in the sigmoid colon. Recommend colonoscopy for direct visualization to exclude underlying colonic neoplasm. 2. Mild right renal atrophy with right hydroureteronephrosis, mildly increased in degree from prior. No obstructing stone. This may reflect waxing and waning of chronic UPJ obstruction or ureteral stricture secondary to previous stone. Electronically Signed   By: Jeb Levering M.D.   On: 08/27/2015 00:57   I have personally reviewed and evaluated these images and lab results as part of my medical decision-making.   EKG Interpretation   Date/Time:  Sunday August 26 2015 17:47:31 EST Ventricular Rate:  81 PR Interval:  153 QRS Duration: 99 QT Interval:  369 QTC Calculation:  428 R Axis:   42 Text Interpretation:  Sinus rhythm No significant change since last  tracing Confirmed by Wilson Singer  MD, STEPHEN ([id number]) on 08/26/2015 11:51:57 PM      MDM   Final diagnoses:  Abdominal pain, unspecified abdominal location  Nausea  Mucous in stools  Hematochezia  Urinary frequency  DOE (dyspnea on exertion)  Diverticulitis of large intestine without perforation or abscess with bleeding    60 y.o. female here with multiple complaints, primarily 3 days of lower abd pain with mucousy stools and BRBPR on toilet paper with wiping. Some nausea, urinary freq/hesitency. States she's had symptoms like this before with both diverticulitis and kidney stones. Also complains of several wks of SOB with exertion, nothing acute today with regards to this. Has a chronic cough as well. On exam, tenderness in RUQ and suprapubic area. Pain during murphys exam but still able to fully inspire. U/A with moderate leuks, 0-5 squamous, 6-30 WBC but rare bacteria. Pt states this doesn't feel like a UTI, and the U/A is not convincing. Will send for culture, if no other source identified then may consider tx for this given the location of her complaints and the urinary freq/hesitancy. Lipase WNL, CMP with baseline Cr 1.05. CBC unremarkable. Pt refusing rectal exam. EKG unremarkable. No CP complaint, no hypoxia or tachycardia, no LE swelling, no RFs for PE, doubt this as an etiology of her SOB, which is more chronic and I feel can be further worked up outpt since this isn't her primary complaint today. Will get CT abd/pelvis to further eval for diverticulitis vs obstruction (given multiple surgeries) vs nephrolithiasis vs biliary etiology vs other source. Will give pain meds and nausea meds, and fluids, then reassess shortly.   12:48 AM Nursing reporting that pt states her pain and nausea have returned after coming back from CT. Will repeat morphine and give phenergan. Awaiting CT results.   1:04 AM CT  resulting with possible acute diverticulitis although this is in the same location as prior findings therefore she needs f/up for colonscopy for further direct visualization. Will treat with moxifloxacin since pt doesn't tolerate flagyl. F/up with her GI specialist in 3-5 days. Also f/up with PCP in 1wk for ongoing management of her SOB with exertion. Pt tolerating PO well here. Rx for Pain meds and nausea meds given. I explained the diagnosis and have  given explicit precautions to return to the ER including for any other new or worsening symptoms. The patient understands and accepts the medical plan as it's been dictated and I have answered their questions. Discharge instructions concerning home care and prescriptions have been given. The patient is STABLE and is discharged to home in good condition.  BP 127/70 mmHg  Pulse 85  Temp(Src) 98.5 F (36.9 C) (Oral)  Resp 18  SpO2 98%  Meds ordered this encounter  Medications  . morphine 4 MG/ML injection 4 mg    Sig:   . sodium chloride 0.9 % bolus 1,000 mL    Sig:   . ondansetron (ZOFRAN) injection 4 mg    Sig:   . iohexol (OMNIPAQUE) 300 MG/ML solution 50 mL    Sig:   . iohexol (OMNIPAQUE) 300 MG/ML solution 100 mL    Sig:   . morphine 4 MG/ML injection 4 mg    Sig:   . promethazine (PHENERGAN) injection 25 mg    Sig:   . HYDROcodone-acetaminophen (NORCO) 5-325 MG tablet    Sig: Take 1 tablet by mouth every 6 (six) hours as needed for severe pain.    Dispense:  6 tablet    Refill:  0    Order Specific Question:  Supervising Provider    Answer:  MILLER, BRIAN [[id number]]  . ondansetron (ZOFRAN ODT) 8 MG disintegrating tablet    Sig: Take 1 tablet (8 mg total) by mouth every 8 (eight) hours as needed for nausea or vomiting.    Dispense:  10 tablet    Refill:  0    Order Specific Question:  Supervising Provider    Answer:  Sabra Heck, BRIAN [[id number]]  . moxifloxacin (AVELOX) 400 MG tablet    Sig: Take 1 tablet (400 mg total) by mouth daily at 8  pm. X 10 days    Dispense:  10 tablet    Refill:  0    Order Specific Question:  Supervising Provider    Answer:  Noemi Chapel [place], PA-C 08/27/15 0106  Virgel Manifold, MD 08/30/15 [phone]

## 2015-08-27 MED ORDER — MORPHINE SULFATE (PF) 4 MG/ML IV SOLN
4.0000 mg | Freq: Once | INTRAVENOUS | Status: AC
  Administered 2015-08-27: 4 mg via INTRAVENOUS
  Filled 2015-08-27: qty 1

## 2015-08-27 MED ORDER — PROMETHAZINE HCL 25 MG/ML IJ SOLN
25.0000 mg | Freq: Once | INTRAMUSCULAR | Status: AC
  Administered 2015-08-27: 25 mg via INTRAVENOUS
  Filled 2015-08-27: qty 1

## 2015-08-27 MED ORDER — IOHEXOL 300 MG/ML  SOLN
100.0000 mL | Freq: Once | INTRAMUSCULAR | Status: AC | PRN
  Administered 2015-08-27: 100 mL via INTRAVENOUS

## 2015-08-27 MED ORDER — HYDROCODONE-ACETAMINOPHEN 5-325 MG PO TABS: 1.0000 | ORAL_TABLET | Freq: Four times a day (QID) | ORAL | Status: DC | PRN

## 2015-08-27 MED ORDER — MOXIFLOXACIN HCL 400 MG PO TABS: 400.0000 mg | ORAL_TABLET | Freq: Every day | ORAL | Status: DC

## 2015-08-27 MED ORDER — ONDANSETRON 8 MG PO TBDP: 8.0000 mg | ORAL_TABLET | Freq: Three times a day (TID) | ORAL | Status: DC | PRN

## 2015-08-27 NOTE — Discharge Instructions (Signed)
Your symptoms seem to be due to diverticulitis. Take antibiotic as directed. Use ibuprofen or norco as directed as needed for pain. Don't drive while taking norco. Use zofran as directed as needed for nausea. Stay well hydrated with plenty of fluids. Follow up with your regular doctor in 3-5 days for recheck of symptoms, and ongoing management of your shortness of breath on exertion. Return to the ER for changes or worsening symptoms.  Abdominal (belly) pain can be caused by many things. Your caregiver performed an examination and possibly ordered blood/urine tests and imaging (CT scan, x-rays, ultrasound). Many cases can be observed and treated at home after initial evaluation in the emergency department. Even though you are being discharged home, abdominal pain can be unpredictable. Therefore, you need a repeated exam if your pain does not resolve, returns, or worsens. Most patients with abdominal pain don't have to be admitted to the hospital or have surgery, but serious problems like appendicitis and gallbladder attacks can start out as nonspecific pain. Many abdominal conditions cannot be diagnosed in one visit, so follow-up evaluations are very important. SEEK IMMEDIATE MEDICAL ATTENTION IF YOU DEVELOP ANY OF THE FOLLOWING SYMPTOMS:  The pain does not go away or becomes severe.   A temperature above 101 develops.   Repeated vomiting occurs (multiple episodes).   The pain becomes localized to portions of the abdomen. The right side could possibly be appendicitis. In an adult, the left lower portion of the abdomen could be colitis or diverticulitis.   Blood is being passed in stools or vomit (bright red or black tarry stools).   Return also if you develop chest pain, difficulty breathing, dizziness or fainting, or become confused, poorly responsive, or inconsolable (young children).  The constipation stays for more than 4 days.   There is belly (abdominal) or rectal pain.   You do not seem  to be getting better.      Diverticulitis Diverticulitis is when small pockets that have formed in your colon (large intestine) become infected or swollen. HOME CARE  Follow your doctor's instructions.  Follow a special diet if told by your doctor.  When you feel better, your doctor may tell you to change your diet. You may be told to eat a lot of fiber. Fruits and vegetables are good sources of fiber. Fiber makes it easier to poop (have bowel movements).  Take supplements or probiotics as told by your doctor.  Only take medicines as told by your doctor.  Keep all follow-up visits with your doctor. GET HELP IF:  Your pain does not get better.  You have a hard time eating food.  You are not pooping like normal. GET HELP RIGHT AWAY IF:  Your pain gets worse.  Your problems do not get better.  Your problems suddenly get worse.  You have a fever.  You keep throwing up (vomiting).  You have bloody or black, tarry poop (stool). MAKE SURE YOU:   Understand these instructions.  Will watch your condition.  Will get help right away if you are not doing well or get worse.   This information is not intended to replace advice given to you by your health care provider. Make sure you discuss any questions you have with your health care provider.   Document Released: 12/10/2007 Document Revised: 06/28/2013 Document Reviewed: 05/18/2013 Elsevier Interactive Patient Education 2016 Elsevier Inc[employer] Abdominal Pain, Adult Many things can cause belly (abdominal) pain. Most times, the belly pain is not dangerous. Many cases of belly  pain can be watched and treated at home. HOME CARE   Do not take medicines that help you go poop (laxatives) unless told to by your doctor.  Only take medicine as told by your doctor.  Eat or drink as told by your doctor. Your doctor will tell you if you should be on a special diet. GET HELP IF:  You do not know what is causing your belly pain.  You  have belly pain while you are sick to your stomach (nauseous) or have runny poop (diarrhea).  You have pain while you pee or poop.  Your belly pain wakes you up at night.  You have belly pain that gets worse or better when you eat.  You have belly pain that gets worse when you eat fatty foods.  You have a fever. GET HELP RIGHT AWAY IF:   The pain does not go away within 2 hours.  You keep throwing up (vomiting).  The pain changes and is only in the right or left part of the belly.  You have bloody or tarry looking poop. MAKE SURE YOU:   Understand these instructions.  Will watch your condition.  Will get help right away if you are not doing well or get worse.   This information is not intended to replace advice given to you by your health care provider. Make sure you discuss any questions you have with your health care provider.   Document Released: 12/10/2007 Document Revised: 07/14/2014 Document Reviewed: 03/02/2013 Elsevier Interactive Patient Education 2016 Elsevier Inc.  Nausea, Adult Nausea is the feeling that you have an upset stomach or have to vomit. Nausea by itself is not likely a serious concern, but it may be an early sign of more serious medical problems. As nausea gets worse, it can lead to vomiting. If vomiting develops, there is the risk of dehydration.  CAUSES   Viral infections.  Food poisoning.  Medicines.  Pregnancy.  Motion sickness.  Migraine headaches.  Emotional distress.  Severe pain from any source.  Alcohol intoxication. HOME CARE INSTRUCTIONS  Get plenty of rest.  Ask your caregiver about specific rehydration instructions.  Eat small amounts of food and sip liquids more often.  Take all medicines as told by your caregiver. SEEK MEDICAL CARE IF:  You have not improved after 2 days, or you get worse.  You have a headache. SEEK IMMEDIATE MEDICAL CARE IF:   You have a fever.  You faint.  You keep vomiting or have  blood in your vomit.  You are extremely weak or dehydrated.  You have dark or bloody stools.  You have severe chest or abdominal pain. MAKE SURE YOU:  Understand these instructions.  Will watch your condition.  Will get help right away if you are not doing well or get worse.   This information is not intended to replace advice given to you by your health care provider. Make sure you discuss any questions you have with your health care provider.   Document Released: 07/31/2004 Document Revised: 07/14/2014 Document Reviewed: 03/05/2011 Elsevier Interactive Patient Education 2016 [employer].  Urinary Frequency The number of times a normal person urinates depends upon how much liquid they take in and how much liquid they are losing. If the temperature is hot and there is high humidity, then the person will sweat more and usually breathe a little more frequently. These factors decrease the amount of frequency of urination that would be considered normal. The amount you drink is easily  determined, but the amount of fluid lost is sometimes more difficult to calculate.  Fluid is lost in two ways:  Sensible fluid loss is usually measured by the amount of urine that you get rid of. Losses of fluid can also occur with diarrhea.  Insensible fluid loss is more difficult to measure. It is caused by evaporation. Insensible loss of fluid occurs through breathing and sweating. It usually ranges from a little less than a quart to a little more than a quart of fluid a day. In normal temperatures and activity levels, the average person may urinate 4 to 7 times in a 24-hour period. Needing to urinate more often than that could indicate a problem. If one urinates 4 to 7 times in 24 hours and has large volumes each time, that could indicate a different problem from one who urinates 4 to 7 times a day and has small volumes. The time of urinating is also important. Most urinating should be done during the  waking hours. Getting up at night to urinate frequently can indicate some problems. CAUSES  The bladder is the organ in your lower abdomen that holds urine. Like a balloon, it swells some as it fills up. Your nerves sense this and tell you it is time to head for the bathroom. There are a number of reasons that you might feel the need to urinate more often than usual. They include:  Urinary tract infection. This is usually associated with other signs such as burning when you urinate.  In men, problems with the prostate (a walnut-size gland that is located near the tube that carries urine out of your body). There are two reasons why the prostate can cause an increased frequency of urination:  An enlarged prostate that does not let the bladder empty well. If the bladder only half empties when you urinate, then it only has half the capacity to fill before you have to urinate again.  The nerves in the bladder become more hypersensitive with an increased size of the prostate even if the bladder empties completely.  Pregnancy.  Obesity. Excess weight is more likely to cause a problem for women than for men.  Bladder stones or other bladder problems.  Caffeine.  Alcohol.  Medications. For example, drugs that help the body get rid of extra fluid (diuretics) increase urine production. Some other medicines must be taken with lots of fluids.  Muscle or nerve weakness. This might be the result of a spinal cord injury, a stroke, multiple sclerosis, or Parkinson disease.  Long-standing diabetes can decrease the sensation of the bladder. This loss of sensation makes it harder to sense the bladder needs to be emptied. Over a period of years, the bladder is stretched out by constant overfilling. This weakens the bladder muscles so that the bladder does not empty well and has less capacity to fill with new urine.  Interstitial cystitis (also called painful bladder syndrome). This condition develops because  the tissues that line the inside of the bladder are inflamed (inflammation is the body's way of reacting to injury or infection). It causes pain and frequent urination. It occurs in women more often than in men. DIAGNOSIS   To decide what might be causing your urinary frequency, your health care provider will probably:  Ask about symptoms you have noticed.  Ask about your overall health. This will include questions about any medications you are taking.  Do a physical examination.  Order some tests. These might include:  A blood test  to check for diabetes or other health issues that could be contributing to the problem.  Urine testing. This could measure the flow of urine and the pressure on the bladder.  A test of your neurological system (the brain, spinal cord, and nerves). This is the system that senses the need to urinate.  A bladder test to check whether it is emptying completely when you urinate.  Cystoscopy. This test uses a thin tube with a tiny camera on it. It offers a look inside your urethra and bladder to see if there are problems.  Imaging tests. You might be given a contrast dye and then asked to urinate. X-rays are taken to see how your bladder is working. TREATMENT  It is important for you to be evaluated to determine if the amount or frequency that you have is unusual or abnormal. If it is found to be abnormal, the cause should be determined and this can usually be found out easily. Depending upon the cause, treatment could include medication, stimulation of the nerves, or surgery. There are not too many things that you can do as an individual to change your urinary frequency. It is important that you balance the amount of fluid intake needed to compensate for your activity and the temperature. Medical problems will be diagnosed and taken care of by your physician. There is no particular bladder training such as Kegel exercises that you can do to help urinary frequency.  This is an exercise that is usually recommended for people who have leaking of urine when they laugh, cough, or sneeze. HOME CARE INSTRUCTIONS   Take any medications your health care provider prescribed or suggested. Follow the directions carefully.  Practice any lifestyle changes that are recommended. These might include:  Drinking less fluid or drinking at different times of the day. If you need to urinate often during the night, for example, you may need to stop drinking fluids early in the evening.  Cutting down on caffeine or alcohol. They both can make you need to urinate more often than normal. Caffeine is found in coffee, tea, and sodas.  Losing weight, if that is recommended.  Keep a journal or a log. You might be asked to record how much you drink and when and where you feel the need to urinate. This will also help evaluate how well the treatment provided by your physician is working. SEEK MEDICAL CARE IF:   Your need to urinate often gets worse.  You feel increased pain or irritation when you urinate.  You notice blood in your urine.  You have questions about any medications that your health care provider recommended.  You notice blood, pus, or swelling at the site of any test or treatment procedure.  You develop a fever of more than 100.72F (38.1C). SEEK IMMEDIATE MEDICAL CARE IF:  You develop a fever of more than 102.77F (38.9C).   This information is not intended to replace advice given to you by your health care provider. Make sure you discuss any questions you have with your health care provider.   Document Released: 04/19/2009 Document Revised: 07/14/2014 Document Reviewed: 04/19/2009 Elsevier Interactive Patient Education [employer].

## 2015-08-27 NOTE — ED Notes (Signed)
Pt transported to CT ?

## 2015-08-28 LAB — URINE CULTURE

## 2015-08-30 ENCOUNTER — Telehealth: Payer: Self-pay | Admitting: *Deleted

## 2015-08-30 NOTE — Telephone Encounter (Signed)
Pt called stating her pharmacy ([facility] on [address] [phone]) needs prior auth for Avelox.  ERCM called pharmacy.  Pharmacy states they never received Rx for Avelox and that pt has taken before, so no prior auth would be needed.  ERCM relayed order to [facility] returned call to pt to inform her that Rx should be ready for pickup within the hour.  Pt very appreciative.

## 2015-08-31 ENCOUNTER — Telehealth: Payer: Self-pay | Admitting: Gastroenterology

## 2015-08-31 DIAGNOSIS — K219 Gastro-esophageal reflux disease without esophagitis: Secondary | ICD-10-CM

## 2015-08-31 MED ORDER — OMEPRAZOLE 40 MG PO CPDR: 40.0000 mg | DELAYED_RELEASE_CAPSULE | Freq: Two times a day (BID) | ORAL | Status: DC

## 2015-08-31 NOTE — Telephone Encounter (Signed)
Called patient back but no answer, left message. She should be on either omeprazole or nexium, not both. Will clarify with her

## 2015-08-31 NOTE — Telephone Encounter (Signed)
Patient states Dr. Havery Moros called her last night with results. She is returning his call. She states she needs refills on Omeprazole and Nexium also. These have been filled by her PCP in past but they do not want to fill now. Please, advise.

## 2015-08-31 NOTE — Telephone Encounter (Signed)
I called the patient with her biopsy results. 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 Repeat biopsies show nondysplastic Barrett's esophagus. I counseled her on this and that there is a risk for malignancy but the risk is extremely low in general and surveillance is recommended every 3-5 years for this type of lesion.   She asked for a refill of omeprazole and nexium. I did not realize she was taking both. Told her to only take one, she wished to take omeprazole. I will refill this for her and cancel nexium.   She otherwise found shortlived benefit from dilation - 3-4 days she felt improvement and then dysphagia recurred. Dilation was done to 63mm. I wonder if this subtle stricture is causing her symptoms or if she also has dysmotility. [name] can you please coordinate a manometry for her to rule out dysmotiltiy. If the manometry is normal will repeat an EGD and dilate to 75mm to see if this provides benefit - I told her this is as large as we can dilate her endoscopically if the stricture is contributing to symptoms  She otherwise reports being diagnosed with diverticulitis a few weeks ago. On flouroquinolone (flagyl allergy) and symptoms improving. She has never had a colonoscopy and is scheduled she thinks for late [facility]Arch which should be okay. If her diverticulitis symptoms aren't improved by then she needs to let us know.   All questions answered I will see her for her follow up.   [name] recall EGD in 3-5 years

## 2015-09-03 ENCOUNTER — Encounter: Payer: Self-pay | Admitting: *Deleted

## 2015-09-03 ENCOUNTER — Other Ambulatory Visit: Payer: Self-pay | Admitting: *Deleted

## 2015-09-03 DIAGNOSIS — R1310 Dysphagia, unspecified: Secondary | ICD-10-CM

## 2015-09-10 ENCOUNTER — Encounter (HOSPITAL_COMMUNITY)
Admission: RE | Disposition: A | Discharge status: Home / Self Care | Payer: Self-pay | Source: Ambulatory Visit | Attending: Gastroenterology

## 2015-09-10 ENCOUNTER — Ambulatory Visit (HOSPITAL_COMMUNITY)
Admission: RE | Admit: 2015-09-10 | Discharge: 2015-09-10 | Disposition: A | Discharge status: Home / Self Care | Payer: Medicaid Other | Source: Ambulatory Visit | Attending: Gastroenterology | Admitting: Gastroenterology

## 2015-09-10 DIAGNOSIS — R1310 Dysphagia, unspecified: Secondary | ICD-10-CM | POA: Diagnosis not present

## 2015-09-10 DIAGNOSIS — K449 Diaphragmatic hernia without obstruction or gangrene: Secondary | ICD-10-CM | POA: Diagnosis not present

## 2015-09-10 HISTORY — PX: ESOPHAGEAL MANOMETRY: SHX5429

## 2015-09-10 SURGERY — MANOMETRY, ESOPHAGUS

## 2015-09-10 MED ORDER — LIDOCAINE VISCOUS 2 % MT SOLN
OROMUCOSAL | Status: AC
  Filled 2015-09-10: qty 15

## 2015-09-10 SURGICAL SUPPLY — 2 items
FACESHIELD LNG OPTICON STERILE (SAFETY) IMPLANT
GLOVE BIO SURGEON STRL SZ8 (GLOVE) ×6 IMPLANT

## 2015-09-12 ENCOUNTER — Encounter (HOSPITAL_COMMUNITY): Payer: Self-pay | Admitting: Gastroenterology

## 2015-09-12 ENCOUNTER — Telehealth: Payer: Self-pay | Admitting: Gastroenterology

## 2015-09-12 NOTE — Telephone Encounter (Signed)
Spoke with patient and told her it can take several weeks for results on manometry.

## 2015-09-19 ENCOUNTER — Telehealth: Payer: Self-pay | Admitting: Gastroenterology

## 2015-09-19 MED ORDER — CIPROFLOXACIN HCL 500 MG PO TABS: ORAL_TABLET | ORAL | Status: DC

## 2015-09-19 NOTE — Telephone Encounter (Signed)
Spoke with patient and she states 2 days ago, she started having diverticulitis symptoms again. States she has blood and pus come out when she has gas. When she wipes, it is slime like. No fever. Pain is in"private parts". The pain is labor like pain. States the only relief is when she puts her feet up on the couch. When she eats, it is worse. She is scheduled for colonoscopy on 09/26/15. Please, advise.

## 2015-09-19 NOTE — Telephone Encounter (Signed)
Ann [name] can you please let this patient know that her manometry did not show any abnormality to account for her dysphagia. She has normal propagation of esophageal contractions, no evidence of achalasia. Normal study per [facility] classification. 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 Can you ask the patient how she is feeling? If symptoms persist I can try another dilation however 58mm is as large as I can go, not sure how much further this will benefit her. If she wishes to speak with me about it please let me know. Thanks

## 2015-09-19 NOTE — Telephone Encounter (Signed)
Results given to patient. She is doing some better and will call back if she decides to have another EGD.

## 2015-09-19 NOTE — Telephone Encounter (Signed)
Spoke with Nicoletta Ba, PA [phone] to Flagyl. She recommendations Cipro 500 mg BID x 14 days. Called patient with recommendations. Cancelled procedure on 09/26/15. Rx sent. Patient will call to reschedule colonoscopy.

## 2015-09-19 NOTE — Telephone Encounter (Signed)
Given her prior CT findings suggesting diverticulitis and her symptoms of recurrence, I would treat her again with Cipro and flagyl if she is not doing well, for 2 weeks. She needs a colonoscopy to clarify and confirm this is indeed diverticulitis, however it may be better to reschedule her colonoscopy in light of these symptoms to be done in April. Can you see how she wishes to proceed. Thanks.

## 2015-09-26 ENCOUNTER — Encounter: Payer: Medicaid Other | Admitting: Gastroenterology

## 2015-10-01 ENCOUNTER — Emergency Department (HOSPITAL_COMMUNITY): Payer: Medicaid Other

## 2015-10-01 ENCOUNTER — Telehealth: Payer: Self-pay | Admitting: Gastroenterology

## 2015-10-01 ENCOUNTER — Telehealth: Payer: Self-pay | Admitting: *Deleted

## 2015-10-01 ENCOUNTER — Encounter: Payer: Self-pay | Admitting: *Deleted

## 2015-10-01 ENCOUNTER — Encounter (HOSPITAL_COMMUNITY): Payer: Self-pay | Admitting: Emergency Medicine

## 2015-10-01 ENCOUNTER — Emergency Department (HOSPITAL_COMMUNITY)
Admission: EM | Admit: 2015-10-01 | Discharge: 2015-10-02 | Disposition: A | Discharge status: Home / Self Care | Payer: Medicaid Other | Attending: Emergency Medicine | Admitting: Emergency Medicine

## 2015-10-01 DIAGNOSIS — Z8614 Personal history of Methicillin resistant Staphylococcus aureus infection: Secondary | ICD-10-CM | POA: Diagnosis not present

## 2015-10-01 DIAGNOSIS — F419 Anxiety disorder, unspecified: Secondary | ICD-10-CM | POA: Insufficient documentation

## 2015-10-01 DIAGNOSIS — Z3202 Encounter for pregnancy test, result negative: Secondary | ICD-10-CM | POA: Insufficient documentation

## 2015-10-01 DIAGNOSIS — Z8639 Personal history of other endocrine, nutritional and metabolic disease: Secondary | ICD-10-CM | POA: Diagnosis not present

## 2015-10-01 DIAGNOSIS — Z87440 Personal history of urinary (tract) infections: Secondary | ICD-10-CM | POA: Diagnosis not present

## 2015-10-01 DIAGNOSIS — I129 Hypertensive chronic kidney disease with stage 1 through stage 4 chronic kidney disease, or unspecified chronic kidney disease: Secondary | ICD-10-CM | POA: Diagnosis not present

## 2015-10-01 DIAGNOSIS — N183 Chronic kidney disease, stage 3 (moderate): Secondary | ICD-10-CM | POA: Diagnosis not present

## 2015-10-01 DIAGNOSIS — K5732 Diverticulitis of large intestine without perforation or abscess without bleeding: Secondary | ICD-10-CM | POA: Insufficient documentation

## 2015-10-01 DIAGNOSIS — Z8673 Personal history of transient ischemic attack (TIA), and cerebral infarction without residual deficits: Secondary | ICD-10-CM | POA: Diagnosis not present

## 2015-10-01 DIAGNOSIS — Z79899 Other long term (current) drug therapy: Secondary | ICD-10-CM | POA: Diagnosis not present

## 2015-10-01 DIAGNOSIS — Z7982 Long term (current) use of aspirin: Secondary | ICD-10-CM | POA: Diagnosis not present

## 2015-10-01 DIAGNOSIS — Z7951 Long term (current) use of inhaled steroids: Secondary | ICD-10-CM | POA: Diagnosis not present

## 2015-10-01 DIAGNOSIS — Z9981 Dependence on supplemental oxygen: Secondary | ICD-10-CM | POA: Insufficient documentation

## 2015-10-01 DIAGNOSIS — G4733 Obstructive sleep apnea (adult) (pediatric): Secondary | ICD-10-CM | POA: Diagnosis not present

## 2015-10-01 DIAGNOSIS — R102 Pelvic and perineal pain: Secondary | ICD-10-CM | POA: Diagnosis present

## 2015-10-01 DIAGNOSIS — M1990 Unspecified osteoarthritis, unspecified site: Secondary | ICD-10-CM | POA: Diagnosis not present

## 2015-10-01 DIAGNOSIS — R109 Unspecified abdominal pain: Secondary | ICD-10-CM

## 2015-10-01 DIAGNOSIS — Z9851 Tubal ligation status: Secondary | ICD-10-CM | POA: Diagnosis not present

## 2015-10-01 DIAGNOSIS — K219 Gastro-esophageal reflux disease without esophagitis: Secondary | ICD-10-CM | POA: Insufficient documentation

## 2015-10-01 LAB — URINALYSIS, ROUTINE W REFLEX MICROSCOPIC
Bilirubin Urine: NEGATIVE
Glucose, UA: NEGATIVE mg/dL
Hgb urine dipstick: NEGATIVE
Ketones, ur: NEGATIVE mg/dL
Leukocytes, UA: NEGATIVE
NITRITE: NEGATIVE
PH: 7 (ref 5.0–8.0)
Protein, ur: NEGATIVE mg/dL
SPECIFIC GRAVITY, URINE: 1.017 (ref 1.005–1.030)

## 2015-10-01 MED ORDER — IOHEXOL 300 MG/ML  SOLN: 25.0000 mL | Freq: Once | INTRAMUSCULAR | Status: DC | PRN

## 2015-10-01 MED ORDER — IOPAMIDOL (ISOVUE-300) INJECTION 61%
100.0000 mL | Freq: Once | INTRAVENOUS | Status: AC | PRN
  Administered 2015-10-02: 100 mL via INTRAVENOUS

## 2015-10-01 MED ORDER — ONDANSETRON 4 MG PO TBDP
4.0000 mg | ORAL_TABLET | Freq: Once | ORAL | Status: AC | PRN
  Administered 2015-10-01: 4 mg via ORAL
  Filled 2015-10-01: qty 1

## 2015-10-01 MED ORDER — MORPHINE SULFATE (PF) 2 MG/ML IV SOLN
2.0000 mg | Freq: Once | INTRAVENOUS | Status: AC
  Administered 2015-10-01: 2 mg via INTRAVENOUS
  Filled 2015-10-01: qty 1

## 2015-10-01 MED ORDER — PROMETHAZINE HCL 25 MG PO TABS
12.5000 mg | ORAL_TABLET | Freq: Once | ORAL | Status: AC
  Administered 2015-10-01: 12.5 mg via ORAL
  Filled 2015-10-01: qty 1

## 2015-10-01 NOTE — Telephone Encounter (Signed)
Spoke with [facility] and scheduled CT tomorrow at 9:00 AM. No solid food, only liquids. Contrast at 7:15 and 8:15 AM. Patient aware. Contrast and instructions up front for pick up.

## 2015-10-01 NOTE — Telephone Encounter (Signed)
Patient called back and cancelled CT. She is going to ED.

## 2015-10-01 NOTE — Telephone Encounter (Signed)
Spoke with patient and she states she completed the Cipro and thought she was better. A few days ago, she started having lower abdominal pain in middle of abdomen. Today it is worse, just like when she had diverticulitis. She is taking Miralax daily and has small stools. She does not feel she is emptying well. Denies fever or vomiting. She states the pain is intense and she is considering going to ED. Please, advise.

## 2015-10-01 NOTE — ED Notes (Signed)
Bed: WTR7 Expected date:  Expected time:  Means of arrival:  Comments: Phlebotomy 

## 2015-10-01 NOTE — Telephone Encounter (Signed)
Patient has been treated for diverticulitis previously with interval recurrence and worsening symptoms. Her previous colonoscopy was post-poned due to concern for active diverticulitis. She called in with severe abdominal pain, offered her imaging but if she cannot wait she will have to go to the ER for further evaluation and pain medication

## 2015-10-01 NOTE — ED Notes (Signed)
Patient presents for lower abdominal pain x2-3 days, bilateral flank pain, dark mucus and blood in rectum with flatulence, N/V. History of diverticulitis. States this feels similar. Scheduled for colonoscopy 3/15, unable to complete r/t diverticulitis flare up. Cipro in early March. Rates pain 8/10.

## 2015-10-01 NOTE — ED Notes (Signed)
Pt. asked for urine specimen but unable to void.

## 2015-10-01 NOTE — Telephone Encounter (Signed)
Spoke with Dr. Havery Moros and patient needs to have a CT this week or if pain is severe needs to go to ED today. Spoke with patient and she wants to set up a CT this week. Left a message for [facility] to call back.

## 2015-10-01 NOTE — ED Provider Notes (Signed)
CSN: [id number]     Arrival date & time 10/01/15  1922 History   First MD Initiated Contact with Patient 10/01/15 2134     Chief Complaint  Patient presents with  . Abdominal Pain  . Diverticulitis    HPI   60 year old female presents today with complaints of pelvic pain. Patient reports this identical presentation to previous episodes of diverticulitis. Patient reports she's had several episodes of this, most recently on February 19. She was seen in the ED, discharged home with antibiotics, she reports that symptoms improved. She notes that she was scheduled for a colonoscopy on March 15, one week prior to that she started developing pain again, he called her gastroenterologist Dr. Havery Moros who placed her on 14 days of Cipro. She reports that she has been off for approximately one week, was feeling better, but had return of symptoms approximately 3 days ago. She reports the pain is pressure-like down in her pelvis, describes it as " giving birth" with the addition of bilateral back pain. Patient denies any vaginal discharge or bleeding, but notes that when she passes gas she has small amount of blood and mucus. She reports the symptoms are identical to previous episodes of diverticulitis. Patient notes a significant past history of uterine prolapse, post pessary placement. She reports this had failed. Patient denies any fever or chills, nausea or vomiting, upper abdominal pain. She reports she's been able to tolerate by mouth without difficulty.     Past Medical History  Diagnosis Date  . Hypertension   . Arthritis   . GERD (gastroesophageal reflux disease)   . Hyperlipidemia   . History of recurrent UTIs   . H/O hiatal hernia   . History of esophageal dilatation   . History of TIAs NO RESIDUAL    1980;  2005;   2008 PT STATES PER CT  SCARRING RIGHT SIDE OF BRAIN  . Diverticulosis of colon   . SUI (stress urinary incontinence, female)   . Chronic cystitis   . OSA on CPAP     PER  SLEEP STUDY 09-08-2012  MODERATE OSA  . Borderline diabetes   . Nocturia   . Anxiety   . Neuropathy ([place])     pt denies this 07-05-2015  . Headache(784.0)     severe headaches   . Complication of anesthesia     02-20-2014 ([facility]) INTRA-OP RESPIRATORY FAILURE SECONDARY TO POSSIBLE MUCOUS ASPIRATION/  ALSO 06-06-2013 [facility]) POST-OP DESATURATION  EVEN USING CPAP  . Family history of adverse reaction to anesthesia     PER PT SISTER DIED AFTER GENERAL ANESTHESIA DUE TO UNDIAGNOSED OSA  . History of MRSA infection     3/ 2015  AXILLARY ABSCESS  . Neuromuscular disorder ([place])     HH  . Sleep apnea   . Allergy     seasonal  . Stroke ([place])     TIA'S  . Kidney disease     III   Past Surgical History  Procedure Laterality Date  . Knee arthroscopy Bilateral 2008  . Esophageal dilation  X2  LAST ONE  JUNE 2014  . Tubal ligation  1987  . Anterior and posterior repair N/A 06/06/2013    Procedure: Anterior vaginal vault repair, Sacrospinous ligament fixation with UPHOLD lite, Kelly plication, Sacrospinous mesh fixation, Solyx transurethral sling;  Surgeon: Ailene Rud, MD;  Location: [facility];  Service: Urology;  Laterality: N/A;  . Rectocele repair N/A 02/20/2014    Procedure: POSTERIOR REPAIR (RECTOCELE) WITH VAGINAL  VAULT REPAIR WITH ACELL GRAFT PLACEMENT, PERINEOPLASTY, ENTEROCELE REPAIR;  Surgeon: Ailene Rud, MD;  Location: [facility];  Service: Urology;  Laterality: N/A;  . Cystoscopy w/ ureteral stent placement Right 04/21/2014    Procedure: CYSTOSCOPY WITH RETROGRADE PYELOGRAM/URETERAL STENT PLACEMENT;  Surgeon: Ailene Rud, MD;  Location: [facility];  Service: Urology;  Laterality: Right;  . Cystoscopy with retrograde pyelogram, ureteroscopy and stent placement Right 06/05/2014    Procedure: CYSTOSCOPY WITH RIGHT RETROGRADE PYELOGRAM, RIGHT URETEROSCOPY, STONE EXTRACTION AND RIGHT DOUBLE J STENT PLACEMENT;  Surgeon: Ailene Rud, MD;  Location:  [facility];  Service: Urology;  Laterality: Right;  . Holmium laser application Right [id number]    Procedure: HOLMIUM LASER OF STONE ;  Surgeon: Ailene Rud, MD;  Location: [facility];  Service: Urology;  Laterality: Right;  . Cystoscopy w/ ureteral stent placement Right 11/21/2014    Procedure: CYSTOSCOPY WITH RETROGRADE PYELOGRAM, URETEROSCOPY WITH STONE REMOVAL, URETERAL STENT PLACEMENT;  Surgeon: Carolan Clines, MD;  Location: [facility];  Service: Urology;  Laterality: Right;  . Esophageal manometry N/A 09/10/2015    Procedure: ESOPHAGEAL MANOMETRY (EM);  Surgeon: Manus Gunning, MD;  Location: [facility];  Service: Gastroenterology;  Laterality: N/A;   Family History  Problem Relation Age of Onset  . Colon cancer Father 7    died at 24  . Esophageal cancer Neg Hx   . Rectal cancer Neg Hx   . Stomach cancer Neg Hx   . Colon polyps Neg Hx   . Heart disease Mother    Social History  Substance Use Topics  . Smoking status: Never Smoker   . Smokeless tobacco: Never Used  . Alcohol Use: No   OB History    No data available     Review of Systems  All other systems reviewed and are negative.  Allergies  Ace inhibitors and Metronidazole  Home Medications   Prior to Admission medications   Medication Sig Start Date End Date Taking? Authorizing Provider  albuterol (PROAIR HFA) 108 (90 Base) MCG/ACT inhaler Inhale 2 puffs into the lungs every 4 (four) hours as needed. For cough wheezing. 08/07/15  Yes Historical Provider, MD  albuterol (PROVENTIL) (2.5 MG/3ML) 0.083% nebulizer solution Inhale 3 mLs into the lungs every 6 (six) hours as needed. Wheezing/shortness of breath 08/07/15  Yes Historical Provider, MD  amLODipine (NORVASC) 10 MG tablet Take 10 mg by mouth 2 (two) times daily.   Yes Historical Provider, MD  aspirin EC 81 MG tablet Take 81 mg by mouth daily.   Yes Historical Provider, MD  chlorpheniramine (CHLOR-TRIMETON) 4 MG  tablet Take 1 tablet (4 mg total) by mouth at bedtime. 03/08/15  Yes Chesley Mires, MD  Cholecalciferol (VITAMIN D PO) Take 1 tablet by mouth daily.   Yes Historical Provider, MD  clonazePAM (KLONOPIN) 0.5 MG tablet Take 0.5 mg by mouth 3 (three) times daily as needed for anxiety.   Yes Historical Provider, MD  fluticasone (FLONASE) 50 MCG/ACT nasal spray Place 2 sprays into both nostrils daily. Patient taking differently: Place 2 sprays into both nostrils daily as needed for allergies.  03/08/15  Yes Chesley Mires, MD  furosemide (LASIX) 40 MG tablet Take 40 mg by mouth daily as needed for fluid.  03/06/15  Yes Historical Provider, MD  ibuprofen (ADVIL,MOTRIN) 800 MG tablet Take 800 mg by mouth every 8 (eight) hours as needed for headache, mild pain or moderate pain.   Yes Historical Provider, MD  metoprolol succinate (  TOPROL-XL) 50 MG 24 hr tablet Take 50 mg by mouth daily. 08/08/15  Yes Historical Provider, MD  Multiple Vitamin (MULTIVITAMIN WITH MINERALS) TABS tablet Take 1 tablet by mouth daily.   Yes Historical Provider, MD  omeprazole (PRILOSEC) 40 MG capsule Take 1 capsule (40 mg total) by mouth 2 (two) times daily. 08/31/15  Yes Manus Gunning, MD  Respiratory Therapy Supplies (FLUTTER) DEVI Use as directed 03/30/15  Yes Tanda Rockers, MD  topiramate (TOPAMAX) 50 MG tablet Take 50 mg by mouth 2 (two) times daily.   Yes Historical Provider, MD  ciprofloxacin (CIPRO) 500 MG tablet Take one po BID x 14 days Patient not taking: Reported on 10/01/2015 09/19/15   Manus Gunning, MD  famotidine (PEPCID) 20 MG tablet One at bedtime Patient not taking: Reported on 08/26/2015 03/30/15   Tanda Rockers, MD  HYDROcodone-acetaminophen (NORCO) 5-325 MG tablet Take 1 tablet by mouth every 6 (six) hours as needed for severe pain. Patient not taking: Reported on 10/01/2015 08/27/15   Mercedes Camprubi-Soms, PA-C  moxifloxacin (AVELOX) 400 MG tablet Take 1 tablet (400 mg total) by mouth daily at 8 pm. X 10  days Patient not taking: Reported on 10/01/2015 08/27/15   Mercedes Camprubi-Soms, PA-C  ondansetron (ZOFRAN ODT) 8 MG disintegrating tablet Take 1 tablet (8 mg total) by mouth every 8 (eight) hours as needed for nausea or vomiting. Patient not taking: Reported on 10/01/2015 08/27/15   Mercedes Camprubi-Soms, PA-C   BP 124/75 mmHg  Pulse 72  Temp(Src) 98.2 F (36.8 C) (Oral)  Resp 16  SpO2 93%   Physical Exam  Constitutional: She is oriented to person, place, and time. She appears well-developed and well-nourished.  HENT:  Head: Normocephalic and atraumatic.  Eyes: Pupils are equal, round, and reactive to light.  Neck: Normal range of motion. Neck supple. No JVD present. No tracheal deviation present. No thyromegaly present.  Cardiovascular: Normal rate, regular rhythm, normal heart sounds and intact distal pulses.  Exam reveals no gallop and no friction rub.   No murmur heard. Pulmonary/Chest: Effort normal and breath sounds normal. No stridor. No respiratory distress. She has no wheezes. She has no rales. She exhibits no tenderness.  Abdominal:  TTP of bilateral lower quadrants and pelvis   Musculoskeletal: Normal range of motion.  Lymphadenopathy:    She has no cervical adenopathy.  Neurological: She is alert and oriented to person, place, and time. Coordination normal.  Skin: Skin is warm and dry.  Psychiatric: She has a normal mood and affect. Her behavior is normal. Judgment and thought content normal.  Nursing note and vitals reviewed.   ED Course  Procedures (including critical care time) Labs Review Labs Reviewed  LIPASE, BLOOD  COMPREHENSIVE METABOLIC PANEL  CBC  URINALYSIS, ROUTINE W REFLEX MICROSCOPIC (NOT AT [facility])  I-STAT BETA HCG BLOOD, ED ([facility], [facility], [facility] ONLY)    Imaging Review No results found. I have personally reviewed and evaluated these images and lab results as part of my medical decision-making.   EKG Interpretation None      MDM   Final diagnoses:   Diverticulitis of sigmoid colon    Labs: I-STAT beta-hCG, lipase, CMP, CBC, urinalysis-   Imaging: CT abdomen and pelvis with contrast  Consults:  Therapeutics:  Discharge Meds:   Assessment/Plan:60 year old female presents today with abdominal pain complaints of diverticulitis. Patient reports this is identical to previous episodes, she is afebrile, nontoxic. She has appeared to be somewhat distressed due to pain, she will  be given pain medication here CT scan for further evaluation. Initial laboratory analysis showed no significant findings of severe infection. Patient's care will be transferred to oncoming provider pending CT and disposition.        Okey Regal, PA-C 10/03/15 0135  Lacretia Leigh, MD 10/04/15 [phone]

## 2015-10-01 NOTE — ED Notes (Signed)
Nurse starting IV, drawing labs 

## 2015-10-02 ENCOUNTER — Encounter (HOSPITAL_COMMUNITY): Payer: Self-pay

## 2015-10-02 ENCOUNTER — Inpatient Hospital Stay: Admission: RE | Admit: 2015-10-02 | Payer: Medicaid Other | Source: Ambulatory Visit

## 2015-10-02 LAB — COMPREHENSIVE METABOLIC PANEL
ALBUMIN: 3.5 g/dL (ref 3.5–5.0)
ALT: 17 U/L (ref 14–54)
AST: 19 U/L (ref 15–41)
Alkaline Phosphatase: 72 U/L (ref 38–126)
Anion gap: 7 (ref 5–15)
BUN: 15 mg/dL (ref 6–20)
CHLORIDE: 105 mmol/L (ref 101–111)
CO2: 27 mmol/L (ref 22–32)
CREATININE: 1 mg/dL (ref 0.44–1.00)
Calcium: 9 mg/dL (ref 8.9–10.3)
GFR calc non Af Amer: >60 mL/min (ref >60)
GLUCOSE: 120 mg/dL — AB (ref 65–99)
Potassium: 4.7 mmol/L (ref 3.5–5.1)
SODIUM: 139 mmol/L (ref 135–145)
Total Bilirubin: 0.6 mg/dL (ref 0.3–1.2)
Total Protein: 7.4 g/dL (ref 6.5–8.1)

## 2015-10-02 LAB — CBC
HEMATOCRIT: 41 % (ref 36.0–46.0)
HEMOGLOBIN: 13.3 g/dL (ref 12.0–15.0)
MCH: 31.7 pg (ref 26.0–34.0)
MCHC: 32.4 g/dL (ref 30.0–36.0)
MCV: 97.6 fL (ref 78.0–100.0)
Platelets: 369 10*3/uL (ref 150–400)
RBC: 4.2 MIL/uL (ref 3.87–5.11)
RDW: 13.6 % (ref 11.5–15.5)
WBC: 8.3 10*3/uL (ref 4.0–10.5)

## 2015-10-02 LAB — I-STAT BETA HCG BLOOD, ED (MC, WL, AP ONLY): I-stat hCG, quantitative: <5 m[IU]/mL (ref <5)

## 2015-10-02 LAB — LIPASE, BLOOD: Lipase: 26 U/L (ref 11–51)

## 2015-10-02 MED ORDER — MORPHINE SULFATE (PF) 4 MG/ML IV SOLN
3.0000 mg | Freq: Once | INTRAVENOUS | Status: AC
  Administered 2015-10-02: 3 mg via INTRAVENOUS
  Filled 2015-10-02: qty 1

## 2015-10-02 MED ORDER — CIPROFLOXACIN HCL 500 MG PO TABS: 500.0000 mg | ORAL_TABLET | Freq: Two times a day (BID) | ORAL | Status: DC

## 2015-10-02 MED ORDER — CIPROFLOXACIN HCL 500 MG PO TABS
500.0000 mg | ORAL_TABLET | Freq: Once | ORAL | Status: AC
  Administered 2015-10-02: 500 mg via ORAL
  Filled 2015-10-02: qty 1

## 2015-10-02 NOTE — Discharge Instructions (Signed)
Your CT scan shows that you do have my diverticular disease without any abscess or inflammation.  Your white count is normal.  You've again been placed on Cipro to control your symptoms please make an appointment with your gastroenterologist for follow-up   Diverticulitis Diverticulitis is when small pockets that have formed in your colon (large intestine) become infected or swollen. HOME CARE  Follow your doctor's instructions.  Follow a special diet if told by your doctor.  When you feel better, your doctor may tell you to change your diet. You may be told to eat a lot of fiber. Fruits and vegetables are good sources of fiber. Fiber makes it easier to poop (have bowel movements).  Take supplements or probiotics as told by your doctor.  Only take medicines as told by your doctor.  Keep all follow-up visits with your doctor. GET HELP IF:  Your pain does not get better.  You have a hard time eating food.  You are not pooping like normal. GET HELP RIGHT AWAY IF:  Your pain gets worse.  Your problems do not get better.  Your problems suddenly get worse.  You have a fever.  You keep throwing up (vomiting).  You have bloody or black, tarry poop (stool). MAKE SURE YOU:   Understand these instructions.  Will watch your condition.  Will get help right away if you are not doing well or get worse.   This information is not intended to replace advice given to you by your health care provider. Make sure you discuss any questions you have with your health care provider.   Document Released: 12/10/2007 Document Revised: 06/28/2013 Document Reviewed: 05/18/2013 Elsevier Interactive Patient Education 2016 Elsevier Inc.  High-Fiber Diet Fiber, also called dietary fiber, is a type of carbohydrate found in fruits, vegetables, whole grains, and beans. A high-fiber diet can have many health benefits. Your health care provider may recommend a high-fiber diet to help:  Prevent  constipation. Fiber can make your bowel movements more regular.  Lower your cholesterol.  Relieve hemorrhoids, uncomplicated diverticulosis, or irritable bowel syndrome.  Prevent overeating as part of a weight-loss plan.  Prevent heart disease, type 2 diabetes, and certain cancers. WHAT IS MY PLAN? The recommended daily intake of fiber includes:  38 grams for men under age 78.  17 grams for men over age 49.  4 grams for women under age 2.  32 grams for women over age 57. You can get the recommended daily intake of dietary fiber by eating a variety of fruits, vegetables, grains, and beans. Your health care provider may also recommend a fiber supplement if it is not possible to get enough fiber through your diet. WHAT DO I NEED TO KNOW ABOUT A HIGH-FIBER DIET?  Fiber supplements have not been widely studied for their effectiveness, so it is better to get fiber through food sources.  Always check the fiber content on thenutrition facts label of any prepackaged food. Look for foods that contain at least 5 grams of fiber per serving.  Ask your dietitian if you have questions about specific foods that are related to your condition, especially if those foods are not listed in the following section.  Increase your daily fiber consumption gradually. Increasing your intake of dietary fiber too quickly may cause bloating, cramping, or gas.  Drink plenty of water. Water helps you to digest fiber. WHAT FOODS CAN I EAT? Grains Whole-grain breads. Multigrain cereal. Oats and oatmeal. Brown rice. Barley. Bulgur wheat. [place]. Bran muffins.  Popcorn. Rye wafer crackers. Vegetables Sweet potatoes. Spinach. Kale. Artichokes. Cabbage. Broccoli. Green peas. Carrots. Squash. Fruits Berries. Pears. Apples. Oranges. Avocados. Prunes and raisins. Dried figs. Meats and Other Protein Sources Navy, kidney, pinto, and soy beans. Split peas. Lentils. Nuts and seeds. Dairy Fiber-fortified  yogurt. Beverages Fiber-fortified soy milk. Fiber-fortified orange juice. Other Fiber bars. The items listed above may not be a complete list of recommended foods or beverages. Contact your dietitian for more options. WHAT FOODS ARE NOT RECOMMENDED? Grains White bread. Pasta made with refined flour. White rice. Vegetables Fried potatoes. Canned vegetables. Well-cooked vegetables.  Fruits Fruit juice. Cooked, strained fruit. Meats and Other Protein Sources Fatty cuts of meat. Fried Sales executive or fried fish. Dairy Milk. Yogurt. Cream cheese. Sour cream. Beverages Soft drinks. Other Cakes and pastries. Butter and oils. The items listed above may not be a complete list of foods and beverages to avoid. Contact your dietitian for more information. WHAT ARE SOME TIPS FOR INCLUDING HIGH-FIBER FOODS IN MY DIET?  Eat a wide variety of high-fiber foods.  Make sure that half of all grains consumed each day are whole grains.  Replace breads and cereals made from refined flour or white flour with whole-grain breads and cereals.  Replace white rice with brown rice, bulgur wheat, or millet.  Start the day with a breakfast that is high in fiber, such as a cereal that contains at least 5 grams of fiber per serving.  Use beans in place of meat in soups, salads, or pasta.  Eat high-fiber snacks, such as berries, raw vegetables, nuts, or popcorn.   This information is not intended to replace advice given to you by your health care provider. Make sure you discuss any questions you have with your health care provider.   Document Released: 06/23/2005 Document Revised: 07/14/2014 Document Reviewed: 12/06/2013 Elsevier Interactive Patient Education [employer].

## 2015-11-19 ENCOUNTER — Emergency Department (HOSPITAL_COMMUNITY)
Admission: EM | Admit: 2015-11-19 | Discharge: 2015-11-20 | Disposition: A | Discharge status: Home / Self Care | Payer: Medicaid Other | Attending: Emergency Medicine | Admitting: Emergency Medicine

## 2015-11-19 ENCOUNTER — Encounter (HOSPITAL_COMMUNITY): Payer: Self-pay

## 2015-11-19 ENCOUNTER — Emergency Department (HOSPITAL_COMMUNITY): Payer: Medicaid Other

## 2015-11-19 DIAGNOSIS — G4733 Obstructive sleep apnea (adult) (pediatric): Secondary | ICD-10-CM | POA: Insufficient documentation

## 2015-11-19 DIAGNOSIS — Z7982 Long term (current) use of aspirin: Secondary | ICD-10-CM | POA: Diagnosis not present

## 2015-11-19 DIAGNOSIS — Z8673 Personal history of transient ischemic attack (TIA), and cerebral infarction without residual deficits: Secondary | ICD-10-CM | POA: Insufficient documentation

## 2015-11-19 DIAGNOSIS — Z792 Long term (current) use of antibiotics: Secondary | ICD-10-CM | POA: Insufficient documentation

## 2015-11-19 DIAGNOSIS — N23 Unspecified renal colic: Secondary | ICD-10-CM | POA: Insufficient documentation

## 2015-11-19 DIAGNOSIS — I10 Essential (primary) hypertension: Secondary | ICD-10-CM | POA: Diagnosis not present

## 2015-11-19 DIAGNOSIS — K5792 Diverticulitis of intestine, part unspecified, without perforation or abscess without bleeding: Secondary | ICD-10-CM | POA: Diagnosis present

## 2015-11-19 DIAGNOSIS — M1990 Unspecified osteoarthritis, unspecified site: Secondary | ICD-10-CM | POA: Diagnosis not present

## 2015-11-19 DIAGNOSIS — Z79891 Long term (current) use of opiate analgesic: Secondary | ICD-10-CM | POA: Diagnosis not present

## 2015-11-19 DIAGNOSIS — Z791 Long term (current) use of non-steroidal anti-inflammatories (NSAID): Secondary | ICD-10-CM | POA: Diagnosis not present

## 2015-11-19 DIAGNOSIS — E785 Hyperlipidemia, unspecified: Secondary | ICD-10-CM | POA: Diagnosis not present

## 2015-11-19 LAB — CBC WITH DIFFERENTIAL/PLATELET
Basophils Absolute: 0 10*3/uL (ref 0.0–0.1)
Basophils Relative: 0 %
Eosinophils Absolute: 0 10*3/uL (ref 0.0–0.7)
Eosinophils Relative: 1 %
HCT: 41.3 % (ref 36.0–46.0)
Hemoglobin: 13.7 g/dL (ref 12.0–15.0)
Lymphocytes Relative: 20 %
Lymphs Abs: 1.7 10*3/uL (ref 0.7–4.0)
MCH: 31.6 pg (ref 26.0–34.0)
MCHC: 33.2 g/dL (ref 30.0–36.0)
MCV: 95.4 fL (ref 78.0–100.0)
Monocytes Absolute: 0.5 10*3/uL (ref 0.1–1.0)
Monocytes Relative: 6 %
Neutro Abs: 6.3 10*3/uL (ref 1.7–7.7)
Neutrophils Relative %: 73 %
Platelets: 316 10*3/uL (ref 150–400)
RBC: 4.33 MIL/uL (ref 3.87–5.11)
RDW: 13.9 % (ref 11.5–15.5)
WBC: 8.6 10*3/uL (ref 4.0–10.5)

## 2015-11-19 LAB — URINALYSIS, ROUTINE W REFLEX MICROSCOPIC
Bilirubin Urine: NEGATIVE
Glucose, UA: NEGATIVE mg/dL
Ketones, ur: NEGATIVE mg/dL
Nitrite: NEGATIVE
Protein, ur: NEGATIVE mg/dL
Specific Gravity, Urine: 1.015 (ref 1.005–1.030)
pH: 6 (ref 5.0–8.0)

## 2015-11-19 LAB — BASIC METABOLIC PANEL
Anion gap: 8 (ref 5–15)
BUN: 21 mg/dL — ABNORMAL HIGH (ref 6–20)
CO2: 28 mmol/L (ref 22–32)
Calcium: 9.2 mg/dL (ref 8.9–10.3)
Chloride: 106 mmol/L (ref 101–111)
Creatinine, Ser: 1.26 mg/dL — ABNORMAL HIGH (ref 0.44–1.00)
GFR calc Af Amer: 54 mL/min — ABNORMAL LOW (ref >60)
GFR calc non Af Amer: 46 mL/min — ABNORMAL LOW (ref >60)
Glucose, Bld: 126 mg/dL — ABNORMAL HIGH (ref 65–99)
Potassium: 4.6 mmol/L (ref 3.5–5.1)
Sodium: 142 mmol/L (ref 135–145)

## 2015-11-19 LAB — URINE MICROSCOPIC-ADD ON

## 2015-11-19 MED ORDER — OXYCODONE-ACETAMINOPHEN 5-325 MG PO TABS: 1.0000 | ORAL_TABLET | ORAL | Status: DC | PRN

## 2015-11-19 MED ORDER — IOPAMIDOL (ISOVUE-300) INJECTION 61%
80.0000 mL | Freq: Once | INTRAVENOUS | Status: AC | PRN
  Administered 2015-11-19: 75 mL via INTRAVENOUS

## 2015-11-19 MED ORDER — KETOROLAC TROMETHAMINE 15 MG/ML IJ SOLN
15.0000 mg | Freq: Once | INTRAMUSCULAR | Status: AC
  Administered 2015-11-19: 15 mg via INTRAVENOUS
  Filled 2015-11-19: qty 1

## 2015-11-19 MED ORDER — ONDANSETRON HCL 4 MG PO TABS: 4.0000 mg | ORAL_TABLET | Freq: Four times a day (QID) | ORAL | Status: DC

## 2015-11-19 MED ORDER — DIATRIZOATE MEGLUMINE & SODIUM 66-10 % PO SOLN
15.0000 mL | Freq: Once | ORAL | Status: AC
  Administered 2015-11-19: 30 mL via ORAL

## 2015-11-19 MED ORDER — SODIUM CHLORIDE 0.9 % IV BOLUS (SEPSIS)
1000.0000 mL | Freq: Once | INTRAVENOUS | Status: AC
  Administered 2015-11-19: 1000 mL via INTRAVENOUS

## 2015-11-19 MED ORDER — HYDROMORPHONE HCL 1 MG/ML IJ SOLN
1.0000 mg | Freq: Once | INTRAMUSCULAR | Status: AC
  Administered 2015-11-19: 1 mg via INTRAVENOUS
  Filled 2015-11-19: qty 1

## 2015-11-19 MED ORDER — PROCHLORPERAZINE EDISYLATE 5 MG/ML IJ SOLN
10.0000 mg | Freq: Once | INTRAMUSCULAR | Status: AC
  Administered 2015-11-19: 10 mg via INTRAVENOUS
  Filled 2015-11-19: qty 2

## 2015-11-19 MED ORDER — ONDANSETRON HCL 4 MG/2ML IJ SOLN
4.0000 mg | Freq: Once | INTRAMUSCULAR | Status: AC
  Administered 2015-11-19: 4 mg via INTRAVENOUS
  Filled 2015-11-19: qty 2

## 2015-11-19 NOTE — ED Provider Notes (Signed)
CSN: [id number]     Arrival date & time 11/19/15  1941 History   First MD Initiated Contact with Patient 11/19/15 2052     Chief Complaint  Patient presents with  . Diverticulitis     (Consider location/radiation/quality/duration/timing/severity/associated sxs/prior Treatment) HPI   60 year old female with abdominal pain. Gradual onset about 3 days ago. She has a past history of recurrent diverticulitis. She felt like her symptoms may be secondary to this. Her pain has been progressing since onset. He went to see her PCP prior to arrival. She was given a dose of Rocephin referred to the emergency room for further evaluation. She reports multiple bouts of diverticulitis since January. She was last on antibiotics in March. No fevers or chills. Nausea. Noticed some bright red blood in her stool yesterday. None in BM today. She additionally has a past history of kidney stones. Urine has been rust colored. No dysuria.    Past Medical History  Diagnosis Date  . Hypertension   . Arthritis   . GERD (gastroesophageal reflux disease)   . Hyperlipidemia   . History of recurrent UTIs   . H/O hiatal hernia   . History of esophageal dilatation   . History of TIAs NO RESIDUAL    1980;  2005;   2008 PT STATES PER CT  SCARRING RIGHT SIDE OF BRAIN  . Diverticulosis of colon   . SUI (stress urinary incontinence, female)   . Chronic cystitis   . OSA on CPAP     PER SLEEP STUDY 09-08-2012  MODERATE OSA  . Borderline diabetes   . Nocturia   . Anxiety   . Neuropathy ([place])     pt denies this 07-05-2015  . Headache(784.0)     severe headaches   . Complication of anesthesia     02-20-2014 ([facility]) INTRA-OP RESPIRATORY FAILURE SECONDARY TO POSSIBLE MUCOUS ASPIRATION/  ALSO 06-06-2013 [facility]) POST-OP DESATURATION  EVEN USING CPAP  . Family history of adverse reaction to anesthesia     PER PT SISTER DIED AFTER GENERAL ANESTHESIA DUE TO UNDIAGNOSED OSA  . History of MRSA infection     3/ 2015  AXILLARY  ABSCESS  . Neuromuscular disorder ([place])     HH  . Sleep apnea   . Allergy     seasonal  . Stroke ([place])     TIA'S  . Kidney disease     III   Past Surgical History  Procedure Laterality Date  . Knee arthroscopy Bilateral 2008  . Esophageal dilation  X2  LAST ONE  JUNE 2014  . Tubal ligation  1987  . Anterior and posterior repair N/A 06/06/2013    Procedure: Anterior vaginal vault repair, Sacrospinous ligament fixation with UPHOLD lite, Kelly plication, Sacrospinous mesh fixation, Solyx transurethral sling;  Surgeon: Ailene Rud, MD;  Location: [facility];  Service: Urology;  Laterality: N/A;  . Rectocele repair N/A 02/20/2014    Procedure: POSTERIOR REPAIR (RECTOCELE) WITH VAGINAL VAULT REPAIR WITH ACELL GRAFT PLACEMENT, PERINEOPLASTY, ENTEROCELE REPAIR;  Surgeon: Ailene Rud, MD;  Location: [facility];  Service: Urology;  Laterality: N/A;  . Cystoscopy w/ ureteral stent placement Right 04/21/2014    Procedure: CYSTOSCOPY WITH RETROGRADE PYELOGRAM/URETERAL STENT PLACEMENT;  Surgeon: Ailene Rud, MD;  Location: [facility];  Service: Urology;  Laterality: Right;  . Cystoscopy with retrograde pyelogram, ureteroscopy and stent placement Right 06/05/2014    Procedure: CYSTOSCOPY WITH RIGHT RETROGRADE PYELOGRAM, RIGHT URETEROSCOPY, STONE EXTRACTION AND RIGHT DOUBLE J STENT PLACEMENT;  Surgeon:  Ailene Rud, MD;  Location: [facility];  Service: Urology;  Laterality: Right;  . Holmium laser application Right [id number]    Procedure: HOLMIUM LASER OF STONE ;  Surgeon: Ailene Rud, MD;  Location: [facility];  Service: Urology;  Laterality: Right;  . Cystoscopy w/ ureteral stent placement Right 11/21/2014    Procedure: CYSTOSCOPY WITH RETROGRADE PYELOGRAM, URETEROSCOPY WITH STONE REMOVAL, URETERAL STENT PLACEMENT;  Surgeon: Carolan Clines, MD;  Location: [facility];  Service: Urology;  Laterality: Right;  . Esophageal  manometry N/A 09/10/2015    Procedure: ESOPHAGEAL MANOMETRY (EM);  Surgeon: Manus Gunning, MD;  Location: [facility];  Service: Gastroenterology;  Laterality: N/A;   Family History  Problem Relation Age of Onset  . Colon cancer Father 24    died at 89  . Esophageal cancer Neg Hx   . Rectal cancer Neg Hx   . Stomach cancer Neg Hx   . Colon polyps Neg Hx   . Heart disease Mother    Social History  Substance Use Topics  . Smoking status: Never Smoker   . Smokeless tobacco: Never Used  . Alcohol Use: No   OB History    No data available     Review of Systems  All systems reviewed and negative, other than as noted in HPI.   Allergies  Ace inhibitors and Metronidazole  Home Medications   Prior to Admission medications   Medication Sig Start Date End Date Taking? Authorizing Provider  albuterol (PROAIR HFA) 108 (90 Base) MCG/ACT inhaler Inhale 2 puffs into the lungs every 4 (four) hours as needed. For cough wheezing. 08/07/15  Yes Historical Provider, MD  albuterol (PROVENTIL) (2.5 MG/3ML) 0.083% nebulizer solution Inhale 3 mLs into the lungs every 6 (six) hours as needed. Wheezing/shortness of breath 08/07/15  Yes Historical Provider, MD  amLODipine (NORVASC) 10 MG tablet Take 10 mg by mouth daily.    Yes Historical Provider, MD  aspirin EC 81 MG tablet Take 81 mg by mouth daily.   Yes Historical Provider, MD  CefTRIAXone Sodium (ROCEPHIN IJ) Inject as directed once.   Yes Historical Provider, MD  chlorpheniramine (CHLOR-TRIMETON) 4 MG tablet Take 1 tablet (4 mg total) by mouth at bedtime. 03/08/15  Yes Chesley Mires, MD  Cholecalciferol (VITAMIN D PO) Take 1 tablet by mouth daily.   Yes Historical Provider, MD  clonazePAM (KLONOPIN) 0.5 MG tablet Take 0.5 mg by mouth 3 (three) times daily as needed for anxiety.   Yes Historical Provider, MD  fluticasone (FLONASE) 50 MCG/ACT nasal spray Place 2 sprays into both nostrils daily. Patient taking differently: Place 2 sprays into  both nostrils daily as needed for allergies.  03/08/15  Yes Chesley Mires, MD  furosemide (LASIX) 40 MG tablet Take 40 mg by mouth daily as needed for fluid.  03/06/15  Yes Historical Provider, MD  ibuprofen (ADVIL,MOTRIN) 800 MG tablet Take 800 mg by mouth every 8 (eight) hours as needed for headache, mild pain or moderate pain.   Yes Historical Provider, MD  metoprolol succinate (TOPROL-XL) 50 MG 24 hr tablet Take 50 mg by mouth daily. 08/08/15  Yes Historical Provider, MD  Multiple Vitamin (MULTIVITAMIN WITH MINERALS) TABS tablet Take 1 tablet by mouth daily.   Yes Historical Provider, MD  omeprazole (PRILOSEC) 40 MG capsule Take 1 capsule (40 mg total) by mouth 2 (two) times daily. 08/31/15  Yes Manus Gunning, MD  Respiratory Therapy Supplies (FLUTTER) DEVI Use as directed 03/30/15  Yes  Tanda Rockers, MD  topiramate (TOPAMAX) 50 MG tablet Take 50 mg by mouth 2 (two) times daily.   Yes Historical Provider, MD  amoxicillin-clavulanate (AUGMENTIN) 875-125 MG tablet Take 1 tablet by mouth 2 (two) times daily. Reported on 11/19/2015 11/19/15 11/29/15  Historical Provider, MD  ciprofloxacin (CIPRO) 500 MG tablet Take 1 tablet (500 mg total) by mouth 2 (two) times daily. Patient not taking: Reported on 11/19/2015 10/02/15   Junius Creamer, NP  famotidine (PEPCID) 20 MG tablet One at bedtime Patient not taking: Reported on 08/26/2015 03/30/15   Tanda Rockers, MD  HYDROcodone-acetaminophen (NORCO) 5-325 MG tablet Take 1 tablet by mouth every 6 (six) hours as needed for severe pain. Patient not taking: Reported on 10/01/2015 08/27/15   Mercedes Camprubi-Soms, PA-C  moxifloxacin (AVELOX) 400 MG tablet Take 1 tablet (400 mg total) by mouth daily at 8 pm. X 10 days Patient not taking: Reported on 10/01/2015 08/27/15   Mercedes Camprubi-Soms, PA-C  ondansetron (ZOFRAN ODT) 8 MG disintegrating tablet Take 1 tablet (8 mg total) by mouth every 8 (eight) hours as needed for nausea or vomiting. Patient not taking: Reported  on 10/01/2015 08/27/15   Mercedes Camprubi-Soms, PA-C   BP 133/90 mmHg  Pulse 96  Temp(Src) 98.6 F (37 C) (Oral)  Resp 17  SpO2 94% Physical Exam  Constitutional: She appears well-developed and well-nourished. No distress.  HENT:  Head: Normocephalic and atraumatic.  Eyes: Conjunctivae are normal. Right eye exhibits no discharge. Left eye exhibits no discharge.  Neck: Neck supple.  Cardiovascular: Normal rate, regular rhythm and normal heart sounds.  Exam reveals no gallop and no friction rub.   No murmur heard. Pulmonary/Chest: Effort normal and breath sounds normal. No respiratory distress.  Abdominal: Soft. She exhibits no distension. There is tenderness.  LLQ tenderness w/o guarding/rebound  Musculoskeletal: She exhibits no edema or tenderness.  Neurological: She is alert.  Skin: Skin is warm and dry.  Psychiatric: She has a normal mood and affect. Her behavior is normal. Thought content normal.  Nursing note and vitals reviewed.   ED Course  Procedures (including critical care time) Labs Review Labs Reviewed  URINALYSIS, ROUTINE W REFLEX MICROSCOPIC (NOT AT [facility]) - Abnormal; Notable for the following:    Color, Urine AMBER (*)    APPearance CLOUDY (*)    Hgb urine dipstick LARGE (*)    Leukocytes, UA SMALL (*)    All other components within normal limits  BASIC METABOLIC PANEL - Abnormal; Notable for the following:    Glucose, Bld 126 (*)    BUN 21 (*)    Creatinine, Ser 1.26 (*)    GFR calc non Af Amer 46 (*)    GFR calc Af Amer 54 (*)    All other components within normal limits  URINE MICROSCOPIC-ADD ON - Abnormal; Notable for the following:    Squamous Epithelial / LPF 0-5 (*)    Bacteria, UA RARE (*)    All other components within normal limits  CBC WITH DIFFERENTIAL/PLATELET    Imaging Review No results found. I have personally reviewed and evaluated these images and lab results as part of my medical decision-making.   EKG Interpretation None       MDM   Final diagnoses:  Ureteral colic    60 year old female with abdominal pain. Possible diverticulitis given her history. Per my review, it appears that her pain is actually from left ureteral stone. Patient was discharged in the care of Dr. Roxanne Mins in signout with  radiology read of CT pending. Disposition pending symptomatic control and addressing any additional CT findings that radiology my note.    Virgel Manifold, MD 11/28/15 [phone]

## 2015-11-19 NOTE — ED Notes (Signed)
Pt complains of abdominal pain for three days, hx of diverticulitis and the pain feels the same

## 2015-11-19 NOTE — ED Notes (Signed)
RN starting IV, drawing labs 

## 2015-11-19 NOTE — Discharge Instructions (Signed)
Kidney Stones °Kidney stones (urolithiasis) are deposits that form inside your kidneys. The intense pain is caused by the stone moving through the urinary tract. When the stone moves, the ureter goes into spasm around the stone. The stone is usually passed in the urine.  °CAUSES  °· A disorder that makes certain neck glands produce too much parathyroid hormone (primary hyperparathyroidism). °· A buildup of uric acid crystals, similar to gout in your joints. °· Narrowing (stricture) of the ureter. °· A kidney obstruction present at birth (congenital obstruction). °· Previous surgery on the kidney or ureters. °· Numerous kidney infections. °SYMPTOMS  °· Feeling sick to your stomach (nauseous). °· Throwing up (vomiting). °· Blood in the urine (hematuria). °· Pain that usually spreads (radiates) to the groin. °· Frequency or urgency of urination. °DIAGNOSIS  °· Taking a history and physical exam. °· Blood or urine tests. °· CT scan. °· Occasionally, an examination of the inside of the urinary bladder (cystoscopy) is performed. °TREATMENT  °· Observation. °· Increasing your fluid intake. °· Extracorporeal shock wave lithotripsy--This is a noninvasive procedure that uses shock waves to break up kidney stones. °· Surgery may be needed if you have severe pain or persistent obstruction. There are various surgical procedures. Most of the procedures are performed with the use of small instruments. Only small incisions are needed to accommodate these instruments, so recovery time is minimized. °The size, location, and chemical composition are all important variables that will determine the proper choice of action for you. Talk to your health care provider to better understand your situation so that you will minimize the risk of injury to yourself and your kidney.  °HOME CARE INSTRUCTIONS  °· Drink enough water and fluids to keep your urine clear or pale yellow. This will help you to pass the stone or stone fragments. °· Strain  all urine through the provided strainer. Keep all particulate matter and stones for your health care provider to see. The stone causing the pain may be as small as a grain of salt. It is very important to use the strainer each and every time you pass your urine. The collection of your stone will allow your health care provider to analyze it and verify that a stone has actually passed. The stone analysis will often identify what you can do to reduce the incidence of recurrences. °· Only take over-the-counter or prescription medicines for pain, discomfort, or fever as directed by your health care provider. °· Keep all follow-up visits as told by your health care provider. This is important. °· Get follow-up X-rays if required. The absence of pain does not always mean that the stone has passed. It may have only stopped moving. If the urine remains completely obstructed, it can cause loss of kidney function or even complete destruction of the kidney. It is your responsibility to make sure X-rays and follow-ups are completed. Ultrasounds of the kidney can show blockages and the status of the kidney. Ultrasounds are not associated with any radiation and can be performed easily in a matter of minutes. °· Make changes to your daily diet as told by your health care provider. You may be told to: °¨ Limit the amount of salt that you eat. °¨ Eat 5 or more servings of fruits and vegetables each day. °¨ Limit the amount of meat, poultry, fish, and eggs that you eat. °· Collect a 24-hour urine sample as told by your health care provider. You may need to collect another urine sample every 6-12   months. °SEEK MEDICAL CARE IF: °· You experience pain that is progressive and unresponsive to any pain medicine you have been prescribed. °SEEK IMMEDIATE MEDICAL CARE IF:  °· Pain cannot be controlled with the prescribed medicine. °· You have a fever or shaking chills. °· The severity or intensity of pain increases over 18 hours and is not  relieved by pain medicine. °· You develop a new onset of abdominal pain. °· You feel faint or pass out. °· You are unable to urinate. °  °This information is not intended to replace advice given to you by your health care provider. Make sure you discuss any questions you have with your health care provider. °  °Document Released: 06/23/2005 Document Revised: 03/14/2015 Document Reviewed: 11/24/2012 °Elsevier Interactive Patient Education ©2016 [employer] ° °

## 2015-11-19 NOTE — ED Notes (Signed)
Pt in CT.

## 2015-11-20 ENCOUNTER — Encounter: Payer: Self-pay | Admitting: Nurse Practitioner

## 2015-11-20 MED ORDER — DIPHENHYDRAMINE HCL 50 MG/ML IJ SOLN
25.0000 mg | Freq: Once | INTRAMUSCULAR | Status: AC
  Administered 2015-11-20: 25 mg via INTRAVENOUS
  Filled 2015-11-20: qty 1

## 2015-11-20 MED ORDER — METOCLOPRAMIDE HCL 5 MG/ML IJ SOLN
10.0000 mg | Freq: Once | INTRAMUSCULAR | Status: AC
  Administered 2015-11-20: 10 mg via INTRAVENOUS
  Filled 2015-11-20: qty 2

## 2015-11-20 NOTE — ED Notes (Signed)
Provider in room  

## 2015-11-20 NOTE — ED Provider Notes (Signed)
Ann [name] seen and evaluated by Dr. Wilson Singer, evaluated for possible diverticulitis. CT shows 2 left ureteral calculi which appear to be the cause of her symptoms. She had received hydromorphone for pain and ondansetron for nausea but had persistent nausea and was given prochlorperazine. She started residual nausea when I reevaluated her. She was given metoclopramide with excellent relief of her nausea. She is discharged with instructions to follow-up with her urologist.  Results for orders placed or performed during the hospital encounter of 11/19/15  Urinalysis, Routine w reflex microscopic (not at [facility])  Result Value Ref Range   Color, Urine AMBER (A) YELLOW   APPearance CLOUDY (A) CLEAR   Specific Gravity, Urine 1.015 1.005 - 1.030   pH 6.0 5.0 - 8.0   Glucose, UA NEGATIVE NEGATIVE mg/dL   Hgb urine dipstick LARGE (A) NEGATIVE   Bilirubin Urine NEGATIVE NEGATIVE   Ketones, ur NEGATIVE NEGATIVE mg/dL   Protein, ur NEGATIVE NEGATIVE mg/dL   Nitrite NEGATIVE NEGATIVE   Leukocytes, UA SMALL (A) NEGATIVE  CBC with Differential  Result Value Ref Range   WBC 8.6 4.0 - 10.5 K/uL   RBC 4.33 3.87 - 5.11 MIL/uL   Hemoglobin 13.7 12.0 - 15.0 g/dL   HCT 41.3 36.0 - 46.0 %   MCV 95.4 78.0 - 100.0 fL   MCH 31.6 26.0 - 34.0 pg   MCHC 33.2 30.0 - 36.0 g/dL   RDW 13.9 11.5 - 15.5 %   Platelets 316 150 - 400 K/uL   Neutrophils Relative % 73 %   Neutro Abs 6.3 1.7 - 7.7 K/uL   Lymphocytes Relative 20 %   Lymphs Abs 1.7 0.7 - 4.0 K/uL   Monocytes Relative 6 %   Monocytes Absolute 0.5 0.1 - 1.0 K/uL   Eosinophils Relative 1 %   Eosinophils Absolute 0.0 0.0 - 0.7 K/uL   Basophils Relative 0 %   Basophils Absolute 0.0 0.0 - 0.1 K/uL  Basic metabolic panel  Result Value Ref Range   Sodium 142 135 - 145 mmol/L   Potassium 4.6 3.5 - 5.1 mmol/L   Chloride 106 101 - 111 mmol/L   CO2 28 22 - 32 mmol/L   Glucose, Bld 126 (H) 65 - 99 mg/dL   BUN 21 (H) 6 - 20 mg/dL   Creatinine, Ser 1.26 (H) 0.44 - 1.00  mg/dL   Calcium 9.2 8.9 - 10.3 mg/dL   GFR calc non Af Amer 46 (L) >60 mL/min   GFR calc Af Amer 54 (L) >60 mL/min   Anion gap 8 5 - 15  Urine microscopic-add on  Result Value Ref Range   Squamous Epithelial / LPF 0-5 (A) NONE SEEN   WBC, UA 0-5 0 - 5 WBC/hpf   RBC / HPF TOO NUMEROUS TO COUNT 0 - 5 RBC/hpf   Bacteria, UA RARE (A) NONE SEEN   Ct Abdomen Pelvis W Contrast  11/19/2015  CLINICAL DATA:  60 year old female with abdominal pain. EXAM: CT ABDOMEN AND PELVIS WITH CONTRAST TECHNIQUE: Multidetector CT imaging of the abdomen and pelvis was performed using the standard protocol following bolus administration of intravenous contrast. CONTRAST:  3mL ISOVUE-300 IOPAMIDOL (ISOVUE-300) INJECTION 61% COMPARISON:  CT dated 10/02/2015 FINDINGS: The visualized lung bases are clear. No intra-abdominal free air or free fluid. The liver, gallbladder, pancreas, spleen, and the adrenal glands appear unremarkable. The reason nonobstructing 4 mm left renal inferior pole calculus. There are two adjacent stones in the mid left ureter measuring 4 mm in axial dimension.  There is partial obstructing of the ureter and mild left hydronephrosis. There is delayed excretion of the contrast by the left kidney. There is stable mild moderate right renal atrophy with mild caliectasis similar to prior study. Stable small right renal hypodense lesion is not characterized. It is The right ureter, urinary bladder appear unremarkable. The uterus and ovaries are grossly unremarkable. There is sigmoid diverticulosis. There is segmental thickening of the sigmoid colon, likely related to underdistention. There is mild perisigmoid stranding which may be chronic and related to prior inflammation/scarring. An early or mild diverticulitis is not excluded. Clinical correlation is recommended. There is no evidence of bowel obstruction. Normal appendix. The abdominal aorta and IVC appear unremarkable. No portal venous gas identified. There is  no adenopathy. There is a midline vertical anterior abdominal wall incisional scar. There is degenerative changes of the spine. No acute fracture. IMPRESSION: Partially obstructing left mid ureteral calculi measuring 4 mm in axial diameter with mild left hydronephrosis. Sigmoid diverticulosis with probable chronic inflammation/scarring. An early or mild acute diverticulitis is less likely but not excluded. Clinical correlation recommended. Electronically Signed   By: Anner Crete M.D.   On: 11/19/2015 23:48    Images viewed by me.   Delora Fuel, MD [id number]

## 2016-01-03 ENCOUNTER — Other Ambulatory Visit: Payer: Self-pay | Admitting: Urology

## 2016-01-28 ENCOUNTER — Encounter (HOSPITAL_COMMUNITY)
Admission: RE | Admit: 2016-01-28 | Discharge: 2016-01-28 | Disposition: A | Discharge status: Home / Self Care | Payer: Medicaid Other | Source: Ambulatory Visit | Attending: Urology | Admitting: Urology

## 2016-01-28 ENCOUNTER — Encounter (HOSPITAL_COMMUNITY): Payer: Self-pay

## 2016-01-28 DIAGNOSIS — Z01818 Encounter for other preprocedural examination: Secondary | ICD-10-CM | POA: Insufficient documentation

## 2016-01-28 HISTORY — DX: Personal history of other specified conditions: Z87.898

## 2016-01-28 HISTORY — DX: Personal history of other diseases of the respiratory system: Z87.09

## 2016-01-28 LAB — CBC
HEMATOCRIT: 41.4 % (ref 36.0–46.0)
HEMOGLOBIN: 13.3 g/dL (ref 12.0–15.0)
MCH: 31.7 pg (ref 26.0–34.0)
MCHC: 32.1 g/dL (ref 30.0–36.0)
MCV: 98.6 fL (ref 78.0–100.0)
Platelets: 336 10*3/uL (ref 150–400)
RBC: 4.2 MIL/uL (ref 3.87–5.11)
RDW: 14.4 % (ref 11.5–15.5)
WBC: 6.2 10*3/uL (ref 4.0–10.5)

## 2016-01-28 LAB — BASIC METABOLIC PANEL
ANION GAP: 5 (ref 5–15)
BUN: 17 mg/dL (ref 6–20)
CALCIUM: 9.3 mg/dL (ref 8.9–10.3)
CO2: 28 mmol/L (ref 22–32)
Chloride: 108 mmol/L (ref 101–111)
Creatinine, Ser: 1.38 mg/dL — ABNORMAL HIGH (ref 0.44–1.00)
GFR, EST AFRICAN AMERICAN: 48 mL/min — AB (ref >60)
GFR, EST NON AFRICAN AMERICAN: 42 mL/min — AB (ref >60)
Glucose, Bld: 94 mg/dL (ref 65–99)
POTASSIUM: 4.9 mmol/L (ref 3.5–5.1)
SODIUM: 141 mmol/L (ref 135–145)

## 2016-01-28 LAB — SURGICAL PCR SCREEN
MRSA, PCR: NEGATIVE
STAPHYLOCOCCUS AUREUS: NEGATIVE

## 2016-01-28 NOTE — Pre-Procedure Instructions (Signed)
EKG 08-26-15, CXR 8'16, CT abd 11-19-15 Epic.

## 2016-01-28 NOTE — Patient Instructions (Addendum)
Ann [name]  01/28/2016   Your procedure is scheduled on: 02-01-16  Report to [facility] Main  Entrance take [facility]  elevators to 3rd floor to  [place] at   0900 AM.  Call this number if you have problems the morning of surgery [phone]   Remember: ONLY 1 PERSON MAY GO WITH YOU TO SHORT STAY TO GET  READY MORNING OF [place].  Do not eat food or drink liquids :After Midnight.     Take these medicines the morning of surgery with A SIP OF WATER: Amlodipine. Omeprazole. Topiramate. Use Inhalers.Bring cpap mask/tubing. DO NOT TAKE ANY DIABETIC MEDICATIONS DAY OF YOUR SURGERY                               You may not have any metal on your body including hair pins and              piercings  Do not wear jewelry, make-up, lotions, powders or perfumes, deodorant             Do not wear nail polish.  Do not shave  48 hours prior to surgery.              Men may shave face and neck.   Do not bring valuables to the hospital. [place].  Contacts, dentures or bridgework may not be worn into surgery.  Leave suitcase in the car. After surgery it may be brought to your room.     Patients discharged the day of surgery will not be allowed to drive home.  Name and phone number of your driver:Robert-spouse  [phone] pt cell  Special Instructions: N/A              Please read over the following fact sheets you were given: _____________________________________________________________________             [facility] - Preparing for Surgery Before surgery, you can play an important role.  Because skin is not sterile, your skin needs to be as free of germs as possible.  You can reduce the number of germs on your skin by washing with CHG (chlorahexidine gluconate) soap before surgery.  CHG is an antiseptic cleaner which kills germs and bonds with the skin to continue killing germs even after washing. Please  DO NOT use if you have an allergy to CHG or antibacterial soaps.  If your skin becomes reddened/irritated stop using the CHG and inform your nurse when you arrive at Short Stay. Do not shave (including legs and underarms) for at least 48 hours prior to the first CHG shower.  You may shave your face/neck. Please follow these instructions carefully:  1.  Shower with CHG Soap the night before surgery and the  morning of Surgery.  2.  If you choose to wash your hair, wash your hair first as usual with your  normal  shampoo.  3.  After you shampoo, rinse your hair and body thoroughly to remove the  shampoo.                           4.  Use CHG as you would any other liquid soap.  You can apply chg directly  to the skin and wash                       Gently with a scrungie or clean washcloth.  5.  Apply the CHG Soap to your body ONLY FROM THE NECK DOWN.   Do not use on face/ open                           Wound or open sores. Avoid contact with eyes, ears mouth and genitals (private parts).                       Wash face,  Genitals (private parts) with your normal soap.             6.  Wash thoroughly, paying special attention to the area where your surgery  will be performed.  7.  Thoroughly rinse your body with warm water from the neck down.  8.  DO NOT shower/wash with your normal soap after using and rinsing off  the CHG Soap.                9.  Pat yourself dry with a clean towel.            10.  Wear clean pajamas.            11.  Place clean sheets on your bed the night of your first shower and do not  sleep with pets. Day of Surgery : Do not apply any lotions/deodorants the morning of surgery.  Please wear clean clothes to the hospital/surgery center.  FAILURE TO FOLLOW THESE INSTRUCTIONS MAY RESULT IN THE CANCELLATION OF YOUR SURGERY PATIENT SIGNATURE_________________________________  NURSE  SIGNATURE__________________________________  ________________________________________________________________________

## 2016-01-28 NOTE — Progress Notes (Signed)
labs viewable in Epic note Creatinine.

## 2016-02-01 ENCOUNTER — Ambulatory Visit (HOSPITAL_COMMUNITY)
Admission: RE | Admit: 2016-02-01 | Discharge: 2016-02-01 | Disposition: A | Discharge status: Home / Self Care | Payer: Medicaid Other | Source: Ambulatory Visit | Attending: Urology | Admitting: Urology

## 2016-02-01 ENCOUNTER — Encounter (HOSPITAL_COMMUNITY): Payer: Self-pay | Admitting: *Deleted

## 2016-02-01 ENCOUNTER — Ambulatory Visit (HOSPITAL_COMMUNITY): Payer: Medicaid Other | Admitting: Anesthesiology

## 2016-02-01 ENCOUNTER — Encounter (HOSPITAL_COMMUNITY)
Admission: RE | Disposition: A | Discharge status: Home / Self Care | Payer: Self-pay | Source: Ambulatory Visit | Attending: Urology

## 2016-02-01 DIAGNOSIS — Z79899 Other long term (current) drug therapy: Secondary | ICD-10-CM | POA: Insufficient documentation

## 2016-02-01 DIAGNOSIS — G4733 Obstructive sleep apnea (adult) (pediatric): Secondary | ICD-10-CM | POA: Insufficient documentation

## 2016-02-01 DIAGNOSIS — Z87442 Personal history of urinary calculi: Secondary | ICD-10-CM | POA: Insufficient documentation

## 2016-02-01 DIAGNOSIS — Z8673 Personal history of transient ischemic attack (TIA), and cerebral infarction without residual deficits: Secondary | ICD-10-CM | POA: Insufficient documentation

## 2016-02-01 DIAGNOSIS — I10 Essential (primary) hypertension: Secondary | ICD-10-CM | POA: Diagnosis not present

## 2016-02-01 DIAGNOSIS — N132 Hydronephrosis with renal and ureteral calculous obstruction: Secondary | ICD-10-CM

## 2016-02-01 DIAGNOSIS — K219 Gastro-esophageal reflux disease without esophagitis: Secondary | ICD-10-CM | POA: Insufficient documentation

## 2016-02-01 DIAGNOSIS — F419 Anxiety disorder, unspecified: Secondary | ICD-10-CM | POA: Diagnosis not present

## 2016-02-01 DIAGNOSIS — N201 Calculus of ureter: Secondary | ICD-10-CM | POA: Insufficient documentation

## 2016-02-01 DIAGNOSIS — J45909 Unspecified asthma, uncomplicated: Secondary | ICD-10-CM | POA: Diagnosis not present

## 2016-02-01 DIAGNOSIS — Z7982 Long term (current) use of aspirin: Secondary | ICD-10-CM | POA: Insufficient documentation

## 2016-02-01 HISTORY — PX: CYSTOSCOPY W/ URETERAL STENT PLACEMENT: SHX1429

## 2016-02-01 SURGERY — CYSTOSCOPY, WITH RETROGRADE PYELOGRAM AND URETERAL STENT INSERTION
Anesthesia: General | Site: Ureter

## 2016-02-01 MED ORDER — ONDANSETRON HCL 4 MG/2ML IJ SOLN
INTRAMUSCULAR | Status: DC | PRN
  Administered 2016-02-01: 4 mg via INTRAVENOUS

## 2016-02-01 MED ORDER — TRAMADOL-ACETAMINOPHEN 37.5-325 MG PO TABS: 1.0000 | ORAL_TABLET | Freq: Four times a day (QID) | ORAL | 2 refills | Status: DC | PRN

## 2016-02-01 MED ORDER — PROMETHAZINE HCL 25 MG/ML IJ SOLN
6.2500 mg | INTRAMUSCULAR | Status: DC | PRN
  Administered 2016-02-01: 6.25 mg via INTRAVENOUS

## 2016-02-01 MED ORDER — ALBUTEROL SULFATE HFA 108 (90 BASE) MCG/ACT IN AERS
INHALATION_SPRAY | RESPIRATORY_TRACT | Status: DC | PRN
  Administered 2016-02-01: 4 via RESPIRATORY_TRACT

## 2016-02-01 MED ORDER — LACTATED RINGERS IV SOLN
INTRAVENOUS | Status: DC
Start: 2016-02-01 — End: 2016-02-01
  Administered 2016-02-01 (×2): via INTRAVENOUS

## 2016-02-01 MED ORDER — SUCCINYLCHOLINE CHLORIDE 20 MG/ML IJ SOLN
INTRAMUSCULAR | Status: DC | PRN
  Administered 2016-02-01: 100 mg via INTRAVENOUS

## 2016-02-01 MED ORDER — CEFAZOLIN SODIUM-DEXTROSE 2-4 GM/100ML-% IV SOLN
2.0000 g | INTRAVENOUS | Status: AC
  Administered 2016-02-01: 2 g via INTRAVENOUS
  Filled 2016-02-01: qty 100

## 2016-02-01 MED ORDER — FENTANYL CITRATE (PF) 100 MCG/2ML IJ SOLN
INTRAMUSCULAR | Status: AC
  Filled 2016-02-01: qty 2

## 2016-02-01 MED ORDER — ROCURONIUM BROMIDE 100 MG/10ML IV SOLN
INTRAVENOUS | Status: DC | PRN
  Administered 2016-02-01: 40 mg via INTRAVENOUS

## 2016-02-01 MED ORDER — PHENAZOPYRIDINE HCL 200 MG PO TABS: 200.0000 mg | ORAL_TABLET | Freq: Three times a day (TID) | ORAL | 1 refills | Status: DC | PRN

## 2016-02-01 MED ORDER — PROPOFOL 10 MG/ML IV BOLUS
INTRAVENOUS | Status: AC
  Filled 2016-02-01: qty 20

## 2016-02-01 MED ORDER — FENTANYL CITRATE (PF) 100 MCG/2ML IJ SOLN
25.0000 ug | INTRAMUSCULAR | Status: DC | PRN
  Administered 2016-02-01 (×2): 25 ug via INTRAVENOUS
  Administered 2016-02-01: 50 ug via INTRAVENOUS

## 2016-02-01 MED ORDER — PROPOFOL 10 MG/ML IV BOLUS
INTRAVENOUS | Status: DC | PRN
  Administered 2016-02-01: 200 mg via INTRAVENOUS

## 2016-02-01 MED ORDER — LIDOCAINE HCL (CARDIAC) 20 MG/ML IV SOLN
INTRAVENOUS | Status: DC | PRN
  Administered 2016-02-01: 100 mg via INTRAVENOUS

## 2016-02-01 MED ORDER — MIDAZOLAM HCL 5 MG/5ML IJ SOLN
INTRAMUSCULAR | Status: DC | PRN
  Administered 2016-02-01: 2 mg via INTRAVENOUS

## 2016-02-01 MED ORDER — FENTANYL CITRATE (PF) 100 MCG/2ML IJ SOLN
INTRAMUSCULAR | Status: DC | PRN
  Administered 2016-02-01: 100 ug via INTRAVENOUS

## 2016-02-01 MED ORDER — PROMETHAZINE HCL 25 MG/ML IJ SOLN
INTRAMUSCULAR | Status: AC
  Filled 2016-02-01: qty 1

## 2016-02-01 MED ORDER — MIDAZOLAM HCL 2 MG/2ML IJ SOLN
INTRAMUSCULAR | Status: AC
  Filled 2016-02-01: qty 2

## 2016-02-01 MED ORDER — SUGAMMADEX SODIUM 200 MG/2ML IV SOLN
INTRAVENOUS | Status: DC | PRN
Start: 2016-02-01 — End: 2016-02-01
  Administered 2016-02-01: 200 mg via INTRAVENOUS

## 2016-02-01 MED ORDER — CEFAZOLIN SODIUM-DEXTROSE 2-4 GM/100ML-% IV SOLN
INTRAVENOUS | Status: AC
  Filled 2016-02-01: qty 100

## 2016-02-01 MED ORDER — SODIUM CHLORIDE 0.9 % IR SOLN
Status: DC | PRN
  Administered 2016-02-01: 7000 mL

## 2016-02-01 SURGICAL SUPPLY — 15 items
BAG URO CATCHER STRL LF (MISCELLANEOUS) ×4 IMPLANT
BASKET LASER NITINOL 1.9FR (BASKET) ×4 IMPLANT
CATH INTERMIT  6FR 70CM (CATHETERS) ×4 IMPLANT
CLOTH BEACON ORANGE TIMEOUT ST (SAFETY) ×4 IMPLANT
GLOVE BIOGEL M STRL SZ7.5 (GLOVE) ×4 IMPLANT
GOWN STRL REUS W/TWL LRG LVL3 (GOWN DISPOSABLE) ×4 IMPLANT
GOWN STRL REUS W/TWL XL LVL3 (GOWN DISPOSABLE) ×4 IMPLANT
GUIDEWIRE STR DUAL SENSOR (WIRE) ×4 IMPLANT
MANIFOLD NEPTUNE II (INSTRUMENTS) ×4 IMPLANT
NS IRRIG 1000ML POUR BTL (IV SOLUTION) ×4 IMPLANT
PACK CYSTO (CUSTOM PROCEDURE TRAY) ×4 IMPLANT
SCRUB TECHNI CARE 4 OZ NO DYE (MISCELLANEOUS) ×4 IMPLANT
STENT URET 6FRX24 CONTOUR (STENTS) ×4 IMPLANT
TUBING CONNECTING 10 (TUBING) ×3 IMPLANT
TUBING CONNECTING 10' (TUBING) ×1

## 2016-02-01 NOTE — H&P (Deleted)
Office Visit - 12/05/2015    Ann [name]         MRN: [id number]  PROVIDER:  Hillary Bow   DOB: 05/31/1927     [id number]            Reason For Visit 3 mo cystoscopy  Active Problems Problems  1. 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 Paraphimosis (N47.2)  Assessed By: Jiles Crocker (Urology); Last Assessed: 31 Oct 2015 4. 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 Urinary incontinence without sensory awareness (N39.42)  Assessed By: Carolan Clines (Urology); Last Assessed: 05 Dec 2015 History of Present Illness 60 yo diabetic female returns today for a 3 mo cystoscopy for hx of TCC. He is s/p cysto/TURBT of multiple bladder tumors on 08/24/15. He was readmitted from 2/21 - 08/31/15 for urinary retention & acute renal failure. He is also s/p cysto/TURBT of multiple bladder tumors on 06/29/15. Most recently having issues with paraphimosis with a chronic catheter, but catheter was removed on 11/27/15. Originally referred back by Dr. Osborne Casco for further evaluation of gross hematuria. He was evaluated by Dr. Terance Hart in 2006 for PSA of 4.36, and found to have BPH only. He has been on ASA, but off now for 2 weeks, without resolution of gross painless hematuria. He also has a remote hx of DVT, currently untreated.  Past Medical History Problems  1. History of Abnormal PSA acceleration with normal PSA 2. History of CAD (coronary artery disease) (I25.10) 3. History of CKD (chronic kidney disease), stage III (N18.3) 4. History of anemia (Z86.2) 5. History of asthma (Z87.09) 6. History of diabetes mellitus (Z86.39) 7. History of hypercholesterolemia (Z86.39) 8. History of hypertension (Z86.79) 9. History of hypothyroidism (Z86.39) 10. History of  stroke [id number]) Surgical History Problems  1. History of Cath Stent Placement 2. History of Complete Colonoscopy 3. History of Cystoscopy With Fulguration Minor Lesion (Under 34mm) 4. History of Feeding Tube 5. History of Total Hip Replacement Current Meds 1. AmLODIPine Besylate 5 MG Oral Tablet; Therapy: [id number] to Recorded 2. Flovent HFA 44 MCG/ACT Inhalation Aerosol; Therapy: [phone] to Recorded 3. Furosemide TABS; Therapy: (Recorded:26Apr2017) to Recorded 4. Klor-Con M20 20 MEQ Oral Tablet Extended Release; Therapy: [id number] to Recorded 5. Lansoprazole 15 MG Oral Capsule Delayed Release; Therapy: [id number] to Recorded 6. Metoprolol Tartrate 50 MG Oral Tablet; Therapy: [id number] to Recorded 7. MiraLax POWD; Therapy: (Recorded:26Apr2017) to Recorded 8. Osmolite LIQD; Therapy: (Recorded:16Nov2016) to Recorded 9. Pravastatin Sodium 20 MG Oral Tablet; Therapy: [phone] to Recorded 10. Synthroid 100 MCG Oral Tablet; Therapy: [id number] to Recorded 11. TraMADol HCl - 50 MG Oral Tablet; Therapy: 03Oct2016 to Recorded 12. Trimethoprim 100 MG Oral Tablet; Take 1 tablet by mouth daily; Therapy: [id number] to (Evaluate:29May2017) Requested for: [id number]; Last Rx:30Mar2017 Ordered Allergies Medication  1. No Known Drug Allergies Family History Problems  1. Family history of diabetes mellitus (Z83.3) 2. Family history of Throat cancer : Father Social History Problems  1. Denied: History of Alcohol use 2. Denied: History of Caffeine use 3. Father deceased 63. Married 5. Mother deceased 30. Never a smoker 7. No alcohol use 8. Number of children  2 sons 36. Retired Review of [employer], eye, otolaryngeal, hematologic/lymphatic and psychiatric system(s) were reviewed and pertinent findings if present are noted and are otherwise negative.  Genitourinary: urinary frequency, feelings of urinary urgency, dysuria, nocturia, incontinence and hematuria, but no difficulty  starting the urinary stream, urine stream is not weak, urinary stream does not start and stop, no incomplete emptying of bladder, no post-void dribbling and initiating urination does not require straining. Voids into diaper.  Gastrointestinal: no nausea, no vomiting, no flank pain, no abdominal pain, no diarrhea, no constipation and no melena. Feeding tube  Constitutional: no fever, no night sweats, not feeling tired (fatigue), no recent weight gain and no recent weight loss.  Integumentary: no new skin rashes or lesions, no pruritus and no change in a mole.  Eyes: blurred vision and problems with eyesight.  Cardiovascular: leg swelling.  Respiratory: cough, shortness of breath during exertion and PND, but no shortness of breath and no wheezing.  Endocrine: muscle weakness, but no polydipsia.  Musculoskeletal: joint stiffness, but no back pain.  Neurological: limb weakness and difficulty walking, but no headache.  Psychiatric: anxiety and depression.  Other Symptoms: contractures.  Vitals Vital Signs [Data Includes: Last 1 Day]  Recorded: [id number] 11:56AM  Blood Pressure: 148 / 68 Temperature: 97.1 F Heart Rate: 77 Physical Exam Genitourinary: Examination of the penis demonstrates no discharge, no masses, no lesions and a normal meatus. The penis is uncircumcised. The scrotum is normal in appearance and without lesions. Examination of the right scrotum demonstrates no mass. Examination of the left scrotum demostrates no mass. The right epididymis is palpably normal and non-tender. The left epididymis is palpably normal and non-tender. The right testis is palpably normal, non-tender and without masses. The left testis is normal, non-tender and without masses. Urethral meatal erosion from prior chronic foley.  Procedure Procedure: Cystoscopy  Chaperone Present: kim lewis.  Indication: History of Urothelial Carcinoma.  Informed Consent: Risks, benefits, and potential adverse events were  discussed and informed consent was obtained from the patient.  Prep: The patient was prepped with betadine.  Anesthesia:. Local anesthesia was administered intraurethrally with 2% lidocaine jelly.  Antibiotic prophylaxis: Ciprofloxacin.  Procedure Note:  Urethral meatus:Marland Kitchen Meatal erosion.  Anterior urethra: No abnormalities.  Prostatic urethra:. The lateral and median prostatic lobes were enlarged. An enlarged intravesical median lobe was visualized.  Bladder: Visulization was obscured due to cloudy urine. Examination of the bladder demonstrated a small amount of fresh clot within the bladder and trabeculation, but no diverticulum cellules, but no fistula, no erythematous mucosa, no ulcer and no edema. Multiple tumors were identified in the bladder. A papillary tumor was seen in the bladder measuring approximately 3 cm in size. This tumor was located on the right side, on the anterior aspect, near the trigone of the bladder. Another papillary tumor was seen in the bladder measuring approximately 3 cm in size. This tumor was located on the right side, at the base of the bladder.  Assessment Assessed  1. Gross hematuria (R31.0) 2. 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 Urge incontinence of urine (N39.41) 6. Elevated prostate specific antigen (PSA) (R97.20) 7. Urinary incontinence without sensory awareness (N39.42) Multiple recurrences of high grade non-invasive tumor.  Plan TUR=BT.  Discussion/Summary cc: Dr. Watt Climes  cc: Domenick Gong, MD  Signatures Electronically signed by : Carolan Clines, M.D.; Dec 05 2015 5:23PM EST   The information contained in this medical record document is considered private and confidential patient information. This information can only be used for the medical diagnosis and/or medical services  that are being provided by the  patient's selected caregivers. This information can only be distributed outside of the patient's care if the patient agrees and signs waivers of authorization for this information to be sent to an outside source or route.

## 2016-02-01 NOTE — Interval H&P Note (Deleted)
History and Physical Interval Note:  02/01/2016 10:31 AM  Ann [name]  has presented today for surgery, with the diagnosis of LEFT URETERAL CALCULI  The various methods of treatment have been discussed with the patient and family. After consideration of risks, benefits and other options for treatment, the patient has consented to  Procedure(s): CYSTO, LEFT URETEROSCOPY, LEFT URETERAL STENT PLACEMENT (Left) HOLMIUM LASER LITHO (N/A) as a surgical intervention .  The patient's history has been reviewed, patient examined, no change in status, stable for surgery.  I have reviewed the patient's chart and labs.  Questions were answered to the patient's satisfaction.     Sharyah Bostwick I [employer] Humm

## 2016-02-01 NOTE — Discharge Instructions (Addendum)
Dietary Guidelines to Help Prevent Kidney Stones Your risk of kidney stones can be decreased by adjusting the foods you eat. The most important thing you can do is drink enough fluid. You should drink enough fluid to keep your urine clear or pale yellow. The following guidelines provide specific information for the type of kidney stone you have had. GUIDELINES ACCORDING TO TYPE OF KIDNEY STONE Calcium Oxalate Kidney Stones  Reduce the amount of salt you eat. Foods that have a lot of salt cause your body to release excess calcium into your urine. The excess calcium can combine with a substance called oxalate to form kidney stones.  Reduce the amount of animal protein you eat if the amount you eat is excessive. Animal protein causes your body to release excess calcium into your urine. Ask your dietitian how much protein from animal sources you should be eating.  Avoid foods that are high in oxalates. If you take vitamins, they should have less than 500 mg of vitamin C. Your body turns vitamin C into oxalates. You do not need to avoid fruits and vegetables high in vitamin C. Calcium Phosphate Kidney Stones  Reduce the amount of salt you eat to help prevent the release of excess calcium into your urine.  Reduce the amount of animal protein you eat if the amount you eat is excessive. Animal protein causes your body to release excess calcium into your urine. Ask your dietitian how much protein from animal sources you should be eating.  Get enough calcium from food or take a calcium supplement (ask your dietitian for recommendations). Food sources of calcium that do not increase your risk of kidney stones include:  Broccoli.  Dairy products, such as cheese and yogurt.  Pudding. Uric Acid Kidney Stones  Do not have more than 6 oz of animal protein per day. FOOD SOURCES Animal Protein Sources  Meat (all types).  Poultry.  Eggs.  Fish, seafood. Foods High in [employer] seasonings.  Soy  sauce.  Teriyaki sauce.  Cured and processed meats.  Salted crackers and snack foods.  Fast food.  Canned soups and most canned foods. Foods High in Oxalates  Grains:  Amaranth.  Barley.  Grits.  Wheat germ.  Bran.  Buckwheat flour.  All bran cereals.  Pretzels.  Whole wheat bread.  Vegetables:  Beans (wax).  Beets and beet greens.  Collard greens.  Eggplant.  Escarole.  Leeks.  Okra.  Parsley.  Rutabagas.  Spinach.  Swiss chard.  Tomato paste.  Fried potatoes.  Sweet potatoes.  Fruits:  Red currants.  Figs.  Kiwi.  Rhubarb.  Meat and Other Protein Sources:  Beans (dried).  Soy burgers and other soybean products.  Miso.  Nuts (peanuts, almonds, pecans, cashews, hazelnuts).  Nut butters.  Sesame seeds and tahini (paste made of sesame seeds).  Poppy seeds.  Beverages:  Chocolate drink mixes.  Soy milk.  Instant iced tea.  Juices made from high-oxalate fruits or vegetables.  Other:  Carob.  Chocolate.  Fruitcake.  Marmalades.   This information is not intended to replace advice given to you by your health care provider. Make sure you discuss any questions you have with your health care provider.   Document Released: 10/18/2010 Document Revised: 06/28/2013 Document Reviewed: 05/20/2013 Elsevier Interactive Patient Education 2016 Elsevier Inc[employer]    Ureteral Stent Implantation, Care After Refer to this sheet in the next few weeks. These instructions provide you with information on caring for yourself after your procedure. Your health  care provider may also give you more specific instructions. Your treatment has been planned according to current medical practices, but problems sometimes occur. Call your health care provider if you have any problems or questions after your procedure. WHAT TO EXPECT AFTER THE PROCEDURE You should be back to normal activity within 48 hours after the procedure. Nausea and  vomiting may occur and are commonly the result of anesthesia. It is common to experience sharp pain in the back or lower abdomen and penis with voiding. This is caused by movement of the ends of the stent with the act of urinating.It usually goes away within minutes after you have stopped urinating. HOME CARE INSTRUCTIONS Make sure to drink plenty of fluids. You may have small amounts of bleeding, causing your urine to be red. This is normal. Certain movements may trigger pain or a feeling that you need to urinate. You may be given medicines to prevent infection or bladder spasms. Be sure to take all medicines as directed. Only take over-the-counter or prescription medicines for pain, discomfort, or fever as directed by your health care provider. Do not take aspirin, as this can make bleeding worse. Your stent will be left in until the blockage is resolved. This may take 2 weeks or longer, depending on the reason for stent implantation. You may have an X-ray exam to make sure your ureter is open and that the stent has not moved out of position (migrated). The stent can be removed by your health care provider in the office. Medicines may be given for comfort while the stent is being removed. Be sure to keep all follow-up appointments so your health care provider can check that you are healing properly. SEEK MEDICAL CARE IF:  You experience increasing pain.  Your pain medicine is not working. SEEK IMMEDIATE MEDICAL CARE IF:  Your urine is dark red or has blood clots.  You are leaking urine (incontinent).  You have a fever, chills, feeling sick to your stomach (nausea), or vomiting.  Your pain is not relieved by pain medicine.  The end of the stent comes out of the urethra.  You are unable to urinate.   This information is not intended to replace advice given to you by your health care provider. Make sure you discuss any questions you have with your health care provider.   Document Released:  02/23/2013 Document Revised: 06/28/2013 Document Reviewed: 01/05/2015 Elsevier Interactive Patient Education 2016 [place] Anesthesia, Adult, Care After Refer to this sheet in the next few weeks. These instructions provide you with information on caring for yourself after your procedure. Your health care provider may also give you more specific instructions. Your treatment has been planned according to current medical practices, but problems sometimes occur. Call your health care provider if you have any problems or questions after your procedure. WHAT TO EXPECT AFTER THE PROCEDURE After the procedure, it is typical to experience:  Sleepiness.  Nausea and vomiting. HOME CARE INSTRUCTIONS  For the first 24 hours after general anesthesia:  Have a responsible person with you.  Do not drive a car. If you are alone, do not take public transportation.  Do not drink alcohol.  Do not take medicine that has not been prescribed by your health care provider.  Do not sign important papers or make important decisions.  You may resume a normal diet and activities as directed by your health care provider.  Change bandages (dressings) as directed.  If you have questions or  problems that seem related to general anesthesia, call the hospital and ask for the anesthetist or anesthesiologist on call. SEEK MEDICAL CARE IF:  You have nausea and vomiting that continue the day after anesthesia.  You develop a rash. SEEK IMMEDIATE MEDICAL CARE IF:   You have difficulty breathing.  You have chest pain.  You have any allergic problems.   This information is not intended to replace advice given to you by your health care provider. Make sure you discuss any questions you have with your health care provider.   Document Released: 09/29/2000 Document Revised: 07/14/2014 Document Reviewed: 10/22/2011 Elsevier Interactive Patient Education [employer].

## 2016-02-01 NOTE — Anesthesia Postprocedure Evaluation (Signed)
Anesthesia Post Note  Patient: Ann [name]  Procedure(s) Performed: Procedure(s) (LRB): CYSTO, LEFT URETEROSCOPY, LEFT RETROGRADE AND LEFT URETERAL STENT PLACEMENT (Left)  Patient location during evaluation: PACU Anesthesia Type: General Level of consciousness: awake and alert Pain management: pain level controlled Vital Signs Assessment: post-procedure vital signs reviewed and stable Respiratory status: spontaneous breathing, nonlabored ventilation, respiratory function stable and patient connected to nasal cannula oxygen Cardiovascular status: blood pressure returned to baseline and stable Postop Assessment: no signs of nausea or vomiting Anesthetic complications: no    Last Vitals:  Vitals:   02/01/16 1338 02/01/16 1435  BP: (!) 115/55 129/68  Pulse: 62 63  Resp: 16 18  Temp: 36.6 C 36.4 C    Last Pain:  Vitals:   02/01/16 1345  TempSrc:   PainSc: 3                  Keymora Grillot J

## 2016-02-01 NOTE — H&P (Signed)
Office Visit Report     01/02/2016   --------------------------------------------------------------------------------   Ann [name]  MRN: [id number]  PRIMARY CARE:  Berkley Harvey, NP  DOB: 1955-11-12, 60 year old Female  REFERRING:  Benito Mccreedy, MD  SSN: [id number]  PROVIDER:  Raynelle Bring, M.D.    LOCATION:  [facility] [phone]   --------------------------------------------------------------------------------   CC/HPI: Ann [name] is a 60 year old female who is a patient of Dr. Era Bumpers. She was recently seen in May by Diane after having been diagnosed with 2 distal ureteral calculi. She was treated with medical expulsion therapy and has been taking tamsulosin. She is continued having pain. She did have a follow-up scheduled for June 13 with a KUB but apparently was unaware and did not follow up for that appointment with Dr. Era Bumpers. She was now worked in today for further evaluation. She denies any fever or worsening pain. She has not been taking Percocet for fear of constipation. However, she has had symptoms including constant urinary urgency, lower pelvic pain, and intermittent dysuria. She has not noted passing any stones.     ALLERGIES: No Allergies    MEDICATIONS: Cipro 500 mg tablet 1 tablet PO Daily  AmLODIPine Besylate 10 MG Oral Tablet Oral  Aspirin 81 MG TABS Oral  ClonazePAM TABS Oral  Furosemide TABS Oral  Metoprolol Tartrate TABS Oral  Polyethylene Glycol 3350 Oral Powder 0 Oral  Prevacid 24HR 15 MG Oral Capsule Delayed Release Oral  Promethazine HCl - 12.5 MG Oral Tablet 0 Oral  Tamsulosin HCl - 0.4 MG Oral Capsule 0 Oral Daily  Topiramate 50 MG Oral Tablet Oral     GU PSH: Bladder Repair - 2014 Cysto Uretero Lithotripsy - 06/06/2014 Cysto Uretero Remove Stone - 11/27/2014 Cystoscopy Insert Stent - 11/27/2014, 06/06/2014, 05/03/2014 Insert Mesh/pelvic Flr Addon - 02/28/2014 Repair of Rectocele - 02/28/2014 Repair Vaginal Prolapse - 2014 Sling  - 2014      PSH Notes: Cystoscopy With Ureteroscopy With Removal Of Calculus, Cystoscopy With Insertion Of Ureteral Stent Right, Esophageal Dilation, Cystoscopy With Insertion Of Ureteral Stent Right, Cystoscopy With Ureteroscopy With Lithotripsy, Cystoscopy With Insertion Of Ureteral Stent Right, Vaginal Surg Insertion Of Prosthesis For Pelvic Floor Repair, Posterior Colporrhaphy (For Pelvic Relaxation), Enterocele Repair, Vag. Approach (Sep Procedure), Sacrospinous Ligament Fixation For Posthysterectomy Prolapse, Anterior Colporrhaphy, Repair Of Cystocele, Mid-Urethral Sling Operation, Tubal Ligation, Knee Arthroscopy, Esophageal Dilation   NON-GU PSH: Repair Bowel Bulge - 02/28/2014 Tubal Ligation - 2014    GU PMH: Calculus Ureter, Calculus of left ureter - 11/20/2015, Calculus of distal right ureter, - 12/14/2014, Calculus of right ureter, - 12/13/2014 Hydronephrosis with renal and ureteral calculous obstruction, Hydronephrosis with obstructing calculus - 11/20/2015 Other microscopic hematuria, Microhematuria - 11/20/2015 Rectocele, Rectocele, female - 03/28/2015, Rectocele, female, - 03/28/2015 Urinary Frequency, Urinary frequency - 03/28/2015 Dorsalgia, Unspec, Mid back pain on right side - 12/14/2014 Hydronephrosis Unspec, Hydronephrosis, right - 11/21/2014, Hydronephrosis, - 04/20/2014 Recurrent Cystitis w/o hematuria, Chronic cystitis - 06/12/2014 Kidney Stone, Nephrolithiasis - 04/21/2014 Urinary Tract Inf, Unspec site, Pyuria - 04/20/2014, Urinary tract infection, - 04/20/2014 Postmenopausal atrophic vaginitis, Atrophic vaginitis - 02/28/2014 Oth GU systems Signs/Symptoms, Bladder pain - 2015 Pelvic and perineal pain, Vaginal pain - 2015 Female genital prolapse, unspecified, Vaginal vault prolapse - 2015 Stress Incontinence, M/F, Female stress incontinence - 2014 Urinary Retention, Unspec, Incomplete bladder emptying - 2014 Isolated proteinuria, Isolated proteinuria - 2014 Nocturia, Nocturia -  2014    NON-GU PMH: Nausea with vomiting, unspecified, Nausea with vomiting -  11/20/2015 Encounter for general adult medical examination without abnormal findings, Encounter for preventive health examination - 11/21/2014 Other constipation, Chronic constipation - 03/09/2014 Personal history of Methicillin resistant Staphylococcus aureus infection, History of methicillin resistant Staphylococcus aureus infection - 2015 Other abnormal glucose, Elevated hemoglobin A1c - 2015 Sleep apnea, unspecified, Sleep apnea - 2015 Anxiety disorder, unspecified, Anxiety - 2014 Obstructive sleep apnea (adult) (pediatric), Obstructive sleep apnea, adult - 2014 Personal history of other diseases of the circulatory system, History of hypertension - 2014 Personal history of other diseases of the digestive system, History of hiatal hernia - 2014, History of gastroesophageal reflux (GERD), - 2014 Personal history of other diseases of the musculoskeletal system and connective tissue, History of arthritis - 2014 Personal history of other diseases of the nervous system and sense organs, History of neuropathy - 2014 Personal history of other endocrine, nutritional and metabolic disease, History of hyperlipidemia - 2014 Personal history of transient ischemic attack (TIA), and cerebral infarction without residual deficits, History of stroke - 2014 Vitamin D deficiency, unspecified, Vitamin D deficiency - 2014    FAMILY HISTORY: Cardiac Arrest - Runs In Family cardiac disorder - Runs In Family Deceased - Runs In Family malignant neoplasm - Runs In Family Tuberculosis - Runs In Family   SOCIAL HISTORY: Marital Status: Married Current Smoking Status: Patient has never smoked.  Has never drank.  Drinks 2 caffeinated drinks per day.     Notes: Never smoker, Married, Number of children, Caffeine use, Alcohol use, No alcohol use   REVIEW OF SYSTEMS:    GU Review Female:   Patient reports get up at night to urinate and  have to strain to urinate. Patient denies currently pregnant, frequent urination, leakage of urine, burning /pain with urination, stream starts and stops, trouble starting your stream, and hard to postpone urination.  Gastrointestinal (Upper):   Patient denies nausea and vomiting.  Gastrointestinal (Lower):   Patient denies diarrhea and constipation.  Constitutional:   Patient denies fever, night sweats, weight loss, and fatigue.  Skin:   Patient denies skin rash/ lesion and itching.  Eyes:   Patient denies blurred vision and double vision.  Ears/ Nose/ Throat:   Patient denies sore throat and sinus problems.  Hematologic/Lymphatic:   Patient denies swollen glands and easy bruising.  Cardiovascular:   Patient denies leg swelling and chest pains.  Respiratory:   Patient denies cough and shortness of breath.  Endocrine:   Patient denies excessive thirst.  Musculoskeletal:   Patient denies back pain and joint pain.  Neurological:   Patient denies headaches and dizziness.  Psychologic:   Patient denies depression and anxiety.   VITAL SIGNS:      01/02/2016 09:24 AM  Weight 229 lb / 103.87 kg  Height 62 in / 157.48 cm  BP 121/81 mmHg  Pulse 66 /min  Temperature 97.8 F / 37 C  BMI 41.9 kg/m   MULTI-SYSTEM PHYSICAL EXAMINATION:    Constitutional: Well-nourished. No physical deformities. Normally developed. Good grooming.  Neck: Neck symmetrical, not swollen. Normal tracheal position.  Respiratory: No labored breathing, no use of accessory muscles.   Cardiovascular: Normal temperature, normal extremity pulses, no swelling, no varicosities.  Skin: No paleness, no jaundice, no cyanosis. No lesion, no ulcer, no rash.  Gastrointestinal: No mass, no tenderness, no rigidity, obese abdomen.   Eyes: Normal conjunctivae. Normal eyelids.  Ears, Nose, Mouth, and Throat: Left ear no scars, no lesions, no masses. Right ear no scars, no lesions, no masses. Nose no scars,  no lesions, no masses. Normal  hearing. Normal lips.     PAST DATA REVIEWED:  Source Of History:  Patient  X-Ray Review: C.T. Abdomen/Pelvis: Reviewed Films.    Notes:                     I reviewed her CT scan was performed on 6/26. This demonstrates 2 distal ureteral stones including one at the UVJ measuring approximately 5 mm and one slightly above that measuring 4 mm.   PROCEDURES:          Urinalysis w/Scope - 81001 Dipstick Dipstick Cont'd Micro  Specimen: Voided Bilirubin: Neg WBC/hpf: 0-5/hpf  Color: Yellow Ketones: Neg RBC/hpf: 3-10/hpf  Appearance: Clear Blood: 2+ Bacteria: NS (Not Seen)  Specific Gravity: 1.025 Protein: Neg Cystals: NS (Not Seen)  pH: 6.0 Urobilinogen: 0.2 Casts: NS (Not Seen)  Glucose: Neg Nitrites: Neg Trichomonas: Not Present    Leukocyte Esterase: Neg Mucous: Not Present      Epithelial Cells: 0-5/hpf      Yeast: NS (Not Seen)      Sperm: Not Present    ASSESSMENT:      ICD-10 Details  1 GU:   Calculus Ureter - N20.1    PLAN:           Schedule Return Visit: Next Available Appointment - Schedule Surgery             Note: Will call to schedule surgery          Document Letter(s):  Created for Patient: Clinical Summary         Notes:   1. Distal left ureteral calculi: She is currently not in any distress and does not require urgent treatment today. She has been counseled that should she develop fever, persistent nausea/vomiting, or uncontrolled pain that she should call for more urgent treatment. Otherwise, I have encouraged her to take her Percocet if she does have significant pain. I have also provided her samples of Myrbetriq today to help with her lower urinary tract symptoms. Considering she has not passed her stone over a month, she will be scheduled fortreatment. We have reviewed options and have agreed for her to proceed with left ureteroscopic laser lithotripsy. This will be scheduled with Dr. Era Bumpers sometime over the next 1-2 weeks.   cc: Dr. Carolan Clines     Signed by Raynelle Bring, M.D. on 01/02/16 at 6:13 PM (EST)     The information contained in this medical record document is considered private and confidential patient information. This information can only be used for the medical diagnosis and/or medical services that are being provided by the patient's selected caregivers. This information can only be distributed outside of the patient's care if the patient agrees and signs waivers of authorization for this information to be sent to an outside source or route.

## 2016-02-01 NOTE — Anesthesia Preprocedure Evaluation (Addendum)
Anesthesia Evaluation  Patient identified by MRN, date of birth, ID band Patient awake and Patient confused    Reviewed: Allergy & Precautions, NPO status , Patient's Chart, lab work & pertinent test results  History of Anesthesia Complications (+) Family history of anesthesia reaction and history of anesthetic complications  Airway Mallampati: II  TM Distance: >3 FB Neck ROM: Full    Dental no notable dental hx.    Pulmonary sleep apnea and Continuous Positive Airway Pressure Ventilation ,    Pulmonary exam normal breath sounds clear to auscultation       Cardiovascular hypertension, Pt. on medications Normal cardiovascular exam Rhythm:Regular Rate:Normal     Neuro/Psych  Headaches, Anxiety  Neuromuscular disease CVA    GI/Hepatic Neg liver ROS, hiatal hernia, GERD  ,  Endo/Other  negative endocrine ROS  Renal/GU Renal disease  negative genitourinary   Musculoskeletal  (+) Arthritis ,   Abdominal (+) + obese,   Peds negative pediatric ROS (+)  Hematology  (+) anemia ,   Anesthesia Other Findings   Reproductive/Obstetrics negative OB ROS                             Anesthesia Physical Anesthesia Plan  ASA: II  Anesthesia Plan: General   Post-op Pain Management:    Induction: Intravenous  Airway Management Planned: Oral ETT  Additional Equipment:   Intra-op Plan: Utilization Of Total Body Hypothermia per surgeon request  Post-operative Plan: Extubation in OR  Informed Consent: I have reviewed the patients History and Physical, chart, labs and discussed the procedure including the risks, benefits and alternatives for the proposed anesthesia with the patient or authorized representative who has indicated his/her understanding and acceptance.   Dental advisory given  Plan Discussed with: CRNA  Anesthesia Plan Comments: (Plan OETT. She has aspirated in the past with  anesthesia.)        Anesthesia Quick Evaluation

## 2016-02-01 NOTE — Op Note (Signed)
Pre-operative diagnosis :  Distal Left ureteral stones x 2  Postoperative diagnosis:  Passed ureteral stones  Operation:  Cystoscopy, Left retrograde pyelogram with interpretation, Left ureteroscopy, Pyeloscopy, and placement of Left JJ stent , 67F x 26 CM with tether.   Surgeon:  Chauncey Cruel. Gaynelle Arabian, MD  First assistant:  None  Anesthesia: GET  Preparation:  After appropriate pre-anesthetic, the patient brought the operating room, placed on the operating table in the dorsal supine position where general endotracheal anesthesia once as patient preference) was administered. She was then replaced in the dorsal lithotomy position with pubis was prepped with Betadine solution and draped in usual fashion. The history was reviewed. CT scan was reviewed and placed in the operating room on the monitor. The left arm was previously marked.  Review history:  --------------------------------------------------------------------------------       HPI: Ann [name] is a 60 year old female who is a patient of Dr. Era Bumpers. She was recently seen in May by Diane after having been diagnosed with 2 distal ureteral calculi. She was treated with medical expulsion therapy and has been taking tamsulosin. She is continued having pain. She did have a follow-up scheduled for June 13 with a KUB but apparently was unaware and did not follow up for that appointment with Dr. Era Bumpers. She was now worked in today for further evaluation. She denies any fever or worsening pain. She has not been taking Percocet for fear of constipation. However, she has had symptoms including constant urinary urgency, lower pelvic pain, and intermittent dysuria. She has not noted passing any stones.   Statement of  Likelihood of Success: Excellent. TIME-OUT observed.:  Procedure:  Cystourethroscopy was accomplished, and shows normal urethra, and normal trigone. Clear reflux was seen from both ureteral orifices. Left retrograde pyelogram performed, which  shows distended left intramural ureter, but normal proximal ureter. There is no proximal hydronephrosis, and normal-appearing renal pelvis. No complications are seen within the entire length of the ureter, or the kidney.  An 038 guidewire is then passed into the renal pelvis and coiled. The cystoscope was removed, and the dual lumen ureteroscope was then passed into the intramural ureter. Edema is identified within the intramural ureter, but no stone can be seen. Careful inspection of the entire ureter is accomplished, and normal ureters seen proximal to the intramural portion of the distal ureter. The renal pelvises inspected and no stones are identified within the entire ureter. A basket was passed through the ureteroscope and opened, in the renal pelvis. The ureter scope was then drawn toward, in case stone can be identified and basket extracted. However, no stones can be seen. Careful inspection of the bladder reveals no evidence of stones passed. Because of the edema of the intramural ureter, and presumed recent passage of the distal stones as seen on CT, I elected to place a 6 [place] by 24 cm double-J stent with tether in place. This was accomplished over the 0.038 guidewire, which was coiled in the renal pelvis, and in the bladder. The patient will be able to remove this by herself.  Because of her asthma, she was not given IV Toradol. She was awakened, and taken to recovery room in good condition.

## 2016-02-01 NOTE — Interval H&P Note (Deleted)
History and Physical Interval Note:  02/01/2016 10:31 AM  Ann [name]  has presented today for surgery, with the diagnosis of LEFT URETERAL CALCULI  The various methods of treatment have been discussed with the patient and family. After consideration of risks, benefits and other options for treatment, the patient has consented to  Procedure(s): CYSTO, LEFT URETEROSCOPY, LEFT URETERAL STENT PLACEMENT (Left) HOLMIUM LASER LITHO (N/A) as a surgical intervention .  The patient's history has been reviewed, patient examined, no change in status, stable for surgery.  I have reviewed the patient's chart and labs.  Questions were answered to the patient's satisfaction.     Elzabeth Mcquerry I Carmin Dibartolo

## 2016-02-01 NOTE — Transfer of Care (Signed)
Immediate Anesthesia Transfer of Care Note  Patient: Ann [name]  Procedure(s) Performed: Procedure(s): CYSTO, LEFT URETEROSCOPY, LEFT RETROGRADE AND LEFT URETERAL STENT PLACEMENT (Left)  Patient Location: PACU  Anesthesia Type:General  Level of Consciousness:  sedated, patient cooperative and responds to stimulation  Airway & Oxygen Therapy:Patient Spontanous Breathing and Patient connected to face mask oxgen  Post-op Assessment:  Report given to PACU RN and Post -op Vital signs reviewed and stable  Post vital signs:  Reviewed and stable  Last Vitals:  Vitals:   02/01/16 0925  BP: (!) 145/75  Pulse: 60  Resp: 18  Temp: 123XX123 C    Complications: No apparent anesthesia complications

## 2016-02-01 NOTE — Anesthesia Procedure Notes (Signed)
Procedure Name: Intubation Performed by: Niyana Chesbro J Pre-anesthesia Checklist: Patient identified, Emergency Drugs available, Suction available, Patient being monitored and Timeout performed Patient Re-evaluated:Patient Re-evaluated prior to inductionOxygen Delivery Method: Circle system utilized Preoxygenation: Pre-oxygenation with 100% oxygen Intubation Type: IV induction Ventilation: Mask ventilation without difficulty Laryngoscope Size: Mac and 4 Grade View: Grade I Tube type: Oral Tube size: 7.0 mm Number of attempts: 1 Airway Equipment and Method: Stylet Placement Confirmation: ETT inserted through vocal cords under direct vision,  positive ETCO2,  CO2 detector and breath sounds checked- equal and bilateral Secured at: 21 cm Tube secured with: Tape Dental Injury: Teeth and Oropharynx as per pre-operative assessment        

## 2016-02-05 ENCOUNTER — Other Ambulatory Visit: Payer: Self-pay | Admitting: *Deleted

## 2016-02-05 DIAGNOSIS — R102 Pelvic and perineal pain: Secondary | ICD-10-CM

## 2016-02-06 NOTE — H&P (Signed)
Interval H/P:    Ann [name] presents today for surgery with the diagnosis of Left ureteral calculi. The various methods of treatment have been discussed with the patient and family. After consideratoin of risks, benefits an d other options for treatment, the patient has consented to treatment: cysto, Left ureteroscopy, Left ureteral stent placement, Holmium laser lithotripsy ( NA), as a surgical intervention. The patient's has been reviewed, the patient examined, no change in status, stable for surgery. I have reviewed the patient's chart and labs. Questions were answered to patient's satisfaction.

## 2016-02-07 ENCOUNTER — Telehealth: Payer: Self-pay | Admitting: *Deleted

## 2016-02-07 ENCOUNTER — Encounter: Payer: Self-pay | Admitting: *Deleted

## 2016-02-07 NOTE — Telephone Encounter (Signed)
precert completed by staff member.

## 2016-02-07 NOTE — Telephone Encounter (Signed)
Tillie Rung called stating need a precert for pelvic US ordered by Dr. Kennon Rounds - [place] being done tomorrow.

## 2016-02-08 ENCOUNTER — Ambulatory Visit (HOSPITAL_COMMUNITY): Admission: RE | Admit: 2016-02-08 | Payer: Medicaid Other | Source: Ambulatory Visit

## 2016-02-08 ENCOUNTER — Encounter (HOSPITAL_COMMUNITY): Payer: Self-pay

## 2016-02-28 ENCOUNTER — Ambulatory Visit (INDEPENDENT_AMBULATORY_CARE_PROVIDER_SITE_OTHER): Payer: Medicaid Other | Admitting: Podiatry

## 2016-02-28 VITALS — BP 150/78 | HR 65 | Resp 16

## 2016-02-28 DIAGNOSIS — M79676 Pain in unspecified toe(s): Secondary | ICD-10-CM | POA: Diagnosis not present

## 2016-02-28 DIAGNOSIS — B351 Tinea unguium: Secondary | ICD-10-CM

## 2016-02-28 DIAGNOSIS — G629 Polyneuropathy, unspecified: Secondary | ICD-10-CM | POA: Diagnosis not present

## 2016-02-28 NOTE — Progress Notes (Signed)
   Subjective:    Patient ID: Ann [name], female    DOB: 1956/06/07, 59 y.o.   MRN: [id number]  HPI this patient presents to the office with chief complaint of long thick nails, especially her to big toenails. She gives a history of having injured both of these nails and she says she feels that the right great toenail moves as she walks. This patient is also has been diagnosed with Polly neuropathy. She denies being diagnosed as diabetic. She presents the office today for an evaluation and treatment of both her feet    Review of Systems  All other systems reviewed and are negative.      Objective:   Physical Exam GENERAL APPEARANCE: Alert, conversant. Appropriately groomed. No acute distress.  VASCULAR: Pedal pulses are  palpable at  [facility] and PT bilateral.  Capillary refill time is immediate to all digits,  Normal temperature gradient.   NEUROLOGIC: sensation is absent  to 5.07 monofilament at 5/5 sites bilateral., Muscle strength normal.  MUSCULOSKELETAL: acceptable muscle strength, tone and stability bilateral.  Intrinsic muscluature intact bilateral.  HAV  1st MPJ  B/L   DERMATOLOGIC: skin color, texture, and turgor are within normal limits.  No preulcerative lesions or ulcers  are seen, no interdigital maceration noted.  No open lesions present.   No drainage noted .Red irritated skin lesion noted.  NAILS  Thick disfigured discolored toenails on both feet. Her great toenail on her right foot is unattached to the nailbed. No redness, swelling or pain associated with these nails         Assessment & Plan:  Onychomycosis  B/L  Dermatitis  IE  Debridement of nails.  Told her to apply lamisil to her skin rash.  Told her I am very concerned about her absence of the LOPS.  My evaluation reveals she has neuropathy with absent LOPS.  I told her to tell her medical doctor that she is having severe pain at night which sounds like she is having neuropathy pain.  RTC 3 months.   Gardiner Barefoot DPM

## 2016-03-06 ENCOUNTER — Encounter: Payer: Self-pay | Admitting: Obstetrics and Gynecology

## 2016-03-06 ENCOUNTER — Encounter: Payer: Medicaid Other | Admitting: Obstetrics and Gynecology

## 2016-03-06 NOTE — Progress Notes (Signed)
Patient did not keep GYN referral visit for today  Durene Romans MD Attending Center for [facility])

## 2016-05-16 ENCOUNTER — Encounter: Payer: Medicaid Other | Admitting: Neurology

## 2016-05-22 ENCOUNTER — Ambulatory Visit: Payer: Medicaid Other | Admitting: Podiatry

## 2016-08-07 HISTORY — PX: COLONOSCOPY: SHX174

## 2016-08-13 ENCOUNTER — Ambulatory Visit (INDEPENDENT_AMBULATORY_CARE_PROVIDER_SITE_OTHER): Payer: Medicaid Other | Admitting: Physician Assistant

## 2016-08-13 ENCOUNTER — Encounter: Payer: Self-pay | Admitting: Physician Assistant

## 2016-08-13 VITALS — BP 116/74 | HR 84 | Ht 62.0 in | Wt 236.0 lb

## 2016-08-13 DIAGNOSIS — Z8719 Personal history of other diseases of the digestive system: Secondary | ICD-10-CM | POA: Diagnosis not present

## 2016-08-13 DIAGNOSIS — R1319 Other dysphagia: Secondary | ICD-10-CM | POA: Diagnosis not present

## 2016-08-13 DIAGNOSIS — Z800 Family history of malignant neoplasm of digestive organs: Secondary | ICD-10-CM

## 2016-08-13 MED ORDER — NA SULFATE-K SULFATE-MG SULF 17.5-3.13-1.6 GM/177ML PO SOLN: 1.0000 | Freq: Once | ORAL | 0 refills | Status: AC

## 2016-08-13 NOTE — Patient Instructions (Addendum)
Continue Omeprazole 40 mg.  You have been scheduled for an endoscopy and colonoscopy. Please follow the written instructions given to you at your visit today. Please pick up your prep supplies at the pharmacy within the next 1-3 days. [facility] [address].  If you use inhalers (even only as needed), please bring them with you on the day of your procedure. Your physician has requested that you go to www.startemmi.com and enter the access code given to you at your visit today. This web site gives a general overview about your procedure. However, you should still follow specific instructions given to you by our office regarding your preparation for the procedure.

## 2016-08-13 NOTE — Progress Notes (Addendum)
Subjective:    Patient ID: Ann [name], female    DOB: 1955-09-09, 61 y.o.   MRN: [id number]  HPI Prior history of diverticulitis. She had undergone an EGD in February 2017 with finding of a stenosis at the GE junction and a 3 cm hiatal hernia. She was dilated to 18 mm. She also underwent high-resolution manometry March 2017 which was read as a normal study with no evidence of dysmotility. She has been maintained on Prilosec 40 mg by mouth twice a day. She comes in today with recurrent complaints of dysphagia. She says that the previous dilation definitely helped her and she didn't have any symptoms for almost a year. She has had recurrence of symptoms now over the past month and says that she is having very frequent episodes of food sticking a requiring regurgitation. He has no complaints of heartburn or indigestion. She is concerned as she feels she is aspirated at least once after having something become stuck.  Patient has positive family history of colon cancer in her father is deceased secondary to colon cancer. She has not had prior colonoscopy area did she has no current lower GI complaints, specifically no changes in bowel habits melena or hematochezia.  Review of Systems Pertinent positive and negative review of systems were noted in the above HPI section.  All other review of systems was otherwise negative.  Outpatient Encounter Prescriptions as of 08/13/2016  Medication Sig  . albuterol (PROAIR HFA) 108 (90 Base) MCG/ACT inhaler Inhale 2 puffs into the lungs every 4 (four) hours as needed. For cough wheezing.  Marland Kitchen albuterol (PROVENTIL) (2.5 MG/3ML) 0.083% nebulizer solution Inhale 3 mLs into the lungs every 6 (six) hours as needed. Wheezing/shortness of breath  . amLODipine (NORVASC) 10 MG tablet Take 10 mg by mouth daily.   Marland Kitchen aspirin EC 81 MG tablet Take 81 mg by mouth daily.  . chlorpheniramine (CHLOR-TRIMETON) 4 MG tablet Take 1 tablet (4 mg total) by mouth at bedtime.  . clonazePAM  (KLONOPIN) 0.5 MG tablet Take 0.5 mg by mouth 3 (three) times daily as needed for anxiety.  . famotidine (PEPCID) 20 MG tablet One at bedtime  . fluticasone (FLONASE) 50 MCG/ACT nasal spray Place 2 sprays into both nostrils daily. (Patient taking differently: Place 2 sprays into both nostrils daily as needed for allergies. )  . furosemide (LASIX) 40 MG tablet Take 40 mg by mouth daily as needed for fluid.   . metoprolol succinate (TOPROL-XL) 50 MG 24 hr tablet Take 50 mg by mouth daily.  . Multiple Vitamin (MULTIVITAMIN WITH MINERALS) TABS tablet Take 1 tablet by mouth daily.  Marland Kitchen omeprazole (PRILOSEC) 40 MG capsule Take 1 capsule (40 mg total) by mouth 2 (two) times daily.  Marland Kitchen Respiratory Therapy Supplies (FLUTTER) DEVI Use as directed  . topiramate (TOPAMAX) 50 MG tablet Take 50 mg by mouth 2 (two) times daily.  . Na Sulfate-K Sulfate-Mg Sulf 17.5-3.13-1.6 GM/180ML SOLN Take 1 kit by mouth once.  . [DISCONTINUED] ciprofloxacin (CIPRO) 500 MG tablet Take 1 tablet (500 mg total) by mouth 2 (two) times daily.  . [DISCONTINUED] HYDROcodone-acetaminophen (NORCO) 5-325 MG tablet Take 1 tablet by mouth every 6 (six) hours as needed for severe pain.  . [DISCONTINUED] moxifloxacin (AVELOX) 400 MG tablet Take 1 tablet (400 mg total) by mouth daily at 8 pm. X 10 days  . [DISCONTINUED] ondansetron (ZOFRAN ODT) 8 MG disintegrating tablet Take 1 tablet (8 mg total) by mouth every 8 (eight) hours as needed for  nausea or vomiting.  . [DISCONTINUED] ondansetron (ZOFRAN) 4 MG tablet Take 1 tablet (4 mg total) by mouth every 6 (six) hours.  . [DISCONTINUED] oxyCODONE-acetaminophen (PERCOCET/ROXICET) 5-325 MG tablet Take 1-2 tablets by mouth every 4 (four) hours as needed for severe pain.  . [DISCONTINUED] phenazopyridine (PYRIDIUM) 200 MG tablet Take 1 tablet (200 mg total) by mouth 3 (three) times daily as needed for pain.  . [DISCONTINUED] traMADol-acetaminophen (ULTRACET) 37.5-325 MG tablet Take 1 tablet by mouth  every 6 (six) hours as needed for moderate pain.   No facility-administered encounter medications on file as of 08/13/2016.    Allergies  Allergen Reactions  . Metronidazole Other (See Comments)    Severe headaches   Patient Active Problem List   Diagnosis Date Noted  . Dysphagia 04/04/2015  . History of esophageal stricture 04/04/2015  . Severe obesity (BMI >= 40) ([place]) 04/01/2015  . ACE-inhibitor cough 03/12/2015  . Upper airway cough syndrome 03/12/2015  . Laryngopharyngeal reflux (LPR) 03/12/2015  . Elevated lipids 07/18/2014  . Chest pain 07/18/2014  . Internal hemorrhoids 04/25/2014  . Ureteral stone with hydronephrosis 04/22/2014  . Post-op pain 04/21/2014  . Rectocele, grade 3 02/20/2014  . Female rectocele without uterine prolapse 08/04/2013  . Unspecified constipation 07/23/2013  . Cystocele 06/06/2013  . Anosmia 01/25/2013  . Unspecified nutritional deficiency   . Polyneuropathy in other diseases classified elsewhere ([place])   . OSA (obstructive sleep apnea)   . Cough 07/27/2012  . Essential hypertension    Social History   Social History  . Marital status: Married    Spouse name: Herbie Baltimore  . Number of children: 3  . Years of education: 12   Occupational History  . sales    Social History Main Topics  . Smoking status: Never Smoker  . Smokeless tobacco: Never Used  . Alcohol use No  . Drug use: No  . Sexual activity: No   Other Topics Concern  . Not on file   Social History Narrative   Patient lives at home with her husband 41 years. Herbie [place]). Patient has a 12th grade education.    Right handed.    Caffeine - two daily.    Ms. [name] family history includes Colon cancer (age of onset: 61) in her father; Heart disease in her mother.      Objective:    Vitals:   08/13/16 0837  BP: 116/74  Pulse: 84    Physical Exam  well-developed older white female in no acute distress, accompanied by her small grandchild. Blood pressure 116/74 pulse 84,  height 5 foot 2, weight 236, BMI 43.1. HEENT; nontraumatic normocephalic EOMI PERRLA sclera anicteric, Cardiovascular; regular rate and rhythm with S1-S2 no murmur or gallop, Pulmonary; clear bilaterally abdomen, soft, nontender nondistended bowel sounds are active there is no palpable mass or hepatosplenomegaly, Rectal; not done, Neuropsych; mood and affect appropriate       Assessment & Plan:   #12 61 year old white female with history of distal esophageal stricture with recurrent solid food dysphagia 1 month. Patient had good response to previous dilation 1 year ago and negative manometry. Symptoms consistent with recurrent stricture #2 colon cancer screening-patient has not had prior colonoscopy # 3 family history of colon cancer in patient's father #4 history of diverticulosis #5 hypertension #6 morbid obesity BMI 43 #7 sleep apnea  Plan; Continue omeprazole 40 mg by mouth twice a day Will schedule patient for EGD with dilation with Dr. Havery Moros. We also discussed proceeding with colonoscopy and she  is agreeable at this time. Procedure discussed in detail including risks and benefits and she is agreeable to proceed. Both procedures will be scheduled on the same day. Patient cautioned to stay on very soft foods and cut her food foods into very small pieces until dilation done to prevent food impaction   Addendum; routed to Dr Fuller Plan in error,-re-routed to Dr Havery Moros Alfredia Ferguson PA-C 08/13/2016   Cc: Berkley Harvey, NP

## 2016-08-14 NOTE — Progress Notes (Signed)
Agree with assessment and plan as outlined.  

## 2016-08-26 ENCOUNTER — Ambulatory Visit (AMBULATORY_SURGERY_CENTER): Payer: Medicaid Other | Admitting: Gastroenterology

## 2016-08-26 ENCOUNTER — Encounter: Payer: Self-pay | Admitting: Gastroenterology

## 2016-08-26 VITALS — BP 155/92 | HR 70 | Temp 96.8°F | Resp 16 | Ht 62.0 in | Wt 236.0 lb

## 2016-08-26 DIAGNOSIS — K222 Esophageal obstruction: Secondary | ICD-10-CM | POA: Diagnosis not present

## 2016-08-26 DIAGNOSIS — K219 Gastro-esophageal reflux disease without esophagitis: Secondary | ICD-10-CM | POA: Diagnosis not present

## 2016-08-26 DIAGNOSIS — R1319 Other dysphagia: Secondary | ICD-10-CM

## 2016-08-26 DIAGNOSIS — Z800 Family history of malignant neoplasm of digestive organs: Secondary | ICD-10-CM | POA: Diagnosis not present

## 2016-08-26 DIAGNOSIS — K209 Esophagitis, unspecified: Secondary | ICD-10-CM

## 2016-08-26 DIAGNOSIS — D120 Benign neoplasm of cecum: Secondary | ICD-10-CM

## 2016-08-26 MED ORDER — SODIUM CHLORIDE 0.9 % IV SOLN: 500.0000 mL | INTRAVENOUS | Status: DC

## 2016-08-26 MED ORDER — RANITIDINE HCL 150 MG PO TABS: 150.0000 mg | ORAL_TABLET | Freq: Two times a day (BID) | ORAL | 3 refills | Status: DC

## 2016-08-26 NOTE — Patient Instructions (Signed)
YOU HAD AN ENDOSCOPIC PROCEDURE TODAY AT [facility]:   Refer to the procedure report that was given to you for any specific questions about what was found during the examination.  If the procedure report does not answer your questions, please call your gastroenterologist to clarify.  If you requested that your care partner not be given the details of your procedure findings, then the procedure report has been included in a sealed envelope for you to review at your convenience later.  YOU SHOULD EXPECT: Some feelings of bloating in the abdomen. Passage of more gas than usual.  Walking can help get rid of the air that was put into your GI tract during the procedure and reduce the bloating. If you had a lower endoscopy (such as a colonoscopy or flexible sigmoidoscopy) you may notice spotting of blood in your stool or on the toilet paper. If you underwent a bowel prep for your procedure, you may not have a normal bowel movement for a few days.  Please Note:  You might notice some irritation and congestion in your nose or some drainage.  This is from the oxygen used during your procedure.  There is no need for concern and it should clear up in a day or so.  SYMPTOMS TO REPORT IMMEDIATELY:   Following lower endoscopy (colonoscopy or flexible sigmoidoscopy):  Excessive amounts of blood in the stool  Significant tenderness or worsening of abdominal pains  Swelling of the abdomen that is new, acute  Fever of 100F or higher   Following upper endoscopy (EGD)  Vomiting of blood or coffee ground material  New chest pain or pain under the shoulder blades  Painful or persistently difficult swallowing  New shortness of breath  Fever of 100F or higher  Black, tarry-looking stools  For urgent or emergent issues, a gastroenterologist can be reached at any hour by calling [phone].   DIET:  Follow post dilation diet (see hand-out given to you by your recovery nurse).  Drink plenty of  fluids but you should avoid alcoholic beverages for 24 hours.  MEDICATIONS:  NO Ibuprofen, Naproxen, or other non-steroidal anti-inflammatory drugs for 2 weeks after polyp removal. Continue Omeprazole at twice daily. Add Zantac 150mg  twice daily. Prescription sent to pharmacy.  ACTIVITY:  You should plan to take it easy for the rest of today and you should NOT DRIVE or use heavy machinery until tomorrow (because of the sedation medicines used during the test).    FOLLOW UP: Our staff will call the number listed on your records the next business day following your procedure to check on you and address any questions or concerns that you may have regarding the information given to you following your procedure. If we do not reach you, we will leave a message.  However, if you are feeling well and you are not experiencing any problems, there is no need to return our call.  We will assume that you have returned to your regular daily activities without incident.  If any biopsies were taken you will be contacted by phone or by letter within the next 1-3 weeks.  Please call us at [phone] if you have not heard about the biopsies in 3 weeks.    SIGNATURES/CONFIDENTIALITY: You and/or your care partner have signed paperwork which will be entered into your electronic medical record.  These signatures attest to the fact that that the information above on your After Visit Summary has been reviewed and is understood.  Full responsibility of the confidentiality of this discharge information lies with you and/or your care-partner.

## 2016-08-26 NOTE — Progress Notes (Signed)
Spontaneous respirations throughout. VSS. Resting comfortably. To PACU on room air. Report to  Sara RN. 

## 2016-08-26 NOTE — Op Note (Signed)
[facility] Patient Name: Ann [name] [name] Procedure Date: 08/26/2016 11:02 AM MRN: [id number] Endoscopist: Remo Lipps P. Almarosa Bohac MD, MD Age: 61 Referring MD:  Date of Birth: Nov 18, 1955 Gender: Female Account #: [id number] Procedure:                Colonoscopy Indications:              Screening in patient at increased risk: Family                            history of 1st-degree relative with colorectal                            cancer before age 49 years (mother age 64s), This                            is the patient's first colonoscopy Medicines:                Monitored Anesthesia Care Procedure:                Pre-Anesthesia Assessment:                           - Prior to the procedure, a History and Physical                            was performed, and patient medications and                            allergies were reviewed. The patient's tolerance of                            previous anesthesia was also reviewed. The risks                            and benefits of the procedure and the sedation                            options and risks were discussed with the patient.                            All questions were answered, and informed consent                            was obtained. Prior Anticoagulants: The patient has                            taken no previous anticoagulant or antiplatelet                            agents. ASA Grade Assessment: II - A patient with                            mild systemic disease. After reviewing the risks  and benefits, the patient was deemed in                            satisfactory condition to undergo the procedure.                           After obtaining informed consent, the colonoscope                            was passed under direct vision. Throughout the                            procedure, the patient's blood pressure, pulse, and                            oxygen saturations were  monitored continuously. The                            Model CF-HQ190L [id number]) scope was introduced                            through the anus and advanced to the the cecum,                            identified by appendiceal orifice and ileocecal                            valve. The colonoscopy was performed without                            difficulty. The patient tolerated the procedure                            well. The quality of the bowel preparation was                            good. The ileocecal valve, appendiceal orifice, and                            rectum were photographed. Scope In: 11:23:32 AM Scope Out: 11:46:03 AM Scope Withdrawal Time: 0 hours 20 minutes 5 seconds  Total Procedure Duration: 0 hours 22 minutes 31 seconds  Findings:                 The perianal and digital rectal examinations were                            normal.                           A 4 mm polyp was found in the cecum. The polyp was                            sessile. The polyp was removed with a cold snare.  Resection and retrieval were complete.                           Scattered medium-mouthed diverticula were found in                            the transverse colon and left colon.                           There was significant spasm in the transverse colon                            and in the ascending colon. The recto-sigmoid and                            sigmoid was extremely redundant and difficult to                            insufflate.                           Internal hemorrhoids were found. The hemorrhoids                            were large.                           The exam was otherwise without abnormality. Complications:            No immediate complications. Estimated blood loss:                            Minimal. Estimated Blood Loss:     Estimated blood loss was minimal. Impression:               - One 4 mm polyp in the cecum,  removed with a cold                            snare. Resected and retrieved.                           - Diverticulosis in the transverse colon and in the                            left colon.                           - Significant colonic spasm.                           - Redundant left colon.                           - Internal hemorrhoids.                           - The examination was otherwise normal. Recommendation:           -  Patient has a contact number available for                            emergencies. The signs and symptoms of potential                            delayed complications were discussed with the                            patient. Return to normal activities tomorrow.                            Written discharge instructions were provided to the                            patient.                           - Resume previous diet.                           - Continue present medications.                           - No ibuprofen, naproxen, or other non-steroidal                            anti-inflammatory drugs for 2 weeks after polyp                            removal.                           - Await pathology results.                           - Repeat colonoscopy is recommended for                            surveillance in 5 years Ann [name] [name]. Ann [name] Sorci MD, MD 08/26/2016 11:53:28 AM This report has been signed electronically.

## 2016-08-26 NOTE — Op Note (Signed)
[facility] Patient Name: Ann [name] Procedure Date: 08/26/2016 11:02 AM MRN: [id number] Endoscopist: Remo Lipps P. Armbruster MD, MD Age: 61 Referring MD:  Date of Birth: 1955/11/05 Gender: Female Account #: [id number] Procedure:                Upper GI endoscopy Indications:              Dysphagia, history of esophageal stricture Medicines:                Monitored Anesthesia Care Procedure:                Pre-Anesthesia Assessment:                           - Prior to the procedure, a History and Physical                            was performed, and patient medications and                            allergies were reviewed. The patient's tolerance of                            previous anesthesia was also reviewed. The risks                            and benefits of the procedure and the sedation                            options and risks were discussed with the patient.                            All questions were answered, and informed consent                            was obtained. Prior Anticoagulants: The patient has                            taken no previous anticoagulant or antiplatelet                            agents. ASA Grade Assessment: II - A patient with                            mild systemic disease. After reviewing the risks                            and benefits, the patient was deemed in                            satisfactory condition to undergo the procedure.                           After obtaining informed consent, the endoscope was  passed under direct vision. Throughout the                            procedure, the patient's blood pressure, pulse, and                            oxygen saturations were monitored continuously. The                            Model GIF-HQ190 [id number]) scope was introduced                            through the mouth, and advanced to the second part                            of  duodenum. The upper GI endoscopy was                            accomplished without difficulty. The patient                            tolerated the procedure well. Scope In: Scope Out: Findings:                 Esophagogastric landmarks were identified: the                            Z-line was found at 35 cm, the gastroesophageal                            junction was found at 35 cm and the upper extent of                            the gastric folds was found at 38 cm from the                            incisors.                           A 3 cm hiatal hernia was present.                           One moderate benign-appearing, intrinsic stenosis                            was found. This measured less than one cm (in                            length) and was traversed. A TTS dilator was passed                            through the scope. Dilation with a 16-17-18 mm  balloon dilator was performed to 33mm, 73mm, and                            then 18 mm. Biopsies were taken with a cold forceps                            for histology to open the stricture further                           Mild esophagitis was found at the GEJ.                           The exam of the esophagus was otherwise normal.                           The entire examined stomach was normal.                           The duodenal bulb and second portion of the                            duodenum were normal. Complications:            No immediate complications. Estimated blood loss:                            Minimal. Estimated Blood Loss:     Estimated blood loss was minimal. Impression:               - Esophagogastric landmarks identified.                           - 3 cm hiatal hernia.                           - Benign-appearing esophageal stenosis. Dilated to                            95mm with good result. Biopsied.                           - Mild esophagitis.                            - Normal stomach.                           - Normal duodenal bulb and second portion of the                            duodenum. Recommendation:           - Patient has a contact number available for                            emergencies. The signs and symptoms of potential  delayed complications were discussed with the                            patient. Return to normal activities tomorrow.                            Written discharge instructions were provided to the                            patient.                           - Resume previous diet.                           - Continue present medications.                           - Increase omeprazole to twice daily                           - No ibuprofen, naproxen, or other non-steroidal                            anti-inflammatory drugs for 2 weeks.                           - Await pathology results.                           - Repeat upper endoscopy PRN for retreatment. Remo Lipps P. Armbruster MD, MD 08/26/2016 12:01:50 PM This report has been signed electronically.

## 2016-08-27 ENCOUNTER — Telehealth: Payer: Self-pay

## 2016-08-27 NOTE — Telephone Encounter (Signed)
  Follow up Call-  Call back number 08/26/2016 08/22/2015 07/30/2015 04/05/2015 06/07/2014  Post procedure Call Back phone  # [phone]  Permission to leave phone message Yes Yes Yes Yes Yes  Some recent data might be hidden     Patient questions:  Do you have a fever, pain , or abdominal swelling? No. Pain Score  0 *  Have you tolerated food without any problems? Yes.    Have you been able to return to your normal activities? Yes.    Do you have any questions about your discharge instructions: Diet   No. Medications  No. Follow up visit  No.  Do you have questions or concerns about your Care? Yes.  Pt. Reports some swelling at her jaw line and below her teeth.   Pt. Denies difficulty swallowing.   Told pt. To use current condition as her frame of reference, and that the swelling should improve.   Told pt. To call back for any further questions or concerns.  Actions: * If pain score is 4 or above: No action needed, pain <4.

## 2016-08-29 ENCOUNTER — Encounter: Payer: Self-pay | Admitting: Gastroenterology

## 2016-09-01 ENCOUNTER — Other Ambulatory Visit: Payer: Self-pay

## 2016-09-01 ENCOUNTER — Telehealth: Payer: Self-pay | Admitting: Gastroenterology

## 2016-09-01 MED ORDER — DICYCLOMINE HCL 10 MG PO CAPS: 10.0000 mg | ORAL_CAPSULE | Freq: Three times a day (TID) | ORAL | 0 refills | Status: DC | PRN

## 2016-09-01 NOTE — Telephone Encounter (Signed)
If she's been having chills since before the procedure it's not related to the procedure. She had an esophageal dilation for peptic stricture and had one very small polyp removed from the cecum. She had large hemorrhoids noted on colonoscopy, I suspect this is causing her bleeding symptoms, perhaps inflamed due to the bowel preparation. I did not see any cause for her pain on the colonoscopy. I hope the dilation has provided benefit for her dysphagia.  The Augmentin would cover any diverticulitis if she is already on this for her sinus, but often the drug has many side effect including GI upset and diarrhea, this perhaps is making her feel worse in regards to some of her symptoms. She should take a daily fiber supplement for hemorrhoids and she can try either hydrocortizone cream on glycerin suppositories to decrease inflammation or try Anusol (the latter will be much more expensive. I can perform hemorrhoid banding alternatively if she is interested in this.   In regards to her pain, did she have this before the procedure? The indication for her exam was screening purposes. Thanks

## 2016-09-01 NOTE — Telephone Encounter (Signed)
Okay. I'm not sure if she is feeling bowel spasm or if she has diverticulitis or not. Augmentin would treat diverticulitis if she had it. We could also give her Bentyl 10mg  - 1 to 2 tabs every 8 hours as needed for spasm to see if this helps. Thanks much

## 2016-09-01 NOTE — Telephone Encounter (Signed)
Patient states that rectal bleeding is worse since the colonoscopy, only way she has relief from abdominal pain is to "stretch out on couch or bed" She states she has been having chills, sweats since before the procedure, continuing to have this, denies elevated temp, usually around 97.0 F. She is having her normal bm's, however it's mixed with mucus and blood. Has rectal bleeding even if passing gas. She was recently diagnosed with sinuitis and has been taking Augmentin 875 mg BID for a week now. Please advise.

## 2016-09-01 NOTE — Telephone Encounter (Signed)
Pain associated to what she believes is diverticulitis and what she had experienced in the past. Did not have this just before procedure.  She is advised to take daily fiber supplement, try Anusol or a hydrocortisone cream to help with inflammation. She is scheduled for hemorrhoid banding on 3/12.

## 2016-09-01 NOTE — Telephone Encounter (Signed)
Patient would like to try Bentyl, Rx sent into her pharmacy.

## 2016-09-15 ENCOUNTER — Encounter: Payer: Medicaid Other | Admitting: Gastroenterology

## 2016-09-16 ENCOUNTER — Other Ambulatory Visit: Payer: Self-pay | Admitting: Gastroenterology

## 2016-09-16 DIAGNOSIS — K219 Gastro-esophageal reflux disease without esophagitis: Secondary | ICD-10-CM

## 2016-09-29 ENCOUNTER — Telehealth: Payer: Self-pay

## 2016-09-29 NOTE — Telephone Encounter (Signed)
Patient rescheduled to first available which is 4/9. Patient notified.

## 2016-09-30 ENCOUNTER — Encounter: Payer: Medicaid Other | Admitting: Gastroenterology

## 2016-10-13 ENCOUNTER — Encounter: Payer: Medicaid Other | Admitting: Gastroenterology

## 2016-10-13 ENCOUNTER — Telehealth: Payer: Self-pay | Admitting: Gastroenterology

## 2016-10-27 ENCOUNTER — Ambulatory Visit (INDEPENDENT_AMBULATORY_CARE_PROVIDER_SITE_OTHER): Payer: Medicaid Other | Admitting: Gastroenterology

## 2016-10-27 ENCOUNTER — Encounter: Payer: Self-pay | Admitting: Gastroenterology

## 2016-10-27 VITALS — BP 142/90 | HR 88 | Resp 20 | Ht 62.0 in | Wt 240.0 lb

## 2016-10-27 DIAGNOSIS — K641 Second degree hemorrhoids: Secondary | ICD-10-CM

## 2016-10-27 NOTE — Patient Instructions (Addendum)
If you are age 59 or older, your body mass index should be between 23-30. Your Body mass index is 43.9 kg/m. If this is out of the aforementioned range listed, please consider follow up with your Primary Care Provider.  If you are age 81 or younger, your body mass index should be between 19-25. Your Body mass index is 43.9 kg/m. If this is out of the aformentioned range listed, please consider follow up with your Primary Care Provider.   HEMORRHOID BANDING PROCEDURE    FOLLOW-UP CARE   1. The procedure you have had should have been relatively painless since the banding of the area involved does not have nerve endings and there is no pain sensation.  The rubber band cuts off the blood supply to the hemorrhoid and the band may fall off as soon as 48 hours after the banding (the band may occasionally be seen in the toilet bowl following a bowel movement). You may notice a temporary feeling of fullness in the rectum which should respond adequately to plain Tylenol or Motrin.  2. Following the banding, avoid strenuous exercise that evening and resume full activity the next day.  A sitz bath (soaking in a warm tub) or bidet is soothing, and can be useful for cleansing the area after bowel movements.     3. To avoid constipation, take two tablespoons of natural wheat bran, natural oat bran, flax, Benefiber or any over the counter fiber supplement and increase your water intake to 7-8 glasses daily.    4. Unless you have been prescribed anorectal medication, do not put anything inside your rectum for two weeks: No suppositories, enemas, fingers, etc.  5. Occasionally, you may have more bleeding than usual after the banding procedure.  This is often from the untreated hemorrhoids rather than the treated one.  Don't be concerned if there is a tablespoon or so of blood.  If there is more blood than this, lie flat with your bottom higher than your head and apply an ice pack to the area. If the bleeding  does not stop within a half an hour or if you feel faint, call our office at [phone] or go to the emergency room.  6. Problems are not common; however, if there is a substantial amount of bleeding, severe pain, chills, fever or difficulty passing urine (very rare) or other problems, you should call us at [phone] or report to the nearest emergency room.  7. Do not stay seated continuously for more than 2-3 hours for a day or two after the procedure.  Tighten your buttock muscles 10-15 times every two hours and take 10-15 deep breaths every 1-2 hours.  Do not spend more than a few minutes on the toilet if you cannot empty your bowel; instead re-visit the toilet at a later time.    Please follow up with Dr. Havery Moros on May 14th at 11:00am  Thank you.

## 2016-10-27 NOTE — Progress Notes (Signed)
PROCEDURE NOTE: The patient presents with symptomatic grade II  hemorrhoids, requesting rubber band ligation of his/her hemorrhoidal disease.  All risks, benefits and alternative forms of therapy were described and informed consent was obtained.  In the Left Lateral Decubitus position anoscopic examination revealed grade II hemorrhoids in all positions, largest in RA position.  The anorectum was pre-medicated with 0.125% The decision was made to band the RA internal hemorrhoid, and the [place] was used to perform band ligation without complication.  Digital anorectal examination was then performed to assure proper positioning of the band, and to adjust the banded tissue as required.  The patient was discharged home without pain or other issues.  Dietary and behavioral recommendations were given and along with follow-up instructions.     The following adjunctive treatments were recommended: Daily fiber supplement  The patient will return in 2-4 weeks for follow-up and possible additional banding as required. No complications were encountered and the patient tolerated the procedure well.  [place] Cellar, MD [facility] Gastroenterology Pager [phone]

## 2016-11-17 ENCOUNTER — Encounter: Payer: Self-pay | Admitting: Gastroenterology

## 2016-11-17 ENCOUNTER — Ambulatory Visit (INDEPENDENT_AMBULATORY_CARE_PROVIDER_SITE_OTHER): Payer: Medicaid Other | Admitting: Gastroenterology

## 2016-11-17 VITALS — BP 140/86 | HR 80 | Ht 63.0 in | Wt 239.1 lb

## 2016-11-17 DIAGNOSIS — K641 Second degree hemorrhoids: Secondary | ICD-10-CM

## 2016-11-17 NOTE — Patient Instructions (Addendum)
If you are age 59 or older, your body mass index should be between 23-30. Your Body mass index is 42.36 kg/m. If this is out of the aforementioned range listed, please consider follow up with your Primary Care Provider.  If you are age 52 or younger, your body mass index should be between 19-25. Your Body mass index is 42.36 kg/m. If this is out of the aformentioned range listed, please consider follow up with your Primary Care Provider.   HEMORRHOID BANDING PROCEDURE    FOLLOW-UP CARE   1. The procedure you have had should have been relatively painless since the banding of the area involved does not have nerve endings and there is no pain sensation.  The rubber band cuts off the blood supply to the hemorrhoid and the band may fall off as soon as 48 hours after the banding (the band may occasionally be seen in the toilet bowl following a bowel movement). You may notice a temporary feeling of fullness in the rectum which should respond adequately to plain Tylenol or Motrin.  2. Following the banding, avoid strenuous exercise that evening and resume full activity the next day.  A sitz bath (soaking in a warm tub) or bidet is soothing, and can be useful for cleansing the area after bowel movements.     3. To avoid constipation, take two tablespoons of natural wheat bran, natural oat bran, flax, Benefiber or any over the counter fiber supplement and increase your water intake to 7-8 glasses daily.    4. Unless you have been prescribed anorectal medication, do not put anything inside your rectum for two weeks: No suppositories, enemas, fingers, etc.  5. Occasionally, you may have more bleeding than usual after the banding procedure.  This is often from the untreated hemorrhoids rather than the treated one.  Don't be concerned if there is a tablespoon or so of blood.  If there is more blood than this, lie flat with your bottom higher than your head and apply an ice pack to the area. If the bleeding  does not stop within a half an hour or if you feel faint, call our office at [phone] or go to the emergency room.  6. Problems are not common; however, if there is a substantial amount of bleeding, severe pain, chills, fever or difficulty passing urine (very rare) or other problems, you should call us at [phone] or report to the nearest emergency room.  7. Do not stay seated continuously for more than 2-3 hours for a day or two after the procedure.  Tighten your buttock muscles 10-15 times every two hours and take 10-15 deep breaths every 1-2 hours.  Do not spend more than a few minutes on the toilet if you cannot empty your bowel; instead re-visit the toilet at a later time.    Please follow up with Dr. Havery Moros on June 19th at 3:35pm

## 2016-11-17 NOTE — Progress Notes (Signed)
Symptoms of bleeding / irritation / leakage, significantly improved since last visit after banding of RA hemorrhoid  PROCEDURE NOTE: The patient presents with symptomatic grade II  hemorrhoids, requesting rubber band ligation of his/her hemorrhoidal disease.  All risks, benefits and alternative forms of therapy were described and informed consent was obtained.  The anorectum was pre-medicated with 0.125% nitroglycerin The decision was made to band the RP internal hemorrhoid, and the [place] was used to perform band ligation without complication.  Digital anorectal examination was then performed to assure proper positioning of the band, and to adjust the banded tissue as required.  The patient was discharged home without pain or other issues.  Dietary and behavioral recommendations were given and along with follow-up instructions.     The following adjunctive treatments were recommended: Daily fiber supplement  The patient will return in 2-4 weeks for  follow-up and possible additional banding as required. No complications were encountered and the patient tolerated the procedure well.  [place] Cellar, MD [facility] Gastroenterology Pager [phone]

## 2016-12-02 ENCOUNTER — Other Ambulatory Visit: Payer: Self-pay | Admitting: Nurse Practitioner

## 2016-12-02 ENCOUNTER — Ambulatory Visit
Admission: RE | Admit: 2016-12-02 | Discharge: 2016-12-02 | Disposition: A | Discharge status: Home / Self Care | Payer: Medicaid Other | Source: Ambulatory Visit | Attending: Nurse Practitioner | Admitting: Nurse Practitioner

## 2016-12-02 DIAGNOSIS — R109 Unspecified abdominal pain: Secondary | ICD-10-CM

## 2016-12-23 ENCOUNTER — Encounter: Payer: Medicaid Other | Admitting: Gastroenterology

## 2017-01-11 ENCOUNTER — Inpatient Hospital Stay (HOSPITAL_COMMUNITY)
Admission: EM | Admit: 2017-01-11 | Discharge: 2017-01-16 | DRG: 205 | Disposition: A | Discharge status: Home Health | Payer: Medicaid Other | Attending: Internal Medicine | Admitting: Internal Medicine

## 2017-01-11 ENCOUNTER — Inpatient Hospital Stay (HOSPITAL_COMMUNITY): Payer: Medicaid Other

## 2017-01-11 ENCOUNTER — Emergency Department (HOSPITAL_COMMUNITY): Payer: Medicaid Other

## 2017-01-11 ENCOUNTER — Encounter (HOSPITAL_COMMUNITY): Payer: Self-pay

## 2017-01-11 DIAGNOSIS — G8929 Other chronic pain: Secondary | ICD-10-CM | POA: Diagnosis present

## 2017-01-11 DIAGNOSIS — E662 Morbid (severe) obesity with alveolar hypoventilation: Principal | ICD-10-CM | POA: Diagnosis present

## 2017-01-11 DIAGNOSIS — M25569 Pain in unspecified knee: Secondary | ICD-10-CM | POA: Diagnosis present

## 2017-01-11 DIAGNOSIS — G4733 Obstructive sleep apnea (adult) (pediatric): Secondary | ICD-10-CM | POA: Diagnosis not present

## 2017-01-11 DIAGNOSIS — R0600 Dyspnea, unspecified: Secondary | ICD-10-CM | POA: Diagnosis not present

## 2017-01-11 DIAGNOSIS — K8020 Calculus of gallbladder without cholecystitis without obstruction: Secondary | ICD-10-CM | POA: Diagnosis not present

## 2017-01-11 DIAGNOSIS — Z87440 Personal history of urinary (tract) infections: Secondary | ICD-10-CM | POA: Diagnosis not present

## 2017-01-11 DIAGNOSIS — Z8673 Personal history of transient ischemic attack (TIA), and cerebral infarction without residual deficits: Secondary | ICD-10-CM | POA: Diagnosis not present

## 2017-01-11 DIAGNOSIS — R1011 Right upper quadrant pain: Secondary | ICD-10-CM

## 2017-01-11 DIAGNOSIS — J9622 Acute and chronic respiratory failure with hypercapnia: Secondary | ICD-10-CM | POA: Diagnosis present

## 2017-01-11 DIAGNOSIS — Z8249 Family history of ischemic heart disease and other diseases of the circulatory system: Secondary | ICD-10-CM

## 2017-01-11 DIAGNOSIS — F329 Major depressive disorder, single episode, unspecified: Secondary | ICD-10-CM | POA: Diagnosis present

## 2017-01-11 DIAGNOSIS — G43909 Migraine, unspecified, not intractable, without status migrainosus: Secondary | ICD-10-CM | POA: Diagnosis present

## 2017-01-11 DIAGNOSIS — J398 Other specified diseases of upper respiratory tract: Secondary | ICD-10-CM | POA: Diagnosis present

## 2017-01-11 DIAGNOSIS — Z87442 Personal history of urinary calculi: Secondary | ICD-10-CM | POA: Diagnosis not present

## 2017-01-11 DIAGNOSIS — Z800 Family history of malignant neoplasm of digestive organs: Secondary | ICD-10-CM | POA: Diagnosis not present

## 2017-01-11 DIAGNOSIS — Z79899 Other long term (current) drug therapy: Secondary | ICD-10-CM

## 2017-01-11 DIAGNOSIS — J9621 Acute and chronic respiratory failure with hypoxia: Secondary | ICD-10-CM | POA: Diagnosis present

## 2017-01-11 DIAGNOSIS — Z9119 Patient's noncompliance with other medical treatment and regimen: Secondary | ICD-10-CM

## 2017-01-11 DIAGNOSIS — K921 Melena: Secondary | ICD-10-CM | POA: Diagnosis present

## 2017-01-11 DIAGNOSIS — E872 Acidosis: Secondary | ICD-10-CM | POA: Diagnosis present

## 2017-01-11 DIAGNOSIS — Z6841 Body Mass Index (BMI) 40.0 and over, adult: Secondary | ICD-10-CM | POA: Diagnosis not present

## 2017-01-11 DIAGNOSIS — R0902 Hypoxemia: Secondary | ICD-10-CM | POA: Diagnosis present

## 2017-01-11 DIAGNOSIS — J209 Acute bronchitis, unspecified: Secondary | ICD-10-CM | POA: Diagnosis present

## 2017-01-11 DIAGNOSIS — J310 Chronic rhinitis: Secondary | ICD-10-CM | POA: Diagnosis present

## 2017-01-11 DIAGNOSIS — K219 Gastro-esophageal reflux disease without esophagitis: Secondary | ICD-10-CM | POA: Diagnosis present

## 2017-01-11 DIAGNOSIS — I10 Essential (primary) hypertension: Secondary | ICD-10-CM | POA: Diagnosis present

## 2017-01-11 DIAGNOSIS — R931 Abnormal findings on diagnostic imaging of heart and coronary circulation: Secondary | ICD-10-CM | POA: Diagnosis not present

## 2017-01-11 DIAGNOSIS — R079 Chest pain, unspecified: Secondary | ICD-10-CM | POA: Diagnosis not present

## 2017-01-11 DIAGNOSIS — E785 Hyperlipidemia, unspecified: Secondary | ICD-10-CM | POA: Diagnosis present

## 2017-01-11 DIAGNOSIS — R0609 Other forms of dyspnea: Secondary | ICD-10-CM

## 2017-01-11 DIAGNOSIS — R6889 Other general symptoms and signs: Secondary | ICD-10-CM | POA: Diagnosis not present

## 2017-01-11 DIAGNOSIS — K5900 Constipation, unspecified: Secondary | ICD-10-CM | POA: Diagnosis present

## 2017-01-11 DIAGNOSIS — R52 Pain, unspecified: Secondary | ICD-10-CM

## 2017-01-11 DIAGNOSIS — F419 Anxiety disorder, unspecified: Secondary | ICD-10-CM | POA: Diagnosis present

## 2017-01-11 LAB — URINALYSIS, ROUTINE W REFLEX MICROSCOPIC
Bilirubin Urine: NEGATIVE
GLUCOSE, UA: NEGATIVE mg/dL
Hgb urine dipstick: NEGATIVE
Ketones, ur: NEGATIVE mg/dL
LEUKOCYTES UA: NEGATIVE
Nitrite: NEGATIVE
PH: 6 (ref 5.0–8.0)
Protein, ur: NEGATIVE mg/dL
Specific Gravity, Urine: 1.025 (ref 1.005–1.030)

## 2017-01-11 LAB — COMPREHENSIVE METABOLIC PANEL
ALBUMIN: 3.4 g/dL — AB (ref 3.5–5.0)
ALT: 19 U/L (ref 14–54)
AST: 22 U/L (ref 15–41)
Alkaline Phosphatase: 74 U/L (ref 38–126)
Anion gap: 8 (ref 5–15)
BUN: 14 mg/dL (ref 6–20)
CHLORIDE: 107 mmol/L (ref 101–111)
CO2: 24 mmol/L (ref 22–32)
Calcium: 8.7 mg/dL — ABNORMAL LOW (ref 8.9–10.3)
Creatinine, Ser: 1.08 mg/dL — ABNORMAL HIGH (ref 0.44–1.00)
GFR calc Af Amer: >60 mL/min (ref >60)
GFR, EST NON AFRICAN AMERICAN: 55 mL/min — AB (ref >60)
Glucose, Bld: 131 mg/dL — ABNORMAL HIGH (ref 65–99)
POTASSIUM: 4 mmol/L (ref 3.5–5.1)
SODIUM: 139 mmol/L (ref 135–145)
Total Bilirubin: 1 mg/dL (ref 0.3–1.2)
Total Protein: 7 g/dL (ref 6.5–8.1)

## 2017-01-11 LAB — LIPASE, BLOOD: LIPASE: 23 U/L (ref 11–51)

## 2017-01-11 LAB — BRAIN NATRIURETIC PEPTIDE: B NATRIURETIC PEPTIDE 5: 10.4 pg/mL (ref 0.0–100.0)

## 2017-01-11 LAB — TSH: TSH: 1.995 u[IU]/mL (ref 0.350–4.500)

## 2017-01-11 LAB — CBC
HEMATOCRIT: 42 % (ref 36.0–46.0)
Hemoglobin: 13.7 g/dL (ref 12.0–15.0)
MCH: 32.1 pg (ref 26.0–34.0)
MCHC: 32.6 g/dL (ref 30.0–36.0)
MCV: 98.4 fL (ref 78.0–100.0)
Platelets: 270 10*3/uL (ref 150–400)
RBC: 4.27 MIL/uL (ref 3.87–5.11)
RDW: 14.5 % (ref 11.5–15.5)
WBC: 9.8 10*3/uL (ref 4.0–10.5)

## 2017-01-11 LAB — I-STAT TROPONIN, ED: Troponin i, poc: 0 ng/mL (ref 0.00–0.08)

## 2017-01-11 LAB — TROPONIN I

## 2017-01-11 MED ORDER — AMLODIPINE BESYLATE 10 MG PO TABS
10.0000 mg | ORAL_TABLET | Freq: Every day | ORAL | Status: DC
  Administered 2017-01-11 – 2017-01-16 (×4): 10 mg via ORAL
  Filled 2017-01-11 (×6): qty 1

## 2017-01-11 MED ORDER — PANTOPRAZOLE SODIUM 40 MG PO TBEC
40.0000 mg | DELAYED_RELEASE_TABLET | Freq: Every day | ORAL | Status: DC
  Administered 2017-01-11 – 2017-01-16 (×6): 40 mg via ORAL
  Filled 2017-01-11 (×6): qty 1

## 2017-01-11 MED ORDER — ADULT MULTIVITAMIN W/MINERALS CH
1.0000 | ORAL_TABLET | Freq: Every day | ORAL | Status: DC
  Administered 2017-01-11 – 2017-01-16 (×6): 1 via ORAL
  Filled 2017-01-11 (×6): qty 1

## 2017-01-11 MED ORDER — HYDROMORPHONE HCL 1 MG/ML IJ SOLN
1.0000 mg | INTRAMUSCULAR | Status: DC | PRN
  Administered 2017-01-11 – 2017-01-12 (×2): 1 mg via INTRAVENOUS
  Filled 2017-01-11 (×2): qty 1

## 2017-01-11 MED ORDER — ONDANSETRON 4 MG PO TBDP
4.0000 mg | ORAL_TABLET | Freq: Once | ORAL | Status: AC
  Administered 2017-01-11: 4 mg via ORAL
  Filled 2017-01-11: qty 1

## 2017-01-11 MED ORDER — OXYCODONE HCL 5 MG PO TABS
5.0000 mg | ORAL_TABLET | ORAL | Status: DC | PRN
  Administered 2017-01-12 – 2017-01-16 (×6): 5 mg via ORAL
  Filled 2017-01-11 (×7): qty 1

## 2017-01-11 MED ORDER — OXYCODONE-ACETAMINOPHEN 5-325 MG PO TABS
1.0000 | ORAL_TABLET | Freq: Once | ORAL | Status: AC
  Administered 2017-01-11: 1 via ORAL
  Filled 2017-01-11: qty 1

## 2017-01-11 MED ORDER — TOPIRAMATE 25 MG PO TABS
50.0000 mg | ORAL_TABLET | Freq: Two times a day (BID) | ORAL | Status: DC
  Administered 2017-01-11 – 2017-01-16 (×10): 50 mg via ORAL
  Filled 2017-01-11 (×11): qty 2

## 2017-01-11 MED ORDER — OXYCODONE-ACETAMINOPHEN 5-325 MG PO TABS
1.0000 | ORAL_TABLET | ORAL | Status: DC | PRN
  Administered 2017-01-12: 1 via ORAL
  Administered 2017-01-12: 2 via ORAL
  Filled 2017-01-11 (×2): qty 2

## 2017-01-11 MED ORDER — METOPROLOL SUCCINATE ER 50 MG PO TB24
50.0000 mg | ORAL_TABLET | Freq: Every day | ORAL | Status: DC
  Administered 2017-01-11 – 2017-01-15 (×5): 50 mg via ORAL
  Filled 2017-01-11 (×5): qty 1

## 2017-01-11 MED ORDER — DIPHENHYDRAMINE HCL 25 MG PO CAPS
25.0000 mg | ORAL_CAPSULE | Freq: Every day | ORAL | Status: DC
  Administered 2017-01-11: 25 mg via ORAL
  Filled 2017-01-11: qty 1

## 2017-01-11 MED ORDER — PROMETHAZINE HCL 25 MG/ML IJ SOLN
12.5000 mg | Freq: Four times a day (QID) | INTRAMUSCULAR | Status: DC | PRN
  Administered 2017-01-11 – 2017-01-12 (×2): 12.5 mg via INTRAVENOUS
  Filled 2017-01-11 (×2): qty 1

## 2017-01-11 MED ORDER — FUROSEMIDE 40 MG PO TABS
40.0000 mg | ORAL_TABLET | Freq: Every day | ORAL | Status: DC
  Administered 2017-01-12: 40 mg via ORAL
  Filled 2017-01-11: qty 1

## 2017-01-11 MED ORDER — ENOXAPARIN SODIUM 40 MG/0.4ML ~~LOC~~ SOLN: 40.0000 mg | SUBCUTANEOUS | Status: DC

## 2017-01-11 MED ORDER — DIPHENHYDRAMINE HCL 25 MG PO TABS
25.0000 mg | ORAL_TABLET | Freq: Every day | ORAL | Status: DC
  Filled 2017-01-11: qty 1

## 2017-01-11 MED ORDER — IOPAMIDOL (ISOVUE-370) INJECTION 76%
INTRAVENOUS | Status: AC
  Administered 2017-01-11: 100 mL
  Filled 2017-01-11: qty 100

## 2017-01-11 MED ORDER — ALBUTEROL SULFATE (2.5 MG/3ML) 0.083% IN NEBU: 3.0000 mL | INHALATION_SOLUTION | RESPIRATORY_TRACT | Status: DC | PRN

## 2017-01-11 MED ORDER — CLONAZEPAM 0.5 MG PO TABS
0.5000 mg | ORAL_TABLET | Freq: Every day | ORAL | Status: DC
  Administered 2017-01-11 – 2017-01-15 (×5): 0.5 mg via ORAL
  Filled 2017-01-11 (×5): qty 1

## 2017-01-11 MED ORDER — FLUTICASONE PROPIONATE 50 MCG/ACT NA SUSP: 2.0000 | Freq: Every day | NASAL | Status: DC | PRN

## 2017-01-11 NOTE — H&P (Addendum)
History and Physical    Ann [name] UXN:[id number] DOB: May 15, 1956 DOA: 01/11/2017  Referring MD/NP/PA: PA Rona Ravens  PCP: Berkley Harvey, NP   Patient coming from: home  Chief Complaint: dyspnea, RUQ pain   HPI: Ann [name] is a 61 y.o. female with multiple medical conditions including anxiety, OSA on CPAP, recurrent UTI's, multiple esophageal dilations done By Dr. Olevia Perches, had apparently some adenomas removed, now presented to [facility] with main concern of 3 days duration of progressively worsening RUQ abd pain that is sharp and occasionally radiating to epigastric area, 7/10 in severity and associated with 3-4 episodes of coffee -ground emesis and dark diarrhea. In addition, she was not able to eat much and has experienced worsening migraine headaches. Per daughter at bedside, she has noted that pt had intermittent and rather significant episodes of LE swelling.  ED Course: Pt is hemodynamically stable, VS stable except hypoxia on RA 85%, requiring placement on oxygen via [place]. Blood work unremarkable except mild rise in Cr 1.08. Etiology of hypoxia unclear and TRH asked to admit for further evaluation.   Review of Systems:  Constitutional: Negative for fever, chills, diaphoresis HENT: Negative for ear pain, nosebleeds, congestion, facial swelling, rhinorrhea, neck pain, neck stiffness and ear discharge.   Eyes: Negative for pain, discharge, redness, itching and visual disturbance.  Respiratory: Negative for cough, choking, chest tightness Cardiovascular: Negative for chest pain, palpitations Gastrointestinal: Negative for abdominal distention.  Genitourinary: Negative for dysuria, urgency, frequency, hematuria, flank pain, decreased urine volume, difficulty urinating and dyspareunia.  Musculoskeletal: Negative for back pain, joint swelling, arthralgias and gait problem.  Neurological: Negative for dizziness, tremors, seizures, syncope, facial asymmetry, speech difficulty, weakness,  light-headedness, numbness and headaches.  Hematological: Negative for adenopathy. Does not bruise/bleed easily.  Psychiatric/Behavioral: Negative for hallucinations, behavioral problems, confusion, dysphoric mood, decreased concentration and agitation.   Past Medical History:  Diagnosis Date  . Allergy    seasonal  . Anemia    with past pregnancy -not recent  . Anxiety   . Arthritis   . Borderline diabetes   . Chronic cystitis   . Complication of anesthesia    02-20-2014 ([facility]) INTRA-OP RESPIRATORY FAILURE SECONDARY TO POSSIBLE MUCOUS ASPIRATION/  ALSO 06-06-2013 [facility]) POST-OP DESATURATION  EVEN USING CPAP, States some issues if Propofol given to rapidly.  . Diverticulosis of colon   . Family history of adverse reaction to anesthesia    PER PT SISTER DIED AFTER GENERAL ANESTHESIA DUE TO UNDIAGNOSED OSA  . GERD (gastroesophageal reflux disease)   . H/O hiatal hernia   . Headache(784.0)    severe headaches   . History of chronic cough    "tight sounding cough"  . History of esophageal dilatation   . History of MRSA infection    3/ 2015  AXILLARY ABSCESS  . History of recurrent UTIs   . History of TIAs NO RESIDUAL   1980;  2005;   2008 PT STATES PER CT  SCARRING RIGHT SIDE OF BRAIN  . Hyperlipidemia   . Hypertension   . Kidney disease    III  . Neuromuscular disorder ([employer])    HH  . Neuropathy    pt denies this 07-05-2015  . Nocturia   . OSA on CPAP    PER SLEEP STUDY 09-08-2012  MODERATE OSA. not used in awhile, but thinks needs to start back using due to some weight gain.  . Sleep apnea   . Stroke ([place])    TIA'S  . SUI (stress  urinary incontinence, female)     Past Surgical History:  Procedure Laterality Date  . ANTERIOR AND POSTERIOR REPAIR N/A 06/06/2013   Procedure: Anterior vaginal vault repair, Sacrospinous ligament fixation with UPHOLD lite, Kelly plication, Sacrospinous mesh fixation, Solyx transurethral sling;  Surgeon: Ailene Rud, MD;  Location:  [facility];  Service: Urology;  Laterality: N/A;  . CYSTOSCOPY W/ URETERAL STENT PLACEMENT Right 04/21/2014   Procedure: CYSTOSCOPY WITH RETROGRADE PYELOGRAM/URETERAL STENT PLACEMENT;  Surgeon: Ailene Rud, MD;  Location: [facility];  Service: Urology;  Laterality: Right;  . CYSTOSCOPY W/ URETERAL STENT PLACEMENT Right 11/21/2014   Procedure: CYSTOSCOPY WITH RETROGRADE PYELOGRAM, URETEROSCOPY WITH STONE REMOVAL, URETERAL STENT PLACEMENT;  Surgeon: Carolan Clines, MD;  Location: [facility];  Service: Urology;  Laterality: Right;  . CYSTOSCOPY W/ URETERAL STENT PLACEMENT Left 02/01/2016   Procedure: CYSTO, LEFT URETEROSCOPY, LEFT RETROGRADE AND LEFT URETERAL STENT PLACEMENT;  Surgeon: Carolan Clines, MD;  Location: [facility];  Service: Urology;  Laterality: Left;  . CYSTOSCOPY WITH RETROGRADE PYELOGRAM, URETEROSCOPY AND STENT PLACEMENT Right 06/05/2014   Procedure: CYSTOSCOPY WITH RIGHT RETROGRADE PYELOGRAM, RIGHT URETEROSCOPY, STONE EXTRACTION AND RIGHT DOUBLE J STENT PLACEMENT;  Surgeon: Ailene Rud, MD;  Location: [facility];  Service: Urology;  Laterality: Right;  . ESOPHAGEAL DILATION  X2  LAST ONE  JUNE 2014  . ESOPHAGEAL MANOMETRY N/A 09/10/2015   Procedure: ESOPHAGEAL MANOMETRY (EM);  Surgeon: Manus Gunning, MD;  Location: [facility];  Service: Gastroenterology;  Laterality: N/A;  . HOLMIUM LASER APPLICATION Right 61/60/7371   Procedure: HOLMIUM LASER OF STONE ;  Surgeon: Ailene Rud, MD;  Location: [facility];  Service: Urology;  Laterality: Right;  . KNEE ARTHROSCOPY Bilateral 2008  . MULTIPLE TOOTH EXTRACTIONS     past hx  . RECTOCELE REPAIR N/A 02/20/2014   Procedure: POSTERIOR REPAIR (RECTOCELE) WITH VAGINAL VAULT REPAIR WITH ACELL GRAFT PLACEMENT, PERINEOPLASTY, ENTEROCELE REPAIR;  Surgeon: Ailene Rud, MD;  Location: [facility];  Service: Urology;  Laterality: N/A;  . TUBAL LIGATION  1987   Social  Hx:  reports that she has never smoked. She has never used smokeless tobacco. She reports that she does not drink alcohol or use drugs.  Allergies  Allergen Reactions  . Doxycycline Diarrhea  . Metronidazole Other (See Comments)    Severe headaches    Family History  Problem Relation Age of Onset  . Colon cancer Father 56       died at 40  . Heart disease Mother   . Esophageal cancer Neg Hx   . Rectal cancer Neg Hx   . Stomach cancer Neg Hx   . Colon polyps Neg Hx     Prior to Admission medications   Medication Sig Start Date End Date Taking? Authorizing Provider  albuterol (PROAIR HFA) 108 (90 Base) MCG/ACT inhaler Inhale 2 puffs into the lungs every 4 (four) hours as needed. For cough wheezing. 08/07/15  Yes [provider]  amLODipine (NORVASC) 10 MG tablet Take 10 mg by mouth daily.    Yes [provider]  chlorpheniramine (CHLOR-TRIMETON) 4 MG tablet Take 1 tablet (4 mg total) by mouth at bedtime. 03/08/15  Yes Chesley Mires, MD  clonazePAM (KLONOPIN) 0.5 MG tablet Take 0.5 mg by mouth at bedtime.    Yes [provider]  furosemide (LASIX) 40 MG tablet Take 40 mg by mouth daily as needed for fluid.  03/06/15  Yes [provider]  losartan-hydrochlorothiazide (HYZAAR) 50-12.5 MG  tablet Take 1 tablet by mouth at bedtime. 12/16/16 12/16/17 Yes [provider]  metoprolol succinate (TOPROL-XL) 50 MG 24 hr tablet Take 50 mg by mouth daily. 08/08/15  Yes [provider]  omeprazole (PRILOSEC) 40 MG capsule TAKE 1 CAPSULE(40 MG) BY MOUTH TWICE DAILY 09/17/16  Yes Armbruster, Renelda Loma, MD  polyethylene glycol powder (GLYCOLAX/MIRALAX) powder MIX 1 TABLESPOONFUL IN LIQUID AND TK PO QD 02/26/16  Yes [provider]  topiramate (TOPAMAX) 50 MG tablet Take 50 mg by mouth 2 (two) times daily.   Yes [provider]  albuterol (PROVENTIL) (2.5 MG/3ML) 0.083% nebulizer solution Inhale 3 mLs into the lungs every 6 (six) hours as  needed. Wheezing/shortness of breath 08/07/15   [provider]  fluticasone (FLONASE) 50 MCG/ACT nasal spray Place 2 sprays into both nostrils daily. Patient taking differently: Place 2 sprays into both nostrils daily as needed for allergies.  03/08/15   Chesley Mires, MD  Multiple Vitamin (MULTIVITAMIN WITH MINERALS) TABS tablet Take 1 tablet by mouth daily.    [provider]  ranitidine (ZANTAC) 150 MG tablet Take 1 tablet (150 mg total) by mouth 2 (two) times daily. Patient not taking: Reported on 01/11/2017 08/26/16   Manus Gunning, MD    Physical Exam: Vitals:   01/11/17 1615 01/11/17 1630 01/11/17 1700 01/11/17 1730  BP: 108/61 114/61 116/60 108/64  Pulse: 74 77 67 65  Resp: (!) 23 16 17 18   Temp:      TempSrc:      SpO2: (!) 85% 98% 97% 95%  Weight:      Height:        Constitutional: NAD, calm, comfortable Vitals:   01/11/17 1615 01/11/17 1630 01/11/17 1700 01/11/17 1730  BP: 108/61 114/61 116/60 108/64  Pulse: 74 77 67 65  Resp: (!) 23 16 17 18   Temp:      TempSrc:      SpO2: (!) 85% 98% 97% 95%  Weight:      Height:       Eyes: PERRL, lids and conjunctivae normal ENMT: Mucous membranes are moist. Posterior pharynx clear of any exudate or lesions.Normal dentition.  Neck: normal, supple, no masses, no thyromegaly Respiratory: notable tachypnea with diminished air movement at bases  Cardiovascular: Regular rate and rhythm, no murmurs / rubs / gallops. +1 bilateral LE edema. 2+ pedal pulses. No carotid bruits.  Abdomen: mild RUQ aera tenderness, no masses palpated. No hepatosplenomegaly. Bowel sounds positive.  Musculoskeletal: no clubbing / cyanosis. No joint deformity upper and lower extremities. Good ROM, no contractures. Normal muscle tone.  Skin: no rashes, lesions, ulcers. No induration Neurologic: CN 2-12 grossly intact. Sensation intact, DTR normal. Strength 5/5 in all 4.  Psychiatric: Normal judgment and insight. Alert and oriented x 3.  Normal mood.   Labs on Admission: I have personally reviewed following labs and imaging studies  CBC:  Recent Labs Lab 01/11/17 1106  WBC 9.8  HGB 13.7  HCT 42.0  MCV 98.4  PLT 144   Basic Metabolic Panel:  Recent Labs Lab 01/11/17 1106  NA 139  K 4.0  CL 107  CO2 24  GLUCOSE 131*  BUN 14  CREATININE 1.08*  CALCIUM 8.7*   Liver Function Tests:  Recent Labs Lab 01/11/17 1106  AST 22  ALT 19  ALKPHOS 74  BILITOT 1.0  PROT 7.0  ALBUMIN 3.4*    Recent Labs Lab 01/11/17 1106  LIPASE 23   Urine analysis:    Component Value  Date/Time   COLORURINE YELLOW 01/11/2017 1559   APPEARANCEUR CLEAR 01/11/2017 1559   LABSPEC 1.025 01/11/2017 1559   PHURINE 6.0 01/11/2017 1559   GLUCOSEU NEGATIVE 01/11/2017 1559   HGBUR NEGATIVE 01/11/2017 1559   BILIRUBINUR NEGATIVE 01/11/2017 1559   KETONESUR NEGATIVE 01/11/2017 1559   PROTEINUR NEGATIVE 01/11/2017 1559   UROBILINOGEN 0.2 02/09/2015 1322   NITRITE NEGATIVE 01/11/2017 1559   LEUKOCYTESUR NEGATIVE 01/11/2017 1559   Radiological Exams on Admission: Dg Chest 2 View  Result Date: 01/11/2017 CLINICAL DATA:  Stabbing right chest pain for 2 weeks. Chills, cough, nausea and vomiting. EXAM: CHEST  2 VIEW COMPARISON:  08/12/2016 FINDINGS: Mild enlargement of the cardiopericardial silhouette without edema. Thoracic spondylosis. No pleural effusion. IMPRESSION: 1. Mild enlargement of the cardiopericardial silhouette, without edema. 2. Thoracic spondylosis. Electronically Signed   By: Van Clines M.D.   On: 01/11/2017 15:41    EKG: pending   Assessment/Plan Active Problems: Dyspnea and hypoxia - unclear etiology but tachypnea is definitely present on exam - no specific risks factors for PE per HPI - give LE swelling, certainly consideration to CHF should be given, pt is on lasix as needed at home, will place on lasix scheduled for next 24 hours and reassess clinical status in am to decide if lasix still needed  -  will admit pt to telemetry unit - proceed with ECHO for further evaluation - CT chest for clearer evaluation  - strict I/O, daily weights - cycle CE's - check TSH  HTN, essential - continue Norvasc and Metoprolol - hold losartan - HCTZ for now and resume once Cr back to normal limits   Coffee ground emesis, RUQ pain, bloody diarrhea - unclear etiology - physical exam fairly unremarkable and pt currently denies any pain  - LFT's stable - Hg stable - avoid blood thinners - if not better in next 24 hours, will ask for RUQ area [place] for clearer evaluation   Morbid obesity - Body mass index is 42.51 kg/m.  Hx of kidney stones - recent CT abd noted small stone on left side, nonobstructive - also mild inflammatory changes on right ureter likely from passed stone   DVT prophylaxis: SCD's Code Status: Full Family Communication: Pt and daughter updated at bedside Disposition Plan: home in few days  Consults called: None Admission status: Inpatient   Faye Ramsay MD [facility] Pager [phone]  If 7PM-7AM, please contact night-coverage [email or web address] Password [facility]  01/11/2017, 5:49 PM

## 2017-01-11 NOTE — ED Notes (Signed)
Pt sat noted as 89 with good wave forn. Placed on 2 l /[place]. Will inform pa.

## 2017-01-11 NOTE — ED Notes (Signed)
Patient transported to X-ray 

## 2017-01-11 NOTE — ED Notes (Signed)
Pt. Ambulated to restroom. Pt. Oxygen saturation 95% when starting and got as low as 91% on room air.

## 2017-01-11 NOTE — Progress Notes (Signed)
Ann [name] [id number] Admitted to 8S16: 01/11/2017 20:00 Attending Provider: Theodis Blaze, MD    Ann [name] is a 61 y.o. female patient admitted from ED awake, alert  & orientated  X 3,  Prior, VSS - Blood pressure 133/73, pulse 72, temperature 97.6 F (36.4 C), temperature source Oral, resp. rate 19, height 5\' 3"  (1.6 m), weight 108.9 kg (240 lb), SpO2 99 %., O2   2 L nasal cannular, no c/o shortness of breath, no c/o chest pain, no distress noted. Tele # 10 placed and pt is currently running: SR   IV site WDL:  with a transparent dsg that's clean dry and intact.  Allergies:   Allergies  Allergen Reactions  . Doxycycline Diarrhea  . Metronidazole Other (See Comments)    Severe headaches     Past Medical History:  Diagnosis Date  . Allergy    seasonal  . Anemia    with past pregnancy -not recent  . Anxiety   . Arthritis   . Borderline diabetes   . Chronic cystitis   . Complication of anesthesia    02-20-2014 ([facility]) INTRA-OP RESPIRATORY FAILURE SECONDARY TO POSSIBLE MUCOUS ASPIRATION/  ALSO 06-06-2013 [facility]) POST-OP DESATURATION  EVEN USING CPAP, States some issues if Propofol given to rapidly.  . Diverticulosis of colon   . Family history of adverse reaction to anesthesia    PER PT SISTER DIED AFTER GENERAL ANESTHESIA DUE TO UNDIAGNOSED OSA  . GERD (gastroesophageal reflux disease)   . H/O hiatal hernia   . Headache(784.0)    severe headaches   . History of chronic cough    "tight sounding cough"  . History of esophageal dilatation   . History of MRSA infection    3/ 2015  AXILLARY ABSCESS  . History of recurrent UTIs   . History of TIAs NO RESIDUAL   1980;  2005;   2008 PT STATES PER CT  SCARRING RIGHT SIDE OF BRAIN  . Hyperlipidemia   . Hypertension   . Kidney disease    III  . Neuromuscular disorder ([place])    HH  . Neuropathy    pt denies this 07-05-2015  . Nocturia   . OSA on CPAP    PER SLEEP STUDY 09-08-2012  MODERATE OSA. not used in awhile, but thinks  needs to start back using due to some weight gain.  . Sleep apnea   . Stroke ([place])    TIA'S  . SUI (stress urinary incontinence, female)     History:  obtained from patient/family  Pt orientation to unit, room and routine. Information packet given to patient.  Admission INP armband ID verified with patient/family, and in place. SR up x 2, fall risk assessment complete with Patient and family verbalizing understanding of risks associated with falls. Pt verbalizes an understanding of how to use the call bell and to call for help before getting out of bed.  Skin, clean-dry- intact without evidence of bruising, or skin tears.    Will cont to monitor and assist as needed.  Parthenia Ames, RN 01/11/2017 8:44 PM

## 2017-01-11 NOTE — ED Provider Notes (Signed)
[facility] DEPT Provider Note   CSN: [id number] Arrival date & time: 01/11/17  1049     History   Chief Complaint Chief Complaint  Patient presents with  . Abdominal Pain  . Emesis    HPI Ann [name] is a 61 y.o. female with a h/o of diverticultis and nephrolithiasis who presents to the emergency department with 3 days of worsening, sharp right upper quadrant and epigastric pain. She reports associated chills, 3-4 episodes of dark, coffee-ground like emesis and 3-4 episodes of dark diarrhea since yesterday. Denies bilious emesis, hematochezia, fever, dysuria, frequency, CP, dyspnea, hematuria, or vaginal pain.  The patient also reports a history of migraines and complains of increasingly frequent headaches that are intermittently retro-orbital; however, the most recent headache has been over the left temporal area.  The patient's daughter also reports 2 months of worsening bilateral extremity swelling that is worse at the end of the day and is concerned it may be related to her heart. The patient reports that she has checked her O2 saturation at home with her husband's machine and frequently finds it lower than his despite his h/o CHF.  The patient had an endoscopy and a colonoscopy performed 08/26/2016. She reports a h/o of over 20 medical procedures in the last few years including multiple EGDs with dilatations d/t an esophageal stricture and multiple cystoscopies with ureteral stent placement.  PMH also includes OSA on CPAP, anxiety, and polyneuropathy. 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   The history is provided by the patient and a relative. No language interpreter was used.    Past Medical History:  Diagnosis Date  . Allergy    seasonal  . Anemia    with past pregnancy -not recent  . Anxiety   . Arthritis   . Borderline diabetes   . Chronic cystitis   . Complication of anesthesia    02-20-2014 ([facility]) INTRA-OP RESPIRATORY FAILURE SECONDARY TO POSSIBLE  MUCOUS ASPIRATION/  ALSO 06-06-2013 [facility]) POST-OP DESATURATION  EVEN USING CPAP, States some issues if Propofol given to rapidly.  . Diverticulosis of colon   . Family history of adverse reaction to anesthesia    PER PT SISTER DIED AFTER GENERAL ANESTHESIA DUE TO UNDIAGNOSED OSA  . GERD (gastroesophageal reflux disease)   . H/O hiatal hernia   . Headache(784.0)    severe headaches   . History of chronic cough    "tight sounding cough"  . History of esophageal dilatation   . History of MRSA infection    3/ 2015  AXILLARY ABSCESS  . History of recurrent UTIs   . History of TIAs NO RESIDUAL   1980;  2005;   2008 PT STATES PER CT  SCARRING RIGHT SIDE OF BRAIN  . Hyperlipidemia   . Hypertension   . Kidney disease    III  . Neuromuscular disorder ([place])    HH  . Neuropathy    pt denies this 07-05-2015  . Nocturia   . OSA on CPAP    PER SLEEP STUDY 09-08-2012  MODERATE OSA. not used in awhile, but thinks needs to start back using due to some weight gain.  . Sleep apnea   . Stroke ([place])    TIA'S  . SUI (stress urinary incontinence, female)     Patient Active Problem List   Diagnosis Date Noted  . Hypoxia 01/11/2017  . Dyspnea 01/11/2017  . Dysphagia 04/04/2015  . History of esophageal stricture 04/04/2015  . Severe obesity (BMI >= 40) (  [place]) 04/01/2015  . ACE-inhibitor cough 03/12/2015  . Upper airway cough syndrome 03/12/2015  . Laryngopharyngeal reflux (LPR) 03/12/2015  . Elevated lipids 07/18/2014  . Chest pain 07/18/2014  . Internal hemorrhoids 04/25/2014  . Ureteral stone with hydronephrosis 04/22/2014  . Post-op pain 04/21/2014  . Rectocele, grade 3 02/20/2014  . Female rectocele without uterine prolapse 08/04/2013  . Unspecified constipation 07/23/2013  . Cystocele 06/06/2013  . Anosmia 01/25/2013  . Unspecified nutritional deficiency   . Polyneuropathy in other diseases classified elsewhere ([place])   . OSA (obstructive sleep apnea)   . Cough 07/27/2012  .  Essential hypertension     Past Surgical History:  Procedure Laterality Date  . ANTERIOR AND POSTERIOR REPAIR N/A 06/06/2013   Procedure: Anterior vaginal vault repair, Sacrospinous ligament fixation with UPHOLD lite, Kelly plication, Sacrospinous mesh fixation, Solyx transurethral sling;  Surgeon: Ailene Rud, MD;  Location: [facility];  Service: Urology;  Laterality: N/A;  . CYSTOSCOPY W/ URETERAL STENT PLACEMENT Right 04/21/2014   Procedure: CYSTOSCOPY WITH RETROGRADE PYELOGRAM/URETERAL STENT PLACEMENT;  Surgeon: Ailene Rud, MD;  Location: [facility];  Service: Urology;  Laterality: Right;  . CYSTOSCOPY W/ URETERAL STENT PLACEMENT Right 11/21/2014   Procedure: CYSTOSCOPY WITH RETROGRADE PYELOGRAM, URETEROSCOPY WITH STONE REMOVAL, URETERAL STENT PLACEMENT;  Surgeon: Carolan Clines, MD;  Location: [facility];  Service: Urology;  Laterality: Right;  . CYSTOSCOPY W/ URETERAL STENT PLACEMENT Left 02/01/2016   Procedure: CYSTO, LEFT URETEROSCOPY, LEFT RETROGRADE AND LEFT URETERAL STENT PLACEMENT;  Surgeon: Carolan Clines, MD;  Location: [facility];  Service: Urology;  Laterality: Left;  . CYSTOSCOPY WITH RETROGRADE PYELOGRAM, URETEROSCOPY AND STENT PLACEMENT Right 06/05/2014   Procedure: CYSTOSCOPY WITH RIGHT RETROGRADE PYELOGRAM, RIGHT URETEROSCOPY, STONE EXTRACTION AND RIGHT DOUBLE J STENT PLACEMENT;  Surgeon: Ailene Rud, MD;  Location: [facility];  Service: Urology;  Laterality: Right;  . ESOPHAGEAL DILATION  X2  LAST ONE  JUNE 2014  . ESOPHAGEAL MANOMETRY N/A 09/10/2015   Procedure: ESOPHAGEAL MANOMETRY (EM);  Surgeon: Manus Gunning, MD;  Location: [facility];  Service: Gastroenterology;  Laterality: N/A;  . HOLMIUM LASER APPLICATION Right [id number]/29/9242   Procedure: HOLMIUM LASER OF STONE ;  Surgeon: Ailene Rud, MD;  Location: [facility];  Service: Urology;  Laterality: Right;  . KNEE ARTHROSCOPY Bilateral 2008   . MULTIPLE TOOTH EXTRACTIONS     past hx  . RECTOCELE REPAIR N/A 02/20/2014   Procedure: POSTERIOR REPAIR (RECTOCELE) WITH VAGINAL VAULT REPAIR WITH ACELL GRAFT PLACEMENT, PERINEOPLASTY, ENTEROCELE REPAIR;  Surgeon: Ailene Rud, MD;  Location: [facility];  Service: Urology;  Laterality: N/A;  . TUBAL LIGATION  1987    OB History    No data available       Home Medications    Prior to Admission medications   Medication Sig Start Date End Date Taking? Authorizing Provider  albuterol (PROAIR HFA) 108 (90 Base) MCG/ACT inhaler Inhale 2 puffs into the lungs every 4 (four) hours as needed. For cough wheezing. 08/07/15  Yes [provider]  amLODipine (NORVASC) 10 MG tablet Take 10 mg by mouth daily.    Yes [provider]  chlorpheniramine (CHLOR-TRIMETON) 4 MG tablet Take 1 tablet (4 mg total) by mouth at bedtime. 03/08/15  Yes Chesley Mires, MD  clonazePAM (KLONOPIN) 0.5 MG tablet Take 0.5 mg by mouth at bedtime.    Yes [provider]  furosemide (LASIX) 40 MG tablet Take 40 mg by mouth daily as needed for  fluid.  03/06/15  Yes [provider]  losartan-hydrochlorothiazide (HYZAAR) 50-12.5 MG tablet Take 1 tablet by mouth at bedtime. 12/16/16 12/16/17 Yes [provider]  metoprolol succinate (TOPROL-XL) 50 MG 24 hr tablet Take 50 mg by mouth daily. 08/08/15  Yes [provider]  omeprazole (PRILOSEC) 40 MG capsule TAKE 1 CAPSULE(40 MG) BY MOUTH TWICE DAILY 09/17/16  Yes Armbruster, Renelda Loma, MD  polyethylene glycol powder (GLYCOLAX/MIRALAX) powder MIX 1 TABLESPOONFUL IN LIQUID AND TK PO QD 02/26/16  Yes [provider]  topiramate (TOPAMAX) 50 MG tablet Take 50 mg by mouth 2 (two) times daily.   Yes [provider]  albuterol (PROVENTIL) (2.5 MG/3ML) 0.083% nebulizer solution Inhale 3 mLs into the lungs every 6 (six) hours as needed. Wheezing/shortness of breath 08/07/15   [provider]  fluticasone (FLONASE) 50  MCG/ACT nasal spray Place 2 sprays into both nostrils daily. Patient taking differently: Place 2 sprays into both nostrils daily as needed for allergies.  03/08/15   Chesley Mires, MD  Multiple Vitamin (MULTIVITAMIN WITH MINERALS) TABS tablet Take 1 tablet by mouth daily.    [provider]  ranitidine (ZANTAC) 150 MG tablet Take 1 tablet (150 mg total) by mouth 2 (two) times daily. Patient not taking: Reported on 01/11/2017 08/26/16   Armbruster, Renelda Loma, MD    Family History Family History  Problem Relation Age of Onset  . Colon cancer Father 50       died at 9  . Heart disease Mother   . Esophageal cancer Neg Hx   . Rectal cancer Neg Hx   . Stomach cancer Neg Hx   . Colon polyps Neg Hx     Social History Social History  Substance Use Topics  . Smoking status: Never Smoker  . Smokeless tobacco: Never Used  . Alcohol use No     Allergies   Doxycycline and Metronidazole   Review of Systems Review of Systems  Constitutional: Positive for chills. Negative for diaphoresis and fever.  HENT: Negative for sore throat.   Respiratory: Negative for cough and shortness of breath.   Cardiovascular: Positive for leg swelling. Negative for chest pain.  Gastrointestinal: Positive for abdominal pain, blood in stool, diarrhea, nausea and vomiting. Negative for abdominal distention.  Genitourinary: Negative for hematuria and vaginal pain.  Musculoskeletal: Negative for back pain and myalgias.  Skin: Negative for rash.  Neurological: Positive for headaches. Negative for weakness and numbness.   Physical Exam Updated Vital Signs BP 109/67 (BP Location: Left Arm)   Pulse (!) 57   Temp 97.7 F (36.5 C)   Resp 18   Ht '5\' 3"'  (1.6 m)   Wt 108.9 kg (240 lb)   SpO2 92%   BMI 42.51 kg/m   Physical Exam  Constitutional: No distress.  HENT:  Head: Normocephalic.  No trismus. No temporal thrills. Temporal artery is non-tender to palpation  Eyes: Conjunctivae are normal.    Neck: Neck supple.  Cardiovascular: Normal rate, regular rhythm, normal heart sounds and intact distal pulses.  Exam reveals no gallop and no friction rub.   No murmur heard. Pulmonary/Chest: Effort normal. No respiratory distress. She has no wheezes. She has no rales.  Abdominal: Soft. She exhibits no distension. There is tenderness. There is no rebound and no guarding.  RUQ and right flank TTP. Negative Murphy sign.   Neurological: She is alert.  Skin: Skin is warm. No rash noted.  Psychiatric: Her behavior is normal.  Nursing note and vitals  reviewed.    ED Treatments / Results  Labs (all labs ordered are listed, but only abnormal results are displayed) Labs Reviewed  COMPREHENSIVE METABOLIC PANEL - Abnormal; Notable for the following:       Result Value   Glucose, Bld 131 (*)    Creatinine, Ser 1.08 (*)    Calcium 8.7 (*)    Albumin 3.4 (*)    GFR calc non Af Amer 55 (*)    All other components within normal limits  BASIC METABOLIC PANEL - Abnormal; Notable for the following:    Creatinine, Ser 1.18 (*)    Calcium 8.7 (*)    GFR calc non Af Amer 50 (*)    GFR calc Af Amer 58 (*)    All other components within normal limits  CBC - Abnormal; Notable for the following:    MCV 101.7 (*)    All other components within normal limits  BLOOD GAS, ARTERIAL - Abnormal; Notable for the following:    pH, Arterial 7.307 (*)    pCO2 arterial 57.5 (*)    pO2, Arterial 70.5 (*)    Acid-Base Excess 2.2 (*)    All other components within normal limits  MRSA PCR SCREENING  LIPASE, BLOOD  CBC  URINALYSIS, ROUTINE W REFLEX MICROSCOPIC  BRAIN NATRIURETIC PEPTIDE  HIV ANTIBODY (ROUTINE TESTING)  TSH  TROPONIN I  TROPONIN I  TROPONIN I  I-STAT TROPOININ, ED  POC OCCULT BLOOD, ED    EKG  EKG Interpretation  Date/Time:  Sunday January 11 2017 13:11:39 EDT Ventricular Rate:  79 PR Interval:    QRS Duration: 96 QT Interval:  360 QTC Calculation: 413 R Axis:   43 Text  Interpretation:  Sinus rhythm Atrial premature complexes No significant change since last tracing Confirmed by Addison Lank [phone]) on 01/11/2017 2:23:58 PM       Radiology Dg Chest 2 View  Result Date: 01/11/2017 CLINICAL DATA:  Stabbing right chest pain for 2 weeks. Chills, cough, nausea and vomiting. EXAM: CHEST  2 VIEW COMPARISON:  08/12/2016 FINDINGS: Mild enlargement of the cardiopericardial silhouette without edema. Thoracic spondylosis. No pleural effusion. IMPRESSION: 1. Mild enlargement of the cardiopericardial silhouette, without edema. 2. Thoracic spondylosis. Electronically Signed   By: Van Clines M.D.   On: 01/11/2017 15:41   Ct Angio Chest Pe W Or Wo Contrast  Result Date: 01/11/2017 CLINICAL DATA:  61 year old female with dyspnea and hypoxia. EXAM: CT ANGIOGRAPHY CHEST WITH CONTRAST TECHNIQUE: Multidetector CT imaging of the chest was performed using the standard protocol during bolus administration of intravenous contrast. Multiplanar CT image reconstructions and MIPs were obtained to evaluate the vascular anatomy. CONTRAST:  100 cc Isovue 370 COMPARISON:  Chest radiograph dated 01/11/2017 FINDINGS: Cardiovascular: Borderline cardiac size. No pericardial effusion. The thoracic aorta is unremarkable. There is no CT evidence of pulmonary embolism. Mediastinum/Nodes: No hilar or mediastinal adenopathy. Small amount of fluid within the esophagus likely related to gastroesophageal reflux. Lungs/Pleura: The lungs are clear. There is no pleural effusion or pneumothorax. The central airways are patent. Upper Abdomen: The visualized upper abdomen is unremarkable. Musculoskeletal: Mild degenerative changes of the spine. No acute osseous pathology. Review of the MIP images confirms the above findings. IMPRESSION: No acute intrathoracic pathology. No CT evidence of pulmonary embolism. Electronically Signed   By: Anner Crete M.D.   On: 01/11/2017 23:34    Procedures Procedures  (including critical care time)   EMERGENCY DEPARTMENT BILIARY ULTRASOUND INTERPRETATION "Study: Limited Abdominal Ultrasound of the Gallbladder and  Common Bile Duct."  INDICATIONS: Abdominal pain Indication: Multiple views of the gallbladder and common bile duct were obtained in real-time with a Multi-frequency probe."  PERFORMED BY:  Myself IMAGES ARCHIVED?: Yes LIMITATIONS: Body habitus INTERPRETATION: Cholelithiasis  EMERGENCY DEPARTMENT US RENAL EXAM  "Study: Limited Retroperitoneal Ultrasound of Kidneys"  INDICATIONS: Flank pain Long and short axis of both kidneys were obtained.   PERFORMED BY: Myself IMAGES ARCHIVED?: Yes LIMITATIONS: Body habitus VIEWS USED: Long axis  INTERPRETATION: No Hydronephrosis    Medications Ordered in ED Medications  multivitamin with minerals tablet 1 tablet (1 tablet Oral Given 01/12/17 0911)  pantoprazole (PROTONIX) EC tablet 40 mg (40 mg Oral Given 01/12/17 0913)  albuterol (PROVENTIL) (2.5 MG/3ML) 0.083% nebulizer solution 3 mL (not administered)  metoprolol succinate (TOPROL-XL) 24 hr tablet 50 mg (50 mg Oral Given 01/11/17 2115)  fluticasone (FLONASE) 50 MCG/ACT nasal spray 2 spray (not administered)  amLODipine (NORVASC) tablet 10 mg (10 mg Oral Given 01/12/17 0911)  clonazePAM (KLONOPIN) tablet 0.5 mg (0.5 mg Oral Given 01/11/17 2114)  topiramate (TOPAMAX) tablet 50 mg (50 mg Oral Given 01/12/17 0911)  oxyCODONE (Oxy IR/ROXICODONE) immediate release tablet 5 mg (not administered)  furosemide (LASIX) tablet 40 mg (40 mg Oral Given 01/12/17 0911)  acetaminophen (TYLENOL) tablet 650 mg (not administered)  diphenhydrAMINE (BENADRYL) capsule 25 mg (not administered)  ondansetron (ZOFRAN) injection 4 mg (4 mg Intravenous Given 01/12/17 1257)  morphine 2 MG/ML injection 1 mg (not administered)  butalbital-acetaminophen-caffeine (FIORICET, ESGIC) 50-325-40 MG per tablet 1 tablet (not administered)  ondansetron (ZOFRAN-ODT) disintegrating tablet 4 mg (4  mg Oral Given 01/11/17 1313)  oxyCODONE-acetaminophen (PERCOCET/ROXICET) 5-325 MG per tablet 1 tablet (1 tablet Oral Given 01/11/17 1313)  ondansetron (ZOFRAN-ODT) disintegrating tablet 4 mg (4 mg Oral Given 01/11/17 1708)  iopamidol (ISOVUE-370) 76 % injection (100 mLs  Contrast Given 01/11/17 2253)  SUMAtriptan (IMITREX) injection 6 mg (6 mg Subcutaneous Given 01/12/17 0353)     Initial Impression / Assessment and Plan / ED Course  I have reviewed the triage vital signs and the nursing notes.  Pertinent labs & imaging results that were available during my care of the patient were reviewed by me and considered in my medical decision making (see chart for details).   -2:45 Patient on 3 L of O2. On RA, the patient observed by myself and Dr. Leonette Monarch to have SaO2 dropping to 84% with good wave forms on the monitor. Will order CXR, BNP, and ambulate the patient while on pulse ox     Patient presenting with multiple medical complaints. The patient was seen and evaluated by Dr. Leonette Monarch, attending physician. Afebrile. Non-tachy. On PE, RUQ tenderness without guarding and rebound noted. Patient has had 4 CT A/P performed within the last year with the most recent on 5/29.  Bedside gallbladder US demonstrating cholelithiasis without cholecystitis and renal US is negative for hydronephrosis bilaterally. Transaminases, Alk phos, and bilirubin are unremarkable. Plan to refer the patient to OP general surgery.  EKG is unchanged from previous. No leukocytosis. After performing bedside US, the patient reports her SaO2 was dropping so she was placed on O2. On RA, the patients becomes hypoxic to 84% despite having good waveforms on the monitor. Will order orthostatic vitals, CXR, BNP, and Trop to assess for other sources of hypoxia. At this time, planning to consult the hospitalist for admission for further workup of hypoxia per recommendation from Dr. Leonette Monarch.   Patient care transferred to [facility] at the end of my shift.  Patient presentation, ED course, and plan of care discussed with review of all pertinent labs and imaging. Please see his/her note for further details regarding further ED course and disposition.  Final Clinical Impressions(s) / ED Diagnoses   Final diagnoses:  Hypoxia  RUQ pain    New Prescriptions Current Discharge Medication List       Joanne Gavel, PA-C 01/12/17 1448    Fatima Blank, MD 01/15/17 0007

## 2017-01-11 NOTE — ED Provider Notes (Signed)
Received signout at beginning of shift.  Pt with RUQ pain x 3 days.  Has n/v/d.  Suspect biliary disease.  Pt has episodes of hypoxia while in the ER.  CXR and BNP ordered as well as troponin.  Pt has progressive worsening peripheral edema.    Likely need to be admitted for hypoxia with concern for CHF.  No hx of CHF.    Not concerning for PE.  Low suspicion for cholecystitis, but likely cholelithiasis.  Bedside US performed showing no obstructive gallstone.   3:47 PM On exam, patient does have some mild right upper quadrant tenderness without guarding or rebound tenderness. She does report having progressive worsening shortness of breath. Months as well as having peripheral edema. Strong family history of CHF. She is nonsmoker. She does not have any significant peripheral edema on exam. Lung sounds normal.  If BNP is normal, may consider d-dimer.  When ambulate pt's O2 remains above 90%, and she was not symptomatic.    4:25 PM Pt now desats to 84% on RA with good waveform.  She is symptomatic.  Given new oxygen requirement, plan to consult for admission. Normal BNP.  5:24 PM Appreciate consultation from [facility] Dr. Doyle Askew who agrees to admit pt to a medical floor for further evaluation of her hypoxia.      Results for orders placed or performed during the hospital encounter of 01/11/17  Lipase, blood  Result Value Ref Range   Lipase 23 11 - 51 U/L  Comprehensive metabolic panel  Result Value Ref Range   Sodium 139 135 - 145 mmol/L   Potassium 4.0 3.5 - 5.1 mmol/L   Chloride 107 101 - 111 mmol/L   CO2 24 22 - 32 mmol/L   Glucose, Bld 131 (H) 65 - 99 mg/dL   BUN 14 6 - 20 mg/dL   Creatinine, Ser 1.08 (H) 0.44 - 1.00 mg/dL   Calcium 8.7 (L) 8.9 - 10.3 mg/dL   Total Protein 7.0 6.5 - 8.1 g/dL   Albumin 3.4 (L) 3.5 - 5.0 g/dL   AST 22 15 - 41 U/L   ALT 19 14 - 54 U/L   Alkaline Phosphatase 74 38 - 126 U/L   Total Bilirubin 1.0 0.3 - 1.2 mg/dL   GFR calc non Af Amer 55 (L)  >60 mL/min   GFR calc Af Amer >60 >60 mL/min   Anion gap 8 5 - 15  CBC  Result Value Ref Range   WBC 9.8 4.0 - 10.5 K/uL   RBC 4.27 3.87 - 5.11 MIL/uL   Hemoglobin 13.7 12.0 - 15.0 g/dL   HCT 42.0 36.0 - 46.0 %   MCV 98.4 78.0 - 100.0 fL   MCH 32.1 26.0 - 34.0 pg   MCHC 32.6 30.0 - 36.0 g/dL   RDW 14.5 11.5 - 15.5 %   Platelets 270 150 - 400 K/uL  Urinalysis, Routine w reflex microscopic  Result Value Ref Range   Color, Urine YELLOW YELLOW   APPearance CLEAR CLEAR   Specific Gravity, Urine 1.025 1.005 - 1.030   pH 6.0 5.0 - 8.0   Glucose, UA NEGATIVE NEGATIVE mg/dL   Hgb urine dipstick NEGATIVE NEGATIVE   Bilirubin Urine NEGATIVE NEGATIVE   Ketones, ur NEGATIVE NEGATIVE mg/dL   Protein, ur NEGATIVE NEGATIVE mg/dL   Nitrite NEGATIVE NEGATIVE   Leukocytes, UA NEGATIVE NEGATIVE  Brain natriuretic peptide  Result Value Ref Range   B Natriuretic Peptide 10.4 0.0 - 100.0 pg/mL   Dg Chest  2 View  Result Date: 01/11/2017 CLINICAL DATA:  Stabbing right chest pain for 2 weeks. Chills, cough, nausea and vomiting. EXAM: CHEST  2 VIEW COMPARISON:  08/12/2016 FINDINGS: Mild enlargement of the cardiopericardial silhouette without edema. Thoracic spondylosis. No pleural effusion. IMPRESSION: 1. Mild enlargement of the cardiopericardial silhouette, without edema. 2. Thoracic spondylosis. Electronically Signed   By: Van Clines M.D.   On: 01/11/2017 15:41      Domenic Moras, PA-C 01/11/17 [place], MD 01/21/17 2019

## 2017-01-11 NOTE — ED Notes (Signed)
EDP at bedside with ultrasound 

## 2017-01-11 NOTE — ED Notes (Signed)
IV attempted x 2 without success. At pt request

## 2017-01-11 NOTE — ED Triage Notes (Signed)
Patient complains of right upper epigastric pain since Friday. Reports onset of vomiting that she describes as dark in color and diarrhea with same. Complains of weakness with same

## 2017-01-12 ENCOUNTER — Other Ambulatory Visit (HOSPITAL_COMMUNITY): Payer: Medicaid Other

## 2017-01-12 ENCOUNTER — Inpatient Hospital Stay (HOSPITAL_COMMUNITY): Payer: Medicaid Other

## 2017-01-12 DIAGNOSIS — R1011 Right upper quadrant pain: Secondary | ICD-10-CM

## 2017-01-12 DIAGNOSIS — R0600 Dyspnea, unspecified: Secondary | ICD-10-CM

## 2017-01-12 LAB — BLOOD GAS, ARTERIAL
Acid-Base Excess: 2.2 mmol/L — ABNORMAL HIGH (ref 0.0–2.0)
Bicarbonate: 27.9 mmol/L (ref 20.0–28.0)
Drawn by: 406621
O2 Content: 4 L/min
PATIENT TEMPERATURE: 98.6
PCO2 ART: 57.5 mmHg — AB (ref 32.0–48.0)
PH ART: 7.307 — AB (ref 7.350–7.450)
PO2 ART: 70.5 mmHg — AB (ref 83.0–108.0)

## 2017-01-12 LAB — CBC
HEMATOCRIT: 41.5 % (ref 36.0–46.0)
Hemoglobin: 13.2 g/dL (ref 12.0–15.0)
MCH: 32.4 pg (ref 26.0–34.0)
MCHC: 31.8 g/dL (ref 30.0–36.0)
MCV: 101.7 fL — ABNORMAL HIGH (ref 78.0–100.0)
PLATELETS: 257 10*3/uL (ref 150–400)
RBC: 4.08 MIL/uL (ref 3.87–5.11)
RDW: 14.9 % (ref 11.5–15.5)
WBC: 4.6 10*3/uL (ref 4.0–10.5)

## 2017-01-12 LAB — BASIC METABOLIC PANEL
Anion gap: 9 (ref 5–15)
BUN: 16 mg/dL (ref 6–20)
CHLORIDE: 104 mmol/L (ref 101–111)
CO2: 25 mmol/L (ref 22–32)
CREATININE: 1.18 mg/dL — AB (ref 0.44–1.00)
Calcium: 8.7 mg/dL — ABNORMAL LOW (ref 8.9–10.3)
GFR calc non Af Amer: 50 mL/min — ABNORMAL LOW (ref >60)
GFR, EST AFRICAN AMERICAN: 58 mL/min — AB (ref >60)
Glucose, Bld: 96 mg/dL (ref 65–99)
Potassium: 4.2 mmol/L (ref 3.5–5.1)
Sodium: 138 mmol/L (ref 135–145)

## 2017-01-12 LAB — TROPONIN I
Troponin I: <0.03 ng/mL (ref <0.03)
Troponin I: <0.03 ng/mL (ref <0.03)

## 2017-01-12 LAB — ECHOCARDIOGRAM COMPLETE
Height: 63 in
Weight: 3840 oz

## 2017-01-12 LAB — HIV ANTIBODY (ROUTINE TESTING W REFLEX): HIV Screen 4th Generation wRfx: NONREACTIVE

## 2017-01-12 LAB — MRSA PCR SCREENING: MRSA BY PCR: NEGATIVE

## 2017-01-12 MED ORDER — DIPHENHYDRAMINE HCL 25 MG PO CAPS
25.0000 mg | ORAL_CAPSULE | Freq: Three times a day (TID) | ORAL | Status: DC | PRN
  Administered 2017-01-13 – 2017-01-14 (×2): 25 mg via ORAL
  Filled 2017-01-12 (×2): qty 1

## 2017-01-12 MED ORDER — DIPHENHYDRAMINE HCL 25 MG PO CAPS
25.0000 mg | ORAL_CAPSULE | Freq: Three times a day (TID) | ORAL | Status: DC | PRN
  Administered 2017-01-12: 25 mg via ORAL
  Administered 2017-01-12: 50 mg via ORAL
  Filled 2017-01-12: qty 2
  Filled 2017-01-12: qty 1

## 2017-01-12 MED ORDER — ACETAMINOPHEN 325 MG PO TABS: 650.0000 mg | ORAL_TABLET | Freq: Four times a day (QID) | ORAL | Status: DC | PRN

## 2017-01-12 MED ORDER — ONDANSETRON HCL 4 MG/2ML IJ SOLN
4.0000 mg | Freq: Four times a day (QID) | INTRAMUSCULAR | Status: DC | PRN
  Administered 2017-01-12 – 2017-01-15 (×2): 4 mg via INTRAVENOUS
  Filled 2017-01-12 (×3): qty 2

## 2017-01-12 MED ORDER — SUMATRIPTAN SUCCINATE 6 MG/0.5ML ~~LOC~~ SOLN
6.0000 mg | Freq: Once | SUBCUTANEOUS | Status: AC
  Administered 2017-01-12: 6 mg via SUBCUTANEOUS
  Filled 2017-01-12: qty 0.5

## 2017-01-12 MED ORDER — MORPHINE SULFATE (PF) 2 MG/ML IV SOLN
1.0000 mg | INTRAVENOUS | Status: DC | PRN
  Filled 2017-01-12: qty 0.5

## 2017-01-12 MED ORDER — BUTALBITAL-APAP-CAFFEINE 50-325-40 MG PO TABS
1.0000 | ORAL_TABLET | Freq: Four times a day (QID) | ORAL | Status: DC | PRN
  Administered 2017-01-12 – 2017-01-13 (×3): 1 via ORAL
  Filled 2017-01-12 (×3): qty 1

## 2017-01-12 MED ORDER — GI COCKTAIL ~~LOC~~
30.0000 mL | Freq: Once | ORAL | Status: AC
  Administered 2017-01-12: 30 mL via ORAL
  Filled 2017-01-12: qty 30

## 2017-01-12 NOTE — Progress Notes (Signed)
PROGRESS NOTE    Ann [name]  ZOX:[id number] DOB: [birth year] DOA: 01/11/2017 PCP: Berkley Harvey, NP   Outpatient Specialists:     Brief Narrative:  Ann [name] is a 61 y.o. female with multiple medical conditions including anxiety, OSA on CPAP, recurrent UTI's, multiple esophageal dilations done By Dr. Olevia Perches, had apparently some adenomas removed, now presented to [facility] with main concern of 3 days duration of progressively worsening RUQ abd pain that is sharp and occasionally radiating to epigastric area, 7/10 in severity and associated with 3-4 episodes of coffee -ground emesis and dark diarrhea. In addition, she was not able to eat much and has experienced worsening migraine headaches. Per daughter at bedside, she has noted that pt had intermittent and rather significant episodes of LE swelling.  ED Course: Pt is hemodynamically stable, VS stable except hypoxia on RA 85%, requiring placement on oxygen via [place]. Blood work unremarkable except mild rise in Cr 1.08. Etiology of hypoxia unclear and TRH asked to admit for further evaluation.    Assessment & Plan:   Active Problems:   Hypoxia   Dyspnea   Dyspnea and hypoxia - unclear etiology but tachypnea was present on exam -5L O2 weaned to 2L (not a good pleth so not sure how accurate O2 sat is) -patient is caring for dying husband and under significant stress -CTA negative for PE and does not comment on fluid/pleural effusions - give LE swelling, certainly consideration to CHF should be given, pt is on lasix as needed at home, will place on lasix scheduled for next 24 hours and reassess clinical status in am to decide if lasix still needed  - echo pending - strict I/O, daily weights -  CE's negative - TSH normal  Untreated OSA -has not worn CPAP in months  HTN, essential - continue Norvasc and Metoprolol - hold losartan - HCTZ for now and resume once Cr back to normal limits   Coffee ground emesis, RUQ pain, bloody  diarrhea - unclear etiology - physical exam fairly unremarkable and pt currently denies any pain  - LFT's stable - Hg stable - RUQ U/S  Morbid obesity - Body mass index is 42.51 kg/m.  Hx of kidney stones - recent CT abd noted small stone on left side, nonobstructive - also mild inflammatory changes on right ureter likely from passed stone    DVT prophylaxis:  SCD's  Code Status: Full Code   Family Communication: Daughter at bedside  Disposition Plan:     Consultants:        Subjective: Short of breath when O2 weaned, headache better when gets O2  Objective: Vitals:   01/11/17 2325 01/12/17 0211 01/12/17 0457 01/12/17 0911  BP:  117/74 124/74 115/72  Pulse: 67 62 (!) 55   Resp: 18 17 18    Temp:  98 F (36.7 C) 97.9 F (36.6 C)   TempSrc:  Oral Oral   SpO2: 93% 98% 100%   Weight:      Height:       No intake or output data in the 24 hours ending 01/12/17 1159 Filed Weights   01/11/17 1059  Weight: 108.9 kg (240 lb)    Examination:  General exam: Anxious appearing Respiratory system: no wheezing, clear Cardiovascular system: S1 & S2 heard, RRR. No JVD, murmurs, rubs, gallops or clicks. No pedal edema. Gastrointestinal system: +BS, soft, RUQ tenderness Central nervous system: Alert and oriented. No focal neurological deficits. Extremities: Symmetric 5 x 5 power. Skin: No rashes,  lesions or ulcers Psychiatry: Judgement and insight appear normal    Data Reviewed: I have personally reviewed following labs and imaging studies  CBC:  Recent Labs Lab 01/11/17 1106 01/12/17 1003  WBC 9.8 4.6  HGB 13.7 13.2  HCT 42.0 41.5  MCV 98.4 101.7*  PLT 270 341   Basic Metabolic Panel:  Recent Labs Lab 01/11/17 1106 01/12/17 0853  NA 139 138  K 4.0 4.2  CL 107 104  CO2 24 25  GLUCOSE 131* 96  BUN 14 16  CREATININE 1.08* 1.18*  CALCIUM 8.7* 8.7*   GFR: Estimated Creatinine Clearance: 61.5 mL/min (A) (by C-G formula based on SCr of  1.18 mg/dL (H)). Liver Function Tests:  Recent Labs Lab 01/11/17 1106  AST 22  ALT 19  ALKPHOS 74  BILITOT 1.0  PROT 7.0  ALBUMIN 3.4*    Recent Labs Lab 01/11/17 1106  LIPASE 23   No results for input(s): AMMONIA in the last 168 hours. Coagulation Profile: No results for input(s): INR, PROTIME in the last 168 hours. Cardiac Enzymes:  Recent Labs Lab 01/11/17 2116 01/12/17 0248 01/12/17 0853  TROPONINI <0.03 <0.03 <0.03   BNP (last 3 results) No results for input(s): PROBNP in the last 8760 hours. HbA1C: No results for input(s): HGBA1C in the last 72 hours. CBG: No results for input(s): GLUCAP in the last 168 hours. Lipid Profile: No results for input(s): CHOL, HDL, LDLCALC, TRIG, CHOLHDL, LDLDIRECT in the last 72 hours. Thyroid Function Tests:  Recent Labs  01/11/17 2117  TSH 1.995   Anemia Panel: No results for input(s): VITAMINB12, FOLATE, FERRITIN, TIBC, IRON, RETICCTPCT in the last 72 hours. Urine analysis:    Component Value Date/Time   COLORURINE YELLOW 01/11/2017 1559   APPEARANCEUR CLEAR 01/11/2017 1559   LABSPEC 1.025 01/11/2017 1559   PHURINE 6.0 01/11/2017 1559   GLUCOSEU NEGATIVE 01/11/2017 1559   HGBUR NEGATIVE 01/11/2017 1559   BILIRUBINUR NEGATIVE 01/11/2017 1559   KETONESUR NEGATIVE 01/11/2017 1559   PROTEINUR NEGATIVE 01/11/2017 1559   UROBILINOGEN 0.2 02/09/2015 1322   NITRITE NEGATIVE 01/11/2017 1559   LEUKOCYTESUR NEGATIVE 01/11/2017 1559     ) Recent Results (from the past 240 hour(s))  MRSA PCR Screening     Status: None   Collection Time: 01/11/17 10:24 PM  Result Value Ref Range Status   MRSA by PCR NEGATIVE NEGATIVE Final    Comment:        The GeneXpert MRSA Assay (FDA approved for NASAL specimens only), is one component of a comprehensive MRSA colonization surveillance program. It is not intended to diagnose MRSA infection nor to guide or monitor treatment for MRSA infections.       Anti-infectives     None       Radiology Studies: Dg Chest 2 View  Result Date: 01/11/2017 CLINICAL DATA:  Stabbing right chest pain for 2 weeks. Chills, cough, nausea and vomiting. EXAM: CHEST  2 VIEW COMPARISON:  08/12/2016 FINDINGS: Mild enlargement of the cardiopericardial silhouette without edema. Thoracic spondylosis. No pleural effusion. IMPRESSION: 1. Mild enlargement of the cardiopericardial silhouette, without edema. 2. Thoracic spondylosis. Electronically Signed   By: Van Clines M.D.   On: 01/11/2017 15:41   Ct Angio Chest Pe W Or Wo Contrast  Result Date: 01/11/2017 CLINICAL DATA:  61 year old female with dyspnea and hypoxia. EXAM: CT ANGIOGRAPHY CHEST WITH CONTRAST TECHNIQUE: Multidetector CT imaging of the chest was performed using the standard protocol during bolus administration of intravenous contrast. Multiplanar CT image reconstructions and  MIPs were obtained to evaluate the vascular anatomy. CONTRAST:  100 cc Isovue 370 COMPARISON:  Chest radiograph dated 01/11/2017 FINDINGS: Cardiovascular: Borderline cardiac size. No pericardial effusion. The thoracic aorta is unremarkable. There is no CT evidence of pulmonary embolism. Mediastinum/Nodes: No hilar or mediastinal adenopathy. Small amount of fluid within the esophagus likely related to gastroesophageal reflux. Lungs/Pleura: The lungs are clear. There is no pleural effusion or pneumothorax. The central airways are patent. Upper Abdomen: The visualized upper abdomen is unremarkable. Musculoskeletal: Mild degenerative changes of the spine. No acute osseous pathology. Review of the MIP images confirms the above findings. IMPRESSION: No acute intrathoracic pathology. No CT evidence of pulmonary embolism. Electronically Signed   By: Anner Crete M.D.   On: 01/11/2017 23:34        Scheduled Meds: . amLODipine  10 mg Oral Daily  . clonazePAM  0.5 mg Oral QHS  . furosemide  40 mg Oral Daily  . metoprolol succinate  50 mg Oral Q2200  .  multivitamin with minerals  1 tablet Oral Daily  . pantoprazole  40 mg Oral Daily  . topiramate  50 mg Oral BID   Continuous Infusions:   LOS: 1 day    Time spent: 35 min    [place], DO [facility] Pager [phone]  If 7PM-7AM, please contact night-coverage [email or web address] Password [email or web address] 01/12/2017, 11:59 AM

## 2017-01-12 NOTE — Progress Notes (Signed)
Came back to see patient as it was reported that patient more sedate after phenergan and dilaudid.  When I entered room, patient was talking with pastor.  She was able to carry on full conversation.  After pastor left, patient began to fall asleep mid sentence.  Will get ABG and change BIpap to PRN after tranfer to SDU for closer monitoring. Await echo and RUQ U/S Eulogio Bear DO

## 2017-01-12 NOTE — Progress Notes (Signed)
RT set up CPAP and placed on patient with 2L O2 bled into circuit. Patient tolerating well at this time. RT will monitor as needed. 

## 2017-01-12 NOTE — Progress Notes (Signed)
   01/12/17 1035  Clinical Encounter Type  Visited With Patient and family together  Visit Type Other (Comment) ([place] consult)  Spiritual Encounters  Spiritual Needs Prayer  Stress Factors  Patient Stress Factors None identified  Family Stress Factors None identified  Introduction to Pt and family. Offered prayer of comfort and hope.

## 2017-01-12 NOTE — Progress Notes (Signed)
CM received consult: medication needs. Pt has medicaid , is not eligible for medication assistannce. [facility]

## 2017-01-12 NOTE — Progress Notes (Signed)
Pt taken down for ECHO on 2LNC sat 92% in no distress. Pt  will then get transfer to 4N08 post procedure.

## 2017-01-12 NOTE — Progress Notes (Signed)
Patient continues to desat to 70s and 80s on room air at rest.  When placed back on [place] O2, oxygen saturation shoots back up into 90s immediately.  Will continue to monitor patient.

## 2017-01-12 NOTE — Progress Notes (Signed)
Patient alert and oriented. RN stated she will call if patient declines and is to need BiPAP.

## 2017-01-12 NOTE — Progress Notes (Signed)
  Echocardiogram 2D Echocardiogram has been performed.  Jennette Dubin 01/12/2017, 4:46 PM

## 2017-01-13 ENCOUNTER — Encounter (HOSPITAL_COMMUNITY): Payer: Self-pay | Admitting: Cardiology

## 2017-01-13 DIAGNOSIS — R931 Abnormal findings on diagnostic imaging of heart and coronary circulation: Secondary | ICD-10-CM

## 2017-01-13 DIAGNOSIS — R6889 Other general symptoms and signs: Secondary | ICD-10-CM

## 2017-01-13 LAB — CBC
HCT: 39.5 % (ref 36.0–46.0)
Hemoglobin: 12.3 g/dL (ref 12.0–15.0)
MCH: 32.1 pg (ref 26.0–34.0)
MCHC: 31.1 g/dL (ref 30.0–36.0)
MCV: 103.1 fL — ABNORMAL HIGH (ref 78.0–100.0)
PLATELETS: 268 10*3/uL (ref 150–400)
RBC: 3.83 MIL/uL — AB (ref 3.87–5.11)
RDW: 14.9 % (ref 11.5–15.5)
WBC: 4.4 10*3/uL (ref 4.0–10.5)

## 2017-01-13 LAB — BASIC METABOLIC PANEL
ANION GAP: 7 (ref 5–15)
BUN: 17 mg/dL (ref 6–20)
CALCIUM: 8.7 mg/dL — AB (ref 8.9–10.3)
CO2: 33 mmol/L — ABNORMAL HIGH (ref 22–32)
Chloride: 98 mmol/L — ABNORMAL LOW (ref 101–111)
Creatinine, Ser: 1.5 mg/dL — ABNORMAL HIGH (ref 0.44–1.00)
GFR, EST AFRICAN AMERICAN: 43 mL/min — AB (ref >60)
GFR, EST NON AFRICAN AMERICAN: 37 mL/min — AB (ref >60)
Glucose, Bld: 115 mg/dL — ABNORMAL HIGH (ref 65–99)
Potassium: 3.9 mmol/L (ref 3.5–5.1)
Sodium: 138 mmol/L (ref 135–145)

## 2017-01-13 LAB — BLOOD GAS, ARTERIAL
Acid-Base Excess: 6.1 mmol/L — ABNORMAL HIGH (ref 0.0–2.0)
Bicarbonate: 31.3 mmol/L — ABNORMAL HIGH (ref 20.0–28.0)
DRAWN BY: 331471
O2 Saturation: 87.5 %
PATIENT TEMPERATURE: 98.6
pCO2 arterial: 55.8 mmHg — ABNORMAL HIGH (ref 32.0–48.0)
pH, Arterial: 7.368 (ref 7.350–7.450)
pO2, Arterial: 54.3 mmHg — ABNORMAL LOW (ref 83.0–108.0)

## 2017-01-13 MED ORDER — KETOROLAC TROMETHAMINE 15 MG/ML IJ SOLN
15.0000 mg | Freq: Once | INTRAMUSCULAR | Status: AC
  Administered 2017-01-13: 15 mg via INTRAVENOUS
  Filled 2017-01-13: qty 1

## 2017-01-13 MED ORDER — MORPHINE SULFATE (PF) 4 MG/ML IV SOLN
1.0000 mg | INTRAVENOUS | Status: DC | PRN
  Administered 2017-01-13 – 2017-01-14 (×2): 1 mg via INTRAVENOUS
  Filled 2017-01-13 (×2): qty 1

## 2017-01-13 MED ORDER — ACETAMINOPHEN 325 MG PO TABS: 650.0000 mg | ORAL_TABLET | Freq: Four times a day (QID) | ORAL | Status: DC | PRN

## 2017-01-13 MED ORDER — DIPHENHYDRAMINE HCL 50 MG/ML IJ SOLN
12.5000 mg | Freq: Once | INTRAMUSCULAR | Status: AC
  Administered 2017-01-13: 12.5 mg via INTRAVENOUS
  Filled 2017-01-13: qty 1

## 2017-01-13 MED ORDER — PROCHLORPERAZINE EDISYLATE 5 MG/ML IJ SOLN
10.0000 mg | Freq: Once | INTRAMUSCULAR | Status: AC
  Administered 2017-01-13: 10 mg via INTRAVENOUS
  Filled 2017-01-13: qty 2

## 2017-01-13 NOTE — Progress Notes (Signed)
Administered migraine cocktail at 1800, following replacement of IV.  Pt. desat to 70s when asleep, respiratory called and placed CPAP.  Pt. O2 sats at 95%

## 2017-01-13 NOTE — Care Management Note (Addendum)
Case Management Note  Patient Details  Name: Ann [name] MRN: [id number] Date of Birth: 1955-09-10  Subjective/Objective:  Pt admitted with abd pain and coffee ground emesis               Action/Plan:   PTA from home.  Pt has some social issues including sickly husband (CHF) active [place] for [facility].  Pt states she has to hold on to items to get around in the home - has no equipment.  Pt states she has been told by two doctors that she needs ortho surgery but must loose weight first.  PT eval ordered for equipment needs as pt refuses SNF regardless of recommendation   Expected Discharge Date:                  Expected Discharge Plan:  [place]  In-House Referral:     Discharge planning Services  CM Consult  Post Acute Care Choice:    Choice offered to:     DME Arranged:    DME Agency:     HH Arranged:    [place] Agency:     Status of Service:     If discussed at [employer], dates discussed:    Additional Comments:  Maryclare Labrador, RN 01/13/2017, 2:42 PM

## 2017-01-13 NOTE — Consult Note (Signed)
Cardiology Consultation:   Patient ID: Ann [name]; [id number]; 25-Dec-1955   Admit date: 01/11/2017 Date of Consult: 01/13/2017  Primary Care Provider: Berkley Harvey, Ann [name] Primary Cardiologist: 2 years ago Dr. Johnsie Cancel but pt would like Dr. Marlou Porch as he  Primary Electrophysiologist:  NA   Patient Profile:   Ann [name] is a 61 y.o. female with a hx of OSA not wearing CPAP, anxiety, esophageal strictures with multiple dilatations, remote nuc studies neg for ischemia 2007,  HTN,  HLD, and morbid obesity who is being seen today for the evaluation of hypoxia and dyspnea, and decreased EF at 45-50% at the request of Dr. Eliseo Squires.  Pt admitted with Rt upper quad pain and nausea and vomiting of coffee-ground emesis.   troponins neg.  History of Present Illness:   Ann [name] has a hx of obesity, OSA no longer wearing CPAP, anxiety and esophageal strictures with multiple dilatations.  Also HTN, migraines, borderline DM, on inhalers, HLD , hx of Nuc cardiac study in 2007 neg for ischemia normal wall motion and EF 59%.   Remotely saw Dr. Johnsie Cancel for consult for chest pain but attributed to GI.    Outpt meds of Hyzaar, torpold 50, amlodipine 10, prn lasix.   Pt presented 01/11/17 with Rt UQ pain and vomiting and diarrhea.  She was hypoxic to 85% on room air.  No chest pain.  Did have lower ext edema.  Neg PE on CTA.  abd U/S with multiple stone in gallbladder chronic without acute process, + fatty liver.  She was admitted and on placed oxygen.  Placed on BiPAP at night.   Troponins are neg 0.00, <0.03 X 3. Cr 1.08, BUN 14 K+ 4.0 LFTs normal and lipase 23. CBC with WBC 9.8 and Hgb 13.7 all normal  EKG 01/11/17 SR with PACs no acute changes from previous EKG 08/2015  I personally reviewed.  Now with Echo EF 45-50% , mild LVH, diffuse hypokinesis, G1DD, trace MR and TR.   Currently with her daughter in room it is noted she has strong FH of premature CAD with MIs and death.  Her daughter has CHF.  The pt has had  episodes of sharp shooting chest pain that have occurred about once a month.  Last a few minutes.  They have wakened from sleep.  this pain is different.  The sharp chest pain is different from her esophageal stricture pain which is a choking sensation.   Pt has episodes of elevated BP to 761 systolic at home.  Also episodes where she passes out while sitting for brief periods of time.  She has been caring for her husband and ignoring herself which includes stopping CPAP.    Currently walked to [facility] with help.  + DOE.   When she was pregnant > 20 years ago she had cardiac problem as well.  She has a hx of TIAs as well.   Troponin done at PCP on 12/16/16 was <0.30, pro BNP 33.3  HGBA1C was 5.5 02/2016  LDL 2016 was 117.   Past Medical History:  Diagnosis Date  . Allergy    seasonal  . Anemia    with past pregnancy -not recent  . Anxiety   . Arthritis   . Borderline diabetes   . Chronic cystitis   . Complication of anesthesia    02-20-2014 ([facility]) INTRA-OP RESPIRATORY FAILURE SECONDARY TO POSSIBLE MUCOUS ASPIRATION/  ALSO 06-06-2013 [facility]) POST-OP DESATURATION  EVEN USING CPAP, States some issues if Propofol given  to rapidly.  . Diverticulosis of colon   . Family history of adverse reaction to anesthesia    PER PT SISTER DIED AFTER GENERAL ANESTHESIA DUE TO UNDIAGNOSED OSA  . GERD (gastroesophageal reflux disease)   . H/O hiatal hernia   . Headache(784.0)    severe headaches   . History of chronic cough    "tight sounding cough"  . History of esophageal dilatation   . History of MRSA infection    3/ 2015  AXILLARY ABSCESS  . History of recurrent UTIs   . History of TIAs NO RESIDUAL   1980;  2005;   2008 PT STATES PER CT  SCARRING RIGHT SIDE OF BRAIN  . Hyperlipidemia   . Hypertension   . Kidney disease    III  . Neuromuscular disorder ([place])    HH  . Neuropathy    pt denies this 07-05-2015  . Nocturia   . OSA on CPAP    PER SLEEP STUDY 09-08-2012  MODERATE OSA. not used in awhile, but  thinks needs to start back using due to some weight gain.  . Sleep apnea   . Stroke ([place])    TIA'S  . SUI (stress urinary incontinence, female)     Past Surgical History:  Procedure Laterality Date  . ANTERIOR AND POSTERIOR REPAIR N/A 06/06/2013   Procedure: Anterior vaginal vault repair, Sacrospinous ligament fixation with UPHOLD lite, Kelly plication, Sacrospinous mesh fixation, Solyx transurethral sling;  Surgeon: Ailene Rud, MD;  Location: [facility];  Service: Urology;  Laterality: N/A;  . CYSTOSCOPY W/ URETERAL STENT PLACEMENT Right 04/21/2014   Procedure: CYSTOSCOPY WITH RETROGRADE PYELOGRAM/URETERAL STENT PLACEMENT;  Surgeon: Ailene Rud, MD;  Location: [facility];  Service: Urology;  Laterality: Right;  . CYSTOSCOPY W/ URETERAL STENT PLACEMENT Right 11/21/2014   Procedure: CYSTOSCOPY WITH RETROGRADE PYELOGRAM, URETEROSCOPY WITH STONE REMOVAL, URETERAL STENT PLACEMENT;  Surgeon: Carolan Clines, MD;  Location: [facility];  Service: Urology;  Laterality: Right;  . CYSTOSCOPY W/ URETERAL STENT PLACEMENT Left 02/01/2016   Procedure: CYSTO, LEFT URETEROSCOPY, LEFT RETROGRADE AND LEFT URETERAL STENT PLACEMENT;  Surgeon: Carolan Clines, MD;  Location: [facility];  Service: Urology;  Laterality: Left;  . CYSTOSCOPY WITH RETROGRADE PYELOGRAM, URETEROSCOPY AND STENT PLACEMENT Right 06/05/2014   Procedure: CYSTOSCOPY WITH RIGHT RETROGRADE PYELOGRAM, RIGHT URETEROSCOPY, STONE EXTRACTION AND RIGHT DOUBLE J STENT PLACEMENT;  Surgeon: Ailene Rud, MD;  Location: [facility];  Service: Urology;  Laterality: Right;  . ESOPHAGEAL DILATION  X2  LAST ONE  JUNE 2014  . ESOPHAGEAL MANOMETRY N/A 09/10/2015   Procedure: ESOPHAGEAL MANOMETRY (EM);  Surgeon: Manus Gunning, MD;  Location: [facility];  Service: Gastroenterology;  Laterality: N/A;  . HOLMIUM LASER APPLICATION Right 16/96/7893   Procedure: HOLMIUM LASER OF STONE ;  Surgeon: Ailene Rud, MD;  Location[facility];  Service: Urology;  Laterality: Right;  . KNEE ARTHROSCOPY Bilateral 2008  . MULTIPLE TOOTH EXTRACTIONS     past hx  . RECTOCELE REPAIR N/A 02/20/2014   Procedure: POSTERIOR REPAIR (RECTOCELE) WITH VAGINAL VAULT REPAIR WITH ACELL GRAFT PLACEMENT, PERINEOPLASTY, ENTEROCELE REPAIR;  Surgeon: Ailene Rud, MD;  Location: [facility];  Service: Urology;  Laterality: N/A;  . TUBAL LIGATION  1987     Inpatient Medications: Scheduled Meds: . amLODipine  10 mg Oral Daily  . clonazePAM  0.5 mg Oral QHS  . metoprolol succinate  50 mg Oral Q2200  . multivitamin with minerals  1 tablet  Oral Daily  . pantoprazole  40 mg Oral Daily  . topiramate  50 mg Oral BID   Continuous Infusions:  PRN Meds: acetaminophen, albuterol, butalbital-acetaminophen-caffeine, diphenhydrAMINE, fluticasone, morphine injection, ondansetron (ZOFRAN) IV, oxyCODONE  Allergies:    Allergies  Allergen Reactions  . Doxycycline Diarrhea  . Metronidazole Other (See Comments)    Severe headaches    Social History:   Social History   Social History  . Marital status: Married    Spouse name: Herbie [place]  . Number of children: 3  . Years of education: 12   Occupational History  . sales    Social History Main Topics  . Smoking status: Never Smoker  . Smokeless tobacco: Never Used  . Alcohol use No  . Drug use: No  . Sexual activity: No   Other Topics Concern  . Not on file   Social History Narrative   Patient lives at home with her husband 37 years. Herbie [place]). Patient has a 12th grade education.    Right handed.    Caffeine - two daily.    Family History:   The patient's family history includes Colon cancer (age of onset: 6) in her father; Heart attack in her mother and sister; Heart attack (age of onset: 39) in her cousin; Heart attack (age of onset: 88) in her cousin; Heart disease in her mother; Hypertension in her brother. There is no history of  Esophageal cancer, Rectal cancer, Stomach cancer, or Colon polyps.  ROS:  Please see the history of present illness.  ROS  General:no colds or fevers, no weight changes Skin:no rashes or ulcers HEENT:no blurred vision, no congestion CV:see HPI PUL:see HPI GI:no diarrhea constipation or melena, no indigestion GU:no hematuria, no dysuria MS:no joint pain, no claudication, + lower ext edema more recently  Neuro:no syncope, no lightheadedness Endo:no diabetes, no thyroid disease   Physical Exam/Data:   Vitals:   01/13/17 0900 01/13/17 1000 01/13/17 1100 01/13/17 1144  BP: 104/62 108/70 110/71 (!) 106/59  Pulse: 82 64 60   Resp: 13 17 15    Temp:   98.3 F (36.8 C)   TempSrc:   Oral   SpO2: 95% 94% 98%   Weight:      Height:        Intake/Output Summary (Last 24 hours) at 01/13/17 1444 Last data filed at 01/13/17 1300  Gross per 24 hour  Intake              840 ml  Output                0 ml  Net              840 ml   Filed Weights   01/11/17 1059  Weight: 240 lb (108.9 kg)   Body mass index is 42.51 kg/m.  General:  Well nourished, well developed, in no acute distress; obese. Complaining of severe headache HEENT: normal Lymph: no adenopathy Neck: no JVD Endocrine:  No thryomegaly Vascular: No carotid bruits; FA pulses 2+ bilaterally without bruits  Cardiac:  normal S1, S2; RRR - with occasional ectopy; no murmur gallup rub or click premature beats Lungs:  clear to auscultation bilaterally, no wheezing, rhonchi or rales  Abd: obese,soft, nontender, no hepatomegaly  Ext: no edema Musculoskeletal:  No deformities, BUE and BLE strength normal and equal Skin: warm and dry  Neuro:  Alert and oriented X 3 MAE follows commands, +facial symmetry Psych:  Normal affect to flat  Telemetry:  Telemetry was  personally reviewed and demonstrates:  SR with PACs  - no A. fib  Relevant CV Studies:  Echo 01/12/17: Mild globally reduced EF 45-50% with diffuse hypokinesis. GR 1 DD.  Trace TR and MR. Mild diastolic dysfunction   Laboratory Data:  Chemistry  Recent Labs Lab 01/11/17 1106 01/12/17 0853 01/13/17 0300  NA 139 138 138  K 4.0 4.2 3.9  CL 107 104 98*  CO2 24 25 33*  GLUCOSE 131* 96 115*  BUN 14 16 17   CREATININE 1.08* 1.18* 1.50*  CALCIUM 8.7* 8.7* 8.7*  GFRNONAA 55* 50* 37*  GFRAA >60 58* 43*  ANIONGAP 8 9 7      Recent Labs Lab 01/11/17 1106  PROT 7.0  ALBUMIN 3.4*  AST 22  ALT 19  ALKPHOS 74  BILITOT 1.0   Hematology  Recent Labs Lab 01/11/17 1106 01/12/17 1003 01/13/17 0300  WBC 9.8 4.6 4.4  RBC 4.27 4.08 3.83*  HGB 13.7 13.2 12.3  HCT 42.0 41.5 39.5  MCV 98.4 101.7* 103.1*  MCH 32.1 32.4 32.1  MCHC 32.6 31.8 31.1  RDW 14.5 14.9 14.9  PLT 270 257 268   Cardiac Enzymes  Recent Labs Lab 01/11/17 2116 01/12/17 0248 01/12/17 0853  TROPONINI <0.03 <0.03 <0.03     Recent Labs Lab 01/11/17 1722  TROPIPOC 0.00    BNP  Recent Labs Lab 01/11/17 1106  BNP 10.4    DDimer No results for input(s): DDIMER in the last 168 hours.  Radiology/Studies:  Dg Chest 2 View  Result Date: 01/11/2017 CLINICAL DATA:  Stabbing right chest pain for 2 weeks. Chills, cough, nausea and vomiting. EXAM: CHEST  2 VIEW COMPARISON:  08/12/2016 FINDINGS: Mild enlargement of the cardiopericardial silhouette without edema. Thoracic spondylosis. No pleural effusion. IMPRESSION: 1. Mild enlargement of the cardiopericardial silhouette, without edema. 2. Thoracic spondylosis. Electronically Signed   By: Van Clines M.D.   On: 01/11/2017 15:41   Ct Angio Chest Pe W Or Wo Contrast  Result Date: 01/11/2017 CLINICAL DATA:  61 year old female with dyspnea and hypoxia. EXAM: CT ANGIOGRAPHY CHEST WITH CONTRAST TECHNIQUE: Multidetector CT imaging of the chest was performed using the standard protocol during bolus administration of intravenous contrast. Multiplanar CT image reconstructions and MIPs were obtained to evaluate the vascular anatomy.  CONTRAST:  100 cc Isovue 370 COMPARISON:  Chest radiograph dated 01/11/2017 FINDINGS: Cardiovascular: Borderline cardiac size. No pericardial effusion. The thoracic aorta is unremarkable. There is no CT evidence of pulmonary embolism. Mediastinum/Nodes: No hilar or mediastinal adenopathy. Small amount of fluid within the esophagus likely related to gastroesophageal reflux. Lungs/Pleura: The lungs are clear. There is no pleural effusion or pneumothorax. The central airways are patent. Upper Abdomen: The visualized upper abdomen is unremarkable. Musculoskeletal: Mild degenerative changes of the spine. No acute osseous pathology. Review of the MIP images confirms the above findings. IMPRESSION: No acute intrathoracic pathology. No CT evidence of pulmonary embolism. Electronically Signed   By: Anner Crete M.D.   On: 01/11/2017 23:34   US Abdomen Limited Ruq  Result Date: 01/12/2017 CLINICAL DATA:  61 year old female with right upper quadrant abdominal pain. EXAM: ULTRASOUND ABDOMEN LIMITED RIGHT UPPER QUADRANT COMPARISON:  Abdominal CT dated 12/02/2016 FINDINGS: Gallbladder: There multiple stones within the gallbladder. There is no gallbladder wall thickening or pericholecystic fluid. Negative sonographic Murphy's sign. Common bile duct: Diameter: 4 mm Liver: There is diffuse increased liver echogenicity most consistent with fatty infiltration. Superimposed inflammation or fibrosis is not excluded. The main portal vein is patent with hepatopetal direction.  IMPRESSION: 1. Cholelithiasis without sonographic evidence acute cholecystitis. 2. Fatty liver. Electronically Signed   By: Anner Crete M.D.   On: 01/12/2017 21:28    Assessment and Plan:   1. Decreased EF 45-50% could be due to OSA untreated and HTN that has not been well controlled.  She has had chest pain that is atypical.  Ischemic eval would be helpful with strong FH of premature CAD.  Either Cath or cardiac CTA. Dr. Ellyn Hack to see. Neg  MI 2. OSA untreated.  Needs her CPAP and may need pul. Consult for her desat.  3. Hypoxia does not sound cardiac, more pulmonary  4. HTN  Controlled to low here 5. Episodes of "passing out" brief episodes no arythymias on monitor to cause this  ? episodic hypotension 6. Elevated glucose per IM 7. Depression and anxiety   Signed, Ann Kicks, Ann [name]  01/13/2017 2:44 PM   I have seen, examined and evaluated the patient this PM along with Ann Kicks, Ann [name].  After reviewing all the available data and chart, we discussed the patients laboratory, study & physical findings as well as symptoms in detail. I agree with her findings, examination as well as impression recommendations as per our discussion.    Attending adjustments noted in italics.   Ann [name] presents with multiple different complaints including right or quadrant pain epigastric/substernal chest pain as well as headache. She was admitted however because of oxygen desaturation noted on the monitor. She then had an echocardiogram performed which showed mildly reduced EF with global hypokinesis. I do not think this is anything to do with her hypoxia as she is not showing signs of any pulmonary edema or acute heart failure. Patient is a this is probably more related to her obesity hypoventilation syndrome/OSA. She has been noncompliant with her CPAP.  She is complaining of somewhat atypical sounding chest pain that is sharp stabbing type pain followed by a squeezing type pain. It was described to Mickel Baas as being at rest, and then to me with being with any exertion. The story is ever changing.  She does however have a pretty significant family history of coronary disease, and multiple risk factors of her own including hypertension, hyperlipidemia and obesity with borderline diabetes  -- equaling metabolic syndrome.  Would chest pain that is at least moderate risk for cardiac etiology with her story and reduced EF on echo, I do think we need to do an  ischemic evaluation. I am leery of doing a nuclear stress test given her body habitus that we would probably a false positive and would prefer to have a true anatomic evaluation. Plan: Coronary CTA with potential for CT FFR (which then also allows her physiologic evaluation).  If by any chance there is a sign of ischemic CAD on this study, I would then consider right and left heart catheterization as opposed to simply left heart cath.  For now would recommend blood pressure control. She's had some edema, would consider adding a diuretic.  More recommendations pending results of her coronary CTA in the morning.   Ann [name], M.D., M.S. Interventional Cardiologist   Pager # [phone] Phone # [phone] [address] [place], [place] [address]   Ann [name], M.D., M.S. Interventional Cardiologist   Pager # [phone] Phone # [phone] [address] [place], [place] [address]

## 2017-01-13 NOTE — Progress Notes (Signed)
PROGRESS NOTE    Ann [name]  YNW:[id number] DOB: [birth year] DOA: 01/11/2017 PCP: Berkley Harvey, NP   Outpatient Specialists:     Brief Narrative:  Ann [name] is a 61 y.o. female with multiple medical conditions including anxiety, OSA on CPAP, recurrent UTI's, multiple esophageal dilations done By Dr. Olevia Perches, had apparently some adenomas removed, now presented to [facility] with main concern of 3 days duration of progressively worsening RUQ abd pain that is sharp and occasionally radiating to epigastric area, 7/10 in severity and associated with 3-4 episodes of coffee -ground emesis and dark diarrhea. In addition, she was not able to eat much and has experienced worsening migraine headaches. Per daughter at bedside, she has noted that pt had intermittent and rather significant episodes of LE swelling.  ED Course: Pt is hemodynamically stable, VS stable except hypoxia on RA 85%, requiring placement on oxygen via [place]. Blood work unremarkable except mild rise in Cr 1.08. Etiology of hypoxia unclear and TRH asked to admit for further evaluation.    Assessment & Plan:   Active Problems:   Hypoxia   Dyspnea   Dyspnea and hypoxia - unclear etiology but tachypnea was present on exam at admission -5L O2 weaned to 2L (has not always had a good pleth so not sure how accurate O2 sat has been) -patient is caring for dying husband and under significant stress/?anxiety driven -has had on and of chest pain -CTA negative for PE and does not comment on fluid/pleural effusions - patient was given lasix at admission but has not had significant diuresis and Cr has increased so doubt CHF and lasix was d/c'd - echo shows EF 45-50 and diffuse hypokinesis-- coupled with SOB/chest pain-- will get cardiology - strict I/O, daily weights -  CE's negative - TSH normal -ABG shows some CO2 retention-- recheck after Bipap and keep O2 sats 90-92% -patient is a non-smoker but has been exposed to 2nd hand  smoke  Untreated OSA -has not worn CPAP in months -ordered here  HTN, essential - continue Norvasc and Metoprolol - hold losartan and HCTZ for now and resume once Cr back to normal limits   Coffee ground emesis, RUQ pain, bloody diarrhea - unclear etiology - physical exam fairly unremarkable and pt currently denies any pain  - LFT's stable - Hg stable - RUQ U/S- shows stones but nothing acute-- surgery follow up as outpatient  Morbid obesity - Body mass index is 42.51 kg/m.  Hx of kidney stones - recent CT abd noted small stone on left side, nonobstructive - also mild inflammatory changes on right ureter likely from passed stone   Headache -will give migraine cocktail after seen by cardiology   DVT prophylaxis:  SCD's  Code Status: Full Code   Family Communication: Daughter at bedside  Disposition Plan:     Consultants:   cardiology     Subjective: Still with headache-- has not been sleeping well with Bipap on Productive cough  Objective: Vitals:   01/13/17 0025 01/13/17 0315 01/13/17 0500 01/13/17 0756  BP: 105/65 113/74 108/67 (!) 103/55  Pulse: (!) 54 90 63 74  Resp: 17 (!) 28 13 19   Temp:  98.5 F (36.9 C)  98.7 F (37.1 C)  TempSrc:  Oral  Oral  SpO2: 95% (!) 87%  99%  Weight:      Height:        Intake/Output Summary (Last 24 hours) at 01/13/17 0920 Last data filed at 01/13/17 0803  Gross per 24  hour  Intake              360 ml  Output                0 ml  Net              360 ml   Filed Weights   01/11/17 1059  Weight: 108.9 kg (240 lb)    Examination:  General exam: calmer today Respiratory system: clear after cough, no wheezing Cardiovascular system: rrr, occasional PVC and sinus irregularity Gastrointestinal system: +BS, soft, no RUQ tenderness Central nervous system: A+Ox3, NAD. Extremities: moves all 4 ext- tender to palpation in LE Skin: No rashes, lesions or ulcers Psychiatry: Judgement and insight appear  normal    Data Reviewed: I have personally reviewed following labs and imaging studies  CBC:  Recent Labs Lab 01/11/17 1106 01/12/17 1003 01/13/17 0300  WBC 9.8 4.6 4.4  HGB 13.7 13.2 12.3  HCT 42.0 41.5 39.5  MCV 98.4 101.7* 103.1*  PLT 270 257 416   Basic Metabolic Panel:  Recent Labs Lab 01/11/17 1106 01/12/17 0853 01/13/17 0300  NA 139 138 138  K 4.0 4.2 3.9  CL 107 104 98*  CO2 24 25 33*  GLUCOSE 131* 96 115*  BUN 14 16 17   CREATININE 1.08* 1.18* 1.50*  CALCIUM 8.7* 8.7* 8.7*   GFR: Estimated Creatinine Clearance: 48.4 mL/min (A) (by C-G formula based on SCr of 1.5 mg/dL (H)). Liver Function Tests:  Recent Labs Lab 01/11/17 1106  AST 22  ALT 19  ALKPHOS 74  BILITOT 1.0  PROT 7.0  ALBUMIN 3.4*    Recent Labs Lab 01/11/17 1106  LIPASE 23   No results for input(s): AMMONIA in the last 168 hours. Coagulation Profile: No results for input(s): INR, PROTIME in the last 168 hours. Cardiac Enzymes:  Recent Labs Lab 01/11/17 2116 01/12/17 0248 01/12/17 0853  TROPONINI <0.03 <0.03 <0.03   BNP (last 3 results) No results for input(s): PROBNP in the last 8760 hours. HbA1C: No results for input(s): HGBA1C in the last 72 hours. CBG: No results for input(s): GLUCAP in the last 168 hours. Lipid Profile: No results for input(s): CHOL, HDL, LDLCALC, TRIG, CHOLHDL, LDLDIRECT in the last 72 hours. Thyroid Function Tests:  Recent Labs  01/11/17 2117  TSH 1.995   Anemia Panel: No results for input(s): VITAMINB12, FOLATE, FERRITIN, TIBC, IRON, RETICCTPCT in the last 72 hours. Urine analysis:    Component Value Date/Time   COLORURINE YELLOW 01/11/2017 1559   APPEARANCEUR CLEAR 01/11/2017 1559   LABSPEC 1.025 01/11/2017 1559   PHURINE 6.0 01/11/2017 1559   GLUCOSEU NEGATIVE 01/11/2017 1559   HGBUR NEGATIVE 01/11/2017 1559   BILIRUBINUR NEGATIVE 01/11/2017 1559   KETONESUR NEGATIVE 01/11/2017 1559   PROTEINUR NEGATIVE 01/11/2017 1559    UROBILINOGEN 0.2 02/09/2015 1322   NITRITE NEGATIVE 01/11/2017 1559   LEUKOCYTESUR NEGATIVE 01/11/2017 1559     ) Recent Results (from the past 240 hour(s))  MRSA PCR Screening     Status: None   Collection Time: 01/11/17 10:24 PM  Result Value Ref Range Status   MRSA by PCR NEGATIVE NEGATIVE Final    Comment:        The GeneXpert MRSA Assay (FDA approved for NASAL specimens only), is one component of a comprehensive MRSA colonization surveillance program. It is not intended to diagnose MRSA infection nor to guide or monitor treatment for MRSA infections.       Anti-infectives  None       Radiology Studies: Dg Chest 2 View  Result Date: 01/11/2017 CLINICAL DATA:  Stabbing right chest pain for 2 weeks. Chills, cough, nausea and vomiting. EXAM: CHEST  2 VIEW COMPARISON:  08/12/2016 FINDINGS: Mild enlargement of the cardiopericardial silhouette without edema. Thoracic spondylosis. No pleural effusion. IMPRESSION: 1. Mild enlargement of the cardiopericardial silhouette, without edema. 2. Thoracic spondylosis. Electronically Signed   By: Van Clines M.D.   On: 01/11/2017 15:41   Ct Angio Chest Pe W Or Wo Contrast  Result Date: 01/11/2017 CLINICAL DATA:  61 year old female with dyspnea and hypoxia. EXAM: CT ANGIOGRAPHY CHEST WITH CONTRAST TECHNIQUE: Multidetector CT imaging of the chest was performed using the standard protocol during bolus administration of intravenous contrast. Multiplanar CT image reconstructions and MIPs were obtained to evaluate the vascular anatomy. CONTRAST:  100 cc Isovue 370 COMPARISON:  Chest radiograph dated 01/11/2017 FINDINGS: Cardiovascular: Borderline cardiac size. No pericardial effusion. The thoracic aorta is unremarkable. There is no CT evidence of pulmonary embolism. Mediastinum/Nodes: No hilar or mediastinal adenopathy. Small amount of fluid within the esophagus likely related to gastroesophageal reflux. Lungs/Pleura: The lungs are clear.  There is no pleural effusion or pneumothorax. The central airways are patent. Upper Abdomen: The visualized upper abdomen is unremarkable. Musculoskeletal: Mild degenerative changes of the spine. No acute osseous pathology. Review of the MIP images confirms the above findings. IMPRESSION: No acute intrathoracic pathology. No CT evidence of pulmonary embolism. Electronically Signed   By: Anner Crete M.D.   On: 01/11/2017 23:34   US Abdomen Limited Ruq  Result Date: 01/12/2017 CLINICAL DATA:  60 year old female with right upper quadrant abdominal pain. EXAM: ULTRASOUND ABDOMEN LIMITED RIGHT UPPER QUADRANT COMPARISON:  Abdominal CT dated 12/02/2016 FINDINGS: Gallbladder: There multiple stones within the gallbladder. There is no gallbladder wall thickening or pericholecystic fluid. Negative sonographic Murphy's sign. Common bile duct: Diameter: 4 mm Liver: There is diffuse increased liver echogenicity most consistent with fatty infiltration. Superimposed inflammation or fibrosis is not excluded. The main portal vein is patent with hepatopetal direction. IMPRESSION: 1. Cholelithiasis without sonographic evidence acute cholecystitis. 2. Fatty liver. Electronically Signed   By: Anner Crete M.D.   On: 01/12/2017 21:28        Scheduled Meds: . amLODipine  10 mg Oral Daily  . clonazePAM  0.5 mg Oral QHS  . metoprolol succinate  50 mg Oral Q2200  . multivitamin with minerals  1 tablet Oral Daily  . pantoprazole  40 mg Oral Daily  . topiramate  50 mg Oral BID   Continuous Infusions:   LOS: 2 days    Time spent: 35 min    [place], DO [facility] Pager [phone]  If 7PM-7AM, please contact night-coverage [email or web address] Password [email or web address] 01/13/2017, 9:20 AM

## 2017-01-13 NOTE — Progress Notes (Signed)
RT placed patient on BIPAP due to SpO2 in low 80s. Patient is tolerating well at this time. RT will monitor as needed.

## 2017-01-13 NOTE — Progress Notes (Signed)
Placed patient on CPAP set at 10cm with fio2 at 50%

## 2017-01-14 ENCOUNTER — Encounter (HOSPITAL_COMMUNITY): Payer: Self-pay | Admitting: Radiology

## 2017-01-14 ENCOUNTER — Inpatient Hospital Stay (HOSPITAL_COMMUNITY): Payer: Medicaid Other

## 2017-01-14 DIAGNOSIS — R079 Chest pain, unspecified: Secondary | ICD-10-CM

## 2017-01-14 DIAGNOSIS — J9621 Acute and chronic respiratory failure with hypoxia: Secondary | ICD-10-CM

## 2017-01-14 DIAGNOSIS — E662 Morbid (severe) obesity with alveolar hypoventilation: Principal | ICD-10-CM

## 2017-01-14 DIAGNOSIS — G4733 Obstructive sleep apnea (adult) (pediatric): Secondary | ICD-10-CM

## 2017-01-14 LAB — BASIC METABOLIC PANEL
ANION GAP: 6 (ref 5–15)
BUN: 24 mg/dL — AB (ref 6–20)
CHLORIDE: 102 mmol/L (ref 101–111)
CO2: 29 mmol/L (ref 22–32)
Calcium: 8.5 mg/dL — ABNORMAL LOW (ref 8.9–10.3)
Creatinine, Ser: 1.43 mg/dL — ABNORMAL HIGH (ref 0.44–1.00)
GFR calc Af Amer: 46 mL/min — ABNORMAL LOW (ref >60)
GFR calc non Af Amer: 40 mL/min — ABNORMAL LOW (ref >60)
GLUCOSE: 109 mg/dL — AB (ref 65–99)
POTASSIUM: 3.9 mmol/L (ref 3.5–5.1)
Sodium: 137 mmol/L (ref 135–145)

## 2017-01-14 LAB — CBC
HEMATOCRIT: 38.2 % (ref 36.0–46.0)
HEMOGLOBIN: 11.6 g/dL — AB (ref 12.0–15.0)
MCH: 30.7 pg (ref 26.0–34.0)
MCHC: 30.4 g/dL (ref 30.0–36.0)
MCV: 101.1 fL — AB (ref 78.0–100.0)
Platelets: 268 10*3/uL (ref 150–400)
RBC: 3.78 MIL/uL — ABNORMAL LOW (ref 3.87–5.11)
RDW: 14.4 % (ref 11.5–15.5)
WBC: 5.2 10*3/uL (ref 4.0–10.5)

## 2017-01-14 MED ORDER — IOPAMIDOL (ISOVUE-370) INJECTION 76%
INTRAVENOUS | Status: AC
  Administered 2017-01-14: 80 mL
  Filled 2017-01-14: qty 100

## 2017-01-14 MED ORDER — POLYETHYLENE GLYCOL 3350 17 G PO PACK
17.0000 g | PACK | Freq: Two times a day (BID) | ORAL | Status: DC
  Administered 2017-01-14: 17 g via ORAL
  Filled 2017-01-14 (×2): qty 1

## 2017-01-14 MED ORDER — METOPROLOL TARTRATE 50 MG PO TABS
50.0000 mg | ORAL_TABLET | Freq: Once | ORAL | Status: AC
  Administered 2017-01-14: 50 mg via ORAL
  Filled 2017-01-14: qty 1

## 2017-01-14 MED ORDER — NITROGLYCERIN 0.4 MG SL SUBL
0.8000 mg | SUBLINGUAL_TABLET | SUBLINGUAL | Status: DC | PRN
  Administered 2017-01-14: 0.8 mg via SUBLINGUAL

## 2017-01-14 MED ORDER — MORPHINE SULFATE (PF) 2 MG/ML IV SOLN: 0.5000 mg | INTRAVENOUS | Status: DC | PRN

## 2017-01-14 MED ORDER — NITROGLYCERIN 0.4 MG SL SUBL
SUBLINGUAL_TABLET | SUBLINGUAL | Status: AC
  Administered 2017-01-14: 0.8 mg via SUBLINGUAL
  Filled 2017-01-14: qty 2

## 2017-01-14 MED ORDER — AZITHROMYCIN 250 MG PO TABS
500.0000 mg | ORAL_TABLET | Freq: Once | ORAL | Status: AC
  Administered 2017-01-14: 500 mg via ORAL
  Filled 2017-01-14: qty 2

## 2017-01-14 MED ORDER — AZITHROMYCIN 250 MG PO TABS
250.0000 mg | ORAL_TABLET | Freq: Every day | ORAL | Status: DC
  Administered 2017-01-15 – 2017-01-16 (×2): 250 mg via ORAL
  Filled 2017-01-14 (×2): qty 1

## 2017-01-14 MED ORDER — BISACODYL 5 MG PO TBEC: 5.0000 mg | DELAYED_RELEASE_TABLET | Freq: Every day | ORAL | Status: DC | PRN

## 2017-01-14 NOTE — Progress Notes (Signed)
PROGRESS NOTE    [name]  PYP:[id number] DOB: Jun 11, 1956 DOA: 01/11/2017 PCP: Berkley Harvey, NP   Outpatient Specialists:     Brief Narrative:  Ann [name] is a 61 y.o. female with multiple medical conditions including anxiety, OSA on CPAP, recurrent UTI's, multiple esophageal dilations done By Dr. Olevia Perches, had apparently some adenomas removed, now presented to [facility] with main concern of 3 days duration of progressively worsening RUQ abd pain that is sharp and occasionally radiating to epigastric area, 7/10 in severity and associated with 3-4 episodes of coffee -ground emesis and dark diarrhea. In addition, she was not able to eat much and has experienced worsening migraine headaches. Per daughter at bedside, she has noted that pt had intermittent and rather significant episodes of LE swelling.  In ER, was found to have hypoxia on RA 85%, requiring placement on oxygen via [place]. Blood work unremarkable except mild rise in Cr 1.08. Etiology of hypoxia unclear and TRH asked to admit for further evaluation.    Assessment & Plan:   Active Problems:   Hypoxia   Dyspnea   Dyspnea and hypoxia - unclear etiology but tachypnea was present on exam at admission -5L O2 weaned to 2L (has not always had a good pleth so not sure how accurate O2 sat has been) so suspect can wean to off -patient is caring for dying husband and under significant stress/?anxiety driven -has had on and of chest pain but cardiac evaluation is unrevealing -CTA with no CAD -CTA negative for PE and does not comment on fluid/pleural effusions - patient was given lasix at admission but has not had significant diuresis and Cr has increased so doubt CHF and lasix was d/c'd - echo shows EF 45-50 and diffuse hypokinesis-- appreciate cardiology evaluation - strict I/O, daily weights -  CE's negative - TSH normal -patient is a non-smoker but has been exposed to 2nd hand smoke -suspect untreated sleep apnea -home O2 eval  today  Untreated OSA -has not worn CPAP in months -ordered here  Headache -?related to untreated sleep apnea vs overmedication -per patient only morphine and benadryl work  HTN, essential - continue Norvasc and Metoprolol - hold losartan and HCTZ for now and resume once Cr back to normal limits   Coffee ground emesis, RUQ pain, bloody diarrhea - unclear etiology - physical exam fairly unremarkable and pt currently denies any pain  - LFT's stable - Hg stable - RUQ U/S- shows stones but nothing acute-- surgery follow up as outpatient  Morbid obesity - Body mass index is 42.51 kg/m.  Hx of kidney stones - recent CT abd noted small stone on left side, nonobstructive - also mild inflammatory changes on right ureter likely from passed stone     DVT prophylaxis:  SCD's  Code Status: Full Code   Family Communication: Daughter at bedside  Disposition Plan:  tx to tele bed on floor with CPAP at night   Consultants:   Cardiology  pulm     Subjective: Headache better with morphine Says she is afraid to eat due to pain in RUQ after eating  Objective: Vitals:   01/14/17 0747 01/14/17 0814 01/14/17 0955 01/14/17 1100  BP:  116/65 95/64 101/73  Pulse:  62 (!) 116 66  Resp:  20    Temp:  98.2 F (36.8 C)  98.4 F (36.9 C)  TempSrc:  Oral  Oral  SpO2: 96% 94% 91% 96%  Weight:      Height:  Intake/Output Summary (Last 24 hours) at 01/14/17 1333 Last data filed at 01/14/17 1322  Gross per 24 hour  Intake              720 ml  Output                0 ml  Net              720 ml   Filed Weights   01/11/17 1059  Weight: 108.9 kg (240 lb)    Examination:  General exam: in bed, NAD Respiratory system: clear after cough, no wheezing Cardiovascular system: rrr, occasional PVC and sinus irregularity Gastrointestinal system: +BS, soft, no RUQ tenderness Central nervous system: A+Ox3, NAD. Extremities: moves all 4 ext- tender to palpation in  LE Skin: No rashes, lesions or ulcers Psychiatry: Judgement and insight appear normal    Data Reviewed: I have personally reviewed following labs and imaging studies  CBC:  Recent Labs Lab 01/11/17 1106 01/12/17 1003 01/13/17 0300 01/14/17 0212  WBC 9.8 4.6 4.4 5.2  HGB 13.7 13.2 12.3 11.6*  HCT 42.0 41.5 39.5 38.2  MCV 98.4 101.7* 103.1* 101.1*  PLT 270 257 268 952   Basic Metabolic Panel:  Recent Labs Lab 01/11/17 1106 01/12/17 0853 01/13/17 0300 01/14/17 0212  NA 139 138 138 137  K 4.0 4.2 3.9 3.9  CL 107 104 98* 102  CO2 24 25 33* 29  GLUCOSE 131* 96 115* 109*  BUN 14 16 17  24*  CREATININE 1.08* 1.18* 1.50* 1.43*  CALCIUM 8.7* 8.7* 8.7* 8.5*   GFR: Estimated Creatinine Clearance: 50.8 mL/min (A) (by C-G formula based on SCr of 1.43 mg/dL (H)). Liver Function Tests:  Recent Labs Lab 01/11/17 1106  AST 22  ALT 19  ALKPHOS 74  BILITOT 1.0  PROT 7.0  ALBUMIN 3.4*    Recent Labs Lab 01/11/17 1106  LIPASE 23   No results for input(s): AMMONIA in the last 168 hours. Coagulation Profile: No results for input(s): INR, PROTIME in the last 168 hours. Cardiac Enzymes:  Recent Labs Lab 01/11/17 2116 01/12/17 0248 01/12/17 0853  TROPONINI <0.03 <0.03 <0.03   BNP (last 3 results) No results for input(s): PROBNP in the last 8760 hours. HbA1C: No results for input(s): HGBA1C in the last 72 hours. CBG: No results for input(s): GLUCAP in the last 168 hours. Lipid Profile: No results for input(s): CHOL, HDL, LDLCALC, TRIG, CHOLHDL, LDLDIRECT in the last 72 hours. Thyroid Function Tests:  Recent Labs  01/11/17 2117  TSH 1.995   Anemia Panel: No results for input(s): VITAMINB12, FOLATE, FERRITIN, TIBC, IRON, RETICCTPCT in the last 72 hours. Urine analysis:    Component Value Date/Time   COLORURINE YELLOW 01/11/2017 1559   APPEARANCEUR CLEAR 01/11/2017 1559   LABSPEC 1.025 01/11/2017 1559   PHURINE 6.0 01/11/2017 1559   GLUCOSEU NEGATIVE  01/11/2017 1559   HGBUR NEGATIVE 01/11/2017 1559   BILIRUBINUR NEGATIVE 01/11/2017 1559   KETONESUR NEGATIVE 01/11/2017 1559   PROTEINUR NEGATIVE 01/11/2017 1559   UROBILINOGEN 0.2 02/09/2015 1322   NITRITE NEGATIVE 01/11/2017 1559   LEUKOCYTESUR NEGATIVE 01/11/2017 1559     ) Recent Results (from the past 240 hour(s))  MRSA PCR Screening     Status: None   Collection Time: 01/11/17 10:24 PM  Result Value Ref Range Status   MRSA by PCR NEGATIVE NEGATIVE Final    Comment:        The GeneXpert MRSA Assay (FDA approved for NASAL specimens only), is  one component of a comprehensive MRSA colonization surveillance program. It is not intended to diagnose MRSA infection nor to guide or monitor treatment for MRSA infections.       Anti-infectives    None       Radiology Studies: Ct Coronary Morph W/cta Cor W/score W/ca W/cm &/or Wo/cm  Addendum Date: 01/14/2017   ADDENDUM REPORT: 01/14/2017 10:27 CLINICAL DATA:  Chest pain EXAM: Cardiac CTA MEDICATIONS: Sub lingual nitro. 4mg  and lopressor 50 mg TECHNIQUE: The patient was scanned on a Siemens Force 035 slice scanner. Gantry rotation speed was 250 msecs. Collimation was.6 mm . A 120 kV prospective scan was triggered in the ascending thoracic aorta at 140 HU's . Average HR during the scan was 62 bpm. The 3D data set was interpreted on a dedicated work station using MPR, MIP and VRT modes. A total of 80cc of contrast was used. FINDINGS: Non-cardiac: See separate report from [facility]. No significant findings on limited lung and soft tissue windows. Calcium Score:0 Coronary Arteries: Right dominant with no anomalies LM: Normal LAD:  Normal D1: Normal D2: Normal Circumflex: Normal OM1: Normal OM2: Normal RCA:  Dominant and normal PDA:  Normal PLA:  Normal IMPRESSION: 1) Normal right dominant coronary arteries 2) Calcium score 0 [place] Electronically Signed   By: [place] M.D.   On: 01/14/2017 10:27   Result Date:  01/14/2017 EXAM: OVER-READ INTERPRETATION  CT CHEST The following report is an over-read performed by radiologist Dr. Rebekah Chesterfield [facility] on 01/14/2017. This over-read does not include interpretation of cardiac or coronary anatomy or pathology. The coronary calcium score/coronary CTA interpretation by the cardiologist is attached. COMPARISON:  Chest CT 01/11/2017. FINDINGS: Within the visualized portions of the thorax there are no suspicious appearing pulmonary nodules or masses, there is no acute consolidative airspace disease, no pleural effusions, no pneumothorax and no lymphadenopathy. Small hiatal hernia. Visualized portions of the upper abdomen are unremarkable. There are no aggressive appearing lytic or blastic lesions noted in the visualized portions of the skeleton. IMPRESSION: 1. Small hiatal hernia. Electronically Signed: By: Vinnie Langton M.D. On: 01/14/2017 09:39   US Abdomen Limited Ruq  Result Date: 01/12/2017 CLINICAL DATA:  61 year old female with right upper quadrant abdominal pain. EXAM: ULTRASOUND ABDOMEN LIMITED RIGHT UPPER QUADRANT COMPARISON:  Abdominal CT dated 12/02/2016 FINDINGS: Gallbladder: There multiple stones within the gallbladder. There is no gallbladder wall thickening or pericholecystic fluid. Negative sonographic Murphy's sign. Common bile duct: Diameter: 4 mm Liver: There is diffuse increased liver echogenicity most consistent with fatty infiltration. Superimposed inflammation or fibrosis is not excluded. The main portal vein is patent with hepatopetal direction. IMPRESSION: 1. Cholelithiasis without sonographic evidence acute cholecystitis. 2. Fatty liver. Electronically Signed   By: Anner Crete M.D.   On: 01/12/2017 21:28        Scheduled Meds: . amLODipine  10 mg Oral Daily  . clonazePAM  0.5 mg Oral QHS  . metoprolol succinate  50 mg Oral Q2200  . metoprolol tartrate  50 mg Oral ONCE-1800  . multivitamin with minerals  1 tablet  Oral Daily  . pantoprazole  40 mg Oral Daily  . topiramate  50 mg Oral BID   Continuous Infusions:   LOS: 3 days    Time spent: 35 min    [place], DO [facility] Pager [phone]  If 7PM-7AM, please contact night-coverage [email or web address] Password [email or web address] 01/14/2017, 1:33 PM

## 2017-01-14 NOTE — Progress Notes (Signed)
CM Referral for home BIPAP.  Referral to [facility] for home BIPAP machine; [facility] to evaluate.  CM will follow up with DME agency in AM.    Reinaldo Raddle, RN, BSN  Trauma/Neuro ICU Case Manager [phone]

## 2017-01-14 NOTE — Progress Notes (Signed)
Progress Note  Patient Name: Ann [name] Date of Encounter: 01/14/2017  Primary Cardiologist: Johnsie Cancel, requested Leroi Haque  Subjective   No CP. De sats at night. Chronic knee pain.   Inpatient Medications    Scheduled Meds: . amLODipine  10 mg Oral Daily  . clonazePAM  0.5 mg Oral QHS  . metoprolol succinate  50 mg Oral Q2200  . metoprolol tartrate  50 mg Oral ONCE-1800  . multivitamin with minerals  1 tablet Oral Daily  . pantoprazole  40 mg Oral Daily  . topiramate  50 mg Oral BID   Continuous Infusions:  PRN Meds: acetaminophen, albuterol, butalbital-acetaminophen-caffeine, diphenhydrAMINE, fluticasone, morphine injection, nitroGLYCERIN, ondansetron (ZOFRAN) IV, oxyCODONE   Vital Signs    Vitals:   01/14/17 0747 01/14/17 0814 01/14/17 0955 01/14/17 1100  BP:  116/65 95/64 101/73  Pulse:  62 (!) 116 66  Resp:  20    Temp:  98.2 F (36.8 C)  98.4 F (36.9 C)  TempSrc:  Oral  Oral  SpO2: 96% 94% 91% 96%  Weight:      Height:        Intake/Output Summary (Last 24 hours) at 01/14/17 1122 Last data filed at 01/13/17 1836  Gross per 24 hour  Intake              480 ml  Output                0 ml  Net              480 ml   Filed Weights   01/11/17 1059  Weight: 240 lb (108.9 kg)    Telemetry    NSR, PAC - Personally Reviewed  ECG    NSR with PAC, 79 - Personally Reviewed  Physical Exam   GEN: No acute distress.  Obese Neck: No JVD Cardiac: RRR, no murmurs, rubs, or gallops.  Respiratory: Clear to auscultation bilaterally. GI: Soft, nontender, non-distended  MS: No edema; No deformity. Neuro:  Nonfocal  Psych: Normal affect   Labs    Chemistry Recent Labs Lab 01/11/17 1106 01/12/17 0853 01/13/17 0300 01/14/17 0212  NA 139 138 138 137  K 4.0 4.2 3.9 3.9  CL 107 104 98* 102  CO2 24 25 33* 29  GLUCOSE 131* 96 115* 109*  BUN 14 16 17  24*  CREATININE 1.08* 1.18* 1.50* 1.43*  CALCIUM 8.7* 8.7* 8.7* 8.5*  PROT 7.0  --   --   --   ALBUMIN  3.4*  --   --   --   AST 22  --   --   --   ALT 19  --   --   --   ALKPHOS 74  --   --   --   BILITOT 1.0  --   --   --   GFRNONAA 55* 50* 37* 40*  GFRAA >60 58* 43* 46*  ANIONGAP 8 9 7 6      Hematology Recent Labs Lab 01/12/17 1003 01/13/17 0300 01/14/17 0212  WBC 4.6 4.4 5.2  RBC 4.08 3.83* 3.78*  HGB 13.2 12.3 11.6*  HCT 41.5 39.5 38.2  MCV 101.7* 103.1* 101.1*  MCH 32.4 32.1 30.7  MCHC 31.8 31.1 30.4  RDW 14.9 14.9 14.4  PLT 257 268 268    Cardiac Enzymes Recent Labs Lab 01/11/17 2116 01/12/17 0248 01/12/17 0853  TROPONINI <0.03 <0.03 <0.03    Recent Labs Lab 01/11/17 1722  TROPIPOC 0.00     BNP  Recent Labs Lab 01/11/17 1106  BNP 10.4     DDimer No results for input(s): DDIMER in the last 168 hours.   Radiology    Ct Coronary Morph W/cta Cor W/score W/ca W/cm &/or Wo/cm  Addendum Date: 01/14/2017   ADDENDUM REPORT: 01/14/2017 10:27 CLINICAL DATA:  Chest pain EXAM: Cardiac CTA MEDICATIONS: Sub lingual nitro. 4mg  and lopressor 50 mg TECHNIQUE: The patient was scanned on a Siemens Force 195 slice scanner. Gantry rotation speed was 250 msecs. Collimation was.6 mm . A 120 kV prospective scan was triggered in the ascending thoracic aorta at 140 HU's . Average HR during the scan was 62 bpm. The 3D data set was interpreted on a dedicated work station using MPR, MIP and VRT modes. A total of 80cc of contrast was used. FINDINGS: Non-cardiac: See separate report from [facility]. No significant findings on limited lung and soft tissue windows. Calcium Score:0 Coronary Arteries: Right dominant with no anomalies LM: Normal LAD:  Normal D1: Normal D2: Normal Circumflex: Normal OM1: Normal OM2: Normal RCA:  Dominant and normal PDA:  Normal PLA:  Normal IMPRESSION: 1) Normal right dominant coronary arteries 2) Calcium score 0 [place] Electronically Signed   By: Jenkins Rouge M.D.   On: 01/14/2017 10:27   Result Date: 01/14/2017 EXAM: OVER-READ INTERPRETATION   CT CHEST The following report is an over-read performed by radiologist Dr. Rebekah Chesterfield [facility] on 01/14/2017. This over-read does not include interpretation of cardiac or coronary anatomy or pathology. The coronary calcium score/coronary CTA interpretation by the cardiologist is attached. COMPARISON:  Chest CT 01/11/2017. FINDINGS: Within the visualized portions of the thorax there are no suspicious appearing pulmonary nodules or masses, there is no acute consolidative airspace disease, no pleural effusions, no pneumothorax and no lymphadenopathy. Small hiatal hernia. Visualized portions of the upper abdomen are unremarkable. There are no aggressive appearing lytic or blastic lesions noted in the visualized portions of the skeleton. IMPRESSION: 1. Small hiatal hernia. Electronically Signed: By: Vinnie Langton M.D. On: 01/14/2017 09:39   US Abdomen Limited Ruq  Result Date: 01/12/2017 CLINICAL DATA:  60 year old female with right upper quadrant abdominal pain. EXAM: ULTRASOUND ABDOMEN LIMITED RIGHT UPPER QUADRANT COMPARISON:  Abdominal CT dated 12/02/2016 FINDINGS: Gallbladder: There multiple stones within the gallbladder. There is no gallbladder wall thickening or pericholecystic fluid. Negative sonographic Murphy's sign. Common bile duct: Diameter: 4 mm Liver: There is diffuse increased liver echogenicity most consistent with fatty infiltration. Superimposed inflammation or fibrosis is not excluded. The main portal vein is patent with hepatopetal direction. IMPRESSION: 1. Cholelithiasis without sonographic evidence acute cholecystitis. 2. Fatty liver. Electronically Signed   By: Anner Crete M.D.   On: 01/12/2017 21:28    Cardiac Studies   CTA as above of cors - NORMAL  Patient Profile     61 y.o. female with atypical CP, SOB, obese with normal CT of cors.   Assessment & Plan     - Reassuring CTA. NO CAD.   - Continue to encourage weight loss. Conditioning. Awaiting knee  replacement. Trying to loose weight prior (10 pounds)  - SOB more obesity related. EF minimally reduced. Should not be of any significant clinical consequence.   - No need for cardiology follow up. Consider pulm eval. Desats at night.   - Continue to improve BP control which can be labile. If wakes up at 3am with severe HA, BP is elevated. ?pain provocation.   Will sign off.   Signed, Candee Furbish, MD  01/14/2017, 11:22 AM

## 2017-01-14 NOTE — Evaluation (Signed)
Physical Therapy Evaluation Patient Details Name: Ann [name] MRN: [id number] DOB: 1955-12-13 Today's Date: 01/14/2017   History of Present Illness  Ann [name] is a 61 y.o. female with a hx of OSA not wearing CPAP, anxiety, esophageal strictures with multiple dilatations, remote nuc studies neg for ischemia 2007,  HTN,  HLD, and morbid obesity who was seen 01/11/17 for the evaluation of hypoxia and dyspnea, and decreased EF at 45-50% at the request of Dr. Eliseo Squires.  Pt admitted with Rt upper quad pain and nausea and vomiting of coffee-ground emesis.   troponins neg.  Clinical Impression  Pt admitted with above diagnosis. Pt currently with functional limitations due to the deficits listed below (see PT Problem List). Pt had a bad headache therefore bed level assessment today.  Pt will need equipment as below and wants to go home and does not want to go to SNF for therapy.  Pt understands she will most likely not get therapy at home.  Will follow acutely and progress pt as able.  Will probably need an O2 sat assessment when she can be more mobile.  Limited by severe headaches today.  Pt will benefit from skilled PT to increase their independence and safety with mobility to allow discharge to the venue listed below.     Follow Up Recommendations No PT follow up;Supervision/Assistance - 24 hour (Pt currently refusing SNF, will not qualify for HHPT)    Equipment Recommendations  Other (comment) (rollator with seat)    Recommendations for Other Services OT consult (for adaptive equipment and shower equipment)     Precautions / Restrictions Precautions Precautions: Fall Restrictions Weight Bearing Restrictions: No      Mobility  Bed Mobility Overal bed mobility: Independent             General bed mobility comments: can roll right and left.  Min guard to EOB  Transfers                 General transfer comment: TBA at later date. HEadache too severe for pt to  continue.  Ambulation/Gait             General Gait Details: Unable to day  Stairs            Wheelchair Mobility    Modified Rankin (Stroke Patients Only)       Balance Overall balance assessment:  (TBA)                                           Pertinent Vitals/Pain Pain Assessment: 0-10 Pain Score: 10-Worst pain ever Pain Location: head Pain Descriptors / Indicators: Aching Pain Intervention(s): Limited activity within patient's tolerance;Monitored during session;Repositioned;Patient requesting pain meds-RN notified    Home Living Family/patient expects to be discharged to:: Private residence Living Arrangements: Spouse/significant other Available Help at Discharge: Family;Available 24 hours/day Type of Home: House Home Access: Ramped entrance     Home Layout: One level Home Equipment: Wheelchair - manual      Prior Function Level of Independence: Needs assistance   Gait / Transfers Assistance Needed: Pt had to hold onto furniture and limit distance  ADL's / Homemaking Assistance Needed: Pt needed some assist with ADLS  Comments: Pt is awaiting bil TKA but has to lose 10 more pounds per pt and daughter.     Hand Dominance  Extremity/Trunk Assessment   Upper Extremity Assessment Upper Extremity Assessment: Defer to OT evaluation    Lower Extremity Assessment Lower Extremity Assessment: Generalized weakness (pain severe in knees due to awaiting bil TKAs)    Cervical / Trunk Assessment Cervical / Trunk Assessment: Kyphotic  Communication   Communication: No difficulties  Cognition Arousal/Alertness: Awake/alert Behavior During Therapy: Flat affect Overall Cognitive Status: Within Functional Limits for tasks assessed                                        General Comments      Exercises     Assessment/Plan    PT Assessment Patient needs continued PT services  PT Problem List Decreased  balance;Decreased mobility;Decreased activity tolerance;Decreased range of motion;Decreased strength;Decreased knowledge of use of DME;Decreased safety awareness;Decreased knowledge of precautions;Pain       PT Treatment Interventions DME instruction;Gait training;Functional mobility training;Therapeutic activities;Therapeutic exercise;Balance training;Patient/family education    PT Goals (Current goals can be found in the Care Plan section)  Acute Rehab PT Goals Patient Stated Goal: to go home with family PT Goal Formulation: With patient Time For Goal Achievement: 01/28/17 Potential to Achieve Goals: Good    Frequency Min 3X/week   Barriers to discharge        Co-evaluation               AM-PAC PT "6 Clicks" Daily Activity  Outcome Measure Difficulty turning over in bed (including adjusting bedclothes, sheets and blankets)?: None Difficulty moving from lying on back to sitting on the side of the bed? : A Little Difficulty sitting down on and standing up from a chair with arms (e.g., wheelchair, bedside commode, etc,.)?: A Little Help needed moving to and from a bed to chair (including a wheelchair)?: A Lot Help needed walking in hospital room?: Total Help needed climbing 3-5 steps with a railing? : Total 6 Click Score: 14    End of Session Equipment Utilized During Treatment: Oxygen Activity Tolerance: Patient limited by fatigue Patient left: with call bell/phone within reach;in bed Nurse Communication: Mobility status;Patient requests pain meds PT Visit Diagnosis: Unsteadiness on feet (R26.81);Muscle weakness (generalized) (M62.81);Pain Pain - Right/Left:  (bil) Pain - part of body: Knee (Head)    Time: 8413-2440 PT Time Calculation (min) (ACUTE ONLY): 10 min   Charges:   PT Evaluation $PT Eval Moderate Complexity: 1 Procedure     PT G Codes:        Ann [name],PT Acute Rehabilitation [phone] (pager)   Denice Paradise 01/14/2017, 1:08  PM

## 2017-01-14 NOTE — Progress Notes (Signed)
RT came to assess patient and place on BIPAP HS. Patient is currently on BIPAP tolerating well.

## 2017-01-14 NOTE — Consult Note (Signed)
Name: Ann [name] MRN: [id number] DOB: Jul 28, 1955    ADMISSION DATE:  01/11/2017 CONSULTATION DATE:  01/14/2017  REFERRING MD :  Dr. Eliseo Squires  CHIEF COMPLAINT:  Hypoxia  HISTORY OF PRESENT ILLNESS:   61 year old female with PMH as listed below significant for but not limited to obesity, OSA- non compliant with CPAP, chronic cough, rhinitis, GERD, esophageal strictures with multiple dilations admitted on 7/8 with hypoxia and dyspnea.  Of note, last sleep study noted in 2014 by Dr. Halford Chessman.  Last seen in our office for cough by Dr. Melvyn Novas in 2016.   09/2012.  She reports she doesn't wear her CPAP because it suffocates her.  She complains of ongoing exertional dyspnea (for months), lower leg swelling, daytime sleepiness, and recent increase in headaches.  She sleeps in a chair.  Her daughter says she falls asleep during conversations mid-day.  She reports her husband has been very sick and daughter states she has neglected her health to take care of him.  Her mobility is further limited by chronic knee pain.  She is a never smoker but exposed to second-hand smoke via her husband.    Originally, she presented this admission with progressive three day history of RUQ/ epigastric pain with coffee-ground emesis, dark diarrhea, and headache.  Admits to chills on Saturday and productive greenish cough.  She was found to have room air saturations of 85% on room air.  CTA chest was negative for PE.  Abdominal ultrasound negative for acute process.  She required BiPAP at night.  Echo on 7/9 showed EF 45-50% and therefore cardiology was consulted.  She had a reassuring cardiac CTA with recommendations for weight loss and improved blood pressure control.  She has continued to desaturate at night and after pain medications, requiring CPAP while sleeping and nasal cannula during the day as needed.  ABG shows chronic respiratory acidosis.  PCCM consulted ongoing hypoxia thought to be pulmonary related.      PAST MEDICAL  HISTORY :   has a past medical history of Allergy; Anemia; Anxiety; Arthritis; Borderline diabetes; Chronic cystitis; Complication of anesthesia; Diverticulosis of colon; Family history of adverse reaction to anesthesia; GERD (gastroesophageal reflux disease); H/O hiatal hernia; Headache(784.0); History of chronic cough; History of esophageal dilatation; History of MRSA infection; History of recurrent UTIs; History of TIAs (NO RESIDUAL); Hyperlipidemia; Hypertension; Kidney disease; Neuromuscular disorder ([place]); Neuropathy; Nocturia; OSA on CPAP; Sleep apnea; Stroke ([place]); and SUI (stress urinary incontinence, female).  has a past surgical history that includes Knee arthroscopy (Bilateral, 2008); Esophageal dilation (X2  LAST ONE  JUNE 2014); Tubal ligation (1987); Anterior and posterior repair (N/A, 06/06/2013); Rectocele repair (N/A, 02/20/2014); Cystoscopy w/ ureteral stent placement (Right, 04/21/2014); Cystoscopy with retrograde pyelogram, ureteroscopy and stent placement (Right, 06/05/2014); Holmium laser application (Right, 93/79/0240); Cystoscopy w/ ureteral stent placement (Right, 11/21/2014); Esophageal manometry (N/A, 09/10/2015); Multiple tooth extractions; and Cystoscopy w/ ureteral stent placement (Left, 02/01/2016).   Prior to Admission medications   Medication Sig Start Date End Date Taking? Authorizing Provider  albuterol (PROAIR HFA) 108 (90 Base) MCG/ACT inhaler Inhale 2 puffs into the lungs every 4 (four) hours as needed. For cough wheezing. 08/07/15  Yes [provider]  amLODipine (NORVASC) 10 MG tablet Take 10 mg by mouth daily.    Yes [provider]  chlorpheniramine (CHLOR-TRIMETON) 4 MG tablet Take 1 tablet (4 mg total) by mouth at bedtime. 03/08/15  Yes Chesley Mires, MD  clonazePAM (KLONOPIN) 0.5 MG tablet Take 0.5 mg by  mouth at bedtime.    Yes [provider]  furosemide (LASIX) 40 MG tablet Take 40 mg by mouth daily as needed for fluid.  03/06/15  Yes  [provider]  losartan-hydrochlorothiazide (HYZAAR) 50-12.5 MG tablet Take 1 tablet by mouth at bedtime. 12/16/16 12/16/17 Yes [provider]  metoprolol succinate (TOPROL-XL) 50 MG 24 hr tablet Take 50 mg by mouth daily. 08/08/15  Yes [provider]  omeprazole (PRILOSEC) 40 MG capsule TAKE 1 CAPSULE(40 MG) BY MOUTH TWICE DAILY 09/17/16  Yes Armbruster, Renelda Loma, MD  polyethylene glycol powder (GLYCOLAX/MIRALAX) powder MIX 1 TABLESPOONFUL IN LIQUID AND TK PO QD 02/26/16  Yes [provider]  topiramate (TOPAMAX) 50 MG tablet Take 50 mg by mouth 2 (two) times daily.   Yes [provider]  albuterol (PROVENTIL) (2.5 MG/3ML) 0.083% nebulizer solution Inhale 3 mLs into the lungs every 6 (six) hours as needed. Wheezing/shortness of breath 08/07/15   [provider]  fluticasone (FLONASE) 50 MCG/ACT nasal spray Place 2 sprays into both nostrils daily. Patient taking differently: Place 2 sprays into both nostrils daily as needed for allergies.  03/08/15   Chesley Mires, MD  Multiple Vitamin (MULTIVITAMIN WITH MINERALS) TABS tablet Take 1 tablet by mouth daily.    [provider]  ranitidine (ZANTAC) 150 MG tablet Take 1 tablet (150 mg total) by mouth 2 (two) times daily. Patient not taking: Reported on 01/11/2017 08/26/16   Armbruster, Renelda Loma, MD   Allergies  Allergen Reactions  . Doxycycline Diarrhea  . Metronidazole Other (See Comments)    Severe headaches    FAMILY HISTORY:  family history includes Colon cancer (age of onset: 27) in her father; Heart attack in her mother and sister; Heart attack (age of onset: 54) in her cousin; Heart attack (age of onset: 74) in her cousin; Heart disease in her mother; Hypertension in her brother. SOCIAL HISTORY:  reports that she has never smoked. She has never used smokeless tobacco. She reports that she does not drink alcohol or use drugs.  REVIEW OF SYSTEMS:  POSITIVES IN BOLD  Constitutional:  Negative for fever, chills, weight loss, malaise/fatigue and diaphoresis.  HENT: Negative for hearing loss, ear pain, nosebleeds, congestion, sore throat, neck pain, tinnitus and ear discharge.   Eyes: Negative for blurred vision, double vision, photophobia, pain, discharge and redness.  Respiratory: Negative for cough, hemoptysis, sputum production, exertional shortness of breath, wheezing and stridor.   Cardiovascular: Negative for chest pain, palpitations, orthopnea, claudication, leg swelling and PND.  Gastrointestinal: Negative for heartburn, nausea, vomiting, abdominal pain, diarrhea, constipation, blood in stool and melena.  Genitourinary: Negative for dysuria, urgency, frequency, hematuria and flank pain.  Musculoskeletal: Negative for myalgias, back pain, joint pain and falls. Right knee pain  Skin: Negative for itching and rash.  Neurological: Negative for dizziness, tingling, tremors, sensory change, speech change, focal weakness, seizures, loss of consciousness, weakness and headaches.  Endo/Heme/Allergies: Negative for environmental allergies and polydipsia. Does not bruise/bleed easily.  SUBJECTIVE:  Complains of continued headache and exertional dyspnea to the bathroom.  VITAL SIGNS: Temp:  [98.1 F (36.7 C)-98.6 F (37 C)] 98.4 F (36.9 C) (07/11 1100) Pulse Rate:  [50-116] 66 (07/11 1100) Resp:  [13-20] 20 (07/11 0814) BP: (90-116)/(47-73) 101/73 (07/11 1100) SpO2:  [88 %-100 %] 96 % (07/11 1100) FiO2 (%):  [50 %] 50 % (07/11 0731)  PHYSICAL EXAMINATION: General:  Adult obese female sitting in bed in NAD HEENT: MM pink/moist, sclerae anicteric, thick neck  Neuro: Alert and oriented, MAE, non focal  CV: s1s2 rrr, no m/r/g PULM: even/non-labored, lungs bilaterally clear, slightly diminished in bases, 93% on 2L GI: obese, soft, non-tender, bs active  Extremities: warm/dry, no edema  Skin: no rashes or lesions   Recent Labs Lab 01/12/17 0853 01/13/17 0300  01/14/17 0212  NA 138 138 137  K 4.2 3.9 3.9  CL 104 98* 102  CO2 25 33* 29  BUN 16 17 24*  CREATININE 1.18* 1.50* 1.43*  GLUCOSE 96 115* 109*    Recent Labs Lab 01/12/17 1003 01/13/17 0300 01/14/17 0212  HGB 13.2 12.3 11.6*  HCT 41.5 39.5 38.2  WBC 4.6 4.4 5.2  PLT 257 268 268   Ct Coronary Morph W/cta Cor W/score W/ca W/cm &/or Wo/cm  Addendum Date: 01/14/2017   ADDENDUM REPORT: 01/14/2017 10:27 CLINICAL DATA:  Chest pain EXAM: Cardiac CTA MEDICATIONS: Sub lingual nitro. 4mg  and lopressor 50 mg TECHNIQUE: The patient was scanned on a Siemens Force 378 slice scanner. Gantry rotation speed was 250 msecs. Collimation was.6 mm . A 120 kV prospective scan was triggered in the ascending thoracic aorta at 140 HU's . Average HR during the scan was 62 bpm. The 3D data set was interpreted on a dedicated work station using MPR, MIP and VRT modes. A total of 80cc of contrast was used. FINDINGS: Non-cardiac: See separate report from [facility]. No significant findings on limited lung and soft tissue windows. Calcium Score:0 Coronary Arteries: Right dominant with no anomalies LM: Normal LAD:  Normal D1: Normal D2: Normal Circumflex: Normal OM1: Normal OM2: Normal RCA:  Dominant and normal PDA:  Normal PLA:  Normal IMPRESSION: 1) Normal right dominant coronary arteries 2) Calcium score 0 [place] Electronically Signed   By: [place] Rouge M.D.   On: 01/14/2017 10:27   Result Date: 01/14/2017 EXAM: OVER-READ INTERPRETATION  CT CHEST The following report is an over-read performed by radiologist Dr. Rebekah [facility] on 01/14/2017. This over-read does not include interpretation of cardiac or coronary anatomy or pathology. The coronary calcium score/coronary CTA interpretation by the cardiologist is attached. COMPARISON:  Chest CT 01/11/2017. FINDINGS: Within the visualized portions of the thorax there are no suspicious appearing pulmonary nodules or masses, there is no  acute consolidative airspace disease, no pleural effusions, no pneumothorax and no lymphadenopathy. Small hiatal hernia. Visualized portions of the upper abdomen are unremarkable. There are no aggressive appearing lytic or blastic lesions noted in the visualized portions of the skeleton. IMPRESSION: 1. Small hiatal hernia. Electronically Signed: By: Vinnie Langton M.D. On: 01/14/2017 09:39   US Abdomen Limited Ruq  Result Date: 01/12/2017 CLINICAL DATA:  61 year old female with right upper quadrant abdominal pain. EXAM: ULTRASOUND ABDOMEN LIMITED RIGHT UPPER QUADRANT COMPARISON:  Abdominal CT dated 12/02/2016 FINDINGS: Gallbladder: There multiple stones within the gallbladder. There is no gallbladder wall thickening or pericholecystic fluid. Negative sonographic Murphy's sign. Common bile duct: Diameter: 4 mm Liver: There is diffuse increased liver echogenicity most consistent with fatty infiltration. Superimposed inflammation or fibrosis is not excluded. The main portal vein is patent with hepatopetal direction. IMPRESSION: 1. Cholelithiasis without sonographic evidence acute cholecystitis. 2. Fatty liver. Electronically Signed   By: Anner Crete M.D.   On: 01/12/2017 21:28   STUDIES:  PFT 09/14/12 >> FEV1 1.88 (78%), FEV1% 78, TLC 3.95 (80%), DLCO 92%, no BD  7/8 CTA chest >> no acute intrathoracic pathology.  Neg for PE 7/9 TTE >> EF 45-50%, mild LVH, diffuse hypokinesis, G1DD,  trace MR and TR (PAP not specified) 7/9 [place] abd limited >> cholelithiasis without sonographic evidence of acute cholecystitis; fatty liver 7/11 cardiac CTA >> normal right dominant coronary arteries  ASSESSMENT / PLAN: Acute on chronic hypoxic/hypercapnic respiratory failure - multifactorial  OSA- noncomplaint with CAP Suspected OHS  Tracheobronchomalacia Obesity- BMI 42.5 GERD Chronic cough/ acute bronchitis with productive cough - based on chest CTA, neg for PE but noted for tracheobronchomalacia - cleared by  Cards for cardiac origin of SOB ruled out, neg for CAD via cardiac CT, EF 45-50% Plan: D/c CPAP and change to BiPAP to help with hypercarbia Mandatory nocturnal BiPAP and PRN when napping CSW consult for patient to go home on BiPAP for chronic resting hypercapnia  Wean O2 for goal oxygen saturation 88-95% Azithromycin 500mg  now, then 250mg  x 4 days  Pulmonary hygiene with flutter valve  Minimize sedative medications as able Ambulatory desaturation study prior to discharge to determine daytime O2 needs  Will need outpatient pulmonary follow up for repeat sleep study and PFTs, possible HRCT Encourage weight-loss  Continue with home regimen for GERD and rhinitis   Rest per primary team   Kennieth Rad, AGACNP-BC [place] Pgr: [phone] or if no answer [phone] 01/14/2017, 2:15 PM

## 2017-01-15 ENCOUNTER — Other Ambulatory Visit (HOSPITAL_BASED_OUTPATIENT_CLINIC_OR_DEPARTMENT_OTHER): Payer: Self-pay

## 2017-01-15 DIAGNOSIS — R079 Chest pain, unspecified: Secondary | ICD-10-CM

## 2017-01-15 DIAGNOSIS — R0600 Dyspnea, unspecified: Secondary | ICD-10-CM

## 2017-01-15 DIAGNOSIS — R5383 Other fatigue: Secondary | ICD-10-CM

## 2017-01-15 DIAGNOSIS — R0902 Hypoxemia: Secondary | ICD-10-CM

## 2017-01-15 DIAGNOSIS — G4730 Sleep apnea, unspecified: Secondary | ICD-10-CM

## 2017-01-15 DIAGNOSIS — R0683 Snoring: Secondary | ICD-10-CM

## 2017-01-15 LAB — BASIC METABOLIC PANEL
ANION GAP: 8 (ref 5–15)
BUN: 17 mg/dL (ref 6–20)
CALCIUM: 8.6 mg/dL — AB (ref 8.9–10.3)
CO2: 24 mmol/L (ref 22–32)
Chloride: 105 mmol/L (ref 101–111)
Creatinine, Ser: 1.11 mg/dL — ABNORMAL HIGH (ref 0.44–1.00)
GFR, EST NON AFRICAN AMERICAN: 54 mL/min — AB (ref >60)
GLUCOSE: 121 mg/dL — AB (ref 65–99)
Potassium: 4.3 mmol/L (ref 3.5–5.1)
Sodium: 137 mmol/L (ref 135–145)

## 2017-01-15 LAB — CBC
HCT: 40.8 % (ref 36.0–46.0)
Hemoglobin: 12.7 g/dL (ref 12.0–15.0)
MCH: 31.2 pg (ref 26.0–34.0)
MCHC: 31.1 g/dL (ref 30.0–36.0)
MCV: 100.2 fL — ABNORMAL HIGH (ref 78.0–100.0)
PLATELETS: 281 10*3/uL (ref 150–400)
RBC: 4.07 MIL/uL (ref 3.87–5.11)
RDW: 14.5 % (ref 11.5–15.5)
WBC: 5.1 10*3/uL (ref 4.0–10.5)

## 2017-01-15 MED ORDER — FUROSEMIDE 10 MG/ML IJ SOLN
INTRAMUSCULAR | Status: AC
  Filled 2017-01-15: qty 4

## 2017-01-15 NOTE — Care Management Note (Signed)
Case Management Note  Original Note BY:   Maryclare Labrador, RN 01/15/2017, 11:54 AM  Patient Details  Name: Ann [name] MRN: [id number] Date of Birth: 04-05-1956  Subjective/Objective:  Pt admitted with abd pain and coffee ground emesis               Action/Plan:   PTA from home.  Pt has some social issues including sickly husband (CHF) active [place] for [facility].  Pt states she has to hold on to items to get around in the home - has no equipment.  Pt states she has been told by two doctors that she needs ortho surgery but must loose weight first.  PT eval ordered for equipment needs as pt refuses SNF regardless of recommendation   Expected Discharge Date:                  Expected Discharge Plan:  [place]  In-House Referral:     Discharge planning Services  CM Consult  Post Acute Care Choice:    Choice offered to:  Patient  DME Arranged:  Bipap, Walker rolling, Oxygen DME Agency:  [place]:    [place] Agency:     Status of Service:  Completed, signed off  If discussed at [place] of Stay Meetings, dates discussed:    Additional Comments: [facility] accepted DME referral - will arrange BIPAP once LOG is received.  Requested Pulm Ambulation test  Choice given for BIPAP - pt chose [facility].  BIPAP referral given to [facility].  Pt states she has sleep study within the last 3 years.  Pt also per PT note will likely need ambulation test for oxygen prior to discharge - CM will inform [facility] of pending referral.  01/15/17 J. Zac Torti, RN, BSN Pt approved for BIPAP with LOG for 7 days (until sleep study done on 01/22/17).  LOG given to Janae Sauce with [facility].  Received order for home oxygen with qualifying sats; [facility] aware of O2 need and will deliver oxygen tank and RW to patient's room upon dc on 01/16/17.  [facility] respiratory will follow up with pt at home after dc on 7/13 to deliver BIPAP and supplies.    Reinaldo Raddle, RN, BSN  Trauma/Neuro ICU Case  Manager [phone]

## 2017-01-15 NOTE — Care Management Note (Addendum)
Case Management Note  Patient Details  Name: Ann [name] MRN: [id number] Date of Birth: 01/08/1956  Subjective/Objective:  Pt admitted with abd pain and coffee ground emesis               Action/Plan:   PTA from home.  Pt has some social issues including sickly husband (CHF) active [place] for [facility].  Pt states she has to hold on to items to get around in the home - has no equipment.  Pt states she has been told by two doctors that she needs ortho surgery but must loose weight first.  PT eval ordered for equipment needs as pt refuses SNF regardless of recommendation   Expected Discharge Date:                  Expected Discharge Plan:  [name]  In-House Referral:     Discharge planning Services  CM Consult  Post Acute Care Choice:    Choice offered to:  Patient  DME Arranged:  Ocie [place]ne rolling DME Agency:  [place]:    [place] Agency:     Status of Service:  In process, will continue to follow  If discussed at [place] of Stay Meetings, dates discussed:    Additional Comments: [facility] accepted DME referral - will arrange BIPAP once LOG is received.  Requested Pulm Ambulation test  Choice given for BIPAP - pt chose [facility].  BIPAP referral given to [facility].  Pt states she has sleep study within the last 3 years.  Pt also per PT note will likely need ambulation test for oxygen prior to discharge - CM will inform [facility] of pending referral. Maryclare Labrador, RN 01/15/2017, 11:54 AM

## 2017-01-15 NOTE — Progress Notes (Signed)
Physical Therapy Treatment Patient Details Name: Ann [name] MRN: [id number] DOB: 11-14-1955 Today's Date: 01/15/2017    History of Present Illness Ann [name] is a 61 y.o. female with a hx of OSA not wearing CPAP, anxiety, esophageal strictures with multiple dilatations, remote nuc studies neg for ischemia 2007,  HTN,  HLD, and morbid obesity who was seen 01/11/17 for the evaluation of hypoxia and dyspnea, and decreased EF at 45-50% at the request of Dr. Eliseo Squires.  Pt admitted with Rt upper quad pain and nausea and vomiting of coffee-ground emesis.   troponins neg.    PT Comments    Patient progressing well towards PT goals. Tolerated gait training today with use of RW for support but demonstrated 2-3/4 DOE and Sp02 dropped to 85% on RA. Able to quickly recover within 1 min with seated rest break and cues for breathing.  Lengthy discussion re: fall reduction at home, using RW and short distance ambulation. Will follow acutely to maximize independence and mobility prior to return home.   Follow Up Recommendations  No PT follow up;Supervision/Assistance - 24 hour     Equipment Recommendations  Other (comment) (rollator with seat)    Recommendations for Other Services OT consult     Precautions / Restrictions Precautions Precautions: Fall Precaution Comments: arthritic knees Restrictions Weight Bearing Restrictions: No    Mobility  Bed Mobility Overal bed mobility: Modified Independent             General bed mobility comments: ABle to get to EOB without assist, increased time.  Transfers Overall transfer level: Needs assistance Equipment used: None;Rolling walker (2 wheeled) Transfers: Sit to/from Stand Sit to Stand: Supervision         General transfer comment: Supervision for safety; slowt o stand due to knee discomfort. Stood from [employer], from toilet x1, from chair x2.  Ambulation/Gait Ambulation/Gait assistance: Min guard Ambulation Distance (Feet): 100 Feet  (+60') Assistive device: Rolling walker (2 wheeled) Gait Pattern/deviations: Step-to pattern;Step-through pattern;Decreased stride length;Trunk flexed Gait velocity: decreased Gait velocity interpretation: Below normal speed for age/gender General Gait Details: Slow, unsteady gait with RW for safety. 2-3/4 DOE. 2 seated rest breaks. Sp02 decreased to 85% on RA but quickly recovered after seated rest break. Able to maintain >88% at rest.    Stairs            Wheelchair Mobility    Modified Rankin (Stroke Patients Only)       Balance Overall balance assessment: Needs assistance;History of Falls Sitting-balance support: Feet supported;No upper extremity supported Sitting balance-Leahy Scale: Good     Standing balance support: During functional activity;Bilateral upper extremity supported Standing balance-Leahy Scale: Poor Standing balance comment: Reliant on UEs for support in standing however able to navigate room furniture walking.                            Cognition Arousal/Alertness: Awake/alert Behavior During Therapy: WFL for tasks assessed/performed Overall Cognitive Status: Within Functional Limits for tasks assessed                                        Exercises      General Comments General comments (skin integrity, edema, etc.): Daughter present during session.      Pertinent Vitals/Pain Pain Assessment: Faces Faces Pain Scale: Hurts even more Pain Location: head Pain Descriptors /  Indicators: Aching Pain Intervention(s): Monitored during session;Repositioned;Limited activity within patient's tolerance    Home Living                      Prior Function            PT Goals (current goals can now be found in the care plan section) Progress towards PT goals: Progressing toward goals    Frequency    Min 3X/week      PT Plan Current plan remains appropriate    Co-evaluation              AM-PAC  PT "6 Clicks" Daily Activity  Outcome Measure  Difficulty turning over in bed (including adjusting bedclothes, sheets and blankets)?: None Difficulty moving from lying on back to sitting on the side of the bed? : None Difficulty sitting down on and standing up from a chair with arms (e.g., wheelchair, bedside commode, etc,.)?: None Help needed moving to and from a bed to chair (including a wheelchair)?: A Little Help needed walking in hospital room?: A Little Help needed climbing 3-5 steps with a railing? : A Lot 6 Click Score: 20    End of Session Equipment Utilized During Treatment: Oxygen;Gait belt Activity Tolerance: Patient tolerated treatment well;Patient limited by pain Patient left: in chair;with call bell/phone within reach;with family/visitor present Nurse Communication: Mobility status PT Visit Diagnosis: Unsteadiness on feet (R26.81);Muscle weakness (generalized) (M62.81);Pain Pain - part of body:  (head)     Time: 1583-0940 PT Time Calculation (min) (ACUTE ONLY): 27 min  Charges:  $Gait Training: 8-22 mins $Therapeutic Activity: 8-22 mins                    G Codes:       Wray Kearns, PT, DPT [phone]     Marguarite Arbour A Jovonta Levit 01/15/2017, 10:21 AM

## 2017-01-15 NOTE — Progress Notes (Signed)
Dr. Eliseo Squires aware of patient having frequent loose stool and belly pain. We will continue to monitor and will send a c.diff if able to obtain specimen.

## 2017-01-15 NOTE — Progress Notes (Signed)
Name: Ann [name] MRN: [id number] DOB: [birth year]    ADMISSION DATE:  01/11/2017 CONSULTATION DATE:  01/14/2017  REFERRING MD :  Dr. Eliseo Squires  CHIEF COMPLAINT:  Hypoxia  HISTORY OF PRESENT ILLNESS:   61 year old female with PMH as listed below significant for but not limited to obesity, OSA- non compliant with CPAP, chronic cough, rhinitis, GERD, esophageal strictures with multiple dilations admitted on 7/8 with hypoxia and dyspnea.  Of note, last sleep study noted in 2014 by Dr. Halford Chessman.  Last seen in our office for cough by Dr. Melvyn Novas in 2016.   09/2012.  She reports she doesn't wear her CPAP because it suffocates her.  She complains of ongoing exertional dyspnea (for months), lower leg swelling, daytime sleepiness, and recent increase in headaches.  She sleeps in a chair.  Her daughter says she falls asleep during conversations mid-day.  She reports her husband has been very sick and daughter states she has neglected her health to take care of him.  Her mobility is further limited by chronic knee pain.  She is a never smoker but exposed to second-hand smoke via her husband.    Originally, she presented this admission with progressive three day history of RUQ/ epigastric pain with coffee-ground emesis, dark diarrhea, and headache.  Admits to chills on Saturday and productive greenish cough.  She was found to have room air saturations of 85% on room air.  CTA chest was negative for PE.  Abdominal ultrasound negative for acute process.  She required BiPAP at night.  Echo on 7/9 showed EF 45-50% and therefore cardiology was consulted.  She had a reassuring cardiac CTA with recommendations for weight loss and improved blood pressure control.  She has continued to desaturate at night and after pain medications, requiring CPAP while sleeping and nasal cannula during the day as needed.  ABG shows chronic respiratory acidosis.  PCCM consulted ongoing hypoxia thought to be pulmonary related.      SUBJECTIVE:  No  events overnight, intermittent SOB  VITAL SIGNS: Temp:  [97.5 F (36.4 C)-98.6 F (37 C)] 97.5 F (36.4 C) (07/12 1144) Pulse Rate:  [48-90] 51 (07/12 1144) Resp:  [12-21] 14 (07/12 1144) BP: (95-114)/(51-71) 114/71 (07/12 1144) SpO2:  [93 %-99 %] 93 % (07/12 0713) FiO2 (%):  [50 %] 50 % (07/11 1449) Weight:  [111.5 kg (245 lb 13 oz)] 111.5 kg (245 lb 13 oz) (07/12 0713)  PHYSICAL EXAMINATION: General:  Adult obese female, in bed on BiPAP HEENT: [place]/AT, PERRL, EOM-I and MMM Neuro: Alert and oriented, moving all ext to command CV: RRR, Nl S1/S2, -M/R/G. PULM: Decrease BS at the bases GI: Obese, soft, NT, ND and +BS Extremities: warm/dry, no edema  Skin: no rashes or lesions   Recent Labs Lab 01/13/17 0300 01/14/17 0212 01/15/17 0929  NA 138 137 137  K 3.9 3.9 4.3  CL 98* 102 105  CO2 33* 29 24  BUN 17 24* 17  CREATININE 1.50* 1.43* 1.11*  GLUCOSE 115* 109* 121*    Recent Labs Lab 01/13/17 0300 01/14/17 0212 01/15/17 0929  HGB 12.3 11.6* 12.7  HCT 39.5 38.2 40.8  WBC 4.4 5.2 5.1  PLT 268 268 281   Ct Coronary Morph W/cta Cor W/score W/ca W/cm &/or Wo/cm  Addendum Date: 01/14/2017   ADDENDUM REPORT: 01/14/2017 10:27 CLINICAL DATA:  Chest pain EXAM: Cardiac CTA MEDICATIONS: Sub lingual nitro. 4mg  and lopressor 50 mg TECHNIQUE: The patient was scanned on a Siemens Force 032 slice  scanner. Gantry rotation speed was 250 msecs. Collimation was.6 mm . A 120 kV prospective scan was triggered in the ascending thoracic aorta at 140 HU's . Average HR during the scan was 62 bpm. The 3D data set was interpreted on a dedicated work station using MPR, MIP and VRT modes. A total of 80cc of contrast was used. FINDINGS: Non-cardiac: See separate report from [facility]. No significant findings on limited lung and soft tissue windows. Calcium Score:0 Coronary Arteries: Right dominant with no anomalies LM: Normal LAD:  Normal D1: Normal D2: Normal Circumflex: Normal OM1: Normal  OM2: Normal RCA:  Dominant and normal PDA:  Normal PLA:  Normal IMPRESSION: 1) Normal right dominant coronary arteries 2) Calcium score 0 [place] Electronically Signed   By: Jenkins Rouge M.D.   On: 01/14/2017 10:27   Result Date: 01/14/2017 EXAM: OVER-READ INTERPRETATION  CT CHEST The following report is an over-read performed by radiologist Dr. Rebekah Chesterfield [facility] on 01/14/2017. This over-read does not include interpretation of cardiac or coronary anatomy or pathology. The coronary calcium score/coronary CTA interpretation by the cardiologist is attached. COMPARISON:  Chest CT 01/11/2017. FINDINGS: Within the visualized portions of the thorax there are no suspicious appearing pulmonary nodules or masses, there is no acute consolidative airspace disease, no pleural effusions, no pneumothorax and no lymphadenopathy. Small hiatal hernia. Visualized portions of the upper abdomen are unremarkable. There are no aggressive appearing lytic or blastic lesions noted in the visualized portions of the skeleton. IMPRESSION: 1. Small hiatal hernia. Electronically Signed: By: Vinnie Langton M.D. On: 01/14/2017 09:39   STUDIES:  PFT 09/14/12 >> FEV1 1.88 (78%), FEV1% 78, TLC 3.95 (80%), DLCO 92%, no BD  7/8 CTA chest >> no acute intrathoracic pathology.  Neg for PE 7/9 TTE >> EF 45-50%, mild LVH, diffuse hypokinesis, G1DD, trace MR and TR (PAP not specified) 7/9 [place] abd limited >> cholelithiasis without sonographic evidence of acute cholecystitis; fatty liver 7/11 cardiac CTA >> normal right dominant coronary arteries  ASSESSMENT / PLAN: Acute on chronic hypoxic/hypercapnic respiratory failure - multifactorial  OSA- noncomplaint with CAP Suspected OHS  Tracheobronchomalacia Obesity- BMI 42.5 GERD Chronic cough/ acute bronchitis with productive cough - based on chest CTA, neg for PE but noted for tracheobronchomalacia - cleared by Cards for cardiac origin of SOB ruled out, neg for  CAD via cardiac CT, EF 45-50% Plan: BiPAP while asleep Mandatory at night Arrange for home O2, needs and ambulatory desat study to determine home O2 flow Wean O2 for sat of 88-92% Azithromycin 500mg  now, then 250mg  x 4 days  Pulmonary hygiene with flutter valve  Minimize sedating agents Needs outpatient pulmonary f/u and sleep f/u Weight loss. Continue with home regimen for GERD and rhinitis  Keep dry as able  PCCM will sign off, please call back if needed.  Discussed with PCCM-NP.  Rush Farmer, M.D. [facility] Pulmonary/Critical Care Medicine. Pager: [phone]. After hours pager: [phone].  01/15/2017, 12:01 PM

## 2017-01-15 NOTE — Care Management (Addendum)
CCM NP will order pulm amb test and CM new consult if continuous oxygen is required for discharge  Pt has Sleep study scheduled for 7/19 - center will contact pt prior to date to provide specifics.  Sleep study physician form faxed to center - CM verified with center fax received and accepted.  LOG request form filled out and provided to CSW AD.  CM informed CCM of home BIPAP order requirements from [facility] ( Inspiratory and expiratory pressures that can not be a range).  Once [facility] receives LOG and DME order - [facility] will contact pt for waiver signature and credit card on file (pt is in agreement with these terms).  [facility] contacted [facility] - pt will also have to have valid completed sleep study  CM spoke with [place] requires valid completed sleep study before equipment can be provided.  CM left VM with Clifford at [employer].  CM informed by [employer] that valid completed sleep study is also required for machine delivery.  CM contacted CSW AD and informed of situation - LOG will be issued once Sleep study appt has been made  1011: CM informed by [facility] that pt at the time does not qualify to get BIPAP at discharge - sleep study is greater than 6 months and ABG results do not meet parameters for BIPAP without valid sleep study. CM informed that Pt will be required to pay $250 a month, with a $500-700 initial start up cost with additional cost for any supplies, pt will be required to place credit card of file and have automatic drafts monthly for BIPAP until sleep study is completed with desired parameters for insurance coverage

## 2017-01-15 NOTE — Progress Notes (Signed)
PROGRESS NOTE    Ann [name]  VQQ:[id number] DOB: Jul 26, 1955 DOA: 01/11/2017 PCP: Berkley Harvey, NP   Outpatient Specialists:     Brief Narrative:  Ann [name] is a 61 y.o. female with multiple medical conditions including anxiety, OSA -- not currently being treated, recurrent UTI's, multiple esophageal dilations done By Dr. Olevia Perches, had apparently some adenomas removed, now presented to [facility] with main concern of 3 days duration of progressively worsening RUQ abd pain that is sharp and occasionally radiating to epigastric area.  Patient in the ER was found to be hypoxic and work up found normal coronaries and hypoxia most likely from untreated OSA and obesity hypoventilation.    Assessment & Plan:   Active Problems:   OSA (obstructive sleep apnea)   Severe obesity (BMI >= 40) (HCC)   Hypoxia   Dyspnea   Dyspnea and hypoxia- due to OSA and obesity hypoventilation +/- bronchitis -5L O2 weaned to 2L- meets criteria to go home on O2 during the day and BIPAP at night -patient is caring for dying husband and under significant stress -has had on and of chest pain but cardiac evaluation is unrevealing:  CTA with no CAD -CTA negative for PE and does not comment on fluid/pleural effusions - patient was given lasix at admission but has not had significant diuresis and Cr has increased so doubt CHF and lasix was d/c'd - echo shows EF 45-50 and diffuse hypokinesis-- appreciate cardiology's evaluation- no further work up needed -  CE's negative - TSH normal -patient is a non-smoker but has been exposed to 2nd hand smoke -start on azithromycin per pulm x 5 days  Untreated OSA -has not worn CPAP in months -has been seen by pulm and plan to d/c on BIPAP -being arrange by care management (patient to undergo another sleepy study on Thursday)  Headache -?related to untreated sleep apnea -improved this AM (was able to wear Bipap overnight for the first time)  HTN, essential - continue Norvasc  and Metoprolol - hold losartan and HCTZ for now - BP controlled   RUQ pain -U/S show stones but nothing acute -check HIDA as patient is symptomatic-- if grossly abnormal both cardiology and pulmonology are ok with intervention -consult GS if needed after HIDA  Morbid obesity - Body mass index is 42.51 kg/m -weight loss options discussed  Hx of kidney stones - recent CT abd noted small stone on left side, nonobstructive - also mild inflammatory changes on right ureter likely from passed stone     DVT prophylaxis:  SCD's  Code Status: Full Code   Family Communication: Daughter at bedside  Disposition Plan:  Home in AM if HIDA not grossly abnormal   Consultants:   Cardiology  pulm     Subjective: Still c/o RUQ pain (eating 100% of meals per documentation) +cough  Objective: Vitals:   01/14/17 2328 01/15/17 0310 01/15/17 0713 01/15/17 1144  BP: (!) 96/52 103/62 (!) 95/53 114/71  Pulse: (!) 54 (!) 48 90 (!) 51  Resp: 17 12 17 14   Temp: 98.3 F (36.8 C) 97.9 F (36.6 C) (!) 97.5 F (36.4 C) (!) 97.5 F (36.4 C)  TempSrc: Axillary Axillary Oral Axillary  SpO2: 99% 96% 93%   Weight:   111.5 kg (245 lb 13 oz)   Height:        Intake/Output Summary (Last 24 hours) at 01/15/17 1455 Last data filed at 01/15/17 1333  Gross per 24 hour  Intake  720 ml  Output                0 ml  Net              720 ml   Filed Weights   01/11/17 1059 01/15/17 0713  Weight: 108.9 kg (240 lb) 111.5 kg (245 lb 13 oz)    Examination:  General exam: in bed- wearing BIPAP Respiratory system: no wheezing, diminished at base Cardiovascular system: rrr Gastrointestinal system: +BS, soft, min RUQ tenderness Central nervous system: A+Ox3, NAD. Extremities: moves all 4 ext- tender to palpation in LE Psychiatry: NAD- in good spirits    Data Reviewed: I have personally reviewed following labs and imaging studies  CBC:  Recent Labs Lab 01/11/17 1106  01/12/17 1003 01/13/17 0300 01/14/17 0212 01/15/17 0929  WBC 9.8 4.6 4.4 5.2 5.1  HGB 13.7 13.2 12.3 11.6* 12.7  HCT 42.0 41.5 39.5 38.2 40.8  MCV 98.4 101.7* 103.1* 101.1* 100.2*  PLT 270 257 268 268 277   Basic Metabolic Panel:  Recent Labs Lab 01/11/17 1106 01/12/17 0853 01/13/17 0300 01/14/17 0212 01/15/17 0929  NA 139 138 138 137 137  K 4.0 4.2 3.9 3.9 4.3  CL 107 104 98* 102 105  CO2 24 25 33* 29 24  GLUCOSE 131* 96 115* 109* 121*  BUN 14 16 17  24* 17  CREATININE 1.08* 1.18* 1.50* 1.43* 1.11*  CALCIUM 8.7* 8.7* 8.7* 8.5* 8.6*   GFR: Estimated Creatinine Clearance: 66.3 mL/min (A) (by C-G formula based on SCr of 1.11 mg/dL (H)). Liver Function Tests:  Recent Labs Lab 01/11/17 1106  AST 22  ALT 19  ALKPHOS 74  BILITOT 1.0  PROT 7.0  ALBUMIN 3.4*    Recent Labs Lab 01/11/17 1106  LIPASE 23   No results for input(s): AMMONIA in the last 168 hours. Coagulation Profile: No results for input(s): INR, PROTIME in the last 168 hours. Cardiac Enzymes:  Recent Labs Lab 01/11/17 2116 01/12/17 0248 01/12/17 0853  TROPONINI <0.03 <0.03 <0.03   BNP (last 3 results) No results for input(s): PROBNP in the last 8760 hours. HbA1C: No results for input(s): HGBA1C in the last 72 hours. CBG: No results for input(s): GLUCAP in the last 168 hours. Lipid Profile: No results for input(s): CHOL, HDL, LDLCALC, TRIG, CHOLHDL, LDLDIRECT in the last 72 hours. Thyroid Function Tests: No results for input(s): TSH, T4TOTAL, FREET4, T3FREE, THYROIDAB in the last 72 hours. Anemia Panel: No results for input(s): VITAMINB12, FOLATE, FERRITIN, TIBC, IRON, RETICCTPCT in the last 72 hours. Urine analysis:    Component Value Date/Time   COLORURINE YELLOW 01/11/2017 1559   APPEARANCEUR CLEAR 01/11/2017 1559   LABSPEC 1.025 01/11/2017 1559   PHURINE 6.0 01/11/2017 1559   GLUCOSEU NEGATIVE 01/11/2017 1559   HGBUR NEGATIVE 01/11/2017 1559   BILIRUBINUR NEGATIVE 01/11/2017  1559   KETONESUR NEGATIVE 01/11/2017 1559   PROTEINUR NEGATIVE 01/11/2017 1559   UROBILINOGEN 0.2 02/09/2015 1322   NITRITE NEGATIVE 01/11/2017 1559   LEUKOCYTESUR NEGATIVE 01/11/2017 1559     ) Recent Results (from the past 240 hour(s))  MRSA PCR Screening     Status: None   Collection Time: 01/11/17 10:24 PM  Result Value Ref Range Status   MRSA by PCR NEGATIVE NEGATIVE Final    Comment:        The GeneXpert MRSA Assay (FDA approved for NASAL specimens only), is one component of a comprehensive MRSA colonization surveillance program. It is not intended to diagnose MRSA infection  nor to guide or monitor treatment for MRSA infections.       Anti-infectives    Start     Dose/Rate Route Frequency Ordered Stop   01/15/17 1000  azithromycin (ZITHROMAX) tablet 250 mg     250 mg Oral Daily 01/14/17 1537 01/19/17 0959   01/14/17 1545  azithromycin (ZITHROMAX) tablet 500 mg     500 mg Oral  Once 01/14/17 1537 01/14/17 1745       Radiology Studies: Ct Coronary Morph W/cta Cor W/score W/ca W/cm &/or Wo/cm  Addendum Date: 01/14/2017   ADDENDUM REPORT: 01/14/2017 10:27 CLINICAL DATA:  Chest pain EXAM: Cardiac CTA MEDICATIONS: Sub lingual nitro. 4mg  and lopressor 50 mg TECHNIQUE: The patient was scanned on a [employer] 333 slice scanner. Gantry rotation speed was 250 msecs. Collimation was.6 mm . A 120 kV prospective scan was triggered in the ascending thoracic aorta at 140 HU's . Average HR during the scan was 62 bpm. The 3D data set was interpreted on a dedicated work station using MPR, MIP and VRT modes. A total of 80cc of contrast was used. FINDINGS: Non-cardiac: See separate report from [facility]. No significant findings on limited lung and soft tissue windows. Calcium Score:0 Coronary Arteries: Right dominant with no anomalies LM: Normal LAD:  Normal D1: Normal D2: Normal Circumflex: Normal OM1: Normal OM2: Normal RCA:  Dominant and normal PDA:  Normal PLA:  Normal  IMPRESSION: 1) Normal right dominant coronary arteries 2) Calcium score 0 [place] Electronically Signed   By: Jenkins Rouge M.D.   On: 01/14/2017 10:27   Result Date: 01/14/2017 EXAM: OVER-READ INTERPRETATION  CT CHEST The following report is an over-read performed by radiologist Dr. Rebekah Chesterfield [facility] on 01/14/2017. This over-read does not include interpretation of cardiac or coronary anatomy or pathology. The coronary calcium score/coronary CTA interpretation by the cardiologist is attached. COMPARISON:  Chest CT 01/11/2017. FINDINGS: Within the visualized portions of the thorax there are no suspicious appearing pulmonary nodules or masses, there is no acute consolidative airspace disease, no pleural effusions, no pneumothorax and no lymphadenopathy. Small hiatal hernia. Visualized portions of the upper abdomen are unremarkable. There are no aggressive appearing lytic or blastic lesions noted in the visualized portions of the skeleton. IMPRESSION: 1. Small hiatal hernia. Electronically Signed: By: Vinnie Langton M.D. On: 01/14/2017 09:39        Scheduled Meds: . amLODipine  10 mg Oral Daily  . azithromycin  250 mg Oral Daily  . clonazePAM  0.5 mg Oral QHS  . metoprolol succinate  50 mg Oral Q2200  . multivitamin with minerals  1 tablet Oral Daily  . pantoprazole  40 mg Oral Daily  . polyethylene glycol  17 g Oral BID  . topiramate  50 mg Oral BID   Continuous Infusions:   LOS: 4 days    Time spent: 35 min    [place], DO [facility] Pager [phone]  If 7PM-7AM, please contact night-coverage [email or web address] Password [facility] 01/15/2017, 2:55 PM

## 2017-01-15 NOTE — Progress Notes (Signed)
SATURATION QUALIFICATIONS: (This note is used to comply with regulatory documentation for home oxygen)  Patient Saturations on Room Air at Rest = 92%  Patient Saturations on Room Air while Ambulating = 86%  Patient Saturations on 2 Liters of oxygen while Ambulating = 93%  Please briefly explain why patient needs home oxygen: 

## 2017-01-16 ENCOUNTER — Inpatient Hospital Stay (HOSPITAL_COMMUNITY): Payer: Medicaid Other

## 2017-01-16 DIAGNOSIS — K8020 Calculus of gallbladder without cholecystitis without obstruction: Secondary | ICD-10-CM

## 2017-01-16 LAB — COMPREHENSIVE METABOLIC PANEL
ALBUMIN: 3.3 g/dL — AB (ref 3.5–5.0)
ALT: 26 U/L (ref 14–54)
AST: 24 U/L (ref 15–41)
Alkaline Phosphatase: 67 U/L (ref 38–126)
Anion gap: 7 (ref 5–15)
BILIRUBIN TOTAL: 0.5 mg/dL (ref 0.3–1.2)
BUN: 16 mg/dL (ref 6–20)
CHLORIDE: 106 mmol/L (ref 101–111)
CO2: 26 mmol/L (ref 22–32)
Calcium: 9.1 mg/dL (ref 8.9–10.3)
Creatinine, Ser: 1.12 mg/dL — ABNORMAL HIGH (ref 0.44–1.00)
GFR calc Af Amer: >60 mL/min (ref >60)
GFR calc non Af Amer: 53 mL/min — ABNORMAL LOW (ref >60)
GLUCOSE: 111 mg/dL — AB (ref 65–99)
POTASSIUM: 4.3 mmol/L (ref 3.5–5.1)
Sodium: 139 mmol/L (ref 135–145)
Total Protein: 6.7 g/dL (ref 6.5–8.1)

## 2017-01-16 MED ORDER — OXYCODONE HCL 5 MG PO TABS: 5.0000 mg | ORAL_TABLET | ORAL | 0 refills | Status: DC | PRN

## 2017-01-16 MED ORDER — AZITHROMYCIN 250 MG PO TABS: 250.0000 mg | ORAL_TABLET | Freq: Every day | ORAL | 0 refills | Status: AC

## 2017-01-16 MED ORDER — AZITHROMYCIN 250 MG PO TABS: 250.0000 mg | ORAL_TABLET | Freq: Every day | ORAL | 0 refills | Status: DC

## 2017-01-16 MED ORDER — ONDANSETRON 4 MG PO TBDP: 4.0000 mg | ORAL_TABLET | Freq: Three times a day (TID) | ORAL | 0 refills | Status: DC | PRN

## 2017-01-16 MED ORDER — POLYETHYLENE GLYCOL 3350 17 G PO PACK: 17.0000 g | PACK | Freq: Every day | ORAL | 0 refills | Status: DC | PRN

## 2017-01-16 MED ORDER — BUTALBITAL-APAP-CAFFEINE 50-325-40 MG PO TABS: 1.0000 | ORAL_TABLET | Freq: Four times a day (QID) | ORAL | 0 refills | Status: DC | PRN

## 2017-01-16 MED ORDER — TECHNETIUM TC 99M MEBROFENIN IV KIT
5.0000 | PACK | Freq: Once | INTRAVENOUS | Status: AC | PRN
  Administered 2017-01-16: 5 via INTRAVENOUS

## 2017-01-16 NOTE — Discharge Summary (Signed)
[facility]  Physician Discharge Summary   Patient ID: Ann [name] MRN: [id number] DOB/AGE: [birth year] 59 y.o.  Admit date: 01/11/2017 Discharge date: 01/16/2017  PCP: Berkley Harvey, NP  DISCHARGE DIAGNOSES:  Active Problems:   OSA (obstructive sleep apnea)   Severe obesity (BMI >= 40) (HCC)   Hypoxia   Dyspnea   RECOMMENDATIONS FOR OUTPATIENT FOLLOW UP: 1. General surgery to arrange outpatient appointment to discuss further management of cholelithiasis 2. Will eventually need follow-up with pulmonology 3. Home health arranged. BiPAP has been arranged through home health agency.   DISCHARGE CONDITION: fair  Diet recommendation: Low-fat diet  Filed Weights   01/11/17 1059 01/15/17 0713 01/16/17 0300  Weight: 108.9 kg (240 lb) 111.5 kg (245 lb 13 oz) 115.2 kg (253 lb 15.5 oz)    INITIAL HISTORY: Ann [name] a 61 y.o.femalewith multiple medical conditions including anxiety, OSA -- not currently being treated, recurrent UTI's, multiple esophageal dilations done By Dr. Olevia Perches, had apparently some adenomas removed, presented to [facility] with main concern of 3 days duration of progressively worsening RUQ abd pain that is sharp and occasionally radiating to epigastric area.  Patient in the ER was found to be hypoxic and work up found normal coronaries and hypoxia most likely from untreated OSA and obesity hypoventilation.    Consultations:  Cardiology  Pulmonology  Phone discussion with Dr. Grandville Silos with general surgery  Procedures: Transthoracic echocardiogram Study Conclusions - Left ventricle: The cavity size was normal. Wall thickness was   increased in a pattern of mild LVH. Systolic function was mildly   reduced. The estimated ejection fraction was in the range of 45%   to 50%. Diffuse hypokinesis. Doppler parameters are consistent   with abnormal left ventricular relaxation (grade 1 diastolic   dysfunction). Impressions: - Mild global reduction in LV  systolic function; mild diastolic   dysfunction; trace MR and TR.  HOSPITAL COURSE:   Dyspnea and hypoxia- due to OSA and obesity hypoventilation +/- bronchitis -5L O2 weaned to 2L- meets criteria to go home on O2 during the day and BIPAP at night -patient is caring for dying husband and under significant stress -has had on and of chest pain but cardiac evaluation is unrevealing: CTA with no CAD -CTA negative for PE - patient was given lasix at admission but has not had significant diuresis and Cr has increased so doubt CHF and lasix was d/c'd - echo shows EF 45-50 and diffuse hypokinesis-- appreciate cardiology's evaluation- no further work up needed - TSH normal -patient is a non-smoker but has been exposed to 2nd hand smoke -started on azithromycin per pulm x 5 days  Untreated OSA -has not worn CPAP in months -has been seen by pulm and plan to d/c on BIPAP -being arrange by care management (patient to undergo another sleep study on Thursday)  Headache -?related to untreated sleep apnea -improved  HTN, essential - continue Norvasc and Metoprolol - hold losartan and HCTZ for now - BP controlled   RUQ pain -U/S showed gallstones without cholecystitis. Patient continued to have symptoms with some nausea but was able to tolerate her meals. -HIDA was done this morning which showed patent biliary duct, however, ejection fraction was noted to be less than normal. LFTs were checked today and were normal.  These findings were discussed with Dr. Grandville Silos with general surgery. He recommends a low-fat diet and outpatient follow-up. He will have his office call the patient to schedule appointment. Any surgical intervention in this patient will  Ann risk due to her frail pulmonary status. So, we would like for her to be optimized some more before any surgery is planned. No urgent need for surgical intervention at this time.   Morbid obesity - Body mass index is 42.51 kg/m -weight loss  options discussed  Hx of kidney stones - recent CT abd noted small stone on left side, nonobstructive - also mild inflammatory changes on right ureter likely from passed stone   Patient also mentioned loose stool. This morning she had solid stool. She was constipated a few days ago. Patient was reassured. She does not have any abdominal tenderness apart from the right upper quadrant.  All of patient's and her daughter's questions were answered. They were both reassured. Okay for discharge home today. Case manager assisting with DME's as well as BiPAP.   PERTINENT LABS:  The results of significant diagnostics from this hospitalization (including imaging, microbiology, ancillary and laboratory) are listed below for reference.    Microbiology: Recent Results (from the past 240 hour(s))  MRSA PCR Screening     Status: None   Collection Time: 01/11/17 10:24 PM  Result Value Ref Range Status   MRSA by PCR NEGATIVE NEGATIVE Final    Comment:        The GeneXpert MRSA Assay (FDA approved for NASAL specimens only), is one component of a comprehensive MRSA colonization surveillance program. It is not intended to diagnose MRSA infection nor to guide or monitor treatment for MRSA infections.      Labs: Basic Metabolic Panel:  Recent Labs Lab 01/12/17 0853 01/13/17 0300 01/14/17 0212 01/15/17 0929 01/16/17 1151  NA 138 138 137 137 139  K 4.2 3.9 3.9 4.3 4.3  CL 104 98* 102 105 106  CO2 25 33* 29 24 26   GLUCOSE 96 115* 109* 121* 111*  BUN 16 17 24* 17 16  CREATININE 1.18* 1.50* 1.43* 1.11* 1.12*  CALCIUM 8.7* 8.7* 8.5* 8.6* 9.1   Liver Function Tests:  Recent Labs Lab 01/11/17 1106 01/16/17 1151  AST 22 24  ALT 19 26  ALKPHOS 74 67  BILITOT 1.0 0.5  PROT 7.0 6.7  ALBUMIN 3.4* 3.3*    Recent Labs Lab 01/11/17 1106  LIPASE 23   CBC:  Recent Labs Lab 01/11/17 1106 01/12/17 1003 01/13/17 0300 01/14/17 0212 01/15/17 0929  WBC 9.8 4.6 4.4 5.2 5.1  HGB  13.7 13.2 12.3 11.6* 12.7  HCT 42.0 41.5 39.5 38.2 40.8  MCV 98.4 101.7* 103.1* 101.1* 100.2*  PLT 270 257 268 268 281   Cardiac Enzymes:  Recent Labs Lab 01/11/17 2116 01/12/17 0248 01/12/17 0853  TROPONINI <0.03 <0.03 <0.03   BNP: BNP (last 3 results)  Recent Labs  01/11/17 1106  BNP 10.4     IMAGING STUDIES Dg Chest 2 View  Result Date: 01/11/2017 CLINICAL DATA:  Stabbing right chest pain for 2 weeks. Chills, cough, nausea and vomiting. EXAM: CHEST  2 VIEW COMPARISON:  08/12/2016 FINDINGS: Mild enlargement of the cardiopericardial silhouette without edema. Thoracic spondylosis. No pleural effusion. IMPRESSION: 1. Mild enlargement of the cardiopericardial silhouette, without edema. 2. Thoracic spondylosis. Electronically Signed   By: Van Clines M.D.   On: 01/11/2017 15:41   Ct Angio Chest Pe W Or Wo Contrast  Result Date: 01/11/2017 CLINICAL DATA:  61 year old female with dyspnea and hypoxia. EXAM: CT ANGIOGRAPHY CHEST WITH CONTRAST TECHNIQUE: Multidetector CT imaging of the chest was performed using the standard protocol during bolus administration of intravenous contrast. Multiplanar CT image reconstructions  and MIPs were obtained to evaluate the vascular anatomy. CONTRAST:  100 cc Isovue 370 COMPARISON:  Chest radiograph dated 01/11/2017 FINDINGS: Cardiovascular: Borderline cardiac size. No pericardial effusion. The thoracic aorta is unremarkable. There is no CT evidence of pulmonary embolism. Mediastinum/Nodes: No hilar or mediastinal adenopathy. Small amount of fluid within the esophagus likely related to gastroesophageal reflux. Lungs/Pleura: The lungs are clear. There is no pleural effusion or pneumothorax. The central airways are patent. Upper Abdomen: The visualized upper abdomen is unremarkable. Musculoskeletal: Mild degenerative changes of the spine. No acute osseous pathology. Review of the MIP images confirms the above findings. IMPRESSION: No acute intrathoracic  pathology. No CT evidence of pulmonary embolism. Electronically Signed   By: Anner Crete M.D.   On: 01/11/2017 23:34   Nm Hepato W/eject Fract  Result Date: 01/16/2017 CLINICAL DATA:  Right upper quadrant pain with nausea vomiting. EXAM: NUCLEAR MEDICINE HEPATOBILIARY IMAGING WITH GALLBLADDER EF TECHNIQUE: Sequential images of the abdomen were obtained out to 60 minutes following intravenous administration of radiopharmaceutical. After oral ingestion of Ensure, gallbladder ejection fraction was determined. At 60 min, normal ejection fraction is greater than 33%. RADIOPHARMACEUTICALS:  5.2 mCi Tc-65m  Choletec IV COMPARISON:  Abdominal ultrasound 01/12/2017 FINDINGS: Prompt uptake and biliary excretion of activity by the liver is seen. Gallbladder activity is visualized, consistent with patency of cystic duct. Biliary activity passes into small bowel, consistent with patent common bile duct. Calculated gallbladder ejection fraction is 21%. (Normal gallbladder ejection fraction with Ensure is greater than 33%.) IMPRESSION: 1. Normal biliary patency study. 2. Decreased gallbladder ejection fraction. Right upper quadrant pain Select proper NMHepatobiliary template. Electronically Signed   By: Misty Stanley M.D.   On: 01/16/2017 10:26   Ct Coronary Morph W/cta Cor W/score W/ca W/cm &/or Wo/cm  Addendum Date: 01/14/2017   ADDENDUM REPORT: 01/14/2017 10:27 CLINICAL DATA:  Chest pain EXAM: Cardiac CTA MEDICATIONS: Sub lingual nitro. 4mg  and lopressor 50 mg TECHNIQUE: The patient was scanned on a Siemens Force 379 slice scanner. Gantry rotation speed was 250 msecs. Collimation was.6 mm . A 120 kV prospective scan was triggered in the ascending thoracic aorta at 140 HU's . Average HR during the scan was 62 bpm. The 3D data set was interpreted on a dedicated work station using MPR, MIP and VRT modes. A total of 80cc of contrast was used. FINDINGS: Non-cardiac: See separate report from [facility]. No  significant findings on limited lung and soft tissue windows. Calcium Score:0 Coronary Arteries: Right dominant with no anomalies LM: Normal LAD:  Normal D1: Normal D2: Normal Circumflex: Normal OM1: Normal OM2: Normal RCA:  Dominant and normal PDA:  Normal PLA:  Normal IMPRESSION: 1) Normal right dominant coronary arteries 2) Calcium score 0 [place] Electronically Signed   By: Jenkins Rouge M.D.   On: 01/14/2017 10:27   Result Date: 01/14/2017 EXAM: OVER-READ INTERPRETATION  CT CHEST The following report is an over-read performed by radiologist Dr. Rebekah Chesterfield [facility] on 01/14/2017. This over-read does not include interpretation of cardiac or coronary anatomy or pathology. The coronary calcium score/coronary CTA interpretation by the cardiologist is attached. COMPARISON:  Chest CT 01/11/2017. FINDINGS: Within the visualized portions of the thorax there are no suspicious appearing pulmonary nodules or masses, there is no acute consolidative airspace disease, no pleural effusions, no pneumothorax and no lymphadenopathy. Small hiatal hernia. Visualized portions of the upper abdomen are unremarkable. There are no aggressive appearing lytic or blastic lesions noted in the visualized portions of the  skeleton. IMPRESSION: 1. Small hiatal hernia. Electronically Signed: By: Vinnie Langton M.D. On: 01/14/2017 09:39   US Abdomen Limited Ruq  Result Date: 01/12/2017 CLINICAL DATA:  61 year old female with right upper quadrant abdominal pain. EXAM: ULTRASOUND ABDOMEN LIMITED RIGHT UPPER QUADRANT COMPARISON:  Abdominal CT dated 12/02/2016 FINDINGS: Gallbladder: There multiple stones within the gallbladder. There is no gallbladder wall thickening or pericholecystic fluid. Negative sonographic Murphy's sign. Common bile duct: Diameter: 4 mm Liver: There is diffuse increased liver echogenicity most consistent with fatty infiltration. Superimposed inflammation or fibrosis is not excluded. The main  portal vein is patent with hepatopetal direction. IMPRESSION: 1. Cholelithiasis without sonographic evidence acute cholecystitis. 2. Fatty liver. Electronically Signed   By: Anner Crete M.D.   On: 01/12/2017 21:28    DISCHARGE EXAMINATION: Vitals:   01/15/17 2345 01/15/17 2353 01/16/17 0300 01/16/17 1202  BP: 99/64  119/84 126/81  Pulse: (!) 59 65 73   Resp: 18 20 18    Temp: 98.3 F (36.8 C)  98.2 F (36.8 C) 98 F (36.7 C)  TempSrc: Oral  Oral Oral  SpO2: 95% 96% 94%   Weight:   115.2 kg (253 lb 15.5 oz)   Height:       General appearance: alert, cooperative, appears stated age, no distress and morbidly obese Resp: clear to auscultation bilaterally Cardio: regular rate and rhythm, S1, S2 normal, no murmur, click, rub or gallop GI: Abdomen is obese. Soft. Mildly tender in the right upper quadrant without Murphy's sign. Nontender elsewhere. No rebound, rigidity or guarding. No masses or organomegaly. Bowel sounds present and normal. Extremities: extremities normal, atraumatic, no cyanosis or edema  DISPOSITION: Home with daughter  Discharge Instructions    Call MD for:  difficulty breathing, headache or visual disturbances    Complete by:  As directed    Call MD for:  extreme fatigue    Complete by:  As directed    Call MD for:  hives    Complete by:  As directed    Call MD for:  persistant dizziness or light-headedness    Complete by:  As directed    Call MD for:  persistant nausea and vomiting    Complete by:  As directed    Call MD for:  severe uncontrolled pain    Complete by:  As directed    Call MD for:  temperature >100.4    Complete by:  As directed    Discharge instructions    Complete by:  As directed    Gen. surgery office will call to schedule an appointment to discuss your gallbladder issues. Take all of medications as prescribed. Please be compliant with BiPAP as instructed by the lung doctors. Use it every night and also during the daytime while taking  naps. Seek attention immediately if abdominal symptoms were to get worse and if you develop persistent nausea and vomiting not relieved with medications.  You were cared for by a hospitalist during your hospital stay. If you have any questions about your discharge medications or the care you received while you were in the hospital after you are discharged, you can call the unit and asked to speak with the hospitalist on call if the hospitalist that took care of you is not available. Once you are discharged, your primary care physician will handle any further medical issues. Please note that NO REFILLS for any discharge medications will be authorized once you are discharged, as it is imperative that you return to your primary  care physician (or establish a relationship with a primary care physician if you do not have one) for your aftercare needs so that they can reassess your need for medications and monitor your lab values. If you do not have a primary care physician, you can call [phone] for a physician referral.   Increase activity slowly    Complete by:  As directed       ALLERGIES:  Allergies  Allergen Reactions  . Doxycycline Diarrhea  . Metronidazole Other (See Comments)    Severe headaches     Discharge Medication List as of 01/16/2017  2:36 PM    START taking these medications   Details  ondansetron (ZOFRAN-ODT) 4 MG disintegrating tablet Take 1 tablet (4 mg total) by mouth every 8 (eight) hours as needed for nausea or vomiting., Starting Fri 01/16/2017, Normal    polyethylene glycol (MIRALAX / GLYCOLAX) packet Take 17 g by mouth daily as needed for moderate constipation., Starting Fri 01/16/2017, Normal      CONTINUE these medications which have CHANGED   Details  azithromycin (ZITHROMAX) 250 MG tablet Take 1 tablet (250 mg total) by mouth daily., Starting Fri 01/16/2017, Until Tue 01/20/2017, Normal    butalbital-acetaminophen-caffeine (FIORICET, ESGIC) 50-325-40 MG tablet Take 1  tablet by mouth every 6 (six) hours as needed for headache., Starting Fri 01/16/2017, Print    oxyCODONE (OXY IR/ROXICODONE) 5 MG immediate release tablet Take 1 tablet (5 mg total) by mouth every 4 (four) hours as needed for moderate pain or breakthrough pain., Starting Fri 01/16/2017, Print      CONTINUE these medications which have NOT CHANGED   Details  albuterol (PROAIR HFA) 108 (90 Base) MCG/ACT inhaler Inhale 2 puffs into the lungs every 4 (four) hours as needed. For cough wheezing., Starting 08/07/2015, Until Discontinued, Historical Med    albuterol (PROVENTIL) (2.5 MG/3ML) 0.083% nebulizer solution Inhale 3 mLs into the lungs every 6 (six) hours as needed. Wheezing/shortness of breath, Starting 08/07/2015, Until Discontinued, Historical Med    amLODipine (NORVASC) 10 MG tablet Take 10 mg by mouth daily. , Until Discontinued, Historical Med    chlorpheniramine (CHLOR-TRIMETON) 4 MG tablet Take 1 tablet (4 mg total) by mouth at bedtime., Starting 03/08/2015, Until Discontinued, OTC    clonazePAM (KLONOPIN) 0.5 MG tablet Take 0.5 mg by mouth at bedtime. , Historical Med    fluticasone (FLONASE) 50 MCG/ACT nasal spray Place 2 sprays into both nostrils daily., Starting 03/08/2015, Until Discontinued, Normal    furosemide (LASIX) 40 MG tablet Take 40 mg by mouth daily as needed for fluid. , Starting 03/06/2015, Until Discontinued, Historical Med    metoprolol succinate (TOPROL-XL) 50 MG 24 hr tablet Take 50 mg by mouth daily., Starting 08/08/2015, Until Discontinued, Historical Med    Multiple Vitamin (MULTIVITAMIN WITH MINERALS) TABS tablet Take 1 tablet by mouth daily., Until Discontinued, Historical Med    omeprazole (PRILOSEC) 40 MG capsule TAKE 1 CAPSULE(40 MG) BY MOUTH TWICE DAILY, Normal    topiramate (TOPAMAX) 50 MG tablet Take 50 mg by mouth 2 (two) times daily., Until Discontinued, Historical Med      STOP taking these medications     losartan-hydrochlorothiazide (HYZAAR) 50-12.5  MG tablet      polyethylene glycol powder (GLYCOLAX/MIRALAX) powder      ranitidine (ZANTAC) 150 MG tablet          Follow-up Information    [facility] up.   Why:  BIPAP and rolling walker Contact information:  [place] [address] [phone]        Georganna Skeans, MD Follow up.   Specialty:  General Surgery Why:  His office will call to schedule appointment to discuss gall bladder/gallstones Contact information: [place] [address] [phone]        Berkley Harvey, NP. Schedule an appointment as soon as possible for a visit in 1 week(s).   Specialty:  Nurse Practitioner Contact information: [place] [address] [phone]        Juanito Doom, MD. Schedule an appointment as soon as possible for a visit in 3 week(s).   Specialty:  Pulmonary Disease Contact information: [place] [address] [phone]           TOTAL DISCHARGE TIME: 35 mins  [facility] Pager [phone]  01/16/2017, 3:38 PM

## 2017-01-16 NOTE — Progress Notes (Signed)
Pt d/c home per MD order, prescriptions and D/C instructions given per MD order, all questions answered

## 2017-01-16 NOTE — Care Management Note (Signed)
Case Management Note  Patient Details  Name: Ann [name] MRN: [id number] Date of Birth: Nov 11, 1955  Subjective/Objective:            Spoke w patient and daughter at bedside. RW and transport O2 to be delivered to room prior to DC. Bipap and home O2 will be set up at home later today. HH through [facility] arranged. Patient clarified husband uses [facility] (not [place]). No other CM needs at this time        Action/Plan:  DC to home w [facility] DME with daughter who will provide transport via private car.  Expected Discharge Date:  01/16/17               Expected Discharge Plan:  [place]  In-House Referral:     Discharge planning Services  CM Consult  Post Acute Care Choice:  Durable Medical Equipment, Home Health Choice offered to:  Patient  DME Arranged:  Bipap, Walker rolling, Oxygen DME Agency:  [place] Arranged:  RN, Respirator Therapy HH Agency:  [place]  Status of Service:  Completed, signed off  If discussed at [place] of Stay Meetings, dates discussed:    Additional Comments:  Carles Collet, RN 01/16/2017, 1:45 PM

## 2017-01-16 NOTE — Progress Notes (Signed)
[facility] has LOG for BiPAP is available at time of DC. RW and home O2 to be delivered to room prior to DC. Will need amb sats done again if DC after 7/14.

## 2017-01-16 NOTE — Progress Notes (Signed)
PT Cancellation Note  Patient Details Name: Ann [name] MRN: [id number] DOB: 1955/07/13   Cancelled Treatment:    Reason Eval/Treat Not Completed: Patient at procedure or test/unavailable Pt off floor at nuclear medicine. Will follow up as time allows.   Marguarite Arbour A Jahkai Yandell 01/16/2017, 9:16 AM Wray Kearns, PT, DPT [phone]

## 2017-01-16 NOTE — Discharge Instructions (Signed)
Cholelithiasis Cholelithiasis is also called "gallstones." It is a kind of gallbladder disease. The gallbladder is an organ that stores a liquid (bile) that helps you digest fat. Gallstones may not cause symptoms (may be silent gallstones) until they cause a blockage, and then they can cause pain (gallbladder attack). Follow these instructions at home:  Take over-the-counter and prescription medicines only as told by your doctor.  Stay at a healthy weight.  Eat healthy foods. This includes: ? Eating fewer fatty foods, like fried foods. ? Eating fewer refined carbs (refined carbohydrates). Refined carbs are breads and grains that are highly processed, like white bread and white rice. Instead, choose whole grains like whole-wheat bread and brown rice. ? Eating more fiber. Almonds, fresh fruit, and beans are healthy sources of fiber.  Keep all follow-up visits as told by your doctor. This is important. Contact a doctor if:  You have sudden pain in the upper right side of your belly (abdomen). Pain might spread to your right shoulder or your chest. This may be a sign of a gallbladder attack.  You feel sick to your stomach (are nauseous).  You throw up (vomit).  You have been diagnosed with gallstones that have no symptoms and you get: ? Belly pain. ? Discomfort, burning, or fullness in the upper part of your belly (indigestion). Get help right away if:  You have sudden pain in the upper right side of your belly, and it lasts for more than 2 hours.  You have belly pain that lasts for more than 5 hours.  You have a fever or chills.  You keep feeling sick to your stomach or you keep throwing up.  Your skin or the whites of your eyes turn yellow (jaundice).  You have dark-colored pee (urine).  You have light-colored poop (stool). Summary  Cholelithiasis is also called "gallstones."  The gallbladder is an organ that stores a liquid (bile) that helps you digest fat.  Silent  gallstones are gallstones that do not cause symptoms.  A gallbladder attack may cause sudden pain in the upper right side of your belly. Pain might spread to your right shoulder or your chest. If this happens, contact your doctor.  If you have sudden pain in the upper right side of your belly that lasts for more than 2 hours, get help right away. This information is not intended to replace advice given to you by your health care provider. Make sure you discuss any questions you have with your health care provider. Document Released: 12/10/2007 Document Revised: 03/09/2016 Document Reviewed: 03/09/2016 Elsevier Interactive Patient Education  2017 [employer].  

## 2017-01-16 NOTE — Progress Notes (Signed)
SATURATION QUALIFICATIONS: (This note is used to comply with regulatory documentation for home oxygen)  Patient Saturations on Room Air at Rest = 89-90%  Patient Saturations on Room Air while Ambulating = 84%  Patient Saturations on 2 Liters of oxygen while Ambulating = 93%  Please briefly explain why patient needs home oxygen: Pt not able to maintain oxygen saturation >88% on RA during ambulation and mobility.   Wray Kearns, [place], DPT [phone]

## 2017-01-16 NOTE — Progress Notes (Signed)
RT came to place patient on BIPAP HS. Patient is currently on BIPAP tolerating well.

## 2017-01-16 NOTE — Progress Notes (Signed)
Physical Therapy Treatment Patient Details Name: Ann [name] MRN: [id number] DOB: January 11, 1956 Today's Date: 01/16/2017    History of Present Illness Ann [name] is a 61 y.o. female with a hx of OSA not wearing CPAP, anxiety, esophageal strictures with multiple dilatations, remote nuc studies neg for ischemia 2007,  HTN,  HLD, and morbid obesity who was seen 01/11/17 for the evaluation of hypoxia and dyspnea, and decreased EF at 45-50% at the request of Dr. Eliseo Squires.  Pt admitted with Rt upper quad pain and nausea and vomiting of coffee-ground emesis.   troponins neg.    PT Comments    Patient progressing slowly towards PT goals. Continues to be limited by pain in head and bil knees with mobility- seems to think it is decreased 02 causing her headache. Able to maintain SP02 >90% on 2L/min 02. Reports having diarrhea all night and not able to eat much. Most likely will need 02 for home. Will follow.   Follow Up Recommendations  No PT follow up;Supervision/Assistance - 24 hour     Equipment Recommendations  Other (comment) (rollator with seat)    Recommendations for Other Services       Precautions / Restrictions Precautions Precautions: Fall Precaution Comments: arthritic knees Restrictions Weight Bearing Restrictions: No    Mobility  Bed Mobility Overal bed mobility: Modified Independent             General bed mobility comments: ABle to get to EOB without assist, increased time.  Transfers Overall transfer level: Needs assistance Equipment used: Rolling walker (2 wheeled) Transfers: Sit to/from Stand Sit to Stand: Supervision         General transfer comment: Supervision for safety; slow to stand due to knee discomfort. Stood from [employer], from toilet x1, from chair x1.  Ambulation/Gait Ambulation/Gait assistance: Min guard Ambulation Distance (Feet): 120 Feet (+ 100') Assistive device: Rolling walker (2 wheeled) Gait Pattern/deviations: Step-to pattern;Step-through  pattern;Decreased stride length;Trunk flexed;Antalgic Gait velocity: decreased Gait velocity interpretation: Below normal speed for age/gender General Gait Details: Slow, unsteady gait with RW for safety. 2/4 DOE inconsistently and worsens when asked about it. 1 seated rest break. Sp02 decreased to 84% on RA, donned 2L/min 02 and SP02 remained >90%.    Stairs            Wheelchair Mobility    Modified Rankin (Stroke Patients Only)       Balance Overall balance assessment: Needs assistance;History of Falls Sitting-balance support: Feet supported;No upper extremity supported Sitting balance-Leahy Scale: Good Sitting balance - Comments: Able to perform pericare without difficulty.   Standing balance support: During functional activity;Bilateral upper extremity supported Standing balance-Leahy Scale: Fair Standing balance comment: Able to ambulate holding onto furniture in room but does better with RW for community ambulation.                            Cognition Arousal/Alertness: Awake/alert Behavior During Therapy: WFL for tasks assessed/performed Overall Cognitive Status: Within Functional Limits for tasks assessed                                 General Comments: Pt reports being worried about her spouse who is dying at home.      Exercises      General Comments General comments (skin integrity, edema, etc.): Daughter present during session.      Pertinent Vitals/Pain Pain Assessment:  Faces Faces Pain Scale: Hurts even more Pain Location: head and knees Pain Descriptors / Indicators: Aching;Sore Pain Intervention(s): Monitored during session;Repositioned;Limited activity within patient's tolerance;Premedicated before session    Home Living                      Prior Function            PT Goals (current goals can now be found in the care plan section) Progress towards PT goals: Progressing toward goals     Frequency    Min 3X/week      PT Plan Current plan remains appropriate    Co-evaluation              AM-PAC PT "6 Clicks" Daily Activity  Outcome Measure  Difficulty turning over in bed (including adjusting bedclothes, sheets and blankets)?: None Difficulty moving from lying on back to sitting on the side of the bed? : None Difficulty sitting down on and standing up from a chair with arms (e.g., wheelchair, bedside commode, etc,.)?: None Help needed moving to and from a bed to chair (including a wheelchair)?: None Help needed walking in hospital room?: A Little Help needed climbing 3-5 steps with a railing? : A Lot 6 Click Score: 21    End of Session Equipment Utilized During Treatment: Oxygen;Gait belt Activity Tolerance: Patient limited by pain Patient left: in bed;with call bell/phone within reach;with family/visitor present Nurse Communication: Mobility status PT Visit Diagnosis: Unsteadiness on feet (R26.81);Muscle weakness (generalized) (M62.81);Pain Pain - Right/Left:  (bil) Pain - part of body: Leg (head)     Time: 1610-9604 PT Time Calculation (min) (ACUTE ONLY): 25 min  Charges:  $Gait Training: 8-22 mins $Therapeutic Exercise: 8-22 mins                    G Codes:       Wray Kearns, PT, DPT [phone]     Marguarite Arbour A Donevin Sainsbury 01/16/2017, 11:49 AM

## 2017-01-22 ENCOUNTER — Ambulatory Visit (HOSPITAL_BASED_OUTPATIENT_CLINIC_OR_DEPARTMENT_OTHER): Payer: Medicaid Other | Attending: Nurse Practitioner | Admitting: Internal Medicine

## 2017-01-22 VITALS — Ht 63.0 in | Wt 236.0 lb

## 2017-01-22 DIAGNOSIS — R5383 Other fatigue: Secondary | ICD-10-CM

## 2017-01-22 DIAGNOSIS — G4730 Sleep apnea, unspecified: Secondary | ICD-10-CM

## 2017-01-22 DIAGNOSIS — G4736 Sleep related hypoventilation in conditions classified elsewhere: Secondary | ICD-10-CM | POA: Diagnosis present

## 2017-01-22 DIAGNOSIS — G4733 Obstructive sleep apnea (adult) (pediatric): Secondary | ICD-10-CM | POA: Insufficient documentation

## 2017-01-22 DIAGNOSIS — R0683 Snoring: Secondary | ICD-10-CM

## 2017-01-23 ENCOUNTER — Other Ambulatory Visit: Payer: Self-pay | Admitting: Surgery

## 2017-01-25 DIAGNOSIS — G4730 Sleep apnea, unspecified: Secondary | ICD-10-CM | POA: Diagnosis not present

## 2017-01-25 DIAGNOSIS — R5383 Other fatigue: Secondary | ICD-10-CM | POA: Diagnosis not present

## 2017-01-25 DIAGNOSIS — R0683 Snoring: Secondary | ICD-10-CM | POA: Diagnosis not present

## 2017-01-25 NOTE — Procedures (Signed)
  Patient Name: Ann [name], Ann [name] Date: 01/22/2017 Gender: Female D.O.B: [birth year] Age (years): 7 Referring Provider: Jennelle Human Height (inches): 63 Interpreting Physician: Baird Lyons MD, ABSM Weight (lbs): 236 RPSGT: Zadie Rhine BMI: 42 MRN: [id number] Neck Size: 15.00 CLINICAL INFORMATION The patient is referred for a CPAP titration to treat sleep apnea. Date of NPSG, Split Night or HST: Diagnostic NPSG 09/18/12  AHI 12.2/ hr, desaturation to 79%, body weight 127 lbs  SLEEP STUDY TECHNIQUE As per the AASM Manual for the Scoring of Sleep and Associated Events v2.3 (April 2016) with a hypopnea requiring 4% desaturations.  The channels recorded and monitored were frontal, central and occipital EEG, electrooculogram (EOG), submentalis EMG (chin), nasal and oral airflow, thoracic and abdominal wall motion, anterior tibialis EMG, snore microphone, electrocardiogram, and pulse oximetry. Continuous positive airway pressure (CPAP) was initiated at the beginning of the study and titrated to treat sleep-disordered breathing.  MEDICATIONS Medications self-administered by patient taken the night of the study : none reported  TECHNICIAN COMMENTS Comments added by technician: NO RESTROOM VISTED  Comments added by scorer: N/A  RESPIRATORY PARAMETERS Optimal PAP Pressure (cm): 8 AHI at Optimal Pressure (/hr): 0.0 Overall Minimal O2 (%): 83.00 Supine % at Optimal Pressure (%): 4 Minimal O2 at Optimal Pressure (%): 85.0    SLEEP ARCHITECTURE The study was initiated at 9:08:20 PM and ended at 3:08:41 AM.  Sleep onset time was 10.7 minutes and the sleep efficiency was 91.0%. The total sleep time was 328.0 minutes.  The patient spent 3.05% of the night in stage N1 sleep, 64.18% in stage N2 sleep, 18.90% in stage N3 and 13.87% in REM.Stage REM latency was 107.5 minutes  Wake after sleep onset was 21.6. Alpha intrusion was absent. Supine sleep was 7.60%.  CARDIAC DATA The 2 lead EKG  demonstrated sinus rhythm. The mean heart rate was 68.16 beats per minute. Other EKG findings include: None.  LEG MOVEMENT DATA The total Periodic Limb Movements of Sleep (PLMS) were 61. The PLMS index was 11.16. A PLMS index of <15 is considered normal in adults.  IMPRESSIONS - The optimal PAP pressure was 8 cm of water. - Central sleep apnea was not noted during this titration (CAI = 0.0/h). - Moderate oxygen desaturations were observed during this titration (min O2 = 83.00%). Study was done on home setting of 2 liters/ minute.  - No snoring was audible during this study. - No cardiac abnormalities were observed during this study. - Mild periodic limb movements were observed during this study. Arousals associated with PLMs were rare.  DIAGNOSIS - Obstructive Sleep Apnea (327.23 [G47.33 ICD-10]) - Nocturnal Hypoxemia (327.26 [G47.36 ICD-10])  RECOMMENDATIONS - Trial of CPAP therapy on 8 cm H2O with a Medium size Resmed Full Face Mask AirFit F20 mask and heated humidification. - Supplemental O2 was continued at 2L/M for this study. - Avoid alcohol, sedatives and other CNS depressants that may worsen sleep apnea and disrupt normal sleep architecture. - Sleep hygiene should be reviewed to assess factors that may improve sleep quality. - Weight management and regular exercise should be initiated or continued.  [Electronically signed] 01/25/2017 02:06 PM  Baird Lyons MD, [place], American Board of Sleep Medicine   NPI: [id number]  [place], American Board of Sleep Medicine  ELECTRONICALLY SIGNED ON:  01/25/2017, 2:00 PM [place] PH: [phone]   FX: [phone] [place]

## 2017-01-26 ENCOUNTER — Encounter: Payer: Self-pay | Admitting: Internal Medicine

## 2017-01-26 ENCOUNTER — Ambulatory Visit (INDEPENDENT_AMBULATORY_CARE_PROVIDER_SITE_OTHER): Payer: Medicaid Other | Admitting: Internal Medicine

## 2017-01-26 VITALS — BP 136/92 | HR 70 | Ht 63.0 in | Wt 240.0 lb

## 2017-01-26 DIAGNOSIS — R05 Cough: Secondary | ICD-10-CM | POA: Diagnosis not present

## 2017-01-26 DIAGNOSIS — J9611 Chronic respiratory failure with hypoxia: Secondary | ICD-10-CM | POA: Diagnosis not present

## 2017-01-26 DIAGNOSIS — G4733 Obstructive sleep apnea (adult) (pediatric): Secondary | ICD-10-CM | POA: Diagnosis not present

## 2017-01-26 DIAGNOSIS — R058 Other specified cough: Secondary | ICD-10-CM

## 2017-01-26 NOTE — Patient Instructions (Addendum)
Prilosec 40 mg Take 30- 60 min before your first and last meals of the day     Please see patient coordinator before you leave today  to schedule CPAP 8/ 02 4lpm and repeat ono on 4lpm   Wear 02 w 2lpm walking but turn up if needed to keep over 90% your goal   Follow up with Dr Halford Chessman next available

## 2017-01-26 NOTE — Progress Notes (Signed)
Subjective:     Patient ID: Ann [name], female   DOB: 1956/04/08,    MRN: [id number]     Brief patient profile:  35 yowm never smoker with whooping cough as child age 61 and ever since then once or twice a year severe cough and fine in between episodes but gradually pattern changed to where cough continually with improved some with  treatment but never better since " I just can't tell ya"  referred to pulmonary clinic 03/30/2015 for clearance for surgery cc worse cough x 01/2015 worst ever. Seen By Dr Halford Chessman 03/08/15 for cough rec Try NeilMed sinus rinse daily Flonase two sprays each nostril daily Sip water when you have urge to cough Salt water gargles once or twice per day Avoid excessive talking Chlorpheniramine 4 mg nightly  Studies done to date  TESTS: PFT 10/01/12 >> FEV1 1.88 (78%), FEV1% 78, TLC 3.95 (80%), DLCO 92%, no BD   03/30/2015  extended ov/Jaloni Sorber re: preop clearance / severe cough and severe obesity  Chief Complaint  Patient presents with  . Advice Only    Pt states needing pulmonary clearance for rectocele surgery. She states that her cough is unchanged or may be worse since last visit.   In mid July 2016 she had an episode in which she thinks she aspirated a piece of a pretzel. This caused coughing and gagging. And persisting assoc with dysphagia > GI eval in progress.  No better with cough drops.  Was intermittently on acei > took last dose ? One day prior to OV   rec nexium 30- 60 min before bfast and prilosec 40 mg  Before supper and pepcid 20 mg at bedtime for now  For drainage / throat tickle try take CHLORPHENIRAMINE  4 mg - take  Up to 2  every 4 hours as needed - available over the counter- may cause drowsiness so start with just a bedtime dose or two and see how you tolerate it before trying in daytime   Take delsym two tsp every 12 hours and supplement if needed with  tramadol 50 mg up to 2 every 4 hours .  GERD diet  Use the flutter every single time you  cough Please schedule a follow up office visit in 2 weeks, sooner if needed with all meds in hand > did not bring it  Late add bystolic 10 mg daily x 2 week sample given.   04/05/15 EGD with dilation   04/13/2015  Extended f/u ov/Sue Mcalexander re: severe cough ? All uacs/onset July 2016 p choking on food ? While on ACEi Chief Complaint  Patient presents with  . Follow-up    Pt states her cough has improved some. No new co's today.   not following instructions as documented above.  Cough remains dry and nonproductive  and mostly daytime rec Nexium 30- 60 min before bfast and prilosec 40 mg  Before supper and pepcid 20 mg at bedtime for now  For drainage / throat tickle try take CHLORPHENIRAMINE  4 mg - take  Up to 2  every 4 hours as needed - available over the counter- may cause drowsiness so start with just a bedtime dose or two and see how you tolerate it before trying in daytime   Take delsym two tsp every 12 hours and supplement if needed with  tramadol 50 mg up to 2 every 4 hours    GERD (REFLUX) diet  Use the flutter every single time you cough Please schedule  a follow up office visit in 2 weeks, sooner if needed with all meds in hand    04/27/2015  f/u ov/Kanya Potteiger re: chronic cough/ no flutter on hand / f/u also hbp   Chief Complaint  Patient presents with  . Follow-up    Pt states that her cough has pretty much resolved.     Still cough when using voice a lot, off tramadol x one week / using otc ppi's now before bfast and lunch and pepcid at hs   rec Continue bystolic until you see Eldridge Abrahams and she can take over on your blood pressure management  Don't use tramadol more 5 straight days  Always keep the flutter handy and cough into it to prevent airway trauma     Admit date: 01/11/2017 Discharge date: 01/16/2017  DISCHARGE DIAGNOSES:  Active Problems:   OSA (obstructive sleep apnea)   Severe obesity (BMI >= 40) (HCC)   Hypoxia   Dyspnea   RECOMMENDATIONS FOR OUTPATIENT  FOLLOW UP: 1. General surgery to arrange outpatient appointment to discuss further management of cholelithiasis 2. Will eventually need follow-up with pulmonology 3. Home health arranged. BiPAP has been arranged through home health agency.   DISCHARGE CONDITION: fair  Diet recommendation: Low-fat diet       Filed Weights   01/11/17 1059 01/15/17 0713 01/16/17 0300  Weight: 108.9 kg (240 lb) 111.5 kg (245 lb 13 oz) 115.2 kg (253 lb 15.5 oz)    INITIAL HISTORY: Ann Botsford Woodis a 61 y.o.femalewith multiple medical conditions including anxiety, OSA -- not currently being treated, recurrent UTI's, multiple esophageal dilations done By Dr. Olevia Perches, had apparently some adenomas removed, presented to [facility] with main concern of 3 days duration of progressively worsening RUQ abd pain that is sharp and occasionally radiating to epigastric area. Patient in the ER was found to be hypoxic and work up found normal coronaries and hypoxia most likely from untreated OSA and obesity hypoventilation.   Consultations:  Cardiology  Pulmonology  Phone discussion with Dr. Grandville Silos with general surgery  Procedures: Transthoracic echocardiogram Study Conclusions - Left ventricle: The cavity size was normal. Wall thickness was increased in a pattern of mild LVH. Systolic function was mildly reduced. The estimated ejection fraction was in the range of 45% to 50%. Diffuse hypokinesis. Doppler parameters are consistent with abnormal left ventricular relaxation (grade 1 diastolic dysfunction). Impressions: - Mild global reduction in LV systolic function; mild diastolic dysfunction; trace MR and TR.  HOSPITAL COURSE:   Dyspnea and hypoxia- due to OSA and obesity hypoventilation +/- bronchitis -5L O2 weaned to 2L- meets criteria to go home on O2 during the day and BIPAP at night -patient is caring for dying husband and under significant stress -has had on and of chest pain but  cardiac evaluation is unrevealing: CTA with no CAD -CTA negative for PE - patient was given lasix at admission but has not had significant diuresis and Cr has increased so doubt CHF and lasix was d/c'd - echo shows EF 45-50 and diffuse hypokinesis-- appreciate cardiology'sevaluation- no further work up needed - TSH normal -patient is a never-smoker but has been exposed to 2nd hand smoke -started on azithromycin per pulm x 5 days  Untreated OSA -has not worn CPAP in months -has been seen by pulm and plan to d/c on BIPAP> -being arrange by care management (patient to undergo another sleep study 01/22/17 and read by Dr Annamaria Boots: Trial of CPAP therapy on 8 cm H2O with a Medium size Resmed  Full Face Mask AirFit F20 mask and heated humidification. - Supplemental O2 was continued at 2L/M for this study. - Avoid alcohol, sedatives and other CNS depressants that may worsen sleep apnea and disrupt normal sleep architecture. - Sleep hygiene should be reviewed to assess factors that may improve sleep quality. - Weight management and regular exercise should be initiated or continued.   Headache -?related to untreated sleep apnea -improved  HTN, essential - continue Norvasc and Metoprolol - hold losartan and HCTZ for now - BP controlled  RUQ pain -U/S showed gallstones without cholecystitis. Patient continued to have symptoms with some nausea but was able to tolerate her meals. -HIDA was done this morning which showed patent biliary duct, however, ejection fraction was noted to be less than normal. LFTs were checked today and were normal.  These findings were discussed with Dr. Grandville Silos with general surgery. He recommends a low-fat diet and outpatient follow-up. He will have his office call the patient to schedule appointment. Any surgical intervention in this patient will carry risk due to her frail pulmonary status. So, we would like for her to be optimized some more before any surgery is  planned. No urgent need for surgical intervention at this time.   Morbid obesity - Body mass index is 42.51 kg/m -weight loss options discussed  Hx of kidney stones - recent CT abd noted small stone on left side, nonobstructive - also mild inflammatory changes on right ureter likely from passed stone   Patient also mentioned loose stool. This morning she had solid stool. She was constipated a few days ago. Patient was reassured. She does not have any abdominal tenderness apart from the right upper quadrant.  All of patient's and her daughter's questions were answered. They were both reassured. Okay for discharge home today. Case manager assisting with DME's as well as BiPAP.     01/26/2017  f/u ov/Raesha Coonrod re: post hosp/ transition of care post admit  OHS/osa  Chief Complaint  Patient presents with  . Hospitalization Follow-up    Pt states her breathing has not improved since hospital d/c. She is using o2 2lpm 24/7.   onset of doe 2-3 months room to room not cpap - remotely by Dr Halford Chessman with osa but not seen since 03/08/15 No cpap x 3 years / no 02 either  On 2lpm now sat upper 80's or lowerer 90's on  4lpm walking No better sob with saba / no longer on ppi bid ac and dry cough daytime has recurred Doe - MMRC3 = can't walk 100 yards even at a slow pace at a flat grade s stopping due to sob    No obvious day to day or daytime variability or assoc excess/ purulent sputum or mucus plugs or hemoptysis or cp or chest tightness, subjective wheeze or overt sinus or hb symptoms. No unusual exp hx or h/o childhood pna/ asthma or knowledge of premature birth.  Sleeping ok on bipap now  without nocturnal  or early am exacerbation  of respiratory  c/o's or need for noct saba. Also denies any obvious fluctuation of symptoms with weather or environmental changes or other aggravating or alleviating factors except as outlined above   Current Medications, Allergies, Complete Past Medical History, Past  Surgical History, Family History, and Social History were reviewed in [employer] record.  ROS  The following are not active complaints unless bolded sore throat, dysphagia, dental problems, itching, sneezing,  nasal congestion or excess/ purulent secretions, ear ache,  fever, chills, sweats, unintended wt loss, classically pleuritic or exertional cp,  orthopnea pnd or leg swelling, presyncope, palpitations, abdominal pain, anorexia, nausea, vomiting, diarrhea  or change in bowel or bladder habits, change in stools or urine, dysuria,hematuria,  rash, arthralgias, visual complaints, headache, numbness, weakness or ataxia or problems with walking or coordination,  change in mood/affect or memory.              Objective:   Physical Exam   amb obese wf    04/14/2015        221 > 04/27/2015  224 >  01/26/2017  240    03/30/15 225 lb (102.059 kg)  03/08/15 223 lb 12.8 oz (101.515 kg)  11/21/14 211 lb (95.709 kg)    Vital signs reviewed  - Note on arrival 02 sats  97% on RA     HEENT: nl dentition, turbinates, and orophanx. Nl external ear canals without cough reflex   NECK :  without JVD/Nodes/TM/ nl carotid upstrokes bilaterally   LUNGS: no acc muscle use, clear to A and P bilaterally without cough on insp or exp maneuvers   CV:  RRR  no s3 or murmur or increase in P2, no edema   ABD:  Quite obese soft and nontender with nl excursion in the supine position. No bruits or organomegaly, bowel sounds nl  MS:  warm without deformities, calf tenderness, cyanosis or clubbing  SKIN: warm and dry without lesions    NEURO:  alert, approp, no deficits            Assessment:

## 2017-01-27 ENCOUNTER — Encounter: Payer: Self-pay | Admitting: Internal Medicine

## 2017-01-27 NOTE — Assessment & Plan Note (Signed)
Note last hco3 nl  01/11/17  Even on 2lpm and approp cpap 8 titration 7/198 still desaturating so needs 4lpm and repeat ono on 4lpm cpap and referral back to Dr Halford Chessman for longterm f/u  - see also osa a/p

## 2017-01-27 NOTE — Assessment & Plan Note (Signed)
Sleep study 01/22/17 and read by Dr Annamaria Boots: Trial of CPAP therapy on 8 cm H2O with a Medium size Resmed Full Face Mask AirFit F20 mask and heated humidification. - Supplemental O2 was continued at 2L/M for this study > changed to 4lpm 01/26/2017 and rec repeat ono on cpap 8 and 4lpm      Her last hco3 level was nl so it's not clear whether the reason she decompensated was all based on non-adherence to noct cpap/02 or she really has classic ohs and will need bipap longterm but for now will focus on rx of osa/ noct desaturation and change back to bipap if worsening hypercarbia noted or clinically deteriorating on cpap 8 as per most recent titration recs per Dr Annamaria Boots pending f/u by Dr Halford Chessman   I had an extended discussion with the patient reviewing all relevant studies completed to date and  lasting 25 minutes of a 40  minute post hosp office visit / transition of care and re-establish    re  severe non-specific but potentially very serious refractory respiratory symptoms of uncertain and potentially multiple  etiologies.   Each maintenance medication was reviewed in detail including most importantly the difference between maintenance and prns and under what circumstances the prns are to be triggered using an action plan format that is not reflected in the computer generated alphabetically organized AVS.    Please see AVS for specific instructions unique to this office visit that I personally wrote and verbalized to the the pt in detail and then reviewed with pt  by my nurse highlighting any changes in therapy/plan of care  recommended at today's visit.

## 2017-01-27 NOTE — Assessment & Plan Note (Signed)
Body mass index is 42.51 kg/m.  -  trending up/ reviewed  Lab Results  Component Value Date   TSH 1.995 01/11/2017     Contributing to gerd risk/ OHS/doe /reviewed the need and the process to achieve and maintain neg calorie balance > defer f/u primary care including intermittently monitoring thyroid status

## 2017-01-27 NOTE — Assessment & Plan Note (Signed)
Allergy profile 04/13/2015 >  Eos 0.1  , IgE 93  neg RAST Sinus CT  04/18/2015> Mild chronic appearing sinusitis s a/f levels  - Flutter valve not using 04/27/2015 > encouraged to continue  - rec restart ppi bid ac 01/26/2017 >>>  Upper airway cough syndrome (previously labeled PNDS) , is  so named because it's frequently impossible to sort out how much is  CR/sinusitis with freq throat clearing (which can be related to primary GERD)   vs  causing  secondary (" extra esophageal")  GERD from wide swings in gastric pressure that occur with throat clearing, often  promoting self use of mint and menthol lozenges that reduce the lower esophageal sphincter tone and exacerbate the problem further in a cyclical fashion.   These are the same pts (now being labeled as having "irritable larynx syndrome" by some cough centers) who not infrequently have a history of having failed to tolerate ace inhibitors,  dry powder inhalers or biphosphonates or report having atypical/extraesophageal reflux symptoms that don't respond to standard doses of PPI  and are easily confused as having aecopd or asthma flares by even experienced allergists/ pulmonologists (myself included).   Reviewed need to stay on diet/ lose wt, use ppi bid ac as prev rec

## 2017-01-28 ENCOUNTER — Telehealth: Payer: Self-pay | Admitting: Internal Medicine

## 2017-01-28 NOTE — Telephone Encounter (Signed)
Spoke with Barbaraann Rondo who states pt is originally through [facility] and he states he spoke with the patient and informed her of this but per her request she does not want to be with [facility], she wishes to go to [facility]. So he set pt up as a new pt with them, he did inform the patient she can switch to them but she is not able to obtain a new machine at this time. Per Barbaraann Rondo pt was ok with this. He stated he was just informing us of their conversation and there was nothing that we needed to do further. Nothing further is needed

## 2017-01-28 NOTE — Telephone Encounter (Signed)
Barbaraann Rondo is returning phone call.

## 2017-01-29 ENCOUNTER — Telehealth: Payer: Self-pay | Admitting: Internal Medicine

## 2017-01-29 DIAGNOSIS — G4733 Obstructive sleep apnea (adult) (pediatric): Secondary | ICD-10-CM

## 2017-01-29 NOTE — Telephone Encounter (Signed)
LM for Jason x 1

## 2017-01-29 NOTE — Telephone Encounter (Signed)
Per Corene Cornea, the ONO order needs to state "on CPAP and 4 liters O2" Per last OV with MW -- Instructions  Prilosec 40 mg Take 30- 60 min before your first and last meals of the day     Please see patient coordinator before you leave today  to schedule CPAP 8/ 02 4lpm and repeat ono on 4lpm   Wear 02 w 2lpm walking but turn up if needed to keep over 90% your goal   Follow up with Dr Halford Chessman next available      New order placed to reflect these instructions. Jason aware. Nothing further needed.

## 2017-01-31 ENCOUNTER — Encounter: Payer: Self-pay | Admitting: Internal Medicine

## 2017-02-03 NOTE — Pre-Procedure Instructions (Signed)
Ann [name]  02/03/2017      [facility] [place], [place] [address] [place] [address] Phone: [phone] Fax: [phone]  [place], [place] [address] Phone: [phone] Fax: [phone]    Your procedure is scheduled on August 7  Report to [facility] at 0700 A.M.  Call this number if you have problems the morning of surgery:  [phone]   Remember:  Do not eat food or drink liquids after midnight.    Take these medicines the morning of surgery with A SIP OF WATER albuterol (PROAIR HFA), albuterol (PROVENTIL) (2.5 MG/3ML) if needed, amLODipine (NORVASC), clonazePAM (KLONOPIN), metoprolol succinate (TOPROL-XL), omeprazole (PRILOSEC), topiramate (TOPAMAX)  7 days prior to surgery STOP taking any Aspirin, Aleve, Naproxen, Ibuprofen, Motrin, Advil, Goody's, BC's, all herbal medications, fish oil, and all vitamins    Do not wear jewelry, make-up or nail polish.  Do not wear lotions, powders, or perfumes, or deoderant.  Do not shave 48 hours prior to surgery.    Do not bring valuables to the hospital.  [facility] is not responsible for any belongings or valuables.  Contacts, dentures or bridgework may not be worn into surgery.  Leave your suitcase in the car.  After surgery it may be brought to your room.  For patients admitted to the hospital, discharge time will be determined by your treatment team.  Patients discharged the day of surgery will not be allowed to drive home.    Special instructions:   [facility]- Preparing For Surgery  Before surgery, you can play an important role. Because skin is not sterile, your skin needs to be as free of germs as possible. You can reduce the number of germs on your skin by washing with CHG (chlorahexidine gluconate) Soap before  surgery.  CHG is an antiseptic cleaner which kills germs and bonds with the skin to continue killing germs even after washing.  Please do not use if you have an allergy to CHG or antibacterial soaps. If your skin becomes reddened/irritated stop using the CHG.  Do not shave (including legs and underarms) for at least 48 hours prior to first CHG shower. It is OK to shave your face.  Please follow these instructions carefully.   1. Shower the NIGHT BEFORE SURGERY and the MORNING OF SURGERY with CHG.   2. If you chose to wash your hair, wash your hair first as usual with your normal shampoo.  3. After you shampoo, rinse your hair and body thoroughly to remove the shampoo.  4. Use CHG as you would any other liquid soap. You can apply CHG directly to the skin and wash gently with a scrungie or a clean washcloth.   5. Apply the CHG Soap to your body ONLY FROM THE NECK DOWN.  Do not use on open wounds or open sores. Avoid contact with your eyes, ears, mouth and genitals (private parts). Wash genitals (private parts) with your normal soap.  6. Wash thoroughly, paying special attention to the area where your surgery will be performed.  7. Thoroughly rinse your body with warm water from the neck down.  8. DO NOT shower/wash with your normal soap after using and rinsing off the CHG Soap.  9. Pat yourself dry with a CLEAN TOWEL.   10.  [place]   11. Place CLEAN SHEETS on your bed the night of your first shower and DO NOT SLEEP WITH PETS.    Day of Surgery: Do not apply any deodorants/lotions. Please wear clean clothes to the hospital/surgery center.      Please read over the following fact sheets that you were given.

## 2017-02-04 ENCOUNTER — Other Ambulatory Visit (HOSPITAL_COMMUNITY): Payer: Self-pay | Admitting: *Deleted

## 2017-02-04 ENCOUNTER — Encounter (HOSPITAL_COMMUNITY): Payer: Self-pay

## 2017-02-04 ENCOUNTER — Encounter (HOSPITAL_COMMUNITY)
Admission: RE | Admit: 2017-02-04 | Discharge: 2017-02-04 | Disposition: A | Discharge status: Home / Self Care | Payer: Medicaid Other | Source: Ambulatory Visit | Attending: Orthopaedic Surgery | Admitting: Orthopaedic Surgery

## 2017-02-04 DIAGNOSIS — Z01818 Encounter for other preprocedural examination: Secondary | ICD-10-CM | POA: Insufficient documentation

## 2017-02-04 DIAGNOSIS — K8020 Calculus of gallbladder without cholecystitis without obstruction: Secondary | ICD-10-CM | POA: Diagnosis not present

## 2017-02-04 HISTORY — DX: Personal history of urinary calculi: Z87.442

## 2017-02-04 HISTORY — DX: Benign neoplasm of colon, unspecified: D12.6

## 2017-02-04 HISTORY — DX: Dyspnea, unspecified: R06.00

## 2017-02-04 LAB — CBC
HEMATOCRIT: 41.1 % (ref 36.0–46.0)
HEMOGLOBIN: 13 g/dL (ref 12.0–15.0)
MCH: 31.2 pg (ref 26.0–34.0)
MCHC: 31.6 g/dL (ref 30.0–36.0)
MCV: 98.6 fL (ref 78.0–100.0)
Platelets: 260 10*3/uL (ref 150–400)
RBC: 4.17 MIL/uL (ref 3.87–5.11)
RDW: 14.5 % (ref 11.5–15.5)
WBC: 4.9 10*3/uL (ref 4.0–10.5)

## 2017-02-04 LAB — BASIC METABOLIC PANEL
ANION GAP: 8 (ref 5–15)
BUN: 12 mg/dL (ref 6–20)
CHLORIDE: 107 mmol/L (ref 101–111)
CO2: 25 mmol/L (ref 22–32)
Calcium: 8.9 mg/dL (ref 8.9–10.3)
Creatinine, Ser: 0.9 mg/dL (ref 0.44–1.00)
GFR calc non Af Amer: >60 mL/min (ref >60)
Glucose, Bld: 105 mg/dL — ABNORMAL HIGH (ref 65–99)
POTASSIUM: 4.3 mmol/L (ref 3.5–5.1)
SODIUM: 140 mmol/L (ref 135–145)

## 2017-02-04 NOTE — Pre-Procedure Instructions (Signed)
Ann [name]  02/04/2017    Your procedure is scheduled on Tuesday, February 10, 2017 at 9:00 AM.   Report to [facility] Entrance "A" Admitting Office at 7:00 AM.   Call this number if you have problems the morning of surgery: [phone]              Questions prior to day of surgery, please call [phone] between 8 & 4 PM.    Remember:  Do not eat food or drink liquids after midnight.              Take these medicines the morning of surgery with A SIP OF WATER: Amlodipine (Norvasc), Metoprolol (Toprol XL), Omeprazole (Prilosec), Topiramate (Topamax), Albuterol nebulizer - if needed, ProAir inhaler - if needed (bring inhaler with you day of surgery)  Do not use Aspirin products or NSAIDS (Ibuprofen, Aleve, etc) 5 days prior to surgery.   Do not wear jewelry, make-up or nail polish.  Do not wear lotions, powders, perfumes or deodorant.  Do not shave 48 hours prior to surgery.    Do not bring valuables to the hospital.  [facility] is not responsible for any belongings or valuables.  Contacts, dentures or bridgework may not be worn into surgery.  Leave your suitcase in the car.  After surgery it may be brought to your room.  For patients admitted to the hospital, discharge time will be determined by your treatment team.  Patients discharged the day of surgery will not be allowed to drive home.   [place]- Preparing For Surgery  Before surgery, you can play an important role. Because skin is not sterile, your skin needs to be as free of germs as possible. You can reduce the number of germs on your skin by washing with CHG (chlorahexidine gluconate) Soap before surgery.  CHG is an antiseptic cleaner which kills germs and bonds with the skin to continue killing germs even after washing.  Please do not use if you have an allergy to CHG or antibacterial soaps. If your skin becomes reddened/irritated stop using the CHG.  Do not shave (including legs and underarms) for at  least 48 hours prior to first CHG shower. It is OK to shave your face.  Please follow these instructions carefully.   1. Shower the NIGHT BEFORE SURGERY and the MORNING OF SURGERY with CHG.   2. If you chose to wash your hair, wash your hair first as usual with your normal shampoo.  3. After you shampoo, rinse your hair and body thoroughly to remove the shampoo.  4. Use CHG as you would any other liquid soap. You can apply CHG directly to the skin and wash gently with a scrungie or a clean washcloth.   5. Apply the CHG Soap to your body ONLY FROM THE NECK DOWN.  Do not use on open wounds or open sores. Avoid contact with your eyes, ears, mouth and genitals (private parts). Wash genitals (private parts) with your normal soap.  6. Wash thoroughly, paying special attention to the area where your surgery will be performed.  7. Thoroughly rinse your body with warm water from the neck down.  8. DO NOT shower/wash with your normal soap after using and rinsing off the CHG Soap.  9. Pat yourself dry with a CLEAN TOWEL.   10. Wear CLEAN [place]   11. Place CLEAN SHEETS on your bed the night of your first shower and DO NOT SLEEP WITH PETS.  Day of Surgery: Do not apply any deodorants/lotions. Please wear clean clothes to the hospital.     Please read over the fact sheets that you were given.

## 2017-02-04 NOTE — Progress Notes (Signed)
Pt denies cardiac history or chest pain. Pt is on 4 L of O2 because she desatted in July when she was in the hospital. She states she's not sure why, states she was told her lungs were flat. Pt also states she has a hx of anesthesia problems, she states she has coded in the past because her O2 levels went down. Pt denies being diabetic, states one doctor told her she was borderline and another doctor told her there was no such thing. She states her husband has a CBG meter and she will occasionally check hers and it's usually in the 90's.

## 2017-02-05 NOTE — Progress Notes (Signed)
Anesthesia Chart Review: Patient is a 62 year old female scheduled for laparoscopic cholecystectomy on 02/10/17 by Dr. Coralie Keens. Anesthesia not consulted to see patient while at PAT, but chart forwarded for preoperative anesthesia review.   History includes never smoker, chronic respiratory failure (home O2), HTN, HLD, GERD, hiatal hernia, esophageal dilatation, arthritis, chronic cystitis, recurrent UTI's, axillary abscess (MRSA) '15, CKD stage III, OSA, TIAs ('80, '08, '12), borderline DM, anxiety, dyspnea, migraines, cystoscopy (left ureteral stent 02/01/16; right double J stent 11/21/14, 06/05/14, and 04/21/14), rectocele repair 02/20/14, anterior vaginal vault repair 12/01/4. BMI is consistent with morbid obesity.  - Admit 01/11/17-01/16/17 for worsening RUQ pain with hypoxia in the ED. RUQ pain showed gallstones without cholecystitis. General surgery recommended out-patient follow-up and would need pulmonary status optimized prior to considering surgery. Hypoxia felt related to untreated OSA and obesity hypoventilation syndrome +/- bronochitis. Troponin and CTA chest negative. She was treated with azithromycin and 5L O2 and weaned to 2L/[place] at discharge (to titrate to keep sats > 90%). Recommendation for BiPAP. (Currently on CPAP with 4L/[place].)   ANESTHESIA HISTORY:   02/20/14 (posterior pelvic floor repair): Intra-op began coughing in Trendelenburg position, small amount secretions coming up in LMA. Sats 100%. Patient suctioned, LMA removed and ETT placed. Lungs with wheezing and rhonchi noted, improved after indotracheal albuterol. In PACU difficultly maintaining saturation and placed on BiPAP. RUL airspace opacity on CXR, possible mucous aspiration. PCCM consulted. O2 for sats < 92%. CPAP at HS. Out-patient f/u CXR.    06/06/13 (anterior vaginal vault repair): Post-op respiratory failure, acute on chronic. Hospitalist consulted. Hypoxia-in pt with OSA and obesity hypoventilation s/p surgery with  periop sedatives and pain meds/opiates possibly contributing to resp suppression leading to increased desatting with sleep. CXR showed bronchitic changes s/p Levofloxacin. CTA negative for PE. Noted patient was a mouth breather and used only the [place] and not CPAP overnight.   "some issues if Propofol given to rapidly"  sister died following general anesthesia "due to undiagnosed OSA"  - PCP is listed as Eldridge Abrahams, NP. - Pulmonologist is Dr. Chesley Mires. Last saw Dr. Christinia Gully on 01/29/17. He wrote: "Even on 2lpm and approp cpap 8 titration 7/198 still desaturating so needs 4lpm and repeat ono on 4lpm cpap and referral back to Dr Halford Chessman for longterm f/u." - Cardiologist is Dr. Marlou Porch, new 01/2017. He saw during 01/2017 hospitalization for mildly reduced EF and hypokinesis on echo. Coronary CTA showed no CAD. No further cardiology follow-up recommended. (Previously saw Dr. Jenkins Rouge 07/18/14 for chest pain.)  - GI is Dr. Renelda Loma Armbruster.  Meds include ProAir, albuterol nebulizer, amlodipine, chlorpheniramine, Klonopin, [place], Toprol XL, Prilosec, Topamax.   BP (!) 147/93   Pulse 67   Temp 36.6 C   Resp 18   Ht 5\' 3"  (1.6 m)   Wt 242 lb (109.8 kg)   SpO2 95%   BMI 42.87 kg/m  She is on O2 4L/[place].  EKG 01/11/17: SR, PACs.  CT coronary 01/14/17: IMPRESSION: 1) Normal right dominant coronary arteries 2) Calcium score 0  Echo 01/12/17: Study Conclusions - Left ventricle: The cavity size was normal. Wall thickness was   increased in a pattern of mild LVH. Systolic function was mildly   reduced. The estimated ejection fraction was in the range of 45%   to 50%. Diffuse hypokinesis. Doppler parameters are consistent   with abnormal left ventricular relaxation (grade 1 diastolic   dysfunction). Impressions: - Mild global reduction in LV systolic function; mild diastolic  dysfunction; trace MR and TR.  CXR 01/11/17: IMPRESSION: 1. Mild enlargement of the cardiopericardial silhouette,  without edema. 2. Thoracic spondylosis.  PFT 09/14/12: FEV1 1.88 (78%), FEV1% 78, TLC 3.95 (80%), DLCO 92%, no BD.  Preoperative labs noted. Cr 0.90. Glucose 105. CBC WNL. LFTS WNL 01/16/17.  Discussed above with anesthesiologist Dr. Linna Caprice. Pulmonary and anesthesia history noted. Patient will need GETA for this procedure. Anesthesiologist to evaluate and discuss definitive anesthesia plan with patient on the day of surgery.  George [facility] Short Stay Center/Anesthesiology Phone [phone] 02/05/2017 3:33 PM

## 2017-02-09 ENCOUNTER — Telehealth: Payer: Self-pay | Admitting: Internal Medicine

## 2017-02-09 NOTE — Telephone Encounter (Signed)
Per MW- ONO on 4lpm was normal (done on 01/30/17 by [facility]) Spoke with pt and notified of results per Dr. Melvyn Novas. Pt verbalized understanding and denied any questions.

## 2017-02-09 NOTE — H&P (Signed)
Ann [name] 01/23/2017 11:02 AM Location: [facility] Patient #: [id number] DOB: October 15, 1955 Married / Language: English / Race: White Female   History of Present Illness (Ann [name] A. Ninfa [name] MD; 01/23/2017 11:21 AM) The patient is a 61 year old female who presents for evaluation of gall stones. This patient is referred by Dr. Bonnielee Haff for evaluation of symptomatic cholelithiasis. She has significant pulmonary issues and was recently in the hospital for obstructive sleep apnea and hypoxia. She has been having right upper quadrant abdominal pain with nausea and vomiting for some time. She was found at that time to have an oversewn showing cholelithiasis. Her HIDA scan also showed a decreased gallbladder ejection fraction. At that time she was not found to have cholecystitis so cholecystectomy was held. She is now home on home oxygen and reports she is still having right upper quadrant abdominal pain which she struck the sharp. She has nausea but no vomiting. She reports that her pain is less but is worse with fatty meals. She will be seeing her pulmonologist on Monday. She is even proceed with cholecystectomy.   Past Surgical History Ann [name], [place]; 01/23/2017 11:02 AM) Colon Polyp Removal - Open  Gallbladder Surgery - Open  Oral Surgery   Diagnostic Studies History Ann [name], [place]; 01/23/2017 11:02 AM) Colonoscopy  within last year Mammogram  never Pap Smear  >5 years ago  Allergies Ann [name], RMA; 01/23/2017 11:04 AM) ACE Inhibitors  DOXYCYCLINE  METRONIDAZOLE  Allergies Reconciled   Medication History Ann [name], RMA; 01/23/2017 11:05 AM) Topiramate (50MG Tablet, Oral) Active. Suprep Bowel Prep Kit (17.5-3.13-1.6GM/180ML Solution, Oral) Active. Sulfamethoxazole-Trimethoprim (800-160MG Tablet, Oral) Active. RaNITidine HCl (150MG Tablet, Oral) Active. Ondansetron HCl (4MG Tablet, Oral) Active. Metoprolol Succinate  ER (50MG Tablet ER 24HR, Oral) Active. Losartan Potassium-HCTZ (50-12.5MG Tablet, Oral) Active. Dicyclomine HCl (10MG Capsule, Oral) Active. Clobetasol Propionate (0.05% Ointment, External) Active. Cefuroxime Axetil (500MG Tablet, Oral) Active. Amoxicillin (875MG Tablet, Oral) Active. ClonazePAM (0.5MG Tablet, Oral) Active. Medications Reconciled  Social History Ann [name], [place]; 01/23/2017 11:03 AM) No alcohol use  No caffeine use  No drug use  Tobacco use  Never smoker.  Family History Ann [name], [place]; 01/23/2017 11:02 AM) Arthritis  Daughter, Mother. Colon Cancer  Father. Depression  Father. Heart Disease  Daughter. Hypertension  Mother. Migraine Headache  Brother, Daughter, Father, Mother, Sister.  Pregnancy / Birth History Ann [name], [place]; 01/23/2017 11:03 AM) Age at menarche  5 years. Age of menopause  <47 Gravida  3 Irregular periods  Maternal age  42-25 Para  3  Other Problems Ann [name], [place]; 01/23/2017 11:03 AM) Anxiety Disorder  Arthritis  Bladder Problems  Cerebrovascular Accident  Cholelithiasis  Diverticulosis  Gastroesophageal Reflux Disease  General anesthesia - complications  Hemorrhoids  High blood pressure  Home Oxygen Use  Kidney Stone  Migraine Headache  Sleep Apnea     Review of Systems Ann [name] RMA; 01/23/2017 11:03 AM) General Present- Appetite Loss and Weight Gain. Not Present- Chills, Fatigue, Fever, Night Sweats and Weight Loss. Respiratory Present- Chronic Cough, Difficulty Breathing, Snoring and Wheezing. Not Present- Bloody sputum. Cardiovascular Present- Difficulty Breathing Lying Down, Leg Cramps, Shortness of Breath and Swelling of Extremities. Not Present- Chest Pain, Palpitations and Rapid Heart Rate. Gastrointestinal Present- Abdominal Pain, Gets full quickly at meals, Hemorrhoids, Indigestion and Nausea. Not Present- Bloating, Bloody Stool, Change in Bowel  Habits, Chronic diarrhea, Constipation, Difficulty Swallowing, Excessive gas, Rectal Pain and Vomiting. Female Genitourinary Present- Frequency and Nocturia. Not Present- Painful Urination, Pelvic  Pain and Urgency. Musculoskeletal Present- Back Pain and Joint Pain. Not Present- Joint Stiffness, Muscle Pain, Muscle Weakness and Swelling of Extremities. Neurological Present- Headaches and Trouble walking. Not Present- Decreased Memory, Fainting, Numbness, Seizures, Tingling, Tremor and Weakness.  Vitals Ann [name] RMA; 01/23/2017 11:06 AM) 01/23/2017 11:05 AM Weight: 239.8 lb Height: 63in Body Surface Area: 2.09 m Body Mass Index: 42.48 kg/m  Temp.: 96.42F  Pulse: 81 (Regular)  BP: 140/90 (Sitting, Left Arm, Standard)       Physical Exam (Kimisha Eunice A. Ninfa [name] MD; 01/23/2017 11:21 AM) General Mental Status-Alert. General Appearance-Consistent with stated age. Hydration-Well hydrated. Voice-Normal.  Head and Neck Head-normocephalic, atraumatic with no lesions or palpable masses.  Eye Eyeball - Bilateral-Extraocular movements intact. Sclera/Conjunctiva - Bilateral-No scleral icterus.  Chest and Lung Exam Chest and lung exam reveals -quiet, even and easy respiratory effort with no use of accessory muscles and on auscultation, normal breath sounds, no adventitious sounds and normal vocal resonance. Inspection Chest Wall - Normal. Back - normal.  Cardiovascular Cardiovascular examination reveals -on palpation PMI is normal in location and amplitude, no palpable S3 or S4. Normal cardiac borders., normal heart sounds, regular rate and rhythm with no murmurs, carotid auscultation reveals no bruits and normal pedal pulses bilaterally.  Abdomen Inspection Inspection of the abdomen reveals - No Hernias. Skin - Scar - no surgical scars. Palpation/Percussion Palpation and Percussion of the abdomen reveal - Soft, No Rebound tenderness, No Rigidity  (guarding) and No hepatosplenomegaly. Tenderness - Right Upper Quadrant. Auscultation Auscultation of the abdomen reveals - Bowel sounds normal.  Neurologic - Did not examine.  Musculoskeletal - Did not examine.    Assessment & Plan (Breean Nannini A. Ninfa [name] MD; 01/23/2017 11:22 AM) SYMPTOMATIC CHOLELITHIASIS (K80.20) Impression: She definitely has symptomatic cholelithiasis and I suspect chronic cholecystitis. I believe we need to go ahead and proceed with cholecystectomy based on her physical examination. If she is deemed not to be a candidate for general anesthesia by her pulmonologist on Monday, we will have to consider a cholecystostomy tube if she showed signs of infection. I believe, however, that she will be able to undergo a laparoscopic cholecystectomy. I discussed gallbladder disease with her in general. I gave her literature regarding surgery. We discussed laparoscopic cholecystectomy in detail. We discussed the risk of surgery which includes but is not limited to bleeding, infection, injury to other structures, the need to convert to an open procedure, cardiopulmonary issues, the need for postoperative ventilation, postoperative recovery, etc. She understands and wishes to proceed with surgery as soon as possible

## 2017-02-10 ENCOUNTER — Observation Stay (HOSPITAL_COMMUNITY)
Admission: RE | Admit: 2017-02-10 | Discharge: 2017-02-11 | Disposition: A | Discharge status: Home / Self Care | Payer: Medicaid Other | Source: Ambulatory Visit | Attending: Surgery | Admitting: Surgery

## 2017-02-10 ENCOUNTER — Encounter (HOSPITAL_COMMUNITY)
Admission: RE | Disposition: A | Discharge status: Home / Self Care | Payer: Self-pay | Source: Ambulatory Visit | Attending: Surgery

## 2017-02-10 ENCOUNTER — Ambulatory Visit (HOSPITAL_COMMUNITY): Payer: Medicaid Other

## 2017-02-10 ENCOUNTER — Encounter (HOSPITAL_COMMUNITY): Payer: Self-pay | Admitting: *Deleted

## 2017-02-10 ENCOUNTER — Ambulatory Visit (HOSPITAL_COMMUNITY): Payer: Medicaid Other | Admitting: Vascular Surgery

## 2017-02-10 DIAGNOSIS — Z79899 Other long term (current) drug therapy: Secondary | ICD-10-CM | POA: Diagnosis not present

## 2017-02-10 DIAGNOSIS — K219 Gastro-esophageal reflux disease without esophagitis: Secondary | ICD-10-CM | POA: Insufficient documentation

## 2017-02-10 DIAGNOSIS — Z9981 Dependence on supplemental oxygen: Secondary | ICD-10-CM | POA: Insufficient documentation

## 2017-02-10 DIAGNOSIS — K8020 Calculus of gallbladder without cholecystitis without obstruction: Secondary | ICD-10-CM

## 2017-02-10 DIAGNOSIS — G4733 Obstructive sleep apnea (adult) (pediatric): Secondary | ICD-10-CM | POA: Insufficient documentation

## 2017-02-10 DIAGNOSIS — I10 Essential (primary) hypertension: Secondary | ICD-10-CM | POA: Insufficient documentation

## 2017-02-10 DIAGNOSIS — K8010 Calculus of gallbladder with chronic cholecystitis without obstruction: Secondary | ICD-10-CM | POA: Diagnosis not present

## 2017-02-10 DIAGNOSIS — K8080 Other cholelithiasis without obstruction: Secondary | ICD-10-CM | POA: Diagnosis present

## 2017-02-10 DIAGNOSIS — Z8673 Personal history of transient ischemic attack (TIA), and cerebral infarction without residual deficits: Secondary | ICD-10-CM | POA: Insufficient documentation

## 2017-02-10 DIAGNOSIS — G43909 Migraine, unspecified, not intractable, without status migrainosus: Secondary | ICD-10-CM | POA: Insufficient documentation

## 2017-02-10 DIAGNOSIS — Z9989 Dependence on other enabling machines and devices: Secondary | ICD-10-CM | POA: Insufficient documentation

## 2017-02-10 HISTORY — PX: LAPAROSCOPIC CHOLECYSTECTOMY: SUR755

## 2017-02-10 HISTORY — PX: CHOLECYSTECTOMY: SHX55

## 2017-02-10 SURGERY — LAPAROSCOPIC CHOLECYSTECTOMY
Anesthesia: General | Site: Abdomen

## 2017-02-10 MED ORDER — ALBUTEROL SULFATE HFA 108 (90 BASE) MCG/ACT IN AERS
INHALATION_SPRAY | RESPIRATORY_TRACT | Status: DC | PRN
  Administered 2017-02-10: 3 via RESPIRATORY_TRACT

## 2017-02-10 MED ORDER — LACTATED RINGERS IV SOLN
Freq: Once | INTRAVENOUS | Status: AC
  Administered 2017-02-10: 08:00:00 via INTRAVENOUS

## 2017-02-10 MED ORDER — FENTANYL CITRATE (PF) 100 MCG/2ML IJ SOLN
INTRAMUSCULAR | Status: AC
Start: 2017-02-10 — End: 2017-02-10
  Administered 2017-02-10: 50 ug
  Filled 2017-02-10: qty 2

## 2017-02-10 MED ORDER — PROMETHAZINE HCL 25 MG/ML IJ SOLN
12.5000 mg | Freq: Four times a day (QID) | INTRAMUSCULAR | Status: DC | PRN
  Administered 2017-02-10 – 2017-02-11 (×3): 12.5 mg via INTRAVENOUS
  Filled 2017-02-10 (×3): qty 1

## 2017-02-10 MED ORDER — MIDAZOLAM HCL 5 MG/5ML IJ SOLN
INTRAMUSCULAR | Status: DC | PRN
  Administered 2017-02-10: 2 mg via INTRAVENOUS

## 2017-02-10 MED ORDER — ONDANSETRON HCL 4 MG/2ML IJ SOLN
INTRAMUSCULAR | Status: AC
Start: 2017-02-10 — End: ?
  Filled 2017-02-10: qty 2

## 2017-02-10 MED ORDER — SUGAMMADEX SODIUM 500 MG/5ML IV SOLN
INTRAVENOUS | Status: AC
  Filled 2017-02-10: qty 5

## 2017-02-10 MED ORDER — METOPROLOL SUCCINATE ER 50 MG PO TB24
50.0000 mg | ORAL_TABLET | Freq: Every day | ORAL | Status: DC
  Administered 2017-02-10 – 2017-02-11 (×2): 50 mg via ORAL
  Filled 2017-02-10 (×2): qty 1

## 2017-02-10 MED ORDER — HYDROMORPHONE HCL 1 MG/ML IJ SOLN
INTRAMUSCULAR | Status: AC
Start: 2017-02-10 — End: 2017-02-10
  Filled 2017-02-10: qty 1

## 2017-02-10 MED ORDER — PROMETHAZINE HCL 25 MG/ML IJ SOLN: 6.2500 mg | INTRAMUSCULAR | Status: DC | PRN

## 2017-02-10 MED ORDER — CHLORHEXIDINE GLUCONATE CLOTH 2 % EX PADS: 6.0000 | MEDICATED_PAD | Freq: Once | CUTANEOUS | Status: DC

## 2017-02-10 MED ORDER — ALBUTEROL SULFATE (2.5 MG/3ML) 0.083% IN NEBU
3.0000 mL | INHALATION_SOLUTION | Freq: Four times a day (QID) | RESPIRATORY_TRACT | Status: DC | PRN
  Administered 2017-02-10: 3 mL via RESPIRATORY_TRACT

## 2017-02-10 MED ORDER — ALBUTEROL SULFATE (2.5 MG/3ML) 0.083% IN NEBU
INHALATION_SOLUTION | RESPIRATORY_TRACT | Status: AC
  Filled 2017-02-10: qty 3

## 2017-02-10 MED ORDER — PROPOFOL 10 MG/ML IV BOLUS
INTRAVENOUS | Status: AC
  Filled 2017-02-10: qty 20

## 2017-02-10 MED ORDER — PANTOPRAZOLE SODIUM 40 MG PO TBEC
40.0000 mg | DELAYED_RELEASE_TABLET | Freq: Every day | ORAL | Status: DC
  Administered 2017-02-10 – 2017-02-11 (×2): 40 mg via ORAL
  Filled 2017-02-10 (×2): qty 1

## 2017-02-10 MED ORDER — MORPHINE SULFATE (PF) 4 MG/ML IV SOLN
1.0000 mg | INTRAVENOUS | Status: DC | PRN
  Administered 2017-02-10 – 2017-02-11 (×3): 4 mg via INTRAVENOUS
  Filled 2017-02-10 (×3): qty 1

## 2017-02-10 MED ORDER — DIPHENHYDRAMINE HCL 50 MG/ML IJ SOLN
25.0000 mg | Freq: Four times a day (QID) | INTRAMUSCULAR | Status: DC | PRN
  Administered 2017-02-10: 25 mg via INTRAVENOUS
  Filled 2017-02-10: qty 1

## 2017-02-10 MED ORDER — ROCURONIUM BROMIDE 100 MG/10ML IV SOLN
INTRAVENOUS | Status: DC | PRN
  Administered 2017-02-10: 50 mg via INTRAVENOUS

## 2017-02-10 MED ORDER — HYDROMORPHONE HCL 1 MG/ML IJ SOLN
0.2500 mg | INTRAMUSCULAR | Status: DC | PRN
  Administered 2017-02-10 (×2): 0.5 mg via INTRAVENOUS

## 2017-02-10 MED ORDER — DICYCLOMINE HCL 10 MG PO CAPS: 10.0000 mg | ORAL_CAPSULE | Freq: Three times a day (TID) | ORAL | Status: DC | PRN

## 2017-02-10 MED ORDER — CEFAZOLIN SODIUM-DEXTROSE 2-4 GM/100ML-% IV SOLN
INTRAVENOUS | Status: AC
  Filled 2017-02-10: qty 100

## 2017-02-10 MED ORDER — DIPHENHYDRAMINE HCL 25 MG PO CAPS
25.0000 mg | ORAL_CAPSULE | Freq: Every day | ORAL | Status: DC
  Administered 2017-02-10: 25 mg via ORAL
  Filled 2017-02-10 (×2): qty 1

## 2017-02-10 MED ORDER — OXYCODONE HCL 5 MG PO TABS
ORAL_TABLET | ORAL | Status: AC
  Filled 2017-02-10: qty 2

## 2017-02-10 MED ORDER — LIDOCAINE HCL (CARDIAC) 20 MG/ML IV SOLN
INTRAVENOUS | Status: DC | PRN
  Administered 2017-02-10: 60 mg via INTRAVENOUS

## 2017-02-10 MED ORDER — MIDAZOLAM HCL 2 MG/2ML IJ SOLN
INTRAMUSCULAR | Status: AC
  Filled 2017-02-10: qty 2

## 2017-02-10 MED ORDER — ROCURONIUM BROMIDE 10 MG/ML (PF) SYRINGE
PREFILLED_SYRINGE | INTRAVENOUS | Status: AC
  Filled 2017-02-10: qty 5

## 2017-02-10 MED ORDER — BUPIVACAINE-EPINEPHRINE 0.25% -1:200000 IJ SOLN
INTRAMUSCULAR | Status: DC | PRN
  Administered 2017-02-10: 20 mL

## 2017-02-10 MED ORDER — POTASSIUM CHLORIDE IN NACL 20-0.9 MEQ/L-% IV SOLN
INTRAVENOUS | Status: DC
  Administered 2017-02-10 – 2017-02-11 (×2): via INTRAVENOUS
  Filled 2017-02-10 (×2): qty 1000

## 2017-02-10 MED ORDER — ONDANSETRON HCL 4 MG/2ML IJ SOLN
4.0000 mg | Freq: Four times a day (QID) | INTRAMUSCULAR | Status: DC | PRN
  Administered 2017-02-10 – 2017-02-11 (×3): 4 mg via INTRAVENOUS
  Filled 2017-02-10 (×3): qty 2

## 2017-02-10 MED ORDER — AMLODIPINE BESYLATE 10 MG PO TABS
10.0000 mg | ORAL_TABLET | Freq: Every day | ORAL | Status: DC
  Administered 2017-02-10 – 2017-02-11 (×2): 10 mg via ORAL
  Filled 2017-02-10 (×2): qty 1

## 2017-02-10 MED ORDER — ONDANSETRON 4 MG PO TBDP: 4.0000 mg | ORAL_TABLET | Freq: Four times a day (QID) | ORAL | Status: DC | PRN

## 2017-02-10 MED ORDER — LABETALOL HCL 5 MG/ML IV SOLN
INTRAVENOUS | Status: DC | PRN
  Administered 2017-02-10: 10 mg via INTRAVENOUS

## 2017-02-10 MED ORDER — FENTANYL CITRATE (PF) 250 MCG/5ML IJ SOLN
INTRAMUSCULAR | Status: AC
  Filled 2017-02-10: qty 5

## 2017-02-10 MED ORDER — OXYCODONE HCL 5 MG PO TABS
5.0000 mg | ORAL_TABLET | ORAL | Status: DC | PRN
  Administered 2017-02-10 – 2017-02-11 (×4): 10 mg via ORAL
  Filled 2017-02-10 (×4): qty 2

## 2017-02-10 MED ORDER — ONDANSETRON HCL 4 MG/2ML IJ SOLN
INTRAMUSCULAR | Status: DC | PRN
  Administered 2017-02-10: 4 mg via INTRAVENOUS

## 2017-02-10 MED ORDER — ALBUTEROL SULFATE HFA 108 (90 BASE) MCG/ACT IN AERS: 2.0000 | INHALATION_SPRAY | RESPIRATORY_TRACT | Status: DC | PRN

## 2017-02-10 MED ORDER — LIDOCAINE 2% (20 MG/ML) 5 ML SYRINGE
INTRAMUSCULAR | Status: AC
  Filled 2017-02-10: qty 5

## 2017-02-10 MED ORDER — BUPIVACAINE-EPINEPHRINE (PF) 0.25% -1:200000 IJ SOLN
INTRAMUSCULAR | Status: AC
  Filled 2017-02-10: qty 30

## 2017-02-10 MED ORDER — SUGAMMADEX SODIUM 500 MG/5ML IV SOLN
INTRAVENOUS | Status: DC | PRN
  Administered 2017-02-10: 500 mg via INTRAVENOUS

## 2017-02-10 MED ORDER — FENTANYL CITRATE (PF) 100 MCG/2ML IJ SOLN
INTRAMUSCULAR | Status: DC | PRN
  Administered 2017-02-10: 50 ug via INTRAVENOUS
  Administered 2017-02-10: 100 ug via INTRAVENOUS
  Administered 2017-02-10 (×2): 50 ug via INTRAVENOUS

## 2017-02-10 MED ORDER — TOPIRAMATE 100 MG PO TABS
50.0000 mg | ORAL_TABLET | Freq: Two times a day (BID) | ORAL | Status: DC
  Administered 2017-02-10 – 2017-02-11 (×3): 50 mg via ORAL
  Filled 2017-02-10 (×3): qty 1

## 2017-02-10 MED ORDER — 0.9 % SODIUM CHLORIDE (POUR BTL) OPTIME
TOPICAL | Status: DC | PRN
  Administered 2017-02-10: 1000 mL

## 2017-02-10 MED ORDER — SODIUM CHLORIDE 0.9 % IR SOLN
Status: DC | PRN
  Administered 2017-02-10: 1

## 2017-02-10 MED ORDER — PROPOFOL 10 MG/ML IV BOLUS
INTRAVENOUS | Status: DC | PRN
  Administered 2017-02-10: 160 mg via INTRAVENOUS

## 2017-02-10 MED ORDER — CEFAZOLIN SODIUM-DEXTROSE 2-4 GM/100ML-% IV SOLN
2.0000 g | INTRAVENOUS | Status: AC
  Administered 2017-02-10: 2 g via INTRAVENOUS

## 2017-02-10 MED ORDER — LACTATED RINGERS IV SOLN
INTRAVENOUS | Status: DC | PRN
  Administered 2017-02-10: 08:00:00 via INTRAVENOUS

## 2017-02-10 MED ORDER — ENOXAPARIN SODIUM 40 MG/0.4ML ~~LOC~~ SOLN
40.0000 mg | SUBCUTANEOUS | Status: DC
  Administered 2017-02-11: 40 mg via SUBCUTANEOUS
  Filled 2017-02-10: qty 0.4

## 2017-02-10 SURGICAL SUPPLY — 30 items
APPLIER CLIP 5 13 M/L LIGAMAX5 (MISCELLANEOUS) ×3
CANISTER SUCT 3000ML PPV (MISCELLANEOUS) ×3 IMPLANT
CHLORAPREP W/TINT 26ML (MISCELLANEOUS) ×3 IMPLANT
CLIP APPLIE 5 13 M/L LIGAMAX5 (MISCELLANEOUS) ×1 IMPLANT
COVER SURGICAL LIGHT HANDLE (MISCELLANEOUS) ×3 IMPLANT
DERMABOND ADVANCED (GAUZE/BANDAGES/DRESSINGS) ×2
DERMABOND ADVANCED .7 DNX12 (GAUZE/BANDAGES/DRESSINGS) ×1 IMPLANT
ELECT REM PT RETURN 9FT ADLT (ELECTROSURGICAL) ×3
ELECTRODE REM PT RTRN 9FT ADLT (ELECTROSURGICAL) ×1 IMPLANT
GLOVE SURG SIGNA 7.5 PF LTX (GLOVE) ×3 IMPLANT
GOWN STRL REUS W/ TWL LRG LVL3 (GOWN DISPOSABLE) ×2 IMPLANT
GOWN STRL REUS W/ TWL XL LVL3 (GOWN DISPOSABLE) ×1 IMPLANT
GOWN STRL REUS W/TWL LRG LVL3 (GOWN DISPOSABLE) ×4
GOWN STRL REUS W/TWL XL LVL3 (GOWN DISPOSABLE) ×2
KIT BASIN OR (CUSTOM PROCEDURE TRAY) ×3 IMPLANT
KIT ROOM TURNOVER OR (KITS) ×3 IMPLANT
NS IRRIG 1000ML POUR BTL (IV SOLUTION) ×3 IMPLANT
PAD ARMBOARD 7.5X6 YLW CONV (MISCELLANEOUS) ×3 IMPLANT
POUCH SPECIMEN RETRIEVAL 10MM (ENDOMECHANICALS) ×3 IMPLANT
SCISSORS LAP 5X35 DISP (ENDOMECHANICALS) ×3 IMPLANT
SET IRRIG TUBING LAPAROSCOPIC (IRRIGATION / IRRIGATOR) ×3 IMPLANT
SLEEVE ENDOPATH XCEL 5M (ENDOMECHANICALS) ×6 IMPLANT
SPECIMEN JAR SMALL (MISCELLANEOUS) ×3 IMPLANT
SUT MNCRL AB 4-0 PS2 18 (SUTURE) ×3 IMPLANT
TOWEL OR 17X24 6PK STRL BLUE (TOWEL DISPOSABLE) ×3 IMPLANT
TOWEL OR 17X26 10 PK STRL BLUE (TOWEL DISPOSABLE) ×3 IMPLANT
TRAY LAPAROSCOPIC MC (CUSTOM PROCEDURE TRAY) ×3 IMPLANT
TROCAR XCEL BLUNT TIP 100MML (ENDOMECHANICALS) ×3 IMPLANT
TROCAR XCEL NON-BLD 5MMX100MML (ENDOMECHANICALS) ×3 IMPLANT
TUBING INSUFFLATION (TUBING) ×3 IMPLANT

## 2017-02-10 NOTE — Transfer of Care (Signed)
Immediate Anesthesia Transfer of Care Note  Patient: Ann [name]  Procedure(s) Performed: Procedure(s): LAPAROSCOPIC CHOLECYSTECTOMY (N/A)  Patient Location: PACU  Anesthesia Type:General  Level of Consciousness: awake, alert  and oriented  Airway & Oxygen Therapy: Patient Spontanous Breathing and Patient connected to nasal cannula oxygen  Post-op Assessment: Report given to RN and Post -op Vital signs reviewed and stable  Post vital signs: Reviewed and stable  Last Vitals:  Vitals:   02/10/17 0713  BP: (!) 156/85  Pulse: 70  Resp: 18  Temp: 36.8 C    Last Pain:  Vitals:   02/10/17 0713  TempSrc: Oral         Complications: No apparent anesthesia complications

## 2017-02-10 NOTE — Interval H&P Note (Signed)
History and Physical Interval Note: no change in H and  P  02/10/2017 8:36 AM  Ann [name]  has presented today for surgery, with the diagnosis of symtomatic cholelithiasis  The various methods of treatment have been discussed with the patient and family. After consideration of risks, benefits and other options for treatment, the patient has consented to  Procedure(s): LAPAROSCOPIC CHOLECYSTECTOMY (N/A) as a surgical intervention .  The patient's history has been reviewed, patient examined, no change in status, stable for surgery.  I have reviewed the patient's chart and labs.  Questions were answered to the patient's satisfaction.     Arine Foley A

## 2017-02-10 NOTE — Anesthesia Preprocedure Evaluation (Addendum)
Anesthesia Evaluation  Patient identified by MRN, date of birth, ID band Patient awake    Reviewed: Allergy & Precautions, NPO status , Patient's Chart, lab work & pertinent test results  History of Anesthesia Complications (+) PROLONGED EMERGENCE and history of anesthetic complications  Airway Mallampati: II  TM Distance: <3 FB Neck ROM: Full    Dental  (+) Poor Dentition, Missing   Pulmonary shortness of breath, with exertion and Long-Term Oxygen Therapy, asthma , sleep apnea and Continuous Positive Airway Pressure Ventilation ,    breath sounds clear to auscultation       Cardiovascular hypertension,  Rhythm:Regular Rate:Normal     Neuro/Psych  Headaches, Anxiety TIA   GI/Hepatic hiatal hernia, GERD  ,  Endo/Other    Renal/GU      Musculoskeletal  (+) Arthritis ,   Abdominal   Peds  Hematology   Anesthesia Other Findings   Reproductive/Obstetrics                            Anesthesia Physical Anesthesia Plan  ASA: III  Anesthesia Plan: General   Post-op Pain Management:    Induction: Intravenous  PONV Risk Score and Plan:   Airway Management Planned: Oral ETT  Additional Equipment:   Intra-op Plan:   Post-operative Plan: Extubation in OR and Possible Post-op intubation/ventilation  Informed Consent: I have reviewed the patients History and Physical, chart, labs and discussed the procedure including the risks, benefits and alternatives for the proposed anesthesia with the patient or authorized representative who has indicated his/her understanding and acceptance.   Dental advisory given  Plan Discussed with: Anesthesiologist and Surgeon  Anesthesia Plan Comments:        Anesthesia Quick Evaluation

## 2017-02-10 NOTE — Anesthesia Procedure Notes (Signed)
Procedure Name: Intubation Date/Time: 02/10/2017 8:59 AM Performed by: Manuela Schwartz B Pre-anesthesia Checklist: Patient identified, Emergency Drugs available, Suction available, Patient being monitored and Timeout performed Patient Re-evaluated:Patient Re-evaluated prior to induction Oxygen Delivery Method: Circle system utilized Preoxygenation: Pre-oxygenation with 100% oxygen Induction Type: IV induction Ventilation: Mask ventilation without difficulty Laryngoscope Size: Glidescope and 3 Grade View: Grade I Tube type: Oral Tube size: 7.0 mm Number of attempts: 1 Airway Equipment and Method: Stylet and Video-laryngoscopy Placement Confirmation: ETT inserted through vocal cords under direct vision,  breath sounds checked- equal and bilateral and positive ETCO2 Secured at: 21 cm Tube secured with: Tape Dental Injury: Teeth and Oropharynx as per pre-operative assessment

## 2017-02-10 NOTE — Anesthesia Postprocedure Evaluation (Signed)
Anesthesia Post Note  Patient: Ann [name]  Procedure(s) Performed: Procedure(s) (LRB): LAPAROSCOPIC CHOLECYSTECTOMY (N/A)     Patient location during evaluation: PACU Anesthesia Type: General Level of consciousness: awake, sedated and oriented Pain management: pain level controlled Vital Signs Assessment: post-procedure vital signs reviewed and stable Respiratory status: spontaneous breathing, nonlabored ventilation, respiratory function stable and patient connected to nasal cannula oxygen Cardiovascular status: blood pressure returned to baseline and stable Postop Assessment: no signs of nausea or vomiting Anesthetic complications: no    Last Vitals:  Vitals:   02/10/17 1100 02/10/17 1128  BP:  (!) 153/81  Pulse: 72 71  Resp: 13 13  Temp:  (!) 36.3 C    Last Pain:  Vitals:   02/10/17 1128  TempSrc: Oral                 [name] Todorov,JAMES TERRILL

## 2017-02-10 NOTE — Op Note (Signed)

## 2017-02-11 ENCOUNTER — Encounter (HOSPITAL_COMMUNITY): Payer: Self-pay | Admitting: Surgery

## 2017-02-11 DIAGNOSIS — K8010 Calculus of gallbladder with chronic cholecystitis without obstruction: Secondary | ICD-10-CM | POA: Diagnosis not present

## 2017-02-11 MED ORDER — PROMETHAZINE HCL 12.5 MG PO TABS: 12.5000 mg | ORAL_TABLET | Freq: Four times a day (QID) | ORAL | 0 refills | Status: DC | PRN

## 2017-02-11 MED ORDER — OXYCODONE HCL 5 MG PO TABS: 5.0000 mg | ORAL_TABLET | ORAL | 0 refills | Status: DC | PRN

## 2017-02-11 NOTE — Progress Notes (Signed)
Patient placed on CPAP for the night. RT will continue to monitor.

## 2017-02-11 NOTE — Discharge Summary (Signed)
Physician Discharge Summary  Patient ID: Ann [name] MRN: [id number] DOB/AGE: 11/28/1955 61 y.o.  Admit date: 02/10/2017 Discharge date: 02/11/2017  Admission Diagnoses:  Discharge Diagnoses:  Active Problems:   Symptomatic cholelithiasis sleep apnea  Discharged Condition: good  Hospital Course: uneventful post op recovery.  Discharged home POD#1  Consults: None  Significant Diagnostic Studies:   Treatments: surgery: laparoscopic cholecystectomy  Discharge Exam: Blood pressure 114/67, pulse 77, temperature 98.8 F (37.1 C), temperature source Oral, resp. rate 14, height 5\' 3"  (1.6 m), weight 113.4 kg (250 lb), SpO2 98 %. General appearance: alert, cooperative and no distress Resp: clear to auscultation bilaterally Cardio: regular rate and rhythm, S1, S2 normal, no murmur, click, rub or gallop Incision/Wound:clean, abdomen soft  Disposition: 06-Home-Health Care Svc  Discharge Instructions    Discharge patient    Complete by:  As directed    After lunch today   Discharge disposition:  01-Home or Self Care   Discharge patient date:  02/11/2017     Allergies as of 02/11/2017      Reactions   Doxycycline Diarrhea   Severe headaches   Metronidazole Other (See Comments)   Severe headaches      Medication List    TAKE these medications   albuterol (2.5 MG/3ML) 0.083% nebulizer solution Commonly known as:  PROVENTIL Inhale 3 mLs into the lungs every 6 (six) hours as needed. Wheezing/shortness of breath   PROAIR HFA 108 (90 Base) MCG/ACT inhaler Generic drug:  albuterol Inhale 2 puffs into the lungs every 4 (four) hours as needed. For cough wheezing.   amLODipine 10 MG tablet Commonly known as:  NORVASC Take 10 mg by mouth daily.   chlorpheniramine 4 MG tablet Commonly known as:  CHLOR-TRIMETON Take 1 tablet (4 mg total) by mouth at bedtime.   clonazePAM 0.5 MG tablet Commonly known as:  KLONOPIN Take 0.5 mg by mouth 2 (two) times daily as needed for anxiety.    dicyclomine 10 MG capsule Commonly known as:  BENTYL Take 10 mg by mouth 3 (three) times daily as needed for spasms.   metoprolol succinate 50 MG 24 hr tablet Commonly known as:  TOPROL-XL Take 50 mg by mouth daily.   omeprazole 40 MG capsule Commonly known as:  PRILOSEC TAKE 1 CAPSULE(40 MG) BY MOUTH TWICE DAILY   oxyCODONE 5 MG immediate release tablet Commonly known as:  Oxy IR/ROXICODONE Take 1 tablet (5 mg total) by mouth every 4 (four) hours as needed for moderate pain.   polyethylene glycol packet Commonly known as:  MIRALAX / GLYCOLAX Take 17 g by mouth daily as needed for moderate constipation.   topiramate 50 MG tablet Commonly known as:  TOPAMAX Take 50 mg by mouth 2 (two) times daily.   UNABLE TO FIND Med Name: CPAP      Follow-up Information    Coralie Keens, MD. Schedule an appointment as soon as possible for a visit in 4 week(s).   Specialty:  General Surgery Contact information: [address] [place] [address] [phone]           Signed: Harl Bowie 02/11/2017, 7:16 AM

## 2017-02-11 NOTE — Progress Notes (Signed)
Pt nauseated and has headache 8/10.  Gave morphine 4 mg IV and zofran 4 mg IV, called Dr. Ninfa [name].

## 2017-02-11 NOTE — Progress Notes (Signed)
Pt discharge instructions and prescription given.  Pt verbalized understanding.  Pt left floor via wheelchair on 4 lpm via [place] portable O2 accompanied by staff and family.

## 2017-02-11 NOTE — Progress Notes (Signed)
Patient ID: Ann [name], female   DOB: 07/07/1956, 61 y.o.   MRN: [id number]   Doing well Some soreness but otherwise ok No SOB Lungs clear Abdomen soft, ND, minimally tender  Plan: Discharge home after lunch

## 2017-02-11 NOTE — Care Management Note (Signed)
Case Management Note  Patient Details  Name: WESTON FULCO MRN: [id number] Date of Birth: 1956/05/20  Subjective/Objective:                    Action/Plan:  No discharge needs identified at this time Expected Discharge Date:  02/11/17               Expected Discharge Plan:  Home/Self Care  In-House Referral:     Discharge planning Services     Post Acute Care Choice:    Choice offered to:     DME Arranged:    DME Agency:     HH Arranged:    [place] Agency:     Status of Service:  Completed, signed off  If discussed at [employer] of Stay Meetings, dates discussed:    Additional Comments:  Marilu Favre, RN 02/11/2017, 10:08 AM

## 2017-02-11 NOTE — Progress Notes (Signed)
Dr. Ninfa [name] returned call.  He will call in nausea medication on discharge.  V/O Pt ok to discharge home.

## 2017-02-11 NOTE — Discharge Instructions (Signed)
[facility] ______[facility] °LAPAROSCOPIC SURGERY: POST OP INSTRUCTIONS °Always review your discharge instruction sheet given to you by the facility where your surgery was performed. °IF YOU HAVE DISABILITY OR FAMILY LEAVE FORMS, YOU MUST BRING THEM TO THE OFFICE FOR PROCESSING.   °DO NOT GIVE THEM TO YOUR DOCTOR. ° °1. A prescription for pain medication may be given to you upon discharge.  Take your pain medication as prescribed, if needed.  If narcotic pain medicine is not needed, then you may take acetaminophen (Tylenol) or ibuprofen (Advil) as needed. °2. Take your usually prescribed medications unless otherwise directed. °3. If you need a refill on your pain medication, please contact your pharmacy.  They will contact our office to request authorization. Prescriptions will not be filled after 5pm or on week-ends. °4. You should follow a light diet the first few days after arrival home, such as soup and crackers, etc.  Be sure to include lots of fluids daily. °5. Most patients will experience some swelling and bruising in the area of the incisions.  Ice packs will help.  Swelling and bruising can take several days to resolve.  °6. It is common to experience some constipation if taking pain medication after surgery.  Increasing fluid intake and taking a stool softener (such as Colace) will usually help or prevent this problem from occurring.  A mild laxative (Milk of Magnesia or Miralax) should be taken according to package instructions if there are no bowel movements after 48 hours. °7. Unless discharge instructions indicate otherwise, you may remove your bandages 24-48 hours after surgery, and you may shower at that time.  You may have steri-strips (small skin tapes) in place directly over the incision.  These strips should be left on the skin for 7-10 days.  If your surgeon used skin glue on the incision, you may shower in 24 hours.  The glue will flake off over the next 2-3 weeks.  Any sutures or  staples will be removed at the office during your follow-up visit. °8. ACTIVITIES:  You may resume regular (light) daily activities beginning the next day--such as daily self-care, walking, climbing stairs--gradually increasing activities as tolerated.  You may have sexual intercourse when it is comfortable.  Refrain from any heavy lifting or straining until approved by your doctor. °a. You may drive when you are no longer taking prescription pain medication, you can comfortably wear a seatbelt, and you can safely maneuver your car and apply brakes. °b. RETURN TO WORK:  __________________________________________________________ °9. You should see your doctor in the office for a follow-up appointment approximately 2-3 weeks after your surgery.  Make sure that you call for this appointment within a day or two after you arrive home to insure a convenient appointment time. °10. OTHER INSTRUCTIONS: __________________________________________________________________________________________________________________________ __________________________________________________________________________________________________________________________ °WHEN TO CALL YOUR DOCTOR: °1. Fever over 101.0 °2. Inability to urinate °3. Continued bleeding from incision. °4. Increased pain, redness, or drainage from the incision. °5. Increasing abdominal pain ° °The clinic staff is available to answer your questions during regular business hours.  Please don’t hesitate to call and ask to speak to one of the nurses for clinical concerns.  If you have a medical emergency, go to the nearest emergency room or call 911.  A surgeon from [facility] is always on call at the hospital. °[address], [address], [place], [place]  [address] ? [address] [address], [place], [place]   [address] °[phone] ? [phone] ? FAX [phone] °Web site:   [email or web address] °

## 2017-03-08 ENCOUNTER — Emergency Department (HOSPITAL_COMMUNITY): Payer: Medicaid Other

## 2017-03-08 ENCOUNTER — Emergency Department (HOSPITAL_COMMUNITY)
Admission: EM | Admit: 2017-03-08 | Discharge: 2017-03-08 | Disposition: A | Discharge status: Home / Self Care | Payer: Medicaid Other | Attending: Emergency Medicine | Admitting: Emergency Medicine

## 2017-03-08 ENCOUNTER — Encounter (HOSPITAL_COMMUNITY): Payer: Self-pay | Admitting: *Deleted

## 2017-03-08 DIAGNOSIS — R1032 Left lower quadrant pain: Secondary | ICD-10-CM | POA: Diagnosis present

## 2017-03-08 DIAGNOSIS — R109 Unspecified abdominal pain: Secondary | ICD-10-CM

## 2017-03-08 DIAGNOSIS — I10 Essential (primary) hypertension: Secondary | ICD-10-CM | POA: Diagnosis not present

## 2017-03-08 DIAGNOSIS — Z79899 Other long term (current) drug therapy: Secondary | ICD-10-CM | POA: Diagnosis not present

## 2017-03-08 DIAGNOSIS — N201 Calculus of ureter: Secondary | ICD-10-CM | POA: Diagnosis not present

## 2017-03-08 LAB — URINALYSIS, ROUTINE W REFLEX MICROSCOPIC
BACTERIA UA: NONE SEEN
Bilirubin Urine: NEGATIVE
GLUCOSE, UA: NEGATIVE mg/dL
Ketones, ur: NEGATIVE mg/dL
Nitrite: NEGATIVE
PH: 5 (ref 5.0–8.0)
Protein, ur: NEGATIVE mg/dL
Specific Gravity, Urine: 1.018 (ref 1.005–1.030)

## 2017-03-08 MED ORDER — HYDROMORPHONE HCL 1 MG/ML IJ SOLN
1.0000 mg | Freq: Once | INTRAMUSCULAR | Status: AC
  Administered 2017-03-08: 1 mg via INTRAVENOUS
  Filled 2017-03-08: qty 1

## 2017-03-08 MED ORDER — OXYCODONE-ACETAMINOPHEN 5-325 MG PO TABS: 1.0000 | ORAL_TABLET | Freq: Four times a day (QID) | ORAL | 0 refills | Status: DC | PRN

## 2017-03-08 MED ORDER — ONDANSETRON 8 MG PO TBDP: 8.0000 mg | ORAL_TABLET | Freq: Three times a day (TID) | ORAL | 0 refills | Status: DC | PRN

## 2017-03-08 MED ORDER — ONDANSETRON 4 MG PO TBDP
4.0000 mg | ORAL_TABLET | Freq: Once | ORAL | Status: AC
  Administered 2017-03-08: 4 mg via ORAL
  Filled 2017-03-08: qty 1

## 2017-03-08 MED ORDER — KETOROLAC TROMETHAMINE 30 MG/ML IJ SOLN
30.0000 mg | Freq: Once | INTRAMUSCULAR | Status: AC
  Administered 2017-03-08: 30 mg via INTRAVENOUS
  Filled 2017-03-08: qty 1

## 2017-03-08 MED ORDER — ONDANSETRON HCL 4 MG/2ML IJ SOLN
4.0000 mg | Freq: Once | INTRAMUSCULAR | Status: AC
  Administered 2017-03-08: 4 mg via INTRAVENOUS
  Filled 2017-03-08: qty 2

## 2017-03-08 NOTE — ED Provider Notes (Signed)
[facility] DEPT Provider Note   CSN: [id number] Arrival date & time: 03/08/17  1823     History   Chief Complaint Chief Complaint  Patient presents with  . Flank Pain    HPI JAILEY BOOTON is a 61 y.o. female.  Patient c/o left flank pain for the past 1-2 days. Pain constant, dull, mod-severe, waxes and wanes in intensity. Similar to prior kidney stone pain on other side. Denies dysuria or hematuria. +urgency. Denies fever or chills. Nausea. Denies vomiting or diarrhea. Had normal bm today.    The history is provided by the patient.  Flank Pain  Pertinent negatives include no chest pain, no headaches and no shortness of breath.    Past Medical History:  Diagnosis Date  . Adenoma of colon   . Allergy    seasonal  . Anemia    with past pregnancy -not recent  . Anxiety   . Arthritis   . Borderline diabetes   . Chronic cystitis   . Complication of anesthesia    02-20-2014 ([facility]) INTRA-OP RESPIRATORY FAILURE SECONDARY TO POSSIBLE MUCOUS ASPIRATION/  ALSO 06-06-2013 [facility]) POST-OP DESATURATION  EVEN USING CPAP, States some issues if Propofol given to rapidly.  . Diverticulitis   . Diverticulosis of colon   . Dyspnea   . Family history of adverse reaction to anesthesia    PER PT SISTER DIED AFTER GENERAL ANESTHESIA DUE TO UNDIAGNOSED OSA  . GERD (gastroesophageal reflux disease)   . H/O hiatal hernia   . Headache(784.0)    migraines, decreased since turning 50's  . History of chronic cough    "tight sounding cough"  . History of esophageal dilatation   . History of kidney stones   . History of MRSA infection    3/ 2015  AXILLARY ABSCESS  . History of recurrent UTIs   . History of TIAs NO RESIDUAL   1980;  2005;   2008 PT STATES PER CT  SCARRING RIGHT SIDE OF BRAIN  . Hyperlipidemia   . Hypertension   . Kidney disease    III  . Neuromuscular disorder ([place])    HH  . Neuropathy    pt denies this 07-05-2015  . Nocturia   . OSA on CPAP    PER SLEEP STUDY  09-08-2012  MODERATE OSA. not used in awhile, but thinks needs to start back using due to some weight gain.  . Sleep apnea   . Stroke [facility]) [phone]   TIA'S  . SUI (stress urinary incontinence, female)     Patient Active Problem List   Diagnosis Date Noted  . Symptomatic cholelithiasis 02/10/2017  . Hypoxia 01/11/2017  . Dyspnea 01/11/2017  . Dysphagia 04/04/2015  . History of esophageal stricture 04/04/2015  . Morbid (severe) obesity due to excess calories ([place]) 04/01/2015  . ACE-inhibitor cough 03/12/2015  . Upper airway cough syndrome 03/12/2015  . Laryngopharyngeal reflux (LPR) 03/12/2015  . Elevated lipids 07/18/2014  . Chest pain 07/18/2014  . Internal hemorrhoids 04/25/2014  . Ureteral stone with hydronephrosis 04/22/2014  . Post-op pain 04/21/2014  . Rectocele, grade 3 02/20/2014  . Female rectocele without uterine prolapse 08/04/2013  . Unspecified constipation 07/23/2013  . Chronic respiratory failure with hypoxia ([place]) 06/07/2013  . Cystocele 06/06/2013  . Anosmia 01/25/2013  . Unspecified nutritional deficiency   . Polyneuropathy in other diseases classified elsewhere ([place])   . OSA (obstructive sleep apnea)   . Cough 07/27/2012  . Essential hypertension     Past Surgical History:  Procedure  Laterality Date  . ANTERIOR AND POSTERIOR REPAIR N/A 06/06/2013   Procedure: Anterior vaginal vault repair, Sacrospinous ligament fixation with UPHOLD lite, Kelly plication, Sacrospinous mesh fixation, Solyx transurethral sling;  Surgeon: Ailene Rud, MD;  Location: [facility];  Service: Urology;  Laterality: N/A;  . CHOLECYSTECTOMY N/A 02/10/2017   Procedure: LAPAROSCOPIC CHOLECYSTECTOMY;  Surgeon: Coralie Keens, MD;  Location: [facility];  Service: General;  Laterality: N/A;  . CYSTOSCOPY W/ URETERAL STENT PLACEMENT Right 04/21/2014   Procedure: CYSTOSCOPY WITH RETROGRADE PYELOGRAM/URETERAL STENT PLACEMENT;  Surgeon: Ailene Rud, MD;   Location: [facility];  Service: Urology;  Laterality: Right;  . CYSTOSCOPY W/ URETERAL STENT PLACEMENT Right 11/21/2014   Procedure: CYSTOSCOPY WITH RETROGRADE PYELOGRAM, URETEROSCOPY WITH STONE REMOVAL, URETERAL STENT PLACEMENT;  Surgeon: Carolan Clines, MD;  Location: [facility];  Service: Urology;  Laterality: Right;  . CYSTOSCOPY W/ URETERAL STENT PLACEMENT Left 02/01/2016   Procedure: CYSTO, LEFT URETEROSCOPY, LEFT RETROGRADE AND LEFT URETERAL STENT PLACEMENT;  Surgeon: Carolan Clines, MD;  Location: [facility];  Service: Urology;  Laterality: Left;  . CYSTOSCOPY WITH RETROGRADE PYELOGRAM, URETEROSCOPY AND STENT PLACEMENT Right 06/05/2014   Procedure: CYSTOSCOPY WITH RIGHT RETROGRADE PYELOGRAM, RIGHT URETEROSCOPY, STONE EXTRACTION AND RIGHT DOUBLE J STENT PLACEMENT;  Surgeon: Ailene Rud, MD;  Location: [facility];  Service: Urology;  Laterality: Right;  . ESOPHAGEAL DILATION  X2  LAST ONE  JUNE 2014   has had 9 of these  . ESOPHAGEAL MANOMETRY N/A 09/10/2015   Procedure: ESOPHAGEAL MANOMETRY (EM);  Surgeon: Manus Gunning, MD;  [facility];  Service: Gastroenterology;  Laterality: N/A;  . HOLMIUM LASER APPLICATION Right 26/33/3545   Procedure: HOLMIUM LASER OF STONE ;  Surgeon: Ailene Rud, MD;  Location: [facility];  Service: Urology;  Laterality: Right;  . KNEE ARTHROSCOPY Bilateral 2008  . LAPAROSCOPIC CHOLECYSTECTOMY  02/10/2017  . MULTIPLE TOOTH EXTRACTIONS     past hx  . RECTOCELE REPAIR N/A 02/20/2014   Procedure: POSTERIOR REPAIR (RECTOCELE) WITH VAGINAL VAULT REPAIR WITH ACELL GRAFT PLACEMENT, PERINEOPLASTY, ENTEROCELE REPAIR;  Surgeon: Ailene Rud, MD;  Location[facility];  Service: Urology;  Laterality: N/A;  . TUBAL LIGATION  1987    OB History    No data available       Home Medications    Prior to Admission medications   Medication Sig Start Date End Date Taking? Authorizing Provider  albuterol  (PROAIR HFA) 108 (90 Base) MCG/ACT inhaler Inhale 2 puffs into the lungs every 4 (four) hours as needed. For cough wheezing. 08/07/15   [provider]  albuterol (PROVENTIL) (2.5 MG/3ML) 0.083% nebulizer solution Inhale 3 mLs into the lungs every 6 (six) hours as needed. Wheezing/shortness of breath 08/07/15   [provider]  amLODipine (NORVASC) 10 MG tablet Take 10 mg by mouth daily.     [provider]  chlorpheniramine (CHLOR-TRIMETON) 4 MG tablet Take 1 tablet (4 mg total) by mouth at bedtime. 03/08/15   Chesley Mires, MD  clonazePAM (KLONOPIN) 0.5 MG tablet Take 0.5 mg by mouth 2 (two) times daily as needed for anxiety.     [provider]  dicyclomine (BENTYL) 10 MG capsule Take 10 mg by mouth 3 (three) times daily as needed for spasms.    [provider]  metoprolol succinate (TOPROL-XL) 50 MG 24 hr tablet Take 50 mg by mouth daily. 08/08/15   [provider]  omeprazole (PRILOSEC) 40 MG capsule TAKE 1 CAPSULE(40 MG)  BY MOUTH TWICE DAILY 09/17/16   Armbruster, [name], MD  oxyCODONE (OXY IR/ROXICODONE) 5 MG immediate release tablet Take 1 tablet (5 mg total) by mouth every 4 (four) hours as needed for moderate pain. 02/11/17   Coralie Keens, MD  polyethylene glycol [facility] / Floria Raveling) packet Take 17 g by mouth daily as needed for moderate constipation. 01/16/17   Bonnielee Haff, MD  promethazine (PHENERGAN) 12.5 MG tablet Take 1 tablet (12.5 mg total) by mouth every 6 (six) hours as needed for nausea or vomiting. 02/11/17   Coralie Keens, MD  topiramate (TOPAMAX) 50 MG tablet Take 50 mg by mouth 2 (two) times daily.    [provider]  UNABLE TO FIND Med Name: CPAP    [provider]    Family History Family History  Problem Relation Age of Onset  . Colon cancer Father 24       died at 60  . Heart disease Mother        died at 55  . Heart attack Mother   . Heart attack Sister        died at 1  . Hypertension  Brother   . Heart attack Cousin 60       with death  . Heart attack Cousin 12       died with MI  . Esophageal cancer Neg Hx   . Rectal cancer Neg Hx   . Stomach cancer Neg Hx   . Colon polyps Neg Hx     Social History Social History  Substance Use Topics  . Smoking status: Never Smoker  . Smokeless tobacco: Never Used  . Alcohol use No     Allergies   Doxycycline and Metronidazole   Review of Systems Review of Systems  Constitutional: Negative for fever.  HENT: Negative for sore throat.   Eyes: Negative for redness.  Respiratory: Negative for shortness of breath.   Cardiovascular: Negative for chest pain.  Gastrointestinal: Positive for nausea.  Endocrine: Negative for polyuria.  Genitourinary: Positive for flank pain. Negative for dysuria.  Musculoskeletal: Negative for neck pain.  Skin: Negative for rash.  Neurological: Negative for headaches.  Hematological: Does not bruise/bleed easily.  Psychiatric/Behavioral: Negative for confusion.     Physical Exam Updated Vital Signs BP (!) 184/97 (BP Location: Left Arm)   Pulse 64   Temp 98.5 F (36.9 C) (Oral)   Resp 20   Ht 1.575 m (5\' 2" )   Wt 105.2 kg (232 lb)   SpO2 100%   BMI 42.43 kg/m   Physical Exam  Constitutional: She appears well-developed and well-nourished. No distress.  HENT:  Mouth/Throat: Oropharynx is clear and moist.  Eyes: Conjunctivae are normal. No scleral icterus.  Neck: Neck supple. No tracheal deviation present.  Cardiovascular: Normal rate, regular rhythm, normal heart sounds and intact distal pulses.  Exam reveals no gallop and no friction rub.   No murmur heard. Pulmonary/Chest: Effort normal and breath sounds normal. No respiratory distress.  Abdominal: Soft. Normal appearance and bowel sounds are normal. She exhibits no distension and no mass. There is no tenderness. There is no rebound and no guarding. No hernia.  Genitourinary:  Genitourinary Comments: No cva tenderness    Musculoskeletal: She exhibits no edema.  Neurological: She is alert.  Skin: Skin is warm and dry. No rash noted. She is not diaphoretic.  Psychiatric: She has a normal mood and affect.  Nursing note and vitals reviewed.    ED Treatments / Results  Labs (  all labs ordered are listed, but only abnormal results are displayed) Results for orders placed or performed during the hospital encounter of 56/38/75  Basic metabolic panel  Result Value Ref Range   Sodium 140 135 - 145 mmol/L   Potassium 4.3 3.5 - 5.1 mmol/L   Chloride 107 101 - 111 mmol/L   CO2 25 22 - 32 mmol/L   Glucose, Bld 105 (H) 65 - 99 mg/dL   BUN 12 6 - 20 mg/dL   Creatinine, Ser 0.90 0.44 - 1.00 mg/dL   Calcium 8.9 8.9 - 10.3 mg/dL   GFR calc non Af Amer >60 >60 mL/min   GFR calc Af Amer >60 >60 mL/min   Anion gap 8 5 - 15  CBC  Result Value Ref Range   WBC 4.9 4.0 - 10.5 K/uL   RBC 4.17 3.87 - 5.11 MIL/uL   Hemoglobin 13.0 12.0 - 15.0 g/dL   HCT 41.1 36.0 - 46.0 %   MCV 98.6 78.0 - 100.0 fL   MCH 31.2 26.0 - 34.0 pg   MCHC 31.6 30.0 - 36.0 g/dL   RDW 14.5 11.5 - 15.5 %   Platelets 260 150 - 400 K/uL    EKG  EKG Interpretation None       Radiology Ct Renal Stone Study  Result Date: 03/08/2017 CLINICAL DATA:  61 y/o  F; left flank pain radiating to hips legs. EXAM: CT ABDOMEN AND PELVIS WITHOUT CONTRAST TECHNIQUE: Multidetector CT imaging of the abdomen and pelvis was performed following the standard protocol without IV contrast. COMPARISON:  12/02/2016 CT abdomen and pelvis. FINDINGS: Lower chest: No acute abnormality. Hepatobiliary: No focal liver abnormality is seen. Status post cholecystectomy. No biliary dilatation. Pancreas: Unremarkable. No pancreatic ductal dilatation or surrounding inflammatory changes. Spleen: Normal in size without focal abnormality. Adrenals/Urinary Tract: Normal adrenal glands. Mild atrophy of the right kidney. Mild left hydronephrosis and perinephric stranding secondary to a  obstructing stone within the distal ureter just proximal to the ureterovesicular junction measuring 5 mm. Normal bladder. Stomach/Bowel: Stomach is within normal limits. Appendix appears normal. No evidence of bowel wall thickening, distention, or inflammatory changes. Mild sigmoid diverticulosis. Vascular/Lymphatic: Aortic atherosclerosis. No enlarged abdominal or pelvic lymph nodes. Reproductive: Uterus and bilateral adnexa are unremarkable. Other: No abdominal wall hernia or abnormality. No abdominopelvic ascites. Musculoskeletal: No fracture is seen. Moderate degenerative changes of the spine with prominent lower lumbar facet arthrosis. Grade 1 L4-5 anterolisthesis. IMPRESSION: 1. Mild left hydronephrosis and perinephric stranding secondary to obstructing stone in the distal left ureter just proximal to the ureterovesicular junction measuring up to 5 mm. 2. Post cholecystectomy. Otherwise stable chronic findings as above. Electronically Signed   By: Kristine Garbe M.D.   On: 03/08/2017 23:09    Procedures Procedures (including critical care time)  Medications Ordered in ED Medications  HYDROmorphone (DILAUDID) injection 1 mg (not administered)  ketorolac (TORADOL) 30 MG/ML injection 30 mg (not administered)  ondansetron (ZOFRAN-ODT) disintegrating tablet 4 mg (4 mg Oral Given 03/08/17 2014)     Initial Impression / Assessment and Plan / ED Course  I have reviewed the triage vital signs and the nursing notes.  Pertinent labs & imaging results that were available during my care of the patient were reviewed by me and considered in my medical decision making (see chart for details).  Iv ns. Labs.   CT.  Reviewed nursing notes and prior charts for additional history.   Dilaudid 1 mg iv. toradol iv. zofran iv.   Pain  resolved w meds.  abd soft nt.  No fever/chills. No dysuria.   Discussed ct with pt.  Her urologist is Dr Gaynelle Arabian - will have f/u there in the next few days.    Final Clinical Impressions(s) / ED Diagnoses   Final diagnoses:  None    New Prescriptions New Prescriptions   No medications on file     Lajean Saver, MD 03/08/17 2343

## 2017-03-08 NOTE — ED Notes (Signed)
Patient transported to CT 

## 2017-03-08 NOTE — Discharge Instructions (Signed)
It was our pleasure to provide your ER care today - we hope that you feel better.  Your ct was read as follows: 1. Mild left hydronephrosis and perinephric stranding secondary to obstructing stone in the distal left ureter just proximal to the ureterovesicular junction measuring up to 5 mm.  Strain urine. Drink plenty of fluids.  Take motrin or aleve as need for pain. You may also take percocet as need for pain. No driving for the next 6 hours or when taking percocet. Also, do not take tylenol or acetaminophen containing medication when taking percocet.   Take zofran as need for nausea.  Follow up with your urologist this Tuesday - call office for appointment.  Return to ER if worse, new symptoms, fevers, persistent vomiting, intractable pain, other concern.

## 2017-03-08 NOTE — ED Triage Notes (Signed)
Pt with left flank pain, radiating hips and legs, unable to urinate, feels nauseated, headache

## 2017-03-08 NOTE — ED Notes (Signed)
ED Provider at bedside. 

## 2017-03-20 ENCOUNTER — Emergency Department (HOSPITAL_COMMUNITY)
Admission: EM | Admit: 2017-03-20 | Discharge: 2017-03-20 | Disposition: A | Discharge status: Home / Self Care | Payer: Medicaid Other | Attending: Emergency Medicine | Admitting: Emergency Medicine

## 2017-03-20 ENCOUNTER — Encounter (HOSPITAL_COMMUNITY): Payer: Self-pay | Admitting: Emergency Medicine

## 2017-03-20 ENCOUNTER — Emergency Department (HOSPITAL_COMMUNITY): Payer: Medicaid Other

## 2017-03-20 DIAGNOSIS — N183 Chronic kidney disease, stage 3 (moderate): Secondary | ICD-10-CM | POA: Diagnosis not present

## 2017-03-20 DIAGNOSIS — N200 Calculus of kidney: Secondary | ICD-10-CM | POA: Insufficient documentation

## 2017-03-20 DIAGNOSIS — R51 Headache: Secondary | ICD-10-CM | POA: Insufficient documentation

## 2017-03-20 DIAGNOSIS — I129 Hypertensive chronic kidney disease with stage 1 through stage 4 chronic kidney disease, or unspecified chronic kidney disease: Secondary | ICD-10-CM | POA: Insufficient documentation

## 2017-03-20 DIAGNOSIS — Z79899 Other long term (current) drug therapy: Secondary | ICD-10-CM | POA: Diagnosis not present

## 2017-03-20 DIAGNOSIS — R109 Unspecified abdominal pain: Secondary | ICD-10-CM | POA: Diagnosis present

## 2017-03-20 LAB — CBC WITH DIFFERENTIAL/PLATELET
Basophils Absolute: 0 10*3/uL (ref 0.0–0.1)
Basophils Relative: 0 %
Eosinophils Absolute: 0 10*3/uL (ref 0.0–0.7)
Eosinophils Relative: 0 %
HEMATOCRIT: 40.3 % (ref 36.0–46.0)
HEMOGLOBIN: 13.2 g/dL (ref 12.0–15.0)
LYMPHS ABS: 1.1 10*3/uL (ref 0.7–4.0)
Lymphocytes Relative: 16 %
MCH: 32.4 pg (ref 26.0–34.0)
MCHC: 32.8 g/dL (ref 30.0–36.0)
MCV: 98.8 fL (ref 78.0–100.0)
MONO ABS: 0.4 10*3/uL (ref 0.1–1.0)
MONOS PCT: 5 %
NEUTROS ABS: 5.6 10*3/uL (ref 1.7–7.7)
NEUTROS PCT: 79 %
PLATELETS: 347 10*3/uL (ref 150–400)
RBC: 4.08 MIL/uL (ref 3.87–5.11)
RDW: 14.5 % (ref 11.5–15.5)
WBC: 7.1 10*3/uL (ref 4.0–10.5)

## 2017-03-20 LAB — COMPREHENSIVE METABOLIC PANEL
ALBUMIN: 3.9 g/dL (ref 3.5–5.0)
ALK PHOS: 67 U/L (ref 38–126)
ALT: 21 U/L (ref 14–54)
AST: 17 U/L (ref 15–41)
Anion gap: 9 (ref 5–15)
BUN: 18 mg/dL (ref 6–20)
CHLORIDE: 108 mmol/L (ref 101–111)
CO2: 24 mmol/L (ref 22–32)
CREATININE: 0.94 mg/dL (ref 0.44–1.00)
Calcium: 9 mg/dL (ref 8.9–10.3)
GFR calc Af Amer: >60 mL/min (ref >60)
GFR calc non Af Amer: >60 mL/min (ref >60)
GLUCOSE: 123 mg/dL — AB (ref 65–99)
Potassium: 4.2 mmol/L (ref 3.5–5.1)
SODIUM: 141 mmol/L (ref 135–145)
Total Bilirubin: 0.6 mg/dL (ref 0.3–1.2)
Total Protein: 7.3 g/dL (ref 6.5–8.1)

## 2017-03-20 LAB — URINALYSIS, ROUTINE W REFLEX MICROSCOPIC
Bilirubin Urine: NEGATIVE
GLUCOSE, UA: NEGATIVE mg/dL
Hgb urine dipstick: NEGATIVE
Ketones, ur: NEGATIVE mg/dL
Nitrite: NEGATIVE
PH: 5 (ref 5.0–8.0)
Protein, ur: NEGATIVE mg/dL
SPECIFIC GRAVITY, URINE: 1.024 (ref 1.005–1.030)

## 2017-03-20 MED ORDER — ONDANSETRON HCL 4 MG/2ML IJ SOLN
4.0000 mg | Freq: Once | INTRAMUSCULAR | Status: AC
  Administered 2017-03-20: 4 mg via INTRAVENOUS
  Filled 2017-03-20: qty 2

## 2017-03-20 MED ORDER — KETOROLAC TROMETHAMINE 15 MG/ML IJ SOLN
30.0000 mg | Freq: Once | INTRAMUSCULAR | Status: AC
  Administered 2017-03-20: 30 mg via INTRAVENOUS
  Filled 2017-03-20: qty 2

## 2017-03-20 MED ORDER — TAMSULOSIN HCL 0.4 MG PO CAPS: 0.4000 mg | ORAL_CAPSULE | Freq: Every day | ORAL | 0 refills | Status: DC

## 2017-03-20 MED ORDER — OXYBUTYNIN CHLORIDE ER 5 MG PO TB24: 5.0000 mg | ORAL_TABLET | Freq: Every day | ORAL | 0 refills | Status: DC

## 2017-03-20 MED ORDER — OXYCODONE HCL 5 MG PO TABS
5.0000 mg | ORAL_TABLET | ORAL | 0 refills | Status: DC | PRN
Start: 2017-03-20 — End: 2017-05-28

## 2017-03-20 MED ORDER — HYDROMORPHONE HCL 1 MG/ML IJ SOLN
1.0000 mg | Freq: Once | INTRAMUSCULAR | Status: AC
  Administered 2017-03-20: 1 mg via INTRAVENOUS
  Filled 2017-03-20: qty 1

## 2017-03-20 NOTE — ED Notes (Signed)
Patient requested that she could wait for blood work from IV- will notify RN.

## 2017-03-20 NOTE — ED Provider Notes (Signed)
[facility] DEPT Provider Note   CSN: [id number] Arrival date & time: 03/20/17  1420     History   Chief Complaint Chief Complaint  Patient presents with  . Flank Pain  . Headache    HPI Ann [name] is a 61 y.o. female.  HPI  Patient, with past medical history of prior kidney stones, hypertension, presents to ED for evaluation of 2 week history of left-sided flank pain. She was seen here initially when symptoms beganand she did follow up with her urologist. She states that her urologist told her to take 2 tablespoons of baking soda daily for 2 weeks to help pass the stone. However, her PCP told her that she should not do to this due to her blood pressure issues. She reports pain that has been somewhat controlled with oxycodone on but she is continuing to have high blood pressures due to her pain. She states that she has never passed a kidney stone on her own and has required stents and lithotripsy in the past. When she went to see her urologist told her that they would not place a stent at this time. She did take an extra dose of her blood pressure medication to help control her hypertension.  Past Medical History:  Diagnosis Date  . Adenoma of colon   . Allergy    seasonal  . Anemia    with past pregnancy -not recent  . Anxiety   . Arthritis   . Borderline diabetes   . Chronic cystitis   . Complication of anesthesia    02-20-2014 ([facility]) INTRA-OP RESPIRATORY FAILURE SECONDARY TO POSSIBLE MUCOUS ASPIRATION/  ALSO 06-06-2013 [facility] POST-OP DESATURATION  EVEN USING CPAP, States some issues if Propofol given to rapidly.  . Diverticulitis   . Diverticulosis of colon   . Dyspnea   . Family history of adverse reaction to anesthesia    PER PT SISTER DIED AFTER GENERAL ANESTHESIA DUE TO UNDIAGNOSED OSA  . GERD (gastroesophageal reflux disease)   . H/O hiatal hernia   . Headache(784.0)    migraines, decreased since turning 50's  . History of chronic cough    "tight sounding  cough"  . History of esophageal dilatation   . History of kidney stones   . History of MRSA infection    3/ 2015  AXILLARY ABSCESS  . History of recurrent UTIs   . History of TIAs NO RESIDUAL   1980;  2005;   2008 PT STATES PER CT  SCARRING RIGHT SIDE OF BRAIN  . Hyperlipidemia   . Hypertension   . Kidney disease    III  . Neuromuscular disorder ([place])    HH  . Neuropathy    pt denies this 07-05-2015  . Nocturia   . OSA on CPAP    PER SLEEP STUDY 09-08-2012  MODERATE OSA. not used in awhile, but thinks needs to start back using due to some weight gain.  . Sleep apnea   . Stroke [facility]) [phone]   TIA'S  . SUI (stress urinary incontinence, female)     Patient Active Problem List   Diagnosis Date Noted  . Symptomatic cholelithiasis 02/10/2017  . Hypoxia 01/11/2017  . Dyspnea 01/11/2017  . Dysphagia 04/04/2015  . History of esophageal stricture 04/04/2015  . Morbid (severe) obesity due to excess calories ([place]) 04/01/2015  . ACE-inhibitor cough 03/12/2015  . Upper airway cough syndrome 03/12/2015  . Laryngopharyngeal reflux (LPR) 03/12/2015  . Elevated lipids 07/18/2014  . Chest pain 07/18/2014  . Internal  hemorrhoids 04/25/2014  . Ureteral stone with hydronephrosis 04/22/2014  . Post-op pain 04/21/2014  . Rectocele, grade 3 02/20/2014  . Female rectocele without uterine prolapse 08/04/2013  . Unspecified constipation 07/23/2013  . Chronic respiratory failure with hypoxia ([place]) 06/07/2013  . Cystocele 06/06/2013  . Anosmia 01/25/2013  . Unspecified nutritional deficiency   . Polyneuropathy in other diseases classified elsewhere ([place])   . OSA (obstructive sleep apnea)   . Cough 07/27/2012  . Essential hypertension     Past Surgical History:  Procedure Laterality Date  . ANTERIOR AND POSTERIOR REPAIR N/A 06/06/2013   Procedure: Anterior vaginal vault repair, Sacrospinous ligament fixation with UPHOLD lite, Kelly plication, Sacrospinous mesh fixation, Solyx  transurethral sling;  Surgeon: Ailene Rud, MD;  Location: [facility];  Service: Urology;  Laterality: N/A;  . CHOLECYSTECTOMY N/A 02/10/2017   Procedure: LAPAROSCOPIC CHOLECYSTECTOMY;  Surgeon: Coralie Keens, MD;  Location: [facility];  Service: General;  Laterality: N/A;  . CYSTOSCOPY W/ URETERAL STENT PLACEMENT Right 04/21/2014   Procedure: CYSTOSCOPY WITH RETROGRADE PYELOGRAM/URETERAL STENT PLACEMENT;  Surgeon: Ailene Rud, MD;  Location: [facility];  Service: Urology;  Laterality: Right;  . CYSTOSCOPY W/ URETERAL STENT PLACEMENT Right 11/21/2014   Procedure: CYSTOSCOPY WITH RETROGRADE PYELOGRAM, URETEROSCOPY WITH STONE REMOVAL, URETERAL STENT PLACEMENT;  Surgeon: Carolan Clines, MD;  Location: [facility];  Service: Urology;  Laterality: Right;  . CYSTOSCOPY W/ URETERAL STENT PLACEMENT Left 02/01/2016   Procedure: CYSTO, LEFT URETEROSCOPY, LEFT RETROGRADE AND LEFT URETERAL STENT PLACEMENT;  Surgeon: Carolan Clines, MD;  Location: [facility];  Service: Urology;  Laterality: Left;  . CYSTOSCOPY WITH RETROGRADE PYELOGRAM, URETEROSCOPY AND STENT PLACEMENT Right 06/05/2014   Procedure: CYSTOSCOPY WITH RIGHT RETROGRADE PYELOGRAM, RIGHT URETEROSCOPY, STONE EXTRACTION AND RIGHT DOUBLE J STENT PLACEMENT;  Surgeon: Ailene Rud, MD;  Location: [facility];  Service: Urology;  Laterality: Right;  . ESOPHAGEAL DILATION  X2  LAST ONE  JUNE 2014   has had 9 of these  . ESOPHAGEAL MANOMETRY N/A 09/10/2015   Procedure: ESOPHAGEAL MANOMETRY (EM);  Surgeon: Manus Gunning, MD;  [facility];  Service: Gastroenterology;  Laterality: N/A;  . HOLMIUM LASER APPLICATION Right 29/56/2130   Procedure: HOLMIUM LASER OF STONE ;  Surgeon: Ailene Rud, MD;  Location: [facility];  Service: Urology;  Laterality: Right;  . KNEE ARTHROSCOPY Bilateral 2008  . LAPAROSCOPIC CHOLECYSTECTOMY  02/10/2017  . MULTIPLE TOOTH EXTRACTIONS     past  hx  . RECTOCELE REPAIR N/A 02/20/2014   Procedure: POSTERIOR REPAIR (RECTOCELE) WITH VAGINAL VAULT REPAIR WITH ACELL GRAFT PLACEMENT, PERINEOPLASTY, ENTEROCELE REPAIR;  Surgeon: Ailene Rud, MD;  Location: [facility];  Service: Urology;  Laterality: N/A;  . TUBAL LIGATION  1987    OB History    No data available       Home Medications    Prior to Admission medications   Medication Sig Start Date End Date Taking? Authorizing Provider  amLODipine (NORVASC) 10 MG tablet Take 10 mg by mouth daily.    Yes [provider]  chlorpheniramine (CHLOR-TRIMETON) 4 MG tablet Take 1 tablet (4 mg total) by mouth at bedtime. 03/08/15  Yes Chesley Mires, MD  clonazePAM (KLONOPIN) 0.5 MG tablet Take 0.5 mg by mouth 2 (two) times daily as needed for anxiety.    Yes [provider]  metoprolol succinate (TOPROL-XL) 50 MG 24 hr tablet Take 50 mg by mouth at bedtime.  08/08/15  Yes [provider]  omeprazole (  PRILOSEC) 40 MG capsule TAKE 1 CAPSULE(40 MG) BY MOUTH TWICE DAILY 09/17/16  Yes Armbruster, [name], MD  ondansetron (ZOFRAN ODT) 8 MG disintegrating tablet Take 1 tablet (8 mg total) by mouth every 8 (eight) hours as needed for nausea or vomiting. 03/08/17  Yes Lajean Saver, MD  oxyCODONE-acetaminophen (PERCOCET/ROXICET) 5-325 MG tablet Take 1-2 tablets by mouth every 6 (six) hours as needed for severe pain. 03/08/17  Yes Lajean Saver, MD  topiramate (TOPAMAX) 50 MG tablet Take 50 mg by mouth 2 (two) times daily.   Yes [provider]  UNABLE TO FIND Med Name: CPAP   Yes [provider]  albuterol (PROAIR HFA) 108 (90 Base) MCG/ACT inhaler Inhale 2 puffs into the lungs every 4 (four) hours as needed. For cough wheezing. 08/07/15   [provider]  albuterol (PROVENTIL) (2.5 MG/3ML) 0.083% nebulizer solution Inhale 3 mLs into the lungs every 6 (six) hours as needed. Wheezing/shortness of breath 08/07/15   [provider]  oxybutynin (DITROPAN XL)  5 MG 24 hr tablet Take 1 tablet (5 mg total) by mouth at bedtime. 03/20/17   Evelynn Hench, PA-C  oxyCODONE (ROXICODONE) 5 MG immediate release tablet Take 1 tablet (5 mg total) by mouth every 4 (four) hours as needed for severe pain. 03/20/17   Lucca Ballo, PA-C  polyethylene glycol (MIRALAX / GLYCOLAX) packet Take 17 g by mouth daily as needed for moderate constipation. 01/16/17   Bonnielee Haff, MD  promethazine (PHENERGAN) 12.5 MG tablet Take 1 tablet (12.5 mg total) by mouth every 6 (six) hours as needed for nausea or vomiting. 02/11/17   Coralie Keens, MD  tamsulosin (FLOMAX) 0.4 MG CAPS capsule Take 1 capsule (0.4 mg total) by mouth daily after supper. 03/20/17   Delia Heady, PA-C    Family History Family History  Problem Relation Age of Onset  . Colon cancer Father 50       died at 74  . Heart disease Mother        died at 84  . Heart attack Mother   . Heart attack Sister        died at 58  . Hypertension Brother   . Heart attack Cousin 14       with death  . Heart attack Cousin 56       died with MI  . Esophageal cancer Neg Hx   . Rectal cancer Neg Hx   . Stomach cancer Neg Hx   . Colon polyps Neg Hx     Social History Social History  Substance Use Topics  . Smoking status: Never Smoker  . Smokeless tobacco: Never Used  . Alcohol use No     Allergies   Doxycycline and Metronidazole   Review of Systems Review of Systems  Constitutional: Negative for appetite change, chills and fever.  HENT: Negative for ear pain, rhinorrhea, sneezing and sore throat.   Eyes: Positive for visual disturbance. Negative for photophobia.  Respiratory: Negative for cough, chest tightness, shortness of breath and wheezing.   Cardiovascular: Negative for chest pain and palpitations.  Gastrointestinal: Positive for nausea. Negative for abdominal pain, blood in stool, constipation, diarrhea and vomiting.  Genitourinary: Positive for dysuria and flank pain. Negative for hematuria and  urgency.  Musculoskeletal: Negative for myalgias.  Skin: Negative for rash.  Neurological: Positive for headaches. Negative for dizziness, weakness and light-headedness.     Physical Exam Updated Vital Signs BP (!) 164/75 (BP Location: Right Arm)   Pulse 68  Temp 98.1 F (36.7 C) (Oral)   Resp 16   SpO2 98%   Physical Exam  Constitutional: She appears well-developed and well-nourished. No distress.  Nontoxic appearing but does appear uncomfortable.  HENT:  Head: Normocephalic and atraumatic.  Nose: Nose normal.  Eyes: Conjunctivae and EOM are normal. Left eye exhibits no discharge. No scleral icterus.  Neck: Normal range of motion. Neck supple.  Cardiovascular: Normal rate, regular rhythm, normal heart sounds and intact distal pulses.  Exam reveals no gallop and no friction rub.   No murmur heard. Pulmonary/Chest: Effort normal and breath sounds normal. No respiratory distress.  Abdominal: Soft. Bowel sounds are normal. She exhibits no distension. There is no tenderness. There is no guarding.  Left-sided CVA tenderness.  Musculoskeletal: Normal range of motion. She exhibits no edema.  Neurological: She is alert. No cranial nerve deficit or sensory deficit. She exhibits normal muscle tone. Coordination normal.  Skin: Skin is warm and dry. No rash noted.  Psychiatric: She has a normal mood and affect.  Nursing note and vitals reviewed.    ED Treatments / Results  Labs (all labs ordered are listed, but only abnormal results are displayed) Labs Reviewed  URINALYSIS, ROUTINE W REFLEX MICROSCOPIC - Abnormal; Notable for the following:       Result Value   APPearance HAZY (*)    Leukocytes, UA SMALL (*)    Bacteria, UA RARE (*)    Squamous Epithelial / LPF 0-5 (*)    All other components within normal limits  COMPREHENSIVE METABOLIC PANEL - Abnormal; Notable for the following:    Glucose, Bld 123 (*)    All other components within normal limits  CBC WITH  DIFFERENTIAL/PLATELET  CBC WITH DIFFERENTIAL/PLATELET    EKG  EKG Interpretation None       Radiology Dg Abdomen 1 View  Result Date: 03/20/2017 CLINICAL DATA:  Nephrolithiasis and flank pain. EXAM: ABDOMEN - 1 VIEW COMPARISON:  CT abdomen pelvis 03/08/2017 FINDINGS: There is a 2 mm calcification near the expected location of the left ureterovesical junction, which may be the stone previously identified on CT. No other nephrolithiasis or ureterolithiasis. IMPRESSION: 2 mm calculus of the lower left pelvis. This could be a small stone at the ureterovesical junction, as previously demonstrated on CT. However, a prostate calcification or pelvic venous phlebolith could also project in this location. Consider repeat CT abdomen/pelvis without contrast if clinically indicated. Electronically Signed   By: Ulyses Jarred M.D.   On: 03/20/2017 17:56    Procedures Procedures (including critical care time)  Medications Ordered in ED Medications  HYDROmorphone (DILAUDID) injection 1 mg (1 mg Intravenous Given 03/20/17 1637)  ondansetron (ZOFRAN) injection 4 mg (4 mg Intravenous Given 03/20/17 1702)  ketorolac (TORADOL) 15 MG/ML injection 30 mg (30 mg Intravenous Given 03/20/17 2033)  HYDROmorphone (DILAUDID) injection 1 mg (1 mg Intravenous Given 03/20/17 2033)     Initial Impression / Assessment and Plan / ED Course  I have reviewed the triage vital signs and the nursing notes.  Pertinent labs & imaging results that were available during my care of the patient were reviewed by me and considered in my medical decision making (see chart for details).     Patient presents to ED for two-week history of left-sided flank pain. She did follow up with her urologist since she does have a history of kidney stones but they did not elect to have a stent placed at this time. She was seen here initially 2 weeks ago  and was diagnosed with left-sided nephrolithiasis. She states that she is continuing to have pain  which is causing her blood pressure to increase. She has increased the dose of her blood pressure medications per her PCP. On physical exam she does appear uncomfortable but nontoxic. She is able to speak in complete sentences. She does have left-sided CVA tenderness present. She is afebrile. She is hypertensive. Urinalysis with small leukocytes and rare bacteria similar to prior. CBC unremarkable. CMP with normal BUN/creatinine. KUB showed 45mm calculus in the left lower pelvis. Spoke to urology on-call who stated that patient should be put on Flomax, ditropan and pain medications and advised her to follow up at urology clinic for further evaluation.Patient states that she wants to find a new urologist because she has been unable to pass a stone in the past without a stent. I told her that I did speak to urology and this is what they recommended. Patient's pain controlled here with Dilaudid and Toradol.Blood pressures have improved the patient appears much more comfortable. Patient appears stable for discharge at this time. Strict return precautions given.  Final Clinical Impressions(s) / ED Diagnoses   Final diagnoses:  Kidney stone    New Prescriptions Discharge Medication List as of 03/20/2017  8:18 PM    START taking these medications   Details  oxybutynin (DITROPAN XL) 5 MG 24 hr tablet Take 1 tablet (5 mg total) by mouth at bedtime., Starting Fri 03/20/2017, Print    tamsulosin (FLOMAX) 0.4 MG CAPS capsule Take 1 capsule (0.4 mg total) by mouth daily after supper., Starting Fri 03/20/2017, Print         [place], [place], PA-C 03/21/17 0032    Daleen Bo, MD 03/22/17 [phone]

## 2017-03-20 NOTE — ED Triage Notes (Addendum)
Pt reports ongoing flank pain for the past 2 weeks. Pt reports she has a diagnosed kidney stone. Pt went to urologist who recommended taking baking soda. Pt has not tried baking soda because her PCP told her not to. Pt wants a stent because she has been unable to pass kidney stone before. Pt also reports she has a HA and blurred vision. Hx of HTN. Took extra BP medications prior to arrival.

## 2017-03-20 NOTE — Discharge Instructions (Signed)
Take Flomax, Ditropan as directed. Take oxycodone as needed for pain. Follow up with urology listed below for further evaluation. Return to ED for worsening pain, fever, abdominal pain, blood in stool, trouble breathing.

## 2017-03-20 NOTE — ED Notes (Signed)
Bed: [facility] Expected date:  Expected time:  Means of arrival:  Comments: 

## 2017-04-10 ENCOUNTER — Ambulatory Visit: Payer: Medicaid Other | Admitting: Pulmonary Disease

## 2017-04-20 ENCOUNTER — Ambulatory Visit: Payer: Medicaid Other | Admitting: Adult Health

## 2017-05-05 ENCOUNTER — Other Ambulatory Visit: Payer: Self-pay | Admitting: Nurse Practitioner

## 2017-05-05 MED ORDER — LOSARTAN POTASSIUM 50 MG PO TABS: 50.0000 mg | ORAL_TABLET | Freq: Every day | ORAL | 3 refills | Status: DC

## 2017-05-05 NOTE — Progress Notes (Signed)
Here seeing her husband for the visit today - she tells me of how bad she is feeling - she has noted marked elevation in BP. Probably getting too much salt.  Planning to go to the ER later today. Lab from September noted. Normal kidney function.   Seeing PA in the office in mid November  ACE allergic - cough reported  BP is 150/100 by me.   Will add Losartan 50 mg a day - keep follow up as planned with plans for lab and further med changes as needed.   She was quite appreciative of my help.   Burtis Junes, RN, [facility] [address] [place], [place]  [address] [phone]

## 2017-05-18 ENCOUNTER — Encounter (HOSPITAL_COMMUNITY): Payer: Self-pay

## 2017-05-18 ENCOUNTER — Emergency Department (HOSPITAL_COMMUNITY): Payer: Medicaid Other

## 2017-05-18 ENCOUNTER — Other Ambulatory Visit: Payer: Self-pay

## 2017-05-18 ENCOUNTER — Inpatient Hospital Stay (HOSPITAL_COMMUNITY)
Admission: EM | Admit: 2017-05-18 | Discharge: 2017-05-28 | DRG: 392 | Disposition: A | Discharge status: Home / Self Care | Payer: Medicaid Other | Attending: Internal Medicine | Admitting: Internal Medicine

## 2017-05-18 DIAGNOSIS — R1032 Left lower quadrant pain: Secondary | ICD-10-CM | POA: Diagnosis not present

## 2017-05-18 DIAGNOSIS — Z9981 Dependence on supplemental oxygen: Secondary | ICD-10-CM

## 2017-05-18 DIAGNOSIS — I1310 Hypertensive heart and chronic kidney disease without heart failure, with stage 1 through stage 4 chronic kidney disease, or unspecified chronic kidney disease: Secondary | ICD-10-CM | POA: Diagnosis present

## 2017-05-18 DIAGNOSIS — G4733 Obstructive sleep apnea (adult) (pediatric): Secondary | ICD-10-CM | POA: Diagnosis present

## 2017-05-18 DIAGNOSIS — N183 Chronic kidney disease, stage 3 (moderate): Secondary | ICD-10-CM | POA: Diagnosis present

## 2017-05-18 DIAGNOSIS — R740 Nonspecific elevation of levels of transaminase and lactic acid dehydrogenase [LDH]: Secondary | ICD-10-CM | POA: Diagnosis present

## 2017-05-18 DIAGNOSIS — E785 Hyperlipidemia, unspecified: Secondary | ICD-10-CM | POA: Diagnosis present

## 2017-05-18 DIAGNOSIS — R945 Abnormal results of liver function studies: Secondary | ICD-10-CM | POA: Diagnosis not present

## 2017-05-18 DIAGNOSIS — Z8249 Family history of ischemic heart disease and other diseases of the circulatory system: Secondary | ICD-10-CM

## 2017-05-18 DIAGNOSIS — R197 Diarrhea, unspecified: Secondary | ICD-10-CM | POA: Diagnosis not present

## 2017-05-18 DIAGNOSIS — I10 Essential (primary) hypertension: Secondary | ICD-10-CM | POA: Diagnosis present

## 2017-05-18 DIAGNOSIS — J9611 Chronic respiratory failure with hypoxia: Secondary | ICD-10-CM | POA: Diagnosis present

## 2017-05-18 DIAGNOSIS — Z7982 Long term (current) use of aspirin: Secondary | ICD-10-CM

## 2017-05-18 DIAGNOSIS — Z8673 Personal history of transient ischemic attack (TIA), and cerebral infarction without residual deficits: Secondary | ICD-10-CM

## 2017-05-18 DIAGNOSIS — Z6841 Body Mass Index (BMI) 40.0 and over, adult: Secondary | ICD-10-CM | POA: Diagnosis not present

## 2017-05-18 DIAGNOSIS — K8020 Calculus of gallbladder without cholecystitis without obstruction: Secondary | ICD-10-CM | POA: Diagnosis present

## 2017-05-18 DIAGNOSIS — K5732 Diverticulitis of large intestine without perforation or abscess without bleeding: Principal | ICD-10-CM | POA: Diagnosis present

## 2017-05-18 DIAGNOSIS — R7989 Other specified abnormal findings of blood chemistry: Secondary | ICD-10-CM

## 2017-05-18 DIAGNOSIS — R935 Abnormal findings on diagnostic imaging of other abdominal regions, including retroperitoneum: Secondary | ICD-10-CM

## 2017-05-18 DIAGNOSIS — Z79899 Other long term (current) drug therapy: Secondary | ICD-10-CM | POA: Diagnosis not present

## 2017-05-18 DIAGNOSIS — Z792 Long term (current) use of antibiotics: Secondary | ICD-10-CM | POA: Diagnosis not present

## 2017-05-18 DIAGNOSIS — Z7951 Long term (current) use of inhaled steroids: Secondary | ICD-10-CM | POA: Diagnosis not present

## 2017-05-18 DIAGNOSIS — K449 Diaphragmatic hernia without obstruction or gangrene: Secondary | ICD-10-CM | POA: Diagnosis present

## 2017-05-18 DIAGNOSIS — K5792 Diverticulitis of intestine, part unspecified, without perforation or abscess without bleeding: Secondary | ICD-10-CM

## 2017-05-18 DIAGNOSIS — R1084 Generalized abdominal pain: Secondary | ICD-10-CM

## 2017-05-18 DIAGNOSIS — R110 Nausea: Secondary | ICD-10-CM | POA: Diagnosis not present

## 2017-05-18 LAB — COMPREHENSIVE METABOLIC PANEL
ALBUMIN: 3.4 g/dL — AB (ref 3.5–5.0)
ALK PHOS: 77 U/L (ref 38–126)
ALT: 26 U/L (ref 14–54)
AST: 31 U/L (ref 15–41)
Anion gap: 6 (ref 5–15)
BILIRUBIN TOTAL: 1.2 mg/dL (ref 0.3–1.2)
BUN: 14 mg/dL (ref 6–20)
CALCIUM: 8.7 mg/dL — AB (ref 8.9–10.3)
CO2: 28 mmol/L (ref 22–32)
CREATININE: 0.96 mg/dL (ref 0.44–1.00)
Chloride: 107 mmol/L (ref 101–111)
GFR calc Af Amer: >60 mL/min (ref >60)
GFR calc non Af Amer: >60 mL/min (ref >60)
GLUCOSE: 97 mg/dL (ref 65–99)
Potassium: 4.4 mmol/L (ref 3.5–5.1)
SODIUM: 141 mmol/L (ref 135–145)
TOTAL PROTEIN: 7.4 g/dL (ref 6.5–8.1)

## 2017-05-18 LAB — CBC
HCT: 40.7 % (ref 36.0–46.0)
HEMOGLOBIN: 13.5 g/dL (ref 12.0–15.0)
MCH: 33.2 pg (ref 26.0–34.0)
MCHC: 33.2 g/dL (ref 30.0–36.0)
MCV: 100 fL (ref 78.0–100.0)
PLATELETS: 330 10*3/uL (ref 150–400)
RBC: 4.07 MIL/uL (ref 3.87–5.11)
RDW: 13.9 % (ref 11.5–15.5)
WBC: 6.6 10*3/uL (ref 4.0–10.5)

## 2017-05-18 LAB — URINALYSIS, ROUTINE W REFLEX MICROSCOPIC
Bilirubin Urine: NEGATIVE
Glucose, UA: NEGATIVE mg/dL
Hgb urine dipstick: NEGATIVE
KETONES UR: 5 mg/dL — AB
Nitrite: NEGATIVE
PH: 6 (ref 5.0–8.0)
Protein, ur: 30 mg/dL — AB
Specific Gravity, Urine: 1.021 (ref 1.005–1.030)
WBC UA: NONE SEEN WBC/hpf (ref 0–5)

## 2017-05-18 LAB — LIPASE, BLOOD: Lipase: 22 U/L (ref 11–51)

## 2017-05-18 LAB — TROPONIN I

## 2017-05-18 MED ORDER — CIPROFLOXACIN IN D5W 400 MG/200ML IV SOLN
400.0000 mg | Freq: Two times a day (BID) | INTRAVENOUS | Status: DC
  Administered 2017-05-18 – 2017-05-19 (×2): 400 mg via INTRAVENOUS
  Filled 2017-05-18: qty 200

## 2017-05-18 MED ORDER — IOPAMIDOL (ISOVUE-300) INJECTION 61%
100.0000 mL | Freq: Once | INTRAVENOUS | Status: AC | PRN
  Administered 2017-05-18: 100 mL via INTRAVENOUS

## 2017-05-18 MED ORDER — ENOXAPARIN SODIUM 60 MG/0.6ML ~~LOC~~ SOLN
50.0000 mg | SUBCUTANEOUS | Status: DC
  Administered 2017-05-18 – 2017-05-27 (×10): 50 mg via SUBCUTANEOUS
  Filled 2017-05-18 (×10): qty 0.6

## 2017-05-18 MED ORDER — DEXTROSE-NACL 5-0.9 % IV SOLN
INTRAVENOUS | Status: DC
  Administered 2017-05-18 – 2017-05-20 (×2): via INTRAVENOUS
  Administered 2017-05-20: 1000 mL via INTRAVENOUS
  Administered 2017-05-21 – 2017-05-24 (×3): via INTRAVENOUS

## 2017-05-18 MED ORDER — KETOROLAC TROMETHAMINE 30 MG/ML IJ SOLN
30.0000 mg | Freq: Four times a day (QID) | INTRAMUSCULAR | Status: AC | PRN
  Administered 2017-05-18 – 2017-05-21 (×9): 30 mg via INTRAVENOUS
  Filled 2017-05-18 (×9): qty 1

## 2017-05-18 MED ORDER — CIPROFLOXACIN IN D5W 400 MG/200ML IV SOLN
400.0000 mg | Freq: Two times a day (BID) | INTRAVENOUS | Status: DC
  Filled 2017-05-18: qty 200

## 2017-05-18 MED ORDER — IOPAMIDOL (ISOVUE-300) INJECTION 61%
INTRAVENOUS | Status: AC
  Filled 2017-05-18: qty 100

## 2017-05-18 MED ORDER — METOPROLOL TARTRATE 5 MG/5ML IV SOLN
5.0000 mg | Freq: Four times a day (QID) | INTRAVENOUS | Status: DC
  Filled 2017-05-18: qty 5

## 2017-05-18 MED ORDER — METRONIDAZOLE IN NACL 5-0.79 MG/ML-% IV SOLN
500.0000 mg | Freq: Once | INTRAVENOUS | Status: AC
  Administered 2017-05-18: 500 mg via INTRAVENOUS
  Filled 2017-05-18: qty 100

## 2017-05-18 MED ORDER — LORAZEPAM 2 MG/ML IJ SOLN: 0.5000 mg | Freq: Four times a day (QID) | INTRAMUSCULAR | Status: DC | PRN

## 2017-05-18 MED ORDER — ACETAMINOPHEN 325 MG PO TABS
650.0000 mg | ORAL_TABLET | Freq: Four times a day (QID) | ORAL | Status: DC | PRN
  Administered 2017-05-21 – 2017-05-28 (×5): 650 mg via ORAL
  Filled 2017-05-18 (×5): qty 2

## 2017-05-18 MED ORDER — METOPROLOL TARTRATE 5 MG/5ML IV SOLN
2.5000 mg | Freq: Four times a day (QID) | INTRAVENOUS | Status: DC
  Filled 2017-05-18 (×2): qty 5

## 2017-05-18 MED ORDER — HYDRALAZINE HCL 20 MG/ML IJ SOLN: 5.0000 mg | INTRAMUSCULAR | Status: DC | PRN

## 2017-05-18 MED ORDER — ONDANSETRON HCL 4 MG PO TABS
4.0000 mg | ORAL_TABLET | Freq: Four times a day (QID) | ORAL | Status: DC | PRN
  Administered 2017-05-26: 4 mg via ORAL
  Filled 2017-05-18: qty 1

## 2017-05-18 MED ORDER — ONDANSETRON HCL 4 MG/2ML IJ SOLN
4.0000 mg | Freq: Once | INTRAMUSCULAR | Status: AC
  Administered 2017-05-18: 4 mg via INTRAVENOUS
  Filled 2017-05-18: qty 2

## 2017-05-18 MED ORDER — ACETAMINOPHEN 650 MG RE SUPP: 650.0000 mg | Freq: Four times a day (QID) | RECTAL | Status: DC | PRN

## 2017-05-18 MED ORDER — SODIUM CHLORIDE 0.9 % IV BOLUS (SEPSIS)
1000.0000 mL | Freq: Once | INTRAVENOUS | Status: AC
  Administered 2017-05-18: 1000 mL via INTRAVENOUS

## 2017-05-18 MED ORDER — HYDROMORPHONE HCL 1 MG/ML IJ SOLN
1.0000 mg | Freq: Once | INTRAMUSCULAR | Status: AC
  Administered 2017-05-18: 1 mg via INTRAVENOUS
  Filled 2017-05-18: qty 1

## 2017-05-18 MED ORDER — METRONIDAZOLE IN NACL 5-0.79 MG/ML-% IV SOLN
500.0000 mg | Freq: Three times a day (TID) | INTRAVENOUS | Status: DC
  Administered 2017-05-19 – 2017-05-24 (×17): 500 mg via INTRAVENOUS
  Filled 2017-05-18 (×18): qty 100

## 2017-05-18 MED ORDER — MORPHINE SULFATE (PF) 2 MG/ML IV SOLN
2.0000 mg | INTRAVENOUS | Status: DC | PRN
  Administered 2017-05-18 – 2017-05-20 (×7): 2 mg via INTRAVENOUS
  Filled 2017-05-18 (×8): qty 1

## 2017-05-18 MED ORDER — ONDANSETRON HCL 4 MG/2ML IJ SOLN
4.0000 mg | Freq: Four times a day (QID) | INTRAMUSCULAR | Status: DC | PRN
  Administered 2017-05-18 – 2017-05-27 (×5): 4 mg via INTRAVENOUS
  Filled 2017-05-18 (×9): qty 2

## 2017-05-18 NOTE — Progress Notes (Signed)
Pharmacy Antibiotic Note  Ann [name] is a 61 y.o. female admitted on 05/18/2017 with IAI.  Pharmacy has been consulted for cipro dosing.  Plan: cipro 400 mg IV q12 Pharmacy to sign off  Height: 5\' 2"  (157.5 cm) Weight: 226 lb (102.5 kg) IBW/kg (Calculated) : 50.1  Temp (24hrs), Avg:97.7 F (36.5 C), Min:97.7 F (36.5 C), Max:97.7 F (36.5 C)  Recent Labs  Lab 05/18/17 1230  WBC 6.6  CREATININE 0.96    Estimated Creatinine Clearance: 71.7 mL/min (by C-G formula based on SCr of 0.96 mg/dL).    Allergies  Allergen Reactions  . Lisinopril Cough  . Doxycycline Diarrhea    Severe headaches  . Metronidazole Other (See Comments)    Severe headaches   Thank you for allowing pharmacy to be a part of this patient's care. Eudelia Bunch, Pharm.D. [phone] 05/18/2017 4:06 PM

## 2017-05-18 NOTE — H&P (Signed)
History and Physical    Ann [name] QBH:[id number] DOB: Aug 13, 1955 DOA: 05/18/2017  PCP: Berkley Harvey, NP  Patient coming from: Home  Chief Complaint: Abdominal pain  HPI: Ann [name] is a 61 y.o. 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  Patient initially noted lower quadrant pain that started approximately 1 week prior to this hospital admission.  Symptoms were made worse with food, sometimes relieved with passing flatus.  Patient was prescribed oral Cipro and Flagyl by her PCP for which she started course on 05/15/2017.  Symptoms did not improved, in fact got worse.  Patient subsequently presented to the emergency department for further evaluation.  ED Course: In the emergency department, patient underwent abdominal CT with findings of severe diverticulitis involving the sigmoid colon which spans over 12.5 cm.  No extraluminal drainable abscess or free intraperitoneal air is noted.  She was started on IV Cipro and Flagyl in the emergency department. Of note, patient was noted to have no leukocytosis and noted to be afebrile.  Hospitalist service consulted for consideration for admission.  Review of Systems:  Review of Systems  Constitutional: Negative for chills, fever and weight loss.  HENT: Negative for ear discharge, ear pain, nosebleeds and sinus pain.   Eyes: Negative for blurred vision, double vision and pain.  Respiratory: Negative for hemoptysis, sputum production, shortness of breath and wheezing.   Cardiovascular: Negative for palpitations, orthopnea and claudication.  Gastrointestinal: Positive for abdominal pain. Negative for blood in stool and heartburn.  Genitourinary: Negative for flank pain, frequency and  urgency.  Musculoskeletal: Negative for back pain, joint pain and neck pain.  Neurological: Negative for tingling, focal weakness, loss of consciousness, weakness and headaches.  Psychiatric/Behavioral: Negative for hallucinations, memory loss and substance abuse. The patient does not have insomnia.     Past Medical History:  Diagnosis Date  . Adenoma of colon   . Allergy    seasonal  . Anemia    with past pregnancy -not recent  . Anxiety   . Arthritis   . Borderline diabetes   . Chronic cystitis   . Complication of anesthesia    02-20-2014 ([facility]) INTRA-OP RESPIRATORY FAILURE SECONDARY TO POSSIBLE MUCOUS ASPIRATION/  ALSO 06-06-2013 [facility]) POST-OP DESATURATION  EVEN USING CPAP, States some issues if Propofol given to rapidly.  . Diverticulitis   . Diverticulosis of colon   . Dyspnea   . Family history of adverse reaction to anesthesia    PER PT SISTER DIED AFTER GENERAL ANESTHESIA DUE TO UNDIAGNOSED OSA  . GERD (gastroesophageal reflux disease)   . H/O hiatal hernia   . Headache(784.0)    migraines, decreased since turning 50's  . History of chronic cough    "tight sounding cough"  . History of esophageal dilatation   . History of kidney stones   . History of MRSA infection    3/ 2015  AXILLARY ABSCESS  . History of recurrent UTIs   . History of TIAs NO RESIDUAL   1980;  2005;   2008 PT STATES PER CT  SCARRING RIGHT SIDE OF BRAIN  . Hyperlipidemia   . Hypertension   . Kidney disease    III  . Neuromuscular disorder ([place])    HH  . Neuropathy    pt denies  this 07-05-2015  . Nocturia   . OSA on CPAP    PER SLEEP STUDY 09-08-2012  MODERATE OSA. not used in awhile, but thinks needs to start back using due to some weight gain.  . Sleep apnea   . Stroke [facility]) [phone]   TIA'S  . SUI (stress urinary incontinence, female)     Past Surgical History:  Procedure Laterality Date  . ESOPHAGEAL DILATION  X2  LAST ONE  JUNE 2014   has had 9 of these  . KNEE ARTHROSCOPY  Bilateral 2008  . LAPAROSCOPIC CHOLECYSTECTOMY  02/10/2017  . MULTIPLE TOOTH EXTRACTIONS     past hx  . TUBAL LIGATION  1987     reports that  has never smoked. she has never used smokeless tobacco. She reports that she does not drink alcohol or use drugs.  Allergies  Allergen Reactions  . Lisinopril Cough  . Doxycycline Diarrhea    Severe headaches  . Metronidazole Other (See Comments)    Severe headaches    Family History  Problem Relation Age of Onset  . Colon cancer Father 61       died at 98  . Heart disease Mother        died at 79  . Heart attack Mother   . Heart attack Sister        died at 11  . Hypertension Brother   . Heart attack Cousin 34       with death  . Heart attack Cousin 31       died with MI  . Esophageal cancer Neg Hx   . Rectal cancer Neg Hx   . Stomach cancer Neg Hx   . Colon polyps Neg Hx     Prior to Admission medications   Medication Sig Start Date End Date Taking? Authorizing Provider  albuterol (PROAIR HFA) 108 (90 Base) MCG/ACT inhaler Inhale 2 puffs into the lungs every 4 (four) hours as needed. For cough wheezing. 08/07/15  Yes [provider]  albuterol (PROVENTIL) (2.5 MG/3ML) 0.083% nebulizer solution Inhale 3 mLs into the lungs every 6 (six) hours as needed. Wheezing/shortness of breath 08/07/15  Yes [provider]  amLODipine (NORVASC) 10 MG tablet Take 10 mg by mouth daily.    Yes [provider]  aspirin EC 81 MG tablet Take 81 mg daily by mouth.   Yes [provider]  atorvastatin (LIPITOR) 10 MG tablet Take 10 mg daily by mouth. 05/15/17  Yes [provider]  budesonide-formoterol (SYMBICORT) 160-4.5 MCG/ACT inhaler Inhale 2 puffs 2 (two) times daily into the lungs. 04/21/17 04/21/18 Yes [provider]  chlorpheniramine (CHLOR-TRIMETON) 4 MG tablet Take 1 tablet (4 mg total) by mouth at bedtime. 03/08/15  Yes Chesley Mires, MD  cholestyramine (QUESTRAN) 4 g packet Take 1 packet 4  (four) times daily by mouth. 03/24/17  Yes [provider]  ciprofloxacin (CIPRO) 500 MG tablet Take 500 mg 2 (two) times daily by mouth. For 7 days. 05/15/17  Yes [provider]  clonazePAM (KLONOPIN) 0.5 MG tablet Take 0.5 mg by mouth 2 (two) times daily as needed for anxiety.    Yes [provider]  fluticasone (FLONASE) 50 MCG/ACT nasal spray Place 2 sprays daily into both nostrils. 08/07/15  Yes [provider]  furosemide (LASIX) 40 MG tablet Take 40 mg daily by mouth. 03/06/15  Yes [provider]  losartan (COZAAR) 50 MG tablet Take 1 tablet (50 mg total) by mouth daily. 05/05/17 08/03/17  Yes Burtis Junes, NP  metoprolol succinate (TOPROL-XL) 50 MG 24 hr tablet Take 50 mg by mouth at bedtime.  08/08/15  Yes [provider]  metroNIDAZOLE (FLAGYL) 250 MG tablet Take 250 mg 3 (three) times daily by mouth. 05/15/17  Yes [provider]  omeprazole (PRILOSEC) 40 MG capsule TAKE 1 CAPSULE(40 MG) BY MOUTH TWICE DAILY 09/17/16  Yes Armbruster, [name], MD  OXYGEN Inhale into the lungs.   Yes [provider]  polyethylene glycol (MIRALAX / GLYCOLAX) packet Take 17 g by mouth daily as needed for moderate constipation. 01/16/17  Yes Bonnielee Haff, MD  promethazine (PHENERGAN) 12.5 MG tablet Take 1 tablet (12.5 mg total) by mouth every 6 (six) hours as needed for nausea or vomiting. 02/11/17  Yes Coralie Keens, MD  topiramate (TOPAMAX) 50 MG tablet Take 50 mg by mouth 2 (two) times daily.   Yes [provider]  UNABLE TO FIND Med Name: CPAP   Yes [provider]  ondansetron (ZOFRAN ODT) 8 MG disintegrating tablet Take 1 tablet (8 mg total) by mouth every 8 (eight) hours as needed for nausea or vomiting. Patient not taking: Reported on 05/18/2017 03/08/17   Lajean Saver, MD  oxybutynin (DITROPAN XL) 5 MG 24 hr tablet Take 1 tablet (5 mg total) by mouth at bedtime. Patient not taking: Reported on 05/18/2017 03/20/17    Delia Heady, PA-C  oxyCODONE (ROXICODONE) 5 MG immediate release tablet Take 1 tablet (5 mg total) by mouth every 4 (four) hours as needed for severe pain. Patient not taking: Reported on 05/18/2017 03/20/17   Delia Heady, PA-C  oxyCODONE-acetaminophen (PERCOCET/ROXICET) 5-325 MG tablet Take 1-2 tablets by mouth every 6 (six) hours as needed for severe pain. Patient not taking: Reported on 05/18/2017 03/08/17   Lajean Saver, MD  tamsulosin (FLOMAX) 0.4 MG CAPS capsule Take 1 capsule (0.4 mg total) by mouth daily after supper. Patient not taking: Reported on 05/18/2017 03/20/17   Delia Heady, PA-C    Physical Exam: Vitals:   05/18/17 1159 05/18/17 1202 05/18/17 1400  BP: (!) 173/88  127/74  Pulse: 65  98  Resp: 16  (!) 26  Temp: 97.7 F (36.5 C)    TempSrc: Oral    SpO2: 97%  94%  Weight:  102.5 kg (226 lb)   Height:  5\' 2"  (1.575 m)     Constitutional: NAD, calm, comfortable Vitals:   05/18/17 1159 05/18/17 1202 05/18/17 1400  BP: (!) 173/88  127/74  Pulse: 65  98  Resp: 16  (!) 26  Temp: 97.7 F (36.5 C)    TempSrc: Oral    SpO2: 97%  94%  Weight:  102.5 kg (226 lb)   Height:  5\' 2"  (1.575 m)    Eyes: PERRL, lids and conjunctivae normal ENMT: Mucous membranes are moist. Posterior pharynx clear of any exudate or lesions.Normal dentition.  Neck: normal, supple, no masses, no thyromegaly Respiratory: clear to auscultation bilaterally, no wheezing, no crackles. Normal respiratory effort. No accessory muscle use.  Cardiovascular: Regular rate and rhythm, no murmurs / rubs / gallops. No extremity edema. 2+ pedal pulses. No carotid bruits.  Abdomen: Decreased bowel sounds on exam, generally tender but worse in the lower quadrants bilaterally.  Musculoskeletal: no clubbing / cyanosis. No joint deformity upper and lower extremities. Good ROM, no contractures. Normal muscle tone.  Skin: no rashes, lesions, ulcers. No induration Neurologic: CN 2-12 grossly intact. Sensation intact,  DTR normal. Strength 5/5 in all 4.  Psychiatric: Normal  judgment and insight. Alert and oriented x 3. Normal mood.    Labs on Admission: I have personally reviewed following labs and imaging studies  CBC: Recent Labs  Lab 05/18/17 1230  WBC 6.6  HGB 13.5  HCT 40.7  MCV 100.0  PLT 510   Basic Metabolic Panel: Recent Labs  Lab 05/18/17 1230  NA 141  K 4.4  CL 107  CO2 28  GLUCOSE 97  BUN 14  CREATININE 0.96  CALCIUM 8.7*   GFR: Estimated Creatinine Clearance: 71.7 mL/min (by C-G formula based on SCr of 0.96 mg/dL). Liver Function Tests: Recent Labs  Lab 05/18/17 1230  AST 31  ALT 26  ALKPHOS 77  BILITOT 1.2  PROT 7.4  ALBUMIN 3.4*   Recent Labs  Lab 05/18/17 1230  LIPASE 22   No results for input(s): AMMONIA in the last 168 hours. Coagulation Profile: No results for input(s): INR, PROTIME in the last 168 hours. Cardiac Enzymes: No results for input(s): CKTOTAL, CKMB, CKMBINDEX, TROPONINI in the last 168 hours. BNP (last 3 results) No results for input(s): PROBNP in the last 8760 hours. HbA1C: No results for input(s): HGBA1C in the last 72 hours. CBG: No results for input(s): GLUCAP in the last 168 hours. Lipid Profile: No results for input(s): CHOL, HDL, LDLCALC, TRIG, CHOLHDL, LDLDIRECT in the last 72 hours. Thyroid Function Tests: No results for input(s): TSH, T4TOTAL, FREET4, T3FREE, THYROIDAB in the last 72 hours. Anemia Panel: No results for input(s): VITAMINB12, FOLATE, FERRITIN, TIBC, IRON, RETICCTPCT in the last 72 hours. Urine analysis:    Component Value Date/Time   COLORURINE YELLOW 05/18/2017 1402   APPEARANCEUR HAZY (A) 05/18/2017 1402   LABSPEC 1.021 05/18/2017 1402   PHURINE 6.0 05/18/2017 1402   GLUCOSEU NEGATIVE 05/18/2017 1402   HGBUR NEGATIVE 05/18/2017 1402   BILIRUBINUR NEGATIVE 05/18/2017 1402   KETONESUR 5 (A) 05/18/2017 1402   PROTEINUR 30 (A) 05/18/2017 1402   UROBILINOGEN 0.2 02/09/2015 1322   NITRITE NEGATIVE  05/18/2017 1402   LEUKOCYTESUR TRACE (A) 05/18/2017 1402   Sepsis Labs: !!!!!!!!!!!!!!!!!!!!!!!!!!!!!!!!!!!!!!!!!!!! @LABRCNTIP (procalcitonin:4,lacticidven:4) )No results found for this or any previous visit (from the past 240 hour(s)).   Radiological Exams on Admission: Ct Abdomen Pelvis W Contrast  Result Date: 05/18/2017 CLINICAL DATA:  61 year old female with bilateral lower abdominal pain for 2 weeks. History of diverticulitis. Recently treated with Flagyl and Cipro. Pain is worse. Initial encounter. EXAM: CT ABDOMEN AND PELVIS WITH CONTRAST TECHNIQUE: Multidetector CT imaging of the abdomen and pelvis was performed using the standard protocol following bolus administration of intravenous contrast. CONTRAST:  163mL ISOVUE-300 IOPAMIDOL (ISOVUE-300) INJECTION 61% COMPARISON:  03/08/2017, 12/02/2016, 11/19/2015 and 04/20/2014 CT. FINDINGS: Lower chest: Minimal basilar scarring. Heart top-normal slightly enlarged. Small hiatal hernia. Hepatobiliary: Top-normal size liver. No worrisome hepatic lesion. Post cholecystectomy. No calcified common bile duct stone. Pancreas: No pancreatic mass or inflammation. Spleen: No splenic mass or enlargement. Adrenals/Urinary Tract: Slightly atrophic right kidney with chronic pelvis/ caliceal prominence which may reflect result of prior obstruction as noted on prior exams. No left renal collecting system obstruction. Minimally lobulated contour with tiny hyperdense lesion inferior aspect left kidney unchanged from 2015 exam. No adrenal mass. Contracted noncontrast filled urinary bladder unremarkable. Stomach/Bowel: Marked diverticulitis sigmoid colon spanning over 12.5 cm. Although there may be an abscess within the wall of inflamed sigmoid colon, no extraluminal drainable abscess or free intraperitoneal air is noted. This segment of bowel can be assessed on follow-up after acute episode has cleared. Inflamed sigmoid colon contacts  the left adnexae and posterosuperior  aspect of the uterus. Small hiatal hernia.  No inflammation surrounds the appendix. Vascular/Lymphatic: Mild calcification lower abdominal aorta. No aortic aneurysm or large vessel occlusion. Increase number of pelvic lymph nodes greater on the left and lower abdominal aortic region felt to be reactive in origin. Reproductive: No primary adnexal abnormality or uterine abnormality. Other: Mild diastases rectus muscles. Musculoskeletal: Degenerative changes lumbar spine. IMPRESSION: Marked diverticulitis sigmoid colon spanning over 12.5 cm. Although there may be an abscess within the wall of inflamed sigmoid colon, no extraluminal drainable abscess or free intraperitoneal air is noted. This segment of bowel can be assessed on follow-up after acute episode has cleared. Inflamed sigmoid colon contacts the left adnexae and posterosuperior aspect of the uterus. Pelvic lymph nodes greater on the left felt to be reactive in origin. Small hiatal hernia. No obstructing renal/ureteral calculi. Electronically Signed   By: Genia Del M.D.   On: 05/18/2017 15:09    Assessment/Plan Principal Problem:   Diverticulitis large intestine Active Problems:   Essential hypertension   OSA (obstructive sleep apnea)   Chronic respiratory failure with hypoxia (HCC)   Morbid (severe) obesity due to excess calories (HCC)   Symptomatic cholelithiasis   1. Diverticulitis 1. CT personally reviewed, findings of marked diverticulitis involving the sigmoid colon spanning over 12.5cm, not improved with oral medications prior to admit 2. Will continue patient on IV cipro/flagyl 3. No leukocytosis 4. Keep NPO for bowel rest with basal IV fluids 5. Monitor for improvement. If no significant improvement, then would consider repeat imaging and possible surgical consultation at that time 6. Given multiple recurrent episodes of diverticulitis (patient recounts at least 4-5 episodes this past year), consider follow-up with general  surgery as an outpatient when acute diverticulitis has resolved. 2. HTN 1. BP suboptimally controlled 2. Cont on scheduled IV bp meds as tolerated 3. Chronic hypoxemic respiratory failure on home O2 1. Cont home o2 as tolerated 4. Hx OSA 1. Appears stable at this time 2. CPAP at home, will continue 5. Morbid obesity 1. Currently stable   DVT prophylaxis: Lovenox subQ  Code Status: Full Family Communication: Pt in room  Disposition Plan: Uncertain at this time  Consults called:  Admission status: Inpatient, as would likely require geater than 2 midnight stay to manage diverticulitis   Ahonesty Woodfin, Orpah Melter MD [facility] Pager [phone]  If 7PM-7AM, please contact night-coverage [email or web address] Password [facility]  05/18/2017, 3:40 PM

## 2017-05-18 NOTE — Progress Notes (Signed)
Late entry: Approximately 1815 pt arrived from the ED, stated she felt naeueous, dizzy, heated, skin was clammy. Pt stated she felt "weird" and stated she felt this way before her last stroke.  MD and Rapid [facility] was called.  Will continue to monitor.

## 2017-05-18 NOTE — Progress Notes (Signed)
PHARMACY - ENOXAPARIN  pharmacy has been asked to adjust enoxaparin (lovenox) dose as needed in this patient for DVT prophylaxis.    Hgb: 13.5 PLTC: 330 Height: 62 inches Weight: 102.5 kg BMI: 41 SCr: 0.96 CrCl: 71.7 ml/min  Assessment:  BMI > 30 and CrCl > 41ml/min; therefore meets criteria for adjusting dose per manufacturer recommendations to 0.5 mg/kg/q24h  Plan: Change Enoxaparin to 50mg  sq q24h  Leone Haven, PharmD

## 2017-05-18 NOTE — ED Triage Notes (Signed)
Patient c/o bilateral lower abdominal pain x 2 weeks. Patient states a history of diverticulitis and recently saw her PCP and was given Flagyl and Cipro. Patient states the pain is worse. Patient called her PCP today and was told to come to the ED.Marland Kitchen

## 2017-05-18 NOTE — ED Provider Notes (Signed)
[place] DEPT Provider Note   CSN: [id number] Arrival date & time: 05/18/17  1151     History   Chief Complaint Chief Complaint  Patient presents with  . Abdominal Pain    HPI ROSABELL GEYER is a 61 y.o. female.  The history is provided by the patient. No language interpreter was used.  Abdominal Pain   This is a new problem. The current episode started more than 1 week ago. The problem occurs constantly. The problem has been gradually worsening. The pain is associated with eating. The pain is located in the RLQ and LLQ. The quality of the pain is aching. The pain is moderate. Nothing aggravates the symptoms. Nothing relieves the symptoms.  Pt reports she has had diverticulitis in the past and she feels like she has again.  Pt reports her Md put her on cipro and flagyl.  Pt reports pain is getting worse.  Pt reports she has had multiple episodes of diverticulitis.  Pt reports this is the worst pain she has had.  Pt reports she normally feels better after a couple of days of antibiotics.  Past Medical History:  Diagnosis Date  . Adenoma of colon   . Allergy    seasonal  . Anemia    with past pregnancy -not recent  . Anxiety   . Arthritis   . Borderline diabetes   . Chronic cystitis   . Complication of anesthesia    02-20-2014 ([facility]) INTRA-OP RESPIRATORY FAILURE SECONDARY TO POSSIBLE MUCOUS ASPIRATION/  ALSO 06-06-2013 [facility]) POST-OP DESATURATION  EVEN USING CPAP, States some issues if Propofol given to rapidly.  . Diverticulitis   . Diverticulosis of colon   . Dyspnea   . Family history of adverse reaction to anesthesia    PER PT SISTER DIED AFTER GENERAL ANESTHESIA DUE TO UNDIAGNOSED OSA  . GERD (gastroesophageal reflux disease)   . H/O hiatal hernia   . Headache(784.0)    migraines, decreased since turning 50's  . History of chronic cough    "tight sounding cough"  . History of esophageal dilatation   . History of kidney stones   .  History of MRSA infection    3/ 2015  AXILLARY ABSCESS  . History of recurrent UTIs   . History of TIAs NO RESIDUAL   1980;  2005;   2008 PT STATES PER CT  SCARRING RIGHT SIDE OF BRAIN  . Hyperlipidemia   . Hypertension   . Kidney disease    III  . Neuromuscular disorder ([place])    HH  . Neuropathy    pt denies this 07-05-2015  . Nocturia   . OSA on CPAP    PER SLEEP STUDY 09-08-2012  MODERATE OSA. not used in awhile, but thinks needs to start back using due to some weight gain.  . Sleep apnea   . Stroke [facility]) [phone]   TIA'S  . SUI (stress urinary incontinence, female)     Patient Active Problem List   Diagnosis Date Noted  . Symptomatic cholelithiasis 02/10/2017  . Hypoxia 01/11/2017  . Dyspnea 01/11/2017  . Dysphagia 04/04/2015  . History of esophageal stricture 04/04/2015  . Morbid (severe) obesity due to excess calories ([place]) 04/01/2015  . ACE-inhibitor cough 03/12/2015  . Upper airway cough syndrome 03/12/2015  . Laryngopharyngeal reflux (LPR) 03/12/2015  . Elevated lipids 07/18/2014  . Chest pain 07/18/2014  . Internal hemorrhoids 04/25/2014  . Ureteral stone with hydronephrosis 04/22/2014  . Post-op pain 04/21/2014  . Rectocele,  grade 3 02/20/2014  . Female rectocele without uterine prolapse 08/04/2013  . Unspecified constipation 07/23/2013  . Chronic respiratory failure with hypoxia ([place]) 06/07/2013  . Cystocele 06/06/2013  . Anosmia 01/25/2013  . Unspecified nutritional deficiency   . Polyneuropathy in other diseases classified elsewhere ([place])   . OSA (obstructive sleep apnea)   . Cough 07/27/2012  . Essential hypertension     Past Surgical History:  Procedure Laterality Date  . ESOPHAGEAL DILATION  X2  LAST ONE  JUNE 2014   has had 9 of these  . KNEE ARTHROSCOPY Bilateral 2008  . LAPAROSCOPIC CHOLECYSTECTOMY  02/10/2017  . MULTIPLE TOOTH EXTRACTIONS     past hx  . TUBAL LIGATION  1987    OB History    No data available       Home  Medications    Prior to Admission medications   Medication Sig Start Date End Date Taking? Authorizing Provider  albuterol (PROAIR HFA) 108 (90 Base) MCG/ACT inhaler Inhale 2 puffs into the lungs every 4 (four) hours as needed. For cough wheezing. 08/07/15  Yes [provider]  albuterol (PROVENTIL) (2.5 MG/3ML) 0.083% nebulizer solution Inhale 3 mLs into the lungs every 6 (six) hours as needed. Wheezing/shortness of breath 08/07/15  Yes [provider]  amLODipine (NORVASC) 10 MG tablet Take 10 mg by mouth daily.    Yes [provider]  aspirin EC 81 MG tablet Take 81 mg daily by mouth.   Yes [provider]  atorvastatin (LIPITOR) 10 MG tablet Take 10 mg daily by mouth. 05/15/17  Yes [provider]  budesonide-formoterol (SYMBICORT) 160-4.5 MCG/ACT inhaler Inhale 2 puffs 2 (two) times daily into the lungs. 04/21/17 04/21/18 Yes [provider]  chlorpheniramine (CHLOR-TRIMETON) 4 MG tablet Take 1 tablet (4 mg total) by mouth at bedtime. 03/08/15  Yes Chesley Mires, MD  cholestyramine (QUESTRAN) 4 g packet Take 1 packet 4 (four) times daily by mouth. 03/24/17  Yes [provider]  ciprofloxacin (CIPRO) 500 MG tablet Take 500 mg 2 (two) times daily by mouth. For 7 days. 05/15/17  Yes [provider]  clonazePAM (KLONOPIN) 0.5 MG tablet Take 0.5 mg by mouth 2 (two) times daily as needed for anxiety.    Yes [provider]  fluticasone (FLONASE) 50 MCG/ACT nasal spray Place 2 sprays daily into both nostrils. 08/07/15  Yes [provider]  furosemide (LASIX) 40 MG tablet Take 40 mg daily by mouth. 03/06/15  Yes [provider]  losartan (COZAAR) 50 MG tablet Take 1 tablet (50 mg total) by mouth daily. 05/05/17 08/03/17 Yes Burtis Junes, NP  metoprolol succinate (TOPROL-XL) 50 MG 24 hr tablet Take 50 mg by mouth at bedtime.  08/08/15  Yes [provider]  metroNIDAZOLE (FLAGYL) 250 MG tablet Take 250 mg  3 (three) times daily by mouth. 05/15/17  Yes [provider]  omeprazole (PRILOSEC) 40 MG capsule TAKE 1 CAPSULE(40 MG) BY MOUTH TWICE DAILY 09/17/16  Yes Armbruster, [name], MD  OXYGEN Inhale into the lungs.   Yes [provider]  polyethylene glycol (MIRALAX / GLYCOLAX) packet Take 17 g by mouth daily as needed for moderate constipation. 01/16/17  Yes Bonnielee Haff, MD  promethazine (PHENERGAN) 12.5 MG tablet Take 1 tablet (12.5 mg total) by mouth every 6 (six) hours as needed for nausea or vomiting. 02/11/17  Yes Coralie Keens, MD  topiramate (TOPAMAX) 50 MG tablet Take 50 mg by mouth 2 (two) times daily.   Yes  [provider]  UNABLE TO FIND Med Name: CPAP   Yes [provider]  ondansetron (ZOFRAN ODT) 8 MG disintegrating tablet Take 1 tablet (8 mg total) by mouth every 8 (eight) hours as needed for nausea or vomiting. Patient not taking: Reported on 05/18/2017 03/08/17   Lajean Saver, MD  oxybutynin (DITROPAN XL) 5 MG 24 hr tablet Take 1 tablet (5 mg total) by mouth at bedtime. Patient not taking: Reported on 05/18/2017 03/20/17   Delia Heady, PA-C  oxyCODONE (ROXICODONE) 5 MG immediate release tablet Take 1 tablet (5 mg total) by mouth every 4 (four) hours as needed for severe pain. Patient not taking: Reported on 05/18/2017 03/20/17   Delia Heady, PA-C  oxyCODONE-acetaminophen (PERCOCET/ROXICET) 5-325 MG tablet Take 1-2 tablets by mouth every 6 (six) hours as needed for severe pain. Patient not taking: Reported on 05/18/2017 03/08/17   Lajean Saver, MD  tamsulosin (FLOMAX) 0.4 MG CAPS capsule Take 1 capsule (0.4 mg total) by mouth daily after supper. Patient not taking: Reported on 05/18/2017 03/20/17   Delia Heady, PA-C    Family History Family History  Problem Relation Age of Onset  . Colon cancer Father 33       died at 43  . Heart disease Mother        died at 48  . Heart attack Mother   . Heart attack Sister        died at 80  .  Hypertension Brother   . Heart attack Cousin 57       with death  . Heart attack Cousin 64       died with MI  . Esophageal cancer Neg Hx   . Rectal cancer Neg Hx   . Stomach cancer Neg Hx   . Colon polyps Neg Hx     Social History Social History   Tobacco Use  . Smoking status: Never Smoker  . Smokeless tobacco: Never Used  Substance Use Topics  . Alcohol use: No    Alcohol/week: 0.0 oz  . Drug use: No     Allergies   Lisinopril; Doxycycline; and Metronidazole   Review of Systems Review of Systems  Gastrointestinal: Positive for abdominal pain.  All other systems reviewed and are negative.    Physical Exam Updated Vital Signs BP 127/74   Pulse 98   Temp 97.7 F (36.5 C) (Oral)   Resp (!) 26   Ht 5\' 2"  (1.575 m)   Wt 102.5 kg (226 lb)   SpO2 94%   BMI 41.34 kg/m   Physical Exam  Constitutional: She is oriented to person, place, and time. She appears well-developed and well-nourished.  HENT:  Head: Normocephalic.  Eyes: EOM are normal.  Neck: Normal range of motion.  Cardiovascular: Normal rate and normal heart sounds.  Pulmonary/Chest: Effort normal and breath sounds normal.  Abdominal: Soft. Normal appearance and bowel sounds are normal. She exhibits no distension and no ascites. There is tenderness in the right lower quadrant and left lower quadrant. There is no guarding. Hernia confirmed negative in the right inguinal area and confirmed negative in the left inguinal area.  Musculoskeletal: Normal range of motion.  Neurological: She is alert and oriented to person, place, and time.  Skin: Skin is warm.  Psychiatric: She has a normal mood and affect.  Nursing note and vitals reviewed.    ED Treatments / Results  Labs (all labs ordered are listed, but only abnormal results are displayed) Labs Reviewed  COMPREHENSIVE METABOLIC  PANEL - Abnormal; Notable for the following components:      Result Value   Calcium 8.7 (*)    Albumin 3.4 (*)    All  other components within normal limits  LIPASE, BLOOD  CBC  URINALYSIS, ROUTINE W REFLEX MICROSCOPIC    EKG  EKG Interpretation None       Radiology Ct Abdomen Pelvis W Contrast  Result Date: 05/18/2017 CLINICAL DATA:  61 year old female with bilateral lower abdominal pain for 2 weeks. History of diverticulitis. Recently treated with Flagyl and Cipro. Pain is worse. Initial encounter. EXAM: CT ABDOMEN AND PELVIS WITH CONTRAST TECHNIQUE: Multidetector CT imaging of the abdomen and pelvis was performed using the standard protocol following bolus administration of intravenous contrast. CONTRAST:  151mL ISOVUE-300 IOPAMIDOL (ISOVUE-300) INJECTION 61% COMPARISON:  03/08/2017, 12/02/2016, 11/19/2015 and 04/20/2014 CT. FINDINGS: Lower chest: Minimal basilar scarring. Heart top-normal slightly enlarged. Small hiatal hernia. Hepatobiliary: Top-normal size liver. No worrisome hepatic lesion. Post cholecystectomy. No calcified common bile duct stone. Pancreas: No pancreatic mass or inflammation. Spleen: No splenic mass or enlargement. Adrenals/Urinary Tract: Slightly atrophic right kidney with chronic pelvis/ caliceal prominence which may reflect result of prior obstruction as noted on prior exams. No left renal collecting system obstruction. Minimally lobulated contour with tiny hyperdense lesion inferior aspect left kidney unchanged from 2015 exam. No adrenal mass. Contracted noncontrast filled urinary bladder unremarkable. Stomach/Bowel: Marked diverticulitis sigmoid colon spanning over 12.5 cm. Although there may be an abscess within the wall of inflamed sigmoid colon, no extraluminal drainable abscess or free intraperitoneal air is noted. This segment of bowel can be assessed on follow-up after acute episode has cleared. Inflamed sigmoid colon contacts the left adnexae and posterosuperior aspect of the uterus. Small hiatal hernia.  No inflammation surrounds the appendix. Vascular/Lymphatic: Mild  calcification lower abdominal aorta. No aortic aneurysm or large vessel occlusion. Increase number of pelvic lymph nodes greater on the left and lower abdominal aortic region felt to be reactive in origin. Reproductive: No primary adnexal abnormality or uterine abnormality. Other: Mild diastases rectus muscles. Musculoskeletal: Degenerative changes lumbar spine. IMPRESSION: Marked diverticulitis sigmoid colon spanning over 12.5 cm. Although there may be an abscess within the wall of inflamed sigmoid colon, no extraluminal drainable abscess or free intraperitoneal air is noted. This segment of bowel can be assessed on follow-up after acute episode has cleared. Inflamed sigmoid colon contacts the left adnexae and posterosuperior aspect of the uterus. Pelvic lymph nodes greater on the left felt to be reactive in origin. Small hiatal hernia. No obstructing renal/ureteral calculi. Electronically Signed   By: Genia Del M.D.   On: 05/18/2017 15:09    Procedures Procedures (including critical care time)  Medications Ordered in ED Medications  sodium chloride 0.9 % bolus 1,000 mL (1,000 mLs Intravenous New Bag/Given 05/18/17 1326)  HYDROmorphone (DILAUDID) injection 1 mg (1 mg Intravenous Given 05/18/17 1326)  ondansetron (ZOFRAN) injection 4 mg (4 mg Intravenous Given 05/18/17 1326)     Initial Impression / Assessment and Plan / ED Course  I have reviewed the triage vital signs and the nursing notes.  Pertinent labs & imaging results that were available during my care of the patient were reviewed by me and considered in my medical decision making (see chart for details).     Pt's results discussed with her.  hospitalist consulted for admission.  Iv cipro and flagyl ordered.  Final Clinical Impressions(s) / ED Diagnoses   Final diagnoses:  Diverticulitis    ED Discharge Orders  None       Fransico Meadow, PA-C 05/18/17 1459    Fransico Meadow, PA-C 05/18/17 1542    Gareth Morgan, MD 05/24/17 1740

## 2017-05-18 NOTE — Significant Event (Signed)
Rapid Response Event Note  Overview:  Bedside RN called RRT for patient stating "I am either having a heart attack or a stroke; I'm not sure which."   Initial Focused Assessment: Patient alert and oriented, sitting up in bed.  Bedside RN gave admission and past medical history.  Patient denies pain after receiving Dilaudid prior to RRT arrival.  Bedside RN and patient stated that patient became nauseous and diaphoretic; pt stated she "didn't feel right."  Upon arrival patient stated that her symptoms were easing off.  Interventions: 12-Lead EKG obtained: resulted- NSR with unspecified T-wave abnormality. MD arrived at bedside and handed EKG results. Focused neuro assessment completed. Pt moving all 4 extremities, no abnormalities or deficits present upon assessment.  Vitals:  HR: 65 RR: 15 SpO2: 99 BP: 152/87 (105), 145/73 (95)  Plan of Care (if not transferred): MD ordered Troponin levels.  [place]

## 2017-05-18 NOTE — Progress Notes (Signed)
Pt has CPAP unit from home.  RT inspected for frayed cords and machine defects, none were found.  Pt's machine is working properly at this time.  RT to monitor and assess as needed.

## 2017-05-18 NOTE — Treatment Plan (Signed)
Patient seen at bedside with RRT. EKG reviewed, sinus brady. Abd nontender to moderate palpation, was much more tender when seen in ED earlier. Vitals reviewed and are stable. Will keep strict NPO. If abd exam changes, would re-image at that time. Continue antibiotics and basal IVF. Ordered serial troponin

## 2017-05-19 ENCOUNTER — Inpatient Hospital Stay (HOSPITAL_COMMUNITY): Payer: Medicaid Other

## 2017-05-19 ENCOUNTER — Ambulatory Visit: Payer: Medicaid Other | Admitting: Cardiology

## 2017-05-19 LAB — TROPONIN I: Troponin I: <0.03 ng/mL (ref <0.03)

## 2017-05-19 LAB — CBC
HCT: 38.9 % (ref 36.0–46.0)
Hemoglobin: 12.6 g/dL (ref 12.0–15.0)
MCH: 32.7 pg (ref 26.0–34.0)
MCHC: 32.4 g/dL (ref 30.0–36.0)
MCV: 101 fL — ABNORMAL HIGH (ref 78.0–100.0)
PLATELETS: 280 10*3/uL (ref 150–400)
RBC: 3.85 MIL/uL — ABNORMAL LOW (ref 3.87–5.11)
RDW: 13.9 % (ref 11.5–15.5)
WBC: 5.4 10*3/uL (ref 4.0–10.5)

## 2017-05-19 LAB — COMPREHENSIVE METABOLIC PANEL
ALBUMIN: 3.1 g/dL — AB (ref 3.5–5.0)
ALK PHOS: 80 U/L (ref 38–126)
ALT: 55 U/L — ABNORMAL HIGH (ref 14–54)
AST: 116 U/L — AB (ref 15–41)
Anion gap: 8 (ref 5–15)
BILIRUBIN TOTAL: 0.9 mg/dL (ref 0.3–1.2)
BUN: 11 mg/dL (ref 6–20)
CALCIUM: 8.3 mg/dL — AB (ref 8.9–10.3)
CO2: 23 mmol/L (ref 22–32)
CREATININE: 0.69 mg/dL (ref 0.44–1.00)
Chloride: 109 mmol/L (ref 101–111)
GFR calc Af Amer: >60 mL/min (ref >60)
GLUCOSE: 106 mg/dL — AB (ref 65–99)
Potassium: 4 mmol/L (ref 3.5–5.1)
Sodium: 140 mmol/L (ref 135–145)
TOTAL PROTEIN: 6.3 g/dL — AB (ref 6.5–8.1)

## 2017-05-19 MED ORDER — GI COCKTAIL ~~LOC~~
30.0000 mL | Freq: Once | ORAL | Status: AC
  Administered 2017-05-19: 30 mL via ORAL
  Filled 2017-05-19: qty 30

## 2017-05-19 MED ORDER — PROMETHAZINE HCL 25 MG/ML IJ SOLN
12.5000 mg | Freq: Four times a day (QID) | INTRAMUSCULAR | Status: DC | PRN
  Administered 2017-05-19 – 2017-05-21 (×5): 12.5 mg via INTRAVENOUS
  Filled 2017-05-19 (×5): qty 1

## 2017-05-19 MED ORDER — CIPROFLOXACIN IN D5W 400 MG/200ML IV SOLN
400.0000 mg | Freq: Two times a day (BID) | INTRAVENOUS | Status: DC
  Administered 2017-05-19 – 2017-05-24 (×10): 400 mg via INTRAVENOUS
  Filled 2017-05-19 (×10): qty 200

## 2017-05-19 NOTE — Progress Notes (Signed)
Nutrition Brief Note  Patient identified on the Malnutrition Screening Tool (MST) Report  Pt's weight fluctuates between 236-253 lb x 1 year. Currently NPO for diverticulitis. Will monitor PO intake once diet is advanced.  Wt Readings from Last 15 Encounters:  05/18/17 226 lb (102.5 kg)  03/08/17 232 lb (105.2 kg)  02/10/17 250 lb (113.4 kg)  02/04/17 242 lb (109.8 kg)  01/26/17 240 lb (108.9 kg)  01/22/17 236 lb (107 kg)  01/16/17 253 lb 15.5 oz (115.2 kg)  11/17/16 239 lb 2 oz (108.5 kg)  10/27/16 240 lb (108.9 kg)  08/26/16 236 lb (107 kg)  08/13/16 236 lb (107 kg)  02/01/16 234 lb (106.1 kg)  01/28/16 234 lb (106.1 kg)  08/22/15 224 lb (101.6 kg)  08/15/15 229 lb 12.8 oz (104.2 kg)    Body mass index is 41.34 kg/m. Patient meets criteria for morbid obesity based on current BMI.   Current diet order is NPO. Labs and medications reviewed.   No nutrition interventions warranted at this time. If nutrition issues arise, please consult RD.   Clayton Bibles, MS, RD, [place] Dietitian Pager: [phone] After Hours Pager: [phone]

## 2017-05-19 NOTE — Progress Notes (Signed)
PROGRESS NOTE    ZAFIRAH VANZEE  QVZ:[id number] DOB: 1955/08/11 DOA: 05/18/2017 PCP: Berkley Harvey, NP    Brief Narrative:  Ann [name] is Robbert Langlinais 61 y.o. 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  Patient initially noted lower quadrant pain that started approximately 1 week prior to this hospital admission.  Symptoms were made worse with food, sometimes relieved with passing flatus.  Patient was prescribed oral Cipro and Flagyl by her PCP for which she started course on 05/15/2017.  Symptoms did not improved, in fact got worse.  Patient subsequently presented to the emergency department for further evaluation.  ED Course: In the emergency department, patient underwent abdominal CT with findings of severe diverticulitis involving the sigmoid colon which spans over 12.5 cm.  No extraluminal drainable abscess or free intraperitoneal air is noted.  She was started on IV Cipro and Flagyl in the emergency department. Of note, patient was noted to have no leukocytosis and noted to be afebrile.  Hospitalist service consulted for consideration for admission.  Assessment & Plan:   Principal Problem:   Diverticulitis large intestine Active Problems:   Essential hypertension   OSA (obstructive sleep apnea)   Chronic respiratory failure with hypoxia (HCC)   Morbid (severe) obesity due to excess calories (HCC)   Symptomatic cholelithiasis  1. Diverticulitis 1. CT with diverticulitis of sigmoid colon, over 12.5 cm.  Possible abscess within wall of inflamed sigmoid colon, but no extraluminal drainable abscess or free intraperitoneal air (rec f/u after acute episode). 2. Will continue patient on IV cipro/flagyl 3. No leukocytosis 4. Keep NPO for  bowel rest with basal IV fluids.  Will try ice chips this AM.  Possibly clears in PM. 5. Monitor for improvement. If no significant improvement, then would consider repeat imaging and possible surgical consultation at that time 6. Given multiple recurrent episodes of diverticulitis (patient recounts at least 4-5 episodes this past year), consider follow-up with general surgery as an outpatient when acute diverticulitis has resolved.  2. Diaphoresis  Malaise  Chest Pain:  Rapid response called for malaise, diaphoresis yesterday.  EKG without concerning ST-T wave changes.  Troponins negative.  She continues to c/o chest pain this morning, but this is reproducible with palpation.  Will order GI cocktail.  Add on lipase.  Continue to monitor.     3. Elevated LFT's:  Elevated AST and ALT.  Will check hepatitis panel.  Also RUQ [place].  Daily CMP.  4. HTN 1. BP suboptimally controlled 2. Cont on scheduled IV bp meds as tolerated (transition to PO once taking PO)  5. Chronic hypoxemic respiratory failure on home O2 1. Cont home o2 as tolerated  6. Hx OSA 1. Appears stable at this time 2. CPAP at home, will continue  7. Morbid obesity 1. Currently stable   DVT prophylaxis: lovenox Code Status: full  Family Communication: none Disposition Plan: pending improvement   Consultants:   none  Procedures: (Don't include imaging studies which can be auto populated. Include things that cannot be auto populated i.e. Echo, Carotid and venous dopplers, Foley, Bipap, HD, tubes/drains, wound vac, central lines etc)  none  Antimicrobials: (specify start and planned stop date. Auto populated tables are space occupying and do not give end dates) Anti-infectives (From admission, onward)  Start     Dose/Rate Route Frequency Ordered Stop   05/19/17 2200  ciprofloxacin (CIPRO) IVPB 400 mg     400 mg 200 mL/hr over 60 Minutes Intravenous Every 12 hours 05/19/17 1155     05/19/17 0000  metroNIDAZOLE  (FLAGYL) IVPB 500 mg     500 mg 100 mL/hr over 60 Minutes Intravenous Every 8 hours 05/18/17 1555     05/18/17 2000  ciprofloxacin (CIPRO) IVPB 400 mg  Status:  Discontinued     400 mg 200 mL/hr over 60 Minutes Intravenous Every 12 hours 05/18/17 1802 05/19/17 1155   05/18/17 1545  ciprofloxacin (CIPRO) IVPB 400 mg  Status:  Discontinued     400 mg 200 mL/hr over 60 Minutes Intravenous Every 12 hours 05/18/17 1540 05/18/17 1802   05/18/17 1545  metroNIDAZOLE (FLAGYL) IVPB 500 mg     500 mg 100 mL/hr over 60 Minutes Intravenous  Once 05/18/17 1540 05/18/17 1728         Subjective: Feels about the same.  Better with pain meds. Feels like labor pains. Has CP, reproduced with my palpation. Wants ice chips, food.  Objective: Vitals:   05/18/17 1854 05/18/17 2030 05/18/17 2117 05/19/17 0444  BP:  126/68  123/65  Pulse: (!) 55 64  (!) 52  Resp:  18 18 18   Temp:  97.7 F (36.5 C)  98 F (36.7 C)  TempSrc:  Oral  Axillary  SpO2:  98%  99%  Weight:      Height:        Intake/Output Summary (Last 24 hours) at 05/19/2017 0913 Last data filed at 05/19/2017 0600 Gross per 24 hour  Intake 1196.25 ml  Output 500 ml  Net 696.25 ml   Filed Weights   05/18/17 1202  Weight: 102.5 kg (226 lb)    Examination:  General exam: Appears calm and comfortable  Respiratory system: Clear to auscultation. Respiratory effort normal. Cardiovascular system: S1 & S2 heard, RRR. No JVD, murmurs, rubs, gallops or clicks. No pedal edema.  CP reproduces with palpation. Gastrointestinal system: Soft bowel sounds, mild TTP along lower quadrants.  Central nervous system: Alert and oriented. No focal neurological deficits. Extremities: Symmetric 5 x 5 power. Skin: No rashes, lesions or ulcers Psychiatry: Judgement and insight appear normal. Mood & affect appropriate.     Data Reviewed: I have personally reviewed following labs and imaging studies  CBC: Recent Labs  Lab 05/18/17 1230  05/19/17 0109  WBC 6.6 5.4  HGB 13.5 12.6  HCT 40.7 38.9  MCV 100.0 101.0*  PLT 330 638   Basic Metabolic Panel: Recent Labs  Lab 05/18/17 1230 05/19/17 0109  NA 141 140  K 4.4 4.0  CL 107 109  CO2 28 23  GLUCOSE 97 106*  BUN 14 11  CREATININE 0.96 0.69  CALCIUM 8.7* 8.3*   GFR: Estimated Creatinine Clearance: 86 mL/min (by C-G formula based on SCr of 0.69 mg/dL). Liver Function Tests: Recent Labs  Lab 05/18/17 1230 05/19/17 0109  AST 31 116*  ALT 26 55*  ALKPHOS 77 80  BILITOT 1.2 0.9  PROT 7.4 6.3*  ALBUMIN 3.4* 3.1*   Recent Labs  Lab 05/18/17 1230  LIPASE 22   No results for input(s): AMMONIA in the last 168 hours. Coagulation Profile: No results for input(s): INR, PROTIME in the last 168 hours. Cardiac Enzymes: Recent Labs  Lab 05/18/17 1853 05/19/17 0109 05/19/17 0626  TROPONINI <0.03 <0.03 <0.03   BNP (last 3 results) No results  for input(s): PROBNP in the last 8760 hours. HbA1C: No results for input(s): HGBA1C in the last 72 hours. CBG: No results for input(s): GLUCAP in the last 168 hours. Lipid Profile: No results for input(s): CHOL, HDL, LDLCALC, TRIG, CHOLHDL, LDLDIRECT in the last 72 hours. Thyroid Function Tests: No results for input(s): TSH, T4TOTAL, FREET4, T3FREE, THYROIDAB in the last 72 hours. Anemia Panel: No results for input(s): VITAMINB12, FOLATE, FERRITIN, TIBC, IRON, RETICCTPCT in the last 72 hours. Sepsis Labs: No results for input(s): PROCALCITON, LATICACIDVEN in the last 168 hours.  No results found for this or any previous visit (from the past 240 hour(s)).       Radiology Studies: Ct Abdomen Pelvis W Contrast  Result Date: 05/18/2017 CLINICAL DATA:  61 year old female with bilateral lower abdominal pain for 2 weeks. History of diverticulitis. Recently treated with Flagyl and Cipro. Pain is worse. Initial encounter. EXAM: CT ABDOMEN AND PELVIS WITH CONTRAST TECHNIQUE: Multidetector CT imaging of the abdomen and  pelvis was performed using the standard protocol following bolus administration of intravenous contrast. CONTRAST:  175mL ISOVUE-300 IOPAMIDOL (ISOVUE-300) INJECTION 61% COMPARISON:  03/08/2017, 12/02/2016, 11/19/2015 and 04/20/2014 CT. FINDINGS: Lower chest: Minimal basilar scarring. Heart top-normal slightly enlarged. Small hiatal hernia. Hepatobiliary: Top-normal size liver. No worrisome hepatic lesion. Post cholecystectomy. No calcified common bile duct stone. Pancreas: No pancreatic mass or inflammation. Spleen: No splenic mass or enlargement. Adrenals/Urinary Tract: Slightly atrophic right kidney with chronic pelvis/ caliceal prominence which may reflect result of prior obstruction as noted on prior exams. No left renal collecting system obstruction. Minimally lobulated contour with tiny hyperdense lesion inferior aspect left kidney unchanged from 2015 exam. No adrenal mass. Contracted noncontrast filled urinary bladder unremarkable. Stomach/Bowel: Marked diverticulitis sigmoid colon spanning over 12.5 cm. Although there may be an abscess within the wall of inflamed sigmoid colon, no extraluminal drainable abscess or free intraperitoneal air is noted. This segment of bowel can be assessed on follow-up after acute episode has cleared. Inflamed sigmoid colon contacts the left adnexae and posterosuperior aspect of the uterus. Small hiatal hernia.  No inflammation surrounds the appendix. Vascular/Lymphatic: Mild calcification lower abdominal aorta. No aortic aneurysm or large vessel occlusion. Increase number of pelvic lymph nodes greater on the left and lower abdominal aortic region felt to be reactive in origin. Reproductive: No primary adnexal abnormality or uterine abnormality. Other: Mild diastases rectus muscles. Musculoskeletal: Degenerative changes lumbar spine. IMPRESSION: Marked diverticulitis sigmoid colon spanning over 12.5 cm. Although there may be an abscess within the wall of inflamed sigmoid colon,  no extraluminal drainable abscess or free intraperitoneal air is noted. This segment of bowel can be assessed on follow-up after acute episode has cleared. Inflamed sigmoid colon contacts the left adnexae and posterosuperior aspect of the uterus. Pelvic lymph nodes greater on the left felt to be reactive in origin. Small hiatal hernia. No obstructing renal/ureteral calculi. Electronically Signed   By: Genia Del M.D.   On: 05/18/2017 15:09        Scheduled Meds: . enoxaparin (LOVENOX) injection  50 mg Subcutaneous Q24H  . gi cocktail  30 mL Oral Once  . metoprolol tartrate  2.5 mg Intravenous Q6H   Continuous Infusions: . ciprofloxacin Stopped (05/18/17 2100)  . dextrose 5 % and 0.9% NaCl 75 mL/hr at 05/18/17 1803  . metronidazole 500 mg (05/19/17 0819)     LOS: 1 day    Time spent: over 7 min    Fayrene Helper, MD [facility] Pager [phone]   If 7PM-7AM,  please contact night-coverage [email or web address] Password [facility] 05/19/2017, 9:13 AM

## 2017-05-20 DIAGNOSIS — R935 Abnormal findings on diagnostic imaging of other abdominal regions, including retroperitoneum: Secondary | ICD-10-CM

## 2017-05-20 DIAGNOSIS — R945 Abnormal results of liver function studies: Secondary | ICD-10-CM

## 2017-05-20 DIAGNOSIS — K5732 Diverticulitis of large intestine without perforation or abscess without bleeding: Principal | ICD-10-CM

## 2017-05-20 LAB — CBC
HCT: 36.6 % (ref 36.0–46.0)
Hemoglobin: 11.5 g/dL — ABNORMAL LOW (ref 12.0–15.0)
MCH: 32.2 pg (ref 26.0–34.0)
MCHC: 31.4 g/dL (ref 30.0–36.0)
MCV: 102.5 fL — AB (ref 78.0–100.0)
PLATELETS: 250 10*3/uL (ref 150–400)
RBC: 3.57 MIL/uL — AB (ref 3.87–5.11)
RDW: 14 % (ref 11.5–15.5)
WBC: 4.6 10*3/uL (ref 4.0–10.5)

## 2017-05-20 LAB — COMPREHENSIVE METABOLIC PANEL
ALT: 42 U/L (ref 14–54)
ANION GAP: 4 — AB (ref 5–15)
AST: 34 U/L (ref 15–41)
Albumin: 2.8 g/dL — ABNORMAL LOW (ref 3.5–5.0)
Alkaline Phosphatase: 70 U/L (ref 38–126)
BUN: 9 mg/dL (ref 6–20)
CALCIUM: 8.2 mg/dL — AB (ref 8.9–10.3)
CHLORIDE: 110 mmol/L (ref 101–111)
CO2: 27 mmol/L (ref 22–32)
CREATININE: 0.81 mg/dL (ref 0.44–1.00)
Glucose, Bld: 95 mg/dL (ref 65–99)
Potassium: 3.8 mmol/L (ref 3.5–5.1)
SODIUM: 141 mmol/L (ref 135–145)
Total Bilirubin: 0.6 mg/dL (ref 0.3–1.2)
Total Protein: 5.7 g/dL — ABNORMAL LOW (ref 6.5–8.1)

## 2017-05-20 LAB — HEPATITIS PANEL, ACUTE
HEP A IGM: NEGATIVE
HEP B S AG: NEGATIVE
Hep B C IgM: NEGATIVE

## 2017-05-20 MED ORDER — CLONAZEPAM 0.5 MG PO TABS: 0.5000 mg | ORAL_TABLET | Freq: Two times a day (BID) | ORAL | Status: DC | PRN

## 2017-05-20 MED ORDER — TOPIRAMATE 25 MG PO TABS
50.0000 mg | ORAL_TABLET | Freq: Two times a day (BID) | ORAL | Status: DC
  Administered 2017-05-20 – 2017-05-21 (×4): 50 mg via ORAL
  Administered 2017-05-22: 25 mg via ORAL
  Administered 2017-05-22 – 2017-05-27 (×11): 50 mg via ORAL
  Filled 2017-05-20 (×16): qty 2

## 2017-05-20 MED ORDER — MOMETASONE FURO-FORMOTEROL FUM 200-5 MCG/ACT IN AERO
2.0000 | INHALATION_SPRAY | Freq: Two times a day (BID) | RESPIRATORY_TRACT | Status: DC
  Administered 2017-05-20 – 2017-05-21 (×2): 2 via RESPIRATORY_TRACT
  Filled 2017-05-20: qty 8.8

## 2017-05-20 MED ORDER — FLUTICASONE PROPIONATE 50 MCG/ACT NA SUSP
2.0000 | Freq: Every day | NASAL | Status: DC
  Administered 2017-05-20 – 2017-05-23 (×3): 2 via NASAL
  Filled 2017-05-20: qty 16

## 2017-05-20 MED ORDER — METOPROLOL TARTRATE 5 MG/5ML IV SOLN: 2.5000 mg | Freq: Four times a day (QID) | INTRAVENOUS | Status: DC

## 2017-05-20 MED ORDER — AMLODIPINE BESYLATE 10 MG PO TABS
10.0000 mg | ORAL_TABLET | Freq: Every day | ORAL | Status: DC
  Administered 2017-05-22: 10 mg via ORAL
  Filled 2017-05-20 (×2): qty 1

## 2017-05-20 MED ORDER — MORPHINE SULFATE (PF) 2 MG/ML IV SOLN
2.0000 mg | INTRAVENOUS | Status: DC | PRN
  Administered 2017-05-20: 2 mg via INTRAVENOUS
  Administered 2017-05-21: 4 mg via INTRAVENOUS
  Filled 2017-05-20: qty 1
  Filled 2017-05-20: qty 2

## 2017-05-20 MED ORDER — OXYBUTYNIN CHLORIDE ER 5 MG PO TB24
5.0000 mg | ORAL_TABLET | Freq: Every day | ORAL | Status: DC
  Administered 2017-05-20 – 2017-05-27 (×8): 5 mg via ORAL
  Filled 2017-05-20 (×8): qty 1

## 2017-05-20 MED ORDER — METOPROLOL TARTRATE 5 MG/5ML IV SOLN: 5.0000 mg | Freq: Four times a day (QID) | INTRAVENOUS | Status: DC

## 2017-05-20 NOTE — Consult Note (Signed)
Referring Provider:  Dr. Grandville Silos, [facility] Primary Care Physician:  Berkley Harvey, NP Primary Gastroenterologist:  Dr. Havery Moros  Reason for Consultation:  Diverticulitis  HPI: Ann [name] is a 61 y.o. 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  Patient initially noted lower quadrant pain that started approximately 1 week prior to this hospital admission.  Symptoms were made worse with food, sometimes relieved with passing flatus.  Her symptoms became much worse on 11/9 at which time she began a course of Cipro and Flagyl that was prescribed by her PCP.  Symptoms did not improved, in fact got worse.  Patient subsequently presented to the emergency department for further evaluation.  CT scan abdomen and pelvis with contrast showed the following:  IMPRESSION: Marked diverticulitis sigmoid colon spanning over 12.5 cm. Although there may be an abscess within the wall of inflamed sigmoid colon, no extraluminal drainable abscess or free intraperitoneal air is noted. This segment of bowel can be assessed on follow-up after acute episode has cleared.  Inflamed sigmoid colon contacts the left adnexae and posterosuperior aspect of the uterus.  Pelvic lymph nodes greater on the left felt to be reactive in origin.  Small hiatal hernia.  No obstructing renal/ureteral calculi.  She was started on IV cipro and flagyl.  Continues to have pain.  Minimal improvement, still requiring pain medications, which do help.  Is asking if she can have clear liquids and asking when we think that she will be able to go home.   Colonoscopy 08/2016 by Dr. Havery Moros showed the following:  Findings: - The perianal and digital rectal examinations were  normal. - A 4 mm polyp was found in the cecum. The polyp was sessile. The polyp was removed with a cold snare. Resection and retrieval were complete. - Scattered medium-mouthed diverticula were found in the transverse colon and left colon. - There was significant spasm in the transverse colon and in the ascending colon. The rectosigmoid and sigmoid was extremely redundant and difficult to insufflate. - Internal hemorrhoids were found. The hemorrhoids were large. - The exam was otherwise without abnormality.   Past Medical History:  Diagnosis Date  . Adenoma of colon   . Allergy    seasonal  . Anemia    with past pregnancy -not recent  . Anxiety   . Arthritis   . Borderline diabetes   . Chronic cystitis   . Complication of anesthesia    02-20-2014 ([facility]) INTRA-OP RESPIRATORY FAILURE SECONDARY TO POSSIBLE MUCOUS ASPIRATION/  ALSO 06-06-2013 [facility] ([place] POST-OP DESATURATION  EVEN USING CPAP, States some issues if Propofol given to rapidly.  . Diverticulitis   . Diverticulosis of colon   . Dyspnea   . Family history of adverse reaction to anesthesia    PER PT SISTER DIED AFTER GENERAL ANESTHESIA DUE TO UNDIAGNOSED OSA  . GERD (gastroesophageal reflux disease)   . H/O hiatal hernia   . Headache(784.0)    migraines, decreased since turning 50's  . History of chronic cough    "tight sounding cough"  . History of esophageal dilatation   . History of kidney stones   . History of MRSA infection    3/ 2015  AXILLARY ABSCESS  . History of recurrent UTIs   . History of TIAs NO RESIDUAL  1980;  2005;   2008 PT STATES PER CT  SCARRING RIGHT SIDE OF BRAIN  . Hyperlipidemia   . Hypertension   . Kidney disease    III  . Neuromuscular disorder ([place])    HH  . Neuropathy    pt denies this 07-05-2015  . Nocturia   . OSA on CPAP    PER SLEEP STUDY 09-08-2012  MODERATE OSA. not used in awhile, but thinks needs to start back using due to some weight gain.  . Sleep apnea   . Stroke [facility])  [phone]   TIA'S  . SUI (stress urinary incontinence, female)     Past Surgical History:  Procedure Laterality Date  . ESOPHAGEAL DILATION  X2  LAST ONE  JUNE 2014   has had 9 of these  . KNEE ARTHROSCOPY Bilateral 2008  . LAPAROSCOPIC CHOLECYSTECTOMY  02/10/2017  . MULTIPLE TOOTH EXTRACTIONS     past hx  . TUBAL LIGATION  1987    Prior to Admission medications   Medication Sig Start Date End Date Taking? Authorizing Provider  albuterol (PROAIR HFA) 108 (90 Base) MCG/ACT inhaler Inhale 2 puffs into the lungs every 4 (four) hours as needed. For cough wheezing. 08/07/15  Yes [provider]  albuterol (PROVENTIL) (2.5 MG/3ML) 0.083% nebulizer solution Inhale 3 mLs into the lungs every 6 (six) hours as needed. Wheezing/shortness of breath 08/07/15  Yes [provider]  amLODipine (NORVASC) 10 MG tablet Take 10 mg by mouth daily.    Yes [provider]  aspirin EC 81 MG tablet Take 81 mg daily by mouth.   Yes [provider]  atorvastatin (LIPITOR) 10 MG tablet Take 10 mg daily by mouth. 05/15/17  Yes [provider]  budesonide-formoterol (SYMBICORT) 160-4.5 MCG/ACT inhaler Inhale 2 puffs 2 (two) times daily into the lungs. 04/21/17 04/21/18 Yes [provider]  chlorpheniramine (CHLOR-TRIMETON) 4 MG tablet Take 1 tablet (4 mg total) by mouth at bedtime. 03/08/15  Yes Chesley Mires, MD  cholestyramine (QUESTRAN) 4 g packet Take 1 packet 4 (four) times daily by mouth. 03/24/17  Yes [provider]  ciprofloxacin (CIPRO) 500 MG tablet Take 500 mg 2 (two) times daily by mouth. For 7 days. 05/15/17  Yes [provider]  clonazePAM (KLONOPIN) 0.5 MG tablet Take 0.5 mg by mouth 2 (two) times daily as needed for anxiety.    Yes [provider]  fluticasone (FLONASE) 50 MCG/ACT nasal spray Place 2 sprays daily into both nostrils. 08/07/15  Yes [provider]  furosemide (LASIX) 40 MG tablet Take 40 mg daily by  mouth. 03/06/15  Yes [provider]  losartan (COZAAR) 50 MG tablet Take 1 tablet (50 mg total) by mouth daily. 05/05/17 08/03/17 Yes Burtis Junes, NP  metoprolol succinate (TOPROL-XL) 50 MG 24 hr tablet Take 50 mg by mouth at bedtime.  08/08/15  Yes [provider]  metroNIDAZOLE (FLAGYL) 250 MG tablet Take 250 mg 3 (three) times daily by mouth. 05/15/17  Yes [provider]  omeprazole (PRILOSEC) 40 MG capsule TAKE 1 CAPSULE(40 MG) BY MOUTH TWICE DAILY 09/17/16  Yes Armbruster, [name], MD  OXYGEN Inhale into the lungs.   Yes [provider]  polyethylene glycol (MIRALAX / GLYCOLAX) packet Take 17 g by mouth daily as needed for moderate constipation. 01/16/17  Yes Bonnielee Haff, MD  promethazine (PHENERGAN) 12.5 MG tablet Take 1 tablet (12.5 mg total) by mouth every 6 (six) hours as needed for nausea or vomiting. 02/11/17  Yes Coralie Keens, MD  topiramate (TOPAMAX) 50 MG tablet Take 50 mg by mouth 2 (two) times daily.   Yes [provider]  UNABLE TO FIND Med Name: CPAP   Yes [provider]  ondansetron (ZOFRAN ODT) 8 MG disintegrating tablet Take 1 tablet (8 mg total) by mouth every 8 (eight) hours as needed for nausea or vomiting. Patient not taking: Reported on 05/18/2017 03/08/17   Lajean Saver, MD  oxybutynin (DITROPAN XL) 5 MG 24 hr tablet Take 1 tablet (5 mg total) by mouth at bedtime. Patient not taking: Reported on 05/18/2017 03/20/17   Delia Heady, PA-C  oxyCODONE (ROXICODONE) 5 MG immediate release tablet Take 1 tablet (5 mg total) by mouth every 4 (four) hours as needed for severe pain. Patient not taking: Reported on 05/18/2017 03/20/17   Delia Heady, PA-C  oxyCODONE-acetaminophen (PERCOCET/ROXICET) 5-325 MG tablet Take 1-2 tablets by mouth every 6 (six) hours as needed for severe pain. Patient not taking: Reported on 05/18/2017 03/08/17   Lajean Saver, MD  tamsulosin (FLOMAX) 0.4 MG CAPS capsule Take 1 capsule (0.4 mg total)  by mouth daily after supper. Patient not taking: Reported on 05/18/2017 03/20/17   Delia Heady, PA-C    Current Facility-Administered Medications  Medication Dose Route Frequency Provider Last Rate Last Dose  . acetaminophen (TYLENOL) tablet 650 mg  650 mg Oral Q6H PRN Donne Hazel, MD       Or  . acetaminophen (TYLENOL) suppository 650 mg  650 mg Rectal Q6H PRN Donne Hazel, MD      . ciprofloxacin (CIPRO) IVPB 400 mg  400 mg Intravenous Q12H Minda Ditto, RPH   Stopped at 05/20/17 1122  . dextrose 5 %-0.9 % sodium chloride infusion   Intravenous Continuous Donne Hazel, MD 75 mL/hr at 05/20/17 0436 1,000 mL at 05/20/17 0436  . enoxaparin (LOVENOX) injection 50 mg  50 mg Subcutaneous Q24H Donne Hazel, MD   50 mg at 05/19/17 2139  . fluticasone (FLONASE) 50 MCG/ACT nasal spray 2 spray  2 spray Each Nare Daily Eugenie Filler, MD      . hydrALAZINE (APRESOLINE) injection 5 mg  5 mg Intravenous Q4H PRN Donne Hazel, MD      . ketorolac (TORADOL) 30 MG/ML injection 30 mg  30 mg Intravenous Q6H PRN Donne Hazel, MD   30 mg at 05/20/17 3151  . LORazepam (ATIVAN) injection 0.5 mg  0.5 mg Intravenous Q6H PRN Donne Hazel, MD      . metoprolol tartrate (LOPRESSOR) injection 2.5 mg  2.5 mg Intravenous Q6H Eugenie Filler, MD      . metroNIDAZOLE (FLAGYL) IVPB 500 mg  500 mg Intravenous Q8H Donne Hazel, MD   Stopped at 05/20/17 [phone]  . mometasone-formoterol (DULERA) 200-5 MCG/ACT inhaler 2 puff  2 puff Inhalation BID Eugenie Filler, MD      . morphine 2 MG/ML injection 2-4 mg  2-4 mg Intravenous Q4H PRN Eugenie Filler, MD      . ondansetron [facility]) tablet 4 mg  4 mg Oral Q6H PRN Donne Hazel, MD       Or  . ondansetron [facility]) injection 4 mg  4 mg Intravenous Q6H PRN Donne Hazel, MD   4 mg at 05/19/17 1426  . oxybutynin (DITROPAN-XL) 24 hr tablet 5 mg  5 mg Oral QHS Eugenie Filler, MD      . promethazine (PHENERGAN) injection 12.5 mg  12.5 mg  Intravenous Q6H PRN Elodia Florence., MD   12.5 mg at 05/20/17 8938  . topiramate (TOPAMAX) tablet 50 mg  50 mg Oral BID Eugenie Filler, MD        Allergies as of 05/18/2017 - Review Complete 05/18/2017  Allergen Reaction Noted  . Lisinopril Cough 05/18/2017  . Doxycycline Diarrhea 08/13/2016  . Metronidazole Other (See Comments) 02/09/2015    Family History  Problem Relation Age of Onset  . Colon cancer Father 23       died at 22  . Heart disease Mother        died at 50  . Heart attack Mother   . Heart attack Sister        died at 90  . Hypertension Brother   . Heart attack Cousin 63       with death  . Heart attack Cousin 56       died with MI  . Esophageal cancer Neg Hx   . Rectal cancer Neg Hx   . Stomach cancer Neg Hx   . Colon polyps Neg Hx     Social History   Socioeconomic History  . Marital status: Married    Spouse name: Herbie Baltimore  . Number of children: 3  . Years of education: 40  . Highest education level: Not on file  Social Needs  . Financial resource strain: Not on file  . Food insecurity - worry: Not on file  . Food insecurity - inability: Not on file  . Transportation needs - medical: Not on file  . Transportation needs - non-medical: Not on file  Occupational History  . Occupation: Press photographer  Tobacco Use  . Smoking status: Never Smoker  . Smokeless tobacco: Never Used  Substance and Sexual Activity  . Alcohol use: No    Alcohol/week: 0.0 oz  . Drug use: No  . Sexual activity: No    Birth control/protection: Post-menopausal  Other Topics Concern  . Not on file  Social History Narrative   Patient lives at home with her husband 38 years. Herbie [place]). Patient has a 12th grade education.    Right handed.    Caffeine - two daily.    Review of Systems: ROS is O/W negative except as mentioned in HPI.  Physical Exam: Vital signs in last 24 hours: Temp:  [97.7 F (36.5 C)-98.1 F (36.7 C)] 98 F (36.7 C) (11/14 1314) Pulse Rate:   [52-61] 61 (11/14 1314) Resp:  [18] 18 (11/14 1314) BP: (137-151)/(61-94) 151/73 (11/14 1314) SpO2:  [100 %] 100 % (11/14 1314) Last BM Date: 05/18/17 General:  Alert, Well-developed, well-nourished, pleasant and cooperative in NAD Head:  Normocephalic and atraumatic. Eyes:  Sclera clear, no icterus.  Conjunctiva pink. Ears:  Normal auditory acuity. Mouth:  No deformity or lesions.   Neck:  Supple; no masses or thyromegaly. Lungs:  Clear throughout to auscultation.  No wheezes, crackles, or rhonchi.  Heart:  Regular rate and rhythm; no murmurs, clicks, rubs, or gallops. Abdomen:  Soft, non-distended.  BS present.  Mild to moderate TTP in LLQ. Msk:  Symmetrical without gross deformities. Pulses:  Normal pulses noted. Extremities:  Without clubbing or edema. Neurologic:  Alert and oriented x 4;  grossly normal neurologically. Skin:  Intact without significant lesions or rashes. Psych:  Alert and cooperative. Normal mood and affect.  Intake/Output from previous day: 11/13 0701 - 11/14 0700 In: 1996.3 [I.V.:1496.3; IV Piggyback:500] Out: 1550 [Urine:1550] Intake/Output this shift: Total I/O In: 0  Out:  400 [Urine:400]  Lab Results: Recent Labs    05/18/17 1230 05/19/17 0109 05/20/17 0530  WBC 6.6 5.4 4.6  HGB 13.5 12.6 11.5*  HCT 40.7 38.9 36.6  PLT 330 280 250   BMET Recent Labs    05/18/17 1230 05/19/17 0109 05/20/17 0530  NA 141 140 141  K 4.4 4.0 3.8  CL 107 109 110  CO2 28 23 27   GLUCOSE 97 106* 95  BUN 14 11 9   CREATININE 0.96 0.69 0.81  CALCIUM 8.7* 8.3* 8.2*   LFT Recent Labs    05/20/17 0530  PROT 5.7*  ALBUMIN 2.8*  AST 34  ALT 42  ALKPHOS 70  BILITOT 0.6   Hepatitis Panel Recent Labs    05/19/17 1034  HEPBSAG Negative  HCVAB <0.1  HEPAIGM Negative  HEPBIGM Negative    Studies/Results: Ct Abdomen Pelvis W Contrast  Result Date: 05/18/2017 CLINICAL DATA:  61 year old female with bilateral lower abdominal pain for 2 weeks. History  of diverticulitis. Recently treated with Flagyl and Cipro. Pain is worse. Initial encounter. EXAM: CT ABDOMEN AND PELVIS WITH CONTRAST TECHNIQUE: Multidetector CT imaging of the abdomen and pelvis was performed using the standard protocol following bolus administration of intravenous contrast. CONTRAST:  156mL ISOVUE-300 IOPAMIDOL (ISOVUE-300) INJECTION 61% COMPARISON:  03/08/2017, 12/02/2016, 11/19/2015 and 04/20/2014 CT. FINDINGS: Lower chest: Minimal basilar scarring. Heart top-normal slightly enlarged. Small hiatal hernia. Hepatobiliary: Top-normal size liver. No worrisome hepatic lesion. Post cholecystectomy. No calcified common bile duct stone. Pancreas: No pancreatic mass or inflammation. Spleen: No splenic mass or enlargement. Adrenals/Urinary Tract: Slightly atrophic right kidney with chronic pelvis/ caliceal prominence which may reflect result of prior obstruction as noted on prior exams. No left renal collecting system obstruction. Minimally lobulated contour with tiny hyperdense lesion inferior aspect left kidney unchanged from 2015 exam. No adrenal mass. Contracted noncontrast filled urinary bladder unremarkable. Stomach/Bowel: Marked diverticulitis sigmoid colon spanning over 12.5 cm. Although there may be an abscess within the wall of inflamed sigmoid colon, no extraluminal drainable abscess or free intraperitoneal air is noted. This segment of bowel can be assessed on follow-up after acute episode has cleared. Inflamed sigmoid colon contacts the left adnexae and posterosuperior aspect of the uterus. Small hiatal hernia.  No inflammation surrounds the appendix. Vascular/Lymphatic: Mild calcification lower abdominal aorta. No aortic aneurysm or large vessel occlusion. Increase number of pelvic lymph nodes greater on the left and lower abdominal aortic region felt to be reactive in origin. Reproductive: No primary adnexal abnormality or uterine abnormality. Other: Mild diastases rectus muscles.  Musculoskeletal: Degenerative changes lumbar spine. IMPRESSION: Marked diverticulitis sigmoid colon spanning over 12.5 cm. Although there may be an abscess within the wall of inflamed sigmoid colon, no extraluminal drainable abscess or free intraperitoneal air is noted. This segment of bowel can be assessed on follow-up after acute episode has cleared. Inflamed sigmoid colon contacts the left adnexae and posterosuperior aspect of the uterus. Pelvic lymph nodes greater on the left felt to be reactive in origin. Small hiatal hernia. No obstructing renal/ureteral calculi. Electronically Signed   By: Genia Del M.D.   On: 05/18/2017 15:09   US Abdomen Limited  Result Date: 05/19/2017 CLINICAL DATA:  Elevated LFTs EXAM: ULTRASOUND ABDOMEN LIMITED RIGHT UPPER QUADRANT COMPARISON:  05/18/2017 FINDINGS: Gallbladder: Status post cholecystectomy Common bile duct: Diameter: 3 mm. Liver: No focal lesion identified. Within normal limits in parenchymal echogenicity. Portal vein is patent on color Doppler imaging with normal direction of blood flow towards the liver. IMPRESSION: Status post  cholecystectomy. No acute abnormality noted. Electronically Signed   By: Inez Catalina M.D.   On: 05/19/2017 13:50    IMPRESSION:  *61 year old female with recurrent sigmoid diverticulitis.  Now with a 12.5 cm segment affected in the sigmoid colon.   PLAN: *Continue IV cipro and flagyl for now, but could consider changing to Zosyn if she fails to improve soon. *Ok for clear liquids. *Repeat CT scan and surgical consult if does not improve or worsens. *Will likely need surgical resection of this area in the future to prevent recurrence.  Hopefully she can avoid surgery during this acute episode.   [name]. Zehr  05/20/2017, 2:21 PM  Pager number [phone]  GI ATTENDING  History, I would towards, x-rays, prior endoscopy and colonoscopy reports reviewed. Patient personally seen and examined. Agree with above  comprehensive consultation note as outlined. Patient with multiple recurrent bouts of diverticulitis all treated as outpatient. Now admitted with refractory abdominal pain. CT scan showing significant but uncomplicated diverticulitis involving the sigmoid colon. I have reviewed the CT scan with Dr. Clovis Riley. All of the mesenteric vessels are widely patent. Examination is not overwhelming. Normal at blood cell count. No fevers. She wants to eat. We will allow liquids. Continue IV antibiotics. Advance diet as tolerated. Pain control as needed. For persistent symptoms consider repeat imaging and possible surgical evaluation. Thank you  Docia Chuck. Geri Seminole., M.D. [facility] Division of Gastroenterology

## 2017-05-20 NOTE — Progress Notes (Signed)
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 Patient initially noted lower quadrant pain that started approximately 1 week prior to this hospital admission. Symptoms were made worse with food, sometimes relieved with passing flatus. Patient was prescribed oral Cipro and Flagyl by her PCP for which she started course on 05/15/2017. Symptoms did not improved, in fact got worse. Patient subsequently presented to the emergency department for further evaluation.  ED Course:In the emergency department, patient underwent abdominal CT with findings of severe diverticulitis involving the sigmoid colon which spans over 12.5 cm. No extraluminal drainable abscess or free intraperitoneal air is noted. She was started on IV Cipro and Flagyl in the emergency department. Of note, patient was noted to have no leukocytosis and noted to be afebrile. Hospitalist service consulted for consideration for admission.     Assessment & Plan:   Principal Problem:   Diverticulitis large intestine Active Problems:   Essential hypertension   OSA (obstructive sleep apnea)   Chronic respiratory failure with hypoxia (HCC)   Morbid (severe) obesity due to excess calories (HCC)   Symptomatic cholelithiasis  #1 acute severe diverticulitis Patient presented with lower abdominal pain CT abdomen and pelvis showing acute severe diverticulitis of the sigmoid colon over 12.5 cm with possible abscess in the wall of inflamed sigmoid colon but no extraluminal drainable abscess noted.  Patient currently afebrile.  Normal  white count.  Patient still with complaints of significant lower abdominal pain.  Tolerating ice.  Asking for food.  Continue empiric IV ciprofloxacin and IV Flagyl.  Patient with multiple episodes of acute diverticulitis this year approximately 4-5 episodes.  Due to significant extensive diverticulitis will consult with GI for further evaluation and management.  Continue IV fluids.  Continue supportive care.  2.  Hypertension Continue current dose of IV metoprolol.  Will resume home dose oral Norvasc.  Follow.  3.  Chronic hypoxemic respiratory failure on home O2 Continue O2.  Follow.  4.  History of obstructive sleep apnea CPAP nightly.  5.  Morbid obesity  6.  Transaminitis Improved.  Acute hepatitis panel negative. follow.   DVT prophylaxis: Lovenox Code Status: Full Family Communication: Updated patient.  No family at bedside. Disposition Plan: Home once acute flare has improved and patient tolerating oral intake.   Consultants:   Gastroenterology pending  Procedures:   CT abdomen and pelvis 05/18/2017  Antimicrobials:   IV ciprofloxacin 05/18/2017  IV Flagyl 05/18/2017   Subjective: Patient complaining of headache.  Patient also complaining of lower suprapubic abdominal pain.  No nausea or emesis.  Patient asking to eat.  Objective: Vitals:   05/19/17 1752 05/19/17 2240 05/20/17 0600 05/20/17 1314  BP: (!) 137/94 (!) 141/74 139/61 (!) 151/73  Pulse: (!) 55 (!) 52 (!) 54 61  Resp:  18 18 18   Temp:  98.1 F (36.7 C) 97.7 F (36.5 C) 98 F (36.7 C)  TempSrc:  Oral Oral Oral  SpO2:  100% 100% 100%  Weight:      Height:        Intake/Output Summary (Last 24 hours) at 05/20/2017 1326 Last data filed at 05/20/2017 1315 Gross  per 24 hour  Intake 1696.25 ml  Output 1800 ml  Net -103.75 ml   Filed Weights   05/18/17 1202  Weight: 102.5 kg (226 lb)    Examination:  General exam: Appears calm and comfortable  Respiratory system: Clear to auscultation.  Respiratory effort normal. Cardiovascular system: S1 & S2 heard, RRR. No JVD, murmurs, rubs, gallops or clicks. No pedal edema. Gastrointestinal system: Abdomen is nondistended, soft and tender to palpation in the suprapubic region.  No organomegaly or masses felt. Normal bowel sounds heard. Central nervous system: Alert and oriented. No focal neurological deficits. Extremities: Symmetric 5 x 5 power. Skin: No rashes, lesions or ulcers Psychiatry: Judgement and insight appear normal. Mood & affect appropriate.     Data Reviewed: I have personally reviewed following labs and imaging studies  CBC: Recent Labs  Lab 05/18/17 1230 05/19/17 0109 05/20/17 0530  WBC 6.6 5.4 4.6  HGB 13.5 12.6 11.5*  HCT 40.7 38.9 36.6  MCV 100.0 101.0* 102.5*  PLT 330 280 170   Basic Metabolic Panel: Recent Labs  Lab 05/18/17 1230 05/19/17 0109 05/20/17 0530  NA 141 140 141  K 4.4 4.0 3.8  CL 107 109 110  CO2 28 23 27   GLUCOSE 97 106* 95  BUN 14 11 9   CREATININE 0.96 0.69 0.81  CALCIUM 8.7* 8.3* 8.2*   GFR: Estimated Creatinine Clearance: 85 mL/min (by C-G formula based on SCr of 0.81 mg/dL). Liver Function Tests: Recent Labs  Lab 05/18/17 1230 05/19/17 0109 05/20/17 0530  AST 31 116* 34  ALT 26 55* 42  ALKPHOS 77 80 70  BILITOT 1.2 0.9 0.6  PROT 7.4 6.3* 5.7*  ALBUMIN 3.4* 3.1* 2.8*   Recent Labs  Lab 05/18/17 1230  LIPASE 22   No results for input(s): AMMONIA in the last 168 hours. Coagulation Profile: No results for input(s): INR, PROTIME in the last 168 hours. Cardiac Enzymes: Recent Labs  Lab 05/18/17 1853 05/19/17 0109 05/19/17 0626  TROPONINI <0.03 <0.03 <0.03   BNP (last 3 results) No results for input(s): PROBNP in the last 8760 hours. HbA1C: No results for input(s): HGBA1C in the last 72 hours. CBG: No results for input(s): GLUCAP in the last 168 hours. Lipid Profile: No results for input(s): CHOL, HDL, LDLCALC, TRIG, CHOLHDL, LDLDIRECT in the last 72  hours. Thyroid Function Tests: No results for input(s): TSH, T4TOTAL, FREET4, T3FREE, THYROIDAB in the last 72 hours. Anemia Panel: No results for input(s): VITAMINB12, FOLATE, FERRITIN, TIBC, IRON, RETICCTPCT in the last 72 hours. Sepsis Labs: No results for input(s): PROCALCITON, LATICACIDVEN in the last 168 hours.  No results found for this or any previous visit (from the past 240 hour(s)).       Radiology Studies: Ct Abdomen Pelvis W Contrast  Result Date: 05/18/2017 CLINICAL DATA:  61 year old female with bilateral lower abdominal pain for 2 weeks. History of diverticulitis. Recently treated with Flagyl and Cipro. Pain is worse. Initial encounter. EXAM: CT ABDOMEN AND PELVIS WITH CONTRAST TECHNIQUE: Multidetector CT imaging of the abdomen and pelvis was performed using the standard protocol following bolus administration of intravenous contrast. CONTRAST:  136mL ISOVUE-300 IOPAMIDOL (ISOVUE-300) INJECTION 61% COMPARISON:  03/08/2017, 12/02/2016, 11/19/2015 and 04/20/2014 CT. FINDINGS: Lower chest: Minimal basilar scarring. Heart top-normal slightly enlarged. Small hiatal hernia. Hepatobiliary: Top-normal size liver. No worrisome hepatic lesion. Post cholecystectomy. No calcified common bile duct stone. Pancreas: No pancreatic mass or inflammation. Spleen: No splenic mass or enlargement. Adrenals/Urinary Tract: Slightly atrophic right kidney with chronic pelvis/ caliceal  prominence which may reflect result of prior obstruction as noted on prior exams. No left renal collecting system obstruction. Minimally lobulated contour with tiny hyperdense lesion inferior aspect left kidney unchanged from 2015 exam. No adrenal mass. Contracted noncontrast filled urinary bladder unremarkable. Stomach/Bowel: Marked diverticulitis sigmoid colon spanning over 12.5 cm. Although there may be an abscess within the wall of inflamed sigmoid colon, no extraluminal drainable abscess or free intraperitoneal air is  noted. This segment of bowel can be assessed on follow-up after acute episode has cleared. Inflamed sigmoid colon contacts the left adnexae and posterosuperior aspect of the uterus. Small hiatal hernia.  No inflammation surrounds the appendix. Vascular/Lymphatic: Mild calcification lower abdominal aorta. No aortic aneurysm or large vessel occlusion. Increase number of pelvic lymph nodes greater on the left and lower abdominal aortic region felt to be reactive in origin. Reproductive: No primary adnexal abnormality or uterine abnormality. Other: Mild diastases rectus muscles. Musculoskeletal: Degenerative changes lumbar spine. IMPRESSION: Marked diverticulitis sigmoid colon spanning over 12.5 cm. Although there may be an abscess within the wall of inflamed sigmoid colon, no extraluminal drainable abscess or free intraperitoneal air is noted. This segment of bowel can be assessed on follow-up after acute episode has cleared. Inflamed sigmoid colon contacts the left adnexae and posterosuperior aspect of the uterus. Pelvic lymph nodes greater on the left felt to be reactive in origin. Small hiatal hernia. No obstructing renal/ureteral calculi. Electronically Signed   By: Genia Del M.D.   On: 05/18/2017 15:09   US Abdomen Limited  Result Date: 05/19/2017 CLINICAL DATA:  Elevated LFTs EXAM: ULTRASOUND ABDOMEN LIMITED RIGHT UPPER QUADRANT COMPARISON:  05/18/2017 FINDINGS: Gallbladder: Status post cholecystectomy Common bile duct: Diameter: 3 mm. Liver: No focal lesion identified. Within normal limits in parenchymal echogenicity. Portal vein is patent on color Doppler imaging with normal direction of blood flow towards the liver. IMPRESSION: Status post cholecystectomy. No acute abnormality noted. Electronically Signed   By: Inez Catalina M.D.   On: 05/19/2017 13:50        Scheduled Meds: . enoxaparin (LOVENOX) injection  50 mg Subcutaneous Q24H  . metoprolol tartrate  2.5 mg Intravenous Q6H   Continuous  Infusions: . ciprofloxacin Stopped (05/20/17 1122)  . dextrose 5 % and 0.9% NaCl 1,000 mL (05/20/17 0436)  . metronidazole Stopped (05/20/17 0855)     LOS: 2 days    Time spent: 49 mins    Shataya Winkles, MD [facility] Pager [phone]  If 7PM-7AM, please contact night-coverage [email or web address] Password [email or web address] 05/20/2017, 1:26 PM

## 2017-05-20 NOTE — Progress Notes (Signed)
Patient has home cpap equipment

## 2017-05-21 DIAGNOSIS — R1032 Left lower quadrant pain: Secondary | ICD-10-CM

## 2017-05-21 DIAGNOSIS — R945 Abnormal results of liver function studies: Secondary | ICD-10-CM

## 2017-05-21 DIAGNOSIS — R7989 Other specified abnormal findings of blood chemistry: Secondary | ICD-10-CM

## 2017-05-21 LAB — CBC
HEMATOCRIT: 39.7 % (ref 36.0–46.0)
Hemoglobin: 12.7 g/dL (ref 12.0–15.0)
MCH: 32.7 pg (ref 26.0–34.0)
MCHC: 32 g/dL (ref 30.0–36.0)
MCV: 102.3 fL — AB (ref 78.0–100.0)
Platelets: 241 10*3/uL (ref 150–400)
RBC: 3.88 MIL/uL (ref 3.87–5.11)
RDW: 13.9 % (ref 11.5–15.5)
WBC: 5.5 10*3/uL (ref 4.0–10.5)

## 2017-05-21 LAB — BASIC METABOLIC PANEL
Anion gap: 5 (ref 5–15)
BUN: 6 mg/dL (ref 6–20)
CHLORIDE: 109 mmol/L (ref 101–111)
CO2: 26 mmol/L (ref 22–32)
CREATININE: 0.86 mg/dL (ref 0.44–1.00)
Calcium: 8.3 mg/dL — ABNORMAL LOW (ref 8.9–10.3)
GFR calc non Af Amer: >60 mL/min (ref >60)
Glucose, Bld: 100 mg/dL — ABNORMAL HIGH (ref 65–99)
POTASSIUM: 4.1 mmol/L (ref 3.5–5.1)
Sodium: 140 mmol/L (ref 135–145)

## 2017-05-21 LAB — MAGNESIUM: MAGNESIUM: 1.8 mg/dL (ref 1.7–2.4)

## 2017-05-21 MED ORDER — MAGNESIUM SULFATE 4 GM/100ML IV SOLN
4.0000 g | Freq: Once | INTRAVENOUS | Status: AC
  Administered 2017-05-21: 4 g via INTRAVENOUS
  Filled 2017-05-21: qty 100

## 2017-05-21 MED ORDER — PROMETHAZINE HCL 25 MG/ML IJ SOLN
12.5000 mg | Freq: Four times a day (QID) | INTRAMUSCULAR | Status: DC | PRN
  Administered 2017-05-21 – 2017-05-22 (×2): 12.5 mg via INTRAVENOUS
  Filled 2017-05-21 (×2): qty 1

## 2017-05-21 MED ORDER — METOPROLOL SUCCINATE ER 50 MG PO TB24
50.0000 mg | ORAL_TABLET | Freq: Every day | ORAL | Status: DC
  Administered 2017-05-21: 50 mg via ORAL
  Filled 2017-05-21: qty 1

## 2017-05-21 MED ORDER — MORPHINE SULFATE (PF) 2 MG/ML IV SOLN
2.0000 mg | INTRAVENOUS | Status: DC | PRN
  Administered 2017-05-21: 4 mg via INTRAVENOUS
  Administered 2017-05-22 – 2017-05-26 (×10): 2 mg via INTRAVENOUS
  Administered 2017-05-26: 4 mg via INTRAVENOUS
  Administered 2017-05-26 – 2017-05-27 (×2): 2 mg via INTRAVENOUS
  Filled 2017-05-21 (×6): qty 1
  Filled 2017-05-21: qty 2
  Filled 2017-05-21: qty 1
  Filled 2017-05-21: qty 2
  Filled 2017-05-21 (×5): qty 1

## 2017-05-21 NOTE — Progress Notes (Signed)
[facility] Progress Note  CC:  Diverticulitis  Subjective:  Feels a little better today.  Got a little nauseated with clear liquids last evening but says that she did better with them for breakfast today.  Better seems to be slightly better.  Got one dose of morphine and 2 doses of Toradol yesterday.  Objective:  Vital signs in last 24 hours: Temp:  [97.6 F (36.4 C)-98.1 F (36.7 C)] 97.6 F (36.4 C) (11/15 0449) Pulse Rate:  [51-67] 55 (11/15 0449) Resp:  [18] 18 (11/15 0449) BP: (137-151)/(73-79) 137/76 (11/15 0449) SpO2:  [97 %-100 %] 97 % (11/15 0940) Last BM Date: 05/20/17 General:  Alert, Well-developed, in NAD Heart:  Regular rate and rhythm; no murmurs Pulm:  CTAB.  No increased WOB. Abdomen:  Soft, non-distended.  BS present.  Moderate LLQ and suprapubic TTP. Extremities:  Without edema. Neurologic:  Alert and oriented x 4;  grossly normal neurologically. Psych:  Alert and cooperative. Normal mood and affect.  Intake/Output from previous day: 11/14 0701 - 11/15 0700 In: 3227.5 [P.O.:270; I.V.:2257.5; IV Piggyback:700] Out: 1900 [Urine:1900] Intake/Output this shift: Total I/O In: -  Out: 500 [Urine:500]  Lab Results: Recent Labs    05/19/17 0109 05/20/17 0530 05/21/17 0437  WBC 5.4 4.6 5.5  HGB 12.6 11.5* 12.7  HCT 38.9 36.6 39.7  PLT 280 250 241   BMET Recent Labs    05/19/17 0109 05/20/17 0530 05/21/17 0437  NA 140 141 140  K 4.0 3.8 4.1  CL 109 110 109  CO2 23 27 26   GLUCOSE 106* 95 100*  BUN 11 9 6   CREATININE 0.69 0.81 0.86  CALCIUM 8.3* 8.2* 8.3*   LFT Recent Labs    05/20/17 0530  PROT 5.7*  ALBUMIN 2.8*  AST 34  ALT 42  ALKPHOS 70  BILITOT 0.6   Hepatitis Panel Recent Labs    05/19/17 1034  HEPBSAG Negative  HCVAB <0.1  HEPAIGM Negative  HEPBIGM Negative    US Abdomen Limited  Result Date: 05/19/2017 CLINICAL DATA:  Elevated LFTs EXAM: ULTRASOUND ABDOMEN LIMITED RIGHT UPPER QUADRANT COMPARISON:   05/18/2017 FINDINGS: Gallbladder: Status post cholecystectomy Common bile duct: Diameter: 3 mm. Liver: No focal lesion identified. Within normal limits in parenchymal echogenicity. Portal vein is patent on color Doppler imaging with normal direction of blood flow towards the liver. IMPRESSION: Status post cholecystectomy. No acute abnormality noted. Electronically Signed   By: Inez Catalina M.D.   On: 05/19/2017 13:50   Assessment / Plan: *61 year old female with recurrent sigmoid diverticulitis.  Now with a 12.5 cm segment affected in the sigmoid colon. *Multiple other medical problems   PLAN: *Continue IV cipro and flagyl for now, but could consider changing to Zosyn if she fails to improve soon. *Will allow full liquids today. *Repeat CT scan and surgical consult if does not improve or worsens. *Will certainly need surgical resection of this area in the future to prevent recurrence.  Hopefully she can avoid surgery during this acute episode.    LOS: 3 days   [name]. Zehr  05/21/2017, 9:49 AM  Pager number [phone]  I have discussed the case with the PA, and that is the plan I formulated. I personally interviewed and examined the patient.  I also personally reviewed her CT images.  She is stable and probably slowly improving.  She is still significantly tender in the LLQ but without a surgical abdomen.  Soft, good bowel sounds and passing semi-formed  to liquid stool (probably from antibiotics).  WBC normal.  She still needs hospitalization and IV Abx for at least another 24-48 hours.  Slowly advancing diet.  Will follow.  Total time 25 minutes, over half spent in counseling and coordination of care.   Nelida Meuse III Pager [phone]  Mon-Fri 8a-5p [phone] after 5p, weekends, holidays

## 2017-05-21 NOTE — Progress Notes (Signed)
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 Patient initially noted lower quadrant pain that started approximately 1 week prior to this hospital admission. Symptoms were made worse with food, sometimes relieved with passing flatus. Patient was prescribed oral Cipro and Flagyl by her PCP for which she started course on 05/15/2017. Symptoms did not improved, in fact got worse. Patient subsequently presented to the emergency department for further evaluation.  ED Course:In the emergency department, patient underwent abdominal CT with findings of severe diverticulitis involving the sigmoid colon which spans over 12.5 cm. No extraluminal drainable abscess or free intraperitoneal air is noted. She was started on IV Cipro and Flagyl in the emergency department. Of note, patient was noted to have no leukocytosis and noted to be afebrile. Hospitalist service consulted for consideration for admission.     Assessment & Plan:   Principal Problem:   Diverticulitis large intestine Active Problems:   Essential hypertension   OSA (obstructive sleep apnea)   Chronic respiratory failure with hypoxia (HCC)   Morbid (severe) obesity due to excess calories (HCC)   Symptomatic cholelithiasis  #1 acute severe diverticulitis Patient presented with lower abdominal pain CT abdomen and pelvis showing acute severe diverticulitis of the sigmoid colon over 12.5 cm with possible abscess in the wall of inflamed sigmoid colon but no extraluminal drainable abscess noted.  Patient currently afebrile.  Normal  white count.  Patient still with significant lower abdominal pain.  Tolerating clear liquids. Continue empiric IV ciprofloxacin and IV Flagyl.  Patient with multiple episodes of acute diverticulitis this year approximately 4-5 episodes.  GI has been consulted and are following the patient and I appreciate the input and recommendations.  Diet has been advanced to full liquids. Continue IV fluids.  Continue supportive care.  Will likely need to follow-up with general surgery in the outpatient setting post acute flare.  2.  Hypertension Continue current dose of oral Norvasc.  Discontinue IV metoprolol and placed on home regimen of Toprol-XL.  Continue to hold ARB.   3.  Chronic hypoxemic respiratory failure on home O2 Continue O2.  Follow.  4.  History of obstructive sleep apnea CPAP nightly.  5.  Morbid obesity  6.  Transaminitis Improved.  Acute hepatitis panel negative. follow.   DVT prophylaxis: Lovenox Code Status: Full Family Communication: Updated patient.  No family at bedside. Disposition Plan: Home once acute flare has improved and patient tolerating oral intake.   Consultants:   Gastroenterology: Dr. Henrene Pastor 05/20/2017  Procedures:   CT abdomen and pelvis 05/18/2017  Antimicrobials:   IV ciprofloxacin 05/18/2017  IV Flagyl 05/18/2017   Subjective: Patient states still with some lower abdominal pain.  Headache improved.  No emesis.  Had an episode of nausea last night with the clear liquids however tolerated it.  Patient on full liquids now.  Increase dose of morphine helped patient's pain.  Objective: Vitals:   05/21/17 0940 05/21/17 0956 05/21/17 1413 05/21/17 1442  BP:  (!) 105/57 126/84   Pulse:  (!) 51 (!) 58   Resp:  14 16   Temp:  98.3 F (36.8 C) 98.2 F (36.8 C)  TempSrc:  Oral Oral   SpO2: 97% 97% 96% 93%  Weight:      Height:        Intake/Output Summary (Last 24 hours) at 05/21/2017 1836 Last data filed at 05/21/2017 1547 Gross per 24 hour    Intake 1947.08 ml  Output 2750 ml  Net -802.92 ml   Filed Weights   05/18/17 1202  Weight: 102.5 kg (226 lb)    Examination:  General exam: NAD. Respiratory system: Clear to auscultation.  No wheezes, no crackles, no rhonchi.  Respiratory effort normal. Cardiovascular system: S1 & S2 heard, RRR. No JVD, murmurs, rubs, gallops or clicks. No pedal edema. Gastrointestinal system: Abdomen is soft, nondistended, tender to palpation in the suprapubic region.  Left greater than right. hyperactive bowel sounds.  Central nervous system: Alert and oriented. No focal neurological deficits. Extremities: Symmetric 5 x 5 power. Skin: No rashes, lesions or ulcers Psychiatry: Judgement and insight appear normal. Mood & affect appropriate.     Data Reviewed: I have personally reviewed following labs and imaging studies  CBC: Recent Labs  Lab 05/18/17 1230 05/19/17 0109 05/20/17 0530 05/21/17 0437  WBC 6.6 5.4 4.6 5.5  HGB 13.5 12.6 11.5* 12.7  HCT 40.7 38.9 36.6 39.7  MCV 100.0 101.0* 102.5* 102.3*  PLT 330 280 250 295   Basic Metabolic Panel: Recent Labs  Lab 05/18/17 1230 05/19/17 0109 05/20/17 0530 05/21/17 0437  NA 141 140 141 140  K 4.4 4.0 3.8 4.1  CL 107 109 110 109  CO2 28 23 27 26   GLUCOSE 97 106* 95 100*  BUN 14 11 9 6   CREATININE 0.96 0.69 0.81 0.86  CALCIUM 8.7* 8.3* 8.2* 8.3*  MG  --   --   --  1.8   GFR: Estimated Creatinine Clearance: 79.1 mL/min (by C-G formula based on SCr of 0.86 mg/dL). Liver Function Tests: Recent Labs  Lab 05/18/17 1230 05/19/17 0109 05/20/17 0530  AST 31 116* 34  ALT 26 55* 42  ALKPHOS 77 80 70  BILITOT 1.2 0.9 0.6  PROT 7.4 6.3* 5.7*  ALBUMIN 3.4* 3.1* 2.8*   Recent Labs  Lab 05/18/17 1230  LIPASE 22   No results for input(s): AMMONIA in the last 168 hours. Coagulation Profile: No results for input(s): INR, PROTIME in the last 168 hours. Cardiac Enzymes: Recent Labs  Lab 05/18/17 1853 05/19/17 0109  05/19/17 0626  TROPONINI <0.03 <0.03 <0.03   BNP (last 3 results) No results for input(s): PROBNP in the last 8760 hours. HbA1C: No results for input(s): HGBA1C in the last 72 hours. CBG: No results for input(s): GLUCAP in the last 168 hours. Lipid Profile: No results for input(s): CHOL, HDL, LDLCALC, TRIG, CHOLHDL, LDLDIRECT in the last 72 hours. Thyroid Function Tests: No results for input(s): TSH, T4TOTAL, FREET4, T3FREE, THYROIDAB in the last 72 hours. Anemia Panel: No results for input(s): VITAMINB12, FOLATE, FERRITIN, TIBC, IRON, RETICCTPCT in the last 72 hours. Sepsis Labs: No results for input(s): PROCALCITON, LATICACIDVEN in the last 168 hours.  No results found for this or any previous visit (from the past 240 hour(s)).       Radiology Studies: No results found.      Scheduled Meds: . amLODipine  10 mg Oral Daily  . enoxaparin (LOVENOX) injection  50 mg Subcutaneous Q24H  . fluticasone  2 spray Each Nare Daily  . metoprolol tartrate  2.5 mg Intravenous Q6H  . mometasone-formoterol  2 puff Inhalation BID  . oxybutynin  5  mg Oral QHS  . topiramate  50 mg Oral BID   Continuous Infusions: . ciprofloxacin Stopped (05/21/17 1120)  . dextrose 5 % and 0.9% NaCl 50 mL/hr at 05/21/17 1017  . metronidazole Stopped (05/21/17 2550)     LOS: 3 days    Time spent: 35 mins    Irine Seal, MD [facility] Pager [phone]  If 7PM-7AM, please contact night-coverage [email or web address] Password [email or web address] 05/21/2017, 6:36 PM

## 2017-05-22 ENCOUNTER — Other Ambulatory Visit: Payer: Self-pay

## 2017-05-22 DIAGNOSIS — R110 Nausea: Secondary | ICD-10-CM

## 2017-05-22 DIAGNOSIS — R197 Diarrhea, unspecified: Secondary | ICD-10-CM

## 2017-05-22 LAB — BASIC METABOLIC PANEL
Anion gap: 6 (ref 5–15)
BUN: 6 mg/dL (ref 6–20)
CHLORIDE: 110 mmol/L (ref 101–111)
CO2: 26 mmol/L (ref 22–32)
CREATININE: 0.93 mg/dL (ref 0.44–1.00)
Calcium: 8.4 mg/dL — ABNORMAL LOW (ref 8.9–10.3)
GFR calc Af Amer: >60 mL/min (ref >60)
GFR calc non Af Amer: >60 mL/min (ref >60)
GLUCOSE: 103 mg/dL — AB (ref 65–99)
Potassium: 3.9 mmol/L (ref 3.5–5.1)
SODIUM: 142 mmol/L (ref 135–145)

## 2017-05-22 LAB — CBC WITH DIFFERENTIAL/PLATELET
Basophils Absolute: 0 10*3/uL (ref 0.0–0.1)
Basophils Relative: 0 %
EOS ABS: 0.1 10*3/uL (ref 0.0–0.7)
EOS PCT: 3 %
HCT: 35.3 % — ABNORMAL LOW (ref 36.0–46.0)
Hemoglobin: 11.3 g/dL — ABNORMAL LOW (ref 12.0–15.0)
LYMPHS ABS: 1.3 10*3/uL (ref 0.7–4.0)
Lymphocytes Relative: 25 %
MCH: 32.4 pg (ref 26.0–34.0)
MCHC: 32 g/dL (ref 30.0–36.0)
MCV: 101.1 fL — ABNORMAL HIGH (ref 78.0–100.0)
MONO ABS: 0.6 10*3/uL (ref 0.1–1.0)
MONOS PCT: 11 %
Neutro Abs: 3.2 10*3/uL (ref 1.7–7.7)
Neutrophils Relative %: 61 %
PLATELETS: 265 10*3/uL (ref 150–400)
RBC: 3.49 MIL/uL — ABNORMAL LOW (ref 3.87–5.11)
RDW: 14 % (ref 11.5–15.5)
WBC: 5.2 10*3/uL (ref 4.0–10.5)

## 2017-05-22 LAB — MAGNESIUM: Magnesium: 2.2 mg/dL (ref 1.7–2.4)

## 2017-05-22 MED ORDER — PROMETHAZINE HCL 25 MG/ML IJ SOLN
25.0000 mg | Freq: Four times a day (QID) | INTRAMUSCULAR | Status: DC | PRN
  Administered 2017-05-22 – 2017-05-26 (×5): 25 mg via INTRAVENOUS
  Filled 2017-05-22 (×6): qty 1

## 2017-05-22 MED ORDER — PROCHLORPERAZINE EDISYLATE 5 MG/ML IJ SOLN
10.0000 mg | Freq: Four times a day (QID) | INTRAMUSCULAR | Status: DC | PRN
  Administered 2017-05-22 – 2017-05-26 (×6): 10 mg via INTRAVENOUS
  Filled 2017-05-22 (×6): qty 2

## 2017-05-22 MED ORDER — AMLODIPINE BESYLATE 5 MG PO TABS
5.0000 mg | ORAL_TABLET | Freq: Every day | ORAL | Status: DC
  Administered 2017-05-23 – 2017-05-27 (×5): 5 mg via ORAL
  Filled 2017-05-22 (×5): qty 1

## 2017-05-22 MED ORDER — BOOST / RESOURCE BREEZE PO LIQD
1.0000 | Freq: Three times a day (TID) | ORAL | Status: DC
  Administered 2017-05-22 – 2017-05-24 (×4): 1 via ORAL

## 2017-05-22 NOTE — Progress Notes (Signed)
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 Patient initially noted lower quadrant pain that started approximately 1 week prior to this hospital admission. Symptoms were made worse with food, sometimes relieved with passing flatus. Patient was prescribed oral Cipro and Flagyl by her PCP for which she started course on 05/15/2017. Symptoms did not improved, in fact got worse. Patient subsequently presented to the emergency department for further evaluation.  ED Course:In the emergency department, patient underwent abdominal CT with findings of severe diverticulitis involving the sigmoid colon which spans over 12.5 cm. No extraluminal drainable abscess or free intraperitoneal air is noted. She was started on IV Cipro and Flagyl in the emergency department. Of note, patient was noted to have no leukocytosis and noted to be afebrile. Hospitalist service consulted for consideration for admission.     Assessment & Plan:   Principal Problem:   Diverticulitis large intestine Active Problems:   Essential hypertension   OSA (obstructive sleep apnea)   Chronic respiratory failure with hypoxia (HCC)   Morbid (severe) obesity due to excess calories (HCC)   Symptomatic cholelithiasis   LFT elevation  #1 acute severe diverticulitis Patient presented with lower abdominal pain CT abdomen and pelvis showing acute severe diverticulitis of the sigmoid colon over 12.5 cm with possible abscess in the wall of inflamed sigmoid colon but no extraluminal drainable abscess noted.  Patient currently  afebrile.  Normal white count.  Patient still with significant lower abdominal pain.  Complaining of significant nausea today 05/22/2017.  Tolerating full liquids. Continue empiric IV ciprofloxacin and IV Flagyl.  Patient with multiple episodes of acute diverticulitis this year approximately 4-5 episodes.  GI has been consulted and are following the patient and I appreciate the input and recommendations.  Continue IV fluids.  Continue supportive care.  Will likely need to follow-up with general surgery in the outpatient setting post acute flare.  2.  Hypertension Continue current dose of oral Norvasc.  Discontinued IV metoprolol and placed on home regimen of Toprol-XL.  Continue to hold ARB.  Blood pressure somewhat borderline today and as such we will discontinue Toprol-XL.  3.  Chronic hypoxemic respiratory failure on home O2 Continue O2.  Follow.  4.  History of obstructive sleep apnea CPAP nightly.  5.  Morbid obesity  6.  Transaminitis Improved.  Acute hepatitis panel negative. follow.   DVT prophylaxis: Lovenox Code Status: Full Family Communication: Updated patient.  No family at bedside. Disposition Plan: Home once acute flare has improved and patient tolerating oral intake.   Consultants:   Gastroenterology: Dr. Henrene Pastor 05/20/2017  Procedures:   CT abdomen and pelvis 05/18/2017  Antimicrobials:   IV ciprofloxacin 05/18/2017  IV Flagyl 05/18/2017   Subjective: Patient states doing so well today.  Patient complaining of significant nausea and lower abdominal pain.  Patient also complaining of loose stools.  No emesis.   Objective: Vitals:   05/21/17 1413 05/21/17 1442 05/22/17 0537 05/22/17 1017  BP: 126/84  121/70 (!) 164/79  Pulse: (!) 58  (!) 55   Resp: 16  16   Temp: 98.2 F (36.8 C)  97.7 F (36.5 C)  TempSrc: Oral  Oral   SpO2: 96% 93% 99%   Weight:      Height:        Intake/Output Summary (Last 24 hours) at 05/22/2017 1315 Last data filed at  05/22/2017 1000 Gross per 24 hour  Intake 3127.08 ml  Output 4450 ml  Net -1322.92 ml   Filed Weights   05/18/17 1202  Weight: 102.5 kg (226 lb)    Examination:  General exam: NAD. Respiratory system: Clear to auscultation.  No wheezes, no crackles, no rhonchi.  Respiratory effort normal. Cardiovascular system: S1 & S2 heard, RRR. No JVD, murmurs, rubs, gallops or clicks. No pedal edema. Gastrointestinal system: Abdomen is soft, nondistended, tender to palpation in the suprapubic region L > R, hyperactive bowel sounds.  Central nervous system: Alert and oriented. No focal neurological deficits. Extremities: Symmetric 5 x 5 power. Skin: No rashes, lesions or ulcers Psychiatry: Judgement and insight appear normal. Mood & affect appropriate.     Data Reviewed: I have personally reviewed following labs and imaging studies  CBC: Recent Labs  Lab 05/18/17 1230 05/19/17 0109 05/20/17 0530 05/21/17 0437 05/22/17 0508  WBC 6.6 5.4 4.6 5.5 5.2  NEUTROABS  --   --   --   --  3.2  HGB 13.5 12.6 11.5* 12.7 11.3*  HCT 40.7 38.9 36.6 39.7 35.3*  MCV 100.0 101.0* 102.5* 102.3* 101.1*  PLT 330 280 250 241 833   Basic Metabolic Panel: Recent Labs  Lab 05/18/17 1230 05/19/17 0109 05/20/17 0530 05/21/17 0437 05/22/17 0508  NA 141 140 141 140 142  K 4.4 4.0 3.8 4.1 3.9  CL 107 109 110 109 110  CO2 28 23 27 26 26   GLUCOSE 97 106* 95 100* 103*  BUN 14 11 9 6 6   CREATININE 0.96 0.69 0.81 0.86 0.93  CALCIUM 8.7* 8.3* 8.2* 8.3* 8.4*  MG  --   --   --  1.8 2.2   GFR: Estimated Creatinine Clearance: 73.1 mL/min (by C-G formula based on SCr of 0.93 mg/dL). Liver Function Tests: Recent Labs  Lab 05/18/17 1230 05/19/17 0109 05/20/17 0530  AST 31 116* 34  ALT 26 55* 42  ALKPHOS 77 80 70  BILITOT 1.2 0.9 0.6  PROT 7.4 6.3* 5.7*  ALBUMIN 3.4* 3.1* 2.8*   Recent Labs  Lab 05/18/17 1230  LIPASE 22   No results for input(s): AMMONIA in the last 168 hours. Coagulation  Profile: No results for input(s): INR, PROTIME in the last 168 hours. Cardiac Enzymes: Recent Labs  Lab 05/18/17 1853 05/19/17 0109 05/19/17 0626  TROPONINI <0.03 <0.03 <0.03   BNP (last 3 results) No results for input(s): PROBNP in the last 8760 hours. HbA1C: No results for input(s): HGBA1C in the last 72 hours. CBG: No results for input(s): GLUCAP in the last 168 hours. Lipid Profile: No results for input(s): CHOL, HDL, LDLCALC, TRIG, CHOLHDL, LDLDIRECT in the last 72 hours. Thyroid Function Tests: No results for input(s): TSH, T4TOTAL, FREET4, T3FREE, THYROIDAB in the last 72 hours. Anemia Panel: No results for input(s): VITAMINB12, FOLATE, FERRITIN, TIBC, IRON, RETICCTPCT in the last 72 hours. Sepsis Labs: No results for input(s): PROCALCITON, LATICACIDVEN in the last 168 hours.  No results found for this or any previous visit (from the past 240 hour(s)).       Radiology Studies: No results found.      Scheduled Meds: . amLODipine  10 mg Oral Daily  . enoxaparin (LOVENOX) injection  50 mg Subcutaneous Q24H  .  feeding supplement  1 Container Oral TID BM  . fluticasone  2 spray Each Nare Daily  . metoprolol succinate  50 mg Oral QHS  . mometasone-formoterol  2 puff Inhalation BID  . oxybutynin  5 mg Oral QHS  . topiramate  50 mg Oral BID   Continuous Infusions: . ciprofloxacin Stopped (05/22/17 1047)  . dextrose 5 % and 0.9% NaCl 50 mL/hr at 05/21/17 2141  . metronidazole Stopped (05/22/17 1586)     LOS: 4 days    Time spent: 35 mins    Irine Seal, MD [facility] Pager [phone]  If 7PM-7AM, please contact night-coverage [email or web address] Password [email or web address] 05/22/2017, 1:15 PM

## 2017-05-22 NOTE — Progress Notes (Signed)
OT Cancellation Note  Patient Details Name: Ann [name] MRN: [id number] DOB: Jan 10, 1956   Cancelled Treatment:    Reason Eval/Treat Not Completed: PT screened, no needs identified, will sign off  Adem Costlow 05/22/2017, 12:51 PM  Lesle Chris, OTR/L [phone] 05/22/2017

## 2017-05-22 NOTE — Progress Notes (Signed)
PT Cancellation Note  Patient Details Name: Ann [name] MRN: [id number] DOB: January 14, 1956   Cancelled Treatment:    Reason Eval/Treat Not Completed: PT screened, no needs identified, will sign off   [facility] 05/22/2017, 12:16 PM

## 2017-05-22 NOTE — Progress Notes (Addendum)
     [name]  CC:  Diverticulitis   Subjective:  Pain is better but very nauseated this AM.  Has been tolerating some full liquids, however.  Still having some diarrhea.  Objective:  Vital signs in last 24 hours: Temp:  [97.7 F (36.5 C)-98.3 F (36.8 C)] 97.7 F (36.5 C) (11/16 0537) Pulse Rate:  [51-58] 55 (11/16 0537) Resp:  [14-16] 16 (11/16 0537) BP: (105-126)/(57-84) 121/70 (11/16 0537) SpO2:  [93 %-99 %] 99 % (11/16 0537) Last BM Date: 05/22/17 General:  Alert, Well-developed, in NAD Heart:  Slightly bradycardic; no murmurs Pulm:  CTAB.  No increased WOB. Abdomen:  Soft, non-distended.  BS present.  Moderate LLQ TTP. Extremities:  Without edema. Neurologic:  Alert and oriented x 4;  grossly normal neurologically. Psych:  Alert and cooperative. Normal mood and affect.  Intake/Output from previous day: 11/15 0701 - 11/16 0700 In: 1797.1 [P.O.:840; I.V.:657.1; IV Piggyback:300] Out: 6160 [Urine:5150] Intake/Output this shift: Total I/O In: 240 [P.O.:240] Out: -   Lab Results: Recent Labs    05/20/17 0530 05/21/17 0437 05/22/17 0508  WBC 4.6 5.5 5.2  HGB 11.5* 12.7 11.3*  HCT 36.6 39.7 35.3*  PLT 250 241 265   BMET Recent Labs    05/20/17 0530 05/21/17 0437 05/22/17 0508  NA 141 140 142  K 3.8 4.1 3.9  CL 110 109 110  CO2 27 26 26   GLUCOSE 95 100* 103*  BUN 9 6 6   CREATININE 0.81 0.86 0.93  CALCIUM 8.2* 8.3* 8.4*   LFT Recent Labs    05/20/17 0530  PROT 5.7*  ALBUMIN 2.8*  AST 34  ALT 42  ALKPHOS 70  BILITOT 0.6   Hepatitis Panel Recent Labs    05/19/17 1034  HEPBSAG Negative  HCVAB <0.1  HEPAIGM Negative  HEPBIGM Negative   ROS:  No chest pain or dyspnea  Assessment / Plan: *61 year old female with recurrent sigmoid diverticulitis. Now with a 12.5 cm segment affected in the sigmoid colon.  Pain improving but still with a lot of nausea and some diarrhea, which is very likely from the IV  antibiotics. *Multiple other medical problems  -Continue IV cipro and flagyl for now, but could consider changing to Zosyn if she fails to improve soon. -Will continue full liquids for now.  I have added Boost Breeze TID. -Repeat CT scan and surgical consult if does not continue to improve or worsens. -Will certainly need surgical resection of this area in the future to prevent recurrence. Hopefully she can avoid surgery during this acute episode.    LOS: 4 days   [name]. Zehr  05/22/2017, 9:44 AM  Pager number [phone]   I have discussed the case with the PA, and that is the plan I formulated. I personally interviewed and examined the patient, with the PA acting as my scribe.  Slow improvement in diverticulitis, but still significantly tender in LLQ and with Abx-induced nausea and diarrhea.  She needs to remain in hospital for IV antibiotics and IV fluid support b/c oral intake limited.  Dr. Henrene Pastor to see tomorrow.  Total time 25 minutes face to face, over half spent in counseling and coordination of care.  Nelida Meuse III Pager [phone]  Mon-Fri 8a-5p [phone] after 5p, weekends, holidays

## 2017-05-23 ENCOUNTER — Other Ambulatory Visit (HOSPITAL_COMMUNITY): Payer: Self-pay | Admitting: *Deleted

## 2017-05-23 ENCOUNTER — Inpatient Hospital Stay (HOSPITAL_COMMUNITY): Payer: Medicaid Other

## 2017-05-23 ENCOUNTER — Other Ambulatory Visit: Payer: Self-pay

## 2017-05-23 LAB — BASIC METABOLIC PANEL
ANION GAP: 6 (ref 5–15)
BUN: <5 mg/dL — ABNORMAL LOW (ref 6–20)
CHLORIDE: 110 mmol/L (ref 101–111)
CO2: 25 mmol/L (ref 22–32)
Calcium: 8.5 mg/dL — ABNORMAL LOW (ref 8.9–10.3)
Creatinine, Ser: 0.96 mg/dL (ref 0.44–1.00)
GFR calc non Af Amer: >60 mL/min (ref >60)
Glucose, Bld: 79 mg/dL (ref 65–99)
Potassium: 3.4 mmol/L — ABNORMAL LOW (ref 3.5–5.1)
Sodium: 141 mmol/L (ref 135–145)

## 2017-05-23 LAB — CBC WITH DIFFERENTIAL/PLATELET
Basophils Absolute: 0 10*3/uL (ref 0.0–0.1)
Basophils Relative: 0 %
Eosinophils Absolute: 0.2 10*3/uL (ref 0.0–0.7)
Eosinophils Relative: 3 %
HEMATOCRIT: 39.3 % (ref 36.0–46.0)
HEMOGLOBIN: 12.6 g/dL (ref 12.0–15.0)
LYMPHS ABS: 1.5 10*3/uL (ref 0.7–4.0)
LYMPHS PCT: 31 %
MCH: 32.5 pg (ref 26.0–34.0)
MCHC: 32.1 g/dL (ref 30.0–36.0)
MCV: 101.3 fL — ABNORMAL HIGH (ref 78.0–100.0)
MONOS PCT: 9 %
Monocytes Absolute: 0.4 10*3/uL (ref 0.1–1.0)
NEUTROS ABS: 2.6 10*3/uL (ref 1.7–7.7)
NEUTROS PCT: 57 %
Platelets: 298 10*3/uL (ref 150–400)
RBC: 3.88 MIL/uL (ref 3.87–5.11)
RDW: 14 % (ref 11.5–15.5)
WBC: 4.7 10*3/uL (ref 4.0–10.5)

## 2017-05-23 LAB — MAGNESIUM: Magnesium: 1.8 mg/dL (ref 1.7–2.4)

## 2017-05-23 MED ORDER — IOPAMIDOL (ISOVUE-300) INJECTION 61%
INTRAVENOUS | Status: AC
  Administered 2017-05-23: 100 mL via INTRAVENOUS
  Filled 2017-05-23: qty 100

## 2017-05-23 MED ORDER — POTASSIUM CHLORIDE CRYS ER 20 MEQ PO TBCR
40.0000 meq | EXTENDED_RELEASE_TABLET | Freq: Once | ORAL | Status: AC
Start: 2017-05-23 — End: 2017-05-23
  Administered 2017-05-23: 40 meq via ORAL
  Filled 2017-05-23: qty 2

## 2017-05-23 MED ORDER — MAGNESIUM SULFATE 4 GM/100ML IV SOLN
4.0000 g | Freq: Once | INTRAVENOUS | Status: AC
  Administered 2017-05-23: 4 g via INTRAVENOUS
  Filled 2017-05-23: qty 100

## 2017-05-23 MED ORDER — IOPAMIDOL (ISOVUE-300) INJECTION 61%
30.0000 mL | Freq: Once | INTRAVENOUS | Status: AC | PRN
  Administered 2017-05-23: 30 mL via ORAL

## 2017-05-23 NOTE — Progress Notes (Signed)
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 Patient initially noted lower quadrant pain that started approximately 1 week prior to this hospital admission. Symptoms were made worse with food, sometimes relieved with passing flatus. Patient was prescribed oral Cipro and Flagyl by her PCP for which she started course on 05/15/2017. Symptoms did not improved, in fact got worse. Patient subsequently presented to the emergency department for further evaluation.  ED Course:In the emergency department, patient underwent abdominal CT with findings of severe diverticulitis involving the sigmoid colon which spans over 12.5 cm. No extraluminal drainable abscess or free intraperitoneal air is noted. She was started on IV Cipro and Flagyl in the emergency department. Of note, patient was noted to have no leukocytosis and noted to be afebrile. Hospitalist service consulted for consideration for admission.     Assessment & Plan:   Principal Problem:   Diverticulitis large intestine Active Problems:   Essential hypertension   OSA (obstructive sleep apnea)   Chronic respiratory failure with hypoxia (HCC)   Morbid (severe) obesity due to excess calories (HCC)   Symptomatic cholelithiasis   LFT elevation  #1 acute severe diverticulitis Patient presented with lower abdominal pain CT abdomen and pelvis showing acute severe diverticulitis of the sigmoid colon over 12.5 cm with possible abscess in the wall of inflamed sigmoid colon but no extraluminal drainable abscess noted.  Patient currently  afebrile.  Normal white count.  Patient states some improvement with lower abdominal pain.  Less nauseous today.  Full liquids have been scaled back to clear liquids per gastroenterology. Continue empiric IV ciprofloxacin and IV Flagyl.  Patient with multiple episodes of acute diverticulitis this year approximately 4-5 episodes.  GI has been consulted and are following the patient and I appreciate the input and recommendations.  Continue IV fluids.  Continue supportive care.  Repeat CT abdomen and pelvis pending per GI to decide plan of care.  GI following and appreciate their input and recommendations.    2.  Hypertension Continue current dose of oral Norvasc. Continue to hold ARB and Toprol-XL.  Follow.    3.  Chronic hypoxemic respiratory failure on home O2 Continue O2.  Follow.  4.  History of obstructive sleep apnea CPAP nightly.  5.  Morbid obesity  6.  Transaminitis Improved.  Acute hepatitis panel negative. follow.   DVT prophylaxis: Lovenox Code Status: Full Family Communication: Updated patient.  No family at bedside. Disposition Plan: Home once acute flare has improved and patient tolerating oral intake and per GI.   Consultants:   Gastroenterology: Dr. Henrene Pastor 05/20/2017  Procedures:   CT abdomen and pelvis 05/18/2017  Antimicrobials:   IV ciprofloxacin 05/18/2017  IV Flagyl 05/18/2017   Subjective: Patient states feels better today.  Nausea improved.  Patient states abdominal pain improving.  Patient asking whether she might be able to go home today to be able to make it to her church service tomorrow.   Objective: Vitals:   05/22/17 1017 05/22/17 1415 05/22/17 2231 05/23/17 0510  BP: (!) 164/79 (!) 105/44 115/69 120/71  Pulse:  67 60 61  Resp:  16 16 16   Temp:  99 F (37.2 C) 98.6 F (37 C) 98 F (36.7 C)  TempSrc:  Oral Oral Oral  SpO2:  92% 94% 100%  Weight:      Height:        Intake/Output Summary (Last 24 hours) at 05/23/2017 1359 Last data  filed at 05/23/2017 1000 Gross per 24 hour  Intake 2245 ml  Output 2100 ml  Net 145 ml   Filed Weights   05/18/17 1202  Weight: 102.5 kg (226 lb)    Examination:  General exam: NAD. Respiratory system: Clear to auscultation.  No wheezes, no crackles, no rhonchi.  Respiratory effort normal. Cardiovascular system: S1 & S2 heard, RRR. No JVD, murmurs, rubs, gallops or clicks. No pedal edema. Gastrointestinal system: Abdomen is soft, nondistended, less tender to palpation in the suprapubic region L > R, hyperactive bowel sounds.  Central nervous system: Alert and oriented. No focal neurological deficits. Extremities: Symmetric 5 x 5 power. Skin: No rashes, lesions or ulcers Psychiatry: Judgement and insight appear normal. Mood & affect appropriate.     Data Reviewed: I have personally reviewed following labs and imaging studies  CBC: Recent Labs  Lab 05/19/17 0109 05/20/17 0530 05/21/17 0437 05/22/17 0508 05/23/17 0917  WBC 5.4 4.6 5.5 5.2 4.7  NEUTROABS  --   --   --  3.2 2.6  HGB 12.6 11.5* 12.7 11.3* 12.6  HCT 38.9 36.6 39.7 35.3* 39.3  MCV 101.0* 102.5* 102.3* 101.1* 101.3*  PLT 280 250 241 265 371   Basic Metabolic Panel: Recent Labs  Lab 05/19/17 0109 05/20/17 0530 05/21/17 0437 05/22/17 0508 05/23/17 0917  NA 140 141 140 142 141  K 4.0 3.8 4.1 3.9 3.4*  CL 109 110 109 110 110  CO2 23 27 26 26 25   GLUCOSE 106* 95 100* 103* 79  BUN 11 9 6 6  <5*  CREATININE 0.69 0.81 0.86 0.93 0.96  CALCIUM 8.3* 8.2* 8.3* 8.4* 8.5*  MG  --   --  1.8 2.2 1.8   GFR: Estimated Creatinine Clearance: 70.8 mL/min (by C-G formula based on SCr of 0.96 mg/dL). Liver Function Tests: Recent Labs  Lab 05/18/17 1230 05/19/17 0109 05/20/17 0530  AST 31 116* 34  ALT 26 55* 42  ALKPHOS 77 80 70  BILITOT 1.2 0.9 0.6  PROT 7.4 6.3* 5.7*  ALBUMIN 3.4* 3.1* 2.8*   Recent Labs  Lab 05/18/17 1230  LIPASE 22   No results for input(s): AMMONIA in the last 168 hours. Coagulation  Profile: No results for input(s): INR, PROTIME in the last 168 hours. Cardiac Enzymes: Recent Labs  Lab 05/18/17 1853 05/19/17 0109 05/19/17 0626  TROPONINI <0.03 <0.03 <0.03   BNP (last 3 results) No results for input(s): PROBNP in the last 8760 hours. HbA1C: No results for input(s): HGBA1C in the last 72 hours. CBG: No results for input(s): GLUCAP in the last 168 hours. Lipid Profile: No results for input(s): CHOL, HDL, LDLCALC, TRIG, CHOLHDL, LDLDIRECT in the last 72 hours. Thyroid Function Tests: No results for input(s): TSH, T4TOTAL, FREET4, T3FREE, THYROIDAB in the last 72 hours. Anemia Panel: No results for input(s): VITAMINB12, FOLATE, FERRITIN, TIBC, IRON, RETICCTPCT in the last 72 hours. Sepsis Labs: No results for input(s): PROCALCITON, LATICACIDVEN in the last 168 hours.  No results found for this or any previous visit (from the past 240 hour(s)).       Radiology Studies: No results found.      Scheduled Meds: . amLODipine  5 mg Oral Daily  .  enoxaparin (LOVENOX) injection  50 mg Subcutaneous Q24H  . feeding supplement  1 Container Oral TID BM  . fluticasone  2 spray Each Nare Daily  . mometasone-formoterol  2 puff Inhalation BID  . oxybutynin  5 mg Oral QHS  . potassium chloride  40 mEq Oral Once  . topiramate  50 mg Oral BID   Continuous Infusions: . ciprofloxacin Stopped (05/23/17 1147)  . dextrose 5 % and 0.9% NaCl 75 mL/hr at 05/23/17 1045  . metronidazole Stopped (05/23/17 0949)     LOS: 5 days    Time spent: 35 mins    Irine Seal, MD [facility] Pager [phone]  If 7PM-7AM, please contact night-coverage [email or web address] Password [email or web address] 05/23/2017, 1:59 PM

## 2017-05-23 NOTE — Progress Notes (Signed)
Daily Rounding Note  05/23/2017, 1:04 PM  LOS: 5 days   SUBJECTIVE:   Chief complaint:  Abdominal pain recurrent after Am meal of full liquids: yogurt and pudding.  Stools are loose. Did not eat lunch due to fear of worsening pain again.  Wondering if she could go home on oral abx as her 59 y/o husband is dying with colon cancer and she really wants to take him to the annual  thanksgiving program/celebration at their church tomorrow.      OBJECTIVE:         Vital signs in last 24 hours:    Temp:  [98 F (36.7 C)-99 F (37.2 C)] 98 F (36.7 C) (11/17 0510) Pulse Rate:  [60-67] 61 (11/17 0510) Resp:  [16] 16 (11/17 0510) BP: (105-120)/(44-71) 120/71 (11/17 0510) SpO2:  [92 %-100 %] 100 % (11/17 0510) Last BM Date: 05/23/17 Filed Weights   05/18/17 1202  Weight: 102.5 kg (226 lb)   General: pale, comfortable.  Not acutely uncomfortable or ill looking   Heart: RRR Chest: clear bil.  No sob or cough Abdomen: soft.  ND.  Tender left mid/lower.  No guard or rebound  Extremities: no CCE Neuro/Psych:  Oriented x 3.  No gross deficits.    Intake/Output from previous day: 11/16 0701 - 11/17 0700 In: 2500 [P.O.:300; I.V.:1400; IV Piggyback:800] Out: 2100 [Urine:2100]  Intake/Output this shift: Total I/O In: 1745 [I.V.:1345; IV Piggyback:400] Out: -   Lab Results: Recent Labs    05/21/17 0437 05/22/17 0508 05/23/17 0917  WBC 5.5 5.2 4.7  HGB 12.7 11.3* 12.6  HCT 39.7 35.3* 39.3  PLT 241 265 298   BMET Recent Labs    05/21/17 0437 05/22/17 0508 05/23/17 0917  NA 140 142 141  K 4.1 3.9 3.4*  CL 109 110 110  CO2 26 26 25   GLUCOSE 100* 103* 79  BUN 6 6 <5*  CREATININE 0.86 0.93 0.96  CALCIUM 8.3* 8.4* 8.5*   LFT No results for input(s): PROT, ALBUMIN, AST, ALT, ALKPHOS, BILITOT, BILIDIR, IBILI in the last 72 hours. PT/INR No results for input(s): LABPROT, INR in the last 72 hours. Hepatitis Panel No  results for input(s): HEPBSAG, HCVAB, HEPAIGM, HEPBIGM in the last 72 hours.  Studies/Results: No results found.   Scheduled Meds: . amLODipine  5 mg Oral Daily  . enoxaparin (LOVENOX) injection  50 mg Subcutaneous Q24H  . feeding supplement  1 Container Oral TID BM  . fluticasone  2 spray Each Nare Daily  . mometasone-formoterol  2 puff Inhalation BID  . oxybutynin  5 mg Oral QHS  . topiramate  50 mg Oral BID   Continuous Infusions: . ciprofloxacin Stopped (05/23/17 1147)  . dextrose 5 % and 0.9% NaCl 75 mL/hr at 05/23/17 1045  . metronidazole Stopped (05/23/17 0949)   PRN Meds:.acetaminophen **OR** acetaminophen, hydrALAZINE, ketorolac, LORazepam, morphine injection, ondansetron **OR** ondansetron (ZOFRAN) IV, prochlorperazine, promethazine   ASSESMENT:   *  Acute, recurrent sigmoid diverticulitis.   Sxs persist, not tolerating full liquids all that well but really wants to go home.  Day 6 cipro/Flagyl.     PLAN   *  Case d/w Dr Henrene Pastor, who has seen pt.  Repeat CT abd/pelvis.    Azucena Freed  05/23/2017, 1:04 PM Pager: [phone]  GI ATTENDING  Interval data reviewed. Patient person seen and examined. Agree with above progress note. Objective parameters are favorable. The patient continues to complain of abdominal  pain intermittently. This concerns her. She wants to go home possibly. Pre-significant area of inflammation on initial CT. This point, repeat CT to assess for response to treatment. This will allow [place] to triage her care.  Docia Chuck. Geri Seminole., M.D. [facility] Division of Gastroenterology

## 2017-05-24 DIAGNOSIS — K8020 Calculus of gallbladder without cholecystitis without obstruction: Secondary | ICD-10-CM

## 2017-05-24 DIAGNOSIS — R1084 Generalized abdominal pain: Secondary | ICD-10-CM

## 2017-05-24 LAB — COMPREHENSIVE METABOLIC PANEL
ALBUMIN: 3 g/dL — AB (ref 3.5–5.0)
ALK PHOS: 66 U/L (ref 38–126)
ALT: 35 U/L (ref 14–54)
ANION GAP: 5 (ref 5–15)
AST: 47 U/L — ABNORMAL HIGH (ref 15–41)
BILIRUBIN TOTAL: 0.6 mg/dL (ref 0.3–1.2)
CALCIUM: 8 mg/dL — AB (ref 8.9–10.3)
CO2: 24 mmol/L (ref 22–32)
Chloride: 111 mmol/L (ref 101–111)
Creatinine, Ser: 0.94 mg/dL (ref 0.44–1.00)
GFR calc Af Amer: >60 mL/min (ref >60)
GLUCOSE: 106 mg/dL — AB (ref 65–99)
Potassium: 3.8 mmol/L (ref 3.5–5.1)
Sodium: 140 mmol/L (ref 135–145)
TOTAL PROTEIN: 6.2 g/dL — AB (ref 6.5–8.1)

## 2017-05-24 LAB — CBC WITH DIFFERENTIAL/PLATELET
BASOS ABS: 0 10*3/uL (ref 0.0–0.1)
BASOS PCT: 0 %
EOS PCT: 3 %
Eosinophils Absolute: 0.2 10*3/uL (ref 0.0–0.7)
HCT: 38 % (ref 36.0–46.0)
Hemoglobin: 12.4 g/dL (ref 12.0–15.0)
Lymphocytes Relative: 30 %
Lymphs Abs: 1.7 10*3/uL (ref 0.7–4.0)
MCH: 32.8 pg (ref 26.0–34.0)
MCHC: 32.6 g/dL (ref 30.0–36.0)
MCV: 100.5 fL — ABNORMAL HIGH (ref 78.0–100.0)
MONO ABS: 0.6 10*3/uL (ref 0.1–1.0)
Monocytes Relative: 11 %
NEUTROS ABS: 3.1 10*3/uL (ref 1.7–7.7)
Neutrophils Relative %: 56 %
PLATELETS: 291 10*3/uL (ref 150–400)
RBC: 3.78 MIL/uL — ABNORMAL LOW (ref 3.87–5.11)
RDW: 14 % (ref 11.5–15.5)
WBC: 5.5 10*3/uL (ref 4.0–10.5)

## 2017-05-24 LAB — MAGNESIUM: Magnesium: 2.4 mg/dL (ref 1.7–2.4)

## 2017-05-24 MED ORDER — PIPERACILLIN-TAZOBACTAM 3.375 G IVPB: 3.3750 g | Freq: Four times a day (QID) | INTRAVENOUS | Status: DC

## 2017-05-24 MED ORDER — DICYCLOMINE HCL 10 MG PO CAPS: 10.0000 mg | ORAL_CAPSULE | Freq: Four times a day (QID) | ORAL | Status: DC | PRN

## 2017-05-24 MED ORDER — PIPERACILLIN-TAZOBACTAM 3.375 G IVPB
3.3750 g | Freq: Three times a day (TID) | INTRAVENOUS | Status: DC
  Administered 2017-05-24 – 2017-05-27 (×9): 3.375 g via INTRAVENOUS
  Filled 2017-05-24 (×8): qty 50

## 2017-05-24 NOTE — Progress Notes (Signed)
Daily Rounding Note  05/24/2017, 10:19 AM  LOS: 6 days   SUBJECTIVE:   Chief complaint: still has lower abdominal spastic type pain episodes.  Nausea with significant volume of any po.  Overall pain and nausea are better.  Urinating well.  Stools loose. Ct completed  OBJECTIVE:         Vital signs in last 24 hours:    Temp:  [97.9 F (36.6 C)-98.9 F (37.2 C)] 97.9 F (36.6 C) (11/18 0457) Pulse Rate:  [54-78] 54 (11/18 0457) Resp:  [16-18] 18 (11/18 0457) BP: (119-137)/(72-78) 127/72 (11/18 0457) SpO2:  [94 %-99 %] 99 % (11/18 0457) Last BM Date: 05/23/17 Filed Weights   05/18/17 1202  Weight: 102.5 kg (226 lb)   General: comfortable, pale, obese but not acutely ill looking   Heart: RRR Chest: clear bil.  No dyspnea or cough Abdomen: soft, tender left LQ  Extremities: no CCE Neuro/Psych:  Oriented x 3.  Gregarious.  No limb weakness or spasm.    Intake/Output from previous day: 11/17 0701 - 11/18 0700 In: 2888.8 [P.O.:225; I.V.:1963.8; IV Piggyback:700] Out: 3300 [Urine:3300]  Intake/Output this shift: No intake/output data recorded.  Lab Results: Recent Labs    05/22/17 0508 05/23/17 0917 05/24/17 0355  WBC 5.2 4.7 5.5  HGB 11.3* 12.6 12.4  HCT 35.3* 39.3 38.0  PLT 265 298 291   BMET Recent Labs    05/22/17 0508 05/23/17 0917 05/24/17 0355  NA 142 141 140  K 3.9 3.4* 3.8  CL 110 110 111  CO2 26 25 24   GLUCOSE 103* 79 106*  BUN 6 <5* <5*  CREATININE 0.93 0.96 0.94  CALCIUM 8.4* 8.5* 8.0*   LFT Recent Labs    05/24/17 0355  PROT 6.2*  ALBUMIN 3.0*  AST 47*  ALT 35  ALKPHOS 66  BILITOT 0.6   PT/INR No results for input(s): LABPROT, INR in the last 72 hours. Hepatitis Panel No results for input(s): HEPBSAG, HCVAB, HEPAIGM, HEPBIGM in the last 72 hours.  Studies/Results: Ct Abdomen Pelvis W Contrast  Result Date: 05/24/2017 CLINICAL DATA:  Abdominal pain.  Follow-up  diverticulitis. EXAM: CT ABDOMEN AND PELVIS WITH CONTRAST TECHNIQUE: Multidetector CT imaging of the abdomen and pelvis was performed using the standard protocol following bolus administration of intravenous contrast. CONTRAST:  <See Chart> ISOVUE-300 IOPAMIDOL (ISOVUE-300) INJECTION 61% COMPARISON:  05/18/2017 FINDINGS: Lower chest:  Unremarkable. Hepatobiliary: No focal abnormality within the liver parenchyma. Gallbladder surgically absent. No intrahepatic or extrahepatic biliary dilation. Pancreas: No focal mass lesion. No dilatation of the main duct. No intraparenchymal cyst. No peripancreatic edema. Spleen: No splenomegaly. No focal mass lesion. Adrenals/Urinary Tract: No adrenal nodule or mass. 5 mm hypoenhancing lesion posterior interpolar right kidney similar to CT scan from 08/27/2015. Left kidney unremarkable No evidence for hydroureter. The urinary bladder appears normal for the degree of distention. Stomach/Bowel: Small to moderate hiatal hernia. Duodenum is normally positioned as is the ligament of Treitz. No small bowel wall thickening. No small bowel dilatation. The terminal ileum is normal. The appendix is normal. 10 cm segment of wall thickening noted mid sigmoid colon with pericolonic edema/inflammation. Overall imaging features are very similar to 05/18/2017 and similar but slightly less prominent findings were present on the 08/27/2015 exam. 15 mm focus of soft tissue attenuation adjacent to the colon (image 74 series 2) is similar to prior and could be a small fluid collection or inflammatory change. 2.5 cm area of more focal  wall thickening is seen on image 73 of series 2. Vascular/Lymphatic: There is abdominal aortic atherosclerosis without aneurysm. There is no gastrohepatic or hepatoduodenal ligament lymphadenopathy. No intraperitoneal or retroperitoneal lymphadenopathy. No pelvic sidewall lymphadenopathy. Reproductive: The uterus has normal CT imaging appearance. There is no adnexal mass.  Other: No intraperitoneal free fluid. Musculoskeletal: Bone windows reveal no worrisome lytic or sclerotic osseous lesions. IMPRESSION: 1. Persistent wall thickening involving a 10 cm segment of sigmoid colon with pericolonic edema/inflammation similar to the study from 5 days ago. Similar but less prominent appearance noted on a study from 18 months ago. Imaging features are compatible with diverticulitis without perforation or abscess visible at this time. Patient underwent colonoscopy on 08/26/2016 with no mass lesion identified. Electronically Signed   By: Misty Stanley M.D.   On: 05/24/2017 09:07   Scheduled Meds: . amLODipine  5 mg Oral Daily  . enoxaparin (LOVENOX) injection  50 mg Subcutaneous Q24H  . feeding supplement  1 Container Oral TID BM  . fluticasone  2 spray Each Nare Daily  . mometasone-formoterol  2 puff Inhalation BID  . oxybutynin  5 mg Oral QHS  . topiramate  50 mg Oral BID   Continuous Infusions: . ciprofloxacin 400 mg (05/24/17 0930)  . dextrose 5 % and 0.9% NaCl 75 mL/hr at 05/24/17 0625  . metronidazole Stopped (05/24/17 0851)   PRN Meds:.acetaminophen **OR** acetaminophen, hydrALAZINE, LORazepam, morphine injection, ondansetron **OR** ondansetron (ZOFRAN) IV, prochlorperazine, promethazine  ASSESMENT:   *  Acute, recurrent sigmoid diverticulitis.  sxs improved but sxs worrisome for partial obstruction.     PLAN   *  Awaiting read of CT, staff assures me it is being read now and report will be out soon.    Addendum 10:19 AM:   CT with little change in sigmoid diverticulitis.  No complications including abscess, perforation, fistula.   Case d/w Dr Henrene Pastor and will stop cipro/flagyl, add Zosyn.     Azucena Freed  05/24/2017, 10:19 AM Pager: [phone]  GI ATTENDING  Interval history and data, including CT scan, reviewed. Patient personally seen and examined. Agree with interval progress note as written above. Still some fluctuating intermittent abdominal  discomfort. Exam relatively benign. Repeat CT scan shows stable changes of diverticulitis. No interval complication. She remains afebrile with normal white blood cell count. Will switch to Zosyn. Would anticipate discharge on Augmentin. Will advance diet at patient's request. Encouraged to ambulate. Antispasmodics as needed for spastic type abdominal discomfort. Discussed with the attending Dr. Grandville Silos. We will sign off but are available for questions or problems as needed. After discharge, she should have GI follow-up with Dr. Havery Moros one of his advanced practitioners in about 2 weeks. Thank you.  Docia Chuck. Geri Seminole., M.D. [facility] Division of Gastroenterology

## 2017-05-24 NOTE — Progress Notes (Signed)
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 Patient initially noted lower quadrant pain that started approximately 1 week prior to this hospital admission. Symptoms were made worse with food, sometimes relieved with passing flatus. Patient was prescribed oral Cipro and Flagyl by her PCP for which she started course on 05/15/2017. Symptoms did not improved, in fact got worse. Patient subsequently presented to the emergency department for further evaluation.  ED Course:In the emergency department, patient underwent abdominal CT with findings of severe diverticulitis involving the sigmoid colon which spans over 12.5 cm. No extraluminal drainable abscess or free intraperitoneal air is noted. She was started on IV Cipro and Flagyl in the emergency department. Of note, patient was noted to have no leukocytosis and noted to be afebrile. Hospitalist service consulted for consideration for admission.     Assessment & Plan:   Principal Problem:   Diverticulitis large intestine Active Problems:   Essential hypertension   OSA (obstructive sleep apnea)   Chronic respiratory failure with hypoxia (HCC)   Morbid (severe) obesity due to excess calories (HCC)   Symptomatic cholelithiasis   LFT elevation  #1 acute severe diverticulitis Patient presented with lower abdominal pain CT abdomen and pelvis showing acute severe diverticulitis of the sigmoid colon over 12.5 cm with possible abscess in the wall of inflamed sigmoid colon but no extraluminal drainable abscess noted.  Patient currently  afebrile.  Normal white count.  Patient states some improvement with lower abdominal pain however hesitant to eat due to concerns pain may worsen.  Nausea improving. Full liquids have been scaled back to clear liquids per gastroenterology.  Repeat CT abdomen and pelvis showing no abscess or complications.  Persistent wall thickening involving 10 cm segment of the sigmoid colon with peri-colonic edema/inflammation similar to prior study.  Due to no significant change seen on CT, IV Flagyl and ciprofloxacin have been changed to IV Zosyn.  Will put patient on a trial of a soft diet.   Patient with multiple episodes of acute diverticulitis this year approximately 4-5 episodes.  GI has been consulted and are following the patient and I appreciate the input and recommendations.  Continue IV fluids.  Continue supportive care. GI following and appreciate their input and recommendations.    2.  Hypertension Continue current dose of oral Norvasc. Continue to hold ARB and Toprol-XL.  Follow.    3.  Chronic hypoxemic respiratory failure on home O2 Stable.  Continue O2.  Follow.  4.  History of obstructive sleep apnea CPAP nightly.  5.  Morbid obesity  6.  Transaminitis Improved.  Acute hepatitis panel negative. follow.   DVT prophylaxis: Lovenox Code Status: Full Family Communication: Updated patient.  No family at bedside. Disposition Plan: Home once acute flare has improved and patient tolerating oral intake and per GI.   Consultants:   Gastroenterology: Dr. Henrene Pastor 05/20/2017  Procedures:   CT abdomen and pelvis 05/18/2017, 05/23/2017  Antimicrobials:   IV ciprofloxacin 05/18/2017>>>>>> 05/24/2017  IV Flagyl 05/18/2017>>>>> 05/24/2017   Subjective: Patient states still some abdominal pain however slowly improving.  No nausea or emesis.  Patient hesitant to eat as she  scared her stomach will start to hurt significantly again.  No chest pain.  No shortness of breath.     Objective: Vitals:   05/23/17 2210 05/23/17 2217 05/24/17 0457 05/24/17 1250  BP: 119/78  127/72 137/77  Pulse: 76 78 (!) 54 76  Resp: 18 18 18 19   Temp: 98.9 F (37.2 C)  97.9 F (36.6 C) 98.1 F (36.7 C)  TempSrc: Oral  Oral Oral  SpO2: 94% 94% 99% 96%  Weight:      Height:        Intake/Output Summary (Last 24 hours) at 05/24/2017 1349 Last data filed at 05/24/2017 1250 Gross per 24 hour  Intake 1143.75 ml  Output 4100 ml  Net -2956.25 ml   Filed Weights   05/18/17 1202  Weight: 102.5 kg (226 lb)    Examination:  General exam: NAD. Respiratory system: Clear to auscultation.  No wheezes, no crackles, no rhonchi.  Respiratory effort normal. Cardiovascular system: Regular rate and rhythm no murmurs rubs or gallops.  No pedal edema.  Gastrointestinal system: Abdomen is soft, nondistended, less tender to palpation in the suprapubic region L > R, positive bowel sounds.   Central nervous system: Alert and oriented. No focal neurological deficits. Extremities: Symmetric 5 x 5 power. Skin: No rashes, lesions or ulcers Psychiatry: Judgement and insight appear normal. Mood & affect appropriate.     Data Reviewed: I have personally reviewed following labs and imaging studies  CBC: Recent Labs  Lab 05/20/17 0530 05/21/17 0437 05/22/17 0508 05/23/17 0917 05/24/17 0355  WBC 4.6 5.5 5.2 4.7 5.5  NEUTROABS  --   --  3.2 2.6 3.1  HGB 11.5* 12.7 11.3* 12.6 12.4  HCT 36.6 39.7 35.3* 39.3 38.0  MCV 102.5* 102.3* 101.1* 101.3* 100.5*  PLT 250 241 265 298 469   Basic Metabolic Panel: Recent Labs  Lab 05/20/17 0530 05/21/17 0437 05/22/17 0508 05/23/17 0917 05/24/17 0355  NA 141 140 142 141 140  K 3.8 4.1 3.9 3.4* 3.8  CL 110 109 110 110 111  CO2 27 26 26 25 24   GLUCOSE 95 100* 103* 79 106*  BUN 9 6 6  <5* <5*  CREATININE 0.81 0.86 0.93 0.96 0.94  CALCIUM 8.2* 8.3* 8.4* 8.5* 8.0*  MG  --  1.8 2.2 1.8 2.4   GFR: Estimated Creatinine Clearance: 72.3 mL/min (by  C-G formula based on SCr of 0.94 mg/dL). Liver Function Tests: Recent Labs  Lab 05/18/17 1230 05/19/17 0109 05/20/17 0530 05/24/17 0355  AST 31 116* 34 47*  ALT 26 55* 42 35  ALKPHOS 77 80 70 66  BILITOT 1.2 0.9 0.6 0.6  PROT 7.4 6.3* 5.7* 6.2*  ALBUMIN 3.4* 3.1* 2.8* 3.0*   Recent Labs  Lab 05/18/17 1230  LIPASE 22   No results for input(s): AMMONIA in the last 168 hours. Coagulation Profile: No results for input(s): INR, PROTIME in the last 168 hours. Cardiac Enzymes: Recent Labs  Lab 05/18/17 1853 05/19/17 0109 05/19/17 0626  TROPONINI <0.03 <0.03 <0.03   BNP (last 3 results) No results for input(s): PROBNP in the last 8760 hours. HbA1C: No results for input(s): HGBA1C in the last 72 hours. CBG: No results for input(s): GLUCAP in the last 168 hours. Lipid Profile: No results for input(s): CHOL, HDL, LDLCALC, TRIG, CHOLHDL, LDLDIRECT in the last 72 hours. Thyroid Function Tests: No results for input(s): TSH, T4TOTAL, FREET4, T3FREE, THYROIDAB in the last 72 hours. Anemia Panel: No results for input(s): VITAMINB12, FOLATE, FERRITIN, TIBC, IRON, RETICCTPCT  in the last 72 hours. Sepsis Labs: No results for input(s): PROCALCITON, LATICACIDVEN in the last 168 hours.  No results found for this or any previous visit (from the past 240 hour(s)).       Radiology Studies: Ct Abdomen Pelvis W Contrast  Result Date: 05/24/2017 CLINICAL DATA:  Abdominal pain.  Follow-up diverticulitis. EXAM: CT ABDOMEN AND PELVIS WITH CONTRAST TECHNIQUE: Multidetector CT imaging of the abdomen and pelvis was performed using the standard protocol following bolus administration of intravenous contrast. CONTRAST:  <See Chart> ISOVUE-300 IOPAMIDOL (ISOVUE-300) INJECTION 61% COMPARISON:  05/18/2017 FINDINGS: Lower chest:  Unremarkable. Hepatobiliary: No focal abnormality within the liver parenchyma. Gallbladder surgically absent. No intrahepatic or extrahepatic biliary dilation. Pancreas: No  focal mass lesion. No dilatation of the main duct. No intraparenchymal cyst. No peripancreatic edema. Spleen: No splenomegaly. No focal mass lesion. Adrenals/Urinary Tract: No adrenal nodule or mass. 5 mm hypoenhancing lesion posterior interpolar right kidney similar to CT scan from 08/27/2015. Left kidney unremarkable No evidence for hydroureter. The urinary bladder appears normal for the degree of distention. Stomach/Bowel: Small to moderate hiatal hernia. Duodenum is normally positioned as is the ligament of Treitz. No small bowel wall thickening. No small bowel dilatation. The terminal ileum is normal. The appendix is normal. 10 cm segment of wall thickening noted mid sigmoid colon with pericolonic edema/inflammation. Overall imaging features are very similar to 05/18/2017 and similar but slightly less prominent findings were present on the 08/27/2015 exam. 15 mm focus of soft tissue attenuation adjacent to the colon (image 74 series 2) is similar to prior and could be a small fluid collection or inflammatory change. 2.5 cm area of more focal wall thickening is seen on image 73 of series 2. Vascular/Lymphatic: There is abdominal aortic atherosclerosis without aneurysm. There is no gastrohepatic or hepatoduodenal ligament lymphadenopathy. No intraperitoneal or retroperitoneal lymphadenopathy. No pelvic sidewall lymphadenopathy. Reproductive: The uterus has normal CT imaging appearance. There is no adnexal mass. Other: No intraperitoneal free fluid. Musculoskeletal: Bone windows reveal no worrisome lytic or sclerotic osseous lesions. IMPRESSION: 1. Persistent wall thickening involving a 10 cm segment of sigmoid colon with pericolonic edema/inflammation similar to the study from 5 days ago. Similar but less prominent appearance noted on a study from 18 months ago. Imaging features are compatible with diverticulitis without perforation or abscess visible at this time. Patient underwent colonoscopy on 08/26/2016 with  no mass lesion identified. Electronically Signed   By: Misty Stanley M.D.   On: 05/24/2017 09:07        Scheduled Meds: . amLODipine  5 mg Oral Daily  . enoxaparin (LOVENOX) injection  50 mg Subcutaneous Q24H  . feeding supplement  1 Container Oral TID BM  . fluticasone  2 spray Each Nare Daily  . mometasone-formoterol  2 puff Inhalation BID  . oxybutynin  5 mg Oral QHS  . topiramate  50 mg Oral BID   Continuous Infusions: . dextrose 5 % and 0.9% NaCl 10 mL/hr at 05/24/17 1118  . piperacillin-tazobactam (ZOSYN)  IV       LOS: 6 days    Time spent: 35 mins    Irine Seal, MD [facility] Pager [phone]  If 7PM-7AM, please contact night-coverage [email or web address] Password [email or web address] 05/24/2017, 1:49 PM

## 2017-05-25 LAB — BASIC METABOLIC PANEL
Anion gap: 5 (ref 5–15)
BUN: 8 mg/dL (ref 6–20)
CALCIUM: 8.4 mg/dL — AB (ref 8.9–10.3)
CHLORIDE: 112 mmol/L — AB (ref 101–111)
CO2: 24 mmol/L (ref 22–32)
CREATININE: 1.01 mg/dL — AB (ref 0.44–1.00)
GFR calc Af Amer: >60 mL/min (ref >60)
GFR calc non Af Amer: 60 mL/min — ABNORMAL LOW (ref >60)
GLUCOSE: 108 mg/dL — AB (ref 65–99)
Potassium: 4 mmol/L (ref 3.5–5.1)
Sodium: 141 mmol/L (ref 135–145)

## 2017-05-25 LAB — CBC WITH DIFFERENTIAL/PLATELET
Basophils Absolute: 0 10*3/uL (ref 0.0–0.1)
Basophils Relative: 0 %
Eosinophils Absolute: 0.2 10*3/uL (ref 0.0–0.7)
Eosinophils Relative: 3 %
HEMATOCRIT: 38.6 % (ref 36.0–46.0)
HEMOGLOBIN: 12.7 g/dL (ref 12.0–15.0)
LYMPHS ABS: 1.8 10*3/uL (ref 0.7–4.0)
LYMPHS PCT: 24 %
MCH: 33.2 pg (ref 26.0–34.0)
MCHC: 32.9 g/dL (ref 30.0–36.0)
MCV: 101 fL — AB (ref 78.0–100.0)
MONO ABS: 0.7 10*3/uL (ref 0.1–1.0)
MONOS PCT: 9 %
NEUTROS ABS: 4.8 10*3/uL (ref 1.7–7.7)
Neutrophils Relative %: 64 %
Platelets: 296 10*3/uL (ref 150–400)
RBC: 3.82 MIL/uL — ABNORMAL LOW (ref 3.87–5.11)
RDW: 14.1 % (ref 11.5–15.5)
WBC: 7.4 10*3/uL (ref 4.0–10.5)

## 2017-05-25 MED ORDER — SODIUM CHLORIDE 0.9 % IV SOLN
INTRAVENOUS | Status: DC
  Administered 2017-05-25 – 2017-05-27 (×4): via INTRAVENOUS

## 2017-05-25 NOTE — Progress Notes (Signed)
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 Patient initially noted lower quadrant pain that started approximately 1 week prior to this hospital admission. Symptoms were made worse with food, sometimes relieved with passing flatus. Patient was prescribed oral Cipro and Flagyl by her PCP for which she started course on 05/15/2017. Symptoms did not improved, in fact got worse. Patient subsequently presented to the emergency department for further evaluation.  ED Course:In the emergency department, patient underwent abdominal CT with findings of severe diverticulitis involving the sigmoid colon which spans over 12.5 cm. No extraluminal drainable abscess or free intraperitoneal air is noted. She was started on IV Cipro and Flagyl in the emergency department. Of note, patient was noted to have no leukocytosis and noted to be afebrile. Hospitalist service consulted for consideration for admission.     Assessment & Plan:   Principal Problem:   Diverticulitis large intestine Active Problems:   Essential hypertension   OSA (obstructive sleep apnea)   Chronic respiratory failure with hypoxia (HCC)   Morbid (severe) obesity due to excess calories (HCC)   Symptomatic cholelithiasis   LFT elevation  #1 acute severe diverticulitis Patient presented with lower abdominal pain CT abdomen and pelvis showing acute severe diverticulitis of the sigmoid colon over 12.5 cm with possible abscess in the wall of inflamed sigmoid colon but no extraluminal drainable abscess noted.  Patient currently  afebrile.  Normal white count.  Patient states abdominal pain when she tried regular food yesterday.  Some nausea.  No emesis.  Repeat CT abdomen and pelvis showing no abscess or complications.  Persistent wall thickening involving 10 cm segment of the sigmoid colon with peri-colonic edema/inflammation similar to prior study.  Due to no significant change seen on CT, IV Flagyl and ciprofloxacin have been changed to IV Zosynper GI.  Patient did not tolerate regular diet too well yesterday.   Patient with multiple episodes of acute diverticulitis this year approximately 4-5 episodes.  GI has been consulted and are following the patient and I appreciate the input and recommendations.  Continue IV fluids.  Continue supportive care. GI following and appreciate their input and recommendations.    2.  Hypertension Continue Norvasc. Continue to hold ARB and Toprol-XL.  Follow.    3.  Chronic hypoxemic respiratory failure on home O2 Stable.  Continue O2.  Follow.  4.  History of obstructive sleep apnea CPAP nightly.  5.  Morbid obesity  6.  Transaminitis Improved.  Acute hepatitis panel negative. follow.   DVT prophylaxis: Lovenox Code Status: Full Family Communication: Updated patient.  No family at bedside. Disposition Plan: Home once acute flare has improved and patient tolerating oral intake and per GI.   Consultants:   Gastroenterology: Dr. Henrene Pastor 05/20/2017  Procedures:   CT abdomen and pelvis 05/18/2017, 05/23/2017  Antimicrobials:   IV ciprofloxacin 05/18/2017>>>>>> 05/24/2017  IV Flagyl 05/18/2017>>>>> 05/24/2017  IV Zosyn 05/24/2017   Subjective: Patient in bed on the phone.  Patient states tried regular food and had worsening abdominal pain and as such as gone back to liquids.  Patient hesitant to eat at this time.  No emesis.  No chest pain.  No shortness of breath.   Objective: Vitals:   05/24/17 2100 05/25/17 0550 05/25/17 0953 05/25/17 1407  BP: 111/67 118/69  133/76 134/75  Pulse: 75 (!) 52  66  Resp: 18 18  18   Temp: 98.1 F (36.7 C) 98.3 F (36.8 C)  98 F (36.7 C)  TempSrc: Oral Oral  Oral  SpO2: 94% 98%  95%  Weight:      Height:        Intake/Output Summary (Last 24 hours) at 05/25/2017 1742 Last data filed at 05/25/2017 1407 Gross per 24 hour  Intake 550 ml  Output 1600 ml  Net -1050 ml   Filed Weights   05/18/17 1202  Weight: 102.5 kg (226 lb)    Examination:  General exam: NAD. Respiratory system: Clear to auscultation.  No wheezes, no crackles, no rhonchi.  Respiratory effort normal. Cardiovascular system: Regular rate and rhythm no murmurs rubs or gallops.  No pedal edema.  Gastrointestinal system: Abdomen is soft, nondistended, tender to palpation in the suprapubic region L > R, positive bowel sounds.   Central nervous system: Alert and oriented. No focal neurological deficits. Extremities: Symmetric 5 x 5 power. Skin: No rashes, lesions or ulcers Psychiatry: Judgement and insight appear normal. Mood & affect appropriate.     Data Reviewed: I have personally reviewed following labs and imaging studies  CBC: Recent Labs  Lab 05/21/17 0437 05/22/17 0508 05/23/17 0917 05/24/17 0355 05/25/17 0420  WBC 5.5 5.2 4.7 5.5 7.4  NEUTROABS  --  3.2 2.6 3.1 4.8  HGB 12.7 11.3* 12.6 12.4 12.7  HCT 39.7 35.3* 39.3 38.0 38.6  MCV 102.3* 101.1* 101.3* 100.5* 101.0*  PLT 241 265 298 291 585   Basic Metabolic Panel: Recent Labs  Lab 05/21/17 0437 05/22/17 0508 05/23/17 0917 05/24/17 0355 05/25/17 0420  NA 140 142 141 140 141  K 4.1 3.9 3.4* 3.8 4.0  CL 109 110 110 111 112*  CO2 26 26 25 24 24   GLUCOSE 100* 103* 79 106* 108*  BUN 6 6 <5* <5* 8  CREATININE 0.86 0.93 0.96 0.94 1.01*  CALCIUM 8.3* 8.4* 8.5* 8.0* 8.4*  MG 1.8 2.2 1.8 2.4  --    GFR: Estimated Creatinine Clearance: 67.3 mL/min (A) (by C-G formula based on SCr of 1.01 mg/dL (H)). Liver Function Tests: Recent Labs  Lab 05/19/17 0109  05/20/17 0530 05/24/17 0355  AST 116* 34 47*  ALT 55* 42 35  ALKPHOS 80 70 66  BILITOT 0.9 0.6 0.6  PROT 6.3* 5.7* 6.2*  ALBUMIN 3.1* 2.8* 3.0*   No results for input(s): LIPASE, AMYLASE in the last 168 hours. No results for input(s): AMMONIA in the last 168 hours. Coagulation Profile: No results for input(s): INR, PROTIME in the last 168 hours. Cardiac Enzymes: Recent Labs  Lab 05/18/17 1853 05/19/17 0109 05/19/17 0626  TROPONINI <0.03 <0.03 <0.03   BNP (last 3 results) No results for input(s): PROBNP in the last 8760 hours. HbA1C: No results for input(s): HGBA1C in the last 72 hours. CBG: No results for input(s): GLUCAP in the last 168 hours. Lipid Profile: No results for input(s): CHOL, HDL, LDLCALC, TRIG, CHOLHDL, LDLDIRECT in the last 72 hours. Thyroid Function Tests: No results for input(s): TSH, T4TOTAL, FREET4, T3FREE, THYROIDAB in the last 72 hours. Anemia Panel: No results for input(s): VITAMINB12, FOLATE, FERRITIN, TIBC, IRON, RETICCTPCT in the last 72 hours. Sepsis Labs: No results for input(s): PROCALCITON, LATICACIDVEN in the last 168 hours.  No  results found for this or any previous visit (from the past 240 hour(s)).       Radiology Studies: No results found.      Scheduled Meds: . amLODipine  5 mg Oral Daily  . enoxaparin (LOVENOX) injection  50 mg Subcutaneous Q24H  . feeding supplement  1 Container Oral TID BM  . fluticasone  2 spray Each Nare Daily  . mometasone-formoterol  2 puff Inhalation BID  . oxybutynin  5 mg Oral QHS  . topiramate  50 mg Oral BID   Continuous Infusions: . piperacillin-tazobactam (ZOSYN)  IV 3.375 g (05/25/17 1407)     LOS: 7 days    Time spent: 52 mins    Irine Seal, MD [facility] Pager [phone]  If 7PM-7AM, please contact night-coverage [email or web address] Password [email or web address] 05/25/2017, 5:42 PM

## 2017-05-26 DIAGNOSIS — R1084 Generalized abdominal pain: Secondary | ICD-10-CM

## 2017-05-26 DIAGNOSIS — R935 Abnormal findings on diagnostic imaging of other abdominal regions, including retroperitoneum: Secondary | ICD-10-CM

## 2017-05-26 LAB — CBC
HEMATOCRIT: 38.5 % (ref 36.0–46.0)
HEMOGLOBIN: 12.4 g/dL (ref 12.0–15.0)
MCH: 32.5 pg (ref 26.0–34.0)
MCHC: 32.2 g/dL (ref 30.0–36.0)
MCV: 100.8 fL — AB (ref 78.0–100.0)
Platelets: 314 10*3/uL (ref 150–400)
RBC: 3.82 MIL/uL — ABNORMAL LOW (ref 3.87–5.11)
RDW: 14.1 % (ref 11.5–15.5)
WBC: 5.3 10*3/uL (ref 4.0–10.5)

## 2017-05-26 LAB — BASIC METABOLIC PANEL
Anion gap: 6 (ref 5–15)
BUN: 12 mg/dL (ref 6–20)
CHLORIDE: 109 mmol/L (ref 101–111)
CO2: 27 mmol/L (ref 22–32)
Calcium: 8.4 mg/dL — ABNORMAL LOW (ref 8.9–10.3)
Creatinine, Ser: 1.24 mg/dL — ABNORMAL HIGH (ref 0.44–1.00)
GFR calc non Af Amer: 47 mL/min — ABNORMAL LOW (ref >60)
GFR, EST AFRICAN AMERICAN: 54 mL/min — AB (ref >60)
Glucose, Bld: 96 mg/dL (ref 65–99)
POTASSIUM: 3.8 mmol/L (ref 3.5–5.1)
SODIUM: 142 mmol/L (ref 135–145)

## 2017-05-26 LAB — MAGNESIUM: Magnesium: 1.8 mg/dL (ref 1.7–2.4)

## 2017-05-26 MED ORDER — SIMETHICONE 80 MG PO CHEW
160.0000 mg | CHEWABLE_TABLET | Freq: Four times a day (QID) | ORAL | Status: DC
  Administered 2017-05-26 – 2017-05-27 (×7): 160 mg via ORAL
  Filled 2017-05-26 (×7): qty 2

## 2017-05-26 MED ORDER — GI COCKTAIL ~~LOC~~
30.0000 mL | Freq: Three times a day (TID) | ORAL | Status: DC | PRN
  Administered 2017-05-26: 30 mL via ORAL
  Filled 2017-05-26 (×2): qty 30

## 2017-05-26 MED ORDER — PANTOPRAZOLE SODIUM 40 MG PO TBEC
40.0000 mg | DELAYED_RELEASE_TABLET | Freq: Every day | ORAL | Status: DC
  Administered 2017-05-26 – 2017-05-28 (×3): 40 mg via ORAL
  Filled 2017-05-26 (×3): qty 1

## 2017-05-26 NOTE — Progress Notes (Signed)
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 Patient initially noted lower quadrant pain that started approximately 1 week prior to this hospital admission. Symptoms were made worse with food, sometimes relieved with passing flatus. Patient was prescribed oral Cipro and Flagyl by her PCP for which she started course on 05/15/2017. Symptoms did not improved, in fact got worse. Patient subsequently presented to the emergency department for further evaluation.  ED Course:In the emergency department, patient underwent abdominal CT with findings of severe diverticulitis involving the sigmoid colon which spans over 12.5 cm. No extraluminal drainable abscess or free intraperitoneal air is noted. She was started on IV Cipro and Flagyl in the emergency department. Of note, patient was noted to have no leukocytosis and noted to be afebrile. Hospitalist service consulted for consideration for admission.     Assessment & Plan:   Principal Problem:   Diverticulitis large intestine Active Problems:   Essential hypertension   OSA (obstructive sleep apnea)   Chronic respiratory failure with hypoxia (HCC)   Morbid (severe) obesity due to excess calories (HCC)   Symptomatic cholelithiasis   LFT elevation  #1 acute severe diverticulitis Patient presented with lower abdominal pain CT abdomen and pelvis showed acute severe diverticulitis of the sigmoid colon over 12.5 cm with possible abscess in the wall of inflamed sigmoid colon but no extraluminal drainable abscess noted.  Patient currently  afebrile.  Normal white count.  Patient states abdominal pain when she tried regular food.  Some nausea.  No emesis.  Repeat CT abdomen and pelvis showed no abscess or complications.  Persistent wall thickening involving 10 cm segment of the sigmoid colon with peri-colonic edema/inflammation similar to prior study.  Due to no significant change seen on CT, IV Flagyl and ciprofloxacin have been changed to IV Zosyn per GI.  Patient did not tolerate regular diet too well.  Place patient on scheduled simethicone as well as PPI.  Monitor with IV Zosyn.  If improving clinically we will transition to oral Augmentin to complete a course of antibiotic treatment.   Patient with multiple episodes of acute diverticulitis this year approximately 4-5 episodes.  GI has been consulted and are following the patient and I appreciate the input and recommendations.  Continue IV fluids.  Continue supportive care.  Once patient is discharged will need to follow-up with her gastroenterologist, Dr. Havery Moros within 1-2 weeks post discharge.    2.  Hypertension Continue Norvasc.  ARB and Toprol-XL on hold.   3.  Chronic hypoxemic respiratory failure on home O2 Stable.  Continue home O2.  Follow.  4.  History of obstructive sleep apnea CPAP nightly.  5.  Morbid obesity  6.  Transaminitis Improved.  Acute hepatitis panel negative. follow.   DVT prophylaxis: Lovenox Code Status: Full Family Communication: Updated patient.  No family at bedside. Disposition Plan: Home once acute flare has improved and patient tolerating oral intake hopefully in the next 24-48 hours.    Consultants:   Gastroenterology: Dr. Henrene Pastor 05/20/2017  Procedures:   CT abdomen and pelvis 05/18/2017, 05/23/2017  Antimicrobials:   IV ciprofloxacin 05/18/2017>>>>>> 05/24/2017  IV Flagyl 05/18/2017>>>>> 05/24/2017  IV Zosyn 05/24/2017   Subjective: Patient still complaining of lower abdominal pain.  States pain comes and goes and feels  like a bloated feeling.  Patient also with complaints of reflux.  Patient states when she tried regular diet/soft diet last night she had significant abdominal pain and as such is holding off on eating lunch at this time.  No shortness of breath.  No chest pain.    Objective: Vitals:   05/25/17 2109 05/25/17 2127 05/26/17 0634 05/26/17 1259  BP:  132/67 126/72 111/68  Pulse: 71 75 (!) 54 62  Resp: 17 18 18 18   Temp:  99.4 F (37.4 C) 98.8 F (37.1 C) 98.9 F (37.2 C)  TempSrc:  Oral Oral Oral  SpO2: 94% 96% 94% 93%  Weight:      Height:        Intake/Output Summary (Last 24 hours) at 05/26/2017 1310 Last data filed at 05/26/2017 1025 Gross per 24 hour  Intake 2046.25 ml  Output 2300 ml  Net -253.75 ml   Filed Weights   05/18/17 1202  Weight: 102.5 kg (226 lb)    Examination:  General exam: NAD. Respiratory system: Clear to auscultation bilaterally.  No wheezes, no crackles, no rhonchi. Cardiovascular system: Regular rate and rhythm no murmurs rubs or gallops.  No pedal edema.  Gastrointestinal system: Abdomen is soft, nondistended, tender to palpation in left lower quadrant greater than the right lower quadrant.  Positive bowel sounds.   Central nervous system: Alert and oriented. No focal neurological deficits. Extremities: Symmetric 5 x 5 power. Skin: No rashes, lesions or ulcers Psychiatry: Judgement and insight appear normal. Mood & affect appropriate.     Data Reviewed: I have personally reviewed following labs and imaging studies  CBC: Recent Labs  Lab 05/22/17 0508 05/23/17 0917 05/24/17 0355 05/25/17 0420 05/26/17 0505  WBC 5.2 4.7 5.5 7.4 5.3  NEUTROABS 3.2 2.6 3.1 4.8  --   HGB 11.3* 12.6 12.4 12.7 12.4  HCT 35.3* 39.3 38.0 38.6 38.5  MCV 101.1* 101.3* 100.5* 101.0* 100.8*  PLT 265 298 291 296 762   Basic Metabolic Panel: Recent Labs  Lab 05/21/17 0437 05/22/17 0508 05/23/17 0917 05/24/17 0355 05/25/17 0420 05/26/17 0505  NA 140 142 141  140 141 142  K 4.1 3.9 3.4* 3.8 4.0 3.8  CL 109 110 110 111 112* 109  CO2 26 26 25 24 24 27   GLUCOSE 100* 103* 79 106* 108* 96  BUN 6 6 <5* <5* 8 12  CREATININE 0.86 0.93 0.96 0.94 1.01* 1.24*  CALCIUM 8.3* 8.4* 8.5* 8.0* 8.4* 8.4*  MG 1.8 2.2 1.8 2.4  --  1.8   GFR: Estimated Creatinine Clearance: 54.8 mL/min (A) (by C-G formula based on SCr of 1.24 mg/dL (H)). Liver Function Tests: Recent Labs  Lab 05/20/17 0530 05/24/17 0355  AST 34 47*  ALT 42 35  ALKPHOS 70 66  BILITOT 0.6 0.6  PROT 5.7* 6.2*  ALBUMIN 2.8* 3.0*   No results for input(s): LIPASE, AMYLASE in the last 168 hours. No results for input(s): AMMONIA in the last 168 hours. Coagulation Profile: No results for input(s): INR, PROTIME in the last 168 hours. Cardiac Enzymes: No results for input(s): CKTOTAL, CKMB, CKMBINDEX, TROPONINI in the last 168 hours. BNP (last 3 results) No results for input(s): PROBNP in the last 8760 hours. HbA1C: No results for input(s): HGBA1C in the last 72 hours. CBG: No results for input(s): GLUCAP in the last 168 hours. Lipid Profile: No results  for input(s): CHOL, HDL, LDLCALC, TRIG, CHOLHDL, LDLDIRECT in the last 72 hours. Thyroid Function Tests: No results for input(s): TSH, T4TOTAL, FREET4, T3FREE, THYROIDAB in the last 72 hours. Anemia Panel: No results for input(s): VITAMINB12, FOLATE, FERRITIN, TIBC, IRON, RETICCTPCT in the last 72 hours. Sepsis Labs: No results for input(s): PROCALCITON, LATICACIDVEN in the last 168 hours.  No results found for this or any previous visit (from the past 240 hour(s)).       Radiology Studies: No results found.      Scheduled Meds: . amLODipine  5 mg Oral Daily  . enoxaparin (LOVENOX) injection  50 mg Subcutaneous Q24H  . feeding supplement  1 Container Oral TID BM  . fluticasone  2 spray Each Nare Daily  . mometasone-formoterol  2 puff Inhalation BID  . oxybutynin  5 mg Oral QHS  . pantoprazole  40 mg Oral Q0600  .  simethicone  160 mg Oral QID  . topiramate  50 mg Oral BID   Continuous Infusions: . sodium chloride 100 mL/hr at 05/26/17 1019  . piperacillin-tazobactam (ZOSYN)  IV 3.375 g (05/26/17 0700)     LOS: 8 days    Time spent: 72 mins    Irine Seal, MD [facility] Pager [phone]  If 7PM-7AM, please contact night-coverage [email or web address] Password [email or web address] 05/26/2017, 1:10 PM

## 2017-05-27 DIAGNOSIS — J9611 Chronic respiratory failure with hypoxia: Secondary | ICD-10-CM

## 2017-05-27 DIAGNOSIS — I10 Essential (primary) hypertension: Secondary | ICD-10-CM

## 2017-05-27 LAB — CBC
HEMATOCRIT: 38.4 % (ref 36.0–46.0)
HEMOGLOBIN: 12.4 g/dL (ref 12.0–15.0)
MCH: 32.5 pg (ref 26.0–34.0)
MCHC: 32.3 g/dL (ref 30.0–36.0)
MCV: 100.8 fL — ABNORMAL HIGH (ref 78.0–100.0)
Platelets: 315 10*3/uL (ref 150–400)
RBC: 3.81 MIL/uL — ABNORMAL LOW (ref 3.87–5.11)
RDW: 14.4 % (ref 11.5–15.5)
WBC: 7.3 10*3/uL (ref 4.0–10.5)

## 2017-05-27 LAB — BASIC METABOLIC PANEL
ANION GAP: 6 (ref 5–15)
BUN: 9 mg/dL (ref 6–20)
CALCIUM: 8.2 mg/dL — AB (ref 8.9–10.3)
CHLORIDE: 110 mmol/L (ref 101–111)
CO2: 23 mmol/L (ref 22–32)
Creatinine, Ser: 1.07 mg/dL — ABNORMAL HIGH (ref 0.44–1.00)
GFR calc non Af Amer: 56 mL/min — ABNORMAL LOW (ref >60)
Glucose, Bld: 101 mg/dL — ABNORMAL HIGH (ref 65–99)
Potassium: 3.9 mmol/L (ref 3.5–5.1)
SODIUM: 139 mmol/L (ref 135–145)

## 2017-05-27 MED ORDER — OXYCODONE HCL 5 MG PO TABS
5.0000 mg | ORAL_TABLET | ORAL | Status: DC | PRN
  Administered 2017-05-27 – 2017-05-28 (×4): 5 mg via ORAL
  Filled 2017-05-27 (×4): qty 1

## 2017-05-27 MED ORDER — HYOSCYAMINE SULFATE ER 0.375 MG PO TB12
0.3750 mg | ORAL_TABLET | Freq: Two times a day (BID) | ORAL | Status: DC
  Administered 2017-05-27 (×2): 0.375 mg via ORAL
  Filled 2017-05-27 (×3): qty 1

## 2017-05-27 MED ORDER — AMOXICILLIN-POT CLAVULANATE 875-125 MG PO TABS
1.0000 | ORAL_TABLET | Freq: Two times a day (BID) | ORAL | Status: DC
  Administered 2017-05-27 – 2017-05-28 (×3): 1 via ORAL
  Filled 2017-05-27 (×3): qty 1

## 2017-05-27 MED ORDER — PROMETHAZINE HCL 25 MG/ML IJ SOLN: 12.5000 mg | Freq: Four times a day (QID) | INTRAMUSCULAR | Status: DC | PRN

## 2017-05-27 NOTE — Progress Notes (Signed)
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 Patient initially noted lower quadrant pain that started approximately 1 week prior to this hospital admission. Symptoms were made worse with food, sometimes relieved with passing flatus. Patient was prescribed oral Cipro and Flagyl by her PCP for which she started course on 05/15/2017. Symptoms did not improved, in fact got worse. Patient subsequently presented to the emergency department for further evaluation. ED Course:In the emergency department, patient underwent abdominal CT with findings of severe diverticulitis involving the sigmoid colon which spans over 12.5 cm. started on antibiotics, followed by GI as well, has been very slow to improve, had a repeat CT few days ago which showed mild improvement, no abscess or other complications.   Assessment & Plan:  #1. Recurrent severe diverticulitis -Admitted with severe lower abdominal pain and CT on admission 10 days ago noted severe acute diverticulitis of the sigmoid colon involving 12.5 cm and possible abscess in the wall but no extraluminal drainable abscess. She was treated with IV antibiotics and had been very slow to improve, also seen and followed by GI . She had a repeat CT few days back which showed persistent wall thickening involving 10th centimeters of the sigmoid colon, her Cipro and Flagyl were changed to IV Zosyn. -Slowly improving clinically however continues to have moderate abdominal symptoms and cramping with diarrhea, suspect some of this could be antibiotic mediated as  well -Changed to oral Augmentin today -Add Hyoscamine twice a day -Continue current diet, changed to oral narcotics -Ambulate in the hall, home tomorrow if stable -She plans to follow-up with GI Dr. Havery [name] in 1-2 weeks and then Dr. Ninfa [name] with general surgery for hemicolectomy.  2.  Hypertension Continue Norvasc.  ARB and Toprol-XL on hold.   3.  Chronic hypoxemic respiratory failure on home O2 Stable.  Continue home O2.  Follow.  4.  History of obstructive sleep apnea -Continue CPAP nightly.  5.  Morbid obesity  6.  Transaminitis Improved.   DVT prophylaxis: Lovenox Code Status: Full Family Communication: Updated patient.  No family at bedside. Disposition Plan: Home tomorrow if stable Consultants:   Gastroenterology: Dr. Henrene [name] 05/20/2017  Procedures:   CT abdomen and pelvis 05/18/2017, 05/23/2017  Antimicrobials:   IV ciprofloxacin 05/18/2017>>>>>> 05/24/2017  IV Flagyl 05/18/2017>>>>> 05/24/2017  IV Zosyn 05/24/2017   Subjective: -Continues to have some lower abdominal cramps and diarrhea  Objective: Vitals:   05/26/17 0634 05/26/17 1259 05/26/17 2206 05/27/17 0559  BP: 126/72 111/68 130/78 117/68  Pulse: (!) 54 62 68 71  Resp: 18 18 19 18   Temp: 98.8 F (37.1 C) 98.9 F (37.2 C) 98.3 F (36.8 C) 98.3 F (36.8 C)  TempSrc: Oral Oral Oral Oral  SpO2: 94% 93% 94% 93%  Weight:      Height:        Intake/Output Summary (Last 24 hours) at 05/27/2017 1324 Last data filed at 05/27/2017 1000 Gross per 24 hour  Intake 2985.42 ml  Output 2570 ml  Net 415.42 ml   Filed Weights   05/18/17 1202  Weight: 102.5 kg (226 lb)    Examination:  Gen: Awake,  Alert, Oriented X 3,  HEENT: PERRLA, Neck supple, no JVD Lungs: Good air movement bilaterally, CTAB CVS: RRR,No Gallops,Rubs or new Murmurs Abd: soft, obese, mild lower quadrant tenderness, non distended, BS present Extremities: No Cyanosis, Clubbing or edema Skin: no new rashes  Data  Reviewed: I have personally reviewed following labs and imaging studies  CBC: Recent Labs  Lab 05/22/17 0508 05/23/17 0917 05/24/17 0355 05/25/17 0420 05/26/17 0505 05/27/17 0558  WBC 5.2 4.7 5.5 7.4 5.3 7.3  NEUTROABS 3.2 2.6 3.1 4.8  --   --   HGB 11.3* 12.6 12.4 12.7 12.4 12.4  HCT 35.3* 39.3 38.0 38.6 38.5 38.4  MCV 101.1* 101.3* 100.5* 101.0* 100.8* 100.8*  PLT 265 298 291 296 314 638   Basic Metabolic Panel: Recent Labs  Lab 05/21/17 0437 05/22/17 0508 05/23/17 0917 05/24/17 0355 05/25/17 0420 05/26/17 0505 05/27/17 0558  NA 140 142 141 140 141 142 139  K 4.1 3.9 3.4* 3.8 4.0 3.8 3.9  CL 109 110 110 111 112* 109 110  CO2 26 26 25 24 24 27 23   GLUCOSE 100* 103* 79 106* 108* 96 101*  BUN 6 6 <5* <5* 8 12 9   CREATININE 0.86 0.93 0.96 0.94 1.01* 1.24* 1.07*  CALCIUM 8.3* 8.4* 8.5* 8.0* 8.4* 8.4* 8.2*  MG 1.8 2.2 1.8 2.4  --  1.8  --    GFR: Estimated Creatinine Clearance: 63.5 mL/min (A) (by C-G formula based on SCr of 1.07 mg/dL (H)). Liver Function Tests: Recent Labs  Lab 05/24/17 0355  AST 47*  ALT 35  ALKPHOS 66  BILITOT 0.6  PROT 6.2*  ALBUMIN 3.0*   No results for input(s): LIPASE, AMYLASE in the last 168 hours. No results for input(s): AMMONIA in the last 168 hours. Coagulation Profile: No results for input(s): INR, PROTIME in the last 168 hours. Cardiac Enzymes: No results for input(s): CKTOTAL, CKMB, CKMBINDEX, TROPONINI in the last 168 hours. BNP (last 3 results) No results for input(s): PROBNP in the last 8760 hours. HbA1C: No results for input(s): HGBA1C in the last 72 hours. CBG: No results for input(s): GLUCAP in the last 168 hours. Lipid Profile: No results for input(s): CHOL, HDL, LDLCALC, TRIG, CHOLHDL, LDLDIRECT in the last 72 hours. Thyroid Function Tests: No results for input(s): TSH, T4TOTAL, FREET4, T3FREE, THYROIDAB in the last 72 hours. Anemia Panel: No results for input(s): VITAMINB12, FOLATE, FERRITIN, TIBC, IRON,  RETICCTPCT in the last 72 hours. Sepsis Labs: No results for input(s): PROCALCITON, LATICACIDVEN in the last 168 hours.  No results found for this or any previous visit (from the past 240 hour(s)).       Radiology Studies: No results found.      Scheduled Meds: . amLODipine  5 mg Oral Daily  . amoxicillin-clavulanate  1 tablet Oral Q12H  . enoxaparin (LOVENOX) injection  50 mg Subcutaneous Q24H  . feeding supplement  1 Container Oral TID BM  . fluticasone  2 spray Each Nare Daily  . hyoscyamine  0.375 mg Oral Q12H  . mometasone-formoterol  2 puff Inhalation BID  . oxybutynin  5 mg Oral QHS  . pantoprazole  40 mg Oral Q0600  . simethicone  160 mg Oral QID  . topiramate  50 mg Oral BID   Continuous Infusions: . sodium chloride 10 mL/hr at 05/27/17 1034     LOS: 9 days    Time spent: 35 mins    Domenic Polite, MD [facility] Page via [email or web address], password [email or web address]  If 7PM-7AM,  please contact night-coverage [email or web address] Password [email or web address] 05/27/2017, 1:24 PM

## 2017-05-27 NOTE — Progress Notes (Signed)
Message sent to K. Schorr, NP to report patient complaints. No redness, no swelling noted on assessment.  Patient did not report any incidents but continues to focus on her left foot.   05/27/17 2208  Musculoskeletal  Musculoskeletal (WDL) X  Assistive Device BSC (Independent.  Did not report any incidents.)  Musculoskeletal Details  Left Lower Leg Full movement (Patient c/o pain and swelling, difficulty bearing weight)

## 2017-05-28 LAB — BASIC METABOLIC PANEL
ANION GAP: 8 (ref 5–15)
BUN: 6 mg/dL (ref 6–20)
CHLORIDE: 108 mmol/L (ref 101–111)
CO2: 23 mmol/L (ref 22–32)
Calcium: 8.5 mg/dL — ABNORMAL LOW (ref 8.9–10.3)
Creatinine, Ser: 0.84 mg/dL (ref 0.44–1.00)
GFR calc Af Amer: >60 mL/min (ref >60)
GLUCOSE: 96 mg/dL (ref 65–99)
POTASSIUM: 4 mmol/L (ref 3.5–5.1)
Sodium: 139 mmol/L (ref 135–145)

## 2017-05-28 LAB — CBC
HEMATOCRIT: 41.3 % (ref 36.0–46.0)
HEMOGLOBIN: 13.3 g/dL (ref 12.0–15.0)
MCH: 32.7 pg (ref 26.0–34.0)
MCHC: 32.2 g/dL (ref 30.0–36.0)
MCV: 101.5 fL — AB (ref 78.0–100.0)
Platelets: 283 10*3/uL (ref 150–400)
RBC: 4.07 MIL/uL (ref 3.87–5.11)
RDW: 14.4 % (ref 11.5–15.5)
WBC: 7.5 10*3/uL (ref 4.0–10.5)

## 2017-05-28 MED ORDER — OXYCODONE HCL 5 MG PO TABS: 5.0000 mg | ORAL_TABLET | ORAL | 0 refills | Status: DC | PRN

## 2017-05-28 MED ORDER — HYOSCYAMINE SULFATE ER 0.375 MG PO TB12: 0.3750 mg | ORAL_TABLET | Freq: Two times a day (BID) | ORAL | 0 refills | Status: DC | PRN

## 2017-05-28 MED ORDER — FUROSEMIDE 40 MG PO TABS: 20.0000 mg | ORAL_TABLET | Freq: Every day | ORAL | Status: DC

## 2017-05-28 MED ORDER — AMOXICILLIN-POT CLAVULANATE 875-125 MG PO TABS: 1.0000 | ORAL_TABLET | Freq: Two times a day (BID) | ORAL | 0 refills | Status: DC

## 2017-05-28 NOTE — Discharge Summary (Signed)
Physician Discharge Summary  Ann [name]:[id number] DOB: August 26, 1955 DOA: 05/18/2017  PCP: Berkley Harvey, NP  Admit date: 05/18/2017 Discharge date: 05/28/2017  Time spent: 45 minutes  Recommendations for Outpatient Follow-up:  1. GI Dr.Armbruster in 2weeks 2. General Surgery Dr.Blackman in 37month   Discharge Diagnoses:  Principal Problem:   Recurrent Diverticulitis of colon Active Problems:   Essential hypertension   OSA (obstructive sleep apnea)   Chronic respiratory failure with hypoxia (HCC)   Morbid (severe) obesity due to excess calories (HCC)   Symptomatic cholelithiasis   LFT elevation   Abnormal CT of the abdomen   Generalized abdominal pain   Discharge Condition: stable  Diet recommendation: soft diet as tolerated  Filed Weights   05/18/17 1202  Weight: 102.5 kg (226 lb)    History of present illness:  Ann [name] a 61 y.o.femalewithfemalewith medical history significant of multiple episodes of diverticulitis as many as 4-5 times already this past year, 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 Patient initially noted lower quadrant pain that started approximately 1 week prior to this hospital admission  Hospital Course:   #1. Recurrent severe diverticulitis -Admitted with severe lower abdominal pain and CT on admission 11 days ago noted severe acute diverticulitis of the sigmoid colon involving 12.5 cm and possible abscess in the wall but no extraluminal drainable abscess. She was treated with IV antibiotics and had been very slow to improve, also seen and followed by [facility] . -She had a repeat CT few days back which showed persistent wall thickening involving 10th centimeters of the sigmoid colon, her Cipro and Flagyl were changed to IV Zosyn. -Now finally slowly improving , having some intermittent cramping, which  improved with hyoscamine, she was transitioned to PO Augmentin yesterday and is tolerating a diet and anxious to go home today for thanksgiving -She will follow-up with GI Dr. Havery Moros in 1-2 weeks and then Dr. Ninfa [name] with general surgery for hemicolectomy.  2.  Hypertension Continue Norvasc and Toprol, ARb stopped   3.  Chronic hypoxemic respiratory failure on home O2 -Stable, continue home O2, nebs PRN  4.  History of obstructive sleep apnea -Continue CPAP nightly.  5.  Morbid obesity -lifestyle modification advised  6.  Transaminitis Improved.   Consultants:   Gastroenterology: Dr. Henrene Pastor   Procedures:   CT abdomen and pelvis 05/18/2017, 05/23/2017  Antimicrobials:   IV ciprofloxacin 05/18/2017>>>>>> 05/24/2017  IV Flagyl 05/18/2017>>>>> 05/24/2017  IV Zosyn 05/24/2017  Discharge Exam: Vitals:   05/27/17 2058 05/28/17 0445  BP: 136/73 (!) 125/59  Pulse: 92 86  Resp: 20 20  Temp: 98.8 F (37.1 C) 98.4 F (36.9 C)  SpO2: 96% 100%    General: AAOx3 Cardiovascular: S1S2/RRR Respiratory: CTAB  Discharge Instructions   Discharge Instructions    Diet - low sodium heart healthy   Complete by:  As directed    Increase activity slowly   Complete by:  As directed      Discharge Medication List as of 05/28/2017  9:53 AM    START taking these medications   Details  amoxicillin-clavulanate (AUGMENTIN) 875-125 MG tablet Take 1 tablet by mouth every 12 (twelve) hours. For 4days, Starting Thu 05/28/2017, Print    hyoscyamine (LEVBID) 0.375 MG 12 hr tablet Take 1 tablet (0.375 mg total) by mouth every 12 (twelve) hours as needed for cramping., Starting Thu 05/28/2017, Print  CONTINUE these medications which have CHANGED   Details  furosemide (LASIX) 40 MG tablet Take 0.5 tablets (20 mg total) by mouth daily., Starting Thu 05/28/2017, No Print    oxyCODONE (ROXICODONE) 5 MG immediate release tablet Take 1 tablet (5 mg total) by mouth every 4  (four) hours as needed for severe pain., Starting Thu 05/28/2017, Print      CONTINUE these medications which have NOT CHANGED   Details  albuterol (PROAIR HFA) 108 (90 Base) MCG/ACT inhaler Inhale 2 puffs into the lungs every 4 (four) hours as needed. For cough wheezing., Starting 08/07/2015, Until Discontinued, Historical Med    albuterol (PROVENTIL) (2.5 MG/3ML) 0.083% nebulizer solution Inhale 3 mLs into the lungs every 6 (six) hours as needed. Wheezing/shortness of breath, Starting 08/07/2015, Until Discontinued, Historical Med    amLODipine (NORVASC) 10 MG tablet Take 10 mg by mouth daily. , Until Discontinued, Historical Med    aspirin EC 81 MG tablet Take 81 mg daily by mouth., Historical Med    atorvastatin (LIPITOR) 10 MG tablet Take 10 mg daily by mouth., Starting Fri 05/15/2017, Historical Med    budesonide-formoterol (SYMBICORT) 160-4.5 MCG/ACT inhaler Inhale 2 puffs 2 (two) times daily into the lungs., Starting Tue 04/21/2017, Until Wed 04/21/2018, Historical Med    chlorpheniramine (CHLOR-TRIMETON) 4 MG tablet Take 1 tablet (4 mg total) by mouth at bedtime., Starting 03/08/2015, Until Discontinued, OTC    clonazePAM (KLONOPIN) 0.5 MG tablet Take 0.5 mg by mouth 2 (two) times daily as needed for anxiety. , Historical Med    fluticasone (FLONASE) 50 MCG/ACT nasal spray Place 2 sprays daily into both nostrils., Starting Tue 08/07/2015, Historical Med    metoprolol succinate (TOPROL-XL) 50 MG 24 hr tablet Take 50 mg by mouth at bedtime. , Starting Wed 08/08/2015, Historical Med    omeprazole (PRILOSEC) 40 MG capsule TAKE 1 CAPSULE(40 MG) BY MOUTH TWICE DAILY, Normal    OXYGEN Inhale into the lungs., Historical Med    polyethylene glycol (MIRALAX / GLYCOLAX) packet Take 17 g by mouth daily as needed for moderate constipation., Starting Fri 01/16/2017, Normal    promethazine (PHENERGAN) 12.5 MG tablet Take 1 tablet (12.5 mg total) by mouth every 6 (six) hours as needed for nausea or  vomiting., Starting Wed 02/11/2017, Normal    topiramate (TOPAMAX) 50 MG tablet Take 50 mg by mouth 2 (two) times daily., Until Discontinued, Historical Med    UNABLE TO FIND Med Name: CPAP, Historical Med    ondansetron (ZOFRAN ODT) 8 MG disintegrating tablet Take 1 tablet (8 mg total) by mouth every 8 (eight) hours as needed for nausea or vomiting., Starting Sun 03/08/2017, Print    oxybutynin (DITROPAN XL) 5 MG 24 hr tablet Take 1 tablet (5 mg total) by mouth at bedtime., Starting Fri 03/20/2017, Print      STOP taking these medications     cholestyramine (QUESTRAN) 4 g packet      ciprofloxacin (CIPRO) 500 MG tablet      losartan (COZAAR) 50 MG tablet      metroNIDAZOLE (FLAGYL) 250 MG tablet      oxyCODONE-acetaminophen (PERCOCET/ROXICET) 5-325 MG tablet      tamsulosin (FLOMAX) 0.4 MG CAPS capsule        Allergies  Allergen Reactions  . Lisinopril Cough  . Doxycycline Diarrhea    Severe headaches  . Metronidazole Other (See Comments)    Severe headaches   Follow-up Information    Berkley Harvey, NP .   Specialty:  Nurse Practitioner Contact information: [place] [address] [phone]        Yetta Flock, MD. Schedule an appointment as soon as possible for a visit in 2 week(s).   Specialty:  Gastroenterology Contact information: [address] [place] [address] [phone]            The results of significant diagnostics from this hospitalization (including imaging, microbiology, ancillary and laboratory) are listed below for reference.    Significant Diagnostic Studies: Ct Abdomen Pelvis W Contrast  Result Date: 05/24/2017 CLINICAL DATA:  Abdominal pain.  Follow-up diverticulitis. EXAM: CT ABDOMEN AND PELVIS WITH CONTRAST TECHNIQUE: Multidetector CT imaging of the abdomen and pelvis was performed using the standard protocol following bolus administration of intravenous contrast. CONTRAST:  <See Chart>  ISOVUE-300 IOPAMIDOL (ISOVUE-300) INJECTION 61% COMPARISON:  05/18/2017 FINDINGS: Lower chest:  Unremarkable. Hepatobiliary: No focal abnormality within the liver parenchyma. Gallbladder surgically absent. No intrahepatic or extrahepatic biliary dilation. Pancreas: No focal mass lesion. No dilatation of the main duct. No intraparenchymal cyst. No peripancreatic edema. Spleen: No splenomegaly. No focal mass lesion. Adrenals/Urinary Tract: No adrenal nodule or mass. 5 mm hypoenhancing lesion posterior interpolar right kidney similar to CT scan from 08/27/2015. Left kidney unremarkable No evidence for hydroureter. The urinary bladder appears normal for the degree of distention. Stomach/Bowel: Small to moderate hiatal hernia. Duodenum is normally positioned as is the ligament of Treitz. No small bowel wall thickening. No small bowel dilatation. The terminal ileum is normal. The appendix is normal. 10 cm segment of wall thickening noted mid sigmoid colon with pericolonic edema/inflammation. Overall imaging features are very similar to 05/18/2017 and similar but slightly less prominent findings were present on the 08/27/2015 exam. 15 mm focus of soft tissue attenuation adjacent to the colon (image 74 series 2) is similar to prior and could be a small fluid collection or inflammatory change. 2.5 cm area of more focal wall thickening is seen on image 73 of series 2. Vascular/Lymphatic: There is abdominal aortic atherosclerosis without aneurysm. There is no gastrohepatic or hepatoduodenal ligament lymphadenopathy. No intraperitoneal or retroperitoneal lymphadenopathy. No pelvic sidewall lymphadenopathy. Reproductive: The uterus has normal CT imaging appearance. There is no adnexal mass. Other: No intraperitoneal free fluid. Musculoskeletal: Bone windows reveal no worrisome lytic or sclerotic osseous lesions. IMPRESSION: 1. Persistent wall thickening involving a 10 cm segment of sigmoid colon with pericolonic  edema/inflammation similar to the study from 5 days ago. Similar but less prominent appearance noted on a study from 18 months ago. Imaging features are compatible with diverticulitis without perforation or abscess visible at this time. Patient underwent colonoscopy on 08/26/2016 with no mass lesion identified. Electronically Signed   By: Misty Stanley M.D.   On: 05/24/2017 09:07   Ct Abdomen Pelvis W Contrast  Result Date: 05/18/2017 CLINICAL DATA:  61 year old female with bilateral lower abdominal pain for 2 weeks. History of diverticulitis. Recently treated with Flagyl and Cipro. Pain is worse. Initial encounter. EXAM: CT ABDOMEN AND PELVIS WITH CONTRAST TECHNIQUE: Multidetector CT imaging of the abdomen and pelvis was performed using the standard protocol following bolus administration of intravenous contrast. CONTRAST:  147mL ISOVUE-300 IOPAMIDOL (ISOVUE-300) INJECTION 61% COMPARISON:  03/08/2017, 12/02/2016, 11/19/2015 and 04/20/2014 CT. FINDINGS: Lower chest: Minimal basilar scarring. Heart top-normal slightly enlarged. Small hiatal hernia. Hepatobiliary: Top-normal size liver. No worrisome hepatic lesion. Post cholecystectomy. No calcified common bile duct stone. Pancreas: No pancreatic mass or inflammation. Spleen: No splenic mass or enlargement. Adrenals/Urinary Tract:  Slightly atrophic right kidney with chronic pelvis/ caliceal prominence which may reflect result of prior obstruction as noted on prior exams. No left renal collecting system obstruction. Minimally lobulated contour with tiny hyperdense lesion inferior aspect left kidney unchanged from 2015 exam. No adrenal mass. Contracted noncontrast filled urinary bladder unremarkable. Stomach/Bowel: Marked diverticulitis sigmoid colon spanning over 12.5 cm. Although there may be an abscess within the wall of inflamed sigmoid colon, no extraluminal drainable abscess or free intraperitoneal air is noted. This segment of bowel can be assessed on  follow-up after acute episode has cleared. Inflamed sigmoid colon contacts the left adnexae and posterosuperior aspect of the uterus. Small hiatal hernia.  No inflammation surrounds the appendix. Vascular/Lymphatic: Mild calcification lower abdominal aorta. No aortic aneurysm or large vessel occlusion. Increase number of pelvic lymph nodes greater on the left and lower abdominal aortic region felt to be reactive in origin. Reproductive: No primary adnexal abnormality or uterine abnormality. Other: Mild diastases rectus muscles. Musculoskeletal: Degenerative changes lumbar spine. IMPRESSION: Marked diverticulitis sigmoid colon spanning over 12.5 cm. Although there may be an abscess within the wall of inflamed sigmoid colon, no extraluminal drainable abscess or free intraperitoneal air is noted. This segment of bowel can be assessed on follow-up after acute episode has cleared. Inflamed sigmoid colon contacts the left adnexae and posterosuperior aspect of the uterus. Pelvic lymph nodes greater on the left felt to be reactive in origin. Small hiatal hernia. No obstructing renal/ureteral calculi. Electronically Signed   By: Genia Del M.D.   On: 05/18/2017 15:09   US Abdomen Limited  Result Date: 05/19/2017 CLINICAL DATA:  Elevated LFTs EXAM: ULTRASOUND ABDOMEN LIMITED RIGHT UPPER QUADRANT COMPARISON:  05/18/2017 FINDINGS: Gallbladder: Status post cholecystectomy Common bile duct: Diameter: 3 mm. Liver: No focal lesion identified. Within normal limits in parenchymal echogenicity. Portal vein is patent on color Doppler imaging with normal direction of blood flow towards the liver. IMPRESSION: Status post cholecystectomy. No acute abnormality noted. Electronically Signed   By: Inez Catalina M.D.   On: 05/19/2017 13:50    Microbiology: No results found for this or any previous visit (from the past 240 hour(s)).   Labs: Basic Metabolic Panel: Recent Labs  Lab 05/22/17 0508 05/23/17 0917 05/24/17 0355  05/25/17 0420 05/26/17 0505 05/27/17 0558 05/28/17 0540  NA 142 141 140 141 142 139 139  K 3.9 3.4* 3.8 4.0 3.8 3.9 4.0  CL 110 110 111 112* 109 110 108  CO2 26 25 24 24 27 23 23   GLUCOSE 103* 79 106* 108* 96 101* 96  BUN 6 <5* <5* 8 12 9 6   CREATININE 0.93 0.96 0.94 1.01* 1.24* 1.07* 0.84  CALCIUM 8.4* 8.5* 8.0* 8.4* 8.4* 8.2* 8.5*  MG 2.2 1.8 2.4  --  1.8  --   --    Liver Function Tests: Recent Labs  Lab 05/24/17 0355  AST 47*  ALT 35  ALKPHOS 66  BILITOT 0.6  PROT 6.2*  ALBUMIN 3.0*   No results for input(s): LIPASE, AMYLASE in the last 168 hours. No results for input(s): AMMONIA in the last 168 hours. CBC: Recent Labs  Lab 05/22/17 0508 05/23/17 0917 05/24/17 0355 05/25/17 0420 05/26/17 0505 05/27/17 0558 05/28/17 0540  WBC 5.2 4.7 5.5 7.4 5.3 7.3 7.5  NEUTROABS 3.2 2.6 3.1 4.8  --   --   --   HGB 11.3* 12.6 12.4 12.7 12.4 12.4 13.3  HCT 35.3* 39.3 38.0 38.6 38.5 38.4 41.3  MCV 101.1* 101.3* 100.5* 101.0* 100.8*  100.8* 101.5*  PLT 265 298 291 296 314 315 283   Cardiac Enzymes: No results for input(s): CKTOTAL, CKMB, CKMBINDEX, TROPONINI in the last 168 hours. BNP: BNP (last 3 results) Recent Labs    01/11/17 1106  BNP 10.4    ProBNP (last 3 results) No results for input(s): PROBNP in the last 8760 hours.  CBG: No results for input(s): GLUCAP in the last 168 hours.     Signed:  Domenic Polite MD.  [facility] 05/28/2017, 12:41 PM

## 2017-05-28 NOTE — Progress Notes (Signed)
Discharge instructions reviewed with patient. All questions answered. Patient wheeled down to vehicle by nurse tech with belongings.

## 2017-05-28 NOTE — Progress Notes (Signed)
   05/28/17 0000  Musculoskeletal  Musculoskeletal (WDL) X  Musculoskeletal Details  Left Lower Leg Full movement (Patient reported that her left foot is better.)

## 2017-06-02 ENCOUNTER — Inpatient Hospital Stay (HOSPITAL_COMMUNITY)
Admission: EM | Admit: 2017-06-02 | Discharge: 2017-06-17 | DRG: 330 | Disposition: A | Discharge status: Home / Self Care | Payer: Medicaid Other | Attending: Internal Medicine | Admitting: Internal Medicine

## 2017-06-02 ENCOUNTER — Other Ambulatory Visit (HOSPITAL_COMMUNITY): Payer: Self-pay | Admitting: Nurse Practitioner

## 2017-06-02 ENCOUNTER — Encounter (HOSPITAL_COMMUNITY): Payer: Self-pay | Admitting: Family Medicine

## 2017-06-02 ENCOUNTER — Ambulatory Visit (INDEPENDENT_AMBULATORY_CARE_PROVIDER_SITE_OTHER)
Admission: RE | Admit: 2017-06-02 | Discharge: 2017-06-02 | Disposition: A | Discharge status: Home / Self Care | Payer: Medicaid Other | Source: Ambulatory Visit | Attending: Vascular Surgery | Admitting: Vascular Surgery

## 2017-06-02 DIAGNOSIS — Z800 Family history of malignant neoplasm of digestive organs: Secondary | ICD-10-CM

## 2017-06-02 DIAGNOSIS — Z79899 Other long term (current) drug therapy: Secondary | ICD-10-CM

## 2017-06-02 DIAGNOSIS — J449 Chronic obstructive pulmonary disease, unspecified: Secondary | ICD-10-CM | POA: Diagnosis present

## 2017-06-02 DIAGNOSIS — Z9981 Dependence on supplemental oxygen: Secondary | ICD-10-CM

## 2017-06-02 DIAGNOSIS — E785 Hyperlipidemia, unspecified: Secondary | ICD-10-CM | POA: Diagnosis present

## 2017-06-02 DIAGNOSIS — Z8673 Personal history of transient ischemic attack (TIA), and cerebral infarction without residual deficits: Secondary | ICD-10-CM | POA: Diagnosis not present

## 2017-06-02 DIAGNOSIS — N136 Pyonephrosis: Secondary | ICD-10-CM | POA: Diagnosis present

## 2017-06-02 DIAGNOSIS — I110 Hypertensive heart disease with heart failure: Secondary | ICD-10-CM | POA: Diagnosis present

## 2017-06-02 DIAGNOSIS — R51 Headache: Secondary | ICD-10-CM | POA: Insufficient documentation

## 2017-06-02 DIAGNOSIS — R933 Abnormal findings on diagnostic imaging of other parts of digestive tract: Secondary | ICD-10-CM

## 2017-06-02 DIAGNOSIS — Z8249 Family history of ischemic heart disease and other diseases of the circulatory system: Secondary | ICD-10-CM

## 2017-06-02 DIAGNOSIS — K567 Ileus, unspecified: Secondary | ICD-10-CM | POA: Diagnosis not present

## 2017-06-02 DIAGNOSIS — J9611 Chronic respiratory failure with hypoxia: Secondary | ICD-10-CM | POA: Diagnosis present

## 2017-06-02 DIAGNOSIS — K9189 Other postprocedural complications and disorders of digestive system: Secondary | ICD-10-CM | POA: Diagnosis not present

## 2017-06-02 DIAGNOSIS — Z7951 Long term (current) use of inhaled steroids: Secondary | ICD-10-CM

## 2017-06-02 DIAGNOSIS — R1310 Dysphagia, unspecified: Secondary | ICD-10-CM | POA: Diagnosis present

## 2017-06-02 DIAGNOSIS — G4733 Obstructive sleep apnea (adult) (pediatric): Secondary | ICD-10-CM | POA: Diagnosis present

## 2017-06-02 DIAGNOSIS — E876 Hypokalemia: Secondary | ICD-10-CM | POA: Diagnosis present

## 2017-06-02 DIAGNOSIS — F419 Anxiety disorder, unspecified: Secondary | ICD-10-CM | POA: Diagnosis present

## 2017-06-02 DIAGNOSIS — I639 Cerebral infarction, unspecified: Secondary | ICD-10-CM | POA: Diagnosis present

## 2017-06-02 DIAGNOSIS — Z6841 Body Mass Index (BMI) 40.0 and over, adult: Secondary | ICD-10-CM

## 2017-06-02 DIAGNOSIS — M1990 Unspecified osteoarthritis, unspecified site: Secondary | ICD-10-CM | POA: Diagnosis present

## 2017-06-02 DIAGNOSIS — N261 Atrophy of kidney (terminal): Secondary | ICD-10-CM | POA: Diagnosis present

## 2017-06-02 DIAGNOSIS — R112 Nausea with vomiting, unspecified: Secondary | ICD-10-CM

## 2017-06-02 DIAGNOSIS — N739 Female pelvic inflammatory disease, unspecified: Secondary | ICD-10-CM | POA: Diagnosis present

## 2017-06-02 DIAGNOSIS — I10 Essential (primary) hypertension: Secondary | ICD-10-CM | POA: Diagnosis present

## 2017-06-02 DIAGNOSIS — R1032 Left lower quadrant pain: Secondary | ICD-10-CM

## 2017-06-02 DIAGNOSIS — Z8614 Personal history of Methicillin resistant Staphylococcus aureus infection: Secondary | ICD-10-CM

## 2017-06-02 DIAGNOSIS — Z87442 Personal history of urinary calculi: Secondary | ICD-10-CM

## 2017-06-02 DIAGNOSIS — K5792 Diverticulitis of intestine, part unspecified, without perforation or abscess without bleeding: Secondary | ICD-10-CM

## 2017-06-02 DIAGNOSIS — K219 Gastro-esophageal reflux disease without esophagitis: Secondary | ICD-10-CM | POA: Diagnosis present

## 2017-06-02 DIAGNOSIS — Z9049 Acquired absence of other specified parts of digestive tract: Secondary | ICD-10-CM

## 2017-06-02 DIAGNOSIS — I5042 Chronic combined systolic (congestive) and diastolic (congestive) heart failure: Secondary | ICD-10-CM | POA: Diagnosis present

## 2017-06-02 DIAGNOSIS — K5732 Diverticulitis of large intestine without perforation or abscess without bleeding: Principal | ICD-10-CM | POA: Diagnosis present

## 2017-06-02 DIAGNOSIS — Z87440 Personal history of urinary (tract) infections: Secondary | ICD-10-CM

## 2017-06-02 DIAGNOSIS — R1030 Lower abdominal pain, unspecified: Secondary | ICD-10-CM | POA: Diagnosis present

## 2017-06-02 DIAGNOSIS — R197 Diarrhea, unspecified: Secondary | ICD-10-CM

## 2017-06-02 DIAGNOSIS — Z79891 Long term (current) use of opiate analgesic: Secondary | ICD-10-CM

## 2017-06-02 DIAGNOSIS — K5900 Constipation, unspecified: Secondary | ICD-10-CM | POA: Diagnosis present

## 2017-06-02 DIAGNOSIS — Z7982 Long term (current) use of aspirin: Secondary | ICD-10-CM

## 2017-06-02 LAB — COMPREHENSIVE METABOLIC PANEL
ALK PHOS: 62 U/L (ref 38–126)
ALT: 16 U/L (ref 14–54)
AST: 17 U/L (ref 15–41)
Albumin: 3.5 g/dL (ref 3.5–5.0)
Anion gap: 10 (ref 5–15)
BILIRUBIN TOTAL: 0.4 mg/dL (ref 0.3–1.2)
BUN: 11 mg/dL (ref 6–20)
CALCIUM: 8.7 mg/dL — AB (ref 8.9–10.3)
CHLORIDE: 111 mmol/L (ref 101–111)
CO2: 22 mmol/L (ref 22–32)
CREATININE: 1.01 mg/dL — AB (ref 0.44–1.00)
GFR, EST NON AFRICAN AMERICAN: 60 mL/min — AB (ref >60)
Glucose, Bld: 112 mg/dL — ABNORMAL HIGH (ref 65–99)
Potassium: 3.4 mmol/L — ABNORMAL LOW (ref 3.5–5.1)
Sodium: 143 mmol/L (ref 135–145)
TOTAL PROTEIN: 7.5 g/dL (ref 6.5–8.1)

## 2017-06-02 LAB — VAS US CAROTID
LCCADDIAS: 18 cm/s
LCCADSYS: 61 cm/s
LEFT ECA DIAS: -9 cm/s
LEFT VERTEBRAL DIAS: 16 cm/s
LICADDIAS: -28 cm/s
LICADSYS: -67 cm/s
Left CCA prox dias: 25 cm/s
Left CCA prox sys: 110 cm/s
Left ICA prox dias: -9 cm/s
Left ICA prox sys: -33 cm/s
RCCADSYS: -56 cm/s
RIGHT CCA MID DIAS: 12 cm/s
RIGHT ECA DIAS: -9 cm/s
RIGHT VERTEBRAL DIAS: 9 cm/s
Right CCA prox dias: 11 cm/s
Right CCA prox sys: 69 cm/s

## 2017-06-02 LAB — URINALYSIS, ROUTINE W REFLEX MICROSCOPIC
BILIRUBIN URINE: NEGATIVE
Glucose, UA: NEGATIVE mg/dL
HGB URINE DIPSTICK: NEGATIVE
Ketones, ur: NEGATIVE mg/dL
NITRITE: NEGATIVE
PH: 7 (ref 5.0–8.0)
Protein, ur: NEGATIVE mg/dL
SPECIFIC GRAVITY, URINE: 1.015 (ref 1.005–1.030)

## 2017-06-02 LAB — CBC
HCT: 38.3 % (ref 36.0–46.0)
Hemoglobin: 12.6 g/dL (ref 12.0–15.0)
MCH: 33 pg (ref 26.0–34.0)
MCHC: 32.9 g/dL (ref 30.0–36.0)
MCV: 100.3 fL — ABNORMAL HIGH (ref 78.0–100.0)
PLATELETS: 450 10*3/uL — AB (ref 150–400)
RBC: 3.82 MIL/uL — AB (ref 3.87–5.11)
RDW: 14.1 % (ref 11.5–15.5)
WBC: 6.3 10*3/uL (ref 4.0–10.5)

## 2017-06-02 LAB — I-STAT BETA HCG BLOOD, ED (MC, WL, AP ONLY): I-stat hCG, quantitative: <5 m[IU]/mL (ref <5)

## 2017-06-02 LAB — LIPASE, BLOOD: LIPASE: 23 U/L (ref 11–51)

## 2017-06-02 NOTE — ED Triage Notes (Signed)
Patient is complaining of lower abd pain and relates the pain to recurrent diverticulitis. She was admitted on 11/12 for diverticulitis and discharged on 11/22. Also, she is oxygen dependent on 4L nasal cannula and was not wearing oxygen when arrived tonight. After triaging, placed patient on oxygen via 4L [place]. Prior to the hospital visit, she was experiencing lower abd pain and prescribed Flagyl and Cipro by her PCP. Patient reports she has called for an appointment with Dr. Eldridge Abrahams but does not have an appointment.

## 2017-06-03 ENCOUNTER — Other Ambulatory Visit: Payer: Self-pay

## 2017-06-03 ENCOUNTER — Encounter (HOSPITAL_COMMUNITY): Payer: Self-pay

## 2017-06-03 ENCOUNTER — Emergency Department (HOSPITAL_COMMUNITY): Payer: Medicaid Other

## 2017-06-03 DIAGNOSIS — K219 Gastro-esophageal reflux disease without esophagitis: Secondary | ICD-10-CM

## 2017-06-03 DIAGNOSIS — Z9981 Dependence on supplemental oxygen: Secondary | ICD-10-CM | POA: Diagnosis not present

## 2017-06-03 DIAGNOSIS — N261 Atrophy of kidney (terminal): Secondary | ICD-10-CM | POA: Diagnosis present

## 2017-06-03 DIAGNOSIS — J9611 Chronic respiratory failure with hypoxia: Secondary | ICD-10-CM

## 2017-06-03 DIAGNOSIS — E876 Hypokalemia: Secondary | ICD-10-CM | POA: Diagnosis present

## 2017-06-03 DIAGNOSIS — R112 Nausea with vomiting, unspecified: Secondary | ICD-10-CM | POA: Diagnosis not present

## 2017-06-03 DIAGNOSIS — I639 Cerebral infarction, unspecified: Secondary | ICD-10-CM | POA: Diagnosis present

## 2017-06-03 DIAGNOSIS — R197 Diarrhea, unspecified: Secondary | ICD-10-CM

## 2017-06-03 DIAGNOSIS — E785 Hyperlipidemia, unspecified: Secondary | ICD-10-CM

## 2017-06-03 DIAGNOSIS — R1032 Left lower quadrant pain: Secondary | ICD-10-CM | POA: Diagnosis not present

## 2017-06-03 DIAGNOSIS — R1310 Dysphagia, unspecified: Secondary | ICD-10-CM | POA: Diagnosis present

## 2017-06-03 DIAGNOSIS — K9189 Other postprocedural complications and disorders of digestive system: Secondary | ICD-10-CM | POA: Diagnosis not present

## 2017-06-03 DIAGNOSIS — I5042 Chronic combined systolic (congestive) and diastolic (congestive) heart failure: Secondary | ICD-10-CM | POA: Diagnosis present

## 2017-06-03 DIAGNOSIS — K5732 Diverticulitis of large intestine without perforation or abscess without bleeding: Secondary | ICD-10-CM | POA: Diagnosis present

## 2017-06-03 DIAGNOSIS — Z87442 Personal history of urinary calculi: Secondary | ICD-10-CM | POA: Diagnosis not present

## 2017-06-03 DIAGNOSIS — R1084 Generalized abdominal pain: Secondary | ICD-10-CM

## 2017-06-03 DIAGNOSIS — Z6841 Body Mass Index (BMI) 40.0 and over, adult: Secondary | ICD-10-CM | POA: Diagnosis not present

## 2017-06-03 DIAGNOSIS — I110 Hypertensive heart disease with heart failure: Secondary | ICD-10-CM | POA: Diagnosis present

## 2017-06-03 DIAGNOSIS — R509 Fever, unspecified: Secondary | ICD-10-CM

## 2017-06-03 DIAGNOSIS — Z800 Family history of malignant neoplasm of digestive organs: Secondary | ICD-10-CM | POA: Diagnosis not present

## 2017-06-03 DIAGNOSIS — M1990 Unspecified osteoarthritis, unspecified site: Secondary | ICD-10-CM | POA: Diagnosis present

## 2017-06-03 DIAGNOSIS — R933 Abnormal findings on diagnostic imaging of other parts of digestive tract: Secondary | ICD-10-CM | POA: Diagnosis not present

## 2017-06-03 DIAGNOSIS — F419 Anxiety disorder, unspecified: Secondary | ICD-10-CM | POA: Diagnosis present

## 2017-06-03 DIAGNOSIS — I10 Essential (primary) hypertension: Secondary | ICD-10-CM

## 2017-06-03 DIAGNOSIS — R1030 Lower abdominal pain, unspecified: Secondary | ICD-10-CM | POA: Diagnosis present

## 2017-06-03 DIAGNOSIS — K5900 Constipation, unspecified: Secondary | ICD-10-CM | POA: Diagnosis present

## 2017-06-03 DIAGNOSIS — K567 Ileus, unspecified: Secondary | ICD-10-CM | POA: Diagnosis not present

## 2017-06-03 DIAGNOSIS — J449 Chronic obstructive pulmonary disease, unspecified: Secondary | ICD-10-CM | POA: Diagnosis present

## 2017-06-03 DIAGNOSIS — N739 Female pelvic inflammatory disease, unspecified: Secondary | ICD-10-CM | POA: Diagnosis present

## 2017-06-03 DIAGNOSIS — G4733 Obstructive sleep apnea (adult) (pediatric): Secondary | ICD-10-CM | POA: Diagnosis present

## 2017-06-03 DIAGNOSIS — N136 Pyonephrosis: Secondary | ICD-10-CM | POA: Diagnosis present

## 2017-06-03 DIAGNOSIS — K5792 Diverticulitis of intestine, part unspecified, without perforation or abscess without bleeding: Secondary | ICD-10-CM | POA: Diagnosis present

## 2017-06-03 DIAGNOSIS — Z8673 Personal history of transient ischemic attack (TIA), and cerebral infarction without residual deficits: Secondary | ICD-10-CM | POA: Diagnosis not present

## 2017-06-03 LAB — BASIC METABOLIC PANEL
ANION GAP: 7 (ref 5–15)
BUN: 9 mg/dL (ref 6–20)
CHLORIDE: 110 mmol/L (ref 101–111)
CO2: 24 mmol/L (ref 22–32)
Calcium: 8.4 mg/dL — ABNORMAL LOW (ref 8.9–10.3)
Creatinine, Ser: 0.89 mg/dL (ref 0.44–1.00)
GFR calc Af Amer: >60 mL/min (ref >60)
GLUCOSE: 98 mg/dL (ref 65–99)
POTASSIUM: 3.6 mmol/L (ref 3.5–5.1)
Sodium: 141 mmol/L (ref 135–145)

## 2017-06-03 LAB — CBC
HEMATOCRIT: 37.4 % (ref 36.0–46.0)
HEMOGLOBIN: 12.1 g/dL (ref 12.0–15.0)
MCH: 32.4 pg (ref 26.0–34.0)
MCHC: 32.4 g/dL (ref 30.0–36.0)
MCV: 100.3 fL — AB (ref 78.0–100.0)
Platelets: 413 10*3/uL — ABNORMAL HIGH (ref 150–400)
RBC: 3.73 MIL/uL — ABNORMAL LOW (ref 3.87–5.11)
RDW: 13.8 % (ref 11.5–15.5)
WBC: 5.9 10*3/uL (ref 4.0–10.5)

## 2017-06-03 LAB — MAGNESIUM: Magnesium: 1.9 mg/dL (ref 1.7–2.4)

## 2017-06-03 LAB — I-STAT CG4 LACTIC ACID, ED: Lactic Acid, Venous: 0.51 mmol/L (ref 0.5–1.9)

## 2017-06-03 LAB — BRAIN NATRIURETIC PEPTIDE: B NATRIURETIC PEPTIDE 5: 17.5 pg/mL (ref 0.0–100.0)

## 2017-06-03 MED ORDER — HYDROMORPHONE HCL 1 MG/ML IJ SOLN
1.0000 mg | INTRAMUSCULAR | Status: DC | PRN
  Administered 2017-06-03 – 2017-06-11 (×25): 1 mg via INTRAVENOUS
  Filled 2017-06-03 (×26): qty 1

## 2017-06-03 MED ORDER — METOPROLOL SUCCINATE ER 50 MG PO TB24
50.0000 mg | ORAL_TABLET | Freq: Every day | ORAL | Status: DC
  Administered 2017-06-03 – 2017-06-16 (×15): 50 mg via ORAL
  Filled 2017-06-03 (×16): qty 1

## 2017-06-03 MED ORDER — CHLORHEXIDINE GLUCONATE 0.12 % MT SOLN
15.0000 mL | Freq: Two times a day (BID) | OROMUCOSAL | Status: DC
  Administered 2017-06-03 – 2017-06-16 (×21): 15 mL via OROMUCOSAL
  Filled 2017-06-03 (×23): qty 15

## 2017-06-03 MED ORDER — IOPAMIDOL (ISOVUE-300) INJECTION 61%
INTRAVENOUS | Status: AC
  Administered 2017-06-03: 100 mL via INTRAVENOUS
  Filled 2017-06-03: qty 100

## 2017-06-03 MED ORDER — HYOSCYAMINE SULFATE ER 0.375 MG PO TB12
0.3750 mg | ORAL_TABLET | Freq: Two times a day (BID) | ORAL | Status: DC | PRN
  Administered 2017-06-03: 0.375 mg via ORAL
  Filled 2017-06-03: qty 1

## 2017-06-03 MED ORDER — ONDANSETRON HCL 4 MG/2ML IJ SOLN
4.0000 mg | Freq: Three times a day (TID) | INTRAMUSCULAR | Status: DC | PRN
  Administered 2017-06-03 – 2017-06-07 (×5): 4 mg via INTRAVENOUS
  Filled 2017-06-03 (×6): qty 2

## 2017-06-03 MED ORDER — ALBUTEROL SULFATE (2.5 MG/3ML) 0.083% IN NEBU
3.0000 mL | INHALATION_SOLUTION | RESPIRATORY_TRACT | Status: DC | PRN
  Administered 2017-06-11: 3 mL via RESPIRATORY_TRACT
  Filled 2017-06-03: qty 3

## 2017-06-03 MED ORDER — CIPROFLOXACIN IN D5W 400 MG/200ML IV SOLN
400.0000 mg | Freq: Once | INTRAVENOUS | Status: AC
  Administered 2017-06-03: 400 mg via INTRAVENOUS
  Filled 2017-06-03: qty 200

## 2017-06-03 MED ORDER — SODIUM CHLORIDE 0.9% FLUSH
10.0000 mL | INTRAVENOUS | Status: DC | PRN
  Administered 2017-06-09: 10 mL
  Filled 2017-06-03: qty 40

## 2017-06-03 MED ORDER — MOMETASONE FURO-FORMOTEROL FUM 200-5 MCG/ACT IN AERO
2.0000 | INHALATION_SPRAY | Freq: Two times a day (BID) | RESPIRATORY_TRACT | Status: DC
  Administered 2017-06-06 – 2017-06-15 (×5): 2 via RESPIRATORY_TRACT
  Filled 2017-06-03: qty 8.8

## 2017-06-03 MED ORDER — ENOXAPARIN SODIUM 40 MG/0.4ML ~~LOC~~ SOLN
40.0000 mg | SUBCUTANEOUS | Status: DC
  Administered 2017-06-03 – 2017-06-10 (×8): 40 mg via SUBCUTANEOUS
  Filled 2017-06-03 (×9): qty 0.4

## 2017-06-03 MED ORDER — LIP MEDEX EX OINT
TOPICAL_OINTMENT | Freq: Once | CUTANEOUS | Status: AC
  Administered 2017-06-03: 1 via TOPICAL
  Filled 2017-06-03: qty 7

## 2017-06-03 MED ORDER — ORAL CARE MOUTH RINSE
15.0000 mL | Freq: Two times a day (BID) | OROMUCOSAL | Status: DC
  Administered 2017-06-03 – 2017-06-16 (×19): 15 mL via OROMUCOSAL

## 2017-06-03 MED ORDER — IOPAMIDOL (ISOVUE-300) INJECTION 61%
100.0000 mL | Freq: Once | INTRAVENOUS | Status: AC | PRN
  Administered 2017-06-03: 100 mL via INTRAVENOUS

## 2017-06-03 MED ORDER — DICYCLOMINE HCL 20 MG PO TABS
20.0000 mg | ORAL_TABLET | Freq: Four times a day (QID) | ORAL | Status: DC
  Administered 2017-06-03 – 2017-06-05 (×9): 20 mg via ORAL
  Filled 2017-06-03 (×10): qty 1

## 2017-06-03 MED ORDER — HYDRALAZINE HCL 20 MG/ML IJ SOLN: 5.0000 mg | INTRAMUSCULAR | Status: DC | PRN

## 2017-06-03 MED ORDER — CLONAZEPAM 0.5 MG PO TABS
0.5000 mg | ORAL_TABLET | Freq: Two times a day (BID) | ORAL | Status: DC | PRN
  Administered 2017-06-10: 0.5 mg via ORAL
  Filled 2017-06-03: qty 1

## 2017-06-03 MED ORDER — ACETAMINOPHEN 325 MG PO TABS
650.0000 mg | ORAL_TABLET | Freq: Four times a day (QID) | ORAL | Status: DC | PRN
  Administered 2017-06-04 – 2017-06-12 (×4): 650 mg via ORAL
  Filled 2017-06-03 (×5): qty 2

## 2017-06-03 MED ORDER — PROMETHAZINE HCL 25 MG PO TABS
25.0000 mg | ORAL_TABLET | Freq: Three times a day (TID) | ORAL | Status: DC | PRN
  Administered 2017-06-03 – 2017-06-04 (×4): 25 mg via ORAL
  Filled 2017-06-03 (×4): qty 1

## 2017-06-03 MED ORDER — TOPIRAMATE 25 MG PO TABS
50.0000 mg | ORAL_TABLET | Freq: Two times a day (BID) | ORAL | Status: DC
  Administered 2017-06-03 – 2017-06-17 (×29): 50 mg via ORAL
  Filled 2017-06-03 (×29): qty 2

## 2017-06-03 MED ORDER — AMLODIPINE BESYLATE 10 MG PO TABS
10.0000 mg | ORAL_TABLET | Freq: Every day | ORAL | Status: DC
  Administered 2017-06-03 – 2017-06-17 (×14): 10 mg via ORAL
  Filled 2017-06-03 (×3): qty 1
  Filled 2017-06-03: qty 2
  Filled 2017-06-03 (×12): qty 1

## 2017-06-03 MED ORDER — PANTOPRAZOLE SODIUM 40 MG PO TBEC
40.0000 mg | DELAYED_RELEASE_TABLET | Freq: Every day | ORAL | Status: DC
  Administered 2017-06-03 – 2017-06-17 (×15): 40 mg via ORAL
  Filled 2017-06-03 (×15): qty 1

## 2017-06-03 MED ORDER — METRONIDAZOLE IN NACL 5-0.79 MG/ML-% IV SOLN: 500.0000 mg | Freq: Once | INTRAVENOUS | Status: DC

## 2017-06-03 MED ORDER — ENSURE ENLIVE PO LIQD
237.0000 mL | Freq: Two times a day (BID) | ORAL | Status: DC
  Administered 2017-06-04 – 2017-06-16 (×9): 237 mL via ORAL

## 2017-06-03 MED ORDER — POTASSIUM CHLORIDE 20 MEQ/15ML (10%) PO SOLN
20.0000 meq | Freq: Once | ORAL | Status: AC
  Administered 2017-06-03: 20 meq via ORAL
  Filled 2017-06-03: qty 15

## 2017-06-03 MED ORDER — ASPIRIN EC 81 MG PO TBEC
81.0000 mg | DELAYED_RELEASE_TABLET | Freq: Every day | ORAL | Status: DC
  Administered 2017-06-03 – 2017-06-17 (×15): 81 mg via ORAL
  Filled 2017-06-03 (×16): qty 1

## 2017-06-03 MED ORDER — FLUTICASONE PROPIONATE 50 MCG/ACT NA SUSP
2.0000 | Freq: Every day | NASAL | Status: DC
  Administered 2017-06-03 – 2017-06-16 (×7): 2 via NASAL
  Filled 2017-06-03: qty 16

## 2017-06-03 MED ORDER — MORPHINE SULFATE (PF) 4 MG/ML IV SOLN
INTRAVENOUS | Status: AC
  Administered 2017-06-03
  Filled 2017-06-03: qty 1

## 2017-06-03 MED ORDER — MORPHINE SULFATE (PF) 4 MG/ML IV SOLN
4.0000 mg | Freq: Once | INTRAVENOUS | Status: AC
  Administered 2017-06-03: 4 mg via INTRAVENOUS

## 2017-06-03 MED ORDER — OXYCODONE HCL 5 MG PO TABS
5.0000 mg | ORAL_TABLET | ORAL | Status: DC | PRN
  Administered 2017-06-03 – 2017-06-17 (×13): 5 mg via ORAL
  Filled 2017-06-03 (×15): qty 1

## 2017-06-03 MED ORDER — METRONIDAZOLE IN NACL 5-0.79 MG/ML-% IV SOLN
500.0000 mg | Freq: Three times a day (TID) | INTRAVENOUS | Status: DC
  Administered 2017-06-03 – 2017-06-08 (×16): 500 mg via INTRAVENOUS
  Filled 2017-06-03 (×17): qty 100

## 2017-06-03 MED ORDER — ATORVASTATIN CALCIUM 10 MG PO TABS
10.0000 mg | ORAL_TABLET | Freq: Every day | ORAL | Status: DC
  Administered 2017-06-03 – 2017-06-17 (×15): 10 mg via ORAL
  Filled 2017-06-03 (×16): qty 1

## 2017-06-03 MED ORDER — CIPROFLOXACIN IN D5W 400 MG/200ML IV SOLN
400.0000 mg | Freq: Two times a day (BID) | INTRAVENOUS | Status: DC
  Administered 2017-06-03 – 2017-06-07 (×10): 400 mg via INTRAVENOUS
  Filled 2017-06-03 (×9): qty 200

## 2017-06-03 MED ORDER — ZOLPIDEM TARTRATE 5 MG PO TABS
5.0000 mg | ORAL_TABLET | Freq: Every evening | ORAL | Status: DC | PRN
  Filled 2017-06-03: qty 1

## 2017-06-03 MED ORDER — DIPHENHYDRAMINE HCL 25 MG PO CAPS
25.0000 mg | ORAL_CAPSULE | Freq: Every evening | ORAL | Status: DC | PRN
  Administered 2017-06-05: 25 mg via ORAL
  Filled 2017-06-03: qty 1

## 2017-06-03 NOTE — H&P (Addendum)
History and Physical    Ann [name]:[id number] DOB: 09-Aug-1955 DOA: 06/02/2017  Referring MD/NP/PA:   PCP: Berkley Harvey, NP   Patient coming from:  The patient is coming from home.  At baseline, pt is independent for most of ADL.    Chief Complaint: Nausea, vomiting, diarrhea, lower abdominal pain  HPI: Ann [name] is a 61 y.o. female with medical history significant of recurrent diverticulitis, hypertension, hyperlipidemia, stroke, TIA, GERD, anxiety, OSA on CPAP, obesity, CHF with EF of 45-50 percent, chronic respiratory failure on 4 L oxygen at home, who presents with nausea, vomiting, diarrhea and lower abdominal pain.  Patient was recently hospitalized from 11/12-11/22 due to recurrent severe diverticulitis. Pt was treated with Cipro and Flagyl, then IV Zosyn, and discharged on PO Augmentin. The plan was to follow-up with GI and Dr. Ninfa [name] with general surgery for possible hemicolectomy. Patient reports she has called for an appointment with Dr. Eldridge Abrahams but does not have an appointment. She states that she continues to have nausea, vomiting, diarrhea and abdominal pain, which has worsened in the past 5 days. She vomited once today. She has had 3 watery diarrhea today. She has lower abdominal pain, which is constant, 8 out of 10 in severity, pressure-like, nonradiating. She denies fever, but has chills. Patient has chronic mild cough and shortness of breath, which has not changed. No chest pain. Patient states that he has some mild burning on urination, but no dysuria or increased urinary frequency. Prior to the hospital visit, she was prescribed Flagyl and Cipro by her PCP, without significant help.  ED Course: pt was found to have WBC 6.3, lactic acid is 0.51, lipase 23, urinalysis positive with small amount of leukocyte, potassium 3.4, creatinine 1.01, temperature normal, no tachycardia, oxygen saturation 94% on room air. Patient is admitted to telemetry bed as  inpatient.  CT of abdomen/pelvis showed: 1. Focal wall thickening with moderate surrounding inflammation at the sigmoid colon, thought previously related to diverticulitis. There now appears to be mild colon wall thickening at the splenic flexure and descending colon, therefore consider colitis, secondaryto infectious or inflammatory bowel disease. There is no perforation or drainable abscess. 2. Atrophic right kidney as before.  Review of Systems:   General: no fevers, has chills, has poor appetite, has fatigue HEENT: no blurry vision, hearing changes or sore throat Respiratory: no dyspnea, coughing, wheezing CV: no chest pain, no palpitations GI: has nausea, vomiting, abdominal pain, diarrhea, no constipation GU: no dysuria, has burning on urination, no increased urinary frequency, hematuria  Ext: no leg edema Neuro: no unilateral weakness, numbness, or tingling, no vision change or hearing loss Skin: no rash, no skin tear. MSK: No muscle spasm, no deformity, no limitation of range of movement in spin Heme: No easy bruising.  Travel history: No recent long distant travel.  Allergy:  Allergies  Allergen Reactions  . Lisinopril Cough  . Doxycycline Diarrhea    Severe headaches  . Metronidazole Other (See Comments)    Severe headaches    Past Medical History:  Diagnosis Date  . Adenoma of colon   . Allergy    seasonal  . Anemia    with past pregnancy -not recent  . Anxiety   . Arthritis   . Borderline diabetes   . Chronic cystitis   . Complication of anesthesia    02-20-2014 ([facility]) INTRA-OP RESPIRATORY FAILURE SECONDARY TO POSSIBLE MUCOUS ASPIRATION/  ALSO 06-06-2013 [facility]) POST-OP DESATURATION  EVEN USING CPAP, States some issues  if Propofol given to rapidly.  . Diverticulitis   . Diverticulosis of colon   . Dyspnea   . Family history of adverse reaction to anesthesia    PER PT SISTER DIED AFTER GENERAL ANESTHESIA DUE TO UNDIAGNOSED OSA  . GERD (gastroesophageal reflux  disease)   . H/O hiatal hernia   . Headache(784.0)    migraines, decreased since turning 50's  . History of chronic cough    "tight sounding cough"  . History of esophageal dilatation   . History of kidney stones   . History of MRSA infection    3/ 2015  AXILLARY ABSCESS  . History of recurrent UTIs   . History of TIAs NO RESIDUAL   1980;  2005;   2008 PT STATES PER CT  SCARRING RIGHT SIDE OF BRAIN  . Hyperlipidemia   . Hypertension   . Kidney disease    III  . Neuromuscular disorder ([place])    HH  . Neuropathy    pt denies this 07-05-2015  . Nocturia   . OSA on CPAP    PER SLEEP STUDY 09-08-2012  MODERATE OSA. not used in awhile, but thinks needs to start back using due to some weight gain.  . Sleep apnea   . Stroke [facility]) [phone]   TIA'S  . SUI (stress urinary incontinence, female)     Past Surgical History:  Procedure Laterality Date  . ANTERIOR AND POSTERIOR REPAIR N/A 06/06/2013   Procedure: Anterior vaginal vault repair, Sacrospinous ligament fixation with UPHOLD lite, Kelly plication, Sacrospinous mesh fixation, Solyx transurethral sling;  Surgeon: Ailene Rud, MD;  Location: [facility];  Service: Urology;  Laterality: N/A;  . CHOLECYSTECTOMY N/A 02/10/2017   Procedure: LAPAROSCOPIC CHOLECYSTECTOMY;  Surgeon: Coralie Keens, MD;  Location: [place];  Service: General;  Laterality: N/A;  . CYSTOSCOPY W/ URETERAL STENT PLACEMENT Right 04/21/2014   Procedure: CYSTOSCOPY WITH RETROGRADE PYELOGRAM/URETERAL STENT PLACEMENT;  Surgeon: Ailene Rud, MD;  Location: [facility];  Service: Urology;  Laterality: Right;  . CYSTOSCOPY W/ URETERAL STENT PLACEMENT Right 11/21/2014   Procedure: CYSTOSCOPY WITH RETROGRADE PYELOGRAM, URETEROSCOPY WITH STONE REMOVAL, URETERAL STENT PLACEMENT;  Surgeon: Carolan Clines, MD;  Location: [facility];  Service: Urology;  Laterality: Right;  . CYSTOSCOPY W/ URETERAL STENT PLACEMENT Left 02/01/2016   Procedure: CYSTO,  LEFT URETEROSCOPY, LEFT RETROGRADE AND LEFT URETERAL STENT PLACEMENT;  Surgeon: Carolan Clines, MD;  Location: [facility];  Service: Urology;  Laterality: Left;  . CYSTOSCOPY WITH RETROGRADE PYELOGRAM, URETEROSCOPY AND STENT PLACEMENT Right 06/05/2014   Procedure: CYSTOSCOPY WITH RIGHT RETROGRADE PYELOGRAM, RIGHT URETEROSCOPY, STONE EXTRACTION AND RIGHT DOUBLE J STENT PLACEMENT;  Surgeon: Ailene Rud, MD;  Location: [facility];  Service: Urology;  Laterality: Right;  . ESOPHAGEAL DILATION  X2  LAST ONE  JUNE 2014   has had 9 of these  . ESOPHAGEAL MANOMETRY N/A 09/10/2015   Procedure: ESOPHAGEAL MANOMETRY (EM);  Surgeon: Manus Gunning, MD;  Location[facility];  Service: Gastroenterology;  Laterality: N/A;  . HOLMIUM LASER APPLICATION Right 95/18/8416   Procedure: HOLMIUM LASER OF STONE ;  Surgeon: Ailene Rud, MD;  Location: [facility];  Service: Urology;  Laterality: Right;  . KNEE ARTHROSCOPY Bilateral 2008  . LAPAROSCOPIC CHOLECYSTECTOMY  02/10/2017  . MULTIPLE TOOTH EXTRACTIONS     past hx  . RECTOCELE REPAIR N/A 02/20/2014   Procedure: POSTERIOR REPAIR (RECTOCELE) WITH VAGINAL VAULT REPAIR WITH ACELL GRAFT PLACEMENT, PERINEOPLASTY, ENTEROCELE REPAIR;  Surgeon: Shane Crutch  Gaynelle Arabian, MD;  Location: [facility];  Service: Urology;  Laterality: N/A;  . TUBAL LIGATION  1987    Social History:  reports that  has never smoked. she has never used smokeless tobacco. She reports that she does not drink alcohol or use drugs.  Family History:  Family History  Problem Relation Age of Onset  . Colon cancer Father 64       died at 13  . Heart disease Mother        died at 64  . Heart attack Mother   . Heart attack Sister        died at 50  . Hypertension Brother   . Heart attack Cousin 59       with death  . Heart attack Cousin 45       died with MI  . Esophageal cancer Neg Hx   . Rectal cancer Neg Hx   . Stomach cancer Neg Hx   .  Colon polyps Neg Hx      Prior to Admission medications   Medication Sig Start Date End Date Taking? Authorizing Provider  albuterol (PROAIR HFA) 108 (90 Base) MCG/ACT inhaler Inhale 2 puffs into the lungs every 4 (four) hours as needed. For cough wheezing. 08/07/15  Yes [provider]  albuterol (PROVENTIL) (2.5 MG/3ML) 0.083% nebulizer solution Inhale 3 mLs into the lungs every 6 (six) hours as needed. Wheezing/shortness of breath 08/07/15  Yes [provider]  amLODipine (NORVASC) 10 MG tablet Take 10 mg by mouth daily.    Yes [provider]  aspirin EC 81 MG tablet Take 81 mg daily by mouth.   Yes [provider]  atorvastatin (LIPITOR) 10 MG tablet Take 10 mg daily by mouth. 05/15/17  Yes [provider]  budesonide-formoterol (SYMBICORT) 160-4.5 MCG/ACT inhaler Inhale 2 puffs 2 (two) times daily into the lungs. 04/21/17 04/21/18 Yes [provider]  chlorpheniramine (CHLOR-TRIMETON) 4 MG tablet Take 1 tablet (4 mg total) by mouth at bedtime. 03/08/15  Yes Chesley Mires, MD  clonazePAM (KLONOPIN) 0.5 MG tablet Take 0.5 mg by mouth 2 (two) times daily as needed for anxiety.    Yes [provider]  dicyclomine (BENTYL) 20 MG tablet Take 20 mg by mouth 4 (four) times daily. 05/29/17  Yes [provider]  fluticasone (FLONASE) 50 MCG/ACT nasal spray Place 2 sprays daily into both nostrils. 08/07/15  Yes [provider]  furosemide (LASIX) 40 MG tablet Take 0.5 tablets (20 mg total) by mouth daily. 05/28/17  Yes Domenic Polite, MD  hyoscyamine (LEVBID) 0.375 MG 12 hr tablet Take 1 tablet (0.375 mg total) by mouth every 12 (twelve) hours as needed for cramping. 05/28/17  Yes Domenic Polite, MD  metoprolol succinate (TOPROL-XL) 50 MG 24 hr tablet Take 50 mg by mouth at bedtime.  08/08/15  Yes [provider]  omeprazole (PRILOSEC) 40 MG capsule TAKE 1 CAPSULE(40 MG) BY MOUTH TWICE DAILY 09/17/16  Yes Armbruster,  [name], MD  oxyCODONE (ROXICODONE) 5 MG immediate release tablet Take 1 tablet (5 mg total) by mouth every 4 (four) hours as needed for severe pain. 05/28/17  Yes Domenic Polite, MD  polyethylene glycol [facility] Floria Raveling) packet Take 17 g by mouth daily as needed for moderate constipation. 01/16/17  Yes Bonnielee Haff, MD  topiramate (TOPAMAX) 50 MG tablet Take 50 mg by mouth 2 (two) times daily.   Yes [provider]  OXYGEN Inhale into the lungs.    [provider]  UNABLE TO FIND Med Name: CPAP    [provider]    Physical Exam: Vitals:   06/02/17 2147 06/02/17 2150 06/03/17 0121 06/03/17 0211  BP:  (!) 158/94 136/84 (!) 145/75  Pulse:  66 62 86  Resp:  20  17  Temp:  98.3 F (36.8 C)    TempSrc:  Oral    SpO2:  94% 96% 97%  Weight: 100.7 kg (222 lb)     Height: 5\' 2"  (1.575 m)      General: Not in acute distress HEENT:       Eyes: PERRL, EOMI, no scleral icterus.       ENT: No discharge from the ears and nose, no pharynx injection, no tonsillar enlargement.        Neck: No JVD, no bruit, no mass felt. Heme: No neck lymph node enlargement. Cardiac: S1/S2, RRR, No murmurs, No gallops or rubs. Respiratory: No rales, wheezing, rhonchi or rubs. GI: Soft, nondistended, has tenderness in lower abdomen, no rebound pain, no organomegaly, BS present. GU: No hematuria Ext: No pitting leg edema bilaterally. 2+DP/PT pulse bilaterally. Musculoskeletal: No joint deformities, No joint redness or warmth, no limitation of ROM in spin. Skin: No rashes.  Neuro: Alert, oriented X3, cranial nerves II-XII grossly intact, moves all extremities normally.  Psych: Patient is not psychotic, no suicidal or hemocidal ideation.  Labs on Admission: I have personally reviewed following labs and imaging studies  CBC: Recent Labs  Lab 05/27/17 0558 05/28/17 0540 06/02/17 2300  WBC 7.3 7.5 6.3  HGB 12.4 13.3 12.6  HCT 38.4 41.3 38.3  MCV 100.8* 101.5* 100.3*  PLT  315 283 810*   Basic Metabolic Panel: Recent Labs  Lab 05/27/17 0558 05/28/17 0540 06/02/17 2300  NA 139 139 143  K 3.9 4.0 3.4*  CL 110 108 111  CO2 23 23 22   GLUCOSE 101* 96 112*  BUN 9 6 11   CREATININE 1.07* 0.84 1.01*  CALCIUM 8.2* 8.5* 8.7*   GFR: Estimated Creatinine Clearance: 66.6 mL/min (A) (by C-G formula based on SCr of 1.01 mg/dL (H)). Liver Function Tests: Recent Labs  Lab 06/02/17 2300  AST 17  ALT 16  ALKPHOS 62  BILITOT 0.4  PROT 7.5  ALBUMIN 3.5   Recent Labs  Lab 06/02/17 2300  LIPASE 23   No results for input(s): AMMONIA in the last 168 hours. Coagulation Profile: No results for input(s): INR, PROTIME in the last 168 hours. Cardiac Enzymes: No results for input(s): CKTOTAL, CKMB, CKMBINDEX, TROPONINI in the last 168 hours. BNP (last 3 results) No results for input(s): PROBNP in the last 8760 hours. HbA1C: No results for input(s): HGBA1C in the last 72 hours. CBG: No results for input(s): GLUCAP in the last 168 hours. Lipid Profile: No results for input(s): CHOL, HDL, LDLCALC, TRIG, CHOLHDL, LDLDIRECT in the last 72 hours. Thyroid Function Tests: No results for input(s): TSH, T4TOTAL, FREET4, T3FREE, THYROIDAB in the last 72 hours. Anemia Panel: No results for input(s): VITAMINB12, FOLATE, FERRITIN, TIBC, IRON, RETICCTPCT in the last 72 hours. Urine analysis:    Component Value Date/Time   COLORURINE YELLOW 06/02/2017 2223   APPEARANCEUR CLOUDY (A) 06/02/2017 2223   LABSPEC 1.015 06/02/2017 2223   PHURINE 7.0 06/02/2017 2223   GLUCOSEU NEGATIVE 06/02/2017 2223   HGBUR NEGATIVE 06/02/2017 2223   BILIRUBINUR NEGATIVE 06/02/2017 2223   KETONESUR NEGATIVE 06/02/2017 2223   PROTEINUR NEGATIVE 06/02/2017 2223   UROBILINOGEN 0.2 02/09/2015 1322   NITRITE NEGATIVE 06/02/2017  [place] (A) 06/02/2017 2223   Sepsis Labs: @LABRCNTIP (procalcitonin:4,lacticidven:4) )No results found for this or any previous visit (from the  past 240 hour(s)).   Radiological Exams on Admission: Ct Abdomen Pelvis W Contrast  Result Date: 06/03/2017 CLINICAL DATA:  Abdominal pain EXAM: CT ABDOMEN AND PELVIS WITH CONTRAST TECHNIQUE: Multidetector CT imaging of the abdomen and pelvis was performed using the standard protocol following bolus administration of intravenous contrast. CONTRAST:  100 mL Isovue-300 intravenous COMPARISON:  05/23/2017, 03/08/2017, 12/02/2016, 12/31/2015 FINDINGS: Lower chest: No acute abnormality. Hepatobiliary: No focal liver abnormality is seen. Status post cholecystectomy. No biliary dilatation. Pancreas: Unremarkable. No pancreatic ductal dilatation or surrounding inflammatory changes. Spleen: Normal in size without focal abnormality. Adrenals/Urinary Tract: Adrenal glands are within normal limits. Stable prominent right renal pelvis. Atrophic right kidney. subcentimeter cortical hypodensity in the right kidney too small to further characterize. Bladder normal Stomach/Bowel: Stomach is nonenlarged. No dilated small bowel. Wall thickening and surrounding inflammation at the sigmoid colon. This does not appear significantly changed. Colon wall thickening now appears to involve the splenic flexure and descending colon as well. Normal appendix Vascular/Lymphatic: Aortic atherosclerosis. No enlarged abdominal or pelvic lymph nodes. Reproductive: Uterus and bilateral adnexa are unremarkable. Other: Negative for free air or free fluid. Musculoskeletal: Degenerative changes. No acute or suspicious lesion IMPRESSION: 1. Focal wall thickening with moderate surrounding inflammation at the sigmoid colon, thought previously related to diverticulitis. There now appears to be mild colon wall thickening at the splenic flexure and descending colon, therefore consider colitis, secondary to infectious or inflammatory bowel disease. There is no perforation or drainable abscess. 2. Atrophic right kidney as before. Electronically Signed   By:  Donavan Foil M.D.   On: 06/03/2017 01:11     EKG:   Not done in ED, will get one.   Assessment/Plan Principal Problem:   Diverticulitis of colon Active Problems:   Essential hypertension   OSA (obstructive sleep apnea)   Chronic respiratory failure with hypoxia (HCC)   Morbid (severe) obesity due to excess calories (HCC)   HLD (hyperlipidemia)   Stroke (cerebrum) (HCC)   GERD (gastroesophageal reflux disease)   Anxiety   Chronic combined systolic (congestive) and diastolic (congestive) heart failure (HCC)   Hypokalemia   Diverticulitis large intestine   Nausea vomiting and diarrhea   Lower abdominal pain   Recurrent diverticulitis/colitis: pt's nausea, vomiting, diarrhea and lower abdominal pain are due to recurrent diverticulitis/colitis. CT scan showed sigmoid colon diverticulitis and possible colitis in splenic flexure and descending colon. No perforation or drainable abscess. Pt does not have fever or leukocytosis. Clinically nonseptic. Hemodynamically stable.  -will admit to tele bed as inpt - liquid diet as tolerated  - prn dilaudid for pain  - prn Zofran for nausea  - Will check C diff pcr - Blood culture x 2 - IV antibiotics: Flagyl plus Cipro - May need to consult to GI and general surgery for hemicolectomy  HTN:  -Continue home medications: Amlodipine, metoprolol -IV hydralazine prn  OSA: -CPAP  Chronic respiratory failure with hypoxia ([place]): on 4L nasal cannula oxygen at home. -When necessary albuterol nebs -Dulera inhaler  HLD (hyperlipidemia): -lipitor  Hx of stroke: -asa and lipitor  GERD: -Protonix  Anxiety: -continue home prn Klonopin  Hypokalemia: K=3.4 on admission. - Repleted - Check Mg level  Chronic combined systolic (congestive) and diastolic (congestive) heart failure ([place]): 2-D echo on 01/12/17 showed EF 45-50% with grade 1 diastolic dysfunction. Patient does not have leg edema or  JVD. CHF is compensated. -Hold Lasix -Check  BNP -Continue aspirin and metoprolol  Possible UTI: pt has burning on urination. Urinalysis positive with small amount of leukocyte -On IV antibiotics as above which should cover -Follow-up urine culture   DVT ppx: SQ Lovenox Code Status: Full code Family Communication: None at bed side. Disposition Plan:  Anticipate discharge back to previous home environment Consults called: none  Admission status:   Inpatient/tele       Date of Service 06/03/2017    Ivor Costa [facility] Pager [phone]  If 7PM-7AM, please contact night-coverage [email or web address] Password [email or web address] 06/03/2017, 2:25 AM

## 2017-06-03 NOTE — ED Notes (Signed)
Bed: WA12 Expected date:  Expected time:  Means of arrival:  Comments: Room 1  

## 2017-06-03 NOTE — Progress Notes (Signed)
Pharmacy Antibiotic Note  Ann [name] is a 61 y.o. female with abdominal pain admitted on 06/02/2017 with diverticulitis.  Pharmacy has been consulted for cipro dosing.  Plan: Cipro 400 mg IV q12h Flagyl 500 mg IV q8h (MD) F/u scr/cultures  Height: 5\' 2"  (157.5 cm) Weight: 222 lb (100.7 kg) IBW/kg (Calculated) : 50.1  Temp (24hrs), Avg:98.3 F (36.8 C), Min:98.3 F (36.8 C), Max:98.3 F (36.8 C)  Recent Labs  Lab 05/27/17 0558 05/28/17 0540 06/02/17 2300 06/03/17 0040  WBC 7.3 7.5 6.3  --   CREATININE 1.07* 0.84 1.01*  --   LATICACIDVEN  --   --   --  0.51    Estimated Creatinine Clearance: 66.6 mL/min (A) (by C-G formula based on SCr of 1.01 mg/dL (H)).    Allergies  Allergen Reactions  . Lisinopril Cough  . Doxycycline Diarrhea    Severe headaches  . Metronidazole Other (See Comments)    Severe headaches    Antimicrobials this admission: 11/28 cipro >>  11/28 flagyl >>   Dose adjustments this admission:   Microbiology results:  BCx:   UCx:    Sputum:    MRSA PCR:   Thank you for allowing pharmacy to be a part of this patient's care.  Dorrene German 06/03/2017 2:16 AM

## 2017-06-03 NOTE — ED Notes (Addendum)
Pt aware that another urine sample is needed.  Pt unable at this time. Pt just went to restroom before orders were put in for urine culture.

## 2017-06-03 NOTE — Consult Note (Signed)
Referring Provider: [facility]   Primary Care Physician:  Berkley Harvey, NP Primary Gastroenterologist:  Jolly Mango, MD  Reason for Consultation: abdominal pain, recent diverticulitis.    Attending physician's note   I have taken a history, examined the patient and reviewed the chart. I agree with the Advanced Practitioner's note, impression and recommendations. R/O C diff and refractory diverticulitis.   Lucio Edward, MD Mar[facility] Regal [phone] Mon-Fri 8a-5p [phone] after 5p, weekends, holidays   ASSESSMENT AND PLAN:    39.  61 yo female with recent admission for recurrent sigmoid diverticulitis refractory to outpatient management. Now readmitted with recurrent abdominal pain, vomiting, diarrhea and fever. She never really improved much following hospital discharge. Repeat CT scan now shows inflammation involving more diffuse area including splenic flexure and descending colon. She says her diarrhea has been much worse throughout this bout of diverticulitis. Need to rule out infection etiology, especially C-diff given antibiotic use.  -await stool studies, Continue antibiotics for diverticulitis in the interim. If stool studies negatives and she doesn't improve then Surgical evaluation as inpatient (she is supposed to see Dr. Ninfa [name] outpatient soon).  -IV hydration.  -Analgesics, anti-emetics   2. Recurrent solid food dysphagia. She has required esophageal dilations in past. We will address again following resolution of acute issues. Advised to chew well, small bites in the interim.   Marland Kitchen  HPI: Ann [name] is a 61 y.o. female with a hx hypertension, OSA on CPAP, chronic respiratory failure on 02 at home, and  prior CVA,   She has history of recurrent diverticulitis and was hospitalized 2 weeks ago for sigmoid diverticulitis refractory to outpatient management.  CT scan at that time showed sigmoid diverticulitis spanning over 12.5 cm.  No drainable abscess or free air was seen.  We  followed the patient during that admission.  She improved on antibiotics, wanted to go home because husband dying of colon cancer..  Repeat CT scan showed persistent wall thickening involving a 10 m segment of the sigmoid with pericolonic edema and inflammation similar to the study 5 days prior.  Plan was for outpatient surgical evaluation.   She went home with Augmentin. Never felt like much improvement then symptoms became worse again a couple of days ago. She has had temp of 101 at home. She has diffuse lower abdominal pain, constant.  Patient represented to the emergency department today with persistent nausea, vomiting, diarrhea and abdominal pain.  Labs are essentially unremarkable.  She is afebrile.  Repeat CT scan of the abdomen and pelvis today shows focal wall thickening with moderate surrounding inflammation at the sigmoid.  There now appears to be mild colon wall thickening at the splenic flexure and ascending colon suspicious for colitis.  Thurza has had diarrhea since Aug 4th (Dr. Ninfa [name]). She takes Questran which helps but doesn't alleviate it. Since having diverticuiltis the diarrhea has gotten much worse. Diarrhea contains blood and pus but she says it always does when she has diverticulitis.  She has lost about 20 pounds with the prolonged bout of diverticulitis. She had a colonoscopy in Feb of this year with findings of left sided diverticulitis and a small cecal polyp ( removed)  No dysuria but she has difficulty initiating stream. She also complains of recurrent solid dysphagia and has undergone EGD with dilations in the past. No other GI or general medical complaints  Past Medical History:  Diagnosis Date  . Adenoma of colon   . Allergy    seasonal  .  Anemia    with past pregnancy -not recent  . Anxiety   . Arthritis   . Borderline diabetes   . Chronic cystitis   . Complication of anesthesia    02-20-2014 ([facility]) INTRA-OP RESPIRATORY FAILURE SECONDARY TO POSSIBLE MUCOUS  ASPIRATION/  ALSO 06-06-2013 [facility]) POST-OP DESATURATION  EVEN USING CPAP, States some issues if Propofol given to rapidly.  . Diverticulitis   . Diverticulosis of colon   . Dyspnea   . Family history of adverse reaction to anesthesia    PER PT SISTER DIED AFTER GENERAL ANESTHESIA DUE TO UNDIAGNOSED OSA  . GERD (gastroesophageal reflux disease)   . H/O hiatal hernia   . Headache(784.0)    migraines, decreased since turning 50's  . History of chronic cough    "tight sounding cough"  . History of esophageal dilatation   . History of kidney stones   . History of MRSA infection    3/ 2015  AXILLARY ABSCESS  . History of recurrent UTIs   . History of TIAs NO RESIDUAL   1980;  2005;   2008 PT STATES PER CT  SCARRING RIGHT SIDE OF BRAIN  . Hyperlipidemia   . Hypertension   . Kidney disease    III  . Neuromuscular disorder ([place])    HH  . Neuropathy    pt denies this 07-05-2015  . Nocturia   . OSA on CPAP    PER SLEEP STUDY 09-08-2012  MODERATE OSA. not used in awhile, but thinks needs to start back using due to some weight gain.  . Sleep apnea   . Stroke [facility]) [phone]   TIA'S  . SUI (stress urinary incontinence, female)     Past Surgical History:  Procedure Laterality Date  . ANTERIOR AND POSTERIOR REPAIR N/A 06/06/2013   Procedure: Anterior vaginal vault repair, Sacrospinous ligament fixation with UPHOLD lite, Kelly plication, Sacrospinous mesh fixation, Solyx transurethral sling;  Surgeon: Ailene Rud, MD;  Location: [facility];  Service: Urology;  Laterality: N/A;  . CHOLECYSTECTOMY N/A 02/10/2017   Procedure: LAPAROSCOPIC CHOLECYSTECTOMY;  Surgeon: Coralie Keens, MD;  Location: [place];  Service: General;  Laterality: N/A;  . CYSTOSCOPY W/ URETERAL STENT PLACEMENT Right 04/21/2014   Procedure: CYSTOSCOPY WITH RETROGRADE PYELOGRAM/URETERAL STENT PLACEMENT;  Surgeon: Ailene Rud, MD;  Location: [facility];  Service: Urology;  Laterality: Right;   . CYSTOSCOPY W/ URETERAL STENT PLACEMENT Right 11/21/2014   Procedure: CYSTOSCOPY WITH RETROGRADE PYELOGRAM, URETEROSCOPY WITH STONE REMOVAL, URETERAL STENT PLACEMENT;  Surgeon: Carolan Clines, MD;  Location: [facility];  Service: Urology;  Laterality: Right;  . CYSTOSCOPY W/ URETERAL STENT PLACEMENT Left 02/01/2016   Procedure: CYSTO, LEFT URETEROSCOPY, LEFT RETROGRADE AND LEFT URETERAL STENT PLACEMENT;  Surgeon: Carolan Clines, MD;  Location: [facility];  Service: Urology;  Laterality: Left;  . CYSTOSCOPY WITH RETROGRADE PYELOGRAM, URETEROSCOPY AND STENT PLACEMENT Right 06/05/2014   Procedure: CYSTOSCOPY WITH RIGHT RETROGRADE PYELOGRAM, RIGHT URETEROSCOPY, STONE EXTRACTION AND RIGHT DOUBLE J STENT PLACEMENT;  Surgeon: Ailene Rud, MD;  Location: [facility];  Service: Urology;  Laterality: Right;  . ESOPHAGEAL DILATION  X2  LAST ONE  JUNE 2014   has had 9 of these  . ESOPHAGEAL MANOMETRY N/A 09/10/2015   Procedure: ESOPHAGEAL MANOMETRY (EM);  Surgeon: Manus Gunning, MD;  Location: [facility];  Service: Gastroenterology;  Laterality: N/A;  . HOLMIUM LASER APPLICATION Right 57/32/2025   Procedure: HOLMIUM LASER OF STONE ;  Surgeon: Ailene Rud, MD;  Location: [facility];  Service: Urology;  Laterality: Right;  . KNEE ARTHROSCOPY Bilateral 2008  . LAPAROSCOPIC CHOLECYSTECTOMY  02/10/2017  . MULTIPLE TOOTH EXTRACTIONS     past hx  . RECTOCELE REPAIR N/A 02/20/2014   Procedure: POSTERIOR REPAIR (RECTOCELE) WITH VAGINAL VAULT REPAIR WITH ACELL GRAFT PLACEMENT, PERINEOPLASTY, ENTEROCELE REPAIR;  Surgeon: Ailene Rud, MD;  Location: [facility];  Service: Urology;  Laterality: N/A;  . TUBAL LIGATION  1987    Prior to Admission medications   Medication Sig Start Date End Date Taking? Authorizing Provider  albuterol (PROAIR HFA) 108 (90 Base) MCG/ACT inhaler Inhale 2 puffs into the lungs every 4 (four) hours as needed. For cough wheezing.  08/07/15  Yes [provider]  albuterol (PROVENTIL) (2.5 MG/3ML) 0.083% nebulizer solution Inhale 3 mLs into the lungs every 6 (six) hours as needed. Wheezing/shortness of breath 08/07/15  Yes [provider]  amLODipine (NORVASC) 10 MG tablet Take 10 mg by mouth daily.    Yes [provider]  aspirin EC 81 MG tablet Take 81 mg daily by mouth.   Yes [provider]  atorvastatin (LIPITOR) 10 MG tablet Take 10 mg daily by mouth. 05/15/17  Yes [provider]  budesonide-formoterol (SYMBICORT) 160-4.5 MCG/ACT inhaler Inhale 2 puffs 2 (two) times daily into the lungs. 04/21/17 04/21/18 Yes [provider]  chlorpheniramine (CHLOR-TRIMETON) 4 MG tablet Take 1 tablet (4 mg total) by mouth at bedtime. 03/08/15  Yes Chesley Mires, MD  clonazePAM (KLONOPIN) 0.5 MG tablet Take 0.5 mg by mouth 2 (two) times daily as needed for anxiety.    Yes [provider]  dicyclomine (BENTYL) 20 MG tablet Take 20 mg by mouth 4 (four) times daily. 05/29/17  Yes [provider]  fluticasone (FLONASE) 50 MCG/ACT nasal spray Place 2 sprays daily into both nostrils. 08/07/15  Yes [provider]  furosemide (LASIX) 40 MG tablet Take 0.5 tablets (20 mg total) by mouth daily. 05/28/17  Yes Domenic Polite, MD  hyoscyamine (LEVBID) 0.375 MG 12 hr tablet Take 1 tablet (0.375 mg total) by mouth every 12 (twelve) hours as needed for cramping. 05/28/17  Yes Domenic Polite, MD  metoprolol succinate (TOPROL-XL) 50 MG 24 hr tablet Take 50 mg by mouth at bedtime.  08/08/15  Yes [provider]  omeprazole (PRILOSEC) 40 MG capsule TAKE 1 CAPSULE(40 MG) BY MOUTH TWICE DAILY 09/17/16  Yes Armbruster, [name], MD  oxyCODONE (ROXICODONE) 5 MG immediate release tablet Take 1 tablet (5 mg total) by mouth every 4 (four) hours as needed for severe pain. 05/28/17  Yes Domenic Polite, MD  polyethylene glycol [facility] Floria Raveling) packet Take 17 g by mouth daily as  needed for moderate constipation. 01/16/17  Yes Bonnielee Haff, MD  topiramate (TOPAMAX) 50 MG tablet Take 50 mg by mouth 2 (two) times daily.   Yes [provider]  OXYGEN Inhale into the lungs.    [provider]  UNABLE TO FIND Med Name: CPAP    [provider]    Current Facility-Administered Medications  Medication Dose Route Frequency Provider Last Rate Last Dose  . acetaminophen (TYLENOL) tablet 650 mg  650 mg Oral Q6H PRN Ivor Costa, MD      . albuterol (PROVENTIL) (2.5 MG/3ML) 0.083% nebulizer solution 3 mL  3 mL Inhalation Q4H PRN Ivor Costa, MD      . amLODipine (NORVASC) tablet 10 mg  10 mg Oral Daily Ivor Costa, MD   10 mg at 06/03/17 1055  .  aspirin EC tablet 81 mg  81 mg Oral Daily Ivor Costa, MD   81 mg at 06/03/17 1055  . atorvastatin (LIPITOR) tablet 10 mg  10 mg Oral Daily Ivor Costa, MD   10 mg at 06/03/17 1055  . chlorhexidine (PERIDEX) 0.12 % solution 15 mL  15 mL Mouth Rinse BID Oswald Hillock, MD   15 mL at 06/03/17 1214  . ciprofloxacin (CIPRO) IVPB 400 mg  400 mg Intravenous Q12H Dorrene German, [facility]   Stopped at 06/03/17 1201  . clonazePAM (KLONOPIN) tablet 0.5 mg  0.5 mg Oral BID PRN Ivor Costa, MD      . dicyclomine (BENTYL) tablet 20 mg  20 mg Oral QID Ivor Costa, MD   20 mg at 06/03/17 1528  . diphenhydrAMINE (BENADRYL) capsule 25 mg  25 mg Oral QHS PRN Ivor Costa, MD      . enoxaparin (LOVENOX) injection 40 mg  40 mg Subcutaneous Q24H Ivor Costa, MD   40 mg at 06/03/17 1056  . feeding supplement (ENSURE ENLIVE) (ENSURE ENLIVE) liquid 237 mL  237 mL Oral BID BM Darrick Meigs, Marge Duncans, MD      . fluticasone (FLONASE) 50 MCG/ACT nasal spray 2 spray  2 spray Each Nare Daily Ivor Costa, MD   2 spray at 06/03/17 1055  . hydrALAZINE (APRESOLINE) injection 5 mg  5 mg Intravenous Q2H PRN Ivor Costa, MD      . HYDROmorphone (DILAUDID) injection 1 mg  1 mg Intravenous Q4H PRN Ivor Costa, MD   1 mg at 06/03/17 1528  . hyoscyamine (LEVBID) 0.375 MG 12 hr  tablet 0.375 mg  0.375 mg Oral Q12H PRN Ivor Costa, MD   0.375 mg at 06/03/17 1610  . MEDLINE mouth rinse  15 mL Mouth Rinse q12n4p Darrick Meigs, Marge Duncans, MD      . metoprolol succinate (TOPROL-XL) 24 hr tablet 50 mg  50 mg Oral QHS Ivor Costa, MD   50 mg at 06/03/17 0513  . metroNIDAZOLE (FLAGYL) IVPB 500 mg  500 mg Intravenous Cleophas Dunker, MD   Stopped at 06/03/17 [phone]  . mometasone-formoterol (DULERA) 200-5 MCG/ACT inhaler 2 puff  2 puff Inhalation BID Ivor Costa, MD      . ondansetron [facility]) injection 4 mg  4 mg Intravenous Q8H PRN Ivor Costa, MD   4 mg at 06/03/17 0950  . oxyCODONE (Oxy IR/ROXICODONE) immediate release tablet 5 mg  5 mg Oral Q4H PRN Ivor Costa, MD   5 mg at 06/03/17 0653  . pantoprazole (PROTONIX) EC tablet 40 mg  40 mg Oral Daily Ivor Costa, MD   40 mg at 06/03/17 1055  . promethazine (PHENERGAN) tablet 25 mg  25 mg Oral Q8H PRN Ivor Costa, MD   25 mg at 06/03/17 0653  . sodium chloride flush (NS) 0.9 % injection 10-40 mL  10-40 mL Intracatheter PRN Oswald Hillock, MD      . topiramate (TOPAMAX) tablet 50 mg  50 mg Oral BID Ivor Costa, MD   50 mg at 06/03/17 1055  . zolpidem (AMBIEN) tablet 5 mg  5 mg Oral QHS PRN Ivor Costa, MD        Allergies as of 06/02/2017 - Review Complete 06/02/2017  Allergen Reaction Noted  . Lisinopril Cough 05/18/2017  . Doxycycline Diarrhea 08/13/2016  . Metronidazole Other (See Comments) 02/09/2015    Family History  Problem Relation Age of Onset  . Colon cancer Father 22       died at  27  . Heart disease Mother        died at 40  . Heart attack Mother   . Heart attack Sister        died at 32  . Hypertension Brother   . Heart attack Cousin 32       with death  . Heart attack Cousin 56       died with MI  . Esophageal cancer Neg Hx   . Rectal cancer Neg Hx   . Stomach cancer Neg Hx   . Colon polyps Neg Hx     Social History   Socioeconomic History  . Marital status: Married    Spouse name: Herbie Baltimore  . Number of children: 3    . Years of education: 42  . Highest education level: Not on file  Social Needs  . Financial resource strain: Not on file  . Food insecurity - worry: Not on file  . Food insecurity - inability: Not on file  . Transportation needs - medical: Not on file  . Transportation needs - non-medical: Not on file  Occupational History  . Occupation: Press photographer  Tobacco Use  . Smoking status: Never Smoker  . Smokeless tobacco: Never Used  Substance and Sexual Activity  . Alcohol use: No    Alcohol/week: 0.0 oz  . Drug use: No  . Sexual activity: No    Birth control/protection: Post-menopausal  Other Topics Concern  . Not on file  Social History Narrative   Patient lives at home with her husband 19 years. Herbie Baltimore). Patient has a 12th grade education.    Right handed.    Caffeine - two daily.    Review of Systems: All systems reviewed and negative except where noted in HPI.  Physical Exam: Vital signs in last 24 hours: Temp:  [98 F (36.7 C)-98.3 F (36.8 C)] 98 F (36.7 C) (11/28 1621) Pulse Rate:  [52-86] 65 (11/28 1621) Resp:  [11-23] 20 (11/28 1621) BP: (105-158)/(66-98) 125/66 (11/28 1621) SpO2:  [91 %-100 %] 93 % (11/28 1621) Weight:  [222 lb (100.7 kg)] 222 lb (100.7 kg) (11/27 2147) Last BM Date: 06/03/17 General:   Alert, well-developed, white female in NAD Psych:  Pleasant, cooperative. Normal mood and affect. Eyes:  Pupils equal, sclera clear, no icterus.   Conjunctiva pink. Ears:  Normal auditory acuity. Nose:  No deformity, discharge,  or lesions. Neck:  Supple; no masses Lungs:  Clear throughout to auscultation.   No wheezes, crackles, or rhonchi.  Heart:  Regular rate and rhythm; no murmurs, no edema Abdomen:  Soft, non-distended, mild LLQ tenderness. BS active, no palp mass    Rectal:  Deferred  Msk:  Symmetrical without gross deformities. . Pulses:  Normal pulses noted. Neurologic:  Alert and  oriented x4;  grossly normal neurologically. Skin:  Intact without  significant lesions or rashes..   Intake/Output from previous day: 11/27 0701 - 11/28 0700 In: 200 [IV Piggyback:200] Out: -  Intake/Output this shift: Total I/O In: 800 [P.O.:600; IV Piggyback:200] Out: 100 [Urine:100]  Lab Results: Recent Labs    06/02/17 2300 06/03/17 0550  WBC 6.3 5.9  HGB 12.6 12.1  HCT 38.3 37.4  PLT 450* 413*   BMET Recent Labs    06/02/17 2300 06/03/17 0550  NA 143 141  K 3.4* 3.6  CL 111 110  CO2 22 24  GLUCOSE 112* 98  BUN 11 9  CREATININE 1.01* 0.89  CALCIUM 8.7* 8.4*   LFT Recent Labs  06/02/17 2300  PROT 7.5  ALBUMIN 3.5  AST 17  ALT 16  ALKPHOS 62  BILITOT 0.4   PT/INR No results for input(s): LABPROT, INR in the last 72 hours. Hepatitis Panel No results for input(s): HEPBSAG, HCVAB, HEPAIGM, HEPBIGM in the last 72 hours.    Studies/Results: Ct Abdomen Pelvis W Contrast  Result Date: 06/03/2017 CLINICAL DATA:  Abdominal pain EXAM: CT ABDOMEN AND PELVIS WITH CONTRAST TECHNIQUE: Multidetector CT imaging of the abdomen and pelvis was performed using the standard protocol following bolus administration of intravenous contrast. CONTRAST:  100 mL Isovue-300 intravenous COMPARISON:  05/23/2017, 03/08/2017, 12/02/2016, 12/31/2015 FINDINGS: Lower chest: No acute abnormality. Hepatobiliary: No focal liver abnormality is seen. Status post cholecystectomy. No biliary dilatation. Pancreas: Unremarkable. No pancreatic ductal dilatation or surrounding inflammatory changes. Spleen: Normal in size without focal abnormality. Adrenals/Urinary Tract: Adrenal glands are within normal limits. Stable prominent right renal pelvis. Atrophic right kidney. subcentimeter cortical hypodensity in the right kidney too small to further characterize. Bladder normal Stomach/Bowel: Stomach is nonenlarged. No dilated small bowel. Wall thickening and surrounding inflammation at the sigmoid colon. This does not appear significantly changed. Colon wall thickening  now appears to involve the splenic flexure and descending colon as well. Normal appendix Vascular/Lymphatic: Aortic atherosclerosis. No enlarged abdominal or pelvic lymph nodes. Reproductive: Uterus and bilateral adnexa are unremarkable. Other: Negative for free air or free fluid. Musculoskeletal: Degenerative changes. No acute or suspicious lesion IMPRESSION: 1. Focal wall thickening with moderate surrounding inflammation at the sigmoid colon, thought previously related to diverticulitis. There now appears to be mild colon wall thickening at the splenic flexure and descending colon, therefore consider colitis, secondary to infectious or inflammatory bowel disease. There is no perforation or drainable abscess. 2. Atrophic right kidney as before. Electronically Signed   By: Donavan Foil M.D.   On: 06/03/2017 01:11     Tye Savoy, NP-C @  06/03/2017, 4:27 PM  Pager number [phone]

## 2017-06-03 NOTE — Progress Notes (Signed)
Pt refused CPAP qhs.  Pt states that she does not want to use CPAP tonight as her head hurts and she feels nauseas.  Pt encouraged to contact RT if she changes her mind and voiced her understanding that RT would check again tomorrow night to see if she wanted CPAP then.

## 2017-06-03 NOTE — ED Provider Notes (Signed)
[facility] DEPT Provider Note   CSN: [id number] Arrival date & time: 06/02/17  2134     History   Chief Complaint Chief Complaint  Patient presents with  . Abdominal Pain    HPI Ann [name] is a 61 y.o. female with a hx of anxiety, recurrent diverticulitis, GERD, HTN, TIA presents to the Emergency Department complaining of persistent, progressively worsening left lower quadrant abdominal pain onset several weeks ago.  Patient was evaluated in the emergency department on 05/18/2017 and admitted for IV antibiotics and complicated diverticulitis until 05/28/2017.  She was discharged home for Thanksgiving but told to return if symptoms worsened.  She reports over the last several days her pain has increased significantly.  She has had associated subjective fevers at home with nausea, vomiting and diarrhea.  Per the record patient was on Zosyn for approximately 1 week and switched back to IV Cipro and Flagyl and then discharged home with oral Cipro and Flagyl.  No specific alleviating factors.  Patient reports eating makes her pain significantly worse.  She denies headache, neck pain, chest pain, shortness of breath, weakness, dizziness, syncope.    The history is provided by the patient and medical records. No language interpreter was used.    Past Medical History:  Diagnosis Date  . Adenoma of colon   . Allergy    seasonal  . Anemia    with past pregnancy -not recent  . Anxiety   . Arthritis   . Borderline diabetes   . Chronic cystitis   . Complication of anesthesia    02-20-2014 ([facility]) INTRA-OP RESPIRATORY FAILURE SECONDARY TO POSSIBLE MUCOUS ASPIRATION/  ALSO 06-06-2013 [facility]) POST-OP DESATURATION  EVEN USING CPAP, States some issues if Propofol given to rapidly.  . Diverticulitis   . Diverticulosis of colon   . Dyspnea   . Family history of adverse reaction to anesthesia    PER PT SISTER DIED AFTER GENERAL ANESTHESIA DUE TO UNDIAGNOSED OSA  .  GERD (gastroesophageal reflux disease)   . H/O hiatal hernia   . Headache(784.0)    migraines, decreased since turning 50's  . History of chronic cough    "tight sounding cough"  . History of esophageal dilatation   . History of kidney stones   . History of MRSA infection    3/ 2015  AXILLARY ABSCESS  . History of recurrent UTIs   . History of TIAs NO RESIDUAL   1980;  2005;   2008 PT STATES PER CT  SCARRING RIGHT SIDE OF BRAIN  . Hyperlipidemia   . Hypertension   . Kidney disease    III  . Neuromuscular disorder ([place])    HH  . Neuropathy    pt denies this 07-05-2015  . Nocturia   . OSA on CPAP    PER SLEEP STUDY 09-08-2012  MODERATE OSA. not used in awhile, but thinks needs to start back using due to some weight gain.  . Sleep apnea   . Stroke [facility]) [phone]   TIA'S  . SUI (stress urinary incontinence, female)     Patient Active Problem List   Diagnosis Date Noted  . HLD (hyperlipidemia) 06/03/2017  . 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 GERD (gastroesophageal reflux disease) 06/03/2017  . Anxiety 06/03/2017  . Chronic combined systolic (congestive) and diastolic (congestive) heart failure ([place]) 06/03/2017  . Hypokalemia 06/03/2017  . Abnormal CT of the abdomen   . Generalized abdominal pain   . LFT elevation   . Diverticulitis of  colon 05/18/2017  . Symptomatic cholelithiasis 02/10/2017  . Hypoxia 01/11/2017  . Dyspnea 01/11/2017  . Dysphagia 04/04/2015  . History of esophageal stricture 04/04/2015  . Morbid (severe) obesity due to excess calories ([place]) 04/01/2015  . ACE-inhibitor cough 03/12/2015  . Upper airway cough syndrome 03/12/2015  . Laryngopharyngeal reflux (LPR) 03/12/2015  . Elevated lipids 07/18/2014  . Chest pain 07/18/2014  . Internal hemorrhoids 04/25/2014  . Ureteral stone with hydronephrosis 04/22/2014  . Post-op pain 04/21/2014  . Rectocele, grade 3 02/20/2014  . Female rectocele without uterine prolapse 08/04/2013  . Unspecified  constipation 07/23/2013  . Chronic respiratory failure with hypoxia ([place]) 06/07/2013  . Cystocele 06/06/2013  . Anosmia 01/25/2013  . Unspecified nutritional deficiency   . Polyneuropathy in other diseases classified elsewhere ([place])   . OSA (obstructive sleep apnea)   . Cough 07/27/2012  . Essential hypertension     Past Surgical History:  Procedure Laterality Date  . ANTERIOR AND POSTERIOR REPAIR N/A 06/06/2013   Procedure: Anterior vaginal vault repair, Sacrospinous ligament fixation with UPHOLD lite, Kelly plication, Sacrospinous mesh fixation, Solyx transurethral sling;  Surgeon: Ailene Rud, MD;  Location: [facility];  Service: Urology;  Laterality: N/A;  . CHOLECYSTECTOMY N/A 02/10/2017   Procedure: LAPAROSCOPIC CHOLECYSTECTOMY;  Surgeon: Coralie Keens, MD;  Location: [facility];  Service: General;  Laterality: N/A;  . CYSTOSCOPY W/ URETERAL STENT PLACEMENT Right 04/21/2014   Procedure: CYSTOSCOPY WITH RETROGRADE PYELOGRAM/URETERAL STENT PLACEMENT;  Surgeon: Ailene Rud, MD;  [facility];  Service: Urology;  Laterality: Right;  . CYSTOSCOPY W/ URETERAL STENT PLACEMENT Right 11/21/2014   Procedure: CYSTOSCOPY WITH RETROGRADE PYELOGRAM, URETEROSCOPY WITH STONE REMOVAL, URETERAL STENT PLACEMENT;  Surgeon: Carolan Clines, MD;  [facility];  Service: Urology;  Laterality: Right;  . CYSTOSCOPY W/ URETERAL STENT PLACEMENT Left 02/01/2016   Procedure: CYSTO, LEFT URETEROSCOPY, LEFT RETROGRADE AND LEFT URETERAL STENT PLACEMENT;  Surgeon: Carolan Clines, MD;  Location[facility];  Service: Urology;  Laterality: Left;  . CYSTOSCOPY WITH RETROGRADE PYELOGRAM, URETEROSCOPY AND STENT PLACEMENT Right 06/05/2014   Procedure: CYSTOSCOPY WITH RIGHT RETROGRADE PYELOGRAM, RIGHT URETEROSCOPY, STONE EXTRACTION AND RIGHT DOUBLE J STENT PLACEMENT;  Surgeon: Ailene Rud, MD;  Location: [facility];  Service: Urology;  Laterality: Right;  .  ESOPHAGEAL DILATION  X2  LAST ONE  JUNE 2014   has had 9 of these  . ESOPHAGEAL MANOMETRY N/A 09/10/2015   Procedure: ESOPHAGEAL MANOMETRY (EM);  Surgeon: Manus Gunning, MD;  [facility];  Service: Gastroenterology;  Laterality: N/A;  . HOLMIUM LASER APPLICATION Right 46/96/2952   Procedure: HOLMIUM LASER OF STONE ;  Surgeon: Ailene Rud, MD;  Location: [facility];  Service: Urology;  Laterality: Right;  . KNEE ARTHROSCOPY Bilateral 2008  . LAPAROSCOPIC CHOLECYSTECTOMY  02/10/2017  . MULTIPLE TOOTH EXTRACTIONS     past hx  . RECTOCELE REPAIR N/A 02/20/2014   Procedure: POSTERIOR REPAIR (RECTOCELE) WITH VAGINAL VAULT REPAIR WITH ACELL GRAFT PLACEMENT, PERINEOPLASTY, ENTEROCELE REPAIR;  Surgeon: Ailene Rud, MD;  Location[facility];  Service: Urology;  Laterality: N/A;  . TUBAL LIGATION  1987    OB History    No data available       Home Medications    Prior to Admission medications   Medication Sig Start Date End Date Taking? Authorizing Provider  albuterol (PROAIR HFA) 108 (90 Base) MCG/ACT inhaler Inhale 2 puffs into the lungs every 4 (four) hours as needed. For cough wheezing.  08/07/15  Yes [provider]  albuterol (PROVENTIL) (2.5 MG/3ML) 0.083% nebulizer solution Inhale 3 mLs into the lungs every 6 (six) hours as needed. Wheezing/shortness of breath 08/07/15  Yes [provider]  amLODipine (NORVASC) 10 MG tablet Take 10 mg by mouth daily.    Yes [provider]  aspirin EC 81 MG tablet Take 81 mg daily by mouth.   Yes [provider]  atorvastatin (LIPITOR) 10 MG tablet Take 10 mg daily by mouth. 05/15/17  Yes [provider]  budesonide-formoterol (SYMBICORT) 160-4.5 MCG/ACT inhaler Inhale 2 puffs 2 (two) times daily into the lungs. 04/21/17 04/21/18 Yes [provider]  chlorpheniramine (CHLOR-TRIMETON) 4 MG tablet Take 1 tablet (4 mg total) by mouth at bedtime. 03/08/15  Yes  Chesley Mires, MD  clonazePAM (KLONOPIN) 0.5 MG tablet Take 0.5 mg by mouth 2 (two) times daily as needed for anxiety.    Yes [provider]  dicyclomine (BENTYL) 20 MG tablet Take 20 mg by mouth 4 (four) times daily. 05/29/17  Yes [provider]  fluticasone (FLONASE) 50 MCG/ACT nasal spray Place 2 sprays daily into both nostrils. 08/07/15  Yes [provider]  furosemide (LASIX) 40 MG tablet Take 0.5 tablets (20 mg total) by mouth daily. 05/28/17  Yes Domenic Polite, MD  hyoscyamine (LEVBID) 0.375 MG 12 hr tablet Take 1 tablet (0.375 mg total) by mouth every 12 (twelve) hours as needed for cramping. 05/28/17  Yes Domenic Polite, MD  metoprolol succinate (TOPROL-XL) 50 MG 24 hr tablet Take 50 mg by mouth at bedtime.  08/08/15  Yes [provider]  omeprazole (PRILOSEC) 40 MG capsule TAKE 1 CAPSULE(40 MG) BY MOUTH TWICE DAILY 09/17/16  Yes Armbruster, [name], MD  oxyCODONE (ROXICODONE) 5 MG immediate release tablet Take 1 tablet (5 mg total) by mouth every 4 (four) hours as needed for severe pain. 05/28/17  Yes Domenic Polite, MD  polyethylene glycol [facility] / Floria Raveling) packet Take 17 g by mouth daily as needed for moderate constipation. 01/16/17  Yes Bonnielee Haff, MD  topiramate (TOPAMAX) 50 MG tablet Take 50 mg by mouth 2 (two) times daily.   Yes [provider]  OXYGEN Inhale into the lungs.    [provider]  UNABLE TO FIND Med Name: CPAP    [provider]    Family History Family History  Problem Relation Age of Onset  . Colon cancer Father 19       died at 48  . Heart disease Mother        died at 16  . Heart attack Mother   . Heart attack Sister        died at 40  . Hypertension Brother   . Heart attack Cousin 27       with death  . Heart attack Cousin 36       died with MI  . Esophageal cancer Neg Hx   . Rectal cancer Neg Hx   . Stomach cancer Neg Hx   . Colon polyps Neg Hx     Social History Social  History   Tobacco Use  . Smoking status: Never Smoker  . Smokeless tobacco: Never Used  Substance Use Topics  . Alcohol use: No    Alcohol/week: 0.0 oz  . Drug use: No     Allergies   Lisinopril; Doxycycline; and Metronidazole   Review of Systems Review of Systems  Constitutional: Positive for fever ( subjective). Negative for appetite change, diaphoresis, fatigue  and unexpected weight change.  HENT: Negative for mouth sores.   Eyes: Negative for visual disturbance.  Respiratory: Negative for cough, chest tightness, shortness of breath and wheezing.   Cardiovascular: Negative for chest pain.  Gastrointestinal: Positive for abdominal pain, diarrhea, nausea and vomiting. Negative for constipation.  Endocrine: Negative for polydipsia, polyphagia and polyuria.  Genitourinary: Negative for dysuria, frequency, hematuria and urgency.  Musculoskeletal: Negative for back pain and neck stiffness.  Skin: Negative for rash.  Allergic/Immunologic: Negative for immunocompromised state.  Neurological: Negative for syncope, light-headedness and headaches.  Hematological: Does not bruise/bleed easily.  Psychiatric/Behavioral: Negative for sleep disturbance. The patient is not nervous/anxious.      Physical Exam Updated Vital Signs BP (!) 158/94 (BP Location: Left Arm)   Pulse 66   Temp 98.3 F (36.8 C) (Oral)   Resp 20   Ht 5\' 2"  (1.575 m)   Wt 100.7 kg (222 lb)   SpO2 94%   BMI 40.60 kg/m   Physical Exam  Constitutional: She appears well-developed and well-nourished. No distress.  Awake, alert, nontoxic appearance  HENT:  Head: Normocephalic and atraumatic.  Mouth/Throat: Oropharynx is clear and moist. No oropharyngeal exudate.  Eyes: Conjunctivae are normal. No scleral icterus.  Neck: Normal range of motion. Neck supple.  Cardiovascular: Normal rate, regular rhythm and intact distal pulses.  Pulmonary/Chest: Effort normal and breath sounds normal. No respiratory distress.  She has no wheezes.  Equal chest expansion  Abdominal: Soft. Bowel sounds are normal. She exhibits no mass. There is tenderness in the periumbilical area, suprapubic area and left lower quadrant. There is no rebound and no guarding.  Musculoskeletal: Normal range of motion. She exhibits no edema.  Neurological: She is alert.  Speech is clear and goal oriented Moves extremities without ataxia  Skin: Skin is warm and dry. She is not diaphoretic.  Psychiatric: She has a normal mood and affect.  Nursing note and vitals reviewed.    ED Treatments / Results  Labs (all labs ordered are listed, but only abnormal results are displayed) Labs Reviewed  COMPREHENSIVE METABOLIC PANEL - Abnormal; Notable for the following components:      Result Value   Potassium 3.4 (*)    Glucose, Bld 112 (*)    Creatinine, Ser 1.01 (*)    Calcium 8.7 (*)    GFR calc non Af Amer 60 (*)    All other components within normal limits  CBC - Abnormal; Notable for the following components:   RBC 3.82 (*)    MCV 100.3 (*)    Platelets 450 (*)    All other components within normal limits  URINALYSIS, ROUTINE W REFLEX MICROSCOPIC - Abnormal; Notable for the following components:   APPearance CLOUDY (*)    Leukocytes, UA SMALL (*)    Bacteria, UA RARE (*)    Squamous Epithelial / LPF 6-30 (*)    All other components within normal limits  LIPASE, BLOOD  I-STAT BETA HCG BLOOD, ED ([facility], [facility], [facility] ONLY)  I-STAT CG4 LACTIC ACID, ED    EKG  EKG Interpretation None       Radiology Ct Abdomen Pelvis W Contrast  Result Date: 06/03/2017 CLINICAL DATA:  Abdominal pain EXAM: CT ABDOMEN AND PELVIS WITH CONTRAST TECHNIQUE: Multidetector CT imaging of the abdomen and pelvis was performed using the standard protocol following bolus administration of intravenous contrast. CONTRAST:  100 mL Isovue-300 intravenous COMPARISON:  05/23/2017, 03/08/2017, 12/02/2016, 12/31/2015 FINDINGS: Lower chest: No acute abnormality.  Hepatobiliary: No focal liver abnormality  is seen. Status post cholecystectomy. No biliary dilatation. Pancreas: Unremarkable. No pancreatic ductal dilatation or surrounding inflammatory changes. Spleen: Normal in size without focal abnormality. Adrenals/Urinary Tract: Adrenal glands are within normal limits. Stable prominent right renal pelvis. Atrophic right kidney. subcentimeter cortical hypodensity in the right kidney too small to further characterize. Bladder normal Stomach/Bowel: Stomach is nonenlarged. No dilated small bowel. Wall thickening and surrounding inflammation at the sigmoid colon. This does not appear significantly changed. Colon wall thickening now appears to involve the splenic flexure and descending colon as well. Normal appendix Vascular/Lymphatic: Aortic atherosclerosis. No enlarged abdominal or pelvic lymph nodes. Reproductive: Uterus and bilateral adnexa are unremarkable. Other: Negative for free air or free fluid. Musculoskeletal: Degenerative changes. No acute or suspicious lesion IMPRESSION: 1. Focal wall thickening with moderate surrounding inflammation at the sigmoid colon, thought previously related to diverticulitis. There now appears to be mild colon wall thickening at the splenic flexure and descending colon, therefore consider colitis, secondary to infectious or inflammatory bowel disease. There is no perforation or drainable abscess. 2. Atrophic right kidney as before. Electronically Signed   By: Donavan Foil M.D.   On: 06/03/2017 01:11    Procedures Procedures (including critical care time)  Medications Ordered in ED Medications  ciprofloxacin (CIPRO) IVPB 400 mg (400 mg Intravenous New Bag/Given 06/03/17 0116)  metroNIDAZOLE (FLAGYL) IVPB 500 mg (not administered)  morphine 4 MG/ML injection 4 mg (4 mg Intravenous Given 06/03/17 0027)  morphine 4 MG/ML injection (  Given 06/03/17 0028)  iopamidol (ISOVUE-300) 61 % injection 100 mL (100 mLs Intravenous Contrast Given  06/03/17 0038)     Initial Impression / Assessment and Plan / ED Course  I have reviewed the triage vital signs and the nursing notes.  Pertinent labs & imaging results that were available during my care of the patient were reviewed by me and considered in my medical decision making (see chart for details).  Clinical Course as of Jun 03 141  Wed Jun 03, 2017  0141 Discussed with Dr. Blaine Hamper who will admit  [HM]    Clinical Course User Index [HM] Amera Banos, Jarrett [place], [place]    Patient presents with worsening of her diverticulitis.  Concern for possible development of abscess.  Will give IV Cipro and Flagyl and obtain CT scan.  White blood cell count is 6.3.  No anemia.  Mild hypokalemia, will replace.  No elevation in transaminase or lipase.  Lactic acid normal.  CT scan with questionable colitis versus ulcerative colitis.  She continues to have thickened bowel wall.  Discussed with hospitalist who will admit and consult general surgery in the morning as they have been following along with the patient.    Final Clinical Impressions(s) / ED Diagnoses   Final diagnoses:  Diverticulitis  Nausea vomiting and diarrhea  LLQ abdominal pain    ED Discharge Orders    None       Loni Muse Gwenlyn Perking 06/03/17 0142    Ripley Fraise, MD 06/03/17 [phone]

## 2017-06-03 NOTE — Progress Notes (Signed)
Subjective: Patient admitted this morning, see detailed H&P by Dr Blaine Hamper  61 y.o. female with medical history significant of recurrent diverticulitis, hypertension, hyperlipidemia, stroke, TIA, GERD, anxiety, OSA on CPAP, obesity, CHF with EF of 45-50 percent, chronic respiratory failure on 4 L oxygen at home, who presents with nausea, vomiting, diarrhea and lower abdominal pain.  Patient was just discharged from the hospital on 05/28/2017.  She was supposed to follow-up surgery for possible hemicolectomy as outpatient.   Vitals:   06/03/17 1047 06/03/17 1621  BP: 122/68 125/66  Pulse: 60 65  Resp: 20 20  Temp: 98.3 F (36.8 C) 98 F (36.7 C)  SpO2: 96% 93%      A/P  Recurrent diverticulitis/colitis Hypertension Chronic respiratory failure with hypoxia  CT abdomen shows diffuse colitis involving the descending colon C. difficile PCR is pending We will consult gastroenterology for further recommendations   [place] Hospital[facility] Pager- [phone]

## 2017-06-04 DIAGNOSIS — R1030 Lower abdominal pain, unspecified: Secondary | ICD-10-CM

## 2017-06-04 DIAGNOSIS — R112 Nausea with vomiting, unspecified: Secondary | ICD-10-CM

## 2017-06-04 LAB — CBC
HCT: 37 % (ref 36.0–46.0)
Hemoglobin: 11.9 g/dL — ABNORMAL LOW (ref 12.0–15.0)
MCH: 32.9 pg (ref 26.0–34.0)
MCHC: 32.2 g/dL (ref 30.0–36.0)
MCV: 102.2 fL — ABNORMAL HIGH (ref 78.0–100.0)
Platelets: 441 10*3/uL — ABNORMAL HIGH (ref 150–400)
RBC: 3.62 MIL/uL — ABNORMAL LOW (ref 3.87–5.11)
RDW: 14.2 % (ref 11.5–15.5)
WBC: 6.4 10*3/uL (ref 4.0–10.5)

## 2017-06-04 LAB — BASIC METABOLIC PANEL
Anion gap: 5 (ref 5–15)
BUN: 7 mg/dL (ref 6–20)
CHLORIDE: 111 mmol/L (ref 101–111)
CO2: 25 mmol/L (ref 22–32)
CREATININE: 1 mg/dL (ref 0.44–1.00)
Calcium: 9 mg/dL (ref 8.9–10.3)
GFR calc Af Amer: >60 mL/min (ref >60)
GFR calc non Af Amer: >60 mL/min (ref >60)
GLUCOSE: 103 mg/dL — AB (ref 65–99)
Potassium: 4.2 mmol/L (ref 3.5–5.1)
SODIUM: 141 mmol/L (ref 135–145)

## 2017-06-04 LAB — URINE CULTURE

## 2017-06-04 MED ORDER — SACCHAROMYCES BOULARDII 250 MG PO CAPS
250.0000 mg | ORAL_CAPSULE | Freq: Two times a day (BID) | ORAL | Status: DC
  Administered 2017-06-04 – 2017-06-17 (×26): 250 mg via ORAL
  Filled 2017-06-04 (×26): qty 1

## 2017-06-04 NOTE — Progress Notes (Signed)
[facility] Progress Note  Chief Complaint:    Recent diverticulitis and ongoing abdominal pain and diarrhea  Subjective: nausea with vomiting today. She has ongoing lower abdominal pain. Had a small hard BM yesterday but today back to having watery stool with blood.   Objective:  Vital signs in last 24 hours: Temp:  [98 F (36.7 C)-98.4 F (36.9 C)] 98.2 F (36.8 C) (11/29 0430) Pulse Rate:  [60-65] 60 (11/29 0430) Resp:  [18-20] 18 (11/29 0430) BP: (109-125)/(51-71) 109/51 (11/29 0430) SpO2:  [93 %-96 %] 96 % (11/29 0430) Weight:  [225 lb 12 oz (102.4 kg)] 225 lb 12 oz (102.4 kg) (11/29 0430) Last BM Date: 06/03/17 General:   Alert, white female in NAD EENT:  Normal hearing, non icteric sclera, conjunctive pink.  Heart:  Regular rate and rhythm; no lower extremity edema Pulm: Normal respiratory effort, lungs CTA bilaterally without wheezes or crackles. Abdomen:  Soft, nondistended, nontender.  Normal bowel sounds, no masses felt. No hepatomegaly.    Neurologic:  Alert and  oriented x4;  grossly normal neurologically. Psych:  Pleasant, cooperative.  Normal mood and affect.   Intake/Output from previous day: 11/28 0701 - 11/29 0700 In: 1560 [P.O.:960; IV Piggyback:600] Out: 800 [Urine:800] Intake/Output this shift: Total I/O In: -  Out: 1000 [Urine:1000]  Lab Results: Recent Labs    06/02/17 2300 06/03/17 0550 06/04/17 0531  WBC 6.3 5.9 6.4  HGB 12.6 12.1 11.9*  HCT 38.3 37.4 37.0  PLT 450* 413* 441*   BMET Recent Labs    06/02/17 2300 06/03/17 0550 06/04/17 0531  NA 143 141 141  K 3.4* 3.6 4.2  CL 111 110 111  CO2 22 24 25   GLUCOSE 112* 98 103*  BUN 11 9 7   CREATININE 1.01* 0.89 1.00  CALCIUM 8.7* 8.4* 9.0   LFT Recent Labs    06/02/17 2300  PROT 7.5  ALBUMIN 3.5  AST 17  ALT 16  ALKPHOS 62  BILITOT 0.4    Ct Abdomen Pelvis W Contrast  Result Date: 06/03/2017 CLINICAL DATA:  Abdominal pain EXAM: CT ABDOMEN AND PELVIS  WITH CONTRAST TECHNIQUE: Multidetector CT imaging of the abdomen and pelvis was performed using the standard protocol following bolus administration of intravenous contrast. CONTRAST:  100 mL Isovue-300 intravenous COMPARISON:  05/23/2017, 03/08/2017, 12/02/2016, 12/31/2015 FINDINGS: Lower chest: No acute abnormality. Hepatobiliary: No focal liver abnormality is seen. Status post cholecystectomy. No biliary dilatation. Pancreas: Unremarkable. No pancreatic ductal dilatation or surrounding inflammatory changes. Spleen: Normal in size without focal abnormality. Adrenals/Urinary Tract: Adrenal glands are within normal limits. Stable prominent right renal pelvis. Atrophic right kidney. subcentimeter cortical hypodensity in the right kidney too small to further characterize. Bladder normal Stomach/Bowel: Stomach is nonenlarged. No dilated small bowel. Wall thickening and surrounding inflammation at the sigmoid colon. This does not appear significantly changed. Colon wall thickening now appears to involve the splenic flexure and descending colon as well. Normal appendix Vascular/Lymphatic: Aortic atherosclerosis. No enlarged abdominal or pelvic lymph nodes. Reproductive: Uterus and bilateral adnexa are unremarkable. Other: Negative for free air or free fluid. Musculoskeletal: Degenerative changes. No acute or suspicious lesion IMPRESSION: 1. Focal wall thickening with moderate surrounding inflammation at the sigmoid colon, thought previously related to diverticulitis. There now appears to be mild colon wall thickening at the splenic flexure and descending colon, therefore consider colitis, secondary to infectious or inflammatory bowel disease. There is no perforation or drainable abscess. 2. Atrophic right kidney as before. Electronically Signed  By: Donavan Foil M.D.   On: 06/03/2017 01:11    ASSESSMENT / PLAN:   90. 61 yo female with recent admission for recurrent sigmoid diverticulitis. Now readmitted with  recurrent / ongoing lower abdominal pain, diarrhea and CT scan reveling inflammation involving more diffuse area including splenic flexure and descending colon. I spoke with hospitalist this am as we had gotten some conflicting details from patient. Lengthy discussion with patient this am in attempt to further clarify symptoms.  -Patient started having postprandial loose stools after cholecystectomy in August.  She does have solid stools as well.  With episodes of diverticulitis she has severe diarrhea which usually contains blood.  She has had 4-5 episodes of this type diarrhea this year and I reviewed CT scans in February 2017, March 2017, May 2017 and November of this year suggest recurrent/ongoing diverticulitis.  She does describe constipation as well during these episodes but clarifies that she is not actually constipated but feels the need to defecate but actually does not and attributes these instances to bowel spasms.  Dicyclomine helps these spasms. -Since she has the same bowel changes with each episode of diverticulitis I doubt this is infectious but feel obliged to check for C. difficile given multiple antibiotics that she has been on.  She did have a small hard stool yesterday but she is back to having diarrhea again this morning.   -continue IV antibiotics for now.   2. Nausea and vomiting.  -prn anti-emetics   Principal Problem:   Diverticulitis of colon Active Problems:   Essential hypertension   OSA (obstructive sleep apnea)   Chronic respiratory failure with hypoxia ([facility])   Morbid (severe) obesity due to excess calories ([facility])   HLD (hyperlipidemia)   Stroke (cerebrum) ([facility])   GERD (gastroesophageal reflux disease)   Anxiety   Chronic combined systolic (congestive) and diastolic (congestive) heart failure ([facility])   Hypokalemia   Diverticulitis large intestine   Nausea vomiting and diarrhea   Lower abdominal pain    LOS: 1 day   Tye Savoy ,NP 06/04/2017, 10:19  AM  Pager number [phone]    Attending physician's note   I have taken an interval history, reviewed the chart and examined the patient. I agree with the Advanced Practitioner's note, impression and recommendations. Persistent / recurrent diverticulitis. Recommend a longer course of IV antibiotics. Awaiting stool studies to exclude C diff.   Lucio Edward, MD [facility] [phone] Mon-Fri 8a-5p [phone] after 5p, weekends, holidays

## 2017-06-04 NOTE — Progress Notes (Signed)
[facility]  PROGRESS NOTE  Ann [name] NKN:[id number] DOB: 1955/09/12 DOA: 06/02/2017 PCP: Berkley Harvey, NP   Brief HPI:    61 y.o.femalewith medical history significant ofrecurrent diverticulitis, hypertension, hyperlipidemia, stroke, TIA, GERD, anxiety, OSA on CPAP, obesity, CHF with EF of 45-50 percent, chronic respiratory failure on 4 L oxygen at home, who presents with nausea, vomiting, diarrhea and lower abdominal pain. Patient was just discharged from the hospital on 05/28/2017.  She was supposed to follow-up surgery for possible hemicolectomy as outpatient   Subjective   Patient seen and examined, is having alternate loose stools with formed stools.  Patient says that she had formed stool yesterday and started having diarrhea this morning again.  She was unable to give stool sample for C. difficile PCR.   Assessment/Plan:     1. Recurrent diverticulitis/colitis-CT scan shows sigmoid colon with colitis and possible colitis and splenic flexure and descending colon.  No perforation or abscess.  Gastroneurology was consulted.  Plan is for treating for infectious etiology.  Stool for C. difficile is currently pending.  Continue Cipro and Flagyl. 2. Hypertension- blood pressures controlled, continue amlodipine, metoprolol. 3. Chronic respiratory failure with hypoxia- Patient on 4 L of nasal cannula with oxygen at home continue as needed albuterol  4. Dysuria-patient complained of dysuria.  Urine culture showed insignificant growth. 5. Hypertension-continue Lipitor 6. History of stroke-continue aspirin, Lipitor. 7. Anxiety-continue as needed Klonopin 8. Chronic combined systolic and diastolic CHF-well compensated.  Lasix on hold.    DVT prophylaxis: Lovenox  Code Status: Full code  Family Communication: No family bedside  Disposition Plan: Likely home in 2-3 days.   Consultants:  Gastroenterology  Procedures:  None  Continuous infusions . ciprofloxacin  400 mg (06/04/17 1300)  . metronidazole 500 mg (06/04/17 0444)      Antibiotics:   Anti-infectives (From admission, onward)   Start     Dose/Rate Route Frequency Ordered Stop   06/03/17 1200  ciprofloxacin (CIPRO) IVPB 400 mg     400 mg 200 mL/hr over 60 Minutes Intravenous Every 12 hours 06/03/17 0207     06/03/17 0445  metroNIDAZOLE (FLAGYL) IVPB 500 mg     500 mg 100 mL/hr over 60 Minutes Intravenous Every 8 hours 06/03/17 0200     06/03/17 0100  ciprofloxacin (CIPRO) IVPB 400 mg     400 mg 200 mL/hr over 60 Minutes Intravenous  Once 06/03/17 0055 06/03/17 0333   06/03/17 0100  metroNIDAZOLE (FLAGYL) IVPB 500 mg  Status:  Discontinued     500 mg 100 mL/hr over 60 Minutes Intravenous  Once 06/03/17 0055 06/03/17 0200       Objective   Vitals:   06/03/17 1621 06/03/17 2119 06/04/17 0430 06/04/17 1239  BP: 125/66 118/71 (!) 109/51 116/63  Pulse: 65 61 60 62  Resp: 20 18 18    Temp: 98 F (36.7 C) 98.4 F (36.9 C) 98.2 F (36.8 C) 98.1 F (36.7 C)  TempSrc: Oral Oral Oral Oral  SpO2: 93% 95% 96% 96%  Weight:   102.4 kg (225 lb 12 oz)   Height:        Intake/Output Summary (Last 24 hours) at 06/04/2017 1428 Last data filed at 06/04/2017 0914 Gross per 24 hour  Intake 960 ml  Output 1700 ml  Net -740 ml   Filed Weights   06/02/17 2147 06/04/17 0430  Weight: 100.7 kg (222 lb) 102.4 kg (225 lb 12 oz)     Physical Examination:  Physical Exam: Eyes:  No icterus, extraocular muscles intact  Mouth: Oral mucosa is moist, no lesions on palate,  Neck: Supple, no deformities, masses, or tenderness Lungs: Normal respiratory effort, bilateral clear to auscultation, no crackles or wheezes.  Heart: Regular rate and rhythm, S1 and S2 normal, no murmurs, rubs auscultated Abdomen: BS normoactive,soft,nondistended,non-tender to palpation,no organomegaly Extremities: No pretibial edema, no erythema, no cyanosis, no clubbing Neuro : Alert and oriented to time, place and  person, No focal deficits Skin: No rashes seen on exam    Data Reviewed: I have personally reviewed following labs and imaging studies  CBG: No results for input(s): GLUCAP in the last 168 hours.  CBC: Recent Labs  Lab 06/02/17 2300 06/03/17 0550 06/04/17 0531  WBC 6.3 5.9 6.4  HGB 12.6 12.1 11.9*  HCT 38.3 37.4 37.0  MCV 100.3* 100.3* 102.2*  PLT 450* 413* 441*    Basic Metabolic Panel: Recent Labs  Lab 06/02/17 2300 06/03/17 0550 06/04/17 0531  NA 143 141 141  K 3.4* 3.6 4.2  CL 111 110 111  CO2 22 24 25   GLUCOSE 112* 98 103*  BUN 11 9 7   CREATININE 1.01* 0.89 1.00  CALCIUM 8.7* 8.4* 9.0  MG  --  1.9  --     Recent Results (from the past 240 hour(s))  Culture, blood (Routine X 2) w Reflex to ID Panel     Status: None (Preliminary result)   Collection Time: 06/03/17  2:03 AM  Result Value Ref Range Status   Specimen Description BLOOD RIGHT ANTECUBITAL  Final   Special Requests   Final    BOTTLES DRAWN AEROBIC AND ANAEROBIC Blood Culture adequate volume   Culture   Final    NO GROWTH 1 DAY Performed at [facility], [place] [address], [place], [place] [address]    Report Status PENDING  Incomplete  Culture, blood (Routine X 2) w Reflex to ID Panel     Status: None (Preliminary result)   Collection Time: 06/03/17  2:08 AM  Result Value Ref Range Status   Specimen Description BLOOD LEFT WRIST  Final   Special Requests IN PEDIATRIC BOTTLE Blood Culture adequate volume  Final   Culture   Final    NO GROWTH 1 DAY Performed at [facility], [place] [address], [place], [place] [address]    Report Status PENDING  Incomplete  Urine Culture     Status: Abnormal   Collection Time: 06/03/17  6:50 AM  Result Value Ref Range Status   Specimen Description URINE, CLEAN CATCH  Final   Special Requests NONE  Final   Culture (A)  Final    <10,000 COLONIES/mL INSIGNIFICANT GROWTH Performed at [facility], [place] [address], [place], [place] [address]     Report Status 06/04/2017 FINAL  Final     Liver Function Tests: Recent Labs  Lab 06/02/17 2300  AST 17  ALT 16  ALKPHOS 62  BILITOT 0.4  PROT 7.5  ALBUMIN 3.5   Recent Labs  Lab 06/02/17 2300  LIPASE 23   No results for input(s): AMMONIA in the last 168 hours.  Cardiac Enzymes: No results for input(s): CKTOTAL, CKMB, CKMBINDEX, TROPONINI in the last 168 hours. BNP (last 3 results) Recent Labs    01/11/17 1106 06/03/17 0550  BNP 10.4 17.5    ProBNP (last 3 results) No results for input(s): PROBNP in the last 8760 hours.    Studies: Ct Abdomen Pelvis W Contrast  Result Date: 06/03/2017 CLINICAL  DATA:  Abdominal pain EXAM: CT ABDOMEN AND PELVIS WITH CONTRAST TECHNIQUE: Multidetector CT imaging of the abdomen and pelvis was performed using the standard protocol following bolus administration of intravenous contrast. CONTRAST:  100 mL Isovue-300 intravenous COMPARISON:  05/23/2017, 03/08/2017, 12/02/2016, 12/31/2015 FINDINGS: Lower chest: No acute abnormality. Hepatobiliary: No focal liver abnormality is seen. Status post cholecystectomy. No biliary dilatation. Pancreas: Unremarkable. No pancreatic ductal dilatation or surrounding inflammatory changes. Spleen: Normal in size without focal abnormality. Adrenals/Urinary Tract: Adrenal glands are within normal limits. Stable prominent right renal pelvis. Atrophic right kidney. subcentimeter cortical hypodensity in the right kidney too small to further characterize. Bladder normal Stomach/Bowel: Stomach is nonenlarged. No dilated small bowel. Wall thickening and surrounding inflammation at the sigmoid colon. This does not appear significantly changed. Colon wall thickening now appears to involve the splenic flexure and descending colon as well. Normal appendix Vascular/Lymphatic: Aortic atherosclerosis. No enlarged abdominal or pelvic lymph nodes. Reproductive: Uterus and bilateral adnexa are unremarkable. Other: Negative for free air  or free fluid. Musculoskeletal: Degenerative changes. No acute or suspicious lesion IMPRESSION: 1. Focal wall thickening with moderate surrounding inflammation at the sigmoid colon, thought previously related to diverticulitis. There now appears to be mild colon wall thickening at the splenic flexure and descending colon, therefore consider colitis, secondary to infectious or inflammatory bowel disease. There is no perforation or drainable abscess. 2. Atrophic right kidney as before. Electronically Signed   By: Donavan Foil M.D.   On: 06/03/2017 01:11    Scheduled Meds: . amLODipine  10 mg Oral Daily  . aspirin EC  81 mg Oral Daily  . atorvastatin  10 mg Oral Daily  . chlorhexidine  15 mL Mouth Rinse BID  . dicyclomine  20 mg Oral QID  . enoxaparin (LOVENOX) injection  40 mg Subcutaneous Q24H  . feeding supplement (ENSURE ENLIVE)  237 mL Oral BID BM  . fluticasone  2 spray Each Nare Daily  . mouth rinse  15 mL Mouth Rinse q12n4p  . metoprolol succinate  50 mg Oral QHS  . mometasone-formoterol  2 puff Inhalation BID  . pantoprazole  40 mg Oral Daily  . saccharomyces boulardii  250 mg Oral BID  . topiramate  50 mg Oral BID      Time spent: 25 min  [facility] Pager [phone]. If 7PM-7AM, please contact night-coverage at [email or web address], Office  [phone]  password [email or web address]  06/04/2017, 2:28 PM  LOS: 1 day

## 2017-06-04 NOTE — Progress Notes (Signed)
Nutrition Brief Note  Patient identified on the Malnutrition Screening Tool (MST) Report  Pt's weighed 224-229 lb in 2017. Pt presented with weight gain in 2018. Pt back within 220 lb range. Pt has been ordered Ensure supplements.  PO intakes: 100%   Wt Readings from Last 15 Encounters:  06/04/17 225 lb 12 oz (102.4 kg)  05/18/17 226 lb (102.5 kg)  03/08/17 232 lb (105.2 kg)  02/10/17 250 lb (113.4 kg)  02/04/17 242 lb (109.8 kg)  01/26/17 240 lb (108.9 kg)  01/22/17 236 lb (107 kg)  01/16/17 253 lb 15.5 oz (115.2 kg)  11/17/16 239 lb 2 oz (108.5 kg)  10/27/16 240 lb (108.9 kg)  08/26/16 236 lb (107 kg)  08/13/16 236 lb (107 kg)  02/01/16 234 lb (106.1 kg)  01/28/16 234 lb (106.1 kg)  08/22/15 224 lb (101.6 kg)    Body mass index is 41.29 kg/m. Patient meets criteria for morbid obesity based on current BMI.   Current diet order is full liquid, patient is consuming approximately 100% of meals at this time. Labs and medications reviewed.   No nutrition interventions warranted at this time. If nutrition issues arise, please consult RD.   Clayton Bibles, MS, RD, [facility] Pager: [phone] After Hours Pager: [phone]

## 2017-06-05 DIAGNOSIS — K5792 Diverticulitis of intestine, part unspecified, without perforation or abscess without bleeding: Secondary | ICD-10-CM

## 2017-06-05 LAB — BASIC METABOLIC PANEL
Anion gap: 4 — ABNORMAL LOW (ref 5–15)
BUN: 5 mg/dL — AB (ref 6–20)
CO2: 29 mmol/L (ref 22–32)
Calcium: 8.7 mg/dL — ABNORMAL LOW (ref 8.9–10.3)
Chloride: 109 mmol/L (ref 101–111)
Creatinine, Ser: 1.03 mg/dL — ABNORMAL HIGH (ref 0.44–1.00)
GFR calc Af Amer: >60 mL/min (ref >60)
GFR, EST NON AFRICAN AMERICAN: 58 mL/min — AB (ref >60)
GLUCOSE: 99 mg/dL (ref 65–99)
POTASSIUM: 4 mmol/L (ref 3.5–5.1)
Sodium: 142 mmol/L (ref 135–145)

## 2017-06-05 MED ORDER — PROMETHAZINE HCL 25 MG/ML IJ SOLN
12.5000 mg | Freq: Three times a day (TID) | INTRAMUSCULAR | Status: DC | PRN
  Administered 2017-06-05 – 2017-06-16 (×18): 12.5 mg via INTRAVENOUS
  Filled 2017-06-05 (×19): qty 1

## 2017-06-05 MED ORDER — SIMETHICONE 80 MG PO CHEW
80.0000 mg | CHEWABLE_TABLET | Freq: Four times a day (QID) | ORAL | Status: DC
  Administered 2017-06-05 – 2017-06-17 (×44): 80 mg via ORAL
  Filled 2017-06-05 (×44): qty 1

## 2017-06-05 MED ORDER — DIPHENHYDRAMINE HCL 25 MG PO CAPS
25.0000 mg | ORAL_CAPSULE | Freq: Three times a day (TID) | ORAL | Status: DC | PRN
  Administered 2017-06-05 – 2017-06-09 (×4): 25 mg via ORAL
  Filled 2017-06-05 (×4): qty 1

## 2017-06-05 MED ORDER — DICYCLOMINE HCL 20 MG PO TABS
20.0000 mg | ORAL_TABLET | Freq: Three times a day (TID) | ORAL | Status: DC
  Administered 2017-06-05 – 2017-06-17 (×39): 20 mg via ORAL
  Filled 2017-06-05 (×51): qty 1

## 2017-06-05 NOTE — Progress Notes (Signed)
[facility]  PROGRESS NOTE  Ann [name] VCB:[id number] DOB: 29-Jan-1956 DOA: 06/02/2017 PCP: Berkley Harvey, NP   Brief HPI:    61 y.o.femalewith medical history significant ofrecurrent diverticulitis, hypertension, hyperlipidemia, stroke, TIA, GERD, anxiety, OSA on CPAP, obesity, CHF with EF of 45-50 percent, chronic respiratory failure on 4 L oxygen at home, who presents with nausea, vomiting, diarrhea and lower abdominal pain. Patient was just discharged from the hospital on 05/28/2017.  She was supposed to follow-up surgery for possible hemicolectomy as outpatient   Subjective   Patient seen and examined, diarrhea has resolved.  Has not had bowel movement since yesterday.  Complains of gas bloating.   Assessment/Plan:     1. Recurrent diverticulitis/colitis-CT scan shows sigmoid colon with colitis and possible colitis and splenic flexure and descending colon.  No perforation or abscess.  GI was consulted.  Plan is for treating for infectious etiology.  Stool for C. difficile is currently pending.  GI continue Cipro and Flagyl.  GI recommends getting surgical consultation.  Will obtain surgical consultation.   2. Hypertension- blood pressure controlled, continue amlodipine, metoprolol. 3. Chronic respiratory failure with hypoxia- Patient on 4 L of nasal cannula with oxygen at home continue as needed albuterol. 4. Dysuria-patient complained of dysuria.  Urine culture showed insignificant growth. 5. Hypertension-continue Lipitor 6. History of stroke-continue aspirin, Lipitor. 7. Anxiety-continue as needed Klonopin 8. Chronic combined systolic and diastolic CHF-well compe low nsated.  Lasix on hold.    DVT prophylaxis: Lovenox  Code Status: Full code  Family Communication: No family bedside  Disposition Plan: Likely home in 2-3 days.   Consultants:  Gastroenterology  Procedures:  None  Continuous infusions . ciprofloxacin Stopped (06/05/17 1509)  .  metronidazole Stopped (06/05/17 1509)      Antibiotics:   Anti-infectives (From admission, onward)   Start     Dose/Rate Route Frequency Ordered Stop   06/03/17 1200  ciprofloxacin (CIPRO) IVPB 400 mg     400 mg 200 mL/hr over 60 Minutes Intravenous Every 12 hours 06/03/17 0207     06/03/17 0445  metroNIDAZOLE (FLAGYL) IVPB 500 mg     500 mg 100 mL/hr over 60 Minutes Intravenous Every 8 hours 06/03/17 0200     06/03/17 0100  ciprofloxacin (CIPRO) IVPB 400 mg     400 mg 200 mL/hr over 60 Minutes Intravenous  Once 06/03/17 0055 06/03/17 0333   06/03/17 0100  metroNIDAZOLE (FLAGYL) IVPB 500 mg  Status:  Discontinued     500 mg 100 mL/hr over 60 Minutes Intravenous  Once 06/03/17 0055 06/03/17 0200       Objective   Vitals:   06/04/17 1239 06/04/17 2122 06/05/17 0530 06/05/17 1309  BP: 116/63 124/78 (!) 105/53 123/64  Pulse: 62 (!) 54 (!) 54 64  Resp:  18 16 18   Temp: 98.1 F (36.7 C) 98 F (36.7 C) 98.2 F (36.8 C) 98.2 F (36.8 C)  TempSrc: Oral Oral Oral Oral  SpO2: 96% 97% 93% 96%  Weight:   102.3 kg (225 lb 8.5 oz)   Height:        Intake/Output Summary (Last 24 hours) at 06/05/2017 1707 Last data filed at 06/05/2017 0850 Gross per 24 hour  Intake 920 ml  Output 2200 ml  Net -1280 ml   Filed Weights   06/02/17 2147 06/04/17 0430 06/05/17 0530  Weight: 100.7 kg (222 lb) 102.4 kg (225 lb 12 oz) 102.3 kg (225 lb 8.5 oz)     Physical Examination:  Physical Exam: Eyes: No icterus, extraocular muscles intact  Mouth: Oral mucosa is moist, no lesions on palate,  Neck: Supple, no deformities, masses, or tenderness Lungs: Normal respiratory effort, bilateral clear to auscultation, no crackles or wheezes.  Heart: Regular rate and rhythm, S1 and S2 normal, no murmurs, rubs auscultated Abdomen: BS normoactive,soft, tenderness to palpation in both right and left lower quadrants Extremities: No pretibial edema, no erythema, no cyanosis, no clubbing Neuro : Alert  and oriented to time, place and person, No focal deficits Skin: No rashes seen on exam    Data Reviewed: I have personally reviewed following labs and imaging studies  CBG: No results for input(s): GLUCAP in the last 168 hours.  CBC: Recent Labs  Lab 06/02/17 2300 06/03/17 0550 06/04/17 0531  WBC 6.3 5.9 6.4  HGB 12.6 12.1 11.9*  HCT 38.3 37.4 37.0  MCV 100.3* 100.3* 102.2*  PLT 450* 413* 441*    Basic Metabolic Panel: Recent Labs  Lab 06/02/17 2300 06/03/17 0550 06/04/17 0531 06/05/17 0450  NA 143 141 141 142  K 3.4* 3.6 4.2 4.0  CL 111 110 111 109  CO2 22 24 25 29   GLUCOSE 112* 98 103* 99  BUN 11 9 7  5*  CREATININE 1.01* 0.89 1.00 1.03*  CALCIUM 8.7* 8.4* 9.0 8.7*  MG  --  1.9  --   --     Recent Results (from the past 240 hour(s))  Culture, blood (Routine X 2) w Reflex to ID Panel     Status: None (Preliminary result)   Collection Time: 06/03/17  2:03 AM  Result Value Ref Range Status   Specimen Description BLOOD RIGHT ANTECUBITAL  Final   Special Requests   Final    BOTTLES DRAWN AEROBIC AND ANAEROBIC Blood Culture adequate volume   Culture   Final    NO GROWTH 2 DAYS Performed at [facility], [address], [place], [place] [address]    Report Status PENDING  Incomplete  Culture, blood (Routine X 2) w Reflex to ID Panel     Status: None (Preliminary result)   Collection Time: 06/03/17  2:08 AM  Result Value Ref Range Status   Specimen Description BLOOD LEFT WRIST  Final   Special Requests IN PEDIATRIC BOTTLE Blood Culture adequate volume  Final   Culture   Final    NO GROWTH 2 DAYS Performed at [facility], [place] [address], [place], [place] [address]    Report Status PENDING  Incomplete  Urine Culture     Status: Abnormal   Collection Time: 06/03/17  6:50 AM  Result Value Ref Range Status   Specimen Description URINE, CLEAN CATCH  Final   Special Requests NONE  Final   Culture (A)  Final    <10,000 COLONIES/mL INSIGNIFICANT  GROWTH Performed at [facility], [place] [address], [place], [place] [address]    Report Status 06/04/2017 FINAL  Final     Liver Function Tests: Recent Labs  Lab 06/02/17 2300  AST 17  ALT 16  ALKPHOS 62  BILITOT 0.4  PROT 7.5  ALBUMIN 3.5   Recent Labs  Lab 06/02/17 2300  LIPASE 23   No results for input(s): AMMONIA in the last 168 hours.  Cardiac Enzymes: No results for input(s): CKTOTAL, CKMB, CKMBINDEX, TROPONINI in the last 168 hours. BNP (last 3 results) Recent Labs    01/11/17 1106 06/03/17 0550  BNP 10.4 17.5    ProBNP (last 3 results) No  results for input(s): PROBNP in the last 8760 hours.    Studies: No results found.  Scheduled Meds: . amLODipine  10 mg Oral Daily  . aspirin EC  81 mg Oral Daily  . atorvastatin  10 mg Oral Daily  . chlorhexidine  15 mL Mouth Rinse BID  . dicyclomine  20 mg Oral TID AC & HS  . enoxaparin (LOVENOX) injection  40 mg Subcutaneous Q24H  . feeding supplement (ENSURE ENLIVE)  237 mL Oral BID BM  . fluticasone  2 spray Each Nare Daily  . mouth rinse  15 mL Mouth Rinse q12n4p  . metoprolol succinate  50 mg Oral QHS  . mometasone-formoterol  2 puff Inhalation BID  . pantoprazole  40 mg Oral Daily  . saccharomyces boulardii  250 mg Oral BID  . simethicone  80 mg Oral QID  . topiramate  50 mg Oral BID      Time spent: 25 min  [facility] Pager [phone]. If 7PM-7AM, please contact night-coverage at [email or web address], Office  [phone]  password [place]  06/05/2017, 5:07 PM  LOS: 2 days

## 2017-06-05 NOTE — Consult Note (Signed)
Re:   Ann [name] DOB:   October 11, 1955 MRN:   [id number]  Chief Complaint Abdominal pain  ASSESEMENT AND PLAN: 1.  Diverticulitis vs colitis  On Cipro and Flagyl  It is unclear whether she is having diverticular disease or colitis at this time.  Surgery in her would be high risk.  Agree with current medical plan, though it has been mentioned about changing antibiotics.  But her WBC is normal and her exam to me, at this time, is unremarkable.  Will follow  2.  HTN 3.  Stroke - October 2018  Shed had TIA's before  Sees Dr. Ella Bodo 4.  GERD 5.  OSA on CPAP         Chronic respiratory failure on O2 at home  For 2 years - sees Dr. Halford Chessman 6.  Morbid obesity  BMI - 42 7.  Anxiety 8.  Combined systolic and diastolic CHF.  Sees Dr. Marlou Porch 9.  Atrophic right kidney on CT scan  History of kidney stones  She had been followed by Dr. Gaynelle Arabian, until his retirement.  Chief Complaint  Patient presents with  . Abdominal Pain   PHYSICIAN REQUESTING CONSULTATION:  Dr. Eleonore Chiquito, Hospitalist  HISTORY OF PRESENT ILLNESS: Ann [name] is a 61 y.o. (DOB: February 05, 1956)  white female whose primary care physician is Berkley Harvey, NP (with Dr. Mariann Laster)   She was recently hospitalized from 05/18/2017 - 05/28/2017 for recurrent diverticulitis. She was supposed to have follow up with Drs. Armbruster and Ninfa [name].  She was readmitted on 03 June 2018 for what was thought to be recurrent diverticulitis.  It looks like on admission she was placed on Cipro and Flagyl.  Her abdominal pain is in the left lower quadrant, but better.  Her white blood count has remained normal during this admission.  And going back in her history, she complains of having attacks of abdominal pain about 6 times a year.  She is attributed some of these attacks to diverticulitis.  But her history is not consistent.  Though, when she had a cholecystectomy earlier this year, she did not mention this abdominal pain to Dr. Ninfa [name].   She had a lap chole by Dr. Ninfa [name] on 02/11/2017.  She says that she has lost 22 pounds over the last 6 months.  CT scan 06/03/2017 - 1. Focal wall thickening with moderate surrounding inflammation at the sigmoid colon, thought previously related to diverticulitis. There now appears to be mild colon wall thickening at the splenic flexure and descending colon, therefore consider colitis, secondary to infectious or inflammatory bowel disease. There is no perforation or drainable abscess. 2. Atrophic right kidney as before.    Past Medical History:  Diagnosis Date  . Adenoma of colon   . Allergy    seasonal  . Anemia    with past pregnancy -not recent  . Anxiety   . Arthritis   . Borderline diabetes   . Chronic cystitis   . Complication of anesthesia    02-20-2014 ([facility]) INTRA-OP RESPIRATORY FAILURE SECONDARY TO POSSIBLE MUCOUS ASPIRATION/  ALSO 06-06-2013 [facility]) POST-OP DESATURATION  EVEN USING CPAP, States some issues if Propofol given to rapidly.  . Diverticulitis   . Diverticulosis of colon   . Dyspnea   . Family history of adverse reaction to anesthesia    PER PT SISTER DIED AFTER GENERAL ANESTHESIA DUE TO UNDIAGNOSED OSA  . GERD (gastroesophageal reflux disease)   . H/O hiatal hernia   . Headache(784.0)  migraines, decreased since turning 50's  . History of chronic cough    "tight sounding cough"  . History of esophageal dilatation   . History of kidney stones   . History of MRSA infection    3/ 2015  AXILLARY ABSCESS  . History of recurrent UTIs   . History of TIAs NO RESIDUAL   1980;  2005;   2008 PT STATES PER CT  SCARRING RIGHT SIDE OF BRAIN  . Hyperlipidemia   . Hypertension   . Kidney disease    III  . Neuromuscular disorder ([place])    HH  . Neuropathy    pt denies this 07-05-2015  . Nocturia   . OSA on CPAP    PER SLEEP STUDY 09-08-2012  MODERATE OSA. not used in awhile, but thinks needs to start back using due to some weight gain.  . Sleep apnea   . Stroke  [facility]) [phone]   TIA'S  . SUI (stress urinary incontinence, female)       Past Surgical History:  Procedure Laterality Date  . ANTERIOR AND POSTERIOR REPAIR N/A 06/06/2013   Procedure: Anterior vaginal vault repair, Sacrospinous ligament fixation with UPHOLD lite, Kelly plication, Sacrospinous mesh fixation, Solyx transurethral sling;  Surgeon: Ailene Rud, MD;  Location: [facility];  Service: Urology;  Laterality: N/A;  . CHOLECYSTECTOMY N/A 02/10/2017   Procedure: LAPAROSCOPIC CHOLECYSTECTOMY;  Surgeon: Coralie Keens, MD;  Location: [place];  Service: General;  Laterality: N/A;  . CYSTOSCOPY W/ URETERAL STENT PLACEMENT Right 04/21/2014   Procedure: CYSTOSCOPY WITH RETROGRADE PYELOGRAM/URETERAL STENT PLACEMENT;  Surgeon: Ailene Rud, MD;  Location: [facility];  Service: Urology;  Laterality: Right;  . CYSTOSCOPY W/ URETERAL STENT PLACEMENT Right 11/21/2014   Procedure: CYSTOSCOPY WITH RETROGRADE PYELOGRAM, URETEROSCOPY WITH STONE REMOVAL, URETERAL STENT PLACEMENT;  Surgeon: Carolan Clines, MD;  Location: [facility];  Service: Urology;  Laterality: Right;  . CYSTOSCOPY W/ URETERAL STENT PLACEMENT Left 02/01/2016   Procedure: CYSTO, LEFT URETEROSCOPY, LEFT RETROGRADE AND LEFT URETERAL STENT PLACEMENT;  Surgeon: Carolan Clines, MD;  Location: [facility];  Service: Urology;  Laterality: Left;  . CYSTOSCOPY WITH RETROGRADE PYELOGRAM, URETEROSCOPY AND STENT PLACEMENT Right 06/05/2014   Procedure: CYSTOSCOPY WITH RIGHT RETROGRADE PYELOGRAM, RIGHT URETEROSCOPY, STONE EXTRACTION AND RIGHT DOUBLE J STENT PLACEMENT;  Surgeon: Ailene Rud, MD;  Location: [facility];  Service: Urology;  Laterality: Right;  . ESOPHAGEAL DILATION  X2  LAST ONE  JUNE 2014   has had 9 of these  . ESOPHAGEAL MANOMETRY N/A 09/10/2015   Procedure: ESOPHAGEAL MANOMETRY (EM);  Surgeon: Manus Gunning, MD;  [facility];  Service: Gastroenterology;   Laterality: N/A;  . HOLMIUM LASER APPLICATION Right [id number]/42/5956   Procedure: HOLMIUM LASER OF STONE ;  Surgeon: Ailene Rud, MD;  Location: [facility];  Service: Urology;  Laterality: Right;  . KNEE ARTHROSCOPY Bilateral 2008  . LAPAROSCOPIC CHOLECYSTECTOMY  02/10/2017  . MULTIPLE TOOTH EXTRACTIONS     past hx  . RECTOCELE REPAIR N/A 02/20/2014   Procedure: POSTERIOR REPAIR (RECTOCELE) WITH VAGINAL VAULT REPAIR WITH ACELL GRAFT PLACEMENT, PERINEOPLASTY, ENTEROCELE REPAIR;  Surgeon: Ailene Rud, MD;  Location[facility];  Service: Urology;  Laterality: N/A;  . TUBAL LIGATION  1987      Current Facility-Administered Medications  Medication Dose Route Frequency Provider Last Rate Last Dose  . acetaminophen (TYLENOL) tablet 650 mg  650 mg Oral Q6H PRN Ivor Costa, MD   650 mg at 06/04/17 3875  .  albuterol (PROVENTIL) (2.5 MG/3ML) 0.083% nebulizer solution 3 mL  3 mL Inhalation Q4H PRN Ivor Costa, MD      . amLODipine (NORVASC) tablet 10 mg  10 mg Oral Daily Ivor Costa, MD   10 mg at 06/05/17 5009  . aspirin EC tablet 81 mg  81 mg Oral Daily Ivor Costa, MD   81 mg at 06/05/17 0804  . atorvastatin (LIPITOR) tablet 10 mg  10 mg Oral Daily Ivor Costa, MD   10 mg at 06/05/17 0804  . chlorhexidine (PERIDEX) 0.12 % solution 15 mL  15 mL Mouth Rinse BID Oswald Hillock, MD   15 mL at 06/05/17 0803  . ciprofloxacin (CIPRO) IVPB 400 mg  400 mg Intravenous Q12H Dorrene German, [facility]   Stopped at 06/05/17 1509  . clonazePAM (KLONOPIN) tablet 0.5 mg  0.5 mg Oral BID PRN Ivor Costa, MD      . dicyclomine (BENTYL) tablet 20 mg  20 mg Oral TID AC & HS Ladene Artist, MD   20 mg at 06/05/17 1608  . diphenhydrAMINE (BENADRYL) capsule 25 mg  25 mg Oral Q8H PRN Oswald Hillock, MD      . enoxaparin (LOVENOX) injection 40 mg  40 mg Subcutaneous Q24H Ivor Costa, MD   40 mg at 06/05/17 1154  . feeding supplement (ENSURE ENLIVE) (ENSURE ENLIVE) liquid 237 mL  237 mL Oral BID BM Oswald Hillock, MD   237 mL at 06/05/17 1612  . fluticasone (FLONASE) 50 MCG/ACT nasal spray 2 spray  2 spray Each Nare Daily Ivor Costa, MD   2 spray at 06/05/17 3818  . hydrALAZINE (APRESOLINE) injection 5 mg  5 mg Intravenous Q2H PRN Ivor Costa, MD      . HYDROmorphone (DILAUDID) injection 1 mg  1 mg Intravenous Q4H PRN Ivor Costa, MD   1 mg at 06/05/17 1608  . hyoscyamine (LEVBID) 0.375 MG 12 hr tablet 0.375 mg  0.375 mg Oral Q12H PRN Ivor Costa, MD   0.375 mg at 06/03/17 0612  . MEDLINE mouth rinse  15 mL Mouth Rinse q12n4p Oswald Hillock, MD   15 mL at 06/04/17 1700  . metoprolol succinate (TOPROL-XL) 24 hr tablet 50 mg  50 mg Oral QHS Ivor Costa, MD   50 mg at 06/04/17 2203  . metroNIDAZOLE (FLAGYL) IVPB 500 mg  500 mg Intravenous Cleophas Dunker, MD   Stopped at 06/05/17 1509  . mometasone-formoterol (DULERA) 200-5 MCG/ACT inhaler 2 puff  2 puff Inhalation BID Ivor Costa, MD      . ondansetron [facility]) injection 4 mg  4 mg Intravenous Q8H PRN Ivor Costa, MD   4 mg at 06/04/17 2993  . oxyCODONE (Oxy IR/ROXICODONE) immediate release tablet 5 mg  5 mg Oral Q4H PRN Ivor Costa, MD   5 mg at 06/05/17 1608  . pantoprazole (PROTONIX) EC tablet 40 mg  40 mg Oral Daily Ivor Costa, MD   40 mg at 06/05/17 7169  . promethazine (PHENERGAN) injection 12.5 mg  12.5 mg Intravenous Q8H PRN Oswald Hillock, MD   12.5 mg at 06/05/17 6789  . saccharomyces boulardii (FLORASTOR) capsule 250 mg  250 mg Oral BID Oswald Hillock, MD   250 mg at 06/05/17 3810  . simethicone (MYLICON) chewable tablet 80 mg  80 mg Oral QID Oswald Hillock, MD   80 mg at 06/05/17 1608  . sodium chloride flush (NS) 0.9 % injection 10-40 mL  10-40 mL Intracatheter PRN [place],  Marge Duncans, MD      . topiramate (TOPAMAX) tablet 50 mg  50 mg Oral BID Ivor Costa, MD   50 mg at 06/05/17 9678  . zolpidem (AMBIEN) tablet 5 mg  5 mg Oral QHS PRN Ivor Costa, MD          Allergies  Allergen Reactions  . Lisinopril Cough  . Doxycycline Diarrhea    Severe headaches  .  Metronidazole Other (See Comments)    Severe headaches    REVIEW OF SYSTEMS: Skin:  No history of rash.  No history of abnormal moles. Infection:  No history of hepatitis or HIV.  No history of MRSA. Neurologic:  History of stroke.   Cardiac:  History of hypertension. History of CHF.  Sees Dr. Marlou Porch. Pulmonary:  OSA/CPAP.  On home O2.  Sees Dr. Halford Chessman.  Endocrine:  No diabetes. No thyroid disease. Gastrointestinal:  See HPI Urologic:  History of kidney stones.  Pelvic floor surgery - 2015 - Tannenbaum.  Rectocele - 2016 - Tannenbaum Musculoskeletal:  No history of joint or back disease. Hematologic:  No bleeding disorder.  No history of anemia.  Not anticoagulated. Psycho-social:  The patient is oriented.   The patient has no obvious psychologic or social impairment to understanding our conversation and plan.  SOCIAL and FAMILY HISTORY: Married.  Husband, Mikki Santee.  Her husband, who is 77 years older than her, had recent colon surgery by my partner Dr. Dema Severin.  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 She has 3 children:  16 yo [place], 66 yo daughter, 95 yo son.  PHYSICAL EXAM: BP 123/64 (BP Location: Right Arm)   Pulse 64   Temp 98.2 F (36.8 C) (Oral)   Resp 18   Ht 5\' 2"  (1.575 m)   Wt 102.3 kg (225 lb 8.5 oz)   SpO2 96%   BMI 41.25 kg/m   General: Obese WF who is alert.  She is somewhat scattered in her history. Skin:  Inspection and palpation - no mass or rash. Eyes:  Conjunctiva and lids unremarkable.            Pupils are equal Ears, Nose, Mouth, and Throat:  Ears and nose unremarkable            Lips and teeth are unremarable. Neck: Supple. No mass, trachea midline.  No thyroid mass. Lymph Nodes:  No supraclavicular, cervical, or inguinal nodes. Lungs: Normal respiratory effort.  Clear to auscultation and symmetric breath sounds. Heart:  Palpation of the heart is normal.            Auscultation: RRR. No murmur or rub.  Abdomen: Soft. No mass.  No hernia.              Liver:  Edge at right costal margin.  She has no abdominal pain on my exam.  Rectal: Not done. Musculoskeletal:  Normal gait.            Digits and nails are unremarkable.            Good muscle strength and ROM  in upper and lower extremities. Neurologic:  Grossly intact to motor and sensory function. Psychiatric: Normal judgement and insight. Behavior is normal.            Oriented to time, person, place.   DATA REVIEWED, COUNSELING AND COORDINATION OF CARE: Epic notes reviewed. Counseling and coordination of care exceeded more than 50% of the time spent with patient. Total time spent with patient and  charting: 60 minutes  Alphonsa Overall, MD,  [facility], [address]  [place], [place]    [place] Phone:  [phone] FAX:  [phone]

## 2017-06-05 NOTE — Progress Notes (Signed)
[facility] Progress Note  Chief Complaint:    Recent diverticulitis and ongoing abdominal pain and diarrhea  Subjective: Still has mid lower / LLQ pain, worse since trying to eat breakfast today. Cdiff specimen not obtained, stools became formed again.   Objective:  Vital signs in last 24 hours: Temp:  [98 F (36.7 C)-98.2 F (36.8 C)] 98.2 F (36.8 C) (11/30 0530) Pulse Rate:  [54-62] 54 (11/30 0530) Resp:  [16-18] 16 (11/30 0530) BP: (105-124)/(53-78) 105/53 (11/30 0530) SpO2:  [93 %-97 %] 93 % (11/30 0530) Weight:  [225 lb 8.5 oz (102.3 kg)] 225 lb 8.5 oz (102.3 kg) (11/30 0530) Last BM Date: 06/04/17 General:   Alert, well-developed, white female in NAD EENT:  Normal hearing, non icteric sclera, conjunctive pink.  Heart:  Regular rate and rhythm Pulm: Normal respiratory effort Abdomen:  Soft, nondistended, mild LLQ tenderness .  Normal bowel sounds, no masses felt. No hepatomegaly.    Neurologic:  Alert and  oriented x4;  grossly normal neurologically. Psych:  Pleasant, cooperative.  Normal mood and affect.   Intake/Output from previous day: 11/29 0701 - 11/30 0700 In: 1160 [P.O.:360; IV Piggyback:800] Out: 3200 [Urine:3200] Intake/Output this shift: Total I/O In: 120 [P.O.:120] Out: -   Lab Results: Recent Labs    06/02/17 2300 06/03/17 0550 06/04/17 0531  WBC 6.3 5.9 6.4  HGB 12.6 12.1 11.9*  HCT 38.3 37.4 37.0  PLT 450* 413* 441*   BMET Recent Labs    06/03/17 0550 06/04/17 0531 06/05/17 0450  NA 141 141 142  K 3.6 4.2 4.0  CL 110 111 109  CO2 24 25 29   GLUCOSE 98 103* 99  BUN 9 7 5*  CREATININE 0.89 1.00 1.03*  CALCIUM 8.4* 9.0 8.7*   LFT Recent Labs    06/02/17 2300  PROT 7.5  ALBUMIN 3.5  AST 17  ALT 16  ALKPHOS 62  BILITOT 0.4    ASSESSMENT / PLAN:   Hx of recurrent sigmoid diverticulitis, recent admission for same. Now readmitted with recurrent / ongoing lower abdominal pain, diarrhea and CT scan revealing  more diffuse colonic inflammation involving splenic flexure and descending colon. Based on CT scan changes we felt colitis (C. difficile) needed to be excluded.  At this point her stools have alternated between loose and hard so agree C. difficile unlikely.  -She has not had any improvement in abdominal pain which is definitely worse with eating.  She has been on antibiotics, p.o. and IV for over 3 weeks now for diverticulitis.  Was supposed to have outpatient surgical evaluation.  At this point would recommend surgery see her inpatient.  Perhaps she needs a change in antibiotics? -Pain is exacerbated by eating. Would not advance diet any further right now.    Principal Problem:   Diverticulitis of colon Active Problems:   Essential hypertension   OSA (obstructive sleep apnea)   Chronic respiratory failure with hypoxia (HCC)   Morbid (severe) obesity due to excess calories (HCC)   HLD (hyperlipidemia)   Stroke (cerebrum) (HCC)   GERD (gastroesophageal reflux disease)   Anxiety   Chronic combined systolic (congestive) and diastolic (congestive) heart failure (HCC)   Hypokalemia   Diverticulitis large intestine   Nausea vomiting and diarrhea   Lower abdominal pain    LOS: 2 days   Tye Savoy ,NP 06/05/2017, 11:02 AM     Attending physician's note   I have taken an interval history, reviewed the chart and examined the  patient. I agree with the Advanced Practitioner's note, impression and recommendations. Diverticulitis, not improved on IV antibiotics. Change dicyclomine to ac and hs. Consider change in antibiotics. Maintain full liquid diet. Proceed with surgical consult.   Lucio Edward, MD [facility] [phone] Mon-Fri 8a-5p [phone] after 5p, weekends, holidays  Pager number [phone]

## 2017-06-05 NOTE — Progress Notes (Signed)
Pt. Refuses cpap at this time.  States that she is on oxygen and that is plenty for the hospital at this time.

## 2017-06-06 NOTE — Progress Notes (Signed)
Pt has started to have formed stools, sent specimen of semi loose stool, Lab rejected and need to be recollected. SRP, RN

## 2017-06-06 NOTE — Progress Notes (Signed)
[facility] Office:  [phone] General Surgery Progress Note   LOS: 3 days  POD -     Chief Complaint: Abdominal pain  Assessment and Plan: 1.  Diverticulitis vs colitis             On Cipro and Flagyl - 11/28 >>>                  WBC - 6,400 - 06/04/2017            On full liquids - she says that "food goes right through her" when she eats.  Complains of lower abdominal soreness. She is on the phone to her husband.  2.  HTN 3.  Stroke - October 2018             Shed had TIA's before             Sees Dr. Ella Bodo 4.  GERD 5.  OSA on CPAP             Chronic respiratory failure on O2 at home             For 2 years - sees Dr. Halford Chessman 6.  Morbid obesity             BMI - 42 7.  Anxiety 8.  Combined systolic and diastolic CHF.             Sees Dr. Marlou Porch 9.  Atrophic right kidney on CT scan             History of kidney stones             She had been followed by Dr. Gaynelle Arabian, until his retirement. 10.  DVT prophylaxis - Lovenox   Principal Problem:   Diverticulitis of colon Active Problems:   Essential hypertension   OSA (obstructive sleep apnea)   Chronic respiratory failure with hypoxia (HCC)   Morbid (severe) obesity due to excess calories (HCC)   HLD (hyperlipidemia)   Stroke (cerebrum) (HCC)   GERD (gastroesophageal reflux disease)   Anxiety   Chronic combined systolic (congestive) and diastolic (congestive) heart failure (HCC)   Hypokalemia   Diverticulitis large intestine   Nausea vomiting and diarrhea   Lower abdominal pain   Subjective:  Alert.  About the same as last night.  She had some more loose stool.  Objective:   Vitals:   06/05/17 2129 06/06/17 0502  BP: (!) 115/58 115/78  Pulse: (!) 57 (!) 55  Resp: 16 16  Temp: 98.8 F (37.1 C) (!) 97.4 F (36.3 C)  SpO2: 96% 96%     Intake/Output from previous day:  11/30 0701 - 12/01 0700 In: 1060 [P.O.:360; IV Piggyback:700] Out: 2600 [Urine:2600]  Intake/Output this shift:  No  intake/output data recorded.   Physical Exam:   General: Obese WF who is alert and oriented.    HEENT: Normal. Pupils equal. .   Lungs: Clear.   Abdomen: Soft, though sore in the lower mid abdomen.  No peritoneal signs.   Lab Results:    Recent Labs    06/04/17 0531  WBC 6.4  HGB 11.9*  HCT 37.0  PLT 441*    BMET   Recent Labs    06/04/17 0531 06/05/17 0450  NA 141 142  K 4.2 4.0  CL 111 109  CO2 25 29  GLUCOSE 103* 99  BUN 7 5*  CREATININE 1.00 1.03*  CALCIUM 9.0 8.7*    PT/INR  No results  for input(s): LABPROT, INR in the last 72 hours.  ABG  No results for input(s): PHART, HCO3 in the last 72 hours.  Invalid input(s): PCO2, PO2   Studies/Results:  No results found.   Anti-infectives:   Anti-infectives (From admission, onward)   Start     Dose/Rate Route Frequency Ordered Stop   06/03/17 1200  ciprofloxacin (CIPRO) IVPB 400 mg     400 mg 200 mL/hr over 60 Minutes Intravenous Every 12 hours 06/03/17 0207     06/03/17 0445  metroNIDAZOLE (FLAGYL) IVPB 500 mg     500 mg 100 mL/hr over 60 Minutes Intravenous Every 8 hours 06/03/17 0200     06/03/17 0100  ciprofloxacin (CIPRO) IVPB 400 mg     400 mg 200 mL/hr over 60 Minutes Intravenous  Once 06/03/17 0055 06/03/17 0333   06/03/17 0100  metroNIDAZOLE (FLAGYL) IVPB 500 mg  Status:  Discontinued     500 mg 100 mL/hr over 60 Minutes Intravenous  Once 06/03/17 0055 06/03/17 0200      Alphonsa Overall, MD, FACS Pager: [facility] Office: [phone] 06/06/2017

## 2017-06-06 NOTE — Progress Notes (Signed)
Pharmacy Antibiotic Note  Ann [name] is a 61 y.o. female with abdominal pain admitted on 06/02/2017 with diverticulitis.  Pharmacy has been consulted for cipro dosing.  Day #4 Cipro 400 mg IV q12h and Flagyl 500 mg IV q8h.  CrCl stable.  Plan: Continue Cipro 400 mg IV q12h. Dosage remains stable and need for further dosage adjustment appears unlikely at present.  Pharmacy will sign off at this time.  Please reconsult if a change in clinical status warrants re-evaluation of dosage.  Height: 5\' 2"  (157.5 cm) Weight: 229 lb 8 oz (104.1 kg) IBW/kg (Calculated) : 50.1  Temp (24hrs), Avg:98.1 F (36.7 C), Min:97.4 F (36.3 C), Max:98.8 F (37.1 C)  Recent Labs  Lab 06/02/17 2300 06/03/17 0040 06/03/17 0550 06/04/17 0531 06/05/17 0450  WBC 6.3  --  5.9 6.4  --   CREATININE 1.01*  --  0.89 1.00 1.03*  LATICACIDVEN  --  0.51  --   --   --     Estimated Creatinine Clearance: 66.6 mL/min (A) (by C-G formula based on SCr of 1.03 mg/dL (H)).    Allergies  Allergen Reactions  . Lisinopril Cough  . Doxycycline Diarrhea    Severe headaches  . Metronidazole Other (See Comments)    Severe headaches    Antimicrobials this admission: 11/28 cipro >>  11/28 flagyl >>   Thank you for allowing pharmacy to be a part of this patient's care.  Hershal Coria 06/06/2017 12:00 PM

## 2017-06-06 NOTE — Progress Notes (Signed)
     [facility]ology Progress Note  Chief Complaint:    Recent diverticulitis. Ongoing abdominal pain  Subjective: Still has lower abdominal pain, slightly improved. Her stools vary in consistency between hard and liquid.   Objective:  Vital signs in last 24 hours: Temp:  [97.4 F (36.3 C)-98.8 F (37.1 C)] 97.4 F (36.3 C) (12/01 0502) Pulse Rate:  [55-64] 55 (12/01 0502) Resp:  [16-18] 16 (12/01 0502) BP: (115-123)/(58-78) 115/78 (12/01 0502) SpO2:  [96 %] 96 % (12/01 0502) Weight:  [229 lb 8 oz (104.1 kg)] 229 lb 8 oz (104.1 kg) (12/01 0502) Last BM Date: 06/05/17 General:   Alert, well-developed, white female in NAD EENT:  Normal hearing, non icteric sclera, conjunctive pink.  Heart:  Regular rate and rhythm, no lower extremity edema Pulm: Normal respiratory effort Abdomen:  Soft, nondistended, mild mid lower and LLQ tenderness.  Normal bowel sounds, no masses felt. No hepatomegaly.    Neurologic:  Alert and  oriented x4;  grossly normal neurologically. Psych:  Pleasant, cooperative.  Normal mood and affect.   Intake/Output from previous day: 11/30 0701 - 12/01 0700 In: 1060 [P.O.:360; IV Piggyback:700] Out: 2600 [Urine:2600] Intake/Output this shift: No intake/output data recorded.  Lab Results: Recent Labs    06/04/17 0531  WBC 6.4  HGB 11.9*  HCT 37.0  PLT 441*   BMET Recent Labs    06/04/17 0531 06/05/17 0450  NA 141 142  K 4.2 4.0  CL 111 109  CO2 25 29  GLUCOSE 103* 99  BUN 7 5*  CREATININE 1.00 1.03*  CALCIUM 9.0 8.7*    ASSESSMENT / PLAN:   Recent admission for sigmoid diverticulitis. Readmitted with recurrent / ongoing sx. CT scan reveals more diffuse area of inflammation involving splenic flexure and descending colon when compared to previous CTscan last admission. Documented episodes of recurrent / persistent diverticulitis by imaging several times over last 1.5 years. This current episode could represent ongoing diverticulitis  but we have all been concerned about infectious colitis as well. Appreciate Surgery's help. Hospitalist nor Surgery feel any merit in changing antibiotics at this point.  -Spoke with Hospitalist who spoke with Surgery today. We are going to advance diet to soft.  -Collect stool for c-diff to rule it out.    Principal Problem:   Diverticulitis of colon Active Problems:   Essential hypertension   OSA (obstructive sleep apnea)   Chronic respiratory failure with hypoxia (HCC)   Morbid (severe) obesity due to excess calories (HCC)   HLD (hyperlipidemia)   Stroke (cerebrum) (HCC)   GERD (gastroesophageal reflux disease)   Anxiety   Chronic combined systolic (congestive) and diastolic (congestive) heart failure (HCC)   Hypokalemia   Diverticulitis large intestine   Nausea vomiting and diarrhea   Lower abdominal pain    LOS: 3 days   Ann [name] ,NP 06/06/2017, 8:50 AM  Pager number [phone]    Attending physician's note   I have taken an interval history, reviewed the chart and examined the patient. I agree with the Advanced Practitioner's note, impression and recommendations. Left colon diverticulitis. Abdominal pain and abdominal tenderness are slightly improved. Continue current mgmt. Appreciate surgical consult.   Lucio Edward, MD [facility] [phone] Mon-Fri 8a-5p [phone] after 5p, weekends, holidays

## 2017-06-06 NOTE — Progress Notes (Signed)
[facility]  PROGRESS NOTE  Ann [name] BJS:[id number] DOB: 1955/09/30 DOA: 06/02/2017 PCP: Berkley Harvey, NP   Brief HPI:    61 y.o.femalewith medical history significant ofrecurrent diverticulitis, hypertension, hyperlipidemia, stroke, TIA, GERD, anxiety, OSA on CPAP, obesity, CHF with EF of 45-50 percent, chronic respiratory failure on 4 L oxygen at home, who presents with nausea, vomiting, diarrhea and lower abdominal pain. Patient was just discharged from the hospital on 05/28/2017.  She was supposed to follow-up surgery for possible hemicolectomy as outpatient   Subjective   Seen and examined,.  Diarrhea has resolved.  Diet has now been advanced to soft diet.   Assessment/Plan:     1. Recurrent diverticulitis/colitis-CT scan shows sigmoid colon with colitis and possible colitis and splenic flexure and descending colon.  No perforation or abscess.  GI was consulted.  Plan is for treating for infectious etiology.  Stool for C. difficile is currently pending.  GI continue Cipro and Flagyl.  General surgery was consulted.  No plan for immediate surgery at this time.  Diet has been advanced to soft diet. 2. Hypertension- blood pressure controlled, continue amlodipine, metoprolol. 3. Chronic respiratory failure with hypoxia- Patient on 4 L of nasal cannula with oxygen at home continue as needed albuterol. 4. Dysuria-patient complained of dysuria.  Urine culture showed insignificant growth. 5. Hypertension-continue Lipitor 6. History of stroke-continue aspirin, Lipitor. 7. Anxiety-continue as needed Klonopin 8. Chronic combined systolic and diastolic CHF-well compensated.  Lasix on hold.    DVT prophylaxis: Lovenox  Code Status: Full code  Family Communication: No family bedside  Disposition Plan: Likely home in 2-3 days.   Consultants:  Gastroenterology  Procedures:  None  Continuous infusions . ciprofloxacin Stopped (06/06/17 1225)  . metronidazole  Stopped (06/06/17 1555)      Antibiotics:   Anti-infectives (From admission, onward)   Start     Dose/Rate Route Frequency Ordered Stop   06/03/17 1200  ciprofloxacin (CIPRO) IVPB 400 mg     400 mg 200 mL/hr over 60 Minutes Intravenous Every 12 hours 06/03/17 0207     06/03/17 0445  metroNIDAZOLE (FLAGYL) IVPB 500 mg     500 mg 100 mL/hr over 60 Minutes Intravenous Every 8 hours 06/03/17 0200     06/03/17 0100  ciprofloxacin (CIPRO) IVPB 400 mg     400 mg 200 mL/hr over 60 Minutes Intravenous  Once 06/03/17 0055 06/03/17 0333   06/03/17 0100  metroNIDAZOLE (FLAGYL) IVPB 500 mg  Status:  Discontinued     500 mg 100 mL/hr over 60 Minutes Intravenous  Once 06/03/17 0055 06/03/17 0200       Objective   Vitals:   06/05/17 1309 06/05/17 2129 06/06/17 0502 06/06/17 1347  BP: 123/64 (!) 115/58 115/78 (!) 107/44  Pulse: 64 (!) 57 (!) 55 61  Resp: 18 16 16 18   Temp: 98.2 F (36.8 C) 98.8 F (37.1 C) (!) 97.4 F (36.3 C) 98.2 F (36.8 C)  TempSrc: Oral Oral Oral Oral  SpO2: 96% 96% 96% 94%  Weight:   104.1 kg (229 lb 8 oz)   Height:        Intake/Output Summary (Last 24 hours) at 06/06/2017 1628 Last data filed at 06/06/2017 1500 Gross per 24 hour  Intake 1480 ml  Output 3500 ml  Net -2020 ml   Filed Weights   06/04/17 0430 06/05/17 0530 06/06/17 0502  Weight: 102.4 kg (225 lb 12 oz) 102.3 kg (225 lb 8.5 oz) 104.1 kg (229 lb 8 oz)  Physical Examination:  Physical Exam: Eyes: No icterus, extraocular muscles intact  Mouth: Oral mucosa is moist, no lesions on palate,  Neck: Supple, no deformities, masses, or tenderness Lungs: Normal respiratory effort, bilateral clear to auscultation, no crackles or wheezes.  Heart: Regular rate and rhythm, S1 and S2 normal, no murmurs, rubs auscultated Abdomen: BS normoactive,soft,nondistended,non-tender to palpation,no organomegaly Extremities: No pretibial edema, no erythema, no cyanosis, no clubbing Neuro : Alert and oriented  to time, place and person, No focal deficits Skin: No rashes seen on exam    Data Reviewed: I have personally reviewed following labs and imaging studies  CBG: No results for input(s): GLUCAP in the last 168 hours.  CBC: Recent Labs  Lab 06/02/17 2300 06/03/17 0550 06/04/17 0531  WBC 6.3 5.9 6.4  HGB 12.6 12.1 11.9*  HCT 38.3 37.4 37.0  MCV 100.3* 100.3* 102.2*  PLT 450* 413* 441*    Basic Metabolic Panel: Recent Labs  Lab 06/02/17 2300 06/03/17 0550 06/04/17 0531 06/05/17 0450  NA 143 141 141 142  K 3.4* 3.6 4.2 4.0  CL 111 110 111 109  CO2 22 24 25 29   GLUCOSE 112* 98 103* 99  BUN 11 9 7  5*  CREATININE 1.01* 0.89 1.00 1.03*  CALCIUM 8.7* 8.4* 9.0 8.7*  MG  --  1.9  --   --     Recent Results (from the past 240 hour(s))  Culture, blood (Routine X 2) w Reflex to ID Panel     Status: None (Preliminary result)   Collection Time: 06/03/17  2:03 AM  Result Value Ref Range Status   Specimen Description BLOOD RIGHT ANTECUBITAL  Final   Special Requests   Final    BOTTLES DRAWN AEROBIC AND ANAEROBIC Blood Culture adequate volume   Culture   Final    NO GROWTH 3 DAYS Performed at [facility], [address] [place], [place] [address]    Report Status PENDING  Incomplete  Culture, blood (Routine X 2) w Reflex to ID Panel     Status: None (Preliminary result)   Collection Time: 06/03/17  2:08 AM  Result Value Ref Range Status   Specimen Description BLOOD LEFT WRIST  Final   Special Requests IN PEDIATRIC BOTTLE Blood Culture adequate volume  Final   Culture   Final    NO GROWTH 3 DAYS Performed at [facility], [place] [address], [place], [place] [address]    Report Status PENDING  Incomplete  Urine Culture     Status: Abnormal   Collection Time: 06/03/17  6:50 AM  Result Value Ref Range Status   Specimen Description URINE, CLEAN CATCH  Final   Special Requests NONE  Final   Culture (A)  Final    <10,000 COLONIES/mL INSIGNIFICANT GROWTH Performed  at [facility], [place] [address] [place], [place] [address]    Report Status 06/04/2017 FINAL  Final     Liver Function Tests: Recent Labs  Lab 06/02/17 2300  AST 17  ALT 16  ALKPHOS 62  BILITOT 0.4  PROT 7.5  ALBUMIN 3.5   Recent Labs  Lab 06/02/17 2300  LIPASE 23   No results for input(s): AMMONIA in the last 168 hours.  Cardiac Enzymes: No results for input(s): CKTOTAL, CKMB, CKMBINDEX, TROPONINI in the last 168 hours. BNP (last 3 results) Recent Labs    01/11/17 1106 06/03/17 0550  BNP 10.4 17.5    ProBNP (last 3 results) No results for input(s): PROBNP  in the last 8760 hours.    Studies: No results found.  Scheduled Meds: . amLODipine  10 mg Oral Daily  . aspirin EC  81 mg Oral Daily  . atorvastatin  10 mg Oral Daily  . chlorhexidine  15 mL Mouth Rinse BID  . dicyclomine  20 mg Oral TID AC & HS  . enoxaparin (LOVENOX) injection  40 mg Subcutaneous Q24H  . feeding supplement (ENSURE ENLIVE)  237 mL Oral BID BM  . fluticasone  2 spray Each Nare Daily  . mouth rinse  15 mL Mouth Rinse q12n4p  . metoprolol succinate  50 mg Oral QHS  . mometasone-formoterol  2 puff Inhalation BID  . pantoprazole  40 mg Oral Daily  . saccharomyces boulardii  250 mg Oral BID  . simethicone  80 mg Oral QID  . topiramate  50 mg Oral BID      Time spent: 25 min  [facility] Pager [phone]. If 7PM-7AM, please contact night-coverage at [email or web address], Office  [phone]  password [email or web address]  06/06/2017, 4:28 PM  LOS: 3 days

## 2017-06-06 NOTE — Progress Notes (Signed)
Pt refuses cpap stating she is fine with oxygen tonight.  Pt was advised that RT is available all night and encouraged her to call should she change her mind.

## 2017-06-07 DIAGNOSIS — R1032 Left lower quadrant pain: Secondary | ICD-10-CM

## 2017-06-07 DIAGNOSIS — R933 Abnormal findings on diagnostic imaging of other parts of digestive tract: Secondary | ICD-10-CM

## 2017-06-07 NOTE — Progress Notes (Signed)
Pt. Offered CPAP machine again tonight and again refused.  Stated that MD said as long as she had on oxygen her heart would not be damaged and she would not need cpap at hospital.

## 2017-06-07 NOTE — Progress Notes (Signed)
[facility]:  [phone] General Surgery Progress Note   LOS: 4 days  POD -     Chief Complaint: Abdominal pain  Assessment and Plan: 1.  Diverticulitis vs colitis             On Cipro and Flagyl - 11/28 >>>                  WBC - 6,400 - 06/04/2017            Says that she feels like she has the "flu", still nauseated with food, but minimal abdominal tenderness  2.  HTN 3.  Stroke - October 2018             She's had TIA's before             Sees Dr. Ella Bodo (I cannot find a note in [place]) 4.  GERD 5.  OSA on CPAP             Chronic respiratory failure on O2 at home             For 2 years - sees Dr. Halford Chessman 6.  Morbid obesity             BMI - 42 7.  Anxiety 8.  Combined systolic and diastolic CHF.             Sees Dr. Marlou Porch 9.  Atrophic right kidney on CT scan             History of kidney stones             She had been followed by Dr. Gaynelle Arabian, until his retirement. 10.  DVT prophylaxis - Lovenox   Principal Problem:   Diverticulitis of colon Active Problems:   Essential hypertension   OSA (obstructive sleep apnea)   Chronic respiratory failure with hypoxia (HCC)   Morbid (severe) obesity due to excess calories (HCC)   HLD (hyperlipidemia)   Stroke (cerebrum) (HCC)   GERD (gastroesophageal reflux disease)   Anxiety   Chronic combined systolic (congestive) and diastolic (congestive) heart failure (HCC)   Hypokalemia   Diverticulitis large intestine   Nausea vomiting and diarrhea   Lower abdominal pain   Subjective:  Complains of not feeling good, but looks okay.  Objective:   Vitals:   06/06/17 2110 06/07/17 0416  BP:  (!) 110/55  Pulse:  63  Resp:  16  Temp:  98.2 F (36.8 C)  SpO2: 96% 96%     Intake/Output from previous day:  12/01 0701 - 12/02 0700 In: 1180 [P.O.:480; IV Piggyback:700] Out: 3100 [Urine:3100]  Intake/Output this shift:  No intake/output data recorded.   Physical Exam:   General: Obese WF who is alert  and oriented.    HEENT: Normal. Pupils equal. .   Lungs: Clear.   Abdomen: Soft, minimal soreness in the lower mid abdomen.  No peritoneal signs.   Lab Results:    No results for input(s): WBC, HGB, HCT, PLT in the last 72 hours.  BMET   Recent Labs    06/05/17 0450  NA 142  K 4.0  CL 109  CO2 29  GLUCOSE 99  BUN 5*  CREATININE 1.03*  CALCIUM 8.7*    PT/INR  No results for input(s): LABPROT, INR in the last 72 hours.  ABG  No results for input(s): PHART, HCO3 in the last 72 hours.  Invalid input(s): PCO2, PO2   Studies/Results:  No results found.  Anti-infectives:   Anti-infectives (From admission, onward)   Start     Dose/Rate Route Frequency Ordered Stop   06/03/17 1200  ciprofloxacin (CIPRO) IVPB 400 mg     400 mg 200 mL/hr over 60 Minutes Intravenous Every 12 hours 06/03/17 0207     06/03/17 0445  metroNIDAZOLE (FLAGYL) IVPB 500 mg     500 mg 100 mL/hr over 60 Minutes Intravenous Every 8 hours 06/03/17 0200     06/03/17 0100  ciprofloxacin (CIPRO) IVPB 400 mg     400 mg 200 mL/hr over 60 Minutes Intravenous  Once 06/03/17 0055 06/03/17 0333   06/03/17 0100  metroNIDAZOLE (FLAGYL) IVPB 500 mg  Status:  Discontinued     500 mg 100 mL/hr over 60 Minutes Intravenous  Once 06/03/17 0055 06/03/17 0200      Alphonsa Overall, MD, FACS Pager: [facility]: [phone] 06/07/2017

## 2017-06-07 NOTE — Progress Notes (Signed)
     [facility] Progress Note  Chief Complaint:    Diverticulitis  Subjective: "Sick" today. Very nauseated with vomiting and persistent lower abdominal pain  Objective:  Vital signs in last 24 hours: Temp:  [98.2 F (36.8 C)-98.4 F (36.9 C)] 98.2 F (36.8 C) (12/02 1351) Pulse Rate:  [62-67] 67 (12/02 1351) Resp:  [16-18] 18 (12/02 1351) BP: (110-121)/(55-68) 121/56 (12/02 1351) SpO2:  [94 %-96 %] 94 % (12/02 1351) Weight:  [226 lb 13.7 oz (102.9 kg)] 226 lb 13.7 oz (102.9 kg) (12/02 0416) Last BM Date: 06/06/17 General:   Alert, well-developed, white female  in NAD EENT:  Normal hearing, non icteric sclera, conjunctive pink.  Heart:  Regular rate and rhythm; no murmurs. no lower extremity edema Pulm: Normal respiratory effort, lungs CTA bilaterally without wheezes or crackles. Abdomen:  Soft, nondistended, nontender.  Normal bowel sounds, no masses felt. No hepatomegaly.    Neurologic:  Alert and  oriented x4;  grossly normal neurologically. Psych:  Pleasant, cooperative.  Normal mood and affect.   Intake/Output from previous day: 12/01 0701 - 12/02 0700 In: 1180 [P.O.:480; IV Piggyback:700] Out: 3100 [Urine:3100] Intake/Output this shift: Total I/O In: 780 [P.O.:480; IV Piggyback:300] Out: -   Lab Results: No results for input(s): WBC, HGB, HCT, PLT in the last 72 hours. BMET Recent Labs    06/05/17 0450  NA 142  K 4.0  CL 109  CO2 29  GLUCOSE 99  BUN 5*  CREATININE 1.03*  CALCIUM 8.7*    ASSESSMENT / PLAN:   Recent admission for sigmoid diverticulitis. Readmitted with recurrent / ongoing sx. CT scan reveals more diffuse area of inflammation involving splenic flexure and descending colon when compared to previous CTscan last admission. Documented episodes of recurrent / persistent diverticulitis by imaging several times over last 1.5 years. This current episode could represent ongoing diverticulitis but we have een concerned about  infectious colitis / IBD as well. Appreciate Surgery's help. Hospitalist nor Surgery feel any merit in changing antibiotics at this point. She is having nausea / vomiting today and ongoing lower abdominal pain. No further diarrhea.  -She doesn't feel any better. Pain and nausea worse with eating. No further diarrhea at least.  -She has had several CT scans but at some point if still having significant pain then should be reimaged. Continue present course for now. .  Principal Problem:   Diverticulitis of colon Active Problems:   Essential hypertension   OSA (obstructive sleep apnea)   Chronic respiratory failure with hypoxia (HCC)   Morbid (severe) obesity due to excess calories (HCC)   HLD (hyperlipidemia)   Stroke (cerebrum) (HCC)   GERD (gastroesophageal reflux disease)   Anxiety   Chronic combined systolic (congestive) and diastolic (congestive) heart failure (HCC)   Hypokalemia   Diverticulitis large intestine   Nausea vomiting and diarrhea   Lower abdominal pain    LOS: 4 days   Tye Savoy ,NP 06/07/2017, 3:21 PM Pager number [phone]    Attending physician's note   I have taken an interval history, reviewed the chart and examined the patient. I agree with the Advanced Practitioner's note, impression and recommendations. Nausea and vomiting today. Change to clear liquid diet today. Consider colonoscopy/flex sig/repeat CT scan.   Lucio Edward, MD [facility] [phone] Mon-Fri 8a-5p [phone] after 5p, weekends, holidays

## 2017-06-07 NOTE — Progress Notes (Signed)
[facility]  PROGRESS NOTE  Ann [name] ELF:[id number] DOB: 04-08-1956 DOA: 06/02/2017 PCP: Berkley Harvey, NP   Brief HPI:    61 y.o.femalewith medical history significant ofrecurrent diverticulitis, hypertension, hyperlipidemia, stroke, TIA, GERD, anxiety, OSA on CPAP, obesity, CHF with EF of 45-50 percent, chronic respiratory failure on 4 L oxygen at home, who presents with nausea, vomiting, diarrhea and lower abdominal pain. Patient was just discharged from the hospital on 05/28/2017.  She was supposed to follow-up surgery for possible hemicolectomy as outpatient   Subjective   Patient seen and examined, diarrhea has resolved.  Started on soft diet.  Complains of cramping in the lower abdomen.   Assessment/Plan:     1. Recurrent diverticulitis/colitis-significant improvement, currently on soft diet with no diarrhea .CT scan shows sigmoid colon with colitis and possible colitis and splenic flexure and descending colon.  No perforation or abscess.  GI was consulted.  Plan is for treating for infectious etiology.  Stool for C. difficile is currently pending. Continue Cipro and Flagyl.  General surgery was consulted.  No plan for immediate surgery at this time.  Diet has been advanced to soft diet. 2. Hypertension- blood pressure controlled, continue amlodipine, metoprolol. 3. Chronic respiratory failure with hypoxia- Patient on 4 L of nasal cannula with oxygen at home continue as needed albuterol. 4. Dysuria-patient complained of dysuria.  Urine culture showed insignificant growth. 5. Hypertension-continue Lipitor 6. History of stroke-continue aspirin, Lipitor. 7. Anxiety-continue as needed Klonopin 8. Chronic combined systolic and diastolic CHF-well compensated.  Lasix on hold.    DVT prophylaxis: Lovenox  Code Status: Full code  Family Communication: No family bedside  Disposition Plan: Likely home in 2-3  days.   Consultants:  Gastroenterology  Procedures:  None  Continuous infusions . ciprofloxacin Stopped (06/07/17 0058)  . metronidazole Stopped (06/07/17 0622)      Antibiotics:   Anti-infectives (From admission, onward)   Start     Dose/Rate Route Frequency Ordered Stop   06/03/17 1200  ciprofloxacin (CIPRO) IVPB 400 mg     400 mg 200 mL/hr over 60 Minutes Intravenous Every 12 hours 06/03/17 0207     06/03/17 0445  metroNIDAZOLE (FLAGYL) IVPB 500 mg     500 mg 100 mL/hr over 60 Minutes Intravenous Every 8 hours 06/03/17 0200     06/03/17 0100  ciprofloxacin (CIPRO) IVPB 400 mg     400 mg 200 mL/hr over 60 Minutes Intravenous  Once 06/03/17 0055 06/03/17 0333   06/03/17 0100  metroNIDAZOLE (FLAGYL) IVPB 500 mg  Status:  Discontinued     500 mg 100 mL/hr over 60 Minutes Intravenous  Once 06/03/17 0055 06/03/17 0200       Objective   Vitals:   06/06/17 1347 06/06/17 1948 06/06/17 2110 06/07/17 0416  BP: (!) 107/44 120/68  (!) 110/55  Pulse: 61 62  63  Resp: 18 18  16   Temp: 98.2 F (36.8 C) 98.4 F (36.9 C)  98.2 F (36.8 C)  TempSrc: Oral Oral  Oral  SpO2: 94% 95% 96% 96%  Weight:    102.9 kg (226 lb 13.7 oz)  Height:        Intake/Output Summary (Last 24 hours) at 06/07/2017 1158 Last data filed at 06/07/2017 0622 Gross per 24 hour  Intake 1180 ml  Output 3100 ml  Net -1920 ml   Filed Weights   06/05/17 0530 06/06/17 0502 06/07/17 0416  Weight: 102.3 kg (225 lb 8.5 oz) 104.1 kg (229 lb 8 oz) 102.9  kg (226 lb 13.7 oz)     Physical Examination:  Physical Exam: Eyes: No icterus, extraocular muscles intact  Mouth: Oral mucosa is moist, no lesions on palate,  Neck: Supple, no deformities, masses, or tenderness Lungs: Normal respiratory effort, bilateral clear to auscultation, no crackles or wheezes.  Heart: Regular rate and rhythm, S1 and S2 normal, no murmurs, rubs auscultated Abdomen: BS normoactive,soft,nondistended,non-tender to palpation,no  organomegaly Extremities: No pretibial edema, no erythema, no cyanosis, no clubbing Neuro : Alert and oriented to time, place and person, No focal deficits Skin: No rashes seen on exam    Data Reviewed: I have personally reviewed following labs and imaging studies  CBG: No results for input(s): GLUCAP in the last 168 hours.  CBC: Recent Labs  Lab 06/02/17 2300 06/03/17 0550 06/04/17 0531  WBC 6.3 5.9 6.4  HGB 12.6 12.1 11.9*  HCT 38.3 37.4 37.0  MCV 100.3* 100.3* 102.2*  PLT 450* 413* 441*    Basic Metabolic Panel: Recent Labs  Lab 06/02/17 2300 06/03/17 0550 06/04/17 0531 06/05/17 0450  NA 143 141 141 142  K 3.4* 3.6 4.2 4.0  CL 111 110 111 109  CO2 22 24 25 29   GLUCOSE 112* 98 103* 99  BUN 11 9 7  5*  CREATININE 1.01* 0.89 1.00 1.03*  CALCIUM 8.7* 8.4* 9.0 8.7*  MG  --  1.9  --   --     Recent Results (from the past 240 hour(s))  Culture, blood (Routine X 2) w Reflex to ID Panel     Status: None (Preliminary result)   Collection Time: 06/03/17  2:03 AM  Result Value Ref Range Status   Specimen Description BLOOD RIGHT ANTECUBITAL  Final   Special Requests   Final    BOTTLES DRAWN AEROBIC AND ANAEROBIC Blood Culture adequate volume   Culture   Final    NO GROWTH 3 DAYS Performed at [facility], [address] [place], [place] [address]    Report Status PENDING  Incomplete  Culture, blood (Routine X 2) w Reflex to ID Panel     Status: None (Preliminary result)   Collection Time: 06/03/17  2:08 AM  Result Value Ref Range Status   Specimen Description BLOOD LEFT WRIST  Final   Special Requests IN PEDIATRIC BOTTLE Blood Culture adequate volume  Final   Culture   Final    NO GROWTH 3 DAYS Performed at [facility], [place] [address], [place], [place] [address]    Report Status PENDING  Incomplete  Urine Culture     Status: Abnormal   Collection Time: 06/03/17  6:50 AM  Result Value Ref Range Status   Specimen Description URINE, CLEAN CATCH   Final   Special Requests NONE  Final   Culture (A)  Final    <10,000 COLONIES/mL INSIGNIFICANT GROWTH Performed at [facility], [place] [address] [place], [place] [address]    Report Status 06/04/2017 FINAL  Final     Liver Function Tests: Recent Labs  Lab 06/02/17 2300  AST 17  ALT 16  ALKPHOS 62  BILITOT 0.4  PROT 7.5  ALBUMIN 3.5   Recent Labs  Lab 06/02/17 2300  LIPASE 23   No results for input(s): AMMONIA in the last 168 hours.  Cardiac Enzymes: No results for input(s): CKTOTAL, CKMB, CKMBINDEX, TROPONINI in the last 168 hours. BNP (last 3 results) Recent Labs    01/11/17 1106 06/03/17 0550  BNP 10.4 17.5  ProBNP (last 3 results) No results for input(s): PROBNP in the last 8760 hours.    Studies: No results found.  Scheduled Meds: . amLODipine  10 mg Oral Daily  . aspirin EC  81 mg Oral Daily  . atorvastatin  10 mg Oral Daily  . chlorhexidine  15 mL Mouth Rinse BID  . dicyclomine  20 mg Oral TID AC & HS  . enoxaparin (LOVENOX) injection  40 mg Subcutaneous Q24H  . feeding supplement (ENSURE ENLIVE)  237 mL Oral BID BM  . fluticasone  2 spray Each Nare Daily  . mouth rinse  15 mL Mouth Rinse q12n4p  . metoprolol succinate  50 mg Oral QHS  . mometasone-formoterol  2 puff Inhalation BID  . pantoprazole  40 mg Oral Daily  . saccharomyces boulardii  250 mg Oral BID  . simethicone  80 mg Oral QID  . topiramate  50 mg Oral BID      Time spent: 25 min  [facility] Pager [phone]. If 7PM-7AM, please contact night-coverage at [email or web address], Office  [phone]  password [email or web address]  06/07/2017, 11:58 AM  LOS: 4 days

## 2017-06-08 LAB — BASIC METABOLIC PANEL
ANION GAP: 5 (ref 5–15)
BUN: 9 mg/dL (ref 6–20)
CO2: 26 mmol/L (ref 22–32)
Calcium: 8.5 mg/dL — ABNORMAL LOW (ref 8.9–10.3)
Chloride: 109 mmol/L (ref 101–111)
Creatinine, Ser: 0.89 mg/dL (ref 0.44–1.00)
GFR calc Af Amer: >60 mL/min (ref >60)
Glucose, Bld: 103 mg/dL — ABNORMAL HIGH (ref 65–99)
POTASSIUM: 3.8 mmol/L (ref 3.5–5.1)
SODIUM: 140 mmol/L (ref 135–145)

## 2017-06-08 LAB — CBC
HCT: 36.9 % (ref 36.0–46.0)
Hemoglobin: 11.8 g/dL — ABNORMAL LOW (ref 12.0–15.0)
MCH: 32.2 pg (ref 26.0–34.0)
MCHC: 32 g/dL (ref 30.0–36.0)
MCV: 100.8 fL — AB (ref 78.0–100.0)
PLATELETS: 335 10*3/uL (ref 150–400)
RBC: 3.66 MIL/uL — AB (ref 3.87–5.11)
RDW: 14.2 % (ref 11.5–15.5)
WBC: 7.2 10*3/uL (ref 4.0–10.5)

## 2017-06-08 LAB — CULTURE, BLOOD (ROUTINE X 2)
CULTURE: NO GROWTH
Culture: NO GROWTH
SPECIAL REQUESTS: ADEQUATE
Special Requests: ADEQUATE

## 2017-06-08 MED ORDER — ERTAPENEM SODIUM 1 G IJ SOLR
1.0000 g | INTRAMUSCULAR | Status: DC
  Administered 2017-06-08 – 2017-06-11 (×4): 1 g via INTRAVENOUS
  Filled 2017-06-08 (×5): qty 1

## 2017-06-08 NOTE — Progress Notes (Signed)
Progress Note   Subjective  Chief Complaint: Abdominal pain  This morning, the patient tells me that she has had no change in her symptoms since time of her initial admission.  Patient continues to describe severe, diffuse lower abdominal pain which she describes as a "labor pain", which "bends me over and I get all hot and sweaty".  Patient tells me that this can happen with or without food but is definitely worse if she eats "anything".  Patient tells me it does not seem to matter if she eats soft, liquids or other.  Apparently she had a soft meal for breakfast yesterday and actually had nausea and vomiting.  Her diet was then changed to clear liquids but continues to give her abdominal pain.  She also continues with intermittent nausea off and on which can be unrelated to eating as well.  She denies any recent diarrhea.  Of note patient tells me that her husband is sick and "dying" from colon cancer.   Objective   Vital signs in last 24 hours: Temp:  [98.2 F (36.8 C)-98.8 F (37.1 C)] 98.8 F (37.1 C) (12/03 0609) Pulse Rate:  [55-71] 55 (12/03 0609) Resp:  [17-18] 17 (12/03 0609) BP: (107-127)/(56-62) 107/62 (12/03 0609) SpO2:  [91 %-96 %] 91 % (12/03 0609) Weight:  [224 lb 13.9 oz (102 kg)] 224 lb 13.9 oz (102 kg) (12/03 0609) Last BM Date: 06/06/17 General:   Overweight Caucasian female in NAD Heart:  Regular rate and rhythm; no murmurs Lungs: Respirations even and unlabored, lungs CTA bilaterally Abdomen:  Soft, mild generalized ttp and nondistended. Normal bowel sounds. Extremities:  Without edema. Neurologic:  Alert and oriented,  grossly normal neurologically. Psych:  Cooperative. Normal mood and affect.  Lab Results: Recent Labs    06/08/17 0424  WBC 7.2  HGB 11.8*  HCT 36.9  PLT 335   BMET Recent Labs    06/08/17 0424  NA 140  K 3.8  CL 109  CO2 26  GLUCOSE 103*  BUN 9  CREATININE 0.89  CALCIUM 8.5*       Assessment / Plan:   Assessment: 1.  Diverticulitis vs colitis: Patient had a recent admission for sigmoid diverticulitis, readmitted with recurrent/ongoing symptoms, CT 11/28 showed focal wall thickening with moderate surrounding inflammation of the sigmoid colon, diverticulitis versus colitis at the splenic flexure and descending colon possibly infectious or inflammatory bowel disease, last colonoscopy 08/26/16 with Dr. Havery Moros which showed diverticulosis in the transverse colon and in the left colon, patient has had documented episodes of recurrent/persistent diverticulitis by imaging several times over the last 1/2 years, not improving during this current admission  Plan: 1. Patient was switched from cipro/flagyl (06/03/17-06/08/17) to [place] today 2. Continue clear liquids for now 3.  Will discuss with Dr. Ardis Hughs today, per Dr. Lynne Leader last note, could consider colonoscopy/flex sig/repeat CT scan 4.  Please await final recommendations from Dr. Ardis Hughs 5.  Appreciate surgical team's recommendations  Thank you for this kind consultation, we will continue to follow.   LOS: 5 days   Levin Erp  06/08/2017, 9:43 AM  Pager # [phone]   ________________________________________________________________________  Velora Heckler GI MD note:  I personally examined the patient, reviewed the data and agree with the assessment and plan described above.  I think most likely this is smoldering diverticulitis rather than a new diagnosis of IBD, colitis.  Surgery changed her Abx to invanz and we'll see if that makes a clinical difference. If she is not  improving from a pain standpoint by this Wednesday then I will proceed with colonoscopy, biopsies to attempt to prove if something other than diverticulitis is going on here.     Owens Loffler, MD [facility] Pager [phone]

## 2017-06-08 NOTE — Progress Notes (Signed)
Patient ID: Ann [name], female   DOB: 1956/01/23, 61 y.o.   MRN: [id number]  [facility] Surgery Progress Note     Subjective: CC- abdominal pain Patient states that she has made no improvements since admission. Continues to have severe, diffuse lower abdominal pain. She reports increased pain with PO intake and when she has a bowel movement. She got nauseated and vomited with soft diet for breakfast yesterday; now on clears but afraid to eat. No recent diarrhea. 1 BM yesterday was soft.   Objective: Vital signs in last 24 hours: Temp:  [98.2 F (36.8 C)-98.8 F (37.1 C)] 98.8 F (37.1 C) (12/03 0609) Pulse Rate:  [55-71] 55 (12/03 0609) Resp:  [17-18] 17 (12/03 0609) BP: (107-127)/(56-62) 107/62 (12/03 0609) SpO2:  [91 %-96 %] 91 % (12/03 0609) Weight:  [224 lb 13.9 oz (102 kg)] 224 lb 13.9 oz (102 kg) (12/03 0609) Last BM Date: 06/06/17  Intake/Output from previous day: 12/02 0701 - 12/03 0700 In: 890 [P.O.:480; IV Piggyback:300] Out: 950 [Urine:950] Intake/Output this shift: No intake/output data recorded.  PE: Gen:  Alert, NAD HEENT: EOM's intact, pupils equal and round Card:  RRR, no M/G/R heard Pulm:  CTAB, no W/R/R, effort normal Abd: Soft, ND, nontender (although patient reports subjective pain with lower abdominal palpation), +BS Ext:  No erythema, edema, or tenderness BUE/BLE  Psych: A&Ox3  Skin: no rashes noted, warm and dry  Lab Results:  Recent Labs    06/08/17 0424  WBC 7.2  HGB 11.8*  HCT 36.9  PLT 335   BMET Recent Labs    06/08/17 0424  NA 140  K 3.8  CL 109  CO2 26  GLUCOSE 103*  BUN 9  CREATININE 0.89  CALCIUM 8.5*   PT/INR No results for input(s): LABPROT, INR in the last 72 hours. CMP     Component Value Date/Time   NA 140 06/08/2017 0424   K 3.8 06/08/2017 0424   CL 109 06/08/2017 0424   CO2 26 06/08/2017 0424   GLUCOSE 103 (H) 06/08/2017 0424   BUN 9 06/08/2017 0424   CREATININE 0.89 06/08/2017 0424   CALCIUM 8.5  (L) 06/08/2017 0424   PROT 7.5 06/02/2017 2300   ALBUMIN 3.5 06/02/2017 2300   AST 17 06/02/2017 2300   ALT 16 06/02/2017 2300   ALKPHOS 62 06/02/2017 2300   BILITOT 0.4 06/02/2017 2300   GFRNONAA >60 06/08/2017 0424   GFRAA >60 06/08/2017 0424   Lipase     Component Value Date/Time   LIPASE 23 06/02/2017 2300       Studies/Results: No results found.  Anti-infectives: Anti-infectives (From admission, onward)   Start     Dose/Rate Route Frequency Ordered Stop   06/03/17 1200  ciprofloxacin (CIPRO) IVPB 400 mg     400 mg 200 mL/hr over 60 Minutes Intravenous Every 12 hours 06/03/17 0207     06/03/17 0445  metroNIDAZOLE (FLAGYL) IVPB 500 mg     500 mg 100 mL/hr over 60 Minutes Intravenous Every 8 hours 06/03/17 0200     06/03/17 0100  ciprofloxacin (CIPRO) IVPB 400 mg     400 mg 200 mL/hr over 60 Minutes Intravenous  Once 06/03/17 0055 06/03/17 0333   06/03/17 0100  metroNIDAZOLE (FLAGYL) IVPB 500 mg  Status:  Discontinued     500 mg 100 mL/hr over 60 Minutes Intravenous  Once 06/03/17 0055 06/03/17 0200       Assessment/Plan HTN H/o CVA GERD OSA Chronic repsiratory failure - on  O2 at home Morbid obesity Anxiety Combined systolic and diastolic CHF  Diverticulitis vs colitis - last CT 11/28 showed focal wall thickening with moderate surrounding inflammation at the sigmoid colon, diverticulitis vs colitis at the splenic flexure and descending colon possibly 2/2 infectious or inflammatory bowel disease - last colonoscopy 08/26/2016 Dr. Havery Moros showed diverticulosis in the transverse colon and in the left colon - WBC 7.2, afebrile  ID - cipro/flagyl 11/28>>12/3, invanz 12/3>>day#1 FEN - IVF, CLD VTE - SCDs, lovenox Follow up - Dr. Ninfa [name]  Plan - Patient continues to complain of diffuse lower abdominal pain. She is minimally tender on exam. WBC WNL and VSS. Will change antibiotics to Invanz. Continue clear liquids. Appreciate GI input.   LOS: 5 days     Wellington Hampshire , [facility] Surgery 06/08/2017, 8:47 AM Pager: [phone] Consults: [phone] Mon-Fri 7:00 am-4:30 pm Sat-Sun 7:00 am-11:30 am

## 2017-06-08 NOTE — Progress Notes (Signed)
[facility]  PROGRESS NOTE  Ann [name] YOV:[id number] DOB: 07-08-1955 DOA: 06/02/2017 PCP: Berkley Harvey, NP   Brief HPI:    61 y.o.femalewith medical history significant ofrecurrent diverticulitis, hypertension, hyperlipidemia, stroke, TIA, GERD, anxiety, OSA on CPAP, obesity, CHF with EF of 45-50 percent, chronic respiratory failure on 4 L oxygen at home, who presents with nausea, vomiting, diarrhea and lower abdominal pain. Patient was just discharged from the hospital on 05/28/2017.  She was supposed to follow-up surgery for possible hemicolectomy as outpatient   Subjective   Patient seen and examined, diarrhea has improved.  Continues to have crampy abdominal pain.  Assessment/Plan:     1. Recurrent diverticulitis/colitis-diet again changed to liquid per GI.CT scan shows sigmoid colon with colitis and possible colitis and splenic flexure and descending colon.  No perforation or abscess.  GI was consulted.  Plan is for treating for infectious etiology.  Stool for C. difficile is currently pending.  Antibiotics changed from Cipro/Flagyl  to [facility] general surgery was consulted.  No plan for immediate surgery at this time.  Diet has been advanced to soft diet. 2. Hypertension- blood pressure controlled, continue amlodipine, metoprolol. 3. Chronic respiratory failure with hypoxia- Patient on 4 L of nasal cannula with oxygen at home continue as needed albuterol. 4. Dysuria-patient complained of dysuria.  Urine culture showed insignificant growth. 5. Hypertension-continue Lipitor 6. History of stroke-continue aspirin, Lipitor. 7. Anxiety-continue as needed Klonopin 8. Chronic combined systolic and diastolic CHF-well compensated.  Lasix on hold.    DVT prophylaxis: Lovenox  Code Status: Full code  Family Communication: No family bedside  Disposition Plan: Likely home in 2-3 days.   Consultants:  Gastroenterology  Procedures:  None  Continuous infusions .  ertapenem Stopped (06/08/17 1706)      Antibiotics:   Anti-infectives (From admission, onward)   Start     Dose/Rate Route Frequency Ordered Stop   06/08/17 1000  ertapenem (INVANZ) 1 g in sodium chloride 0.9 % 50 mL IVPB     1 g 100 mL/hr over 30 Minutes Intravenous Every 24 hours 06/08/17 0930     06/03/17 1200  ciprofloxacin (CIPRO) IVPB 400 mg  Status:  Discontinued     400 mg 200 mL/hr over 60 Minutes Intravenous Every 12 hours 06/03/17 0207 06/08/17 0930   06/03/17 0445  metroNIDAZOLE (FLAGYL) IVPB 500 mg  Status:  Discontinued     500 mg 100 mL/hr over 60 Minutes Intravenous Every 8 hours 06/03/17 0200 06/08/17 0930   06/03/17 0100  ciprofloxacin (CIPRO) IVPB 400 mg     400 mg 200 mL/hr over 60 Minutes Intravenous  Once 06/03/17 0055 06/03/17 0333   06/03/17 0100  metroNIDAZOLE (FLAGYL) IVPB 500 mg  Status:  Discontinued     500 mg 100 mL/hr over 60 Minutes Intravenous  Once 06/03/17 0055 06/03/17 0200       Objective   Vitals:   06/07/17 2050 06/08/17 0609 06/08/17 1352 06/08/17 1353  BP: 127/62 107/62 (!) 96/59 96/60  Pulse: 71 (!) 55 61   Resp: 18 17    Temp: 98.6 F (37 C) 98.8 F (37.1 C) 98.2 F (36.8 C)   TempSrc: Oral Oral Oral   SpO2: 96% 91% 98%   Weight:  102 kg (224 lb 13.9 oz)    Height:        Intake/Output Summary (Last 24 hours) at 06/08/2017 1710 Last data filed at 06/08/2017 7412 Gross per 24 hour  Intake 40 ml  Output -  Net 40 ml   Filed Weights   06/06/17 0502 06/07/17 0416 06/08/17 0609  Weight: 104.1 kg (229 lb 8 oz) 102.9 kg (226 lb 13.7 oz) 102 kg (224 lb 13.9 oz)     Physical Examination:  Physical Exam: Eyes: No icterus, extraocular muscles intact  Mouth: Oral mucosa is moist, no lesions on palate,  Neck: Supple, no deformities, masses, or tenderness Lungs: Normal respiratory effort, bilateral clear to auscultation, no crackles or wheezes.  Heart: Regular rate and rhythm, S1 and S2 normal, no murmurs, rubs  auscultated Abdomen: BS normoactive,soft,nondistended, generalized tenderness to palpation ,no organomegaly Extremities: No pretibial edema, no erythema, no cyanosis, no clubbing Neuro : Alert and oriented to time, place and person, No focal deficits Skin: No rashes seen on exam    Data Reviewed: I have personally reviewed following labs and imaging studies  CBG: No results for input(s): GLUCAP in the last 168 hours.  CBC: Recent Labs  Lab 06/02/17 2300 06/03/17 0550 06/04/17 0531 06/08/17 0424  WBC 6.3 5.9 6.4 7.2  HGB 12.6 12.1 11.9* 11.8*  HCT 38.3 37.4 37.0 36.9  MCV 100.3* 100.3* 102.2* 100.8*  PLT 450* 413* 441* 702    Basic Metabolic Panel: Recent Labs  Lab 06/02/17 2300 06/03/17 0550 06/04/17 0531 06/05/17 0450 06/08/17 0424  NA 143 141 141 142 140  K 3.4* 3.6 4.2 4.0 3.8  CL 111 110 111 109 109  CO2 22 24 25 29 26   GLUCOSE 112* 98 103* 99 103*  BUN 11 9 7  5* 9  CREATININE 1.01* 0.89 1.00 1.03* 0.89  CALCIUM 8.7* 8.4* 9.0 8.7* 8.5*  MG  --  1.9  --   --   --     Recent Results (from the past 240 hour(s))  Culture, blood (Routine X 2) w Reflex to ID Panel     Status: None   Collection Time: 06/03/17  2:03 AM  Result Value Ref Range Status   Specimen Description BLOOD RIGHT ANTECUBITAL  Final   Special Requests   Final    BOTTLES DRAWN AEROBIC AND ANAEROBIC Blood Culture adequate volume   Culture   Final    NO GROWTH 5 DAYS Performed at [facility], [address], [place], [place] [address]    Report Status 06/08/2017 FINAL  Final  Culture, blood (Routine X 2) w Reflex to ID Panel     Status: None   Collection Time: 06/03/17  2:08 AM  Result Value Ref Range Status   Specimen Description BLOOD LEFT WRIST  Final   Special Requests IN PEDIATRIC BOTTLE Blood Culture adequate volume  Final   Culture   Final    NO GROWTH 5 DAYS Performed at [facility], [place] [address] [place], [place] [address]    Report Status 06/08/2017 FINAL  Final   Urine Culture     Status: Abnormal   Collection Time: 06/03/17  6:50 AM  Result Value Ref Range Status   Specimen Description URINE, CLEAN CATCH  Final   Special Requests NONE  Final   Culture (A)  Final    <10,000 COLONIES/mL INSIGNIFICANT GROWTH Performed at [facility], [place] [address] [place], [place] [address]    Report Status 06/04/2017 FINAL  Final     Liver Function Tests: Recent Labs  Lab 06/02/17 2300  AST 17  ALT 16  ALKPHOS 62  BILITOT 0.4  PROT 7.5  ALBUMIN 3.5   Recent Labs  Lab 06/02/17 2300  LIPASE 23   No results for input(s): AMMONIA in the last 168 hours.  Cardiac Enzymes: No results for input(s): CKTOTAL, CKMB, CKMBINDEX, TROPONINI in the last 168 hours. BNP (last 3 results) Recent Labs    01/11/17 1106 06/03/17 0550  BNP 10.4 17.5    ProBNP (last 3 results) No results for input(s): PROBNP in the last 8760 hours.    Studies: No results found.  Scheduled Meds: . amLODipine  10 mg Oral Daily  . aspirin EC  81 mg Oral Daily  . atorvastatin  10 mg Oral Daily  . chlorhexidine  15 mL Mouth Rinse BID  . dicyclomine  20 mg Oral TID AC & HS  . enoxaparin (LOVENOX) injection  40 mg Subcutaneous Q24H  . feeding supplement (ENSURE ENLIVE)  237 mL Oral BID BM  . fluticasone  2 spray Each Nare Daily  . mouth rinse  15 mL Mouth Rinse q12n4p  . metoprolol succinate  50 mg Oral QHS  . mometasone-formoterol  2 puff Inhalation BID  . pantoprazole  40 mg Oral Daily  . saccharomyces boulardii  250 mg Oral BID  . simethicone  80 mg Oral QID  . topiramate  50 mg Oral BID      Time spent: 25 min  [facility] Pager [phone]. If 7PM-7AM, please contact night-coverage at [email or web address], Office  [phone]  password [email or web address]  06/08/2017, 5:10 PM  LOS: 5 days

## 2017-06-09 ENCOUNTER — Encounter (HOSPITAL_COMMUNITY): Payer: Self-pay | Admitting: Radiology

## 2017-06-09 ENCOUNTER — Inpatient Hospital Stay (HOSPITAL_COMMUNITY): Payer: Medicaid Other

## 2017-06-09 MED ORDER — SODIUM CHLORIDE 0.45 % IV SOLN
INTRAVENOUS | Status: DC
  Administered 2017-06-09 – 2017-06-11 (×3): via INTRAVENOUS

## 2017-06-09 MED ORDER — IOPAMIDOL (ISOVUE-300) INJECTION 61%: 30.0000 mL | Freq: Once | INTRAVENOUS | Status: DC | PRN

## 2017-06-09 MED ORDER — SODIUM CHLORIDE 0.45 % IV SOLN: INTRAVENOUS | Status: DC

## 2017-06-09 MED ORDER — IOPAMIDOL (ISOVUE-300) INJECTION 61%
INTRAVENOUS | Status: AC
  Filled 2017-06-09: qty 30

## 2017-06-09 MED ORDER — IOPAMIDOL (ISOVUE-300) INJECTION 61%
INTRAVENOUS | Status: AC
  Administered 2017-06-09: 100 mL via INTRAVENOUS
  Filled 2017-06-09: qty 100

## 2017-06-09 NOTE — Progress Notes (Signed)
Pt plan to discharge home with no needs at present time.

## 2017-06-09 NOTE — Progress Notes (Signed)
Patient ID: Ann [name], female   DOB: [birth year], 61 y.o.   MRN: [id number]  [facility] Surgery Progress Note     Subjective: CC- lower abdominal pain Patient states that she feels no different. Continues to have diffuse lower abdominal pain. Worse when she has a BM. States that she had a mucousy BM this morning. She reports nausea, no emesis. Not taking in many clears because she is afraid that it is going to make her pain worse.  Objective: Vital signs in last 24 hours: Temp:  [97.8 F (36.6 C)-98.3 F (36.8 C)] 97.8 F (36.6 C) (12/04 0438) Pulse Rate:  [54-76] 54 (12/04 0438) Resp:  [18] 18 (12/04 0438) BP: (94-117)/(59-60) 94/60 (12/04 0438) SpO2:  [95 %-98 %] 95 % (12/04 0438) Weight:  [224 lb 10.4 oz (101.9 kg)] 224 lb 10.4 oz (101.9 kg) (12/04 0500) Last BM Date: 06/08/17  Intake/Output from previous day: 12/03 0701 - 12/04 0700 In: 410 [P.O.:360; IV Piggyback:50] Out: 1000 [Urine:1000] Intake/Output this shift: No intake/output data recorded.  PE: Gen:  Alert, NAD HEENT: EOM's intact, pupils equal and round Card:  RRR, no M/G/R heard Pulm:  CTAB, no W/R/R, effort normal Abd: Soft, ND, nontender/subjective TTP lower abdomen, +BS Ext:  No erythema, edema, or tenderness BUE/BLE  Psych: A&Ox3  Skin: no rashes noted, warm and dry  Lab Results:  Recent Labs    06/08/17 0424  WBC 7.2  HGB 11.8*  HCT 36.9  PLT 335   BMET Recent Labs    06/08/17 0424  NA 140  K 3.8  CL 109  CO2 26  GLUCOSE 103*  BUN 9  CREATININE 0.89  CALCIUM 8.5*   PT/INR No results for input(s): LABPROT, INR in the last 72 hours. CMP     Component Value Date/Time   NA 140 06/08/2017 0424   K 3.8 06/08/2017 0424   CL 109 06/08/2017 0424   CO2 26 06/08/2017 0424   GLUCOSE 103 (H) 06/08/2017 0424   BUN 9 06/08/2017 0424   CREATININE 0.89 06/08/2017 0424   CALCIUM 8.5 (L) 06/08/2017 0424   PROT 7.5 06/02/2017 2300   ALBUMIN 3.5 06/02/2017 2300   AST 17 06/02/2017 2300    ALT 16 06/02/2017 2300   ALKPHOS 62 06/02/2017 2300   BILITOT 0.4 06/02/2017 2300   GFRNONAA >60 06/08/2017 0424   GFRAA >60 06/08/2017 0424   Lipase     Component Value Date/Time   LIPASE 23 06/02/2017 2300       Studies/Results: No results found.  Anti-infectives: Anti-infectives (From admission, onward)   Start     Dose/Rate Route Frequency Ordered Stop   06/08/17 1000  ertapenem (INVANZ) 1 g in sodium chloride 0.9 % 50 mL IVPB     1 g 100 mL/hr over 30 Minutes Intravenous Every 24 hours 06/08/17 0930     06/03/17 1200  ciprofloxacin (CIPRO) IVPB 400 mg  Status:  Discontinued     400 mg 200 mL/hr over 60 Minutes Intravenous Every 12 hours 06/03/17 0207 06/08/17 0930   06/03/17 0445  metroNIDAZOLE (FLAGYL) IVPB 500 mg  Status:  Discontinued     500 mg 100 mL/hr over 60 Minutes Intravenous Every 8 hours 06/03/17 0200 06/08/17 0930   06/03/17 0100  ciprofloxacin (CIPRO) IVPB 400 mg     400 mg 200 mL/hr over 60 Minutes Intravenous  Once 06/03/17 0055 06/03/17 0333   06/03/17 0100  metroNIDAZOLE (FLAGYL) IVPB 500 mg  Status:  Discontinued  500 mg 100 mL/hr over 60 Minutes Intravenous  Once 06/03/17 0055 06/03/17 0200       Assessment/Plan HTN H/o CVA GERD OSA Chronic repsiratory failure - on O2 at home Morbid obesity Anxiety Combined systolic and diastolic CHF  Diverticulitis vs colitis - last CT 11/28 showed focal wall thickening with moderate surrounding inflammation at the sigmoid colon, diverticulitis vs colitis at the splenic flexure and descending colon possibly 2/2 infectious or inflammatory bowel disease - last colonoscopy 08/26/2016 Dr. Havery Moros showed diverticulosis in the transverse colon and in the left colon - afebrile  ID - cipro/flagyl 11/28>>12/3, invanz 12/3>>day#2 FEN - IVF, CLD, Ensure VTE - SCDs, lovenox  Plan - No subjective improvement from yesterday. Continue Invanz for now.  Will repeat CT scan today. GI considering  colonoscopy with biopsies tomorrow if no change in symptoms.   LOS: 6 days    Wellington Hampshire , [facility] Surgery 06/09/2017, 8:21 AM Pager: [phone] Consults: [phone] Mon-Fri 7:00 am-4:30 pm Sat-Sun 7:00 am-11:30 am

## 2017-06-09 NOTE — Progress Notes (Signed)
[facility]  PROGRESS NOTE  Ann [name] QQI:[id number] DOB: [birth year] DOA: 06/02/2017 PCP: Berkley Harvey, NP   Brief HPI:    61 y.o.femalewith medical history significant ofrecurrent diverticulitis, hypertension, hyperlipidemia, stroke, TIA, GERD, anxiety, OSA on CPAP, obesity, CHF with EF of 45-50 percent, chronic respiratory failure on 4 L oxygen at home, who presents with nausea, vomiting, diarrhea and lower abdominal pain. Patient was just discharged from the hospital on 05/28/2017.  She was supposed to follow-up surgery for possible hemicolectomy as outpatient   Subjective   Patient seen and examined, complains of worsening abdominal pain.  Complains of passing mucus and pus per rectum this morning.  Also had nausea but no vomiting.   Assessment/Plan:     1. Recurrent diverticulitis/colitis-diet again changed to liquid per GI.CT scan shows sigmoid colon with colitis and possible colitis and splenic flexure and descending colon.  No perforation or abscess.  GI was consulted.  Plan is for treating for infectious etiology.  Stool for C. difficile is currently pending.  Antibiotics changed from Cipro/Flagyl  to [facility] general surgery was consulted.  No plan for immediate surgery at this time.  GI and surgery are following.  Plan for CT scan today and colonoscopy in the next 1-2 days if no improvement. 2. Hypertension- blood pressure controlled, continue amlodipine, metoprolol. 3. Chronic respiratory failure with hypoxia- Patient on 4 L of nasal cannula with oxygen at home continue as needed albuterol. 4. Dysuria-patient complained of dysuria.  Urine culture showed insignificant growth. 5. Hypertension-continue Lipitor 6. History of stroke-continue aspirin, Lipitor. 7. Anxiety-continue as needed Klonopin 8. Chronic combined systolic and diastolic CHF-well compensated.  Lasix on hold.    DVT prophylaxis: Lovenox  Code Status: Full code  Family Communication: No family  bedside  Disposition Plan: Likely home in 2-3 days.   Consultants:  Gastroenterology  Procedures:  None  Continuous infusions . sodium chloride 50 mL/hr at 06/09/17 0923  . ertapenem Stopped (06/09/17 1156)      Antibiotics:   Anti-infectives (From admission, onward)   Start     Dose/Rate Route Frequency Ordered Stop   06/08/17 1000  ertapenem (INVANZ) 1 g in sodium chloride 0.9 % 50 mL IVPB     1 g 100 mL/hr over 30 Minutes Intravenous Every 24 hours 06/08/17 0930     06/03/17 1200  ciprofloxacin (CIPRO) IVPB 400 mg  Status:  Discontinued     400 mg 200 mL/hr over 60 Minutes Intravenous Every 12 hours 06/03/17 0207 06/08/17 0930   06/03/17 0445  metroNIDAZOLE (FLAGYL) IVPB 500 mg  Status:  Discontinued     500 mg 100 mL/hr over 60 Minutes Intravenous Every 8 hours 06/03/17 0200 06/08/17 0930   06/03/17 0100  ciprofloxacin (CIPRO) IVPB 400 mg     400 mg 200 mL/hr over 60 Minutes Intravenous  Once 06/03/17 0055 06/03/17 0333   06/03/17 0100  metroNIDAZOLE (FLAGYL) IVPB 500 mg  Status:  Discontinued     500 mg 100 mL/hr over 60 Minutes Intravenous  Once 06/03/17 0055 06/03/17 0200       Objective   Vitals:   06/08/17 2106 06/09/17 0438 06/09/17 0500 06/09/17 1111  BP: (!) 117/59 94/60  109/70  Pulse: 76 (!) 54  64  Resp: 18 18    Temp: 98.3 F (36.8 C) 97.8 F (36.6 C)  98.1 F (36.7 C)  TempSrc: Oral Oral  Oral  SpO2: 95% 95%  95%  Weight:   101.9 kg (224 lb 10.4  oz)   Height:        Intake/Output Summary (Last 24 hours) at 06/09/2017 1410 Last data filed at 06/09/2017 8676 Gross per 24 hour  Intake 170 ml  Output 1000 ml  Net -830 ml   Filed Weights   06/07/17 0416 06/08/17 0609 06/09/17 0500  Weight: 102.9 kg (226 lb 13.7 oz) 102 kg (224 lb 13.9 oz) 101.9 kg (224 lb 10.4 oz)     Physical Examination:  Physical Exam: Eyes: No icterus, extraocular muscles intact  Mouth: Oral mucosa is moist, no lesions on palate,  Neck: Supple, no  deformities, masses, or tenderness Lungs: Normal respiratory effort, bilateral clear to auscultation, no crackles or wheezes.  Heart: Regular rate and rhythm, S1 and S2 normal, no murmurs, rubs auscultated Abdomen: BS normoactive,soft,nondistended, tender to palpation both right and left lower quadrants. Extremities: No pretibial edema, no erythema, no cyanosis, no clubbing Neuro : Alert and oriented to time, place and person, No focal deficits Skin: No rashes seen on exam    Data Reviewed: I have personally reviewed following labs and imaging studies  CBG: No results for input(s): GLUCAP in the last 168 hours.  CBC: Recent Labs  Lab 06/02/17 2300 06/03/17 0550 06/04/17 0531 06/08/17 0424  WBC 6.3 5.9 6.4 7.2  HGB 12.6 12.1 11.9* 11.8*  HCT 38.3 37.4 37.0 36.9  MCV 100.3* 100.3* 102.2* 100.8*  PLT 450* 413* 441* 720    Basic Metabolic Panel: Recent Labs  Lab 06/02/17 2300 06/03/17 0550 06/04/17 0531 06/05/17 0450 06/08/17 0424  NA 143 141 141 142 140  K 3.4* 3.6 4.2 4.0 3.8  CL 111 110 111 109 109  CO2 22 24 25 29 26   GLUCOSE 112* 98 103* 99 103*  BUN 11 9 7  5* 9  CREATININE 1.01* 0.89 1.00 1.03* 0.89  CALCIUM 8.7* 8.4* 9.0 8.7* 8.5*  MG  --  1.9  --   --   --     Recent Results (from the past 240 hour(s))  Culture, blood (Routine X 2) w Reflex to ID Panel     Status: None   Collection Time: 06/03/17  2:03 AM  Result Value Ref Range Status   Specimen Description BLOOD RIGHT ANTECUBITAL  Final   Special Requests   Final    BOTTLES DRAWN AEROBIC AND ANAEROBIC Blood Culture adequate volume   Culture   Final    NO GROWTH 5 DAYS Performed at [facility], [address], [place], [place] [address]    Report Status 06/08/2017 FINAL  Final  Culture, blood (Routine X 2) w Reflex to ID Panel     Status: None   Collection Time: 06/03/17  2:08 AM  Result Value Ref Range Status   Specimen Description BLOOD LEFT WRIST  Final   Special Requests IN PEDIATRIC  BOTTLE Blood Culture adequate volume  Final   Culture   Final    NO GROWTH 5 DAYS Performed at [facility], [place] [address] [place], [place] [address]    Report Status 06/08/2017 FINAL  Final  Urine Culture     Status: Abnormal   Collection Time: 06/03/17  6:50 AM  Result Value Ref Range Status   Specimen Description URINE, CLEAN CATCH  Final   Special Requests NONE  Final   Culture (A)  Final    <10,000 COLONIES/mL INSIGNIFICANT GROWTH Performed at [facility], [place] [address], [place], [place] [address]    Report Status 06/04/2017 FINAL  Final     Liver Function Tests: Recent Labs  Lab 06/02/17 2300  AST 17  ALT 16  ALKPHOS 62  BILITOT 0.4  PROT 7.5  ALBUMIN 3.5   Recent Labs  Lab 06/02/17 2300  LIPASE 23   No results for input(s): AMMONIA in the last 168 hours.  Cardiac Enzymes: No results for input(s): CKTOTAL, CKMB, CKMBINDEX, TROPONINI in the last 168 hours. BNP (last 3 results) Recent Labs    01/11/17 1106 06/03/17 0550  BNP 10.4 17.5    ProBNP (last 3 results) No results for input(s): PROBNP in the last 8760 hours.    Studies: No results found.  Scheduled Meds: . amLODipine  10 mg Oral Daily  . aspirin EC  81 mg Oral Daily  . atorvastatin  10 mg Oral Daily  . chlorhexidine  15 mL Mouth Rinse BID  . dicyclomine  20 mg Oral TID AC & HS  . enoxaparin (LOVENOX) injection  40 mg Subcutaneous Q24H  . feeding supplement (ENSURE ENLIVE)  237 mL Oral BID BM  . fluticasone  2 spray Each Nare Daily  . mouth rinse  15 mL Mouth Rinse q12n4p  . metoprolol succinate  50 mg Oral QHS  . mometasone-formoterol  2 puff Inhalation BID  . pantoprazole  40 mg Oral Daily  . saccharomyces boulardii  250 mg Oral BID  . simethicone  80 mg Oral QID  . topiramate  50 mg Oral BID      Time spent: 25 min  [facility] Pager [phone]. If 7PM-7AM, please contact night-coverage at [email or web address], Office  [phone]  password  [email or web address]  06/09/2017, 2:10 PM  LOS: 6 days

## 2017-06-09 NOTE — Progress Notes (Signed)
    Progress Note   Subjective  Chief Complaint: Abdominal Pain  This morning, the patient tells me there has been no change to her symptoms at all. She continues with a lower abdominal pain, worse after eating and with a BM.  Patient does describe passing loose stool this morning with a lot of "pus". She describes this as stringy and white. Patient also complains of some nausea but no vomiting.    Objective   Vital signs in last 24 hours: Temp:  [97.8 F (36.6 C)-98.3 F (36.8 C)] 97.8 F (36.6 C) (12/04 0438) Pulse Rate:  [54-76] 54 (12/04 0438) Resp:  [18] 18 (12/04 0438) BP: (94-117)/(59-60) 94/60 (12/04 0438) SpO2:  [95 %-98 %] 95 % (12/04 0438) Weight:  [224 lb 10.4 oz (101.9 kg)] 224 lb 10.4 oz (101.9 kg) (12/04 0500) Last BM Date: 06/08/17 General:    white female in NAD Heart:  Regular rate and rhythm; no murmurs Lungs: Respirations even and unlabored, lungs CTA bilaterally Abdomen:  Soft, mild generalized ttp and nondistended. Normal bowel sounds. Extremities:  Without edema. Neurologic:  Alert and oriented,  grossly normal neurologically. Psych:  Cooperative. Normal mood and affect.  Lab Results: Recent Labs    06/08/17 0424  WBC 7.2  HGB 11.8*  HCT 36.9  PLT 335   BMET Recent Labs    06/08/17 0424  NA 140  K 3.8  CL 109  CO2 26  GLUCOSE 103*  BUN 9  CREATININE 0.89  CALCIUM 8.5*     Assessment / Plan:   Assessment: 1. Diverticulitis: Patient had a recent admission for sigmoid diverticulitis, readmitted with recurrent/ongoing symptoms, CT 11/28 showed focal wall thickening with moderate surrounding inflammation of the sigmoid colon, diverticulitis versus colitis at the splenic flexure and descending colon possibly infectious or inflammatory bowel disease, last colonoscopy 08/26/16 with Dr. Havery Moros which showed diverticulosis in the transverse colon and in the left colon, patient has had documented episodes of recurrent/persistent diverticulitis by  imaging several times over the last 1/2 years, not improving during this current admission, changed to [facility] 06/08/17 from cipro/flagyl which she was on for 6 days; Question element of IBS/functional pain on top of diverticulitis  Plan: 1. No change to current recommendations, continue Invanz, day 2 2. Again, if continues with pain on Wednesday, will consider colonoscopy 3. Continue current diet 4. Please await any final recommendations from Dr. Ardis Hughs later today  Thank you for your kind consultation, we will continue to follow.   LOS: 6 days   Levin Erp  06/09/2017, 9:10 AM  Pager # [phone]   ________________________________________________________________________  Velora Heckler GI MD note:  I personally examined the patient, reviewed the data and agree with the assessment and plan described above.  Her pains are unusual and really not typical for IBD or diverticulitis but are not improving. She is currently sitting up in bed, bending forward a bit and is very comfortable. Laying back makes the pain worse in her lower abdomen.  She's been on IV abx for several days now, change to Invanz yesterday. General surgery ordered at CT scan, will review that when available.  Still consider colonoscopy evaluation pending clinical course, CT.  Owens Loffler, MD [facility] Gastroenterology Pager [phone]

## 2017-06-10 LAB — HEMOGLOBIN A1C
HEMOGLOBIN A1C: 5.6 % (ref 4.8–5.6)
MEAN PLASMA GLUCOSE: 114.02 mg/dL

## 2017-06-10 MED ORDER — CELECOXIB 200 MG PO CAPS
200.0000 mg | ORAL_CAPSULE | ORAL | Status: AC
  Administered 2017-06-11: 200 mg via ORAL
  Filled 2017-06-10: qty 1

## 2017-06-10 MED ORDER — CHLORHEXIDINE GLUCONATE 4 % EX LIQD
60.0000 mL | Freq: Once | CUTANEOUS | Status: AC
  Administered 2017-06-11: 4 via TOPICAL
  Filled 2017-06-10: qty 60

## 2017-06-10 MED ORDER — GABAPENTIN 300 MG PO CAPS
300.0000 mg | ORAL_CAPSULE | ORAL | Status: AC
  Administered 2017-06-11: 300 mg via ORAL

## 2017-06-10 MED ORDER — CHLORHEXIDINE GLUCONATE 4 % EX LIQD
60.0000 mL | Freq: Once | CUTANEOUS | Status: AC
  Administered 2017-06-10: 4 via TOPICAL
  Filled 2017-06-10: qty 60

## 2017-06-10 MED ORDER — ALVIMOPAN 12 MG PO CAPS
12.0000 mg | ORAL_CAPSULE | ORAL | Status: AC
  Administered 2017-06-11: 12 mg via ORAL

## 2017-06-10 MED ORDER — SODIUM CHLORIDE 0.9 % IV SOLN
INTRAVENOUS | Status: DC
  Filled 2017-06-10 (×2): qty 6

## 2017-06-10 MED ORDER — POLYETHYLENE GLYCOL 3350 17 GM/SCOOP PO POWD
1.0000 | Freq: Once | ORAL | Status: AC
  Administered 2017-06-10: 255 g via ORAL
  Filled 2017-06-10: qty 255

## 2017-06-10 MED ORDER — ACETAMINOPHEN 500 MG PO TABS
1000.0000 mg | ORAL_TABLET | ORAL | Status: AC
  Administered 2017-06-11: 1000 mg via ORAL

## 2017-06-10 MED ORDER — DEXTROSE 5 % IV SOLN
2.0000 g | INTRAVENOUS | Status: AC
  Administered 2017-06-11: 2 g via INTRAVENOUS
  Filled 2017-06-10: qty 2

## 2017-06-10 MED ORDER — NEOMYCIN SULFATE 500 MG PO TABS
1000.0000 mg | ORAL_TABLET | Freq: Three times a day (TID) | ORAL | Status: AC
  Administered 2017-06-10 (×2): 1000 mg via ORAL
  Filled 2017-06-10 (×3): qty 2

## 2017-06-10 NOTE — Consult Note (Signed)
[facility] Nurse ostomy consult note  [place] Nurse requested for preoperative stoma site marking by Dr. Barry Dienes.  Discussed surgical procedure and stoma creation with patient. Patient is familiar with the role of the [facility] nurse team; her husband has a colostomy performed approximately 10 weeks ago.  She is repulsed by her husband's ostomy and stool and does not perform ostomy care without gagging. She reports her husband has dementia and HHRNs from [facility] so she would like them to care for her at home post discharge, too. Answered patient questions about ileostomy versus colostomy.  She reports that she has OCD and that she is nervous about surgery.  She has a rapid speech pattern.  She is coloring to reduce stress.  Examined patient lying, sitting, and standing in order to place the marking in the patient's visual field, away from any creases or abdominal contour issues and within the rectus muscle.  Note:  Patient is not able to visualize the marking in the LLQ.   Marked for colostomy in the LUQ  6.5 cm to the left of the umbilicus and 5.0TU above the umbilicus.  Marked for colostomy in the LLQ  6.5cm to the left of the umbilicus and 8.8KC below the umbilicus.  Marked for ileostomy in the RLQ  8cm to the right of the umbilicus and  0.0LK above the umbilicus.   Patient's abdomen cleansed x2 with CHG wipes at site markings, allowed to air dry prior to marking.Covered marks with thin film transparent dressing to preserve mark until date of surgery (tomorrow or Friday).   [facility] nursing team will follow along with you in the event an ostomy is created intraoperatively, and will remain available to this patient, the nursing, surgical and medical teams.   Thanks, Maudie Flakes, MSN, RN, [place], [facility]  Pager# [phone]

## 2017-06-10 NOTE — Progress Notes (Signed)
[facility]  PROGRESS NOTE  Ann [name] HCW:[id number] DOB: Nov 05, 1955 DOA: 06/02/2017 PCP: Berkley Harvey, NP   Brief HPI:    61 y.o.femalewith medical history significant ofrecurrent diverticulitis, hypertension, hyperlipidemia, stroke, TIA, GERD, anxiety, OSA on CPAP, obesity, CHF with EF of 45-50 percent, chronic respiratory failure on 4 L oxygen at home, who presents with nausea, vomiting, diarrhea and lower abdominal pain. Patient was just discharged from the hospital on 05/28/2017.  She was supposed to follow-up surgery for possible hemicolectomy as outpatient.  Patient came back with abdominal pain was found to have sigmoid diverticulitis.  Has had multiple rounds of antibiotics and has been seen by GI and surgery in the hospital.  At this time as patient is not improved with IV antibiotics plan is for hemicolectomy in a.m.   Subjective   Patient seen and examined, complains of worsening abdominal pain.  Complains of passing mucus and pus per rectum this morning.  Also had nausea but no vomiting.   Assessment/Plan:     1. Recurrent diverticulitis/colitis-diet again changed to liquid per GI.CT scan shows sigmoid colon with colitis and possible colitis and splenic flexure and descending colon.  No perforation or abscess.  GI was consulted.  Plan is for treating for infectious etiology.  Stool for C. difficile is currently pending.  Antibiotics changed from Cipro/Flagyl  to [facility] general surgery was consulted.  No plan for immediate surgery at this time.  GI and surgery are following.  CT scan shows persistent sigmoid diverticulitis despite being on antibiotics.  Plan is for hemicolectomy as per general surgery. 2. Question right hydronephrosis-CT scan shows distended right renal pelvis and inflammation around distal right ureter.  I have called and discussed with urology who will see the patient today. 3. Hypertension- blood pressure controlled, continue amlodipine,  metoprolol. 4. Chronic respiratory failure with hypoxia- Patient on 4 L of nasal cannula with oxygen at home continue as needed albuterol. 5. Dysuria-patient complained of dysuria.  Urine culture showed insignificant growth. 6. Hypertension-continue Lipitor 7. History of stroke-continue aspirin, Lipitor. 8. Anxiety-continue as needed Klonopin 9. Chronic combined systolic and diastolic CHF-well compensated.  Lasix on hold.    DVT prophylaxis: Lovenox  Code Status: Full code  Family Communication: No family bedside  Disposition Plan: Likely home in 2-3 days.   Consultants:  Gastroenterology  Procedures:  None  Continuous infusions . sodium chloride 50 mL/hr at 06/10/17 0722  . [START ON 06/11/2017] cefoTEtan (CEFOTAN) IV    . ertapenem Stopped (06/10/17 1048)      Antibiotics:   Anti-infectives (From admission, onward)   Start     Dose/Rate Route Frequency Ordered Stop   06/11/17 0600  cefoTEtan (CEFOTAN) 2 g in dextrose 5 % 50 mL IVPB     2 g 100 mL/hr over 30 Minutes Intravenous On call to O.R. 06/10/17 1255 06/12/17 0559   06/11/17 0600  clindamycin (CLEOCIN) 900 mg, gentamicin (GARAMYCIN) 240 mg in sodium chloride 0.9 % 1,000 mL for intraperitoneal lavage      Intraperitoneal To Surgery 06/10/17 1255 06/12/17 0600   06/10/17 1600  neomycin (MYCIFRADIN) tablet 1,000 mg     1,000 mg Oral 3 times daily 06/10/17 1255 06/11/17 1559   06/08/17 1000  ertapenem (INVANZ) 1 g in sodium chloride 0.9 % 50 mL IVPB     1 g 100 mL/hr over 30 Minutes Intravenous Every 24 hours 06/08/17 0930     06/03/17 1200  ciprofloxacin (CIPRO) IVPB 400 mg  Status:  Discontinued  400 mg 200 mL/hr over 60 Minutes Intravenous Every 12 hours 06/03/17 0207 06/08/17 0930   06/03/17 0445  metroNIDAZOLE (FLAGYL) IVPB 500 mg  Status:  Discontinued     500 mg 100 mL/hr over 60 Minutes Intravenous Every 8 hours 06/03/17 0200 06/08/17 0930   06/03/17 0100  ciprofloxacin (CIPRO) IVPB 400 mg     400  mg 200 mL/hr over 60 Minutes Intravenous  Once 06/03/17 0055 06/03/17 0333   06/03/17 0100  metroNIDAZOLE (FLAGYL) IVPB 500 mg  Status:  Discontinued     500 mg 100 mL/hr over 60 Minutes Intravenous  Once 06/03/17 0055 06/03/17 0200       Objective   Vitals:   06/09/17 1111 06/09/17 2048 06/10/17 0420 06/10/17 1519  BP: 109/70 128/73 113/70 109/76  Pulse: 64 69 (!) 57 72  Resp:  18 18 18   Temp: 98.1 F (36.7 C) 98.5 F (36.9 C) 98.1 F (36.7 C) 98.5 F (36.9 C)  TempSrc: Oral Oral Oral Oral  SpO2: 95% 95% 96% 93%  Weight:   101.3 kg (223 lb 5.2 oz)   Height:        Intake/Output Summary (Last 24 hours) at 06/10/2017 1535 Last data filed at 06/10/2017 1520 Gross per 24 hour  Intake 1792.83 ml  Output 1750 ml  Net 42.83 ml   Filed Weights   06/08/17 0609 06/09/17 0500 06/10/17 0420  Weight: 102 kg (224 lb 13.9 oz) 101.9 kg (224 lb 10.4 oz) 101.3 kg (223 lb 5.2 oz)     Physical Examination:  Physical Exam: Eyes: No icterus, extraocular muscles intact  Mouth: Oral mucosa is moist, no lesions on palate,  Neck: Supple, no deformities, masses, or tenderness Lungs: Normal respiratory effort, bilateral clear to auscultation, no crackles or wheezes.  Heart: Regular rate and rhythm, S1 and S2 normal, no murmurs, rubs auscultated Abdomen: BS normoactive,soft,nondistended, left lower quadrant tenderness to palpation Extremities: No pretibial edema, no erythema, no cyanosis, no clubbing Neuro : Alert and oriented to time, place and person, No focal deficits Skin: No rashes seen on exam    Data Reviewed: I have personally reviewed following labs and imaging studies  CBG: No results for input(s): GLUCAP in the last 168 hours.  CBC: Recent Labs  Lab 06/04/17 0531 06/08/17 0424  WBC 6.4 7.2  HGB 11.9* 11.8*  HCT 37.0 36.9  MCV 102.2* 100.8*  PLT 441* 767    Basic Metabolic Panel: Recent Labs  Lab 06/04/17 0531 06/05/17 0450 06/08/17 0424  NA 141 142 140  K  4.2 4.0 3.8  CL 111 109 109  CO2 25 29 26   GLUCOSE 103* 99 103*  BUN 7 5* 9  CREATININE 1.00 1.03* 0.89  CALCIUM 9.0 8.7* 8.5*    Recent Results (from the past 240 hour(s))  Culture, blood (Routine X 2) w Reflex to ID Panel     Status: None   Collection Time: 06/03/17  2:03 AM  Result Value Ref Range Status   Specimen Description BLOOD RIGHT ANTECUBITAL  Final   Special Requests   Final    BOTTLES DRAWN AEROBIC AND ANAEROBIC Blood Culture adequate volume   Culture   Final    NO GROWTH 5 DAYS Performed at [facility], [address], [place], [place] [address]    Report Status 06/08/2017 FINAL  Final  Culture, blood (Routine X 2) w Reflex to ID Panel     Status: None   Collection Time: 06/03/17  2:08 AM  Result Value Ref Range Status   Specimen Description BLOOD LEFT WRIST  Final   Special Requests IN PEDIATRIC BOTTLE Blood Culture adequate volume  Final   Culture   Final    NO GROWTH 5 DAYS Performed at [facility], [address], [place], [place] [address]    Report Status 06/08/2017 FINAL  Final  Urine Culture     Status: Abnormal   Collection Time: 06/03/17  6:50 AM  Result Value Ref Range Status   Specimen Description URINE, CLEAN CATCH  Final   Special Requests NONE  Final   Culture (A)  Final    <10,000 COLONIES/mL INSIGNIFICANT GROWTH Performed at [facility], [place] [address] [place], [place] [address]    Report Status 06/04/2017 FINAL  Final     Liver Function Tests: No results for input(s): AST, ALT, ALKPHOS, BILITOT, PROT, ALBUMIN in the last 168 hours. No results for input(s): LIPASE, AMYLASE in the last 168 hours. No results for input(s): AMMONIA in the last 168 hours.  Cardiac Enzymes: No results for input(s): CKTOTAL, CKMB, CKMBINDEX, TROPONINI in the last 168 hours. BNP (last 3 results) Recent Labs    01/11/17 1106 06/03/17 0550  BNP 10.4 17.5    ProBNP (last 3 results) No results for input(s): PROBNP in the last 8760  hours.    Studies: Ct Abdomen Pelvis W Contrast  Result Date: 06/09/2017 CLINICAL DATA:  Nausea, vomiting, and diarrhea. Lower abdominal pain. History of diverticulitis. EXAM: CT ABDOMEN AND PELVIS WITH CONTRAST TECHNIQUE: Multidetector CT imaging of the abdomen and pelvis was performed using the standard protocol following bolus administration of intravenous contrast. CONTRAST:  116mL ISOVUE-300 IOPAMIDOL (ISOVUE-300) INJECTION 61% COMPARISON:  CT abdomen pelvis dated June 03, 2017. FINDINGS: Lower chest: No acute abnormality. Hepatobiliary: No focal liver abnormality is seen. Status post cholecystectomy. No biliary dilatation. Pancreas: Unremarkable. No pancreatic ductal dilatation or surrounding inflammatory changes. Spleen: Normal in size without focal abnormality. Adrenals/Urinary Tract: The adrenal glands are unremarkable. Slightly increased prominence of the right renal collecting system when compared to prior study. Unchanged mild atrophy of the right kidney with stable subcentimeter low-density lesion, too small to characterize. Left kidney is unremarkable. The bladder is unremarkable. Stomach/Bowel: Persistent wall thickening and inflammatory stranding surrounding the sigmoid colon, similar to prior study. Previously described wall thickening at the splenic flexure and descending colon is no longer identified, and may have been related to underdistention. The stomach and small bowel are unremarkable. Normal appendix. Vascular/Lymphatic: Aortic atherosclerosis. No enlarged abdominal or pelvic lymph nodes. Reproductive: Uterus and bilateral adnexa are unremarkable. Other: No free fluid or pneumoperitoneum. No drainable fluid collection. Musculoskeletal: No acute or significant osseous findings. Degenerative changes of the lumbar spine. IMPRESSION: 1. Unchanged acute sigmoid diverticulitis. No perforation or drainable fluid collection. 2. Slightly increased prominence of the right renal collecting  system when compared to prior study, without evidence of obstructing lesion. Findings could be related to pelvic inflammation surrounding the distal right ureter. 3.  Aortic atherosclerosis (ICD10-I70.0). Electronically Signed   By: Titus Dubin M.D.   On: 06/09/2017 19:48    Scheduled Meds: . [START ON 06/11/2017] acetaminophen  1,000 mg Oral On Call to OR  . [START ON 06/11/2017] alvimopan  12 mg Oral On Call to OR  . amLODipine  10 mg Oral Daily  . aspirin EC  81 mg Oral Daily  . atorvastatin  10 mg Oral Daily  . [START ON 06/11/2017] celecoxib  200 mg Oral On  Call to OR  . chlorhexidine  60 mL Topical Once   And  . [START ON 06/11/2017] chlorhexidine  60 mL Topical Once  . chlorhexidine  15 mL Mouth Rinse BID  . [START ON 06/11/2017] clindamycin / gentamicin INTRAPERITONEAL Lavage irrigation   Intraperitoneal To OR  . dicyclomine  20 mg Oral TID AC & HS  . enoxaparin (LOVENOX) injection  40 mg Subcutaneous Q24H  . feeding supplement (ENSURE ENLIVE)  237 mL Oral BID BM  . fluticasone  2 spray Each Nare Daily  . [START ON 06/11/2017] gabapentin  300 mg Oral On Call to OR  . mouth rinse  15 mL Mouth Rinse q12n4p  . metoprolol succinate  50 mg Oral QHS  . mometasone-formoterol  2 puff Inhalation BID  . neomycin  1,000 mg Oral TID  . pantoprazole  40 mg Oral Daily  . polyethylene glycol powder  1 Container Oral Once  . saccharomyces boulardii  250 mg Oral BID  . simethicone  80 mg Oral QID  . topiramate  50 mg Oral BID      Time spent: 25 min  [facility] Pager [phone]. If 7PM-7AM, please contact night-coverage at [email or web address], Office  [phone]  password [email or web address]  06/10/2017, 3:35 PM  LOS: 7 days

## 2017-06-10 NOTE — Progress Notes (Signed)
[facility] Progress Note    Since last GI note: She is still having intermittent severe abd pains.  CT yesterday, see full report below.  Objective: Vital signs in last 24 hours: Temp:  [98.1 F (36.7 C)-98.5 F (36.9 C)] 98.1 F (36.7 C) (12/05 0420) Pulse Rate:  [57-69] 57 (12/05 0420) Resp:  [18] 18 (12/05 0420) BP: (109-128)/(70-73) 113/70 (12/05 0420) SpO2:  [95 %-96 %] 96 % (12/05 0420) Weight:  [223 lb 5.2 oz (101.3 kg)] 223 lb 5.2 oz (101.3 kg) (12/05 0420) Last BM Date: 06/09/17 General: alert and oriented times 3 Heart: regular rate and rythm Abdomen: soft, mildly tender throughout, non-distended, normal bowel sounds   Lab Results: Recent Labs    06/08/17 0424  WBC 7.2  HGB 11.8*  PLT 335  MCV 100.8*   Recent Labs    06/08/17 0424  NA 140  K 3.8  CL 109  CO2 26  GLUCOSE 103*  BUN 9  CREATININE 0.89  CALCIUM 8.5*   No results for input(s): PROT, ALBUMIN, AST, ALT, ALKPHOS, BILITOT, BILIDIR, IBILI in the last 72 hours. No results for input(s): INR in the last 72 hours.   Studies/Results: Ct Abdomen Pelvis W Contrast  Result Date: 06/09/2017 CLINICAL DATA:  Nausea, vomiting, and diarrhea. Lower abdominal pain. History of diverticulitis. EXAM: CT ABDOMEN AND PELVIS WITH CONTRAST TECHNIQUE: Multidetector CT imaging of the abdomen and pelvis was performed using the standard protocol following bolus administration of intravenous contrast. CONTRAST:  125mL ISOVUE-300 IOPAMIDOL (ISOVUE-300) INJECTION 61% COMPARISON:  CT abdomen pelvis dated June 03, 2017. FINDINGS: Lower chest: No acute abnormality. Hepatobiliary: No focal liver abnormality is seen. Status post cholecystectomy. No biliary dilatation. Pancreas: Unremarkable. No pancreatic ductal dilatation or surrounding inflammatory changes. Spleen: Normal in size without focal abnormality. Adrenals/Urinary Tract: The adrenal glands are unremarkable. Slightly increased prominence of the right  renal collecting system when compared to prior study. Unchanged mild atrophy of the right kidney with stable subcentimeter low-density lesion, too small to characterize. Left kidney is unremarkable. The bladder is unremarkable. Stomach/Bowel: Persistent wall thickening and inflammatory stranding surrounding the sigmoid colon, similar to prior study. Previously described wall thickening at the splenic flexure and descending colon is no longer identified, and may have been related to underdistention. The stomach and small bowel are unremarkable. Normal appendix. Vascular/Lymphatic: Aortic atherosclerosis. No enlarged abdominal or pelvic lymph nodes. Reproductive: Uterus and bilateral adnexa are unremarkable. Other: No free fluid or pneumoperitoneum. No drainable fluid collection. Musculoskeletal: No acute or significant osseous findings. Degenerative changes of the lumbar spine. IMPRESSION: 1. Unchanged acute sigmoid diverticulitis. No perforation or drainable fluid collection. 2. Slightly increased prominence of the right renal collecting system when compared to prior study, without evidence of obstructing lesion. Findings could be related to pelvic inflammation surrounding the distal right ureter. 3.  Aortic atherosclerosis (ICD10-I70.0). Electronically Signed   By: Titus Dubin M.D.   On: 06/09/2017 19:48     Medications: Scheduled Meds: . amLODipine  10 mg Oral Daily  . aspirin EC  81 mg Oral Daily  . atorvastatin  10 mg Oral Daily  . chlorhexidine  15 mL Mouth Rinse BID  . dicyclomine  20 mg Oral TID AC & HS  . enoxaparin (LOVENOX) injection  40 mg Subcutaneous Q24H  . feeding supplement (ENSURE ENLIVE)  237 mL Oral BID BM  . fluticasone  2 spray Each Nare Daily  . mouth rinse  15 mL Mouth Rinse q12n4p  . metoprolol succinate  50  mg Oral QHS  . mometasone-formoterol  2 puff Inhalation BID  . pantoprazole  40 mg Oral Daily  . saccharomyces boulardii  250 mg Oral BID  . simethicone  80 mg Oral  QID  . topiramate  50 mg Oral BID   Continuous Infusions: . sodium chloride 50 mL/hr at 06/09/17 0923  . ertapenem Stopped (06/09/17 1156)   PRN Meds:.acetaminophen, albuterol, clonazePAM, diphenhydrAMINE, hydrALAZINE, HYDROmorphone (DILAUDID) injection, hyoscyamine, iopamidol, oxyCODONE, promethazine, sodium chloride flush, zolpidem    Assessment/Plan: 61 y.o. female sigmoid diverticulitis  The CT yesterday was helpful; shows that this is indeed diverticulitis and not colitis/IBD. The "previously described wall thickening at the splenic flexure and descending colon is no longer identified, and may have been related to underdistention."  I think she has acute on chronic, smoldering sigmoid diverticulitis and is not clinically improving despite 7 days of IV antibiotics now.  There is no need for colonoscopy.  General surgery to decide on if/when segmental colectomy.  Will follow along. Continue IV abx for now.   Milus Banister, MD  06/10/2017, 8:32 AM [facility] Pager [phone]

## 2017-06-10 NOTE — Consult Note (Signed)
Urology Consult  Referring physician: Georgiann Mohs Reason for referral: hydronephrosis  Chief Complaint: Hydronephrosis  History of Present Illness: Patient seen recently in Sept by Dr Jeffie Pollock for distal 47m left ureteral stone managed conservatively it appears; was also treated for pos c/s UTI: was called to assess hydro in patient getting left colectomy tomorrow for left diverticulitis; significant co-morbidities; COPD and home O2; diffuse low abdominal pain;   No right sided pain; no UTI issues  CT scan: acute sigmoid diverticulitis; slight prominence right collecting system possibly due to pelvic inflammation and no stone; I AGREE that right renal pelvis and upper ureter bit full and she has passes left ureteral stone;   Serum Cr .89   Modifying factors: There are no other modifying factors  Associated signs and symptoms: There are no other associated signs and symptoms Aggravating and relieving factors: There are no other aggravating or relieving factors Severity: Moderate Duration: Persistent    Past Medical History:  Diagnosis Date  . Adenoma of colon   . Allergy    seasonal  . Anemia    with past pregnancy -not recent  . Anxiety   . Arthritis   . Borderline diabetes   . Chronic cystitis   . Complication of anesthesia    02-20-2014 ([facility]) INTRA-OP RESPIRATORY FAILURE SECONDARY TO POSSIBLE MUCOUS ASPIRATION/  ALSO 06-06-2013 ([facility] POST-OP DESATURATION  EVEN USING CPAP, States some issues if Propofol given to rapidly.  . Diverticulitis   . Diverticulosis of colon   . Dyspnea   . Family history of adverse reaction to anesthesia    PER PT SISTER DIED AFTER GENERAL ANESTHESIA DUE TO UNDIAGNOSED OSA  . GERD (gastroesophageal reflux disease)   . H/O hiatal hernia   . Headache(784.0)    migraines, decreased since turning 50's  . History of chronic cough    "tight sounding cough"  . History of esophageal dilatation   . History of kidney stones   . History of MRSA infection    3/  2015  AXILLARY ABSCESS  . History of recurrent UTIs   . History of TIAs NO RESIDUAL   1980;  2005;   2008 PT STATES PER CT  SCARRING RIGHT SIDE OF BRAIN  . Hyperlipidemia   . Hypertension   . Kidney disease    III  . [facility]romuscular disorder [place]  . Neuropathy    pt denies this 07-05-2015  . Nocturia   . OSA on CPAP    PER SLEEP STUDY 09-08-2012  MODERATE OSA. not used in awhile, but thinks needs to start back using due to some weight gain.  . Sleep apnea   . Stroke ([facility] [phone]  TIA'S  . SUI (stress urinary incontinence, female)    Past Surgical History:  Procedure Laterality Date  . ANTERIOR AND POSTERIOR REPAIR N/A 06/06/2013   Procedure: Anterior vaginal vault repair, Sacrospinous ligament fixation with UPHOLD lite, Kelly plication, Sacrospinous mesh fixation, Solyx transurethral sling;  Surgeon: SAilene Rud MD;  [facility]  Service: Urology;  Laterality: N/A;  . CHOLECYSTECTOMY N/A 02/10/2017   Procedure: LAPAROSCOPIC CHOLECYSTECTOMY;  Surgeon: BCoralie Keens MD;  [facility]  Service: General;  Laterality: N/A;  . CYSTOSCOPY W/ URETERAL STENT PLACEMENT Right 04/21/2014   Procedure: CYSTOSCOPY WITH RETROGRADE PYELOGRAM/URETERAL STENT PLACEMENT;  Surgeon: SAilene Rud MD;  [facility];  Service: Urology;  Laterality: Right;  . CYSTOSCOPY W/ URETERAL STENT PLACEMENT Right 11/21/2014   Procedure: CYSTOSCOPY WITH  RETROGRADE PYELOGRAM, URETEROSCOPY WITH STONE REMOVAL, URETERAL STENT PLACEMENT;  Surgeon: Carolan Clines, MD;  Location: [facility];  Service: Urology;  Laterality: Right;  . CYSTOSCOPY W/ URETERAL STENT PLACEMENT Left 02/01/2016   Procedure: CYSTO, LEFT URETEROSCOPY, LEFT RETROGRADE AND LEFT URETERAL STENT PLACEMENT;  Surgeon: Carolan Clines, MD;  Location: [facility];  Service: Urology;  Laterality: Left;  . CYSTOSCOPY WITH RETROGRADE PYELOGRAM, URETEROSCOPY AND STENT PLACEMENT Right 06/05/2014    Procedure: CYSTOSCOPY WITH RIGHT RETROGRADE PYELOGRAM, RIGHT URETEROSCOPY, STONE EXTRACTION AND RIGHT DOUBLE J STENT PLACEMENT;  Surgeon: Ailene Rud, MD;  Location: [facility];  Service: Urology;  Laterality: Right;  . ESOPHAGEAL DILATION  X2  LAST ONE  JUNE 2014   has had 9 of these  . ESOPHAGEAL MANOMETRY N/A 09/10/2015   Procedure: ESOPHAGEAL MANOMETRY (EM);  Surgeon: Manus Gunning, MD;  [facility];  Service: Gastroenterology;  Laterality: N/A;  . HOLMIUM LASER APPLICATION Right 54/62/7035   Procedure: HOLMIUM LASER OF STONE ;  Surgeon: Ailene Rud, MD;  Location: [facility];  Service: Urology;  Laterality: Right;  . KNEE ARTHROSCOPY Bilateral 2008  . LAPAROSCOPIC CHOLECYSTECTOMY  02/10/2017  . MULTIPLE TOOTH EXTRACTIONS     past hx  . RECTOCELE REPAIR N/A 02/20/2014   Procedure: POSTERIOR REPAIR (RECTOCELE) WITH VAGINAL VAULT REPAIR WITH ACELL GRAFT PLACEMENT, PERINEOPLASTY, ENTEROCELE REPAIR;  Surgeon: Ailene Rud, MD;  Location: [facility];  Service: Urology;  Laterality: N/A;  . TUBAL LIGATION  1987    Medications: I have reviewed the patient's current medications. Allergies:  Allergies  Allergen Reactions  . Lisinopril Cough  . Doxycycline Diarrhea    Severe headaches  . Metronidazole Other (See Comments)    Severe headaches    Family History  Problem Relation Age of Onset  . Colon cancer Father 79       died at 67  . Heart disease Mother        died at 63  . Heart attack Mother   . Heart attack Sister        died at 54  . Hypertension Brother   . Heart attack Cousin 51       with death  . Heart attack Cousin 68       died with MI  . Esophageal cancer Neg Hx   . Rectal cancer Neg Hx   . Stomach cancer Neg Hx   . Colon polyps Neg Hx    Social History:  reports that  has never smoked. she has never used smokeless tobacco. She reports that she does not drink alcohol or use drugs.  ROS: All  systems are reviewed and negative except as noted. Rest negative  Physical Exam:  Vital signs in last 24 hours: Temp:  [98.1 F (36.7 C)-98.5 F (36.9 C)] 98.5 F (36.9 C) (12/05 1519) Pulse Rate:  [57-72] 72 (12/05 1519) Resp:  [18] 18 (12/05 1519) BP: (109-128)/(70-76) 109/76 (12/05 1519) SpO2:  [93 %-96 %] 93 % (12/05 1519) Weight:  [101.3 kg (223 lb 5.2 oz)] 101.3 kg (223 lb 5.2 oz) (12/05 0420)  Cardiovascular: Skin warm; not flushed Respiratory: Breaths quiet; no shortness of breath Abdomen: No masses Neurological: Normal sensation to touch Musculoskeletal: Normal motor function arms and legs Lymphatics: No inguinal adenopathy Skin: No rashes Genitourinary:non toxic  Laboratory Data:  Results for orders placed or performed during the hospital encounter of 06/02/17 (from the past 72 hour(s))  CBC     Status: Abnormal  Collection Time: 06/08/17  4:24 AM  Result Value Ref Range   WBC 7.2 4.0 - 10.5 K/uL   RBC 3.66 (L) 3.87 - 5.11 MIL/uL   Hemoglobin 11.8 (L) 12.0 - 15.0 g/dL   HCT 36.9 36.0 - 46.0 %   MCV 100.8 (H) 78.0 - 100.0 fL   MCH 32.2 26.0 - 34.0 pg   MCHC 32.0 30.0 - 36.0 g/dL   RDW 14.2 11.5 - 15.5 %   Platelets 335 150 - 400 K/uL  Basic metabolic panel     Status: Abnormal   Collection Time: 06/08/17  4:24 AM  Result Value Ref Range   Sodium 140 135 - 145 mmol/L   Potassium 3.8 3.5 - 5.1 mmol/L   Chloride 109 101 - 111 mmol/L   CO2 26 22 - 32 mmol/L   Glucose, Bld 103 (H) 65 - 99 mg/dL   BUN 9 6 - 20 mg/dL   Creatinine, Ser 0.89 0.44 - 1.00 mg/dL   Calcium 8.5 (L) 8.9 - 10.3 mg/dL   GFR calc non Af Amer >60 >60 mL/min   GFR calc Af Amer >60 >60 mL/min    Comment: (NOTE) The eGFR has been calculated using the CKD EPI equation. This calculation has not been validated in all clinical situations. eGFR's persistently <60 mL/min signify possible Chronic Kidney Disease.    Anion gap 5 5 - 15   Recent Results (from the past 240 hour(s))  Culture,  blood (Routine X 2) w Reflex to ID Panel     Status: None   Collection Time: 06/03/17  2:03 AM  Result Value Ref Range Status   Specimen Description BLOOD RIGHT ANTECUBITAL  Final   Special Requests   Final    BOTTLES DRAWN AEROBIC AND ANAEROBIC Blood Culture adequate volume   Culture   Final    NO GROWTH 5 DAYS Performed at [facility], [place] [address] [place], [place] [address]    Report Status 06/08/2017 FINAL  Final  Culture, blood (Routine X 2) w Reflex to ID Panel     Status: None   Collection Time: 06/03/17  2:08 AM  Result Value Ref Range Status   Specimen Description BLOOD LEFT WRIST  Final   Special Requests IN PEDIATRIC BOTTLE Blood Culture adequate volume  Final   Culture   Final    NO GROWTH 5 DAYS Performed at [facility], [place] [address], [place], [place] [address]    Report Status 06/08/2017 FINAL  Final  Urine Culture     Status: Abnormal   Collection Time: 06/03/17  6:50 AM  Result Value Ref Range Status   Specimen Description URINE, CLEAN CATCH  Final   Special Requests NONE  Final   Culture (A)  Final    <10,000 COLONIES/mL INSIGNIFICANT GROWTH Performed at [facility], [place] [address], [place], [place] [address]    Report Status 06/04/2017 FINAL  Final   Creatinine: Recent Labs    06/04/17 0531 06/05/17 0450 06/08/17 0424  CREATININE 1.00 1.03* 0.89    Xrays: See report/chart As noted   Impression/Assessment:  Mild fullness right collecting system likely from pelvic inflammation  Plan:  No need for stent tomorrow F/up with Dr Jeffie Pollock electively   Mirinda Monte A 06/10/2017, 3:39 PM

## 2017-06-10 NOTE — Progress Notes (Signed)
Patient ID: Ann [name], female   DOB: [birth year], 60 y.o.   MRN: [id number]  [facility] Surgery Progress Note     Subjective: CC- abdominal pain Unchanged from yesterday. Patient reports persistent lower abdominal pain. It is constant, worse during BM. She reports multiple bloody, mucousy BMs yesterday. Nausea, no emesis. Tolerating small amounts of clear liquids.  CT scan from yesterday showed unchanged sigmoid diverticulitis, colitis on previous CT not seen.  Objective: Vital signs in last 24 hours: Temp:  [98.1 F (36.7 C)-98.5 F (36.9 C)] 98.1 F (36.7 C) (12/05 0420) Pulse Rate:  [57-69] 57 (12/05 0420) Resp:  [18] 18 (12/05 0420) BP: (109-128)/(70-73) 113/70 (12/05 0420) SpO2:  [95 %-96 %] 96 % (12/05 0420) Weight:  [223 lb 5.2 oz (101.3 kg)] 223 lb 5.2 oz (101.3 kg) (12/05 0420) Last BM Date: 06/09/17  Intake/Output from previous day: 12/04 0701 - 12/05 0700 In: 1690.8 [P.O.:600; I.V.:1040.8; IV Piggyback:50] Out: 5809 [Urine:1750] Intake/Output this shift: No intake/output data recorded.  PE: Gen: Alert, NAD HEENT: EOM's intact, pupils equal and round Card: RRR, no M/G/R heard Pulm: CTAB, no W/R/R, effort normal Abd: Soft,ND, nontender/subjective TTP lower abdomen, +BS Ext: No erythema, edema, or tenderness BUE/BLE  Psych: A&Ox3  Skin: no rashes noted, warm and dry  Lab Results:  Recent Labs    06/08/17 0424  WBC 7.2  HGB 11.8*  HCT 36.9  PLT 335   BMET Recent Labs    06/08/17 0424  NA 140  K 3.8  CL 109  CO2 26  GLUCOSE 103*  BUN 9  CREATININE 0.89  CALCIUM 8.5*   PT/INR No results for input(s): LABPROT, INR in the last 72 hours. CMP     Component Value Date/Time   NA 140 06/08/2017 0424   K 3.8 06/08/2017 0424   CL 109 06/08/2017 0424   CO2 26 06/08/2017 0424   GLUCOSE 103 (H) 06/08/2017 0424   BUN 9 06/08/2017 0424   CREATININE 0.89 06/08/2017 0424   CALCIUM 8.5 (L) 06/08/2017 0424   PROT 7.5 06/02/2017 2300    ALBUMIN 3.5 06/02/2017 2300   AST 17 06/02/2017 2300   ALT 16 06/02/2017 2300   ALKPHOS 62 06/02/2017 2300   BILITOT 0.4 06/02/2017 2300   GFRNONAA >60 06/08/2017 0424   GFRAA >60 06/08/2017 0424   Lipase     Component Value Date/Time   LIPASE 23 06/02/2017 2300       Studies/Results: Ct Abdomen Pelvis W Contrast  Result Date: 06/09/2017 CLINICAL DATA:  Nausea, vomiting, and diarrhea. Lower abdominal pain. History of diverticulitis. EXAM: CT ABDOMEN AND PELVIS WITH CONTRAST TECHNIQUE: Multidetector CT imaging of the abdomen and pelvis was performed using the standard protocol following bolus administration of intravenous contrast. CONTRAST:  167mL ISOVUE-300 IOPAMIDOL (ISOVUE-300) INJECTION 61% COMPARISON:  CT abdomen pelvis dated June 03, 2017. FINDINGS: Lower chest: No acute abnormality. Hepatobiliary: No focal liver abnormality is seen. Status post cholecystectomy. No biliary dilatation. Pancreas: Unremarkable. No pancreatic ductal dilatation or surrounding inflammatory changes. Spleen: Normal in size without focal abnormality. Adrenals/Urinary Tract: The adrenal glands are unremarkable. Slightly increased prominence of the right renal collecting system when compared to prior study. Unchanged mild atrophy of the right kidney with stable subcentimeter low-density lesion, too small to characterize. Left kidney is unremarkable. The bladder is unremarkable. Stomach/Bowel: Persistent wall thickening and inflammatory stranding surrounding the sigmoid colon, similar to prior study. Previously described wall thickening at the splenic flexure and descending colon is no longer identified, and  may have been related to underdistention. The stomach and small bowel are unremarkable. Normal appendix. Vascular/Lymphatic: Aortic atherosclerosis. No enlarged abdominal or pelvic lymph nodes. Reproductive: Uterus and bilateral adnexa are unremarkable. Other: No free fluid or pneumoperitoneum. No drainable  fluid collection. Musculoskeletal: No acute or significant osseous findings. Degenerative changes of the lumbar spine. IMPRESSION: 1. Unchanged acute sigmoid diverticulitis. No perforation or drainable fluid collection. 2. Slightly increased prominence of the right renal collecting system when compared to prior study, without evidence of obstructing lesion. Findings could be related to pelvic inflammation surrounding the distal right ureter. 3.  Aortic atherosclerosis (ICD10-I70.0). Electronically Signed   By: Titus Dubin M.D.   On: 06/09/2017 19:48    Anti-infectives: Anti-infectives (From admission, onward)   Start     Dose/Rate Route Frequency Ordered Stop   06/08/17 1000  ertapenem (INVANZ) 1 g in sodium chloride 0.9 % 50 mL IVPB     1 g 100 mL/hr over 30 Minutes Intravenous Every 24 hours 06/08/17 0930     06/03/17 1200  ciprofloxacin (CIPRO) IVPB 400 mg  Status:  Discontinued     400 mg 200 mL/hr over 60 Minutes Intravenous Every 12 hours 06/03/17 0207 06/08/17 0930   06/03/17 0445  metroNIDAZOLE (FLAGYL) IVPB 500 mg  Status:  Discontinued     500 mg 100 mL/hr over 60 Minutes Intravenous Every 8 hours 06/03/17 0200 06/08/17 0930   06/03/17 0100  ciprofloxacin (CIPRO) IVPB 400 mg     400 mg 200 mL/hr over 60 Minutes Intravenous  Once 06/03/17 0055 06/03/17 0333   06/03/17 0100  metroNIDAZOLE (FLAGYL) IVPB 500 mg  Status:  Discontinued     500 mg 100 mL/hr over 60 Minutes Intravenous  Once 06/03/17 0055 06/03/17 0200       Assessment/Plan HTN H/o CVA GERD OSA Chronic repsiratory failure Morbid obesity Anxiety Combined systolic and diastolic CHF  Diverticulitis vs colitis - last colonoscopy 08/26/2016 Dr. Havery Moros showed diverticulosis in the transverse colon and in the left colon -CT 11/28 showed focal wall thickening with moderate surrounding inflammation at the sigmoid colon, diverticulitisvs colitis at thesplenic flexure and descending colon possibly 2/2infectious  or inflammatory bowel disease - CT repeated 12/4 showing unchanged sigmoid diverticulitis without perforation, previously described wall thickening at the splenic flexure and descending colon is no longer identified - afebrile  ID -cipro/flagyl 11/28>>12/3, invanz 12/3>>day#3 FEN -IVF, CLD, Ensure VTE -SCDs, lovenox  Plan-CT scan shows unchanged sigmoid diverticulitis, no longer showing inflamation in the descending colon. VSS and patient nontender on exam, but she reports no improvement in her symptoms. Continue Invanz for now.  Due to lack of subjective improvement may need to consider surgery, will discuss timing with MD.   LOS: 7 days    Wellington Hampshire , [facility] Surgery 06/10/2017, 9:26 AM Pager: [phone] Consults: [phone] Mon-Fri 7:00 am-4:30 pm Sat-Sun 7:00 am-11:30 am

## 2017-06-11 ENCOUNTER — Inpatient Hospital Stay (HOSPITAL_COMMUNITY): Payer: Medicaid Other | Admitting: Certified Registered Nurse Anesthetist

## 2017-06-11 ENCOUNTER — Encounter (HOSPITAL_COMMUNITY)
Admission: EM | Disposition: A | Discharge status: Home / Self Care | Payer: Self-pay | Source: Home / Self Care | Attending: Family Medicine

## 2017-06-11 ENCOUNTER — Encounter (HOSPITAL_COMMUNITY): Payer: Self-pay | Admitting: Certified Registered Nurse Anesthetist

## 2017-06-11 HISTORY — PX: COLECTOMY WITH COLOSTOMY CREATION/HARTMANN PROCEDURE: SHX6598

## 2017-06-11 LAB — MRSA PCR SCREENING: MRSA by PCR: NEGATIVE

## 2017-06-11 SURGERY — COLECTOMY, WITH COLOSTOMY CREATION
Anesthesia: General | Site: Abdomen

## 2017-06-11 MED ORDER — CELECOXIB 200 MG PO CAPS
ORAL_CAPSULE | ORAL | Status: AC
  Filled 2017-06-11: qty 1

## 2017-06-11 MED ORDER — GABAPENTIN 300 MG PO CAPS
ORAL_CAPSULE | ORAL | Status: AC
  Filled 2017-06-11: qty 1

## 2017-06-11 MED ORDER — ONDANSETRON HCL 4 MG/2ML IJ SOLN
4.0000 mg | Freq: Four times a day (QID) | INTRAMUSCULAR | Status: DC | PRN
  Administered 2017-06-12 – 2017-06-13 (×4): 4 mg via INTRAVENOUS
  Filled 2017-06-11 (×4): qty 2

## 2017-06-11 MED ORDER — SODIUM CHLORIDE 0.9 % IV SOLN
INTRAVENOUS | Status: DC | PRN
  Administered 2017-06-11: 500 mL

## 2017-06-11 MED ORDER — ROCURONIUM BROMIDE 10 MG/ML (PF) SYRINGE
PREFILLED_SYRINGE | INTRAVENOUS | Status: DC | PRN
  Administered 2017-06-11: 20 mg via INTRAVENOUS
  Administered 2017-06-11: 50 mg via INTRAVENOUS

## 2017-06-11 MED ORDER — LIDOCAINE HCL (PF) 1 % IJ SOLN
INTRAMUSCULAR | Status: AC
  Filled 2017-06-11: qty 30

## 2017-06-11 MED ORDER — LACTATED RINGERS IV SOLN
INTRAVENOUS | Status: DC | PRN
Start: 2017-06-11 — End: 2017-06-11
  Administered 2017-06-11 (×2): via INTRAVENOUS

## 2017-06-11 MED ORDER — PROPOFOL 10 MG/ML IV BOLUS
INTRAVENOUS | Status: DC | PRN
  Administered 2017-06-11: 120 mg via INTRAVENOUS

## 2017-06-11 MED ORDER — CEFAZOLIN SODIUM-DEXTROSE 2-4 GM/100ML-% IV SOLN
INTRAVENOUS | Status: AC
  Filled 2017-06-11: qty 100

## 2017-06-11 MED ORDER — KETAMINE HCL 10 MG/ML IJ SOLN
INTRAMUSCULAR | Status: AC
  Filled 2017-06-11: qty 1

## 2017-06-11 MED ORDER — BUPIVACAINE-EPINEPHRINE (PF) 0.25% -1:200000 IJ SOLN
INTRAMUSCULAR | Status: AC
  Filled 2017-06-11: qty 30

## 2017-06-11 MED ORDER — CEFOTETAN DISODIUM-DEXTROSE 2-2.08 GM-%(50ML) IV SOLR
INTRAVENOUS | Status: AC
  Filled 2017-06-11: qty 50

## 2017-06-11 MED ORDER — FENTANYL CITRATE (PF) 250 MCG/5ML IJ SOLN
INTRAMUSCULAR | Status: AC
  Filled 2017-06-11: qty 5

## 2017-06-11 MED ORDER — ALVIMOPAN 12 MG PO CAPS
ORAL_CAPSULE | ORAL | Status: AC
  Filled 2017-06-11: qty 1

## 2017-06-11 MED ORDER — KETAMINE HCL 10 MG/ML IJ SOLN
INTRAMUSCULAR | Status: DC | PRN
  Administered 2017-06-11: 30 mg via INTRAVENOUS

## 2017-06-11 MED ORDER — ONDANSETRON HCL 4 MG/2ML IJ SOLN
INTRAMUSCULAR | Status: AC
  Filled 2017-06-11: qty 2

## 2017-06-11 MED ORDER — ALVIMOPAN 12 MG PO CAPS
12.0000 mg | ORAL_CAPSULE | Freq: Two times a day (BID) | ORAL | Status: DC
  Administered 2017-06-12 – 2017-06-15 (×7): 12 mg via ORAL
  Filled 2017-06-11 (×7): qty 1

## 2017-06-11 MED ORDER — SODIUM CHLORIDE 0.9% FLUSH: 9.0000 mL | INTRAVENOUS | Status: DC | PRN

## 2017-06-11 MED ORDER — SODIUM CHLORIDE 0.9 % IV SOLN
INTRAVENOUS | Status: AC
  Filled 2017-06-11: qty 500000

## 2017-06-11 MED ORDER — ONDANSETRON HCL 4 MG/2ML IJ SOLN
INTRAMUSCULAR | Status: DC | PRN
  Administered 2017-06-11: 4 mg via INTRAVENOUS

## 2017-06-11 MED ORDER — ROCURONIUM BROMIDE 50 MG/5ML IV SOSY
PREFILLED_SYRINGE | INTRAVENOUS | Status: AC
  Filled 2017-06-11: qty 5

## 2017-06-11 MED ORDER — LABETALOL HCL 5 MG/ML IV SOLN
INTRAVENOUS | Status: AC
  Filled 2017-06-11: qty 4

## 2017-06-11 MED ORDER — DEXAMETHASONE SODIUM PHOSPHATE 10 MG/ML IJ SOLN
INTRAMUSCULAR | Status: AC
  Filled 2017-06-11: qty 1

## 2017-06-11 MED ORDER — MIDAZOLAM HCL 5 MG/5ML IJ SOLN
INTRAMUSCULAR | Status: DC | PRN
  Administered 2017-06-11: 2 mg via INTRAVENOUS

## 2017-06-11 MED ORDER — LIDOCAINE 2% (20 MG/ML) 5 ML SYRINGE
INTRAMUSCULAR | Status: DC | PRN
  Administered 2017-06-11: 1.5 mg/kg/h via INTRAVENOUS

## 2017-06-11 MED ORDER — SUGAMMADEX SODIUM 500 MG/5ML IV SOLN
INTRAVENOUS | Status: DC | PRN
  Administered 2017-06-11: 300 mg via INTRAVENOUS

## 2017-06-11 MED ORDER — SODIUM CHLORIDE 0.9 % IV SOLN
INTRAVENOUS | Status: DC
  Administered 2017-06-11 – 2017-06-17 (×6): via INTRAVENOUS
  Filled 2017-06-11 (×14): qty 1000

## 2017-06-11 MED ORDER — PROPOFOL 10 MG/ML IV BOLUS
INTRAVENOUS | Status: AC
  Filled 2017-06-11: qty 20

## 2017-06-11 MED ORDER — SUCCINYLCHOLINE CHLORIDE 200 MG/10ML IV SOSY
PREFILLED_SYRINGE | INTRAVENOUS | Status: AC
  Filled 2017-06-11: qty 10

## 2017-06-11 MED ORDER — LIDOCAINE 2% (20 MG/ML) 5 ML SYRINGE
INTRAMUSCULAR | Status: AC
  Filled 2017-06-11: qty 5

## 2017-06-11 MED ORDER — MIDAZOLAM HCL 2 MG/2ML IJ SOLN
INTRAMUSCULAR | Status: AC
  Filled 2017-06-11: qty 2

## 2017-06-11 MED ORDER — ACETAMINOPHEN 500 MG PO TABS
ORAL_TABLET | ORAL | Status: AC
  Filled 2017-06-11: qty 2

## 2017-06-11 MED ORDER — ENOXAPARIN SODIUM 40 MG/0.4ML ~~LOC~~ SOLN
40.0000 mg | SUBCUTANEOUS | Status: DC
  Administered 2017-06-11 – 2017-06-16 (×6): 40 mg via SUBCUTANEOUS
  Filled 2017-06-11 (×7): qty 0.4

## 2017-06-11 MED ORDER — SUGAMMADEX SODIUM 500 MG/5ML IV SOLN
INTRAVENOUS | Status: AC
  Filled 2017-06-11: qty 5

## 2017-06-11 MED ORDER — FENTANYL CITRATE (PF) 100 MCG/2ML IJ SOLN
INTRAMUSCULAR | Status: DC | PRN
  Administered 2017-06-11: 50 ug via INTRAVENOUS
  Administered 2017-06-11: 100 ug via INTRAVENOUS
  Administered 2017-06-11 (×2): 50 ug via INTRAVENOUS

## 2017-06-11 MED ORDER — PROMETHAZINE HCL 25 MG/ML IJ SOLN: 6.2500 mg | INTRAMUSCULAR | Status: DC | PRN

## 2017-06-11 MED ORDER — DEXAMETHASONE SODIUM PHOSPHATE 4 MG/ML IJ SOLN
INTRAMUSCULAR | Status: DC | PRN
  Administered 2017-06-11: 10 mg via INTRAVENOUS

## 2017-06-11 MED ORDER — DIPHENHYDRAMINE HCL 12.5 MG/5ML PO ELIX: 12.5000 mg | ORAL_SOLUTION | Freq: Four times a day (QID) | ORAL | Status: DC | PRN

## 2017-06-11 MED ORDER — NALOXONE HCL 0.4 MG/ML IJ SOLN
0.4000 mg | INTRAMUSCULAR | Status: DC | PRN
Start: 2017-06-11 — End: 2017-06-15

## 2017-06-11 MED ORDER — SODIUM CHLORIDE 0.9 % IR SOLN
Status: DC | PRN
  Administered 2017-06-11: 1000 mL
  Administered 2017-06-11: 2000 mL

## 2017-06-11 MED ORDER — LABETALOL HCL 5 MG/ML IV SOLN
INTRAVENOUS | Status: DC | PRN
  Administered 2017-06-11 (×2): 5 mg via INTRAVENOUS

## 2017-06-11 MED ORDER — FENTANYL CITRATE (PF) 100 MCG/2ML IJ SOLN
25.0000 ug | INTRAMUSCULAR | Status: DC | PRN
  Administered 2017-06-11 (×2): 50 ug via INTRAVENOUS

## 2017-06-11 MED ORDER — FENTANYL CITRATE (PF) 100 MCG/2ML IJ SOLN
INTRAMUSCULAR | Status: AC
  Filled 2017-06-11: qty 2

## 2017-06-11 MED ORDER — DIPHENHYDRAMINE HCL 50 MG/ML IJ SOLN: 12.5000 mg | Freq: Four times a day (QID) | INTRAMUSCULAR | Status: DC | PRN

## 2017-06-11 MED ORDER — HYDROMORPHONE 1 MG/ML IV SOLN
INTRAVENOUS | Status: DC
  Administered 2017-06-11: 0.8 mg via INTRAVENOUS
  Administered 2017-06-11: 18:00:00 via INTRAVENOUS
  Administered 2017-06-12: 2.1 mg via INTRAVENOUS
  Administered 2017-06-12: 2.4 mg via INTRAVENOUS
  Administered 2017-06-12 (×2): 2.1 mg via INTRAVENOUS
  Administered 2017-06-12: 1.5 mg via INTRAVENOUS
  Administered 2017-06-12: 0.6 mg via INTRAVENOUS
  Administered 2017-06-13: 0.3 mg via INTRAVENOUS
  Administered 2017-06-13: 1.2 mg via INTRAVENOUS
  Administered 2017-06-13: 0.9 mg via INTRAVENOUS
  Administered 2017-06-13: 3.3 mg via INTRAVENOUS
  Administered 2017-06-13 – 2017-06-14 (×2): 1.2 mg via INTRAVENOUS
  Administered 2017-06-14 (×2): 1.5 mg via INTRAVENOUS
  Administered 2017-06-14: 1.8 mg via INTRAVENOUS
  Administered 2017-06-14: 1.5 mg via INTRAVENOUS
  Administered 2017-06-14: 0.9 mg via INTRAVENOUS
  Administered 2017-06-15: 1.5 mg via INTRAVENOUS
  Administered 2017-06-15 (×2): 2.1 mg via INTRAVENOUS
  Filled 2017-06-11 (×2): qty 25

## 2017-06-11 SURGICAL SUPPLY — 58 items
BLADE EXTENDED COATED 6.5IN (ELECTRODE) ×3 IMPLANT
BLADE HEX COATED 2.75 (ELECTRODE) ×6 IMPLANT
CATH KIT ON-Q SILVERSOAK 7.5IN (CATHETERS) IMPLANT
CELLS DAT CNTRL 66122 CELL SVR (MISCELLANEOUS) IMPLANT
COUNTER NEEDLE 20 DBL MAG RED (NEEDLE) ×3 IMPLANT
COVER SURGICAL LIGHT HANDLE (MISCELLANEOUS) ×3 IMPLANT
DECANTER SPIKE VIAL GLASS SM (MISCELLANEOUS) IMPLANT
DRAIN CHANNEL 19F RND (DRAIN) IMPLANT
DRAPE LAPAROSCOPIC ABDOMINAL (DRAPES) ×3 IMPLANT
DRAPE UTILITY XL STRL (DRAPES) ×6 IMPLANT
DRSG OPSITE POSTOP 4X10 (GAUZE/BANDAGES/DRESSINGS) IMPLANT
DRSG OPSITE POSTOP 4X6 (GAUZE/BANDAGES/DRESSINGS) IMPLANT
DRSG OPSITE POSTOP 4X8 (GAUZE/BANDAGES/DRESSINGS) IMPLANT
DRSG TEGADERM 4X4.75 (GAUZE/BANDAGES/DRESSINGS) IMPLANT
ELECT REM PT RETURN 15FT ADLT (MISCELLANEOUS) ×3 IMPLANT
ENDOLOOP SUT PDS II  0 18 (SUTURE)
ENDOLOOP SUT PDS II 0 18 (SUTURE) IMPLANT
GAUZE SPONGE 4X4 12PLY STRL (GAUZE/BANDAGES/DRESSINGS) ×3 IMPLANT
GLOVE BIO SURGEON STRL SZ 6 (GLOVE) ×6 IMPLANT
GLOVE INDICATOR 6.5 STRL GRN (GLOVE) ×6 IMPLANT
GOWN STRL REUS W/ TWL XL LVL3 (GOWN DISPOSABLE) ×3 IMPLANT
GOWN STRL REUS W/TWL XL LVL3 (GOWN DISPOSABLE) ×6
KIT PREVENA INCISION MGT 13 (CANNISTER) ×3 IMPLANT
LEGGING LITHOTOMY PAIR STRL (DRAPES) ×3 IMPLANT
LUBRICANT JELLY K Y 4OZ (MISCELLANEOUS) ×3 IMPLANT
PACK COLON (CUSTOM PROCEDURE TRAY) ×3 IMPLANT
PACK GENERAL/GYN (CUSTOM PROCEDURE TRAY) ×3 IMPLANT
RTRCTR WOUND ALEXIS 18CM MED (MISCELLANEOUS)
SCISSORS LAP 5X35 DISP (ENDOMECHANICALS) IMPLANT
SLEEVE SURGEON STRL (DRAPES) IMPLANT
SPONGE LAP 18X18 X RAY DECT (DISPOSABLE) ×18 IMPLANT
STAPLER CIRC CVD 29MM 37CM (STAPLE) ×3 IMPLANT
STAPLER CUT CVD 40MM BLUE (STAPLE) ×3 IMPLANT
STAPLER PROXIMATE 75MM BLUE (STAPLE) ×3 IMPLANT
STAPLER VISISTAT 35W (STAPLE) ×3 IMPLANT
SUT MNCRL AB 4-0 PS2 18 (SUTURE) IMPLANT
SUT PDS AB 1 CTX 36 (SUTURE) IMPLANT
SUT PDS AB 1 TP1 96 (SUTURE) ×6 IMPLANT
SUT PROLENE 2 0 KS (SUTURE) ×3 IMPLANT
SUT SILK 2 0 (SUTURE)
SUT SILK 2 0 SH CR/8 (SUTURE) ×3 IMPLANT
SUT SILK 2-0 18XBRD TIE 12 (SUTURE) IMPLANT
SUT SILK 3 0 (SUTURE)
SUT SILK 3 0 SH CR/8 (SUTURE) IMPLANT
SUT SILK 3-0 18XBRD TIE 12 (SUTURE) IMPLANT
SUT VIC AB 2-0 CT1 27 (SUTURE) ×2
SUT VIC AB 2-0 CT1 TAPERPNT 27 (SUTURE) ×1 IMPLANT
SUT VIC AB 2-0 SH 18 (SUTURE) ×3 IMPLANT
SUT VIC AB 3-0 SH 18 (SUTURE) ×3 IMPLANT
SUT VICRYL 2 0 18  UND BR (SUTURE) ×2
SUT VICRYL 2 0 18 UND BR (SUTURE) ×1 IMPLANT
SUT VICRYL 3 0 BR 18  UND (SUTURE) ×2
SUT VICRYL 3 0 BR 18 UND (SUTURE) ×1 IMPLANT
TOWEL OR NON WOVEN STRL DISP B (DISPOSABLE) ×6 IMPLANT
TRAY FOLEY W/METER SILVER 16FR (SET/KITS/TRAYS/PACK) ×3 IMPLANT
TUBING CONNECTING 10 (TUBING) ×2 IMPLANT
TUBING CONNECTING 10' (TUBING) ×1
TUNNELER SHEATH ON-Q 16GX12 DP (PAIN MANAGEMENT) IMPLANT

## 2017-06-11 NOTE — Transfer of Care (Signed)
Immediate Anesthesia Transfer of Care Note  Patient: Ann [name]  Procedure(s) Performed: OPEN SIGMOID COLECTOMY (N/A Abdomen)  Patient Location: PACU  Anesthesia Type:General  Level of Consciousness: awake, drowsy and responds to stimulation  Airway & Oxygen Therapy: Patient Spontanous Breathing and Patient connected to face mask oxygen  Post-op Assessment: Report given to RN and Post -op Vital signs reviewed and stable  Post vital signs: Reviewed and stable  Last Vitals:  Vitals:   06/10/17 2105 06/11/17 0436  BP: 132/70 125/74  Pulse: 74 (!) 58  Resp: 18 18  Temp: 36.9 C 37 C  SpO2: 97% 93%    Last Pain:  Vitals:   06/11/17 1010  TempSrc:   PainSc: 4       Patients Stated Pain Goal: 4 (06/24/74 8832)  Complications: No apparent anesthesia complications

## 2017-06-11 NOTE — Anesthesia Preprocedure Evaluation (Addendum)
Anesthesia Evaluation    Reviewed: Allergy & Precautions, NPO status , Patient's Chart, lab work & pertinent test results, reviewed documented beta blocker date and time   History of Anesthesia Complications (+) PROLONGED EMERGENCE and history of anesthetic complications  Airway Mallampati: II  TM Distance: <3 FB Neck ROM: Full    Dental  (+) Poor Dentition, Missing   Pulmonary shortness of breath, with exertion and Long-Term Oxygen Therapy, asthma , sleep apnea and Continuous Positive Airway Pressure Ventilation ,    breath sounds clear to auscultation       Cardiovascular hypertension, Pt. on home beta blockers  Rhythm:Regular Rate:Normal     Neuro/Psych  Headaches, Anxiety TIA   GI/Hepatic hiatal hernia, GERD  ,  Endo/Other  Morbid obesity  Renal/GU      Musculoskeletal  (+) Arthritis ,   Abdominal   Peds  Hematology   Anesthesia Other Findings   Reproductive/Obstetrics                             Anesthesia Physical  Anesthesia Plan  ASA: III  Anesthesia Plan: General   Post-op Pain Management:    Induction: Intravenous  PONV Risk Score and Plan:   Airway Management Planned: Oral ETT  Additional Equipment:   Intra-op Plan:   Post-operative Plan: Extubation in OR and Possible Post-op intubation/ventilation  Informed Consent: I have reviewed the patients History and Physical, chart, labs and discussed the procedure including the risks, benefits and alternatives for the proposed anesthesia with the patient or authorized representative who has indicated his/her understanding and acceptance.   Dental advisory given  Plan Discussed with: Anesthesiologist and Surgeon  Anesthesia Plan Comments:         Anesthesia Quick Evaluation

## 2017-06-11 NOTE — Anesthesia Procedure Notes (Signed)
Procedure Name: Intubation Date/Time: 06/11/2017 2:39 PM Performed by: Glory Buff, CRNA Pre-anesthesia Checklist: Patient identified, Emergency Drugs available, Suction available and Patient being monitored Patient Re-evaluated:Patient Re-evaluated prior to induction Oxygen Delivery Method: Circle system utilized Preoxygenation: Pre-oxygenation with 100% oxygen Induction Type: IV induction Ventilation: Mask ventilation without difficulty Laryngoscope Size: Miller and 3 Grade View: Grade I Tube type: Oral Tube size: 7.0 mm Number of attempts: 1 Airway Equipment and Method: Stylet and Oral airway Placement Confirmation: ETT inserted through vocal cords under direct vision,  positive ETCO2 and breath sounds checked- equal and bilateral Secured at: 20 cm Tube secured with: Tape Dental Injury: Teeth and Oropharynx as per pre-operative assessment

## 2017-06-11 NOTE — Progress Notes (Signed)
Pt. question about CPAP, although is currently on post operative-PCA pump with EtC02 in place, pt. did ask for prn Albuterol aerosol and used Dulera inhaler. RT to follow on rounds.

## 2017-06-11 NOTE — Progress Notes (Signed)
    CC: Abdominal pain  Subjective: No real change, continues to have pain, no better or worse per pt.  She got the bowel prep and seems to have tolerated it well.    Objective: Vital signs in last 24 hours: Temp:  [98.4 F (36.9 C)-98.6 F (37 C)] 98.6 F (37 C) (12/06 0436) Pulse Rate:  [58-74] 58 (12/06 0436) Resp:  [18] 18 (12/06 0436) BP: (109-132)/(70-76) 125/74 (12/06 0436) SpO2:  [93 %-97 %] 93 % (12/06 0436) Weight:  [100.7 kg (222 lb 1.6 oz)] 100.7 kg (222 lb 1.6 oz) (12/06 0543) Last BM Date: 06/10/17 1882 PO 1200 IV Urine x 9  stool x 16 Afebrile, VSS No labs this AM   Intake/Output from previous day: 12/05 0701 - 12/06 0700 In: 3072 [P.O.:1822; I.V.:1200; IV Piggyback:50] Out: -  Intake/Output this shift: No intake/output data recorded.  General appearance: alert, cooperative and no distress Resp: clear to auscultation bilaterally GI: soft, but ongoing tenderness LLQ, some nausea this AM.  Lab Results:  No results for input(s): WBC, HGB, HCT, PLT in the last 72 hours.  BMET No results for input(s): NA, K, CL, CO2, GLUCOSE, BUN, CREATININE, CALCIUM in the last 72 hours. PT/INR No results for input(s): LABPROT, INR in the last 72 hours.  No results for input(s): AST, ALT, ALKPHOS, BILITOT, PROT, ALBUMIN in the last 168 hours.   Lipase     Component Value Date/Time   LIPASE 23 06/02/2017 2300     Medications: . acetaminophen  1,000 mg Oral On Call to OR  . alvimopan  12 mg Oral On Call to OR  . amLODipine  10 mg Oral Daily  . aspirin EC  81 mg Oral Daily  . atorvastatin  10 mg Oral Daily  . celecoxib  200 mg Oral On Call to OR  . chlorhexidine  15 mL Mouth Rinse BID  . clindamycin / gentamicin INTRAPERITONEAL Lavage irrigation   Intraperitoneal To OR  . dicyclomine  20 mg Oral TID AC & HS  . enoxaparin (LOVENOX) injection  40 mg Subcutaneous Q24H  . feeding supplement (ENSURE ENLIVE)  237 mL Oral BID BM  . fluticasone  2 spray Each Nare  Daily  . gabapentin  300 mg Oral On Call to OR  . mouth rinse  15 mL Mouth Rinse q12n4p  . metoprolol succinate  50 mg Oral QHS  . mometasone-formoterol  2 puff Inhalation BID  . neomycin  1,000 mg Oral TID  . pantoprazole  40 mg Oral Daily  . saccharomyces boulardii  250 mg Oral BID  . simethicone  80 mg Oral QID  . topiramate  50 mg Oral BID    Assessment/Plan Diverticulitis vs colitis - last colonoscopy 08/26/2016 Dr. Havery Moros showed diverticulosis in the transverse colon and in the left colon -CT 11/28 showed focal wall thickening with moderate surrounding inflammation at the sigmoid colon, diverticulitisvs colitis at thesplenic flexure and descending colon possibly 2/2infectious or inflammatory bowel disease - CT repeated 12/4 showing unchanged sigmoid diverticulitis without perforation, previously described wall thickening at the splenic flexure and descending colon is no longer identified -afebrile   HTN H/o CVA GERD OSA Chronic repsiratory failure Morbid obesity Anxiety Combined systolic and diastolic CHF  ID -cipro/flagyl 11/28>>12/3, invanz 12/3>>day#3 FEN -IVF/NPO VTE -SCDs, lovenox  Plan:  Surgery later today.           LOS: 8 days    Ann [name] 06/11/2017 [phone]

## 2017-06-11 NOTE — Op Note (Signed)
Open Sigmoid Colectomy  Indications: This patient has had sigmoid diverticulitis for 3-4 weeks and has not improved despite prolonged IV antibiotics, bowel rest, and change of antibiotics.  She presents for sigmoid colectomy.  She had a colonoscopy in February of this year showing 1 polyp and significant diverticulosis in sigmoid colon and scattered diverticuli in the transverse and descending colon  Pre-operative Diagnosis: sigmoid diverticulitis.  Post-operative Diagnosis: Same  Surgeon: [name]   Assistants: Leighton Ruff, MD  Anesthesia: General endotracheal anesthesia and Local anesthesia 1% plain lidocaine, 0.5% bupivacaine, with epinephrine  ASA Class: 3  Procedure Details  The patient was seen in the Holding Room. The risks, benefits, complications, treatment options, and expected outcomes were discussed with the patient. The possibilities of reaction to medication, perforation of viscus, bleeding, recurrent infection, finding a normal colon, the need for additional procedures, failure to diagnose a condition, and creating a complication requiring transfusion or operation were discussed with the patient. The patient was advised of the risk of ostomy.  The patient concurred with the proposed plan, giving informed consent.   The patient was taken to the operating room, identified, and the procedure verified as open partial colectomy with possible ostomy. A Time Out was held and the above information confirmed.  The patient was brought to the operating room and placed into low lithotomy position. After induction of a general anesthetic, a Foley catheter was inserted and the abdomen was prepped and draped in standard fashion. A lower midline incision was made with a #10 blade.  The Bookwalter retractor was placed to assist with visualization.  Exploration revealed a normal omentum, small bowel, peritoneum, liver, and stomach.  The small bowel was retracted away.    The descending colon  and splenic flexure were then mobilized with gentle retraction of the colon in a medial direction with mobilization of the peritoneal reflection with cautery and the EnSeal. Mobilization of this area was complete to expose the retroperitoneum. The left colon was able to mobilize downward.    The sigmoid colon was very adherent to the left fallopian tube and ovary.  Most of the adhesions could be taken down bluntly.  The right angle was used as well to take these down.  The colon was divided at the proximal sigmoid with a GIA 75 mm stapler.  The mesocolon was divided with the Ligasure, taking care to stay high on the colon to avoid the ureter.  Once the sigmoid was fully mobilized, the proximal rectum/distal sigmoid junction was divided with the contour stapler.  The sites of division were soft and uninflamed.  The specimen was submitted to pathology.      An end-to-end anastomosis was performed with the EEA stapling device. The proximal end was opened and the sizers used to determine which EEA to use.  The 29 mm stapler was selected.  A 2-0 prolene was used with the pursestring device to hold the anvil.  The descending colon was mobilized.  The length of the colon was good with no tension.    The stapler was placed via the anus, the spike deployed, and the stapler coupled without difficulty.   The colon ends were pulled together with the stapler.  Pressure was held for 1 minute.  Prior to stapling, the vagina was rechecked to make sure the stapler did not incorporate the posterior vaginal wall.  The stapler was fired and pressure held for another 30 seconds.  The stapler was removed.  The anastamotic rings were robust and complete.  The proctoscope was used to insufflate the rectum to test the anastamosis.  No leak was seen.  The anastamosis was at approximately 15 cm.  No bleeding was seen from the anastamosis.  The left ovarian ligament was oozing just a bit and this was oversewn.  .    The colon protocol  was followed.  The abdomen was irrigated with antibiotic irrigation.  The fascia was closed with running #1 looped PDS suture.  The skin was irrigated. The midline skin was closed with staples and a provena wound vac was placed over the incision.    Instrument, sponge, and needle counts were correct prior to abdominal closure and at the conclusion of the case.   Findings: inflamed distal sigmoid.  No abscess.    Estimated Blood Loss: 150 mL             Specimens: rectosigmoid            Complications: None; patient tolerated the procedure well.         Disposition: PACU - hemodynamically stable.         Condition: stable

## 2017-06-11 NOTE — Anesthesia Postprocedure Evaluation (Signed)
Anesthesia Post Note  Patient: Ann [name]  Procedure(s) Performed: OPEN SIGMOID COLECTOMY (N/A Abdomen)     Patient location during evaluation: PACU Anesthesia Type: General Level of consciousness: sedated Pain management: pain level controlled Vital Signs Assessment: post-procedure vital signs reviewed and stable Respiratory status: spontaneous breathing and respiratory function stable Cardiovascular status: stable Postop Assessment: no apparent nausea or vomiting Anesthetic complications: no    Last Vitals:  Vitals:   06/11/17 1700 06/11/17 1715  BP: 139/74 136/69  Pulse: 86 80  Resp: 20 16  Temp:  36.8 C  SpO2: 97% 93%    Last Pain:  Vitals:   06/11/17 1715  TempSrc:   PainSc: Asleep                 Ann [name]

## 2017-06-11 NOTE — Progress Notes (Deleted)
rx

## 2017-06-11 NOTE — Progress Notes (Signed)
[facility]  PROGRESS NOTE  Ann [name] HQP:[id number] DOB: July 21, 1955 DOA: 06/02/2017 PCP: Berkley Harvey, NP   Brief HPI:    61 y.o.femalewith medical history significant ofrecurrent diverticulitis, hypertension, hyperlipidemia, stroke, TIA, GERD, anxiety, OSA on CPAP, obesity, CHF with EF of 45-50 percent, chronic respiratory failure on 4 L oxygen at home, who presents with nausea, vomiting, diarrhea and lower abdominal pain. Patient was just discharged from the hospital on 05/28/2017.  She was supposed to follow-up surgery for possible hemicolectomy as outpatient.  Patient came back with abdominal pain was found to have sigmoid diverticulitis.  Has had multiple rounds of antibiotics and has been seen by GI and surgery in the hospital.  At this time as patient is not improved with IV antibiotics plan is for hemicolectomy in a.m.   Subjective    continues to have pain, no better or worse per pt.     Assessment/Plan:     Recurrent diverticulitis/colitis-diet again changed to liquid per GI.CT scan shows sigmoid colon with colitis and possible colitis and splenic flexure and descending colon.  No perforation or abscess.  GI was consulted.  Plan is continue  treating for infectious etiology.   Antibiotics changed from Cipro/Flagyl  to Invanzcipro/flagyl 11/28>>12/3, invanz 12/3>>day#3, general surgery was consulted.  No plan for immediate surgery at this time.  GI and surgery are following.  CT scan shows persistent sigmoid diverticulitis despite being on antibiotics.  Plan is for hemicolectomy as per general surgery as patient is not improving on abx .  Question right hydronephrosis-CT scan shows distended right renal pelvis and inflammation around distal right ureter.    discussed with urology ,see below  Hypertension- blood pressure controlled, continue amlodipine, metoprolol  .chronic respiratory failure with hypoxia- Patient on 4 L of nasal cannula with oxygen at home  continue as needed albuterol.  Dysuria-patient complained of dysuria.  Urine culture showed insignificant growth.  Hypertension-continue Lipitor  History of stroke-continue aspirin, Lipitor.  Anxiety-continue as needed Klonopin  Chronic combined systolic and diastolic CHF-well compensated.  Lasix on hold.   Mild fullness right collecting system likely from pelvic inflammation,  No need for stent as per Dr Matilde Sprang  F/up with Dr Jeffie Pollock electively      DVT prophylaxis: Lovenox  Code Status: Full code  Family Communication: No family bedside  Disposition Plan: Likely home next week   Consultants:  Gastroenterology  surgery  Procedures:  None  Continuous infusions . cefoTEtan (CEFOTAN) IV    . ertapenem Stopped (06/11/17 1004)  . 0.9 % sodium chloride with kcl Stopped (06/11/17 1356)      Antibiotics:   Anti-infectives (From admission, onward)   Start     Dose/Rate Route Frequency Ordered Stop   06/11/17 1340  cefoTEtan in Dextrose 5% (CEFOTAN) 2-2.08 GM-%(50ML) IVPB    Comments:  Algis Liming   : cabinet override      06/11/17 1340 06/12/17 0144   06/11/17 1323  ceFAZolin (ANCEF) 2-4 GM/100ML-% IVPB  Status:  Discontinued    Comments:  Jones, Sherrie   : cabinet override      06/11/17 1323 06/11/17 1342   06/11/17 0600  cefoTEtan (CEFOTAN) 2 g in dextrose 5 % 50 mL IVPB     2 g 100 mL/hr over 30 Minutes Intravenous On call to O.R. 06/10/17 1255 06/12/17 0559   06/11/17 0600  clindamycin (CLEOCIN) 900 mg, gentamicin (GARAMYCIN) 240 mg in sodium chloride 0.9 % 1,000 mL for intraperitoneal lavage  Intraperitoneal To Surgery 06/10/17 1255 06/12/17 0600   06/10/17 1600  neomycin (MYCIFRADIN) tablet 1,000 mg     1,000 mg Oral 3 times daily 06/10/17 1255 06/11/17 1559   06/08/17 1000  ertapenem (INVANZ) 1 g in sodium chloride 0.9 % 50 mL IVPB     1 g 100 mL/hr over 30 Minutes Intravenous Every 24 hours 06/08/17 0930     06/03/17 1200  ciprofloxacin  (CIPRO) IVPB 400 mg  Status:  Discontinued     400 mg 200 mL/hr over 60 Minutes Intravenous Every 12 hours 06/03/17 0207 06/08/17 0930   06/03/17 0445  metroNIDAZOLE (FLAGYL) IVPB 500 mg  Status:  Discontinued     500 mg 100 mL/hr over 60 Minutes Intravenous Every 8 hours 06/03/17 0200 06/08/17 0930   06/03/17 0100  ciprofloxacin (CIPRO) IVPB 400 mg     400 mg 200 mL/hr over 60 Minutes Intravenous  Once 06/03/17 0055 06/03/17 0333   06/03/17 0100  metroNIDAZOLE (FLAGYL) IVPB 500 mg  Status:  Discontinued     500 mg 100 mL/hr over 60 Minutes Intravenous  Once 06/03/17 0055 06/03/17 0200       Objective   Vitals:   06/10/17 1519 06/10/17 2105 06/11/17 0436 06/11/17 0543  BP: 109/76 132/70 125/74   Pulse: 72 74 (!) 58   Resp: 18 18 18    Temp: 98.5 F (36.9 C) 98.4 F (36.9 C) 98.6 F (37 C)   TempSrc: Oral Oral Oral   SpO2: 93% 97% 93%   Weight:    100.7 kg (222 lb 1.6 oz)  Height:        Intake/Output Summary (Last 24 hours) at 06/11/2017 1357 Last data filed at 06/11/2017 1227 Gross per 24 hour  Intake 2582 ml  Output -  Net 2582 ml   Filed Weights   06/09/17 0500 06/10/17 0420 06/11/17 0543  Weight: 101.9 kg (224 lb 10.4 oz) 101.3 kg (223 lb 5.2 oz) 100.7 kg (222 lb 1.6 oz)     Physical Examination:  Physical Exam: Eyes: No icterus, extraocular muscles intact  Mouth: Oral mucosa is moist, no lesions on palate,  Neck: Supple, no deformities, masses, or tenderness Lungs: Normal respiratory effort, bilateral clear to auscultation, no crackles or wheezes.  Heart: Regular rate and rhythm, S1 and S2 normal, no murmurs, rubs auscultated Abdomen: BS normoactive,soft,nondistended, left lower quadrant tenderness to palpation Extremities: No pretibial edema, no erythema, no cyanosis, no clubbing Neuro : Alert and oriented to time, place and person, No focal deficits Skin: No rashes seen on exam    Data Reviewed: I have personally reviewed following labs and imaging  studies  CBG: No results for input(s): GLUCAP in the last 168 hours.  CBC: Recent Labs  Lab 06/08/17 0424  WBC 7.2  HGB 11.8*  HCT 36.9  MCV 100.8*  PLT 161    Basic Metabolic Panel: Recent Labs  Lab 06/05/17 0450 06/08/17 0424  NA 142 140  K 4.0 3.8  CL 109 109  CO2 29 26  GLUCOSE 99 103*  BUN 5* 9  CREATININE 1.03* 0.89  CALCIUM 8.7* 8.5*    Recent Results (from the past 240 hour(s))  Culture, blood (Routine X 2) w Reflex to ID Panel     Status: None   Collection Time: 06/03/17  2:03 AM  Result Value Ref Range Status   Specimen Description BLOOD RIGHT ANTECUBITAL  Final   Special Requests   Final    BOTTLES DRAWN AEROBIC AND ANAEROBIC Blood  Culture adequate volume   Culture   Final    NO GROWTH 5 DAYS Performed at [facility], [place] [address] [place], [place] [address]    Report Status 06/08/2017 FINAL  Final  Culture, blood (Routine X 2) w Reflex to ID Panel     Status: None   Collection Time: 06/03/17  2:08 AM  Result Value Ref Range Status   Specimen Description BLOOD LEFT WRIST  Final   Special Requests IN PEDIATRIC BOTTLE Blood Culture adequate volume  Final   Culture   Final    NO GROWTH 5 DAYS Performed at [facility], [place] [address], [place], [place] [address]    Report Status 06/08/2017 FINAL  Final  Urine Culture     Status: Abnormal   Collection Time: 06/03/17  6:50 AM  Result Value Ref Range Status   Specimen Description URINE, CLEAN CATCH  Final   Special Requests NONE  Final   Culture (A)  Final    <10,000 COLONIES/mL INSIGNIFICANT GROWTH Performed at [facility], [place] [address], [place], [place] [address]    Report Status 06/04/2017 FINAL  Final     Liver Function Tests: No results for input(s): AST, ALT, ALKPHOS, BILITOT, PROT, ALBUMIN in the last 168 hours. No results for input(s): LIPASE, AMYLASE in the last 168 hours. No results for input(s): AMMONIA in the last 168 hours.  Cardiac Enzymes: No results for  input(s): CKTOTAL, CKMB, CKMBINDEX, TROPONINI in the last 168 hours. BNP (last 3 results) Recent Labs    01/11/17 1106 06/03/17 0550  BNP 10.4 17.5    ProBNP (last 3 results) No results for input(s): PROBNP in the last 8760 hours.    Studies: Ct Abdomen Pelvis W Contrast  Result Date: 06/09/2017 CLINICAL DATA:  Nausea, vomiting, and diarrhea. Lower abdominal pain. History of diverticulitis. EXAM: CT ABDOMEN AND PELVIS WITH CONTRAST TECHNIQUE: Multidetector CT imaging of the abdomen and pelvis was performed using the standard protocol following bolus administration of intravenous contrast. CONTRAST:  151mL ISOVUE-300 IOPAMIDOL (ISOVUE-300) INJECTION 61% COMPARISON:  CT abdomen pelvis dated June 03, 2017. FINDINGS: Lower chest: No acute abnormality. Hepatobiliary: No focal liver abnormality is seen. Status post cholecystectomy. No biliary dilatation. Pancreas: Unremarkable. No pancreatic ductal dilatation or surrounding inflammatory changes. Spleen: Normal in size without focal abnormality. Adrenals/Urinary Tract: The adrenal glands are unremarkable. Slightly increased prominence of the right renal collecting system when compared to prior study. Unchanged mild atrophy of the right kidney with stable subcentimeter low-density lesion, too small to characterize. Left kidney is unremarkable. The bladder is unremarkable. Stomach/Bowel: Persistent wall thickening and inflammatory stranding surrounding the sigmoid colon, similar to prior study. Previously described wall thickening at the splenic flexure and descending colon is no longer identified, and may have been related to underdistention. The stomach and small bowel are unremarkable. Normal appendix. Vascular/Lymphatic: Aortic atherosclerosis. No enlarged abdominal or pelvic lymph nodes. Reproductive: Uterus and bilateral adnexa are unremarkable. Other: No free fluid or pneumoperitoneum. No drainable fluid collection. Musculoskeletal: No acute or  significant osseous findings. Degenerative changes of the lumbar spine. IMPRESSION: 1. Unchanged acute sigmoid diverticulitis. No perforation or drainable fluid collection. 2. Slightly increased prominence of the right renal collecting system when compared to prior study, without evidence of obstructing lesion. Findings could be related to pelvic inflammation surrounding the distal right ureter. 3.  Aortic atherosclerosis (ICD10-I70.0). Electronically Signed   By: Titus Dubin M.D.   On: 06/09/2017 19:48    Scheduled  Meds: . acetaminophen      . alvimopan      . amLODipine  10 mg Oral Daily  . aspirin EC  81 mg Oral Daily  . atorvastatin  10 mg Oral Daily  . cefoTEtan in Dextrose 5%      . celecoxib      . chlorhexidine  15 mL Mouth Rinse BID  . clindamycin / gentamicin INTRAPERITONEAL Lavage irrigation   Intraperitoneal To OR  . dicyclomine  20 mg Oral TID AC & HS  . enoxaparin (LOVENOX) injection  40 mg Subcutaneous Q24H  . feeding supplement (ENSURE ENLIVE)  237 mL Oral BID BM  . fluticasone  2 spray Each Nare Daily  . gabapentin      . mouth rinse  15 mL Mouth Rinse q12n4p  . metoprolol succinate  50 mg Oral QHS  . mometasone-formoterol  2 puff Inhalation BID  . neomycin  1,000 mg Oral TID  . pantoprazole  40 mg Oral Daily  . saccharomyces boulardii  250 mg Oral BID  . simethicone  80 mg Oral QID  . topiramate  50 mg Oral BID      Time spent: 25 min  Reyne Dumas   [facility] Pager [phone]. If 7PM-7AM, please contact night-coverage at [email or web address], Office  [phone]  password [email or web address]  06/11/2017, 1:57 PM  LOS: 8 days

## 2017-06-12 ENCOUNTER — Encounter (HOSPITAL_COMMUNITY): Payer: Self-pay

## 2017-06-12 LAB — COMPREHENSIVE METABOLIC PANEL
ALT: 14 U/L (ref 14–54)
AST: 20 U/L (ref 15–41)
Albumin: 3 g/dL — ABNORMAL LOW (ref 3.5–5.0)
Alkaline Phosphatase: 51 U/L (ref 38–126)
Anion gap: 6 (ref 5–15)
BUN: 10 mg/dL (ref 6–20)
CHLORIDE: 106 mmol/L (ref 101–111)
CO2: 26 mmol/L (ref 22–32)
Calcium: 8.4 mg/dL — ABNORMAL LOW (ref 8.9–10.3)
Creatinine, Ser: 0.75 mg/dL (ref 0.44–1.00)
Glucose, Bld: 172 mg/dL — ABNORMAL HIGH (ref 65–99)
POTASSIUM: 5 mmol/L (ref 3.5–5.1)
SODIUM: 138 mmol/L (ref 135–145)
Total Bilirubin: 0.7 mg/dL (ref 0.3–1.2)
Total Protein: 6.9 g/dL (ref 6.5–8.1)

## 2017-06-12 LAB — CBC
HCT: 36.6 % (ref 36.0–46.0)
Hemoglobin: 11.8 g/dL — ABNORMAL LOW (ref 12.0–15.0)
MCH: 32.2 pg (ref 26.0–34.0)
MCHC: 32.2 g/dL (ref 30.0–36.0)
MCV: 100 fL (ref 78.0–100.0)
PLATELETS: 317 10*3/uL (ref 150–400)
RBC: 3.66 MIL/uL — ABNORMAL LOW (ref 3.87–5.11)
RDW: 13.9 % (ref 11.5–15.5)
WBC: 9.5 10*3/uL (ref 4.0–10.5)

## 2017-06-12 MED ORDER — ALUM & MAG HYDROXIDE-SIMETH 200-200-20 MG/5ML PO SUSP
30.0000 mL | ORAL | Status: DC | PRN
  Filled 2017-06-12: qty 30

## 2017-06-12 NOTE — Progress Notes (Signed)
[facility] PROGRESS NOTE  Ann [name] KGM:[id number] DOB: August 09, 1955 DOA: 06/02/2017 PCP: Berkley Harvey, NP  Brief summary   61 y.o.femalewith medical history significant ofrecurrent diverticulitis, hypertension, hyperlipidemia, stroke, TIA, GERD, anxiety, OSA on CPAP, obesity, CHF with EF of 45-50 percent, chronic respiratory failure on 4 L oxygen at home, who presents with nausea, vomiting, diarrhea and lower abdominal pain. Patient was just discharged from the hospital on 05/28/2017.She was supposed to follow-up surgery for possible hemicolectomy as outpatient.  Patient came back with abdominal pain was found to have sigmoid diverticulitis.  Has had multiple rounds of antibiotics and has been seen by GI and surgery in the hospital.  At this time as patient is not improved with IV antibiotics and underwent hemicolectomy on 12/6   Assessment/Plan:  Recurrent diverticulitis/colitis-CT scan shows sigmoid colon with colitis and possible colitis and splenic flexure and descending colon.  No perforation or abscess.  GI was consulted.  Treated  With antibiotics: cipro/Flagyl  Then  Invanzcipro/flagyl 11/28>>12/3, invanz 12/3>>. General surgery was consulted. Repeat   CT scan shows persistent sigmoid diverticulitis despite being on antibiotics. Underwent  Hemicolectomy on 12/6. Cont per surgery, appreciate input   Question right hydronephrosis-CT scan shows distended right renal pelvis and inflammation around distal right ureter.    discussed with urology ,see below  Mild fullness right collecting system likely from pelvic inflammation,No need for stent as per Dr Matilde Sprang  F/up with Dr Jeffie Pollock electively  Hypertension- blood pressure controlled, continue amlodipine, metoprolol  Chronic respiratory failure with hypoxia- Patient on 4 L of nasal cannula with oxygen at home continue as needed albuterol.  Dysuria-patient complained of dysuria.  Urine culture showed insignificant  growth.  History of stroke-continue aspirin, Lipitor.  Anxiety-continue as needed Klonopin  Chronic combined systolic and diastolic CHF-well compensated.  Lasix on hold.   DVT prophylaxis: Lovenox  Code Status: Full code  Family Communication: No family bedside  Disposition Plan: Likely home next week   Consultants:  Gastroenterology  surgery  Procedures: -Colectomy    Antibiotics: Anti-infectives (From admission, onward)   Start     Dose/Rate Route Frequency Ordered Stop   06/11/17 1600  polymyxin B 500,000 Units, bacitracin 50,000 Units in sodium chloride 0.9 % 500 mL irrigation  Status:  Discontinued       As needed 06/11/17 1600 06/11/17 1647   06/11/17 1340  cefoTEtan in Dextrose 5% (CEFOTAN) 2-2.08 GM-%(50ML) IVPB    Comments:  Algis Liming   : cabinet override      06/11/17 1340 06/12/17 0144   06/11/17 1323  ceFAZolin (ANCEF) 2-4 GM/100ML-% IVPB  Status:  Discontinued    Comments:  Jones, Sherrie   : cabinet override      06/11/17 1323 06/11/17 1342   06/11/17 0600  cefoTEtan (CEFOTAN) 2 g in dextrose 5 % 50 mL IVPB     2 g 100 mL/hr over 30 Minutes Intravenous On call to O.R. 06/10/17 1255 06/11/17 1511   06/11/17 0600  clindamycin (CLEOCIN) 900 mg, gentamicin (GARAMYCIN) 240 mg in sodium chloride 0.9 % 1,000 mL for intraperitoneal lavage  Status:  Discontinued      Intraperitoneal To Surgery 06/10/17 1255 06/11/17 1739   06/10/17 1600  neomycin (MYCIFRADIN) tablet 1,000 mg     1,000 mg Oral 3 times daily 06/10/17 1255 06/11/17 1559   06/08/17 1000  ertapenem (INVANZ) 1 g in sodium chloride 0.9 % 50 mL IVPB  Status:  Discontinued     1 g 100 mL/hr  over 30 Minutes Intravenous Every 24 hours 06/08/17 0930 06/12/17 0735   06/03/17 1200  ciprofloxacin (CIPRO) IVPB 400 mg  Status:  Discontinued     400 mg 200 mL/hr over 60 Minutes Intravenous Every 12 hours 06/03/17 0207 06/08/17 0930   06/03/17 0445  metroNIDAZOLE (FLAGYL) IVPB 500 mg  Status:   Discontinued     500 mg 100 mL/hr over 60 Minutes Intravenous Every 8 hours 06/03/17 0200 06/08/17 0930   06/03/17 0100  ciprofloxacin (CIPRO) IVPB 400 mg     400 mg 200 mL/hr over 60 Minutes Intravenous  Once 06/03/17 0055 06/03/17 0333   06/03/17 0100  metroNIDAZOLE (FLAGYL) IVPB 500 mg  Status:  Discontinued     500 mg 100 mL/hr over 60 Minutes Intravenous  Once 06/03/17 0055 06/03/17 0200        (indicate start date, and stop date if known)  HPI/Subjective: Alert. Reports post  Pain. No vomiting   Objective: Vitals:   06/12/17 1109 06/12/17 1312  BP:  111/67  Pulse:  83  Resp: (!) 21 18  Temp:  98.3 F (36.8 C)  SpO2: 96% 98%    Intake/Output Summary (Last 24 hours) at 06/12/2017 1318 Last data filed at 06/12/2017 1315 Gross per 24 hour  Intake 2234.17 ml  Output 1270 ml  Net 964.17 ml   Filed Weights   06/10/17 0420 06/11/17 0543 06/12/17 0610  Weight: 101.3 kg (223 lb 5.2 oz) 100.7 kg (222 lb 1.6 oz) 100.2 kg (220 lb 14.4 oz)    Exam:   General:  No distress   Cardiovascular: s1,s2 rrr  Respiratory: CTA BL  Abdomen: soft, post op tender   Musculoskeletal: no leg edema    Data Reviewed: Basic Metabolic Panel: Recent Labs  Lab 06/08/17 0424 06/12/17 0439  NA 140 138  K 3.8 5.0  CL 109 106  CO2 26 26  GLUCOSE 103* 172*  BUN 9 10  CREATININE 0.89 0.75  CALCIUM 8.5* 8.4*   Liver Function Tests: Recent Labs  Lab 06/12/17 0439  AST 20  ALT 14  ALKPHOS 51  BILITOT 0.7  PROT 6.9  ALBUMIN 3.0*   No results for input(s): LIPASE, AMYLASE in the last 168 hours. No results for input(s): AMMONIA in the last 168 hours. CBC: Recent Labs  Lab 06/08/17 0424 06/12/17 0439  WBC 7.2 9.5  HGB 11.8* 11.8*  HCT 36.9 36.6  MCV 100.8* 100.0  PLT 335 317   Cardiac Enzymes: No results for input(s): CKTOTAL, CKMB, CKMBINDEX, TROPONINI in the last 168 hours. BNP (last 3 results) Recent Labs    01/11/17 1106 06/03/17 0550  BNP 10.4 17.5     ProBNP (last 3 results) No results for input(s): PROBNP in the last 8760 hours.  CBG: No results for input(s): GLUCAP in the last 168 hours.  Recent Results (from the past 240 hour(s))  Culture, blood (Routine X 2) w Reflex to ID Panel     Status: None   Collection Time: 06/03/17  2:03 AM  Result Value Ref Range Status   Specimen Description BLOOD RIGHT ANTECUBITAL  Final   Special Requests   Final    BOTTLES DRAWN AEROBIC AND ANAEROBIC Blood Culture adequate volume   Culture   Final    NO GROWTH 5 DAYS Performed at [facility], [address] [place],  [address]    Report Status 06/08/2017 FINAL  Final  Culture, blood (Routine X 2) w Reflex to ID Panel  Status: None   Collection Time: 06/03/17  2:08 AM  Result Value Ref Range Status   Specimen Description BLOOD LEFT WRIST  Final   Special Requests IN PEDIATRIC BOTTLE Blood Culture adequate volume  Final   Culture   Final    NO GROWTH 5 DAYS Performed at [facility], [address], [place], [place] [address]    Report Status 06/08/2017 FINAL  Final  Urine Culture     Status: Abnormal   Collection Time: 06/03/17  6:50 AM  Result Value Ref Range Status   Specimen Description URINE, CLEAN CATCH  Final   Special Requests NONE  Final   Culture (A)  Final    <10,000 COLONIES/mL INSIGNIFICANT GROWTH Performed at [facility], [place] [address], [place],  [address]    Report Status 06/04/2017 FINAL  Final  MRSA PCR Screening     Status: None   Collection Time: 06/11/17  5:14 AM  Result Value Ref Range Status   MRSA by PCR NEGATIVE NEGATIVE Final    Comment:        The GeneXpert MRSA Assay (FDA approved for NASAL specimens only), is one component of a comprehensive MRSA colonization surveillance program. It is not intended to diagnose MRSA infection nor to guide or monitor treatment for MRSA infections.      Studies: No results found.  Scheduled Meds: . alvimopan  12 mg Oral  BID  . amLODipine  10 mg Oral Daily  . aspirin EC  81 mg Oral Daily  . atorvastatin  10 mg Oral Daily  . chlorhexidine  15 mL Mouth Rinse BID  . dicyclomine  20 mg Oral TID AC & HS  . enoxaparin (LOVENOX) injection  40 mg Subcutaneous Q24H  . feeding supplement (ENSURE ENLIVE)  237 mL Oral BID BM  . fluticasone  2 spray Each Nare Daily  . HYDROmorphone   Intravenous Q4H  . mouth rinse  15 mL Mouth Rinse q12n4p  . metoprolol succinate  50 mg Oral QHS  . mometasone-formoterol  2 puff Inhalation BID  . pantoprazole  40 mg Oral Daily  . saccharomyces boulardii  250 mg Oral BID  . simethicone  80 mg Oral QID  . topiramate  50 mg Oral BID   Continuous Infusions: . 0.9 % sodium chloride with kcl 50 mL/hr at 06/11/17 2207    Principal Problem:   Diverticulitis of colon Active Problems:   Essential hypertension   OSA (obstructive sleep apnea)   Chronic respiratory failure with hypoxia (HCC)   Morbid (severe) obesity due to excess calories (HCC)   HLD (hyperlipidemia)   Stroke (cerebrum) (HCC)   GERD (gastroesophageal reflux disease)   Anxiety   Chronic combined systolic (congestive) and diastolic (congestive) heart failure (HCC)   Hypokalemia   Diverticulitis large intestine   Non-intractable vomiting with nausea   Lower abdominal pain   Abnormal CT scan, colon    Time spent: >365 minutes     Kinnie Feil  [facility] Pager [phone]. If 7PM-7AM, please contact night-coverage at [email or web address], password [facility] 06/12/2017, 1:18 PM  LOS: 9 days

## 2017-06-12 NOTE — Progress Notes (Signed)
1 Day Post-Op    CC: Abdominal pain  Subjective: Pt complains of incisional pain.  Denies nausea.  No flatus.  Pt was found to have severe chronic inflammation of distal sigmoid.  It is not surprising that she was previously not tender, but was having a lot of spasmotic pain in the pelvis.    Objective: Vital signs in last 24 hours: Temp:  [97.6 F (36.4 C)-98.3 F (36.8 C)] 98.3 F (36.8 C) (12/07 1312) Pulse Rate:  [80-98] 83 (12/07 1312) Resp:  [10-21] 18 (12/07 1312) BP: (107-140)/(67-98) 111/67 (12/07 1312) SpO2:  [90 %-98 %] 98 % (12/07 1312) Weight:  [100.2 kg (220 lb 14.4 oz)] 100.2 kg (220 lb 14.4 oz) (12/07 0610) Last BM Date: 06/11/17(pre-op)   Intake/Output from previous day: 12/06 0701 - 12/07 0700 In: 2234.2 [P.O.:240; I.V.:1794.2; IV Piggyback:50] Out: 470 [Urine:420; Blood:50] Intake/Output this shift: Total I/O In: -  Out: 800 [Urine:800]  General appearance: alert, cooperative and no distress Resp: breathing comfortably GI: incision c/d/i with provena incisional wound vac in place.  abd slightly distended.    Lab Results:  Recent Labs    06/12/17 0439  WBC 9.5  HGB 11.8*  HCT 36.6  PLT 317    BMET Recent Labs    06/12/17 0439  NA 138  K 5.0  CL 106  CO2 26  GLUCOSE 172*  BUN 10  CREATININE 0.75  CALCIUM 8.4*   PT/INR No results for input(s): LABPROT, INR in the last 72 hours.  Recent Labs  Lab 06/12/17 0439  AST 20  ALT 14  ALKPHOS 51  BILITOT 0.7  PROT 6.9  ALBUMIN 3.0*     Lipase     Component Value Date/Time   LIPASE 23 06/02/2017 2300     Medications: . alvimopan  12 mg Oral BID  . amLODipine  10 mg Oral Daily  . aspirin EC  81 mg Oral Daily  . atorvastatin  10 mg Oral Daily  . chlorhexidine  15 mL Mouth Rinse BID  . dicyclomine  20 mg Oral TID AC & HS  . enoxaparin (LOVENOX) injection  40 mg Subcutaneous Q24H  . feeding supplement (ENSURE ENLIVE)  237 mL Oral BID BM  . fluticasone  2 spray Each Nare Daily   . HYDROmorphone   Intravenous Q4H  . mouth rinse  15 mL Mouth Rinse q12n4p  . metoprolol succinate  50 mg Oral QHS  . mometasone-formoterol  2 puff Inhalation BID  . pantoprazole  40 mg Oral Daily  . saccharomyces boulardii  250 mg Oral BID  . simethicone  80 mg Oral QID  . topiramate  50 mg Oral BID    Assessment/Plan Diverticulitis vs colitis - last colonoscopy 08/26/2016 Dr. Havery Moros showed diverticulosis in the transverse colon and in the left colon -CT 11/28 showed focal wall thickening with moderate surrounding inflammation at the sigmoid colon, diverticulitisvs colitis at thesplenic flexure and descending colon possibly 2/2infectious or inflammatory bowel disease - CT repeated 12/4 showing unchanged sigmoid diverticulitis without perforation, previously described wall thickening at the splenic flexure and descending colon is no longer identified -afebrile   POD 1 open sigmoid colectomy 06/11/17- Ann [name]  HTN H/o CVA GERD OSA Chronic repsiratory failure Morbid obesity Anxiety Combined systolic and diastolic CHF  ID -d/c antibiotics FEN -clears.  Await return of bowel function VTE -SCDs, lovenox           LOS: 9 days    Ann [name] 06/12/2017 [phone]

## 2017-06-13 MED ORDER — METHOCARBAMOL 1000 MG/10ML IJ SOLN
500.0000 mg | Freq: Three times a day (TID) | INTRAVENOUS | Status: AC
  Administered 2017-06-13 – 2017-06-15 (×6): 500 mg via INTRAVENOUS
  Filled 2017-06-13 (×6): qty 550

## 2017-06-13 NOTE — Progress Notes (Signed)
Assessment Principal Problem:   Medically refractory diverticulitis of colon s/p open sigmoid colectomy 06/11/17 (Dr. Cleta Alberts ileus and pain control problems Active Problems:   Essential hypertension   OSA (obstructive sleep apnea)   Morbid (severe) obesity due to excess calories ([place])     Plan:  Add Robaxin for pain control.  Continue liquids until bowel function starts to return.   LOS: 10 days     2 Days Post-Op  Chief Complaint/Subjective: Had pain control problems and n/v last night.  A little better this morning.  Objective: Vital signs in last 24 hours: Temp:  [97.7 F (36.5 C)-99 F (37.2 C)] 97.7 F (36.5 C) (12/08 6237) Pulse Rate:  [66-83] 80 (12/08 0632) Resp:  [12-21] 14 (12/08 0758) BP: (111-127)/(62-74) 127/74 (12/08 6283) SpO2:  [94 %-100 %] 96 % (12/08 0758) Weight:  [99.5 kg (219 lb 5.7 oz)] 99.5 kg (219 lb 5.7 oz) (12/08 1517) Last BM Date: 06/11/17  Intake/Output from previous day: 12/07 0701 - 12/08 0700 In: 1400 [I.V.:1400] Out: 2150 [Urine:2150] Intake/Output this shift: No intake/output data recorded.  PE: General- In NAD.  Awake and alert. Abdomen-soft, quiet, wound VAC on  Lab Results:  Recent Labs    06/12/17 0439  WBC 9.5  HGB 11.8*  HCT 36.6  PLT 317   BMET Recent Labs    06/12/17 0439  NA 138  K 5.0  CL 106  CO2 26  GLUCOSE 172*  BUN 10  CREATININE 0.75  CALCIUM 8.4*   PT/INR No results for input(s): LABPROT, INR in the last 72 hours. Comprehensive Metabolic Panel:    Component Value Date/Time   NA 138 06/12/2017 0439   NA 140 06/08/2017 0424   K 5.0 06/12/2017 0439   K 3.8 06/08/2017 0424   CL 106 06/12/2017 0439   CL 109 06/08/2017 0424   CO2 26 06/12/2017 0439   CO2 26 06/08/2017 0424   BUN 10 06/12/2017 0439   BUN 9 06/08/2017 0424   CREATININE 0.75 06/12/2017 0439   CREATININE 0.89 06/08/2017 0424   GLUCOSE 172 (H) 06/12/2017 0439   GLUCOSE 103 (H) 06/08/2017 0424   CALCIUM 8.4 (L) 06/12/2017  0439   CALCIUM 8.5 (L) 06/08/2017 0424   AST 20 06/12/2017 0439   AST 17 06/02/2017 2300   ALT 14 06/12/2017 0439   ALT 16 06/02/2017 2300   ALKPHOS 51 06/12/2017 0439   ALKPHOS 62 06/02/2017 2300   BILITOT 0.7 06/12/2017 0439   BILITOT 0.4 06/02/2017 2300   PROT 6.9 06/12/2017 0439   PROT 7.5 06/02/2017 2300   ALBUMIN 3.0 (L) 06/12/2017 0439   ALBUMIN 3.5 06/02/2017 2300     Studies/Results: No results found.  Anti-infectives: Anti-infectives (From admission, onward)   Start     Dose/Rate Route Frequency Ordered Stop   06/11/17 1600  polymyxin B 500,000 Units, bacitracin 50,000 Units in sodium chloride 0.9 % 500 mL irrigation  Status:  Discontinued       As needed 06/11/17 1600 06/11/17 1647   06/11/17 1340  cefoTEtan in Dextrose 5% (CEFOTAN) 2-2.08 GM-%(50ML) IVPB    Comments:  Algis Liming   : cabinet override      06/11/17 1340 06/12/17 0144   06/11/17 1323  ceFAZolin (ANCEF) 2-4 GM/100ML-% IVPB  Status:  Discontinued    Comments:  Jones, Sherrie   : cabinet override      06/11/17 1323 06/11/17 1342   06/11/17 0600  cefoTEtan (CEFOTAN) 2 g in dextrose 5 % 50 mL  IVPB     2 g 100 mL/hr over 30 Minutes Intravenous On call to O.R. 06/10/17 1255 06/11/17 1511   06/11/17 0600  clindamycin (CLEOCIN) 900 mg, gentamicin (GARAMYCIN) 240 mg in sodium chloride 0.9 % 1,000 mL for intraperitoneal lavage  Status:  Discontinued      Intraperitoneal To Surgery 06/10/17 1255 06/11/17 1739   06/10/17 1600  neomycin (MYCIFRADIN) tablet 1,000 mg     1,000 mg Oral 3 times daily 06/10/17 1255 06/11/17 1559   06/08/17 1000  ertapenem (INVANZ) 1 g in sodium chloride 0.9 % 50 mL IVPB  Status:  Discontinued     1 g 100 mL/hr over 30 Minutes Intravenous Every 24 hours 06/08/17 0930 06/12/17 0735   06/03/17 1200  ciprofloxacin (CIPRO) IVPB 400 mg  Status:  Discontinued     400 mg 200 mL/hr over 60 Minutes Intravenous Every 12 hours 06/03/17 0207 06/08/17 0930   06/03/17 0445  metroNIDAZOLE  (FLAGYL) IVPB 500 mg  Status:  Discontinued     500 mg 100 mL/hr over 60 Minutes Intravenous Every 8 hours 06/03/17 0200 06/08/17 0930   06/03/17 0100  ciprofloxacin (CIPRO) IVPB 400 mg     400 mg 200 mL/hr over 60 Minutes Intravenous  Once 06/03/17 0055 06/03/17 0333   06/03/17 0100  metroNIDAZOLE (FLAGYL) IVPB 500 mg  Status:  Discontinued     500 mg 100 mL/hr over 60 Minutes Intravenous  Once 06/03/17 0055 06/03/17 0200       Valjean Ruppel 06/13/2017

## 2017-06-13 NOTE — Progress Notes (Signed)
[facility] PROGRESS NOTE  Ann [name] GEX:[id number] DOB: [birth year] DOA: 06/02/2017 PCP: Berkley Harvey, NP  Brief summary   61 y.o.femalewith medical history significant ofrecurrent diverticulitis, hypertension, hyperlipidemia, stroke, TIA, GERD, anxiety, OSA on CPAP, obesity, CHF with EF of 45-50 percent, chronic respiratory failure on 4 L oxygen at home, who presents with nausea, vomiting, diarrhea and lower abdominal pain. Patient was just discharged from the hospital on 05/28/2017.She was supposed to follow-up surgery for possible hemicolectomy as outpatient.  Patient came back with abdominal pain was found to have sigmoid diverticulitis.  Has had multiple rounds of antibiotics and has been seen by GI and surgery in the hospital.  At this time as patient is not improved with IV antibiotics and underwent hemicolectomy on 12/6   Assessment/Plan:  Recurrent diverticulitis/colitis-CT scan shows sigmoid colon with colitis and possible colitis and splenic flexure and descending colon.  No perforation or abscess.  GI was consulted.  Treated  With antibiotics: cipro/Flagyl  Then  Invanzcipro/flagyl 11/28>>12/3, invanz 12/3>>. General surgery was consulted. Repeat   CT scan shows persistent sigmoid diverticulitis despite being on antibiotics.  -Underwent  Hemicolectomy on 12/6. Cont per surgery, appreciate input   Question right hydronephrosis-CT scan shows distended right renal pelvis and inflammation around distal right ureter. discussed with urology ,see below  Mild fullness right collecting system likely from pelvic inflammation,No need for stent as per Dr Matilde Sprang  F/up with Dr Jeffie Pollock electively  Hypertension- blood pressure controlled, continue amlodipine, metoprolol  Chronic respiratory failure with hypoxia- Patient on 4 L of nasal cannula with oxygen at home continue as needed albuterol.  Dysuria-patient complained of dysuria.  Urine culture showed insignificant  growth.  History of stroke-continue aspirin, Lipitor.  Anxiety-continue as needed Klonopin  Chronic combined systolic and diastolic CHF-well compensated.  Lasix is on hold. Resume as needed   DVT prophylaxis: Lovenox  Code Status: Full code  Family Communication: No family bedside  Disposition Plan: Likely home next week   Consultants:  Gastroenterology  surgery  Procedures: -Colectomy    Antibiotics: Anti-infectives (From admission, onward)   Start     Dose/Rate Route Frequency Ordered Stop   06/11/17 1600  polymyxin B 500,000 Units, bacitracin 50,000 Units in sodium chloride 0.9 % 500 mL irrigation  Status:  Discontinued       As needed 06/11/17 1600 06/11/17 1647   06/11/17 1340  cefoTEtan in Dextrose 5% (CEFOTAN) 2-2.08 GM-%(50ML) IVPB    Comments:  Algis Liming   : cabinet override      06/11/17 1340 06/12/17 0144   06/11/17 1323  ceFAZolin (ANCEF) 2-4 GM/100ML-% IVPB  Status:  Discontinued    Comments:  Jones, Sherrie   : cabinet override      06/11/17 1323 06/11/17 1342   06/11/17 0600  cefoTEtan (CEFOTAN) 2 g in dextrose 5 % 50 mL IVPB     2 g 100 mL/hr over 30 Minutes Intravenous On call to O.R. 06/10/17 1255 06/11/17 1511   06/11/17 0600  clindamycin (CLEOCIN) 900 mg, gentamicin (GARAMYCIN) 240 mg in sodium chloride 0.9 % 1,000 mL for intraperitoneal lavage  Status:  Discontinued      Intraperitoneal To Surgery 06/10/17 1255 06/11/17 1739   06/10/17 1600  neomycin (MYCIFRADIN) tablet 1,000 mg     1,000 mg Oral 3 times daily 06/10/17 1255 06/11/17 1559   06/08/17 1000  ertapenem (INVANZ) 1 g in sodium chloride 0.9 % 50 mL IVPB  Status:  Discontinued     1 g  100 mL/hr over 30 Minutes Intravenous Every 24 hours 06/08/17 0930 06/12/17 0735   06/03/17 1200  ciprofloxacin (CIPRO) IVPB 400 mg  Status:  Discontinued     400 mg 200 mL/hr over 60 Minutes Intravenous Every 12 hours 06/03/17 0207 06/08/17 0930   06/03/17 0445  metroNIDAZOLE (FLAGYL) IVPB  500 mg  Status:  Discontinued     500 mg 100 mL/hr over 60 Minutes Intravenous Every 8 hours 06/03/17 0200 06/08/17 0930   06/03/17 0100  ciprofloxacin (CIPRO) IVPB 400 mg     400 mg 200 mL/hr over 60 Minutes Intravenous  Once 06/03/17 0055 06/03/17 0333   06/03/17 0100  metroNIDAZOLE (FLAGYL) IVPB 500 mg  Status:  Discontinued     500 mg 100 mL/hr over 60 Minutes Intravenous  Once 06/03/17 0055 06/03/17 0200       (indicate start date, and stop date if known)  HPI/Subjective: Alert. No flatus yet. Reports post  Pain. No vomiting but feels nauseated   Objective: Vitals:   06/13/17 0632 06/13/17 0758  BP: 127/74   Pulse: 80   Resp: 16 14  Temp: 97.7 F (36.5 C)   SpO2: 100% 96%    Intake/Output Summary (Last 24 hours) at 06/13/2017 0858 Last data filed at 06/13/2017 8299 Gross per 24 hour  Intake 1400 ml  Output 2150 ml  Net -750 ml   Filed Weights   06/11/17 0543 06/12/17 0610 06/13/17 3716  Weight: 100.7 kg (222 lb 1.6 oz) 100.2 kg (220 lb 14.4 oz) 99.5 kg (219 lb 5.7 oz)    Exam:   General:  No distress   Cardiovascular: s1,s2 rrr  Respiratory: CTA BL  Abdomen: soft, post op tender   Musculoskeletal: no leg edema    Data Reviewed: Basic Metabolic Panel: Recent Labs  Lab 06/08/17 0424 06/12/17 0439  NA 140 138  K 3.8 5.0  CL 109 106  CO2 26 26  GLUCOSE 103* 172*  BUN 9 10  CREATININE 0.89 0.75  CALCIUM 8.5* 8.4*   Liver Function Tests: Recent Labs  Lab 06/12/17 0439  AST 20  ALT 14  ALKPHOS 51  BILITOT 0.7  PROT 6.9  ALBUMIN 3.0*   No results for input(s): LIPASE, AMYLASE in the last 168 hours. No results for input(s): AMMONIA in the last 168 hours. CBC: Recent Labs  Lab 06/08/17 0424 06/12/17 0439  WBC 7.2 9.5  HGB 11.8* 11.8*  HCT 36.9 36.6  MCV 100.8* 100.0  PLT 335 317   Cardiac Enzymes: No results for input(s): CKTOTAL, CKMB, CKMBINDEX, TROPONINI in the last 168 hours. BNP (last 3 results) Recent Labs     01/11/17 1106 06/03/17 0550  BNP 10.4 17.5    ProBNP (last 3 results) No results for input(s): PROBNP in the last 8760 hours.  CBG: No results for input(s): GLUCAP in the last 168 hours.  Recent Results (from the past 240 hour(s))  MRSA PCR Screening     Status: None   Collection Time: 06/11/17  5:14 AM  Result Value Ref Range Status   MRSA by PCR NEGATIVE NEGATIVE Final    Comment:        The GeneXpert MRSA Assay (FDA approved for NASAL specimens only), is one component of a comprehensive MRSA colonization surveillance program. It is not intended to diagnose MRSA infection nor to guide or monitor treatment for MRSA infections.      Studies: No results found.  Scheduled Meds: . alvimopan  12 mg Oral BID  .  amLODipine  10 mg Oral Daily  . aspirin EC  81 mg Oral Daily  . atorvastatin  10 mg Oral Daily  . chlorhexidine  15 mL Mouth Rinse BID  . dicyclomine  20 mg Oral TID AC & HS  . enoxaparin (LOVENOX) injection  40 mg Subcutaneous Q24H  . feeding supplement (ENSURE ENLIVE)  237 mL Oral BID BM  . fluticasone  2 spray Each Nare Daily  . HYDROmorphone   Intravenous Q4H  . mouth rinse  15 mL Mouth Rinse q12n4p  . metoprolol succinate  50 mg Oral QHS  . mometasone-formoterol  2 puff Inhalation BID  . pantoprazole  40 mg Oral Daily  . saccharomyces boulardii  250 mg Oral BID  . simethicone  80 mg Oral QID  . topiramate  50 mg Oral BID   Continuous Infusions: . 0.9 % sodium chloride with kcl 50 mL/hr at 06/12/17 1829    Principal Problem:   Diverticulitis of colon Active Problems:   Essential hypertension   OSA (obstructive sleep apnea)   Chronic respiratory failure with hypoxia (HCC)   Morbid (severe) obesity due to excess calories (HCC)   HLD (hyperlipidemia)   Stroke (cerebrum) (HCC)   GERD (gastroesophageal reflux disease)   Anxiety   Chronic combined systolic (congestive) and diastolic (congestive) heart failure (HCC)   Hypokalemia   Diverticulitis  large intestine   Non-intractable vomiting with nausea   Lower abdominal pain   Abnormal CT scan, colon    Time spent: >365 minutes     Kinnie Feil  [facility] Pager [phone]. If 7PM-7AM, please contact night-coverage at [email or web address], password [facility] 06/13/2017, 8:58 AM  LOS: 10 days

## 2017-06-13 NOTE — Progress Notes (Signed)
Patient continues to decline nocturnal CPAP.  

## 2017-06-14 NOTE — Progress Notes (Signed)
Patient continues to decline nocturnal CPAP. Order changed to prn per RT protocol.  

## 2017-06-14 NOTE — Progress Notes (Signed)
Assessment Principal Problem:   Medically refractory diverticulitis of colon s/p open sigmoid colectomy 06/11/17 (Dr. Cleta Alberts ileus continues Active Problems:   Essential hypertension   OSA (obstructive sleep apnea)   Morbid (severe) obesity due to excess calories ([place])     Plan:  Keep on liquids until bowel function returns.   LOS: 11 days     3 Days Post-Op  Chief Complaint/Subjective: Still with some nausea.  No flatus or BM. Pain is better  Objective: Vital signs in last 24 hours: Temp:  [98.2 F (36.8 C)-98.4 F (36.9 C)] 98.3 F (36.8 C) (12/09 0443) Pulse Rate:  [67-84] 81 (12/09 0443) Resp:  [10-21] 19 (12/09 0800) BP: (100-134)/(55-76) 100/55 (12/09 0919) SpO2:  [95 %-100 %] 96 % (12/09 0800) Last BM Date: 06/11/17  Intake/Output from previous day: 12/08 0701 - 12/09 0700 In: 2205 [P.O.:840; I.V.:1200; IV Piggyback:165] Out: 550 [Urine:550] Intake/Output this shift: No intake/output data recorded.  PE: General- In NAD.  Awake and alert. Abdomen-soft, quiet, wound VAC on, hypoactive bowel sounds  Lab Results:  Recent Labs    06/12/17 0439  WBC 9.5  HGB 11.8*  HCT 36.6  PLT 317   BMET Recent Labs    06/12/17 0439  NA 138  K 5.0  CL 106  CO2 26  GLUCOSE 172*  BUN 10  CREATININE 0.75  CALCIUM 8.4*   PT/INR No results for input(s): LABPROT, INR in the last 72 hours. Comprehensive Metabolic Panel:    Component Value Date/Time   NA 138 06/12/2017 0439   NA 140 06/08/2017 0424   K 5.0 06/12/2017 0439   K 3.8 06/08/2017 0424   CL 106 06/12/2017 0439   CL 109 06/08/2017 0424   CO2 26 06/12/2017 0439   CO2 26 06/08/2017 0424   BUN 10 06/12/2017 0439   BUN 9 06/08/2017 0424   CREATININE 0.75 06/12/2017 0439   CREATININE 0.89 06/08/2017 0424   GLUCOSE 172 (H) 06/12/2017 0439   GLUCOSE 103 (H) 06/08/2017 0424   CALCIUM 8.4 (L) 06/12/2017 0439   CALCIUM 8.5 (L) 06/08/2017 0424   AST 20 06/12/2017 0439   AST 17 06/02/2017 2300   ALT  14 06/12/2017 0439   ALT 16 06/02/2017 2300   ALKPHOS 51 06/12/2017 0439   ALKPHOS 62 06/02/2017 2300   BILITOT 0.7 06/12/2017 0439   BILITOT 0.4 06/02/2017 2300   PROT 6.9 06/12/2017 0439   PROT 7.5 06/02/2017 2300   ALBUMIN 3.0 (L) 06/12/2017 0439   ALBUMIN 3.5 06/02/2017 2300     Studies/Results: No results found.  Anti-infectives: Anti-infectives (From admission, onward)   Start     Dose/Rate Route Frequency Ordered Stop   06/11/17 1600  polymyxin B 500,000 Units, bacitracin 50,000 Units in sodium chloride 0.9 % 500 mL irrigation  Status:  Discontinued       As needed 06/11/17 1600 06/11/17 1647   06/11/17 1340  cefoTEtan in Dextrose 5% (CEFOTAN) 2-2.08 GM-%(50ML) IVPB    Comments:  Algis Liming   : cabinet override      06/11/17 1340 06/12/17 0144   06/11/17 1323  ceFAZolin (ANCEF) 2-4 GM/100ML-% IVPB  Status:  Discontinued    Comments:  Jones, Sherrie   : cabinet override      06/11/17 1323 06/11/17 1342   06/11/17 0600  cefoTEtan (CEFOTAN) 2 g in dextrose 5 % 50 mL IVPB     2 g 100 mL/hr over 30 Minutes Intravenous On call to O.R. 06/10/17 1255 06/11/17 1511  06/11/17 0600  clindamycin (CLEOCIN) 900 mg, gentamicin (GARAMYCIN) 240 mg in sodium chloride 0.9 % 1,000 mL for intraperitoneal lavage  Status:  Discontinued      Intraperitoneal To Surgery 06/10/17 1255 06/11/17 1739   06/10/17 1600  neomycin (MYCIFRADIN) tablet 1,000 mg     1,000 mg Oral 3 times daily 06/10/17 1255 06/11/17 1559   06/08/17 1000  ertapenem (INVANZ) 1 g in sodium chloride 0.9 % 50 mL IVPB  Status:  Discontinued     1 g 100 mL/hr over 30 Minutes Intravenous Every 24 hours 06/08/17 0930 06/12/17 0735   06/03/17 1200  ciprofloxacin (CIPRO) IVPB 400 mg  Status:  Discontinued     400 mg 200 mL/hr over 60 Minutes Intravenous Every 12 hours 06/03/17 0207 06/08/17 0930   06/03/17 0445  metroNIDAZOLE (FLAGYL) IVPB 500 mg  Status:  Discontinued     500 mg 100 mL/hr over 60 Minutes Intravenous Every 8  hours 06/03/17 0200 06/08/17 0930   06/03/17 0100  ciprofloxacin (CIPRO) IVPB 400 mg     400 mg 200 mL/hr over 60 Minutes Intravenous  Once 06/03/17 0055 06/03/17 0333   06/03/17 0100  metroNIDAZOLE (FLAGYL) IVPB 500 mg  Status:  Discontinued     500 mg 100 mL/hr over 60 Minutes Intravenous  Once 06/03/17 0055 06/03/17 0200       Ann [name] 06/14/2017

## 2017-06-14 NOTE — Progress Notes (Signed)
[facility] PROGRESS NOTE  Ann [name] IOE:[id number] DOB: 18-Oct-1955 DOA: 06/02/2017 PCP: Berkley Harvey, NP  Brief summary   61 y.o.femalewith medical history significant ofrecurrent diverticulitis, hypertension, hyperlipidemia, stroke, TIA, GERD, anxiety, OSA on CPAP, obesity, CHF with EF of 45-50 percent, chronic respiratory failure on 4 L oxygen at home, who presents with nausea, vomiting, diarrhea and lower abdominal pain. Patient was just discharged from the hospital on 05/28/2017.She was supposed to follow-up surgery for possible hemicolectomy as outpatient.  Patient came back with abdominal pain was found to have sigmoid diverticulitis.  Has had multiple rounds of antibiotics and has been seen by GI and surgery in the hospital.  At this time as patient is not improved with IV antibiotics and underwent hemicolectomy on 12/6   Assessment/Plan:  Recurrent diverticulitis/colitis-CT scan shows sigmoid colon with colitis and possible colitis and splenic flexure and descending colon.  No perforation or abscess.  GI was consulted.  Treated  With antibiotics: cipro/Flagyl  Then  Invanzcipro/flagyl 11/28>>12/3, invanz 12/3>>. General surgery consulted. Repeat   CT scan shows persistent sigmoid diverticulitis despite being on antibiotics.  - Underwent  Hemicolectomy on 12/6, post op day #3 - keep on clear liquids for now  - appreciate surgery team assistance   Question right hydronephrosis -CT scan shows distended right renal pelvis and inflammation around distal right ureter - per urology, no need for stenting at this time - monitor renal function   Mild fullness right collecting system likely from pelvic inflammation,No need for stent as per Dr Matilde Sprang  F/up with Dr Jeffie Pollock electively  Hypertension - cont Metoprolol and Norvasc   Chronic respiratory failure with hypoxia- Patient on 4 L of nasal cannula with oxygen at home - continue bronchodilators as needed    Dysuria-patient complained of dysuria.   - urine cultures with insignificant growth   History of stroke-continue aspirin, Lipitor.  Anxiety-continue as needed Klonopin  Chronic combined systolic and diastolic CHF- - compensated - monitor daily weights, I/O  DVT prophylaxis: Lovenox  Code Status: Full code  Family Communication: pt updated at bedside   Disposition Plan: home when surgery team clears    Consultants:  Gastroenterology  surgery  Procedures: -Colectomy    Antibiotics: Anti-infectives (From admission, onward)   Start     Dose/Rate Route Frequency Ordered Stop   06/11/17 1600  polymyxin B 500,000 Units, bacitracin 50,000 Units in sodium chloride 0.9 % 500 mL irrigation  Status:  Discontinued       As needed 06/11/17 1600 06/11/17 1647   06/11/17 1340  cefoTEtan in Dextrose 5% (CEFOTAN) 2-2.08 GM-%(50ML) IVPB    Comments:  Algis Liming   : cabinet override      06/11/17 1340 06/12/17 0144   06/11/17 1323  ceFAZolin (ANCEF) 2-4 GM/100ML-% IVPB  Status:  Discontinued    Comments:  Jones, Sherrie   : cabinet override      06/11/17 1323 06/11/17 1342   06/11/17 0600  cefoTEtan (CEFOTAN) 2 g in dextrose 5 % 50 mL IVPB     2 g 100 mL/hr over 30 Minutes Intravenous On call to O.R. 06/10/17 1255 06/11/17 1511   06/11/17 0600  clindamycin (CLEOCIN) 900 mg, gentamicin (GARAMYCIN) 240 mg in sodium chloride 0.9 % 1,000 mL for intraperitoneal lavage  Status:  Discontinued      Intraperitoneal To Surgery 06/10/17 1255 06/11/17 1739   06/10/17 1600  neomycin (MYCIFRADIN) tablet 1,000 mg     1,000 mg Oral 3 times daily 06/10/17 1255  06/11/17 1559   06/08/17 1000  ertapenem (INVANZ) 1 g in sodium chloride 0.9 % 50 mL IVPB  Status:  Discontinued     1 g 100 mL/hr over 30 Minutes Intravenous Every 24 hours 06/08/17 0930 06/12/17 0735   06/03/17 1200  ciprofloxacin (CIPRO) IVPB 400 mg  Status:  Discontinued     400 mg 200 mL/hr over 60 Minutes Intravenous  Every 12 hours 06/03/17 0207 06/08/17 0930   06/03/17 0445  metroNIDAZOLE (FLAGYL) IVPB 500 mg  Status:  Discontinued     500 mg 100 mL/hr over 60 Minutes Intravenous Every 8 hours 06/03/17 0200 06/08/17 0930   06/03/17 0100  ciprofloxacin (CIPRO) IVPB 400 mg     400 mg 200 mL/hr over 60 Minutes Intravenous  Once 06/03/17 0055 06/03/17 0333   06/03/17 0100  metroNIDAZOLE (FLAGYL) IVPB 500 mg  Status:  Discontinued     500 mg 100 mL/hr over 60 Minutes Intravenous  Once 06/03/17 0055 06/03/17 0200      HPI/Subjective: Pt reports feeling better, did not have BM yet.   Objective: Vitals:   06/14/17 1141 06/14/17 1155  BP:  102/62  Pulse:  62  Resp: 14 12  Temp:  98.2 F (36.8 C)  SpO2: 98% 98%    Intake/Output Summary (Last 24 hours) at 06/14/2017 1619 Last data filed at 06/14/2017 1200 Gross per 24 hour  Intake 2320 ml  Output 1450 ml  Net 870 ml   Filed Weights   06/11/17 0543 06/12/17 0610 06/13/17 7169  Weight: 100.7 kg (222 lb 1.6 oz) 100.2 kg (220 lb 14.4 oz) 99.5 kg (219 lb 5.7 oz)   Physical Exam  Constitutional: Appears calm, NAD CVS: RRR, S1/S2 +, no murmurs, no gallops, no carotid bruit.  Pulmonary: Effort and breath sounds normal, no stridor, rhonchi, wheezes, rales.  Abdominal: Soft, no distension, tenderness, rebound or guarding.  Musculoskeletal: Normal range of motion. No edema and no tenderness.   Data Reviewed: Basic Metabolic Panel: Recent Labs  Lab 06/08/17 0424 06/12/17 0439  NA 140 138  K 3.8 5.0  CL 109 106  CO2 26 26  GLUCOSE 103* 172*  BUN 9 10  CREATININE 0.89 0.75  CALCIUM 8.5* 8.4*   Liver Function Tests: Recent Labs  Lab 06/12/17 0439  AST 20  ALT 14  ALKPHOS 51  BILITOT 0.7  PROT 6.9  ALBUMIN 3.0*   CBC: Recent Labs  Lab 06/08/17 0424 06/12/17 0439  WBC 7.2 9.5  HGB 11.8* 11.8*  HCT 36.9 36.6  MCV 100.8* 100.0  PLT 335 317   BNP (last 3 results) Recent Labs    01/11/17 1106 06/03/17 0550  BNP 10.4 17.5    Recent Results (from the past 240 hour(s))  MRSA PCR Screening     Status: None   Collection Time: 06/11/17  5:14 AM  Result Value Ref Range Status   MRSA by PCR NEGATIVE NEGATIVE Final    Comment:        The GeneXpert MRSA Assay (FDA approved for NASAL specimens only), is one component of a comprehensive MRSA colonization surveillance program. It is not intended to diagnose MRSA infection nor to guide or monitor treatment for MRSA infections.      Studies: No results found.  Scheduled Meds: . alvimopan  12 mg Oral BID  . amLODipine  10 mg Oral Daily  . aspirin EC  81 mg Oral Daily  . atorvastatin  10 mg Oral Daily  . chlorhexidine  15 mL  Mouth Rinse BID  . dicyclomine  20 mg Oral TID AC & HS  . enoxaparin (LOVENOX) injection  40 mg Subcutaneous Q24H  . feeding supplement (ENSURE ENLIVE)  237 mL Oral BID BM  . fluticasone  2 spray Each Nare Daily  . HYDROmorphone   Intravenous Q4H  . mouth rinse  15 mL Mouth Rinse q12n4p  . metoprolol succinate  50 mg Oral QHS  . mometasone-formoterol  2 puff Inhalation BID  . pantoprazole  40 mg Oral Daily  . saccharomyces boulardii  250 mg Oral BID  . simethicone  80 mg Oral QID  . topiramate  50 mg Oral BID   Continuous Infusions: . methocarbamol (ROBAXIN)  IV Stopped (06/14/17 1120)  . 0.9 % sodium chloride with kcl 50 mL/hr at 06/13/17 1827    Time spent: 25 minutes   [facility] Pager [phone]. If 7PM-7AM, please contact night-coverage at [email or web address], password [facility] 06/14/2017, 4:19 PM  LOS: 11 days

## 2017-06-15 LAB — BASIC METABOLIC PANEL
ANION GAP: 5 (ref 5–15)
BUN: <5 mg/dL — ABNORMAL LOW (ref 6–20)
CHLORIDE: 109 mmol/L (ref 101–111)
CO2: 27 mmol/L (ref 22–32)
CREATININE: 0.67 mg/dL (ref 0.44–1.00)
Calcium: 8.3 mg/dL — ABNORMAL LOW (ref 8.9–10.3)
GFR calc non Af Amer: >60 mL/min (ref >60)
Glucose, Bld: 94 mg/dL (ref 65–99)
POTASSIUM: 4.1 mmol/L (ref 3.5–5.1)
SODIUM: 141 mmol/L (ref 135–145)

## 2017-06-15 LAB — CBC
HEMATOCRIT: 25.9 % — AB (ref 36.0–46.0)
HEMOGLOBIN: 8.4 g/dL — AB (ref 12.0–15.0)
MCH: 33.2 pg (ref 26.0–34.0)
MCHC: 32.4 g/dL (ref 30.0–36.0)
MCV: 102.4 fL — ABNORMAL HIGH (ref 78.0–100.0)
Platelets: 250 10*3/uL (ref 150–400)
RBC: 2.53 MIL/uL — AB (ref 3.87–5.11)
RDW: 14.4 % (ref 11.5–15.5)
WBC: 5.1 10*3/uL (ref 4.0–10.5)

## 2017-06-15 MED ORDER — OXYCODONE HCL 5 MG PO TABS
5.0000 mg | ORAL_TABLET | ORAL | Status: DC | PRN
  Administered 2017-06-16 – 2017-06-17 (×4): 5 mg via ORAL
  Filled 2017-06-15 (×2): qty 1
  Filled 2017-06-15: qty 2
  Filled 2017-06-15: qty 1

## 2017-06-15 MED ORDER — ACETAMINOPHEN 500 MG PO TABS
1000.0000 mg | ORAL_TABLET | Freq: Three times a day (TID) | ORAL | Status: DC
  Administered 2017-06-15 – 2017-06-17 (×7): 1000 mg via ORAL
  Filled 2017-06-15 (×7): qty 2

## 2017-06-15 MED ORDER — TRAMADOL HCL 50 MG PO TABS
50.0000 mg | ORAL_TABLET | Freq: Four times a day (QID) | ORAL | Status: DC | PRN
  Administered 2017-06-15: 50 mg via ORAL
  Administered 2017-06-15: 100 mg via ORAL
  Administered 2017-06-16: 50 mg via ORAL
  Filled 2017-06-15 (×2): qty 1
  Filled 2017-06-15: qty 2

## 2017-06-15 MED ORDER — HYDROMORPHONE HCL 1 MG/ML IJ SOLN: 0.5000 mg | INTRAMUSCULAR | Status: DC | PRN

## 2017-06-15 NOTE — Progress Notes (Signed)
4 Days Post-Op    CC: Abdominal pain  Subjective: No flatus so far.  She does have some bowel sounds.  She has severe osteoarthritis so she is not ambulating at all outside of the room.  Wound VAC over the incision.  Objective: Vital signs in last 24 hours: Temp:  [98.1 F (36.7 C)-99 F (37.2 C)] 98.5 F (36.9 C) (12/10 0605) Pulse Rate:  [57-67] 62 (12/10 0605) Resp:  [12-18] 18 (12/10 0759) BP: (98-122)/(52-68) 100/57 (12/10 0605) SpO2:  [96 %-100 %] 96 % (12/10 0759) Weight:  [99.8 kg (220 lb)] 99.8 kg (220 lb) (12/10 0606) Last BM Date: 06/11/17 120 p.o. 1400 IV Urine 2400 Afebrile vital signs are stable BMP okay Hemoglobin/hematocrit down to 8.4/25.9 preop hemoglobin it was running between 11 and 12 Intake/Output from previous day: 12/09 0701 - 12/10 0700 In: 8299 [P.O.:120; I.V.:1200; IV Piggyback:165] Out: 2400 [Urine:2400] Intake/Output this shift: No intake/output data recorded.  General appearance: alert, cooperative and no distress Resp: clear to auscultation bilaterally GI: Soft, sore, few bowel sounds, no flatus or BM.  Lab Results:  Recent Labs    06/15/17 0552  WBC 5.1  HGB 8.4*  HCT 25.9*  PLT 250    BMET Recent Labs    06/15/17 0552  NA 141  K 4.1  CL 109  CO2 27  GLUCOSE 94  BUN <5*  CREATININE 0.67  CALCIUM 8.3*   PT/INR No results for input(s): LABPROT, INR in the last 72 hours.  Recent Labs  Lab 06/12/17 0439  AST 20  ALT 14  ALKPHOS 51  BILITOT 0.7  PROT 6.9  ALBUMIN 3.0*     Lipase     Component Value Date/Time   LIPASE 23 06/02/2017 2300     Medications: . alvimopan  12 mg Oral BID  . amLODipine  10 mg Oral Daily  . aspirin EC  81 mg Oral Daily  . atorvastatin  10 mg Oral Daily  . chlorhexidine  15 mL Mouth Rinse BID  . dicyclomine  20 mg Oral TID AC & HS  . enoxaparin (LOVENOX) injection  40 mg Subcutaneous Q24H  . feeding supplement (ENSURE ENLIVE)  237 mL Oral BID BM  . fluticasone  2 spray Each Nare  Daily  . HYDROmorphone   Intravenous Q4H  . mouth rinse  15 mL Mouth Rinse q12n4p  . metoprolol succinate  50 mg Oral QHS  . mometasone-formoterol  2 puff Inhalation BID  . pantoprazole  40 mg Oral Daily  . saccharomyces boulardii  250 mg Oral BID  . simethicone  80 mg Oral QID  . topiramate  50 mg Oral BID   Scheduled Meds: . alvimopan  12 mg Oral BID  . amLODipine  10 mg Oral Daily  . aspirin EC  81 mg Oral Daily  . atorvastatin  10 mg Oral Daily  . chlorhexidine  15 mL Mouth Rinse BID  . dicyclomine  20 mg Oral TID AC & HS  . enoxaparin (LOVENOX) injection  40 mg Subcutaneous Q24H  . feeding supplement (ENSURE ENLIVE)  237 mL Oral BID BM  . fluticasone  2 spray Each Nare Daily  . HYDROmorphone   Intravenous Q4H  . mouth rinse  15 mL Mouth Rinse q12n4p  . metoprolol succinate  50 mg Oral QHS  . mometasone-formoterol  2 puff Inhalation BID  . pantoprazole  40 mg Oral Daily  . saccharomyces boulardii  250 mg Oral BID  . simethicone  80 mg Oral  QID  . topiramate  50 mg Oral BID   Continuous Infusions: . 0.9 % sodium chloride with kcl 50 mL/hr at 06/14/17 1810   Anti-infectives (From admission, onward)   Start     Dose/Rate Route Frequency Ordered Stop   06/11/17 1600  polymyxin B 500,000 Units, bacitracin 50,000 Units in sodium chloride 0.9 % 500 mL irrigation  Status:  Discontinued       As needed 06/11/17 1600 06/11/17 1647   06/11/17 1340  cefoTEtan in Dextrose 5% (CEFOTAN) 2-2.08 GM-%(50ML) IVPB    Comments:  Algis Liming   : cabinet override      06/11/17 1340 06/12/17 0144   06/11/17 1323  ceFAZolin (ANCEF) 2-4 GM/100ML-% IVPB  Status:  Discontinued    Comments:  Jones, Sherrie   : cabinet override      06/11/17 1323 06/11/17 1342   06/11/17 0600  cefoTEtan (CEFOTAN) 2 g in dextrose 5 % 50 mL IVPB     2 g 100 mL/hr over 30 Minutes Intravenous On call to O.R. 06/10/17 1255 06/11/17 1511   06/11/17 0600  clindamycin (CLEOCIN) 900 mg, gentamicin (GARAMYCIN) 240 mg in  sodium chloride 0.9 % 1,000 mL for intraperitoneal lavage  Status:  Discontinued      Intraperitoneal To Surgery 06/10/17 1255 06/11/17 1739   06/10/17 1600  neomycin (MYCIFRADIN) tablet 1,000 mg     1,000 mg Oral 3 times daily 06/10/17 1255 06/11/17 1559   06/08/17 1000  ertapenem (INVANZ) 1 g in sodium chloride 0.9 % 50 mL IVPB  Status:  Discontinued     1 g 100 mL/hr over 30 Minutes Intravenous Every 24 hours 06/08/17 0930 06/12/17 0735   06/03/17 1200  ciprofloxacin (CIPRO) IVPB 400 mg  Status:  Discontinued     400 mg 200 mL/hr over 60 Minutes Intravenous Every 12 hours 06/03/17 0207 06/08/17 0930   06/03/17 0445  metroNIDAZOLE (FLAGYL) IVPB 500 mg  Status:  Discontinued     500 mg 100 mL/hr over 60 Minutes Intravenous Every 8 hours 06/03/17 0200 06/08/17 0930   06/03/17 0100  ciprofloxacin (CIPRO) IVPB 400 mg     400 mg 200 mL/hr over 60 Minutes Intravenous  Once 06/03/17 0055 06/03/17 0333   06/03/17 0100  metroNIDAZOLE (FLAGYL) IVPB 500 mg  Status:  Discontinued     500 mg 100 mL/hr over 60 Minutes Intravenous  Once 06/03/17 0055 06/03/17 0200      Assessment/Plan Diverticulitis vs colitis - last CT 11/28 showed focal wall thickening with moderate surrounding inflammation at the sigmoid colon, diverticulitis vs colitis at the splenic flexure and descending colon possibly 2/2 infectious or inflammatory bowel disease - last colonoscopy 08/26/2016 Dr. Havery Moros showed diverticulosis in the transverse colon and in the left colon S/p open sigmoid colectomy with primary anastomosis, 06/11/17 Dr. [name], POD 4   HTN H/o CVA GERD OSA Chronic repsiratory failure - on O2 at home Morbid obesity Anxiety Combined systolic and diastolic CHF  ID - cipro/flagyl 11/28>>12/3, invanz 12/3 -12/7 FEN - IVF, CLD VTE - SCDs, lovenox Follow up - Dr. Ninfa [name]   Plan: I told her to keep her on clear liquids until she told me she was officially passing gas.  I will recheck her labs  again in the a.m.  Ask PT to evaluate to help with some mobilization.  She is on a PCA I am going to stop that.  Place her on Tylenol 1 g every 8 PRN oxycodone, Tramadol,  IV dilaudid PRN.  LOS: 12 days    Datron Brakebill 06/15/2017 [phone]

## 2017-06-15 NOTE — Progress Notes (Signed)
[facility] PROGRESS NOTE  CNIYAH SPROULL DJT:[id number] DOB: [birth year] DOA: 06/02/2017 PCP: Berkley Harvey, NP  Brief summary   61 y.o.femalewith medical history significant ofrecurrent diverticulitis, hypertension, hyperlipidemia, stroke, TIA, GERD, anxiety, OSA on CPAP, obesity, CHF with EF of 45-50 percent, chronic respiratory failure on 4 L oxygen at home, who presents with nausea, vomiting, diarrhea and lower abdominal pain.  Patient was just discharged from the hospital on 05/28/2017.She was supposed to follow-up surgery for possible hemicolectomy as outpatient. Patient came back with abdominal pain was found to have sigmoid diverticulitis.  Has had multiple rounds of antibiotics and has been seen by GI and surgery in the hospital.  At this time as patient is not improved with IV antibiotics and underwent hemicolectomy on 12/6  Assessment/Plan:  Recurrent diverticulitis/colitis-CT scan shows sigmoid colon with colitis and possible colitis and splenic flexure and descending colon.  No perforation or abscess.  GI was consulted.  Treated  With antibiotics: cipro/Flagyl  Then  Invanzcipro/flagyl 11/28>>12/3, invanz 12/3>>. General surgery consulted. Repeat   CT scan shows persistent sigmoid diverticulitis despite being on antibiotics.  - Underwent  Hemicolectomy on 12/6, post op day #5 - appreciate surgery team assistance  - pt reports feeling better, had BM this AM  Question right hydronephrosis -CT scan shows distended right renal pelvis and inflammation around distal right ureter - per urology, no need for stenting at this time - renal function overall stable   Mild fullness right collecting system likely from pelvic inflammation,No need for stent as per Dr Matilde Sprang  - F/up with Dr Jeffie Pollock electively  Hypertension - cont Metoprolol and Norvasc  - reasonable inpatient control   Chronic respiratory failure with hypoxia- Patient on 4 L of nasal cannula with oxygen at  home - continue bronchodilators as needed   Dysuria-patient complained of dysuria.   - urine cultures with insignificant growth   History of stroke-continue aspirin, Lipitor.  Anxiety-continue as needed Klonopin  Chronic combined systolic and diastolic CHF- - compensated - monitor daily weights, I/O  DVT prophylaxis: Lovenox  Code Status: Full code  Family Communication: pt updated at bedside   Disposition Plan: home when surgery team clears    Consultants:  Gastroenterology  surgery  Procedures: -Colectomy   Antibiotics: Anti-infectives (From admission, onward)   Start     Dose/Rate Route Frequency Ordered Stop   06/11/17 1600  polymyxin B 500,000 Units, bacitracin 50,000 Units in sodium chloride 0.9 % 500 mL irrigation  Status:  Discontinued       As needed 06/11/17 1600 06/11/17 1647   06/11/17 1340  cefoTEtan in Dextrose 5% (CEFOTAN) 2-2.08 GM-%(50ML) IVPB    Comments:  Algis Liming   : cabinet override      06/11/17 1340 06/12/17 0144   06/11/17 1323  ceFAZolin (ANCEF) 2-4 GM/100ML-% IVPB  Status:  Discontinued    Comments:  Jones, Sherrie   : cabinet override      06/11/17 1323 06/11/17 1342   06/11/17 0600  cefoTEtan (CEFOTAN) 2 g in dextrose 5 % 50 mL IVPB     2 g 100 mL/hr over 30 Minutes Intravenous On call to O.R. 06/10/17 1255 06/11/17 1511   06/11/17 0600  clindamycin (CLEOCIN) 900 mg, gentamicin (GARAMYCIN) 240 mg in sodium chloride 0.9 % 1,000 mL for intraperitoneal lavage  Status:  Discontinued      Intraperitoneal To Surgery 06/10/17 1255 06/11/17 1739   06/10/17 1600  neomycin (MYCIFRADIN) tablet 1,000 mg     1,000 mg  Oral 3 times daily 06/10/17 1255 06/11/17 1559   06/08/17 1000  ertapenem (INVANZ) 1 g in sodium chloride 0.9 % 50 mL IVPB  Status:  Discontinued     1 g 100 mL/hr over 30 Minutes Intravenous Every 24 hours 06/08/17 0930 06/12/17 0735   06/03/17 1200  ciprofloxacin (CIPRO) IVPB 400 mg  Status:  Discontinued     400  mg 200 mL/hr over 60 Minutes Intravenous Every 12 hours 06/03/17 0207 06/08/17 0930   06/03/17 0445  metroNIDAZOLE (FLAGYL) IVPB 500 mg  Status:  Discontinued     500 mg 100 mL/hr over 60 Minutes Intravenous Every 8 hours 06/03/17 0200 06/08/17 0930   06/03/17 0100  ciprofloxacin (CIPRO) IVPB 400 mg     400 mg 200 mL/hr over 60 Minutes Intravenous  Once 06/03/17 0055 06/03/17 0333   06/03/17 0100  metroNIDAZOLE (FLAGYL) IVPB 500 mg  Status:  Discontinued     500 mg 100 mL/hr over 60 Minutes Intravenous  Once 06/03/17 0055 06/03/17 0200      HPI/Subjective: No events overnight.   Objective: Vitals:   06/15/17 0606 06/15/17 0759  BP:    Pulse:    Resp: 14 18  Temp:    SpO2: 98% 96%    Intake/Output Summary (Last 24 hours) at 06/15/2017 0930 Last data filed at 06/15/2017 3220 Gross per 24 hour  Intake 1485 ml  Output 2400 ml  Net -915 ml   Filed Weights   06/12/17 0610 06/13/17 0632 06/15/17 0606  Weight: 100.2 kg (220 lb 14.4 oz) 99.5 kg (219 lb 5.7 oz) 99.8 kg (220 lb)   Physical Exam  Constitutional: Appears calm, NAD CVS: RRR, S1/S2 +, no murmurs, no gallops, no carotid bruit.  Pulmonary: Effort and breath sounds normal, no stridor, rhonchi, wheezes, rales.  Abdominal: non tender, BS present   Data Reviewed: Basic Metabolic Panel: Recent Labs  Lab 06/12/17 0439 06/15/17 0552  NA 138 141  K 5.0 4.1  CL 106 109  CO2 26 27  GLUCOSE 172* 94  BUN 10 <5*  CREATININE 0.75 0.67  CALCIUM 8.4* 8.3*   Liver Function Tests: Recent Labs  Lab 06/12/17 0439  AST 20  ALT 14  ALKPHOS 51  BILITOT 0.7  PROT 6.9  ALBUMIN 3.0*   CBC: Recent Labs  Lab 06/12/17 0439 06/15/17 0552  WBC 9.5 5.1  HGB 11.8* 8.4*  HCT 36.6 25.9*  MCV 100.0 102.4*  PLT 317 250   BNP (last 3 results) Recent Labs    01/11/17 1106 06/03/17 0550  BNP 10.4 17.5   Recent Results (from the past 240 hour(s))  MRSA PCR Screening     Status: None   Collection Time: 06/11/17  5:14  AM  Result Value Ref Range Status   MRSA by PCR NEGATIVE NEGATIVE Final    Comment:        The GeneXpert MRSA Assay (FDA approved for NASAL specimens only), is one component of a comprehensive MRSA colonization surveillance program. It is not intended to diagnose MRSA infection nor to guide or monitor treatment for MRSA infections.      Studies: No results found.  Scheduled Meds: . alvimopan  12 mg Oral BID  . amLODipine  10 mg Oral Daily  . aspirin EC  81 mg Oral Daily  . atorvastatin  10 mg Oral Daily  . chlorhexidine  15 mL Mouth Rinse BID  . dicyclomine  20 mg Oral TID AC & HS  . enoxaparin (LOVENOX) injection  40 mg Subcutaneous Q24H  . feeding supplement (ENSURE ENLIVE)  237 mL Oral BID BM  . fluticasone  2 spray Each Nare Daily  . HYDROmorphone   Intravenous Q4H  . mouth rinse  15 mL Mouth Rinse q12n4p  . metoprolol succinate  50 mg Oral QHS  . mometasone-formoterol  2 puff Inhalation BID  . pantoprazole  40 mg Oral Daily  . saccharomyces boulardii  250 mg Oral BID  . simethicone  80 mg Oral QID  . topiramate  50 mg Oral BID   Continuous Infusions: . 0.9 % sodium chloride with kcl 50 mL/hr at 06/14/17 1810    Time spent: 25 minutes   [facility] Pager [phone]. If 7PM-7AM, please contact night-coverage at [email or web address], password [facility] 06/15/2017, 9:30 AM  LOS: 12 days

## 2017-06-15 NOTE — Progress Notes (Addendum)
Pt had 2 small semi soft dark BMs, first time post op.  Bowel sounds noted, c/o mild gas discomfort, enc patient to ambulate. SRP, RN

## 2017-06-15 NOTE — Progress Notes (Signed)
PT Cancellation Note  Patient Details Name: Ann [name] MRN: [id number] DOB: August 18, 1955   Cancelled Treatment:    Reason Eval/Treat Not Completed: PT screened, no needs identified, will sign off. Spoke with pt who denied need for PT services. Pt reported she has been walking without assistance. Will sign off at pt's request.    Weston Anna, MPT Pager: [phone]

## 2017-06-15 NOTE — Progress Notes (Signed)
Pharmacy Brief Note - Alvimopan (Entereg)  The standing order set for alvimopan (Entereg) now includes an automatic order to discontinue the drug after the patient has had a bowel movement.  The change was approved by the [place] and the Medical Executive Committee.    This patient has had a bowel movement documented by nursing.  Therefore, alvimopan has been discontinued.  If there are questions, please contact the pharmacy at [phone].  Thank you  Gretta Arab PharmD, BCPS Pager [phone] 06/15/2017 1:45 PM

## 2017-06-16 LAB — CBC
HEMATOCRIT: 28.1 % — AB (ref 36.0–46.0)
HEMOGLOBIN: 9.1 g/dL — AB (ref 12.0–15.0)
MCH: 33 pg (ref 26.0–34.0)
MCHC: 32.4 g/dL (ref 30.0–36.0)
MCV: 101.8 fL — AB (ref 78.0–100.0)
Platelets: 286 10*3/uL (ref 150–400)
RBC: 2.76 MIL/uL — ABNORMAL LOW (ref 3.87–5.11)
RDW: 14.1 % (ref 11.5–15.5)
WBC: 5 10*3/uL (ref 4.0–10.5)

## 2017-06-16 LAB — BASIC METABOLIC PANEL
ANION GAP: 5 (ref 5–15)
BUN: 6 mg/dL (ref 6–20)
CALCIUM: 8.6 mg/dL — AB (ref 8.9–10.3)
CHLORIDE: 110 mmol/L (ref 101–111)
CO2: 27 mmol/L (ref 22–32)
CREATININE: 0.7 mg/dL (ref 0.44–1.00)
GFR calc non Af Amer: >60 mL/min (ref >60)
GLUCOSE: 96 mg/dL (ref 65–99)
Potassium: 3.9 mmol/L (ref 3.5–5.1)
Sodium: 142 mmol/L (ref 135–145)

## 2017-06-16 MED ORDER — OXYCODONE HCL 5 MG PO TABS: 5.0000 mg | ORAL_TABLET | ORAL | 0 refills | Status: DC | PRN

## 2017-06-16 MED ORDER — ZOLPIDEM TARTRATE 5 MG PO TABS: 5.0000 mg | ORAL_TABLET | Freq: Every evening | ORAL | 0 refills | Status: DC | PRN

## 2017-06-16 NOTE — Progress Notes (Signed)
Pt s/p Open sigmoid Colectomy. Following pt for discharge needs. Plan to discharge in AM.

## 2017-06-16 NOTE — Plan of Care (Signed)
Pt still on CLD and tolerating well

## 2017-06-16 NOTE — Progress Notes (Signed)
[facility] PROGRESS NOTE  Ann [name] ENI:[id number] DOB: [birth year] DOA: 06/02/2017 PCP: Berkley Harvey, NP  Brief summary   61 y.o.femalewith medical history significant ofrecurrent diverticulitis, hypertension, hyperlipidemia, stroke, TIA, GERD, anxiety, OSA on CPAP, obesity, CHF with EF of 45-50 percent, chronic respiratory failure on 4 L oxygen at home, who presents with nausea, vomiting, diarrhea and lower abdominal pain.  Patient was just discharged from the hospital on 05/28/2017.She was supposed to follow-up surgery for possible hemicolectomy as outpatient. Patient came back with abdominal pain was found to have sigmoid diverticulitis.  Has had multiple rounds of antibiotics and has been seen by GI and surgery in the hospital.  At this time as patient is not improved with IV antibiotics and underwent hemicolectomy on 12/6  Assessment/Plan:  Recurrent diverticulitis/colitis-CT scan shows sigmoid colon with colitis and possible colitis and splenic flexure and descending colon.  No perforation or abscess.  GI was consulted.  Treated  With antibiotics: cipro/Flagyl  Then  Invanzcipro/flagyl 11/28>>12/3, invanz 12/3>>. General surgery consulted. Repeat   CT scan shows persistent sigmoid diverticulitis despite being on antibiotics.  - Underwent  Hemicolectomy on 12/6, post op day #6 - appreciate surgery team assistance  - pt reports feeling better, had BM again this AM - advanced diet to full liquids  - possible d/c in AM  Question right hydronephrosis -CT scan shows distended right renal pelvis and inflammation around distal right ureter - per urology, no need for stenting at this time - renal function stable  Mild fullness right collecting system likely from pelvic inflammation,No need for stent as per Dr Matilde Sprang  - F/up with Dr Jeffie Pollock electively  Hypertension - cont Metoprolol and Norvasc  - reasonable inpatient control   Chronic respiratory failure with  hypoxia- Patient on 4 L of nasal cannula with oxygen at home - continue bronchodilators as needed   Dysuria-patient complained of dysuria.   - urine cultures with insignificant growth   History of stroke-continue aspirin, Lipitor.  Anxiety-continue as needed Klonopin  Chronic combined systolic and diastolic CHF- - compensated - monitor daily weights, I/O  DVT prophylaxis: Lovenox  Code Status: Full code  Family Communication: pt updated at bedside   Disposition Plan: home in am    Consultants:  Gastroenterology  surgery  Procedures: -Colectomy   Antibiotics: Anti-infectives (From admission, onward)   Start     Dose/Rate Route Frequency Ordered Stop   06/11/17 1600  polymyxin B 500,000 Units, bacitracin 50,000 Units in sodium chloride 0.9 % 500 mL irrigation  Status:  Discontinued       As needed 06/11/17 1600 06/11/17 1647   06/11/17 1340  cefoTEtan in Dextrose 5% (CEFOTAN) 2-2.08 GM-%(50ML) IVPB    Comments:  Algis Liming   : cabinet override      06/11/17 1340 06/12/17 0144   06/11/17 1323  ceFAZolin (ANCEF) 2-4 GM/100ML-% IVPB  Status:  Discontinued    Comments:  Jones, Sherrie   : cabinet override      06/11/17 1323 06/11/17 1342   06/11/17 0600  cefoTEtan (CEFOTAN) 2 g in dextrose 5 % 50 mL IVPB     2 g 100 mL/hr over 30 Minutes Intravenous On call to O.R. 06/10/17 1255 06/11/17 1511   06/11/17 0600  clindamycin (CLEOCIN) 900 mg, gentamicin (GARAMYCIN) 240 mg in sodium chloride 0.9 % 1,000 mL for intraperitoneal lavage  Status:  Discontinued      Intraperitoneal To Surgery 06/10/17 1255 06/11/17 1739   06/10/17 1600  neomycin (MYCIFRADIN)  tablet 1,000 mg     1,000 mg Oral 3 times daily 06/10/17 1255 06/11/17 1559   06/08/17 1000  ertapenem (INVANZ) 1 g in sodium chloride 0.9 % 50 mL IVPB  Status:  Discontinued     1 g 100 mL/hr over 30 Minutes Intravenous Every 24 hours 06/08/17 0930 06/12/17 0735   06/03/17 1200  ciprofloxacin (CIPRO) IVPB 400  mg  Status:  Discontinued     400 mg 200 mL/hr over 60 Minutes Intravenous Every 12 hours 06/03/17 0207 06/08/17 0930   06/03/17 0445  metroNIDAZOLE (FLAGYL) IVPB 500 mg  Status:  Discontinued     500 mg 100 mL/hr over 60 Minutes Intravenous Every 8 hours 06/03/17 0200 06/08/17 0930   06/03/17 0100  ciprofloxacin (CIPRO) IVPB 400 mg     400 mg 200 mL/hr over 60 Minutes Intravenous  Once 06/03/17 0055 06/03/17 0333   06/03/17 0100  metroNIDAZOLE (FLAGYL) IVPB 500 mg  Status:  Discontinued     500 mg 100 mL/hr over 60 Minutes Intravenous  Once 06/03/17 0055 06/03/17 0200      HPI/Subjective: Pt reports feeling better.   Objective: Vitals:   06/15/17 2147 06/16/17 0428  BP: 117/63 104/67  Pulse: (!) 57 (!) 53  Resp: 16 16  Temp: 98.1 F (36.7 C) 97.7 F (36.5 C)  SpO2: 94% 100%    Intake/Output Summary (Last 24 hours) at 06/16/2017 1443 Last data filed at 06/16/2017 0600 Gross per 24 hour  Intake 1200 ml  Output 900 ml  Net 300 ml   Filed Weights   06/13/17 0632 06/15/17 0606 06/16/17 0641  Weight: 99.5 kg (219 lb 5.7 oz) 99.8 kg (220 lb) 99.3 kg (218 lb 14.7 oz)   Physical Exam  Constitutional: Appears well-developed and well-nourished. No distress.  CVS: RRR, S1/S2 +, no murmurs, no gallops, no carotid bruit.  Pulmonary: Effort and breath sounds normal, no stridor, rhonchi, wheezes, rales.  Abdominal: Soft. BS +,  no distension, tenderness, rebound or guarding.   Data Reviewed: Basic Metabolic Panel: Recent Labs  Lab 06/12/17 0439 06/15/17 0552 06/16/17 0437  NA 138 141 142  K 5.0 4.1 3.9  CL 106 109 110  CO2 26 27 27   GLUCOSE 172* 94 96  BUN 10 <5* 6  CREATININE 0.75 0.67 0.70  CALCIUM 8.4* 8.3* 8.6*   Liver Function Tests: Recent Labs  Lab 06/12/17 0439  AST 20  ALT 14  ALKPHOS 51  BILITOT 0.7  PROT 6.9  ALBUMIN 3.0*   CBC: Recent Labs  Lab 06/12/17 0439 06/15/17 0552 06/16/17 0437  WBC 9.5 5.1 5.0  HGB 11.8* 8.4* 9.1*  HCT 36.6 25.9*  28.1*  MCV 100.0 102.4* 101.8*  PLT 317 250 286   BNP (last 3 results) Recent Labs    01/11/17 1106 06/03/17 0550  BNP 10.4 17.5   Recent Results (from the past 240 hour(s))  MRSA PCR Screening     Status: None   Collection Time: 06/11/17  5:14 AM  Result Value Ref Range Status   MRSA by PCR NEGATIVE NEGATIVE Final    Comment:        The GeneXpert MRSA Assay (FDA approved for NASAL specimens only), is one component of a comprehensive MRSA colonization surveillance program. It is not intended to diagnose MRSA infection nor to guide or monitor treatment for MRSA infections.      Studies: No results found.  Scheduled Meds: . acetaminophen  1,000 mg Oral Q8H  . amLODipine  10  mg Oral Daily  . aspirin EC  81 mg Oral Daily  . atorvastatin  10 mg Oral Daily  . chlorhexidine  15 mL Mouth Rinse BID  . dicyclomine  20 mg Oral TID AC & HS  . enoxaparin (LOVENOX) injection  40 mg Subcutaneous Q24H  . feeding supplement (ENSURE ENLIVE)  237 mL Oral BID BM  . fluticasone  2 spray Each Nare Daily  . mouth rinse  15 mL Mouth Rinse q12n4p  . metoprolol succinate  50 mg Oral QHS  . mometasone-formoterol  2 puff Inhalation BID  . pantoprazole  40 mg Oral Daily  . saccharomyces boulardii  250 mg Oral BID  . simethicone  80 mg Oral QID  . topiramate  50 mg Oral BID   Continuous Infusions: . 0.9 % sodium chloride with kcl 50 mL/hr at 06/14/17 1810    Time spent: 25 minutes   [employer] [facility] Pager [phone]. If 7PM-7AM, please contact night-coverage at [email or web address], password [facility] 06/16/2017, 2:43 PM  LOS: 13 days

## 2017-06-16 NOTE — Progress Notes (Signed)
5 Days Post-Op    CC:  Abdominal pain  Subjective: Pt doing well with multiple BM's and flatus.  Prevena over the wound incision.  + BS, +BM, feels much better and is up walking some even with bad Knees.    Objective: Vital signs in last 24 hours: Temp:  [97.7 F (36.5 C)-98.8 F (37.1 C)] 97.7 F (36.5 C) (12/11 0428) Pulse Rate:  [53-58] 53 (12/11 0428) Resp:  [14-16] 16 (12/11 0428) BP: (103-117)/(63-70) 104/67 (12/11 0428) SpO2:  [94 %-100 %] 100 % (12/11 0428) Weight:  [99.3 kg (218 lb 14.7 oz)] 99.3 kg (218 lb 14.7 oz) (12/11 0641) Last BM Date: 06/11/17 1200 IV PO not recorded 1400 urine BM x 2 yesterday and one today Afebrile, VSS Labs are normal Intake/Output from previous day: 12/10 0701 - 12/11 0700 In: 1200 [I.V.:1200] Out: 1400 [Urine:1400] Intake/Output this shift: No intake/output data recorded.  General appearance: alert, cooperative and no distress Resp: clear to auscultation bilaterally GI: soft, non-tender; bowel sounds normal; no masses,  no organomegaly and incsional vac in place.   Lab Results:  Recent Labs    06/15/17 0552 06/16/17 0437  WBC 5.1 5.0  HGB 8.4* 9.1*  HCT 25.9* 28.1*  PLT 250 286    BMET Recent Labs    06/15/17 0552 06/16/17 0437  NA 141 142  K 4.1 3.9  CL 109 110  CO2 27 27  GLUCOSE 94 96  BUN <5* 6  CREATININE 0.67 0.70  CALCIUM 8.3* 8.6*   PT/INR No results for input(s): LABPROT, INR in the last 72 hours.  Recent Labs  Lab 06/12/17 0439  AST 20  ALT 14  ALKPHOS 51  BILITOT 0.7  PROT 6.9  ALBUMIN 3.0*     Lipase     Component Value Date/Time   LIPASE 23 06/02/2017 2300     Medications: . acetaminophen  1,000 mg Oral Q8H  . amLODipine  10 mg Oral Daily  . aspirin EC  81 mg Oral Daily  . atorvastatin  10 mg Oral Daily  . chlorhexidine  15 mL Mouth Rinse BID  . dicyclomine  20 mg Oral TID AC & HS  . enoxaparin (LOVENOX) injection  40 mg Subcutaneous Q24H  . feeding supplement (ENSURE ENLIVE)   237 mL Oral BID BM  . fluticasone  2 spray Each Nare Daily  . mouth rinse  15 mL Mouth Rinse q12n4p  . metoprolol succinate  50 mg Oral QHS  . mometasone-formoterol  2 puff Inhalation BID  . pantoprazole  40 mg Oral Daily  . saccharomyces boulardii  250 mg Oral BID  . simethicone  80 mg Oral QID  . topiramate  50 mg Oral BID   . 0.9 % sodium chloride with kcl 50 mL/hr at 06/14/17 1810    Assessment/Plan Sigmoid diverticulitis -last CT 11/28 showed focal wall thickening with moderate surrounding inflammation at the sigmoid colon, diverticulitisvs colitis at thesplenic flexure and descending colon possibly 2/2infectious or inflammatory bowel disease - last colonoscopy 08/26/2016 Dr. Havery Moros showed diverticulosis in the transverse colon and in the left colon S/p open sigmoid colectomy with primary anastomosis, 06/11/17 Dr. [name], POD 5   HTN H/o CVA GERD OSA Chronic repsiratory failure - on O2 at home Morbid obesity Anxiety Combined systolic and diastolic CHF  ID -cipro/flagyl 11/28>>12/3, invanz 12/3 -12/7 FEN -IVF, CLD VTE -SCDs, lovenox Follow up -Dr. Ninfa [name]   Plan:  Advance diet she may be able to go home tomorrow from our  standpoint.  Her husband is being taken to the hospital now.  He has end stage colon cancer and CHF.  Will make plans for DC after we talk with her tomorrow.      LOS: 13 days    Ann [name] 06/16/2017 [phone]

## 2017-06-17 LAB — BASIC METABOLIC PANEL
Anion gap: 8 (ref 5–15)
BUN: 8 mg/dL (ref 6–20)
CO2: 24 mmol/L (ref 22–32)
CREATININE: 0.75 mg/dL (ref 0.44–1.00)
Calcium: 8.5 mg/dL — ABNORMAL LOW (ref 8.9–10.3)
Chloride: 109 mmol/L (ref 101–111)
GFR calc Af Amer: >60 mL/min (ref >60)
GFR calc non Af Amer: >60 mL/min (ref >60)
Glucose, Bld: 91 mg/dL (ref 65–99)
POTASSIUM: 3.8 mmol/L (ref 3.5–5.1)
SODIUM: 141 mmol/L (ref 135–145)

## 2017-06-17 LAB — CBC
HEMATOCRIT: 30.2 % — AB (ref 36.0–46.0)
HEMOGLOBIN: 9.7 g/dL — AB (ref 12.0–15.0)
MCH: 32.6 pg (ref 26.0–34.0)
MCHC: 32.1 g/dL (ref 30.0–36.0)
MCV: 101.3 fL — AB (ref 78.0–100.0)
Platelets: 310 10*3/uL (ref 150–400)
RBC: 2.98 MIL/uL — AB (ref 3.87–5.11)
RDW: 14.1 % (ref 11.5–15.5)
WBC: 5.7 10*3/uL (ref 4.0–10.5)

## 2017-06-17 NOTE — Discharge Summary (Signed)
Physician Discharge Summary  Ann [name] BHA:[id number] DOB: 1955/07/12 DOA: 06/02/2017  PCP: Berkley Harvey, NP  Admit date: 06/02/2017 Discharge date: 06/17/2017  Recommendations for Outpatient Follow-up:  1. Pt will need to follow up with PCP in 2-3 weeks post discharge 2. Please obtain BMP to evaluate electrolytes and kidney function 3. Please also check CBC to evaluate Hg and Hct levels  Discharge Diagnoses:  Principal Problem:   Diverticulitis of colon Active Problems:   Essential hypertension   OSA (obstructive sleep apnea)   Chronic respiratory failure with hypoxia (HCC)   Morbid (severe) obesity due to excess calories (HCC)   HLD (hyperlipidemia)   Stroke (cerebrum) (HCC)   GERD (gastroesophageal reflux disease)   Anxiety   Chronic combined systolic (congestive) and diastolic (congestive) heart failure (HCC)   Hypokalemia   Diverticulitis large intestine   Non-intractable vomiting with nausea   Lower abdominal pain   Abnormal CT scan, colon  Discharge Condition: Stable  Diet recommendation: Heart healthy diet discussed in details   History of present illness:  61 y.o.femalewith medical history significant ofrecurrent diverticulitis, hypertension, hyperlipidemia, stroke, TIA, GERD, anxiety, OSA on CPAP, obesity, CHF with EF of 45-50 percent, chronic respiratory failure on 4 L oxygen at home, who presents with nausea, vomiting, diarrhea and lower abdominal pain.  Patient was just discharged from the hospital on 05/28/2017.She was supposed to follow-up surgery for possible hemicolectomy as outpatient. Patient came back with abdominal pain was found to have sigmoid diverticulitis. Has had multiple rounds of antibiotics and has been seen by GI and surgery in the hospital. At this time as patient is not improved with IV antibiotics and underwent hemicolectomy on 12/6  Assessment/Plan:  Recurrent diverticulitis/colitis-CT scan shows sigmoid colon with  colitis and possible colitis and splenic flexure and descending colon. No perforation or abscess. GI was consulted. Treated  With antibiotics: cipro/Flagyl  Then  Invanzcipro/flagyl 11/28>>12/3, invanz 12/3>>. General surgery consulted. Repeat  CT scan shows persistent sigmoid diverticulitis despite being on antibiotics.  - Underwent  Hemicolectomy on 12/6, post op day #7 - appreciate surgery team assistance  - diet was advanced to soft and pt tolerates well, wants to go home - surgery team ok with discharge   Question right hydronephrosis -CT scan shows distended right renal pelvis and inflammation around distal right ureter - per urology, no need for stenting at this time - renal function stable   Mild fullness right collecting system likely from pelvic inflammation,No need for stentas per Dr Matilde Sprang - F/up with Dr Jeffie Pollock electively  Hypertension - resume home medical regimen Norvasc, Lasix, Metoprolol   Chronic respiratory failure with hypoxia- Patient on 4 L of nasal cannula with oxygen at home - resp status stable this AM  Dysuria-patient complained of dysuria.  - urine cultures with insignificant growth   History of stroke-continue aspirin, Lipitor.  Anxiety-continue as needed Klonopin  Chronic combined systolic and diastolic CHF- - compensated   DVT prophylaxis:Lovenox  Code Status:Full code  Family Communication:pt updated at bedside   Disposition Plan:home    Consultants:  Gastroenterology  surgery  Procedures: -Colectomy     Procedures/Studies: Ct Abdomen Pelvis W Contrast  Result Date: 06/09/2017 CLINICAL DATA:  Nausea, vomiting, and diarrhea. Lower abdominal pain. History of diverticulitis. EXAM: CT ABDOMEN AND PELVIS WITH CONTRAST TECHNIQUE: Multidetector CT imaging of the abdomen and pelvis was performed using the standard protocol following bolus administration of intravenous contrast. CONTRAST:  159mL ISOVUE-300  IOPAMIDOL (ISOVUE-300) INJECTION 61% COMPARISON:  CT  abdomen pelvis dated June 03, 2017. FINDINGS: Lower chest: No acute abnormality. Hepatobiliary: No focal liver abnormality is seen. Status post cholecystectomy. No biliary dilatation. Pancreas: Unremarkable. No pancreatic ductal dilatation or surrounding inflammatory changes. Spleen: Normal in size without focal abnormality. Adrenals/Urinary Tract: The adrenal glands are unremarkable. Slightly increased prominence of the right renal collecting system when compared to prior study. Unchanged mild atrophy of the right kidney with stable subcentimeter low-density lesion, too small to characterize. Left kidney is unremarkable. The bladder is unremarkable. Stomach/Bowel: Persistent wall thickening and inflammatory stranding surrounding the sigmoid colon, similar to prior study. Previously described wall thickening at the splenic flexure and descending colon is no longer identified, and may have been related to underdistention. The stomach and small bowel are unremarkable. Normal appendix. Vascular/Lymphatic: Aortic atherosclerosis. No enlarged abdominal or pelvic lymph nodes. Reproductive: Uterus and bilateral adnexa are unremarkable. Other: No free fluid or pneumoperitoneum. No drainable fluid collection. Musculoskeletal: No acute or significant osseous findings. Degenerative changes of the lumbar spine. IMPRESSION: 1. Unchanged acute sigmoid diverticulitis. No perforation or drainable fluid collection. 2. Slightly increased prominence of the right renal collecting system when compared to prior study, without evidence of obstructing lesion. Findings could be related to pelvic inflammation surrounding the distal right ureter. 3.  Aortic atherosclerosis (ICD10-I70.0). Electronically Signed   By: Titus Dubin M.D.   On: 06/09/2017 19:48   Ct Abdomen Pelvis W Contrast  Result Date: 06/03/2017 CLINICAL DATA:  Abdominal pain EXAM: CT ABDOMEN AND PELVIS WITH  CONTRAST TECHNIQUE: Multidetector CT imaging of the abdomen and pelvis was performed using the standard protocol following bolus administration of intravenous contrast. CONTRAST:  100 mL Isovue-300 intravenous COMPARISON:  05/23/2017, 03/08/2017, 12/02/2016, 12/31/2015 FINDINGS: Lower chest: No acute abnormality. Hepatobiliary: No focal liver abnormality is seen. Status post cholecystectomy. No biliary dilatation. Pancreas: Unremarkable. No pancreatic ductal dilatation or surrounding inflammatory changes. Spleen: Normal in size without focal abnormality. Adrenals/Urinary Tract: Adrenal glands are within normal limits. Stable prominent right renal pelvis. Atrophic right kidney. subcentimeter cortical hypodensity in the right kidney too small to further characterize. Bladder normal Stomach/Bowel: Stomach is nonenlarged. No dilated small bowel. Wall thickening and surrounding inflammation at the sigmoid colon. This does not appear significantly changed. Colon wall thickening now appears to involve the splenic flexure and descending colon as well. Normal appendix Vascular/Lymphatic: Aortic atherosclerosis. No enlarged abdominal or pelvic lymph nodes. Reproductive: Uterus and bilateral adnexa are unremarkable. Other: Negative for free air or free fluid. Musculoskeletal: Degenerative changes. No acute or suspicious lesion IMPRESSION: 1. Focal wall thickening with moderate surrounding inflammation at the sigmoid colon, thought previously related to diverticulitis. There now appears to be mild colon wall thickening at the splenic flexure and descending colon, therefore consider colitis, secondary to infectious or inflammatory bowel disease. There is no perforation or drainable abscess. 2. Atrophic right kidney as before. Electronically Signed   By: Donavan Foil M.D.   On: 06/03/2017 01:11   Ct Abdomen Pelvis W Contrast  Result Date: 05/24/2017 CLINICAL DATA:  Abdominal pain.  Follow-up diverticulitis. EXAM: CT ABDOMEN  AND PELVIS WITH CONTRAST TECHNIQUE: Multidetector CT imaging of the abdomen and pelvis was performed using the standard protocol following bolus administration of intravenous contrast. CONTRAST:  <See Chart> ISOVUE-300 IOPAMIDOL (ISOVUE-300) INJECTION 61% COMPARISON:  05/18/2017 FINDINGS: Lower chest:  Unremarkable. Hepatobiliary: No focal abnormality within the liver parenchyma. Gallbladder surgically absent. No intrahepatic or extrahepatic biliary dilation. Pancreas: No focal mass lesion. No dilatation of the main duct. No intraparenchymal cyst. No  peripancreatic edema. Spleen: No splenomegaly. No focal mass lesion. Adrenals/Urinary Tract: No adrenal nodule or mass. 5 mm hypoenhancing lesion posterior interpolar right kidney similar to CT scan from 08/27/2015. Left kidney unremarkable No evidence for hydroureter. The urinary bladder appears normal for the degree of distention. Stomach/Bowel: Small to moderate hiatal hernia. Duodenum is normally positioned as is the ligament of Treitz. No small bowel wall thickening. No small bowel dilatation. The terminal ileum is normal. The appendix is normal. 10 cm segment of wall thickening noted mid sigmoid colon with pericolonic edema/inflammation. Overall imaging features are very similar to 05/18/2017 and similar but slightly less prominent findings were present on the 08/27/2015 exam. 15 mm focus of soft tissue attenuation adjacent to the colon (image 74 series 2) is similar to prior and could be a small fluid collection or inflammatory change. 2.5 cm area of more focal wall thickening is seen on image 73 of series 2. Vascular/Lymphatic: There is abdominal aortic atherosclerosis without aneurysm. There is no gastrohepatic or hepatoduodenal ligament lymphadenopathy. No intraperitoneal or retroperitoneal lymphadenopathy. No pelvic sidewall lymphadenopathy. Reproductive: The uterus has normal CT imaging appearance. There is no adnexal mass. Other: No intraperitoneal free  fluid. Musculoskeletal: Bone windows reveal no worrisome lytic or sclerotic osseous lesions. IMPRESSION: 1. Persistent wall thickening involving a 10 cm segment of sigmoid colon with pericolonic edema/inflammation similar to the study from 5 days ago. Similar but less prominent appearance noted on a study from 18 months ago. Imaging features are compatible with diverticulitis without perforation or abscess visible at this time. Patient underwent colonoscopy on 08/26/2016 with no mass lesion identified. Electronically Signed   By: Misty Stanley M.D.   On: 05/24/2017 09:07   Ct Abdomen Pelvis W Contrast  Result Date: 05/18/2017 CLINICAL DATA:  61 year old female with bilateral lower abdominal pain for 2 weeks. History of diverticulitis. Recently treated with Flagyl and Cipro. Pain is worse. Initial encounter. EXAM: CT ABDOMEN AND PELVIS WITH CONTRAST TECHNIQUE: Multidetector CT imaging of the abdomen and pelvis was performed using the standard protocol following bolus administration of intravenous contrast. CONTRAST:  142mL ISOVUE-300 IOPAMIDOL (ISOVUE-300) INJECTION 61% COMPARISON:  03/08/2017, 12/02/2016, 11/19/2015 and 04/20/2014 CT. FINDINGS: Lower chest: Minimal basilar scarring. Heart top-normal slightly enlarged. Small hiatal hernia. Hepatobiliary: Top-normal size liver. No worrisome hepatic lesion. Post cholecystectomy. No calcified common bile duct stone. Pancreas: No pancreatic mass or inflammation. Spleen: No splenic mass or enlargement. Adrenals/Urinary Tract: Slightly atrophic right kidney with chronic pelvis/ caliceal prominence which may reflect result of prior obstruction as noted on prior exams. No left renal collecting system obstruction. Minimally lobulated contour with tiny hyperdense lesion inferior aspect left kidney unchanged from 2015 exam. No adrenal mass. Contracted noncontrast filled urinary bladder unremarkable. Stomach/Bowel: Marked diverticulitis sigmoid colon spanning over 12.5 cm.  Although there may be an abscess within the wall of inflamed sigmoid colon, no extraluminal drainable abscess or free intraperitoneal air is noted. This segment of bowel can be assessed on follow-up after acute episode has cleared. Inflamed sigmoid colon contacts the left adnexae and posterosuperior aspect of the uterus. Small hiatal hernia.  No inflammation surrounds the appendix. Vascular/Lymphatic: Mild calcification lower abdominal aorta. No aortic aneurysm or large vessel occlusion. Increase number of pelvic lymph nodes greater on the left and lower abdominal aortic region felt to be reactive in origin. Reproductive: No primary adnexal abnormality or uterine abnormality. Other: Mild diastases rectus muscles. Musculoskeletal: Degenerative changes lumbar spine. IMPRESSION: Marked diverticulitis sigmoid colon spanning over 12.5 cm. Although there may be  an abscess within the wall of inflamed sigmoid colon, no extraluminal drainable abscess or free intraperitoneal air is noted. This segment of bowel can be assessed on follow-up after acute episode has cleared. Inflamed sigmoid colon contacts the left adnexae and posterosuperior aspect of the uterus. Pelvic lymph nodes greater on the left felt to be reactive in origin. Small hiatal hernia. No obstructing renal/ureteral calculi. Electronically Signed   By: Genia Del M.D.   On: 05/18/2017 15:09   US Abdomen Limited  Result Date: 05/19/2017 CLINICAL DATA:  Elevated LFTs EXAM: ULTRASOUND ABDOMEN LIMITED RIGHT UPPER QUADRANT COMPARISON:  05/18/2017 FINDINGS: Gallbladder: Status post cholecystectomy Common bile duct: Diameter: 3 mm. Liver: No focal lesion identified. Within normal limits in parenchymal echogenicity. Portal vein is patent on color Doppler imaging with normal direction of blood flow towards the liver. IMPRESSION: Status post cholecystectomy. No acute abnormality noted. Electronically Signed   By: Inez Catalina M.D.   On: 05/19/2017 13:50        Discharge Exam: Vitals:   06/16/17 2148 06/17/17 0605  BP: 121/71 118/65  Pulse: 70 (!) 53  Resp: 16 16  Temp: 97.8 F (36.6 C) 98.1 F (36.7 C)  SpO2: 95% 96%   Vitals:   06/16/17 0641 06/16/17 1506 06/16/17 2148 06/17/17 0605  BP:  (!) 120/59 121/71 118/65  Pulse:  60 70 (!) 53  Resp:  12 16 16   Temp:  98 F (36.7 C) 97.8 F (36.6 C) 98.1 F (36.7 C)  TempSrc:  Oral Oral Oral  SpO2:  95% 95% 96%  Weight: 99.3 kg (218 lb 14.7 oz)   98.3 kg (216 lb 11.4 oz)  Height:        General: Pt is alert, follows commands appropriately, not in acute distress Cardiovascular: Regular rate and rhythm, S1/S2 +, no murmurs, no rubs, no gallops Respiratory: Clear to auscultation bilaterally, no wheezing, no crackles, no rhonchi Abdominal: Soft, non tender, non distended, bowel sounds +, no guarding Extremities: no edema, no cyanosis, pulses palpable bilaterally DP and PT  Discharge Instructions   Allergies as of 06/17/2017      Reactions   Lisinopril Cough   Doxycycline Diarrhea   Severe headaches   Metronidazole Other (See Comments)   Severe headaches      Medication List    TAKE these medications   albuterol (2.5 MG/3ML) 0.083% nebulizer solution Commonly known as:  PROVENTIL Inhale 3 mLs into the lungs every 6 (six) hours as needed. Wheezing/shortness of breath   PROAIR HFA 108 (90 Base) MCG/ACT inhaler Generic drug:  albuterol Inhale 2 puffs into the lungs every 4 (four) hours as needed. For cough wheezing.   amLODipine 10 MG tablet Commonly known as:  NORVASC Take 10 mg by mouth daily.   aspirin EC 81 MG tablet Take 81 mg daily by mouth.   atorvastatin 10 MG tablet Commonly known as:  LIPITOR Take 10 mg daily by mouth.   chlorpheniramine 4 MG tablet Commonly known as:  CHLOR-TRIMETON Take 1 tablet (4 mg total) by mouth at bedtime.   clonazePAM 0.5 MG tablet Commonly known as:  KLONOPIN Take 0.5 mg by mouth 2 (two) times daily as needed for  anxiety.   dicyclomine 20 MG tablet Commonly known as:  BENTYL Take 20 mg by mouth 4 (four) times daily.   fluticasone 50 MCG/ACT nasal spray Commonly known as:  FLONASE Place 2 sprays daily into both nostrils.   furosemide 40 MG tablet Commonly known as:  LASIX Take  0.5 tablets (20 mg total) by mouth daily.   hyoscyamine 0.375 MG 12 hr tablet Commonly known as:  LEVBID Take 1 tablet (0.375 mg total) by mouth every 12 (twelve) hours as needed for cramping.   metoprolol succinate 50 MG 24 hr tablet Commonly known as:  TOPROL-XL Take 50 mg by mouth at bedtime.   omeprazole 40 MG capsule Commonly known as:  PRILOSEC TAKE 1 CAPSULE(40 MG) BY MOUTH TWICE DAILY   oxyCODONE 5 MG immediate release tablet Commonly known as:  ROXICODONE Take 1 tablet (5 mg total) by mouth every 4 (four) hours as needed for severe pain.   OXYGEN Inhale into the lungs.   polyethylene glycol packet Commonly known as:  MIRALAX / GLYCOLAX Take 17 g by mouth daily as needed for moderate constipation.   SYMBICORT 160-4.5 MCG/ACT inhaler Generic drug:  budesonide-formoterol Inhale 2 puffs 2 (two) times daily into the lungs.   topiramate 50 MG tablet Commonly known as:  TOPAMAX Take 50 mg by mouth 2 (two) times daily.   UNABLE TO FIND Med Name: CPAP   zolpidem 5 MG tablet Commonly known as:  AMBIEN Take 1 tablet (5 mg total) by mouth at bedtime as needed for sleep.       Follow-up Information    [name], MD Follow up.   Specialty:  General Surgery Why:  CAll for an appointment in 2 weeks. Contact information: [address] [place] [address] [phone]        Berkley Harvey, NP Follow up.   Specialty:  Nurse Practitioner Contact information: [place] [address] [phone]        Theodis Blaze, MD Follow up.   Specialty:  Internal Medicine Why:  call me with questions please [phone] Contact information: [address] [place] [address] [phone]            The results of significant diagnostics from this hospitalization (including imaging, microbiology, ancillary and laboratory) are listed below for reference.     Microbiology: Recent Results (from the past 240 hour(s))  MRSA PCR Screening     Status: None   Collection Time: 06/11/17  5:14 AM  Result Value Ref Range Status   MRSA by PCR NEGATIVE NEGATIVE Final    Comment:        The GeneXpert MRSA Assay (FDA approved for NASAL specimens only), is one component of a comprehensive MRSA colonization surveillance program. It is not intended to diagnose MRSA infection nor to guide or monitor treatment for MRSA infections.      Labs: Basic Metabolic Panel: Recent Labs  Lab 06/12/17 0439 06/15/17 0552 06/16/17 0437 06/17/17 0401  NA 138 141 142 141  K 5.0 4.1 3.9 3.8  CL 106 109 110 109  CO2 26 27 27 24   GLUCOSE 172* 94 96 91  BUN 10 <5* 6 8  CREATININE 0.75 0.67 0.70 0.75  CALCIUM 8.4* 8.3* 8.6* 8.5*   Liver Function Tests: Recent Labs  Lab 06/12/17 0439  AST 20  ALT 14  ALKPHOS 51  BILITOT 0.7  PROT 6.9  ALBUMIN 3.0*   CBC: Recent Labs  Lab 06/12/17 0439 06/15/17 0552 06/16/17 0437 06/17/17 0401  WBC 9.5 5.1 5.0 5.7  HGB 11.8* 8.4* 9.1* 9.7*  HCT 36.6 25.9* 28.1* 30.2*  MCV 100.0 102.4* 101.8* 101.3*  PLT 317 250 286 310    BNP (last 3 results) Recent Labs  01/11/17 1106 06/03/17 0550  BNP 10.4 17.5    SIGNED: Time coordinating discharge: 60 minutes  Faye Ramsay, MD  [facility] 06/17/2017, 8:37 AM Pager [phone]  If 7PM-7AM, please contact night-coverage [email or web address] Password [email or web address]

## 2017-06-17 NOTE — Care Management Note (Signed)
Case Management Note  Patient Details  Name: Ann [name] MRN: [id number] Date of Birth: [birth year]  Subjective/Objective:                    Action/Plan:d/c home.   Expected Discharge Date:  06/17/17               Expected Discharge Plan:  Home/Self Care  In-House Referral:     Discharge planning Services  CM Consult  Post Acute Care Choice:    Choice offered to:     DME Arranged:    DME Agency:     HH Arranged:    HH Agency:     Status of Service:  Completed, signed off  If discussed at [employer] of Stay Meetings, dates discussed:    Additional Comments:  Dessa Phi, RN 06/17/2017, 11:37 AM

## 2017-06-17 NOTE — Discharge Instructions (Signed)
[facility]      [facility] °[phone] ° °OPEN ABDOMINAL SURGERY: POST OP INSTRUCTIONS ° °Always review your discharge instruction sheet given to you by the facility where your surgery was performed. ° °IF YOU HAVE DISABILITY OR FAMILY LEAVE FORMS, YOU MUST BRING THEM TO THE OFFICE FOR PROCESSING.  PLEASE DO NOT GIVE THEM TO YOUR DOCTOR. ° °1. A prescription for pain medication may be given to you upon discharge.  Take your pain medication as prescribed, if needed.  If narcotic pain medicine is not needed, then you may take acetaminophen (Tylenol) or ibuprofen (Advil) as needed. °2. Take your usually prescribed medications unless otherwise directed. °3. If you need a refill on your pain medication, please contact your pharmacy. They will contact our office to request authorization.  Prescriptions will not be filled after 5pm or on week-ends. °4. You should follow a light diet the first few days after arrival home, such as soup and crackers, pudding, etc.unless your doctor has advised otherwise. A high-fiber, low fat diet can be resumed as tolerated.   Be sure to include lots of fluids daily. Most patients will experience some swelling and bruising on the chest and neck area.  Ice packs will help.  Swelling and bruising can take several days to resolve °5. Most patients will experience some swelling and bruising in the area of the incision. Ice pack will help. Swelling and bruising can take several days to resolve..  °6. It is common to experience some constipation if taking pain medication after surgery.  Increasing fluid intake and taking a stool softener will usually help or prevent this problem from occurring.  A mild laxative (Milk of Magnesia or Miralax) should be taken according to package directions if there are no bowel movements after 48 hours. °7.  You may have steri-strips (small skin tapes) in place directly over the incision.  These strips should be left on the skin for 7-10 days.  If your  surgeon used skin glue on the incision, you may shower in 24 hours.  The glue will flake off over the next 2-3 weeks.  Any sutures or staples will be removed at the office during your follow-up visit. You may find that a light gauze bandage over your incision may keep your staples from being rubbed or pulled. You may shower and replace the bandage daily. °8. ACTIVITIES:  You may resume regular (light) daily activities beginning the next day--such as daily self-care, walking, climbing stairs--gradually increasing activities as tolerated.  You may have sexual intercourse when it is comfortable.  Refrain from any heavy lifting or straining until approved by your doctor. °a. You may drive when you no longer are taking prescription pain medication, you can comfortably wear a seatbelt, and you can safely maneuver your car and apply brakes °b. Return to Work: ___________________________________ °9. You should see your doctor in the office for a follow-up appointment approximately two weeks after your surgery.  Make sure that you call for this appointment within a day or two after you arrive home to insure a convenient appointment time. °OTHER INSTRUCTIONS:  °_____________________________________________________________ °_____________________________________________________________ ° °WHEN TO CALL YOUR DOCTOR: °1. Fever over 101.0 °2. Inability to urinate °3. Nausea and/or vomiting °4. Extreme swelling or bruising °5. Continued bleeding from incision. °6. Increased pain, redness, or drainage from the incision. °7. Difficulty swallowing or breathing °8. Muscle cramping or spasms. °9. Numbness or tingling in hands or feet or around lips. ° °The clinic staff is available to   answer your questions during regular business hours.  Please dont hesitate to call and ask to speak to one of the nurses if you have concerns.  For further questions, please visit [email or web address]   Soft-Food Meal Plan A soft-food meal  plan includes foods that are safe and easy to swallow. This meal plan typically is used:  If you are having trouble chewing or swallowing foods.  As a transition meal plan after only having had liquid meals for a long period.  What do I need to know about the soft-food meal plan? A soft-food meal plan includes tender foods that are soft and easy to chew and swallow. In most cases, bite-sized pieces of food are easier to swallow. A bite-sized piece is about  inch or smaller. Foods in this plan do not need to be ground or pureed. Foods that are very hard, crunchy, or sticky should be avoided. Also, breads, cereals, yogurts, and desserts with nuts, seeds, or fruits should be avoided. What foods can I eat? Grains Rice and wild rice. Moist bread, dressing, pasta, and noodles. Well-moistened dry or cooked cereals, such as farina (cooked wheat cereal), oatmeal, or grits. Biscuits, breads, muffins, pancakes, and waffles that have been well moistened. Vegetables Shredded lettuce. Cooked, tender vegetables, including potatoes without skins. Vegetable juices. Broths or creamed soups made with vegetables that are not stringy or chewy. Strained tomatoes (without seeds). Fruits Canned or well-cooked fruits. Soft (ripe), peeled fresh fruits, such as peaches, nectarines, kiwi, cantaloupe, honeydew melon, and watermelon (without seeds). Soft berries with small seeds, such as strawberries. Fruit juices (without pulp). Meats and Other Protein Sources Moist, tender, lean beef. Mutton. Lamb. Veal. Chicken. [place]. Liver. Ham. Fish without bones. Eggs. Dairy Milk, milk drinks, and cream. Plain cream cheese and cottage cheese. Plain yogurt. Sweets/Desserts Flavored gelatin desserts. Custard. Plain ice cream, frozen yogurt, sherbet, milk shakes, and malts. Plain cakes and cookies. Plain hard candy. Other Butter, margarine (without trans fat), and cooking oils. Mayonnaise. Cream sauces. Mild spices, salt, and sugar.  Syrup, molasses, honey, and jelly. The items listed above may not be a complete list of recommended foods or beverages. Contact your dietitian for more options. What foods are not recommended? Grains Dry bread, toast, crackers that have not been moistened. Coarse or dry cereals, such as bran, granola, and shredded wheat. Tough or chewy crusty breads, such as [place] bread or baguettes. Vegetables Corn. Raw vegetables except shredded lettuce. Cooked vegetables that are tough or stringy. Tough, crisp, fried potatoes and potato skins. Fruits Fresh fruits with skins or seeds or both, such as apples, pears, or grapes. Stringy, high-pulp fruits, such as papaya, pineapple, coconut, or mango. Fruit leather, fruit roll-ups, and all dried fruits. Meats and Other Protein Sources Sausages and hot dogs. Meats with gristle. Fish with bones. Nuts, seeds, and chunky peanut or other nut butters. Sweets/Desserts Cakes or cookies that are very dry or chewy. The items listed above may not be a complete list of foods and beverages to avoid. Contact your dietitian for more information. This information is not intended to replace advice given to you by your health care provider. Make sure you discuss any questions you have with your health care provider. Document Released: 09/30/2007 Document Revised: 11/29/2015 Document Reviewed: 05/20/2013 Elsevier Interactive Patient Education  2017 [employer].

## 2017-06-17 NOTE — Progress Notes (Signed)
6 Days Post-Op    CC:  Abdominal pain  Subjective: She looks good, she is more tender than before today.  She has been up more.  Tolerating diet but anxious about eating.  Having BM's after eating.  Stools still dark.   Objective: Vital signs in last 24 hours: Temp:  [97.8 F (36.6 C)-98.1 F (36.7 C)] 98.1 F (36.7 C) (12/12 0605) Pulse Rate:  [53-70] 53 (12/12 0605) Resp:  [12-16] 16 (12/12 0605) BP: (118-121)/(59-71) 118/65 (12/12 0605) SpO2:  [95 %-96 %] 96 % (12/12 0605) Weight:  [98.3 kg (216 lb 11.4 oz)] 98.3 kg (216 lb 11.4 oz) (12/12 0605) Last BM Date: 06/17/17 360 PO recorded 1200 IV 1000 urine BM x 1 Afebrile, VSS Labs OK  Intake/Output from previous day: 12/11 0701 - 12/12 0700 In: 1560 [P.O.:360; I.V.:1200] Out: 1000 [Urine:1000] Intake/Output this shift: No intake/output data recorded.  General appearance: alert, cooperative and no distress Resp: clear to auscultation bilaterally GI: soft, sore, I took off the Pravena incision vac.  Incision looks great.  she has 1 small blister at the top left of the incision, in area of adhesive..  Lab Results:  Recent Labs    06/16/17 0437 06/17/17 0401  WBC 5.0 5.7  HGB 9.1* 9.7*  HCT 28.1* 30.2*  PLT 286 310    BMET Recent Labs    06/16/17 0437 06/17/17 0401  NA 142 141  K 3.9 3.8  CL 110 109  CO2 27 24  GLUCOSE 96 91  BUN 6 8  CREATININE 0.70 0.75  CALCIUM 8.6* 8.5*   PT/INR No results for input(s): LABPROT, INR in the last 72 hours.  Recent Labs  Lab 06/12/17 0439  AST 20  ALT 14  ALKPHOS 51  BILITOT 0.7  PROT 6.9  ALBUMIN 3.0*     Lipase     Component Value Date/Time   LIPASE 23 06/02/2017 2300     Medications: . acetaminophen  1,000 mg Oral Q8H  . amLODipine  10 mg Oral Daily  . aspirin EC  81 mg Oral Daily  . atorvastatin  10 mg Oral Daily  . chlorhexidine  15 mL Mouth Rinse BID  . dicyclomine  20 mg Oral TID AC & HS  . enoxaparin (LOVENOX) injection  40 mg Subcutaneous  Q24H  . feeding supplement (ENSURE ENLIVE)  237 mL Oral BID BM  . fluticasone  2 spray Each Nare Daily  . mouth rinse  15 mL Mouth Rinse q12n4p  . metoprolol succinate  50 mg Oral QHS  . mometasone-formoterol  2 puff Inhalation BID  . pantoprazole  40 mg Oral Daily  . saccharomyces boulardii  250 mg Oral BID  . simethicone  80 mg Oral QID  . topiramate  50 mg Oral BID   . 0.9 % sodium chloride with kcl 50 mL/hr at 06/17/17 0444   Anti-infectives (From admission, onward)   Start     Dose/Rate Route Frequency Ordered Stop   06/11/17 1600  polymyxin B 500,000 Units, bacitracin 50,000 Units in sodium chloride 0.9 % 500 mL irrigation  Status:  Discontinued       As needed 06/11/17 1600 06/11/17 1647   06/11/17 1340  cefoTEtan in Dextrose 5% (CEFOTAN) 2-2.08 GM-%(50ML) IVPB    Comments:  Algis Liming   : cabinet override      06/11/17 1340 06/12/17 0144   06/11/17 1323  ceFAZolin (ANCEF) 2-4 GM/100ML-% IVPB  Status:  Discontinued    Comments:  Algis Liming   :  cabinet override      06/11/17 1323 06/11/17 1342   06/11/17 0600  cefoTEtan (CEFOTAN) 2 g in dextrose 5 % 50 mL IVPB     2 g 100 mL/hr over 30 Minutes Intravenous On call to O.R. 06/10/17 1255 06/11/17 1511   06/11/17 0600  clindamycin (CLEOCIN) 900 mg, gentamicin (GARAMYCIN) 240 mg in sodium chloride 0.9 % 1,000 mL for intraperitoneal lavage  Status:  Discontinued      Intraperitoneal To Surgery 06/10/17 1255 06/11/17 1739   06/10/17 1600  neomycin (MYCIFRADIN) tablet 1,000 mg     1,000 mg Oral 3 times daily 06/10/17 1255 06/11/17 1559   06/08/17 1000  ertapenem (INVANZ) 1 g in sodium chloride 0.9 % 50 mL IVPB  Status:  Discontinued     1 g 100 mL/hr over 30 Minutes Intravenous Every 24 hours 06/08/17 0930 06/12/17 0735   06/03/17 1200  ciprofloxacin (CIPRO) IVPB 400 mg  Status:  Discontinued     400 mg 200 mL/hr over 60 Minutes Intravenous Every 12 hours 06/03/17 0207 06/08/17 0930   06/03/17 0445  metroNIDAZOLE (FLAGYL)  IVPB 500 mg  Status:  Discontinued     500 mg 100 mL/hr over 60 Minutes Intravenous Every 8 hours 06/03/17 0200 06/08/17 0930   06/03/17 0100  ciprofloxacin (CIPRO) IVPB 400 mg     400 mg 200 mL/hr over 60 Minutes Intravenous  Once 06/03/17 0055 06/03/17 0333   06/03/17 0100  metroNIDAZOLE (FLAGYL) IVPB 500 mg  Status:  Discontinued     500 mg 100 mL/hr over 60 Minutes Intravenous  Once 06/03/17 0055 06/03/17 0200     Assessment/Plan Sigmoid diverticulitis -last CT 11/28 showed focal wall thickening with moderate surrounding inflammation at the sigmoid colon, diverticulitisvs colitis at thesplenic flexure and descending colon possibly 2/2infectious or inflammatory bowel disease - last colonoscopy 08/26/2016 Dr. Havery Moros showed diverticulosis in the transverse colon and in the left colon S/popen sigmoid colectomy with primary anastomosis, 06/11/17 Dr. Jules Husbands 6   HTN H/o CVA GERD OSA Chronic repsiratory failure - on O2 at home Morbid obesity Anxiety Combined systolic and diastolic CHF  ID -cipro/flagyl 11/28>>12/3, invanz 12/3-12/7 FEN -IVF, soft diet VTE -SCDs, lovenox Follow up -Dr. Barry Dienes  Plan:  I think she can go home today.  I will get her follow up for staple removal and Dr. Barry Dienes.  DC instructions are in the AVS.  She can use ibuprofen, Tylenol, and oxycodone for pain control.      LOS: 14 days    Keerthi Hazell 06/17/2017 [phone]

## 2017-07-14 ENCOUNTER — Ambulatory Visit: Payer: Medicaid Other | Admitting: Neurology

## 2017-08-06 ENCOUNTER — Emergency Department (HOSPITAL_COMMUNITY)
Admission: EM | Admit: 2017-08-06 | Discharge: 2017-08-06 | Disposition: A | Discharge status: Home / Self Care | Payer: Medicaid Other | Attending: Emergency Medicine | Admitting: Emergency Medicine

## 2017-08-06 ENCOUNTER — Other Ambulatory Visit: Payer: Self-pay

## 2017-08-06 ENCOUNTER — Encounter (HOSPITAL_COMMUNITY): Payer: Self-pay | Admitting: Emergency Medicine

## 2017-08-06 ENCOUNTER — Emergency Department (HOSPITAL_COMMUNITY): Payer: Medicaid Other

## 2017-08-06 DIAGNOSIS — K529 Noninfective gastroenteritis and colitis, unspecified: Secondary | ICD-10-CM | POA: Diagnosis not present

## 2017-08-06 DIAGNOSIS — Z9889 Other specified postprocedural states: Secondary | ICD-10-CM | POA: Diagnosis not present

## 2017-08-06 DIAGNOSIS — R1031 Right lower quadrant pain: Secondary | ICD-10-CM | POA: Diagnosis present

## 2017-08-06 DIAGNOSIS — N183 Chronic kidney disease, stage 3 (moderate): Secondary | ICD-10-CM | POA: Diagnosis not present

## 2017-08-06 DIAGNOSIS — I129 Hypertensive chronic kidney disease with stage 1 through stage 4 chronic kidney disease, or unspecified chronic kidney disease: Secondary | ICD-10-CM | POA: Insufficient documentation

## 2017-08-06 DIAGNOSIS — Z79899 Other long term (current) drug therapy: Secondary | ICD-10-CM | POA: Insufficient documentation

## 2017-08-06 DIAGNOSIS — R112 Nausea with vomiting, unspecified: Secondary | ICD-10-CM | POA: Diagnosis not present

## 2017-08-06 LAB — URINALYSIS, ROUTINE W REFLEX MICROSCOPIC
BILIRUBIN URINE: NEGATIVE
Glucose, UA: NEGATIVE mg/dL
HGB URINE DIPSTICK: NEGATIVE
KETONES UR: NEGATIVE mg/dL
Leukocytes, UA: NEGATIVE
NITRITE: NEGATIVE
PROTEIN: NEGATIVE mg/dL
SPECIFIC GRAVITY, URINE: 1.025 (ref 1.005–1.030)
pH: 6 (ref 5.0–8.0)

## 2017-08-06 LAB — COMPREHENSIVE METABOLIC PANEL
ALK PHOS: 75 U/L (ref 38–126)
ALT: 19 U/L (ref 14–54)
ANION GAP: 8 (ref 5–15)
AST: 23 U/L (ref 15–41)
Albumin: 3.6 g/dL (ref 3.5–5.0)
BILIRUBIN TOTAL: 0.6 mg/dL (ref 0.3–1.2)
BUN: 12 mg/dL (ref 6–20)
CALCIUM: 9 mg/dL (ref 8.9–10.3)
CO2: 21 mmol/L — ABNORMAL LOW (ref 22–32)
CREATININE: 0.8 mg/dL (ref 0.44–1.00)
Chloride: 108 mmol/L (ref 101–111)
GFR calc non Af Amer: >60 mL/min (ref >60)
GLUCOSE: 99 mg/dL (ref 65–99)
Potassium: 4.5 mmol/L (ref 3.5–5.1)
Sodium: 137 mmol/L (ref 135–145)
TOTAL PROTEIN: 7.5 g/dL (ref 6.5–8.1)

## 2017-08-06 LAB — CBC
HCT: 41.5 % (ref 36.0–46.0)
Hemoglobin: 13.5 g/dL (ref 12.0–15.0)
MCH: 31.2 pg (ref 26.0–34.0)
MCHC: 32.5 g/dL (ref 30.0–36.0)
MCV: 95.8 fL (ref 78.0–100.0)
PLATELETS: 390 10*3/uL (ref 150–400)
RBC: 4.33 MIL/uL (ref 3.87–5.11)
RDW: 13.9 % (ref 11.5–15.5)
WBC: 8.7 10*3/uL (ref 4.0–10.5)

## 2017-08-06 LAB — LIPASE, BLOOD: Lipase: 27 U/L (ref 11–51)

## 2017-08-06 MED ORDER — IOPAMIDOL (ISOVUE-300) INJECTION 61%
INTRAVENOUS | Status: AC
  Filled 2017-08-06: qty 100

## 2017-08-06 MED ORDER — METOPROLOL TARTRATE 5 MG/5ML IV SOLN
5.0000 mg | Freq: Once | INTRAVENOUS | Status: AC
  Administered 2017-08-06: 5 mg via INTRAVENOUS
  Filled 2017-08-06: qty 5

## 2017-08-06 MED ORDER — METOPROLOL TARTRATE 25 MG PO TABS
100.0000 mg | ORAL_TABLET | Freq: Once | ORAL | Status: AC
  Administered 2017-08-06: 100 mg via ORAL
  Filled 2017-08-06: qty 4

## 2017-08-06 MED ORDER — PROMETHAZINE HCL 12.5 MG PO TABS: 12.5000 mg | ORAL_TABLET | Freq: Four times a day (QID) | ORAL | 0 refills | Status: DC | PRN

## 2017-08-06 MED ORDER — ONDANSETRON HCL 4 MG/2ML IJ SOLN
4.0000 mg | Freq: Once | INTRAMUSCULAR | Status: AC
  Administered 2017-08-06: 4 mg via INTRAVENOUS
  Filled 2017-08-06: qty 2

## 2017-08-06 MED ORDER — IOPAMIDOL (ISOVUE-300) INJECTION 61%
100.0000 mL | Freq: Once | INTRAVENOUS | Status: AC | PRN
  Administered 2017-08-06: 100 mL via INTRAVENOUS

## 2017-08-06 MED ORDER — FENTANYL CITRATE (PF) 100 MCG/2ML IJ SOLN
25.0000 ug | INTRAMUSCULAR | Status: DC | PRN
  Administered 2017-08-06 (×2): 25 ug via INTRAVENOUS
  Filled 2017-08-06 (×2): qty 2

## 2017-08-06 MED ORDER — SODIUM CHLORIDE 0.9 % IV BOLUS (SEPSIS)
1000.0000 mL | Freq: Once | INTRAVENOUS | Status: AC
  Administered 2017-08-06: 1000 mL via INTRAVENOUS

## 2017-08-06 MED ORDER — OXYCODONE HCL 5 MG PO TABS: 5.0000 mg | ORAL_TABLET | ORAL | 0 refills | Status: DC | PRN

## 2017-08-06 MED ORDER — AMLODIPINE BESYLATE 5 MG PO TABS
10.0000 mg | ORAL_TABLET | Freq: Once | ORAL | Status: AC
  Administered 2017-08-06: 10 mg via ORAL
  Filled 2017-08-06: qty 2

## 2017-08-06 NOTE — ED Triage Notes (Signed)
Pt complaint of worsening RLQ pain with associated n/v/d over past 3 days; pt had "some colon removed from perforation."

## 2017-08-06 NOTE — ED Notes (Signed)
MD Vanita Panda notified regarding patient hypertension. MD states he is en route to patient room now.

## 2017-08-06 NOTE — ED Provider Notes (Signed)
[facility] DEPT Provider Note   CSN: [id number] Arrival date & time: 08/06/17  1139     History   Chief Complaint Chief Complaint  Patient presents with  . Abdominal Pain    HPI Ann [name] is a 62 y.o. female.  HPI Patient with a history notable for diverticulitis with perforation 2 months ago now presents with right lower quadrant abdominal pain nausea, vomiting, diarrhea. Patient notes that she was doing generally well, recovering unremarkably from her surgery, until about 3 days ago. Since that time she has had worsening pain in the right lower quadrant. Subsequently she developed nausea, vomiting, diarrhea, which has been persistent, worsening of the past 24 hours. No fever. No clear alleviating or exacerbating factors.  No confusion, dissertation, chest pain, dyspnea.  Past Medical History:  Diagnosis Date  . Adenoma of colon   . Allergy    seasonal  . Anemia    with past pregnancy -not recent  . Anxiety   . Arthritis   . Borderline diabetes   . Chronic cystitis   . Complication of anesthesia    02-20-2014 ([facility]) INTRA-OP RESPIRATORY FAILURE SECONDARY TO POSSIBLE MUCOUS ASPIRATION/  ALSO 06-06-2013 [facility]) POST-OP DESATURATION  EVEN USING CPAP, States some issues if Propofol given to rapidly.  . Diverticulitis   . Diverticulosis of colon   . Dyspnea   . Family history of adverse reaction to anesthesia    PER PT SISTER DIED AFTER GENERAL ANESTHESIA DUE TO UNDIAGNOSED OSA  . GERD (gastroesophageal reflux disease)   . H/O hiatal hernia   . Headache(784.0)    migraines, decreased since turning 50's  . History of chronic cough    "tight sounding cough"  . History of esophageal dilatation   . History of kidney stones   . History of MRSA infection    3/ 2015  AXILLARY ABSCESS  . History of recurrent UTIs   . History of TIAs NO RESIDUAL   1980;  2005;   2008 PT STATES PER CT  SCARRING RIGHT SIDE OF BRAIN  . Hyperlipidemia   .  Hypertension   . Kidney disease    III  . Neuromuscular disorder ([place])    HH  . Neuropathy    pt denies this 07-05-2015  . Nocturia   . OSA on CPAP    PER SLEEP STUDY 09-08-2012  MODERATE OSA. not used in awhile, but thinks needs to start back using due to some weight gain.  . Sleep apnea   . Stroke [facility]) [phone]   TIA'S  . SUI (stress urinary incontinence, female)     Patient Active Problem List   Diagnosis Date Noted  . Abnormal CT scan, colon   . HLD (hyperlipidemia) 06/03/2017  . 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 GERD (gastroesophageal reflux disease) 06/03/2017  . Anxiety 06/03/2017  . Chronic combined systolic (congestive) and diastolic (congestive) heart failure ([place]) 06/03/2017  . Hypokalemia 06/03/2017  . Diverticulitis large intestine 06/03/2017  . Lower abdominal pain 06/03/2017  . Non-intractable vomiting with nausea   . Abnormal CT of the abdomen   . Generalized abdominal pain   . LFT elevation   . Diverticulitis of colon 05/18/2017  . Symptomatic cholelithiasis 02/10/2017  . Hypoxia 01/11/2017  . Dyspnea 01/11/2017  . Dysphagia 04/04/2015  . History of esophageal stricture 04/04/2015  . Morbid (severe) obesity due to excess calories ([place]) 04/01/2015  . ACE-inhibitor cough 03/12/2015  . Upper airway cough syndrome 03/12/2015  . Laryngopharyngeal reflux (LPR)  03/12/2015  . Elevated lipids 07/18/2014  . Chest pain 07/18/2014  . Internal hemorrhoids 04/25/2014  . Ureteral stone with hydronephrosis 04/22/2014  . Post-op pain 04/21/2014  . Rectocele, grade 3 02/20/2014  . Female rectocele without uterine prolapse 08/04/2013  . Unspecified constipation 07/23/2013  . Chronic respiratory failure with hypoxia ([place]) 06/07/2013  . Cystocele 06/06/2013  . Anosmia 01/25/2013  . Unspecified nutritional deficiency   . Polyneuropathy in other diseases classified elsewhere ([place])   . OSA (obstructive sleep apnea)   . Cough 07/27/2012  . Essential  hypertension     Past Surgical History:  Procedure Laterality Date  . ANTERIOR AND POSTERIOR REPAIR N/A 06/06/2013   Procedure: Anterior vaginal vault repair, Sacrospinous ligament fixation with UPHOLD lite, Kelly plication, Sacrospinous mesh fixation, Solyx transurethral sling;  Surgeon: Ailene Rud, MD;  Location: [facility];  Service: Urology;  Laterality: N/A;  . CHOLECYSTECTOMY N/A 02/10/2017   Procedure: LAPAROSCOPIC CHOLECYSTECTOMY;  Surgeon: Coralie Keens, MD;  Location: [facility];  Service: General;  Laterality: N/A;  . COLECTOMY WITH COLOSTOMY CREATION/HARTMANN PROCEDURE N/A 06/11/2017   Procedure: OPEN SIGMOID COLECTOMY;  Surgeon: [name], MD;  Location: [facility];  Service: General;  Laterality: N/A;  . CYSTOSCOPY W/ URETERAL STENT PLACEMENT Right 04/21/2014   Procedure: CYSTOSCOPY WITH RETROGRADE PYELOGRAM/URETERAL STENT PLACEMENT;  Surgeon: Ailene Rud, MD;  Location: [facility];  Service: Urology;  Laterality: Right;  . CYSTOSCOPY W/ URETERAL STENT PLACEMENT Right 11/21/2014   Procedure: CYSTOSCOPY WITH RETROGRADE PYELOGRAM, URETEROSCOPY WITH STONE REMOVAL, URETERAL STENT PLACEMENT;  Surgeon: Carolan Clines, MD;  Location[facility];  Service: Urology;  Laterality: Right;  . CYSTOSCOPY W/ URETERAL STENT PLACEMENT Left 02/01/2016   Procedure: CYSTO, LEFT URETEROSCOPY, LEFT RETROGRADE AND LEFT URETERAL STENT PLACEMENT;  Surgeon: Carolan Clines, MD;  [facility];  Service: Urology;  Laterality: Left;  . CYSTOSCOPY WITH RETROGRADE PYELOGRAM, URETEROSCOPY AND STENT PLACEMENT Right 06/05/2014   Procedure: CYSTOSCOPY WITH RIGHT RETROGRADE PYELOGRAM, RIGHT URETEROSCOPY, STONE EXTRACTION AND RIGHT DOUBLE J STENT PLACEMENT;  Surgeon: Ailene Rud, MD;  Location: [facility];  Service: Urology;  Laterality: Right;  . ESOPHAGEAL DILATION  X2  LAST ONE  JUNE 2014   has had 9 of these  . ESOPHAGEAL MANOMETRY N/A 09/10/2015   Procedure:  ESOPHAGEAL MANOMETRY (EM);  Surgeon: Manus Gunning, MD;  [facility];  Service: Gastroenterology;  Laterality: N/A;  . HOLMIUM LASER APPLICATION Right 11/94/1740   Procedure: HOLMIUM LASER OF STONE ;  Surgeon: Ailene Rud, MD;  Location: [facility];  Service: Urology;  Laterality: Right;  . KNEE ARTHROSCOPY Bilateral 2008  . LAPAROSCOPIC CHOLECYSTECTOMY  02/10/2017  . MULTIPLE TOOTH EXTRACTIONS     past hx  . RECTOCELE REPAIR N/A 02/20/2014   Procedure: POSTERIOR REPAIR (RECTOCELE) WITH VAGINAL VAULT REPAIR WITH ACELL GRAFT PLACEMENT, PERINEOPLASTY, ENTEROCELE REPAIR;  Surgeon: Ailene Rud, MD;  [facility];  Service: Urology;  Laterality: N/A;  . TUBAL LIGATION  1987    OB History    No data available       Home Medications    Prior to Admission medications   Medication Sig Start Date End Date Taking? Authorizing Provider  amLODipine (NORVASC) 10 MG tablet Take 10 mg by mouth daily.    Yes [provider]  atorvastatin (LIPITOR) 10 MG tablet Take 10 mg daily by mouth. 05/15/17  Yes [provider]  cephALEXin (KEFLEX) 500 MG capsule Take 500 mg by mouth  3 (three) times daily. 07/29/17  Yes [provider]  chlorpheniramine (CHLOR-TRIMETON) 4 MG tablet Take 1 tablet (4 mg total) by mouth at bedtime. 03/08/15  Yes Chesley Mires, MD  clonazePAM (KLONOPIN) 0.5 MG tablet Take 0.5 mg by mouth 2 (two) times daily as needed for anxiety.    Yes [provider]  furosemide (LASIX) 40 MG tablet Take 0.5 tablets (20 mg total) by mouth daily. 05/28/17  Yes Domenic Polite, MD  metoprolol succinate (TOPROL-XL) 50 MG 24 hr tablet Take 50 mg by mouth at bedtime.  08/08/15  Yes [provider]  omeprazole (PRILOSEC) 40 MG capsule TAKE 1 CAPSULE(40 MG) BY MOUTH TWICE DAILY 09/17/16  Yes Armbruster, [name], MD  promethazine (PHENERGAN) 12.5 MG tablet Take 12.5 mg by mouth every 4 (four) hours as needed for  nausea. 07/09/17  Yes [provider]  topiramate (TOPAMAX) 50 MG tablet Take 50 mg by mouth 2 (two) times daily.   Yes [provider]  UNABLE TO FIND Med Name: CPAP   Yes [provider]  hyoscyamine (LEVBID) 0.375 MG 12 hr tablet Take 1 tablet (0.375 mg total) by mouth every 12 (twelve) hours as needed for cramping. Patient not taking: Reported on 08/06/2017 05/28/17   Domenic Polite, MD  oxyCODONE (ROXICODONE) 5 MG immediate release tablet Take 1 tablet (5 mg total) by mouth every 4 (four) hours as needed for severe pain. Patient not taking: Reported on 08/06/2017 06/16/17   Theodis Blaze, MD  OXYGEN Inhale into the lungs.    [provider]  polyethylene glycol (MIRALAX / GLYCOLAX) packet Take 17 g by mouth daily as needed for moderate constipation. Patient not taking: Reported on 08/06/2017 01/16/17   Bonnielee Haff, MD  zolpidem (AMBIEN) 5 MG tablet Take 1 tablet (5 mg total) by mouth at bedtime as needed for sleep. Patient not taking: Reported on 08/06/2017 06/16/17   Theodis Blaze, MD    Family History Family History  Problem Relation Age of Onset  . Colon cancer Father 13       died at 12  . Heart disease Mother        died at 41  . Heart attack Mother   . Heart attack Sister        died at 5  . Hypertension Brother   . Heart attack Cousin 101       with death  . Heart attack Cousin 87       died with MI  . Esophageal cancer Neg Hx   . Rectal cancer Neg Hx   . Stomach cancer Neg Hx   . Colon polyps Neg Hx     Social History Social History   Tobacco Use  . Smoking status: Never Smoker  . Smokeless tobacco: Never Used  Substance Use Topics  . Alcohol use: No    Alcohol/week: 0.0 oz  . Drug use: No     Allergies   Lisinopril; Doxycycline; and Metronidazole   Review of Systems Review of Systems  Constitutional:       Per HPI, otherwise negative  HENT:       Per HPI, otherwise negative  Respiratory:       Per HPI,  otherwise negative  Cardiovascular:       Per HPI, otherwise negative  Gastrointestinal: Positive for diarrhea, nausea and vomiting.  Endocrine:       Negative aside from HPI  Genitourinary:       Neg aside from  HPI   Musculoskeletal:       Per HPI, otherwise negative  Skin: Negative.   Neurological: Negative for syncope.     Physical Exam Updated Vital Signs BP (!) 144/115   Pulse 83   Temp (!) 97.4 F (36.3 C) (Oral)   Resp 20   SpO2 96%   Physical Exam  Constitutional: She is oriented to person, place, and time. She appears well-developed and well-nourished. No distress.  Uncomfortable appearing obese F  HENT:  Head: Normocephalic and atraumatic.  Eyes: Conjunctivae and EOM are normal.  Cardiovascular: Normal rate and regular rhythm.  Pulmonary/Chest: Effort normal and breath sounds normal. No stridor. No respiratory distress.  Abdominal: She exhibits no distension.    Musculoskeletal: She exhibits no edema.  Neurological: She is alert and oriented to person, place, and time. No cranial nerve deficit.  Skin: Skin is warm and dry.     Psychiatric: She has a normal mood and affect.  Nursing note and vitals reviewed.    ED Treatments / Results  Labs (all labs ordered are listed, but only abnormal results are displayed) Labs Reviewed  COMPREHENSIVE METABOLIC PANEL - Abnormal; Notable for the following components:      Result Value   CO2 21 (*)    All other components within normal limits  LIPASE, BLOOD  CBC  URINALYSIS, ROUTINE W REFLEX MICROSCOPIC    EKG  EKG Interpretation None       Radiology Ct Abdomen Pelvis W Contrast  Result Date: 08/06/2017 CLINICAL DATA:  Right lower quadrant pain and nausea and vomiting for several days, history of previous colon resection EXAM: CT ABDOMEN AND PELVIS WITH CONTRAST TECHNIQUE: Multidetector CT imaging of the abdomen and pelvis was performed using the standard protocol following bolus administration of  intravenous contrast. CONTRAST:  118mL ISOVUE-300 IOPAMIDOL (ISOVUE-300) INJECTION 61% COMPARISON:  06/09/2017 FINDINGS: Lower chest: No acute abnormality. Hepatobiliary: No focal liver abnormality is seen. Status post cholecystectomy. No biliary dilatation. Pancreas: Unremarkable. No pancreatic ductal dilatation or surrounding inflammatory changes. Spleen: Normal in size without focal abnormality. Adrenals/Urinary Tract: The adrenal glands are within normal limits. Kidneys are well visualized bilaterally. The bladder is decompressed. The ureters are within normal limits. No renal calculi are seen. Stomach/Bowel: Postsurgical changes are noted consistent with prior sigmoid resection. The appendix is well visualized and within normal limits. No obstructive changes are seen. There are mild inflammatory changes identified surrounding loops of mid and distal ileum. Mild wall edema is noted as well. This may simply represent a mild enteritis. No evidence of perforation or fistulization is seen. Vascular/Lymphatic: Aortic atherosclerosis. No enlarged abdominal or pelvic lymph nodes. Reproductive: Uterus and bilateral adnexa are unremarkable. Other: Minimal free fluid is noted within the pelvis likely related to the inflammatory changes of the small bowel. Postsurgical changes in the anterior abdominal wall are seen. Musculoskeletal: Degenerative changes of the lumbar spine are noted. No acute bony abnormality is seen. IMPRESSION: Postsurgical changes consistent with the given clinical history with anterior abdominal wall changes as well. New small bowel edema and inflammatory change in the mid and distal ileum. No fistulization or perforation is identified. No obstructive changes are noted. These changes may simply represent a simple gastroenteritis. Continued follow-up is recommended. No other focal abnormality is noted. Electronically Signed   By: Inez Catalina M.D.   On: 08/06/2017 14:54    Procedures Procedures  (including critical care time)  Medications Ordered in ED Medications  fentaNYL (SUBLIMAZE) injection 25 mcg (25 mcg Intravenous  Given 08/06/17 1317)  iopamidol (ISOVUE-300) 61 % injection (not administered)  amLODipine (NORVASC) tablet 10 mg (not administered)  metoprolol tartrate (LOPRESSOR) injection 5 mg (not administered)  metoprolol tartrate (LOPRESSOR) tablet 100 mg (not administered)  sodium chloride 0.9 % bolus 1,000 mL (1,000 mLs Intravenous New Bag/Given 08/06/17 1314)  ondansetron (ZOFRAN) injection 4 mg (4 mg Intravenous Given 08/06/17 1316)  iopamidol (ISOVUE-300) 61 % injection 100 mL (100 mLs Intravenous Contrast Given 08/06/17 1413)     Initial Impression / Assessment and Plan / ED Course  I have reviewed the triage vital signs and the nursing notes.  Pertinent labs & imaging results that were available during my care of the patient were reviewed by me and considered in my medical decision making (see chart for details).     3:20 PM Patient in no distress, on her cellular telephone. Reviewed all findings with the patient at length, including no evidence for perforation, obstruction Labs also reassuring, no evidence for infection, peritonitis, bacteremia, sepsis, acute pancreatitis. Though the patient does have some pain in the right lower quadrant, has a likely enteritis, no evidence for systemwide disease, given the absence of fever, we discussed the importance of following up with GI, and initiation of medication for pain and nausea management. Patient notes that she is previously had good relief with oxycodone, Phenergan.  Final Clinical Impressions(s) / ED Diagnoses  Enteritis   Carmin Muskrat, MD 08/06/17 1521

## 2017-08-06 NOTE — Discharge Instructions (Signed)
As discussed, your evaluation today has been largely reassuring.  But, it is important that you monitor your condition carefully, and do not hesitate to return to the ED if you develop new, or concerning changes in your condition.  You have been diagnosed with enteritis.  Otherwise, please follow-up with your physician for appropriate ongoing care.

## 2017-08-11 ENCOUNTER — Other Ambulatory Visit: Payer: Self-pay | Admitting: Orthopedic Surgery

## 2017-08-14 ENCOUNTER — Other Ambulatory Visit: Payer: Self-pay | Admitting: Orthopedic Surgery

## 2017-08-21 ENCOUNTER — Encounter (HOSPITAL_COMMUNITY)
Admission: RE | Admit: 2017-08-21 | Discharge: 2017-08-21 | Disposition: A | Discharge status: Home / Self Care | Payer: Medicaid Other | Source: Ambulatory Visit | Attending: Orthopedic Surgery | Admitting: Orthopedic Surgery

## 2017-08-21 ENCOUNTER — Other Ambulatory Visit: Payer: Self-pay

## 2017-08-21 ENCOUNTER — Encounter (HOSPITAL_COMMUNITY): Payer: Self-pay

## 2017-08-21 DIAGNOSIS — K228 Other specified diseases of esophagus: Secondary | ICD-10-CM | POA: Insufficient documentation

## 2017-08-21 DIAGNOSIS — Z79899 Other long term (current) drug therapy: Secondary | ICD-10-CM | POA: Diagnosis not present

## 2017-08-21 DIAGNOSIS — F419 Anxiety disorder, unspecified: Secondary | ICD-10-CM | POA: Insufficient documentation

## 2017-08-21 DIAGNOSIS — Z01812 Encounter for preprocedural laboratory examination: Secondary | ICD-10-CM | POA: Insufficient documentation

## 2017-08-21 DIAGNOSIS — Z8673 Personal history of transient ischemic attack (TIA), and cerebral infarction without residual deficits: Secondary | ICD-10-CM | POA: Diagnosis not present

## 2017-08-21 DIAGNOSIS — N183 Chronic kidney disease, stage 3 (moderate): Secondary | ICD-10-CM | POA: Diagnosis not present

## 2017-08-21 DIAGNOSIS — Z9049 Acquired absence of other specified parts of digestive tract: Secondary | ICD-10-CM | POA: Insufficient documentation

## 2017-08-21 DIAGNOSIS — I10 Essential (primary) hypertension: Secondary | ICD-10-CM | POA: Insufficient documentation

## 2017-08-21 DIAGNOSIS — J9690 Respiratory failure, unspecified, unspecified whether with hypoxia or hypercapnia: Secondary | ICD-10-CM | POA: Diagnosis not present

## 2017-08-21 DIAGNOSIS — Z01810 Encounter for preprocedural cardiovascular examination: Secondary | ICD-10-CM | POA: Insufficient documentation

## 2017-08-21 DIAGNOSIS — G4733 Obstructive sleep apnea (adult) (pediatric): Secondary | ICD-10-CM | POA: Insufficient documentation

## 2017-08-21 DIAGNOSIS — E785 Hyperlipidemia, unspecified: Secondary | ICD-10-CM | POA: Insufficient documentation

## 2017-08-21 DIAGNOSIS — K219 Gastro-esophageal reflux disease without esophagitis: Secondary | ICD-10-CM | POA: Diagnosis not present

## 2017-08-21 DIAGNOSIS — R7303 Prediabetes: Secondary | ICD-10-CM | POA: Diagnosis not present

## 2017-08-21 DIAGNOSIS — K449 Diaphragmatic hernia without obstruction or gangrene: Secondary | ICD-10-CM | POA: Insufficient documentation

## 2017-08-21 DIAGNOSIS — Z9889 Other specified postprocedural states: Secondary | ICD-10-CM | POA: Diagnosis not present

## 2017-08-21 DIAGNOSIS — Z9981 Dependence on supplemental oxygen: Secondary | ICD-10-CM | POA: Insufficient documentation

## 2017-08-21 HISTORY — DX: Unilateral primary osteoarthritis, unspecified knee: M17.10

## 2017-08-21 HISTORY — DX: Osteoarthritis of knee, unspecified: M17.9

## 2017-08-21 LAB — URINALYSIS, ROUTINE W REFLEX MICROSCOPIC
Bilirubin Urine: NEGATIVE
Glucose, UA: NEGATIVE mg/dL
Hgb urine dipstick: NEGATIVE
Ketones, ur: NEGATIVE mg/dL
LEUKOCYTES UA: NEGATIVE
NITRITE: NEGATIVE
PH: 6 (ref 5.0–8.0)
Protein, ur: NEGATIVE mg/dL
SPECIFIC GRAVITY, URINE: 1.013 (ref 1.005–1.030)

## 2017-08-21 LAB — CBC WITH DIFFERENTIAL/PLATELET
BASOS ABS: 0 10*3/uL (ref 0.0–0.1)
Basophils Relative: 0 %
Eosinophils Absolute: 0.1 10*3/uL (ref 0.0–0.7)
Eosinophils Relative: 2 %
HEMATOCRIT: 40.7 % (ref 36.0–46.0)
Hemoglobin: 12.8 g/dL (ref 12.0–15.0)
LYMPHS ABS: 1.9 10*3/uL (ref 0.7–4.0)
LYMPHS PCT: 31 %
MCH: 30 pg (ref 26.0–34.0)
MCHC: 31.4 g/dL (ref 30.0–36.0)
MCV: 95.5 fL (ref 78.0–100.0)
MONO ABS: 0.4 10*3/uL (ref 0.1–1.0)
Monocytes Relative: 6 %
NEUTROS ABS: 3.7 10*3/uL (ref 1.7–7.7)
Neutrophils Relative %: 61 %
Platelets: 367 10*3/uL (ref 150–400)
RBC: 4.26 MIL/uL (ref 3.87–5.11)
RDW: 14.6 % (ref 11.5–15.5)
WBC: 6.2 10*3/uL (ref 4.0–10.5)

## 2017-08-21 LAB — APTT: APTT: 31 s (ref 24–36)

## 2017-08-21 LAB — BASIC METABOLIC PANEL
ANION GAP: 11 (ref 5–15)
BUN: 12 mg/dL (ref 6–20)
CHLORIDE: 107 mmol/L (ref 101–111)
CO2: 21 mmol/L — AB (ref 22–32)
Calcium: 9.2 mg/dL (ref 8.9–10.3)
Creatinine, Ser: 1.04 mg/dL — ABNORMAL HIGH (ref 0.44–1.00)
GFR calc Af Amer: >60 mL/min (ref >60)
GFR, EST NON AFRICAN AMERICAN: 58 mL/min — AB (ref >60)
GLUCOSE: 116 mg/dL — AB (ref 65–99)
POTASSIUM: 3.8 mmol/L (ref 3.5–5.1)
Sodium: 139 mmol/L (ref 135–145)

## 2017-08-21 LAB — SURGICAL PCR SCREEN
MRSA, PCR: NEGATIVE
STAPHYLOCOCCUS AUREUS: NEGATIVE

## 2017-08-21 LAB — PROTIME-INR
INR: 0.98
Prothrombin Time: 12.9 seconds (ref 11.4–15.2)

## 2017-08-21 NOTE — Pre-Procedure Instructions (Signed)
MIKITA LESMEISTER  08/21/2017      [facility] [place], [address] [place] [address] Phone: [phone] Fax: [phone]  [place], [address] [place] [address] Phone: [phone] Fax: [phone]    Your procedure is scheduled on Monday, August 31, 2017  Report to [facility] Admitting Entrance "A" at 9:50AM   Call this number if you have problems the morning of surgery:  [phone]   Remember:  Do not eat food or drink liquids after midnight.  Take these medicines the morning of surgery with A SIP OF WATER: AmLODipine (NORVASC), Omeprazole (PRILOSEC), and Topiramate (TOPAMAX). If needed Promethazine (PHENERGAN) for nausea, ClonazePAM (KLONOPIN) for anxiety, and Acetaminophen (TYLENOL) for pain.  7 days before surgery (Feb. 18), stop taking all Aspirins, Vitamins, Fish oils, and Herbal medications. Also stop all NSAIDS i.e. Advil, Ibuprofen, Motrin, Aleve, Anaprox, Naproxen, BC and Goody Powders.   Do not wear jewelry, make-up or nail polish.  Do not wear lotions, powders, perfumes, or deodorant.  Do not shave 48 hours prior to surgery.    Do not bring valuables to the hospital.  [facility] is not responsible for any belongings or valuables.  Contacts, dentures or bridgework may not be worn into surgery.  Leave your suitcase in the car.  After surgery it may be brought to your room.  For patients admitted to the hospital, discharge time will be determined by your treatment team.  Patients discharged the day of surgery will not be allowed to drive home.   Special instructions:   [facility]- Preparing For Surgery  Before surgery, you can play an important role. Because skin is not sterile, your skin needs to be as free of germs as possible. You can reduce the number of germs on your  skin by washing with CHG (chlorahexidine gluconate) Soap before surgery.  CHG is an antiseptic cleaner which kills germs and bonds with the skin to continue killing germs even after washing.  Please do not use if you have an allergy to CHG or antibacterial soaps. If your skin becomes reddened/irritated stop using the CHG.  Do not shave (including legs and underarms) for at least 48 hours prior to first CHG shower. It is OK to shave your face.  Please follow these instructions carefully.   1. Shower the NIGHT BEFORE SURGERY and the MORNING OF SURGERY with CHG.   2. If you chose to wash your hair, wash your hair first as usual with your normal shampoo.  3. After you shampoo, rinse your hair and body thoroughly to remove the shampoo.  4. Use CHG as you would any other liquid soap. You can apply CHG directly to the skin and wash gently with a scrungie or a clean washcloth.   5. Apply the CHG Soap to your body ONLY FROM THE NECK DOWN.  Do not use on open wounds or open sores. Avoid contact with your eyes, ears, mouth and genitals (private parts). Wash Face and genitals (private parts)  with your normal soap.  6. Wash thoroughly, paying special attention to the area where your surgery will be performed.  7. Thoroughly rinse your body with warm water from the neck down.  8. DO NOT shower/wash with your normal soap after using and rinsing off the CHG Soap.  9. Pat yourself dry with a CLEAN TOWEL.  10. Wear CLEAN PAJAMAS to bed the night before surgery, wear comfortable clothes the morning of surgery  11. Place CLEAN SHEETS on your bed the night of your first shower and DO NOT SLEEP WITH PETS.  Day of Surgery: Do not apply any deodorants/lotions. Please wear clean clothes to the hospital/surgery center.    Please read over the following fact sheets that you were given. Pain Booklet, Coughing and Deep Breathing, Total Joint Packet, MRSA Information and Surgical Site Infection  Prevention

## 2017-08-21 NOTE — Progress Notes (Signed)
PCP - Dr. Eldridge Abrahams- [facility]  Cardiologist - Dr. Marlou Porch-   Chest x-ray - 01/11/18 (E)  EKG - 06/03/17 (E)  Stress Test - 07/30/05 (E)  ECHO - 01/12/17 (E)  Cardiac Cath - Denies  Sleep Study - Yes- Positive CPAP - Yes  LABS- 08/21/17: CBC w/D, BMP, PT, PTT, T/S, UA  Anesthesia- Yes- History and previous consult  Pt denies having chest pain, sob, or fever at this time. All instructions explained to the pt, with a verbal understanding of the material. Pt agrees to go over the instructions while at home for a better understanding. The opportunity to ask questions was provided.

## 2017-08-24 ENCOUNTER — Encounter (HOSPITAL_COMMUNITY): Payer: Self-pay | Admitting: Vascular Surgery

## 2017-08-24 NOTE — Progress Notes (Signed)
Anesthesia Chart Review: Patient is a 62 year old female scheduled for right TKA on 08/31/17 by Dr. Frederik Pear.   History includes never smoker, chronic respiratory failure (home O2, 4L/[place]), HTN, HLD, GERD, hiatal hernia, esophageal dilatation, arthritis, chronic cystitis, recurrent UTI's, axillary abscess (MRSA) '15, CKD stage III, OSA, TIAs ('80, '08, '12), borderline DM, anxiety, dyspnea, migraines, cystoscopy (left ureteral stent 02/01/16; right double J stent 11/21/14, 06/05/14, and 04/21/14), rectocele repair 02/20/14, anterior vaginal vault repair 12/01/4, cholecystectomy 02/10/17, recurrent diverticulitis (s/p open sigmoid colectomy 06/11/17). BMI is consistent with obesity.  - Admission for recurrent diverticulitis 06/02/17-12/12/189. S/p open sigmoid colectomy 06/11/17. Urology consulted for possible right hydronephrosis, but thought most likely from pelvic inflammation and no urologic stent recommended. Discharged home on 4L/[place] (ususal regimen).  ANESTHESIA HISTORY: 02/10/17 (cholecystectomy, GETA) & 06/11/17 (open sigmoid colectomy, GETA): Extubated post-op, O2/[place]. No known anesthesia complications.   02/20/14 (posterior pelvic floor repair): Intra-op began coughing in Trendelenburg position, small amount secretions coming up in LMA. Sats 100%. Patient suctioned, LMA removed and ETT placed. Lungs with wheezing and rhonchi noted, improved after indotracheal albuterol. In PACU difficultly maintaining saturation and placed on BiPAP. RUL airspace opacity on CXR, possible mucous aspiration. PCCM consulted. O2 for sats < 92%. CPAP at HS. Out-patient f/u CXR.    06/06/13 (anterior vaginal vault repair): Post-op respiratory failure, acute on chronic. Hospitalist consulted. Hypoxia-in pt with OSA and obesity hypoventilation s/p surgery with periop sedatives and pain meds/opiates possibly contributing to resp suppression leading to increased desatting with sleep. CXR showed bronchitic changes s/p Levofloxacin.  CTA negative for PE. Noted patient was a mouth breather and used only the [place] and not CPAP overnight.   "some issues if Propofol given to rapidly"  sister died following general anesthesia "due to undiagnosed OSA"  - PCP is listed as Eldridge Abrahams, NP. - Pulmonologist is Dr. Chesley Mires. Last saw Dr. Christinia Gully on 01/29/17. He wrote: "Even on 2lpm and approp cpap 8 titration 7/198 still desaturating so needs 4lpm and repeat ono on 4lpm cpap and referral back to Dr Halford Chessman for longterm f/u." - Cardiologist is Dr. Marlou Porch, new 01/2017. He saw during 01/2017 hospitalization for mildly reduced EF and hypokinesis on echo. Coronary CTA showed no CAD. No further cardiology follow-up recommended. (Previously saw Dr. Jenkins Rouge 07/18/14 for chest pain.) Last notation was from Truitt Merle, NP on 05/05/17 (medication adjustment due to elevated BP 150/100 during her husband's visit with Cecille Rubin).   - GI is Dr. Renelda Loma Armbruster.  Meds include amlodipine, atorvastatin, chlorpheniramine, Klonopin, Lasix, Toprol XL, Prilosec, oxygen, promethazine, Topamax.   BP (!) 146/89   Pulse 86   Temp 36.8 C   Resp 18   Ht 5\' 2"  (1.575 m)   Wt 214 lb 14.4 oz (97.5 kg)   SpO2 95%   BMI 39.31 kg/m  She is on O2 4L/[place].  EKG 06/03/17: SR, borderline intraventricular conduction delay.   CT coronary 01/14/17: IMPRESSION: 1) Normal right dominant coronary arteries 2) Calcium score 0  Echo 01/12/17: Study Conclusions - Left ventricle: The cavity size was normal. Wall thickness was increased in a pattern of mild LVH. Systolic function was mildly reduced. The estimated ejection fraction was in the range of 45% to 50%. Diffuse hypokinesis. Doppler parameters are consistent with abnormal left ventricular relaxation (grade 1 diastolic dysfunction). Impressions: - Mild global reduction in LV systolic function; mild diastolic dysfunction; trace MR and TR.  Carotid U/S 06/02/17: Impression: RICA  velocities suggest 1-39% stenosis. LICA  is patent without evidence of disease.  CXR 01/11/17: IMPRESSION: 1. Mild enlargement of the cardiopericardial silhouette, without edema. 2. Thoracic spondylosis.  PFT (post BD) 09/14/12: FVC 2.40 (75%), FEV1 1.88 (78%), FEV1/FVC% 78, TLC 3.95 (80%), DLCO 92%. Insignificant response to BD.  Preoperative labs noted. Cr 1.04. Glucose 116. CBC, PT/PTT WNL. LFTS WNL 08/06/17. A1c 5.6 on 06/10/17.   Pulmonary and anesthesia history noted. She does have OSA and uses home O2 (4L). She underwent two general surgery procedures under general anesthesia within the last seven months without apparent anesthesia complications or acute cardiopulmonary issues. Case is posted for spinal anesthesia. Anesthesiologist to evaluate and discuss definitive anesthesia plan with patient on the day of surgery.  [facility]/Anesthesiology Phone [phone] 08/24/2017 2:51 PM

## 2017-08-28 DIAGNOSIS — M1711 Unilateral primary osteoarthritis, right knee: Secondary | ICD-10-CM | POA: Diagnosis present

## 2017-08-28 MED ORDER — BUPIVACAINE LIPOSOME 1.3 % IJ SUSP
20.0000 mL | Freq: Once | INTRAMUSCULAR | Status: DC
  Filled 2017-08-28: qty 20

## 2017-08-28 MED ORDER — TRANEXAMIC ACID 1000 MG/10ML IV SOLN
1000.0000 mg | INTRAVENOUS | Status: DC
  Filled 2017-08-28: qty 10

## 2017-08-28 MED ORDER — TRANEXAMIC ACID 1000 MG/10ML IV SOLN
2000.0000 mg | INTRAVENOUS | Status: DC
  Filled 2017-08-28: qty 20

## 2017-08-28 NOTE — H&P (Signed)
TOTAL KNEE ADMISSION H&P  Patient is being admitted for right total knee arthroplasty.  Subjective:  Chief Complaint:right knee pain.  HPI: Ann [name], 62 y.o. female, has a history of pain and functional disability in the right knee due to arthritis and has failed non-surgical conservative treatments for greater than 12 weeks to includeNSAID's and/or analgesics, corticosteriod injections, use of assistive devices, weight reduction as appropriate and activity modification.  Onset of symptoms was gradual, starting several years ago with gradually worsening course since that time. The patient noted no past surgery on the right knee(s).  Patient currently rates pain in the right knee(s) at 10 out of 10 with activity. Patient has night pain and worsening of pain with activity and weight bearing.  Patient has evidence of periarticular osteophytes and joint space narrowing by imaging studies.   There is no active infection.  Patient Active Problem List   Diagnosis Date Noted  . Abnormal CT scan, colon   . HLD (hyperlipidemia) 06/03/2017  . 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 GERD (gastroesophageal reflux disease) 06/03/2017  . Anxiety 06/03/2017  . Chronic combined systolic (congestive) and diastolic (congestive) heart failure ([place]) 06/03/2017  . Hypokalemia 06/03/2017  . Diverticulitis large intestine 06/03/2017  . Lower abdominal pain 06/03/2017  . Non-intractable vomiting with nausea   . Abnormal CT of the abdomen   . Generalized abdominal pain   . LFT elevation   . Diverticulitis of colon 05/18/2017  . Symptomatic cholelithiasis 02/10/2017  . Hypoxia 01/11/2017  . Dyspnea 01/11/2017  . Dysphagia 04/04/2015  . History of esophageal stricture 04/04/2015  . Morbid (severe) obesity due to excess calories ([place]) 04/01/2015  . ACE-inhibitor cough 03/12/2015  . Upper airway cough syndrome 03/12/2015  . Laryngopharyngeal reflux (LPR) 03/12/2015  . Elevated lipids 07/18/2014  . Chest pain  07/18/2014  . Internal hemorrhoids 04/25/2014  . Ureteral stone with hydronephrosis 04/22/2014  . Post-op pain 04/21/2014  . Rectocele, grade 3 02/20/2014  . Female rectocele without uterine prolapse 08/04/2013  . Unspecified constipation 07/23/2013  . Chronic respiratory failure with hypoxia ([place]) 06/07/2013  . Cystocele 06/06/2013  . Anosmia 01/25/2013  . Unspecified nutritional deficiency   . Polyneuropathy in other diseases classified elsewhere ([place])   . OSA (obstructive sleep apnea)   . Cough 07/27/2012  . Essential hypertension    Past Medical History:  Diagnosis Date  . Adenoma of colon   . Allergy    seasonal  . Anemia    with past pregnancy -not recent  . Anxiety   . Arthritis   . Borderline diabetes   . Chronic cystitis   . Complication of anesthesia    02-20-2014 ([facility]) INTRA-OP RESPIRATORY FAILURE SECONDARY TO POSSIBLE MUCOUS ASPIRATION/  ALSO 06-06-2013 [facility]) POST-OP DESATURATION  EVEN USING CPAP, States some issues if Propofol given to rapidly.  . Diverticulitis   . Diverticulosis of colon   . Dyspnea   . Family history of adverse reaction to anesthesia    PER PT SISTER DIED AFTER GENERAL ANESTHESIA DUE TO UNDIAGNOSED OSA  . GERD (gastroesophageal reflux disease)   . H/O hiatal hernia   . Headache(784.0)    migraines, decreased since turning 50's  . History of chronic cough    "tight sounding cough"  . History of esophageal dilatation   . History of kidney stones   . History of MRSA infection    3/ 2015  AXILLARY ABSCESS  . History of recurrent UTIs   . History of TIAs NO RESIDUAL  1980;  2005;   2008 PT STATES PER CT  SCARRING RIGHT SIDE OF BRAIN  . Hyperlipidemia   . Hypertension   . Kidney disease    III  . Neuromuscular disorder ([place])    HH  . Neuropathy    pt denies this 07-05-2015  . Nocturia   . OSA on CPAP    PER SLEEP STUDY 09-08-2012  MODERATE OSA. not used in awhile, but thinks needs to start back using due to some weight gain.  .  Osteoarthritis of knee    Right  . Sleep apnea   . Stroke [facility]) [phone]   TIA'S  . SUI (stress urinary incontinence, female)     Past Surgical History:  Procedure Laterality Date  . ANTERIOR AND POSTERIOR REPAIR N/A 06/06/2013   Procedure: Anterior vaginal vault repair, Sacrospinous ligament fixation with UPHOLD lite, Kelly plication, Sacrospinous mesh fixation, Solyx transurethral sling;  Surgeon: Ailene Rud, MD;  [facility];  Service: Urology;  Laterality: N/A;  . CHOLECYSTECTOMY N/A 02/10/2017   Procedure: LAPAROSCOPIC CHOLECYSTECTOMY;  Surgeon: Coralie Keens, MD;  Location: [place];  Service: General;  Laterality: N/A;  . COLECTOMY WITH COLOSTOMY CREATION/HARTMANN PROCEDURE N/A 06/11/2017   Procedure: OPEN SIGMOID COLECTOMY;  Surgeon: [name], MD;  Location: [facility];  Service: General;  Laterality: N/A;  . CYSTOSCOPY W/ URETERAL STENT PLACEMENT Right 04/21/2014   Procedure: CYSTOSCOPY WITH RETROGRADE PYELOGRAM/URETERAL STENT PLACEMENT;  Surgeon: Ailene Rud, MD;  Location: [facility];  Service: Urology;  Laterality: Right;  . CYSTOSCOPY W/ URETERAL STENT PLACEMENT Right 11/21/2014   Procedure: CYSTOSCOPY WITH RETROGRADE PYELOGRAM, URETEROSCOPY WITH STONE REMOVAL, URETERAL STENT PLACEMENT;  Surgeon: Carolan Clines, MD;  Location: [facility];  Service: Urology;  Laterality: Right;  . CYSTOSCOPY W/ URETERAL STENT PLACEMENT Left 02/01/2016   Procedure: CYSTO, LEFT URETEROSCOPY, LEFT RETROGRADE AND LEFT URETERAL STENT PLACEMENT;  Surgeon: Carolan Clines, MD;  Location: [facility];  Service: Urology;  Laterality: Left;  . CYSTOSCOPY WITH RETROGRADE PYELOGRAM, URETEROSCOPY AND STENT PLACEMENT Right 06/05/2014   Procedure: CYSTOSCOPY WITH RIGHT RETROGRADE PYELOGRAM, RIGHT URETEROSCOPY, STONE EXTRACTION AND RIGHT DOUBLE J STENT PLACEMENT;  Surgeon: Ailene Rud, MD;  Location: [facility];  Service: Urology;  Laterality: Right;   . ESOPHAGEAL DILATION  X2  LAST ONE  JUNE 2014   has had 9 of these  . ESOPHAGEAL MANOMETRY N/A 09/10/2015   Procedure: ESOPHAGEAL MANOMETRY (EM);  Surgeon: Manus Gunning, MD;  [facility];  Service: Gastroenterology;  Laterality: N/A;  . HOLMIUM LASER APPLICATION Right 51/08/5850   Procedure: HOLMIUM LASER OF STONE ;  Surgeon: Ailene Rud, MD;  Location: [facility];  Service: Urology;  Laterality: Right;  . KNEE ARTHROSCOPY Bilateral 2008  . LAPAROSCOPIC CHOLECYSTECTOMY  02/10/2017  . MULTIPLE TOOTH EXTRACTIONS     past hx  . RECTOCELE REPAIR N/A 02/20/2014   Procedure: POSTERIOR REPAIR (RECTOCELE) WITH VAGINAL VAULT REPAIR WITH ACELL GRAFT PLACEMENT, PERINEOPLASTY, ENTEROCELE REPAIR;  Surgeon: Ailene Rud, MD;  Location[facility];  Service: Urology;  Laterality: N/A;  . TUBAL LIGATION  1987    No current facility-administered medications for this encounter.    Current Outpatient Medications  Medication Sig Dispense Refill Last Dose  . acetaminophen (TYLENOL) 500 MG tablet Take 500-1,000 mg by mouth every 6 (six) hours as needed (for pain.).     Marland Kitchen amLODipine (NORVASC) 10 MG tablet Take 10 mg by mouth daily.    08/05/2017 at Unknown  time  . atorvastatin (LIPITOR) 10 MG tablet Take 10 mg by mouth at bedtime.   3 08/05/2017 at Unknown time  . chlorpheniramine (CHLOR-TRIMETON) 4 MG tablet Take 1 tablet (4 mg total) by mouth at bedtime. (Patient taking differently: Take 4-8 mg by mouth at bedtime. ) 14 tablet 0 08/05/2017 at Unknown time  . clonazePAM (KLONOPIN) 0.5 MG tablet Take 0.5 mg by mouth 2 (two) times daily as needed for anxiety.    Past Week at Unknown time  . furosemide (LASIX) 40 MG tablet Take 0.5 tablets (20 mg total) by mouth daily. (Patient taking differently: Take 20 mg by mouth daily as needed (for swelling/fluid retention.). )   Past Month at Unknown time  . metoprolol succinate (TOPROL-XL) 50 MG 24 hr tablet Take 50 mg by mouth  at bedtime.   3 08/05/2017 at 800 PM  . omeprazole (PRILOSEC) 40 MG capsule TAKE 1 CAPSULE(40 MG) BY MOUTH TWICE DAILY 120 capsule 0 08/05/2017 at Unknown time  . OXYGEN Inhale 4 L into the lungs See admin instructions. With CPAP continuously  and During Waking Hours as needed   PRN  . polyethylene glycol (MIRALAX / GLYCOLAX) packet Take 17 g by mouth daily as needed for moderate constipation. (Patient taking differently: Take 17 g by mouth daily as needed for moderate constipation. For constipation.) 14 each 0 Not Taking at Unknown time  . promethazine (PHENERGAN) 12.5 MG tablet Take 1 tablet (12.5 mg total) by mouth every 6 (six) hours as needed for nausea. 30 tablet 0   . topiramate (TOPAMAX) 50 MG tablet Take 50 mg by mouth 2 (two) times daily.   Past Week at Unknown time  . hyoscyamine (LEVBID) 0.375 MG 12 hr tablet Take 1 tablet (0.375 mg total) by mouth every 12 (twelve) hours as needed for cramping. (Patient not taking: Reported on 08/06/2017) 60 tablet 0 Not Taking at Unknown time  . oxyCODONE (ROXICODONE) 5 MG immediate release tablet Take 1 tablet (5 mg total) by mouth every 4 (four) hours as needed for severe pain. (Patient not taking: Reported on 08/13/2017) 15 tablet 0 Not Taking at Unknown time  . UNABLE TO FIND Med Name: CPAP   08/05/2017 at Unknown time  . zolpidem (AMBIEN) 5 MG tablet Take 1 tablet (5 mg total) by mouth at bedtime as needed for sleep. (Patient not taking: Reported on 08/06/2017) 30 tablet 0 Not Taking at Unknown time   Allergies  Allergen Reactions  . Lisinopril Cough  . Doxycycline Diarrhea    Severe headaches  . Metronidazole Other (See Comments)    Severe headaches BUT CAN BE TOLERATED    Social History   Tobacco Use  . Smoking status: Never Smoker  . Smokeless tobacco: Never Used  Substance Use Topics  . Alcohol use: No    Alcohol/week: 0.0 oz    Family History  Problem Relation Age of Onset  . Colon cancer Father 60       died at 29  . Heart disease  Mother        died at 59  . Heart attack Mother   . Heart attack Sister        died at 22  . Hypertension Brother   . Heart attack Cousin 67       with death  . Heart attack Cousin 45       died with MI  . Esophageal cancer Neg Hx   . Rectal cancer Neg Hx   . Stomach  cancer Neg Hx   . Colon polyps Neg Hx      Review of Systems  Constitutional: Positive for malaise/fatigue.  HENT: Negative.   Eyes: Negative.   Respiratory: Negative.   Cardiovascular: Negative.        HTN  Gastrointestinal:       Rectal bleeding  Genitourinary: Negative.   Musculoskeletal: Positive for joint pain.  Skin: Negative.   Neurological: Positive for headaches.  Endo/Heme/Allergies: Negative.   Psychiatric/Behavioral: Negative.     Objective:  Physical Exam  Constitutional: She is oriented to person, place, and time. She appears well-developed and well-nourished.  HENT:  Head: Normocephalic and atraumatic.  Eyes: Pupils are equal, round, and reactive to light.  Neck: Normal range of motion. Neck supple.  Cardiovascular: Intact distal pulses.  Respiratory: Effort normal.  Musculoskeletal: She exhibits tenderness.  Inspection of right knee reveals no obvious deformity or muscle atrophy, though she does have 10 of varus involving her right knee.  She is tender to palpation along the medial joint line of the right knee.  Range of motion is from 5 to 90 on the right side.  She does not have any warmth, erythema, ecchymosis.  She does have slight suprapatellar effusion.  Strength is 5/5 in bilateral lower extremity's, sensation 2+ bilateral lower extremities  Neurological: She is alert and oriented to person, place, and time.  Skin: Skin is warm and dry.  Psychiatric: She has a normal mood and affect. Her behavior is normal. Judgment and thought content normal.    Vital signs in last 24 hours:    Labs:   Estimated body mass index is 39.31 kg/m as calculated from the following:   Height as  of 08/21/17: 5\' 2"  (1.575 m).   Weight as of 08/21/17: 97.5 kg (214 lb 14.4 oz).   Imaging Review Plain radiographs demonstrate  AP and lateral views of the right knee are taken and reviewed in office today.  This does show severe arthritis of the medial compartment and end-stage arthritis of the patellofemoral compartment with large periarticular osteophyte formation.  Assessment/Plan:  End stage arthritis, right knee   The patient history, physical examination, clinical judgment of the provider and imaging studies are consistent with end stage degenerative joint disease of the right knee(s) and total knee arthroplasty is deemed medically necessary. The treatment options including medical management, injection therapy arthroscopy and arthroplasty were discussed at length. The risks and benefits of total knee arthroplasty were presented and reviewed. The risks due to aseptic loosening, infection, stiffness, patella tracking problems, thromboembolic complications and other imponderables were discussed. The patient acknowledged the explanation, agreed to proceed with the plan and consent was signed. Patient is being admitted for inpatient treatment for surgery, pain control, PT, OT, prophylactic antibiotics, VTE prophylaxis, progressive ambulation and ADL's and discharge planning. The patient is planning to be discharged home with home health services.

## 2017-08-31 ENCOUNTER — Encounter (HOSPITAL_COMMUNITY): Admission: RE | Payer: Self-pay | Source: Ambulatory Visit

## 2017-08-31 ENCOUNTER — Inpatient Hospital Stay (HOSPITAL_COMMUNITY): Admission: RE | Admit: 2017-08-31 | Payer: Medicaid Other | Source: Ambulatory Visit | Admitting: Orthopedic Surgery

## 2017-08-31 SURGERY — ARTHROPLASTY, KNEE, TOTAL
Anesthesia: Spinal | Laterality: Right

## 2017-09-01 LAB — BPAM RBC
BLOOD PRODUCT EXPIRATION DATE: 201903102359
Blood Product Expiration Date: 201903102359
UNIT TYPE AND RH: 600
Unit Type and Rh: 600

## 2017-09-01 LAB — TYPE AND SCREEN
ABO/RH(D): A NEG
ANTIBODY SCREEN: POSITIVE
DONOR AG TYPE: NEGATIVE
Donor AG Type: NEGATIVE
PT AG Type: NEGATIVE
UNIT DIVISION: 0
Unit division: 0
Weak D: POSITIVE

## 2017-11-05 ENCOUNTER — Telehealth: Payer: Self-pay | Admitting: Internal Medicine

## 2017-11-05 NOTE — Telephone Encounter (Signed)
Pt requesting an order for a poc and new cannulas.  Pt has not been seen since 01/2017, and per insurance would need to be seen and qualified for a POC within 30 days of order.  Scheduled OV with MW on 5/7 for evaluation.  Nothing further needed at this time.

## 2017-11-10 ENCOUNTER — Ambulatory Visit: Payer: Medicaid Other | Admitting: Internal Medicine

## 2017-11-13 ENCOUNTER — Ambulatory Visit: Payer: Medicaid Other | Admitting: Internal Medicine

## 2017-11-13 ENCOUNTER — Encounter: Payer: Self-pay | Admitting: Internal Medicine

## 2017-11-13 VITALS — BP 130/90 | HR 76 | Ht 62.0 in | Wt 225.6 lb

## 2017-11-13 DIAGNOSIS — G4733 Obstructive sleep apnea (adult) (pediatric): Secondary | ICD-10-CM | POA: Diagnosis not present

## 2017-11-13 DIAGNOSIS — R05 Cough: Secondary | ICD-10-CM

## 2017-11-13 DIAGNOSIS — R058 Other specified cough: Secondary | ICD-10-CM

## 2017-11-13 NOTE — Patient Instructions (Signed)
Keep up your appts with Dr Halford Chessman for sleep medicine   Pulmonary follow up is as needed

## 2017-11-13 NOTE — Progress Notes (Signed)
Subjective:     Patient ID: Ann [name], female   DOB: 1956/04/08,    MRN: [id number]     Brief patient profile:  62 yowm never smoker with whooping cough as child age 62 and ever since then once or twice a year severe cough and fine in between episodes but gradually pattern changed to where cough continually with improved some with  treatment but never better since " I just can't tell ya"  referred to pulmonary clinic 03/30/2015 for clearance for surgery cc worse cough x 01/2015 worst ever. Seen By Dr Halford Chessman 03/08/15 for cough rec Try NeilMed sinus rinse daily Flonase two sprays each nostril daily Sip water when you have urge to cough Salt water gargles once or twice per day Avoid excessive talking Chlorpheniramine 4 mg nightly  Studies done to date  TESTS: PFT 10/01/12 >> FEV1 1.88 (78%), FEV1% 78, TLC 3.95 (80%), DLCO 92%, no BD   03/30/2015  extended ov/Anora Schwenke re: preop clearance / severe cough and severe obesity  Chief Complaint  Patient presents with  . Advice Only    Pt states needing pulmonary clearance for rectocele surgery. She states that her cough is unchanged or may be worse since last visit.   In mid July 2016 she had an episode in which she thinks she aspirated a piece of a pretzel. This caused coughing and gagging. And persisting assoc with dysphagia > GI eval in progress.  No better with cough drops.  Was intermittently on acei > took last dose ? One day prior to OV   rec nexium 30- 60 min before bfast and prilosec 40 mg  Before supper and pepcid 20 mg at bedtime for now  For drainage / throat tickle try take CHLORPHENIRAMINE  4 mg - take  Up to 2  every 4 hours as needed - available over the counter- may cause drowsiness so start with just a bedtime dose or two and see how you tolerate it before trying in daytime   Take delsym two tsp every 12 hours and supplement if needed with  tramadol 50 mg up to 2 every 4 hours .  GERD diet  Use the flutter every single time you  cough Please schedule a follow up office visit in 2 weeks, sooner if needed with all meds in hand > did not bring it  Late add bystolic 10 mg daily x 2 week sample given.   04/05/15 EGD with dilation   04/13/2015  Extended f/u ov/Ann [name] re: severe cough ? All uacs/onset July 2016 p choking on food ? While on ACEi Chief Complaint  Patient presents with  . Follow-up    Pt states her cough has improved some. No new co's today.   not following instructions as documented above.  Cough remains dry and nonproductive  and mostly daytime rec Nexium 30- 60 min before bfast and prilosec 40 mg  Before supper and pepcid 20 mg at bedtime for now  For drainage / throat tickle try take CHLORPHENIRAMINE  4 mg - take  Up to 2  every 4 hours as needed - available over the counter- may cause drowsiness so start with just a bedtime dose or two and see how you tolerate it before trying in daytime   Take delsym two tsp every 12 hours and supplement if needed with  tramadol 50 mg up to 2 every 4 hours    GERD (REFLUX) diet  Use the flutter every single time you cough Please schedule  a follow up office visit in 2 weeks, sooner if needed with all meds in hand    04/27/2015  f/u ov/Ann [name] re: chronic cough/ no flutter on hand / f/u also hbp   Chief Complaint  Patient presents with  . Follow-up    Pt states that her cough has pretty much resolved.     Still cough when using voice a lot, off tramadol x one week / using otc ppi's now before bfast and lunch and pepcid at hs   rec Continue bystolic until you see Eldridge Abrahams and she can take over on your blood pressure management  Don't use tramadol more 5 straight days  Always keep the flutter handy and cough into it to prevent airway trauma     Admit date: 01/11/2017 Discharge date: 01/16/2017  DISCHARGE DIAGNOSES:  Active Problems:   OSA (obstructive sleep apnea)   Severe obesity (BMI >= 40) (HCC)   Hypoxia   Dyspnea   RECOMMENDATIONS FOR OUTPATIENT  FOLLOW UP: 1. General surgery to arrange outpatient appointment to discuss further management of cholelithiasis 2. Will eventually need follow-up with pulmonology 3. Home health arranged. BiPAP has been arranged through home health agency.   DISCHARGE CONDITION: fair  Diet recommendation: Low-fat diet       Filed Weights   01/11/17 1059 01/15/17 0713 01/16/17 0300  Weight: 108.9 kg (240 lb) 111.5 kg (245 lb 13 oz) 115.2 kg (253 lb 15.5 oz)    INITIAL HISTORY: Ann [name] a 62 y.o.femalewith.femalewith multiple medical conditions including anxiety, OSA -- not currently being treated, recurrent UTI's, multiple esophageal dilations done By Dr. Olevia Perches, had apparently some adenomas removed, presented to [facility] with main concern of 3 days duration of progressively worsening RUQ abd pain that is sharp and occasionally radiating to epigastric area. Patient in the ER was found to be hypoxic and work up found normal coronaries and hypoxia most likely from untreated OSA and obesity hypoventilation.   Consultations:  Cardiology  Pulmonology  Phone discussion with Dr. Grandville Silos with general surgery  Procedures: Transthoracic echocardiogram Study Conclusions - Left ventricle: The cavity size was normal. Wall thickness was increased in a pattern of mild LVH. Systolic function was mildly reduced. The estimated ejection fraction was in the range of 45% to 50%. Diffuse hypokinesis. Doppler parameters are consistent with abnormal left ventricular relaxation (grade 1 diastolic dysfunction). Impressions: - Mild global reduction in LV systolic function; mild diastolic dysfunction; trace MR and TR.  HOSPITAL COURSE:   Dyspnea and hypoxia- due to OSA and obesity hypoventilation +/- bronchitis -5L O2 weaned to 2L- meets criteria to go home on O2 during the day and BIPAP at night -patient is caring for dying husband and under significant stress -has had on and of chest pain but  cardiac evaluation is unrevealing: CTA with no CAD -CTA negative for PE - patient was given lasix at admission but has not had significant diuresis and Cr has increased so doubt CHF and lasix was d/c'd - echo shows EF 45-50 and diffuse hypokinesis-- appreciate cardiology'sevaluation- no further work up needed - TSH normal -patient is a never-smoker but has been exposed to 2nd hand smoke -started on azithromycin per pulm x 5 days  Untreated OSA -has not worn CPAP in months -has been seen by pulm and plan to d/c on BIPAP> -being arrange by care management (patient to undergo another sleep study 01/22/17 and read by Dr Annamaria Boots: Trial of CPAP therapy on 8 cm H2O with a Medium size Resmed  Full Face Mask AirFit F20 mask and heated humidification. - Supplemental O2 was continued at 2L/M for this study. - Avoid alcohol, sedatives and other CNS depressants that may worsen sleep apnea and disrupt normal sleep architecture. - Sleep hygiene should be reviewed to assess factors that may improve sleep quality. - Weight management and regular exercise should be initiated or continued.   Headache -?related to untreated sleep apnea -improved  HTN, essential - continue Norvasc and Metoprolol - hold losartan and HCTZ for now - BP controlled  RUQ pain -U/S showed gallstones without cholecystitis. Patient continued to have symptoms with some nausea but was able to tolerate her meals. -HIDA was done this morning which showed patent biliary duct, however, ejection fraction was noted to be less than normal. LFTs were checked today and were normal.  These findings were discussed with Dr. Grandville Silos with general surgery. He recommends a low-fat diet and outpatient follow-up. He will have his office call the patient to schedule appointment. Any surgical intervention in this patient will carry risk due to her frail pulmonary status. So, we would like for her to be optimized some more before any surgery is  planned. No urgent need for surgical intervention at this time.   Morbid obesity - Body mass index is 42.51 kg/m -weight loss options discussed  Hx of kidney stones - recent CT abd noted small stone on left side, nonobstructive - also mild inflammatory changes on right ureter likely from passed stone   Patient also mentioned loose stool. This morning she had solid stool. She was constipated a few days ago. Patient was reassured. She does not have any abdominal tenderness apart from the right upper quadrant.  All of patient's and her daughter's questions were answered. They were both reassured. Okay for discharge home today. Case manager assisting with DME's as well as BiPAP.     01/26/2017  f/u ov/Ann [name] re: post hosp/ transition of care post admit  OHS/osa  Chief Complaint  Patient presents with  . Hospitalization Follow-up    Pt states her breathing has not improved since hospital d/c. She is using o2 2lpm 24/7.   onset of doe 2-3 months room to room not cpap - remotely by Dr Halford Chessman with osa but not seen since 03/08/15 No cpap x 3 years / no 02 either  On 2lpm now sat upper 80's or lowerer 90's on  4lpm walking No better sob with saba / no longer on ppi bid ac and dry cough daytime has recurred Doe - MMRC3 = can't walk 100 yards even at a slow pace at a flat grade s stopping due to sob  Rec Prilosec 40 mg Take 30- 60 min before your first and last meals of the day    Please see patient coordinator before you leave today  to schedule CPAP 8/ 02 4lpm and repeat ono on 4lpm  Wear 02 w 2lpm walking but turn up if needed to keep over 90% your goal  Follow up with Dr Halford Chessman next available     11/13/2017  f/u ov/Ann [name] re: uacs resolved on 1st gen H1 blockers per guidelines  Plus gerd rx  Chief Complaint  Patient presents with  . Follow-up    Pt is requesting a new lighter POC. She has been using her o2 at 4 lpm with sleep and with exertion. She walked 1/5 laps today on RA and sats  stayed 96%ra- stopped due to dizziness.   Dyspnea:  Limited by knees Cough: resolved with  h1 rx Sleep: cpap/ and 4lpm  SABA use:  None               Objective:   Physical Exam  amb obese wf    04/14/2015        221 > 04/27/2015  224 >  01/26/2017  240 > 11/13/2017   225     03/30/15 225 lb (102.059 kg)  03/08/15 223 lb 12.8 oz (101.515 kg)  11/21/14 211 lb (95.709 kg)     Vital signs reviewed - Note on arrival 02 sats  225% on RA        No jvd Oropharynx clear Neck supple Lungs clear bilaterally to A and P  RRR no s3 or or sign murmur Abd  Obese/ soft/ non tender/ limited  excursion on isp Ext wam with no edema or clubbing noted Neuro  Alert/ No motor deficits            Assessment:

## 2017-11-14 ENCOUNTER — Encounter: Payer: Self-pay | Admitting: Internal Medicine

## 2017-11-14 NOTE — Assessment & Plan Note (Signed)
Sleep study 01/22/17 and read by Dr Annamaria Boots: Trial of CPAP therapy on 8 cm H2O with a Medium size Resmed Full Face Mask AirFit F20 mask and heated humidification. - Supplemental O2 was continued at 2L/M for this study > changed to 4lpm 01/26/2017 and rec repeat ono on cpap 8 and 4lpm > done 01/30/17 and no desats   F/u  with Dr Halford Chessman planned

## 2017-11-14 NOTE — Assessment & Plan Note (Signed)
Allergy profile 04/13/2015 >  Eos 0.1  , IgE 93  neg RAST Sinus CT  04/18/2015> Mild chronic appearing sinusitis s a/f levels  - Flutter valve not using 04/27/2015 > encouraged to continue   - rec restart ppi bid ac and 1st gen H1 blockers per guidelines   01/26/2017 >>> controlled well 11/13/2017 > no f/u needed

## 2017-11-14 NOTE — Assessment & Plan Note (Signed)
Body mass index is 41.26 kg/m.  -  trending down/ encouraged  Lab Results  Component Value Date   TSH 1.995 01/11/2017     Contributing to gerd risk/ doe/reviewed the need and the process to achieve and maintain neg calorie balance > defer f/u primary care including intermittently monitoring thyroid status

## 2018-01-08 IMAGING — CT CT RENAL STONE PROTOCOL
2 of 3 series · 16 of 46 positions shown, 18 images · non-contrast
Comparison: 12/02/2016 CT abdomen and pelvis.

CLINICAL DATA: 60 y/o  F; left flank pain radiating to hips legs.

EXAM:
CT ABDOMEN AND PELVIS WITHOUT CONTRAST
TECHNIQUE: Multidetector CT imaging of the abdomen and pelvis was performed
following the standard protocol without IV contrast.

[Series 3: coronal · coronal · 0.92mm/px · 3 of 191 slices shown]
[im 64/191  soft-tissue]
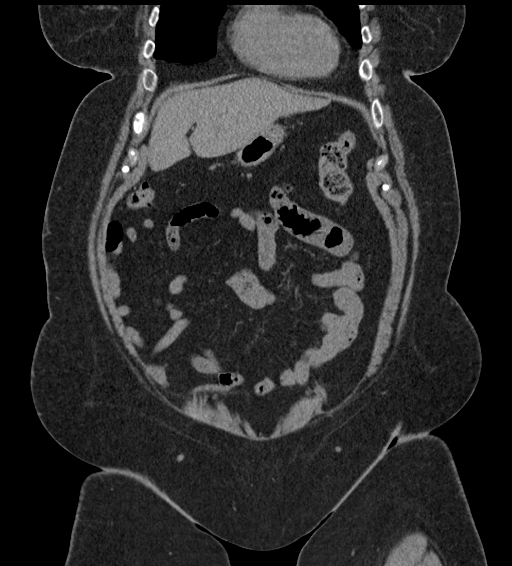
[im 85/191  soft-tissue]
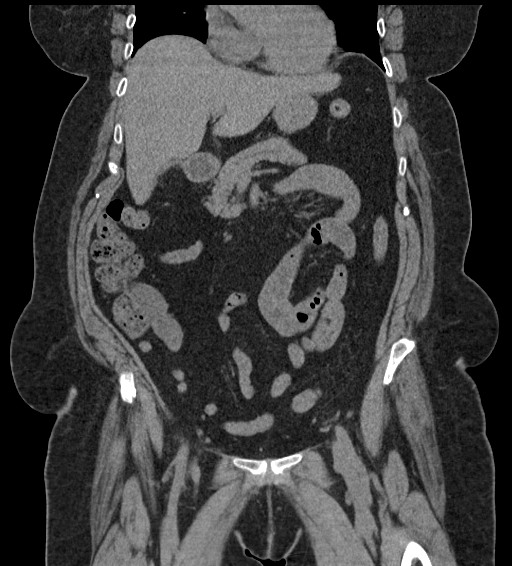
[im 106/191  soft-tissue]
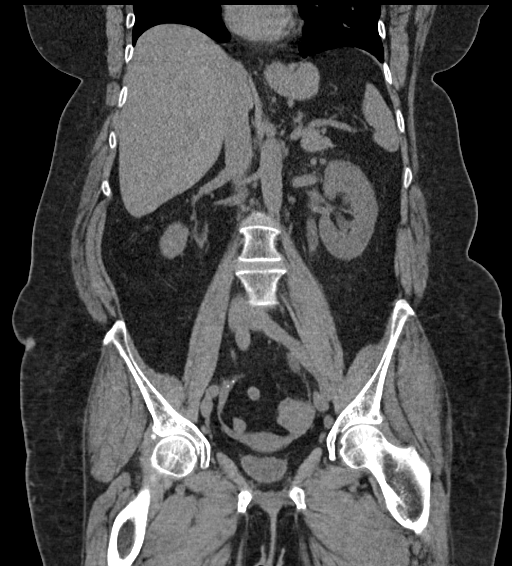

[Series 6: lung · axial · 0.90mm/px · z∈[-115,-3]mm · 13 of 66 slices shown, 15 images]
[im 5/66  soft-tissue]
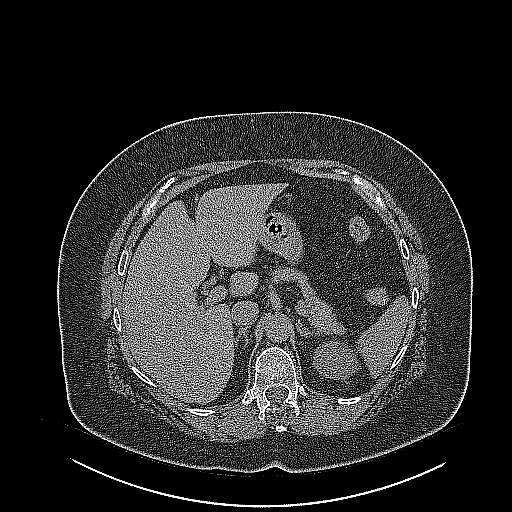
[im 5/66  bone]
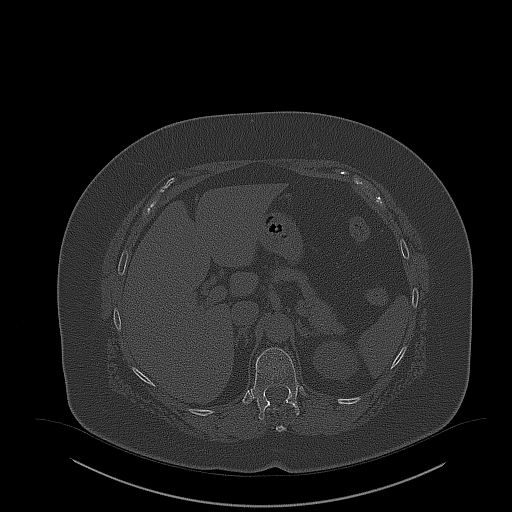
[im 9/66  soft-tissue]
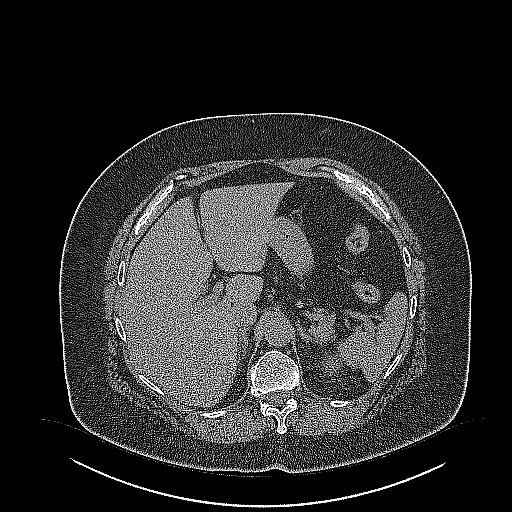
[im 13/66  soft-tissue]
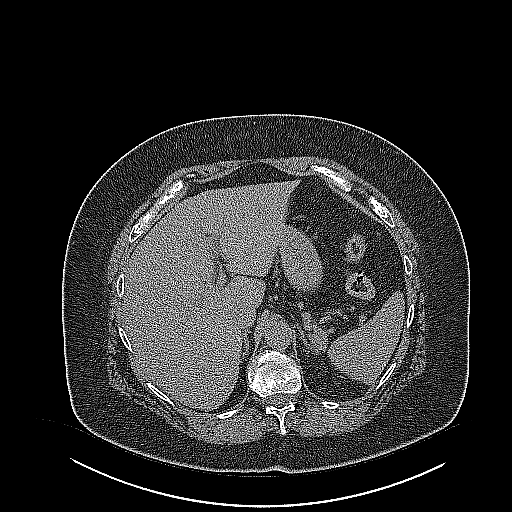
[im 19/66  soft-tissue]
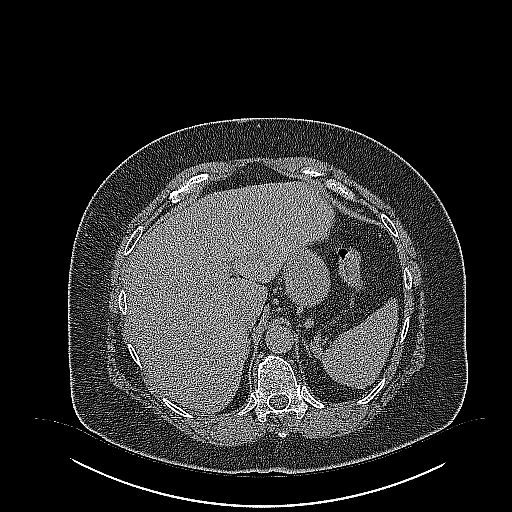
[im 24/66  soft-tissue]
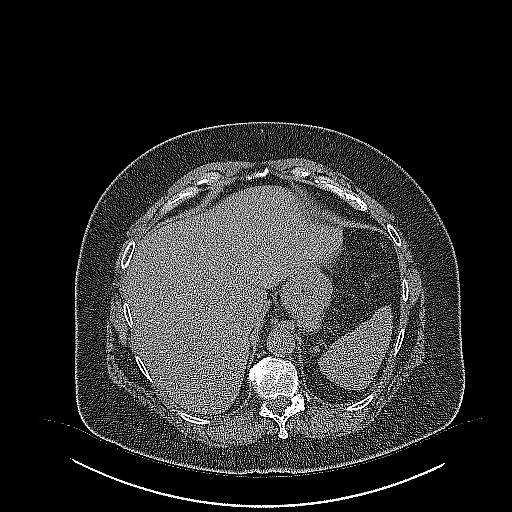
[im 28/66  soft-tissue]
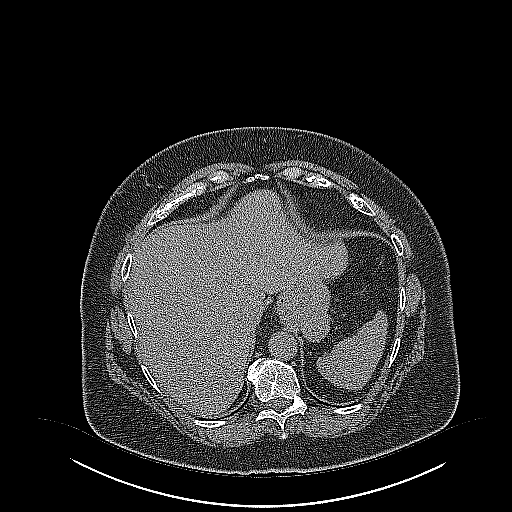
[im 34/66  soft-tissue]
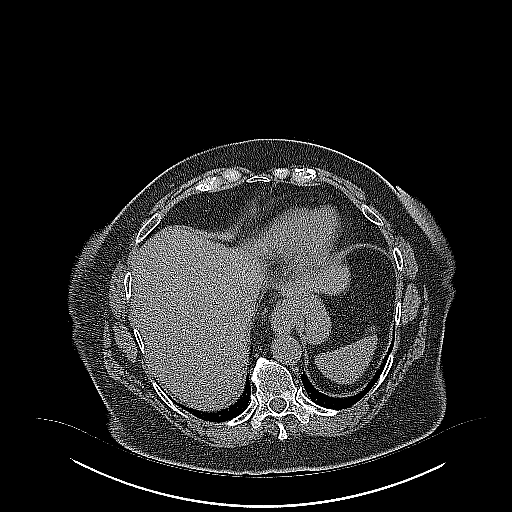
[im 38/66  soft-tissue]
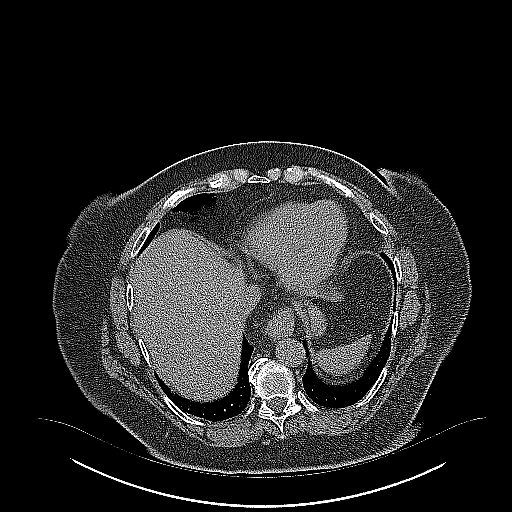
[im 42/66  soft-tissue]
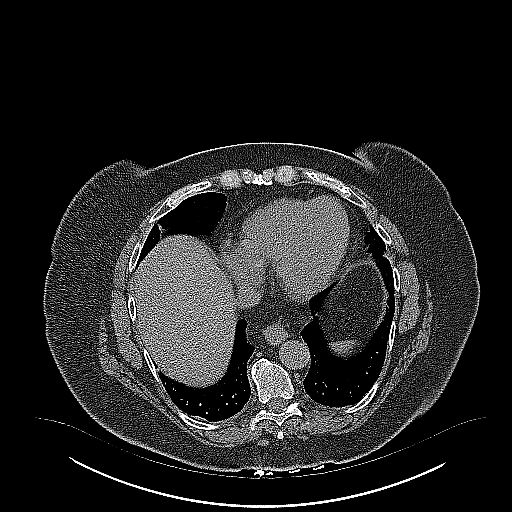
[im 42/66  bone]
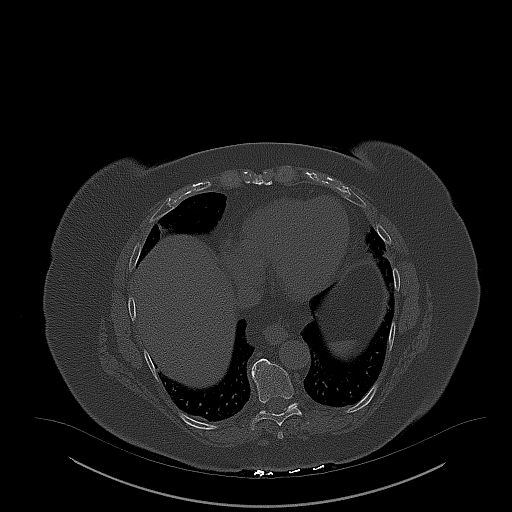
[im 47/66  soft-tissue]
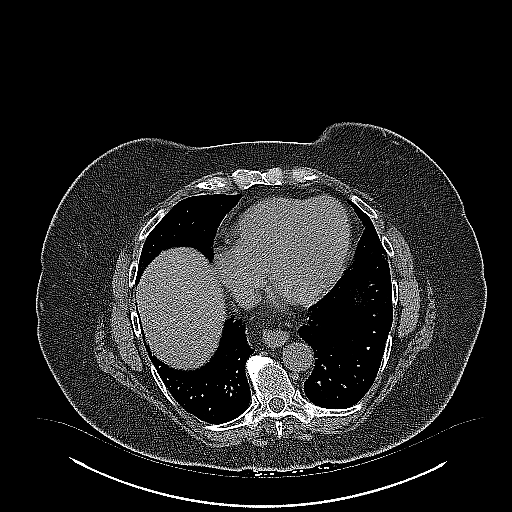
[im 53/66  soft-tissue]
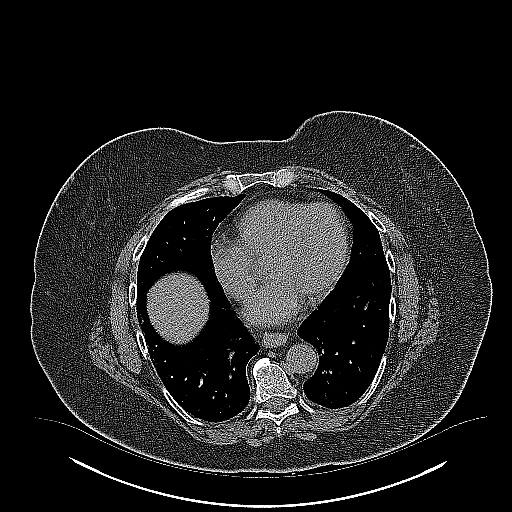
[im 57/66  soft-tissue]
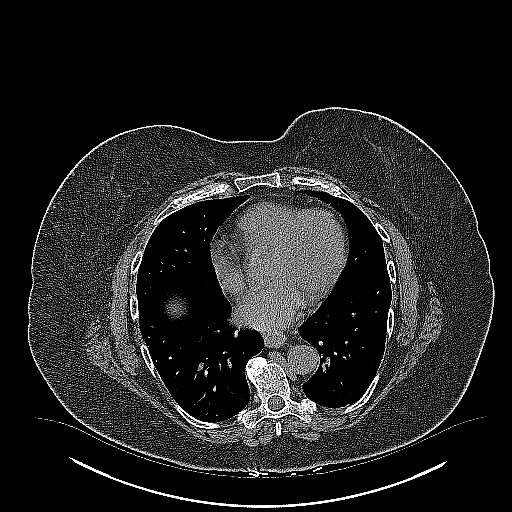
[im 61/66  soft-tissue]
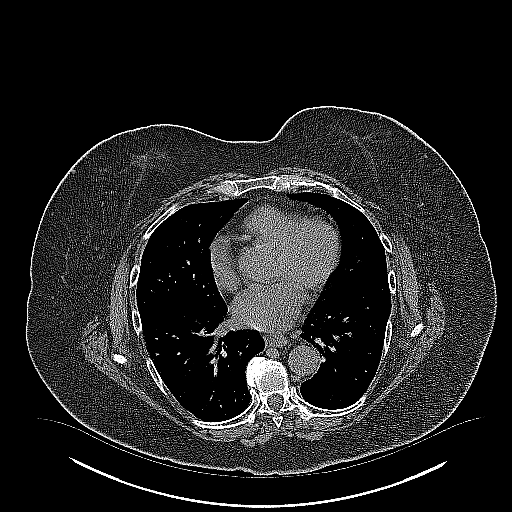

[16 of 46 positions shown; findings below may reference images not displayed]

FINDINGS: Lower chest: No acute abnormality.

Hepatobiliary: No focal liver abnormality is seen. Status post
cholecystectomy. No biliary dilatation.

Pancreas: Unremarkable. No pancreatic ductal dilatation or
surrounding inflammatory changes.

Spleen: Normal in size without focal abnormality.

Adrenals/Urinary Tract: Normal adrenal glands. Mild atrophy of the
right kidney. Mild left hydronephrosis and perinephric stranding
secondary to a obstructing stone within the distal ureter just
proximal to the ureterovesicular junction measuring 5 mm. Normal
bladder.

Stomach/Bowel: Stomach is within normal limits. Appendix appears
normal. No evidence of bowel wall thickening, distention, or
inflammatory changes. Mild sigmoid diverticulosis.

Vascular/Lymphatic: Aortic atherosclerosis. No enlarged abdominal or
pelvic lymph nodes.

Reproductive: Uterus and bilateral adnexa are unremarkable.

Other: No abdominal wall hernia or abnormality. No abdominopelvic
ascites.

Musculoskeletal: No fracture is seen. Moderate degenerative changes
of the spine with prominent lower lumbar facet arthrosis. Grade 1
L4-5 anterolisthesis.
IMPRESSION: 1. Mild left hydronephrosis and perinephric stranding secondary to
obstructing stone in the distal left ureter just proximal to the
ureterovesicular junction measuring up to 5 mm.
2. Post cholecystectomy. Otherwise stable chronic findings as above.

By: Cicero Jose Fernandes M.D.

## 2018-05-11 ENCOUNTER — Other Ambulatory Visit: Payer: Self-pay

## 2018-05-11 ENCOUNTER — Emergency Department (HOSPITAL_COMMUNITY): Payer: Medicaid Other

## 2018-05-11 ENCOUNTER — Encounter (HOSPITAL_COMMUNITY): Payer: Self-pay | Admitting: *Deleted

## 2018-05-11 ENCOUNTER — Emergency Department (HOSPITAL_COMMUNITY)
Admission: EM | Admit: 2018-05-11 | Discharge: 2018-05-11 | Disposition: A | Discharge status: Home / Self Care | Payer: Medicaid Other | Attending: Emergency Medicine | Admitting: Emergency Medicine

## 2018-05-11 DIAGNOSIS — R0789 Other chest pain: Secondary | ICD-10-CM

## 2018-05-11 DIAGNOSIS — E785 Hyperlipidemia, unspecified: Secondary | ICD-10-CM | POA: Diagnosis not present

## 2018-05-11 DIAGNOSIS — R252 Cramp and spasm: Secondary | ICD-10-CM

## 2018-05-11 DIAGNOSIS — M25512 Pain in left shoulder: Secondary | ICD-10-CM | POA: Insufficient documentation

## 2018-05-11 DIAGNOSIS — N183 Chronic kidney disease, stage 3 (moderate): Secondary | ICD-10-CM | POA: Diagnosis not present

## 2018-05-11 DIAGNOSIS — Z79899 Other long term (current) drug therapy: Secondary | ICD-10-CM | POA: Diagnosis not present

## 2018-05-11 DIAGNOSIS — I129 Hypertensive chronic kidney disease with stage 1 through stage 4 chronic kidney disease, or unspecified chronic kidney disease: Secondary | ICD-10-CM | POA: Insufficient documentation

## 2018-05-11 DIAGNOSIS — G44219 Episodic tension-type headache, not intractable: Secondary | ICD-10-CM

## 2018-05-11 LAB — POCT I-STAT TROPONIN I
Troponin i, poc: 0 ng/mL (ref 0.00–0.08)
Troponin i, poc: 0 ng/mL (ref 0.00–0.08)

## 2018-05-11 LAB — BASIC METABOLIC PANEL
Anion gap: 7 (ref 5–15)
BUN: 19 mg/dL (ref 6–20)
CALCIUM: 9.2 mg/dL (ref 8.9–10.3)
CO2: 28 mmol/L (ref 22–32)
CREATININE: 0.95 mg/dL (ref 0.44–1.00)
Chloride: 105 mmol/L (ref 98–111)
GFR calc Af Amer: >60 mL/min (ref >60)
GFR calc non Af Amer: >60 mL/min (ref >60)
GLUCOSE: 128 mg/dL — AB (ref 70–99)
Potassium: 4.1 mmol/L (ref 3.5–5.1)
Sodium: 140 mmol/L (ref 135–145)

## 2018-05-11 LAB — CBC
HCT: 43.2 % (ref 36.0–46.0)
Hemoglobin: 13.5 g/dL (ref 12.0–15.0)
MCH: 32.1 pg (ref 26.0–34.0)
MCHC: 31.3 g/dL (ref 30.0–36.0)
MCV: 102.9 fL — AB (ref 80.0–100.0)
PLATELETS: 295 10*3/uL (ref 150–400)
RBC: 4.2 MIL/uL (ref 3.87–5.11)
RDW: 13.6 % (ref 11.5–15.5)
WBC: 6 10*3/uL (ref 4.0–10.5)
nRBC: 0 % (ref 0.0–0.2)

## 2018-05-11 MED ORDER — DEXAMETHASONE SODIUM PHOSPHATE 10 MG/ML IJ SOLN
10.0000 mg | Freq: Once | INTRAMUSCULAR | Status: AC
  Administered 2018-05-11: 10 mg via INTRAVENOUS
  Filled 2018-05-11: qty 1

## 2018-05-11 MED ORDER — PROCHLORPERAZINE EDISYLATE 10 MG/2ML IJ SOLN
10.0000 mg | Freq: Once | INTRAMUSCULAR | Status: AC
  Administered 2018-05-11: 10 mg via INTRAVENOUS
  Filled 2018-05-11: qty 2

## 2018-05-11 MED ORDER — KETOROLAC TROMETHAMINE 15 MG/ML IJ SOLN
15.0000 mg | Freq: Once | INTRAMUSCULAR | Status: AC
  Administered 2018-05-11: 15 mg via INTRAVENOUS
  Filled 2018-05-11: qty 1

## 2018-05-11 MED ORDER — ACETAMINOPHEN 500 MG PO TABS
1000.0000 mg | ORAL_TABLET | Freq: Once | ORAL | Status: AC
  Administered 2018-05-11: 1000 mg via ORAL
  Filled 2018-05-11: qty 2

## 2018-05-11 MED ORDER — DIPHENHYDRAMINE HCL 50 MG/ML IJ SOLN
25.0000 mg | Freq: Once | INTRAMUSCULAR | Status: AC
  Administered 2018-05-11: 25 mg via INTRAVENOUS
  Filled 2018-05-11 (×2): qty 1

## 2018-05-11 NOTE — ED Triage Notes (Signed)
Pt states she is having chest pain, rt shoulder pain and left lower leg pain.

## 2018-05-11 NOTE — ED Provider Notes (Signed)
[facility] DEPT Provider Note  CSN: [id number] Arrival date & time: 05/11/18 0024  Chief Complaint(s) Chest Pain; Leg Pain; and Shoulder Pain  HPI Ann [name] is a 62 y.o. female with a past medical history listed below presents to the emergency department with several days of intermittent cramping right upper chest/shoulder pain as well as left shoulder and left calf pain.  She reports that her pain alternates between these regions and sometimes multiple at once.  States that the symptoms come on sporadically and resolve sporadically.  At times lasting minutes to hours (max of 2 hours).  Chest pain is not exertional or pleuritic. Worse with movement and palpation of the area. There is no associated shortness of breath.  No nausea or vomiting.  No recent fevers or infections.  Patient denies any abdominal pain.  No focal deficits.  Currently pain free.  HPI  Past Medical History Past Medical History:  Diagnosis Date  . Adenoma of colon   . Allergy    seasonal  . Anemia    with past pregnancy -not recent  . Anxiety   . Arthritis   . Borderline diabetes   . Chronic cystitis   . Complication of anesthesia    02-20-2014 ([facility]) INTRA-OP RESPIRATORY FAILURE SECONDARY TO POSSIBLE MUCOUS ASPIRATION/  ALSO 06-06-2013 [facility]) POST-OP DESATURATION  EVEN USING CPAP, States some issues if Propofol given to rapidly.  . Diverticulitis   . Diverticulosis of colon   . Dyspnea   . Family history of adverse reaction to anesthesia    PER PT SISTER DIED AFTER GENERAL ANESTHESIA DUE TO UNDIAGNOSED OSA  . GERD (gastroesophageal reflux disease)   . H/O hiatal hernia   . Headache(784.0)    migraines, decreased since turning 50's  . History of chronic cough    "tight sounding cough"  . History of esophageal dilatation   . History of kidney stones   . History of MRSA infection    3/ 2015  AXILLARY ABSCESS  . History of recurrent UTIs   . History of TIAs NO RESIDUAL   1980;  2005;   2008 PT STATES PER CT  SCARRING RIGHT SIDE OF BRAIN  . Hyperlipidemia   . Hypertension   . Kidney disease    III  . Neuromuscular disorder ([place])    HH  . Neuropathy    pt denies this 07-05-2015  . Nocturia   . OSA on CPAP    PER SLEEP STUDY 09-08-2012  MODERATE OSA. not used in awhile, but thinks needs to start back using due to some weight gain.  . Osteoarthritis of knee    Right  . Sleep apnea   . Stroke [facility]) [id number]   TIA'S  . SUI (stress urinary incontinence, female)    Patient Active Problem List   Diagnosis Date Noted  . Osteoarthritis of right knee 08/28/2017  . Abnormal CT scan, colon   . HLD (hyperlipidemia) 06/03/2017  . Stroke (cerebrum) ([place]) 06/03/2017  . GERD (gastroesophageal reflux disease) 06/03/2017  . Anxiety 06/03/2017  . Chronic combined systolic (congestive) and diastolic (congestive) heart failure ([place]) 06/03/2017  . Hypokalemia 06/03/2017  . Diverticulitis large intestine 06/03/2017  . Lower abdominal pain 06/03/2017  . Non-intractable vomiting with nausea   . Abnormal CT of the abdomen   . Generalized abdominal pain   . LFT elevation   . Diverticulitis of colon 05/18/2017  . Symptomatic cholelithiasis 02/10/2017  . Hypoxia 01/11/2017  . Dyspnea 01/11/2017  . Dysphagia 04/04/2015  .  History of esophageal stricture 04/04/2015  . Morbid (severe) obesity due to excess calories ([place]) 04/01/2015  . ACE-inhibitor cough 03/12/2015  . Upper airway cough syndrome 03/12/2015  . Laryngopharyngeal reflux (LPR) 03/12/2015  . Elevated lipids 07/18/2014  . Chest pain 07/18/2014  . Internal hemorrhoids 04/25/2014  . Ureteral stone with hydronephrosis 04/22/2014  . Post-op pain 04/21/2014  . Rectocele, grade 3 02/20/2014  . Female rectocele without uterine prolapse 08/04/2013  . Unspecified constipation 07/23/2013  . Chronic respiratory failure with hypoxia ([place]) 06/07/2013  . Cystocele 06/06/2013  . Anosmia 01/25/2013  .  Unspecified nutritional deficiency   . Polyneuropathy in other diseases classified elsewhere ([place])   . OSA (obstructive sleep apnea)   . Cough 07/27/2012  . Essential hypertension    Home Medication(s) Prior to Admission medications   Medication Sig Start Date End Date Taking? Authorizing Provider  acetaminophen (TYLENOL) 325 MG tablet Take 325 mg by mouth every 6 (six) hours as needed for mild pain.   Yes [provider]  amLODipine (NORVASC) 10 MG tablet Take 10 mg by mouth daily.    Yes [provider]  atorvastatin (LIPITOR) 10 MG tablet Take 10 mg by mouth at bedtime.  05/15/17  Yes [provider]  chlorpheniramine (CHLOR-TRIMETON) 4 MG tablet Take 1 tablet (4 mg total) by mouth at bedtime. Patient taking differently: Take 4-8 mg by mouth at bedtime.  03/08/15  Yes Chesley Mires, MD  clonazePAM (KLONOPIN) 0.5 MG tablet Take 0.5 mg by mouth 2 (two) times daily as needed for anxiety.    Yes [provider]  furosemide (LASIX) 40 MG tablet Take 0.5 tablets (20 mg total) by mouth daily. Patient taking differently: Take 20 mg by mouth daily as needed (for swelling/fluid retention.).  05/28/17  Yes Domenic Polite, MD  ibuprofen (ADVIL,MOTRIN) 200 MG tablet Take 200 mg by mouth every 6 (six) hours as needed for moderate pain.   Yes [provider]  losartan (COZAAR) 50 MG tablet Take 50 mg by mouth daily.   Yes [provider]  metoprolol succinate (TOPROL-XL) 50 MG 24 hr tablet Take 50 mg by mouth at bedtime.  08/08/15  Yes [provider]  omeprazole (PRILOSEC) 40 MG capsule TAKE 1 CAPSULE(40 MG) BY MOUTH TWICE DAILY Patient taking differently: Take 40 mg by mouth 2 (two) times daily.  09/17/16  Yes Armbruster, [name], MD  polyethylene glycol (MIRALAX / GLYCOLAX) packet Take 17 g by mouth daily as needed for moderate constipation. Patient taking differently: Take 17 g by mouth daily.  01/16/17  Yes Bonnielee Haff, MD  topiramate  (TOPAMAX) 50 MG tablet Take 50 mg by mouth 2 (two) times daily.   Yes [provider]  zolpidem (AMBIEN) 5 MG tablet Take 1 tablet (5 mg total) by mouth at bedtime as needed for sleep. 06/16/17  Yes Theodis Blaze, MD  hyoscyamine (LEVBID) 0.375 MG 12 hr tablet Take 1 tablet (0.375 mg total) by mouth every 12 (twelve) hours as needed for cramping. Patient not taking: Reported on 05/11/2018 05/28/17   Domenic Polite, MD  oxyCODONE (ROXICODONE) 5 MG immediate release tablet Take 1 tablet (5 mg total) by mouth every 4 (four) hours as needed for severe pain. Patient not taking: Reported on 05/11/2018 08/06/17   Carmin Muskrat, MD  OXYGEN Inhale 4 L into the lungs See admin instructions. With CPAP continuously  and During Waking Hours as needed    [provider]  promethazine (PHENERGAN) 12.5 MG tablet Take  1 tablet (12.5 mg total) by mouth every 6 (six) hours as needed for nausea. Patient not taking: Reported on 05/11/2018 08/06/17   Carmin Muskrat, MD  UNABLE TO FIND Med Name: CPAP    [provider]                                                                                                                                    Past Surgical History Past Surgical History:  Procedure Laterality Date  . ANTERIOR AND POSTERIOR REPAIR N/A 06/06/2013   Procedure: Anterior vaginal vault repair, Sacrospinous ligament fixation with UPHOLD lite, Kelly plication, Sacrospinous mesh fixation, Solyx transurethral sling;  Surgeon: Ailene Rud, MD;  [facility];  Service: Urology;  Laterality: N/A;  . CHOLECYSTECTOMY N/A 02/10/2017   Procedure: LAPAROSCOPIC CHOLECYSTECTOMY;  Surgeon: Coralie Keens, MD;  Location: [facility];  Service: General;  Laterality: N/A;  . COLECTOMY WITH COLOSTOMY CREATION/HARTMANN PROCEDURE N/A 06/11/2017   Procedure: OPEN SIGMOID COLECTOMY;  Surgeon: [name], MD;  Location: [facility];  Service: General;  Laterality: N/A;  .  CYSTOSCOPY W/ URETERAL STENT PLACEMENT Right 04/21/2014   Procedure: CYSTOSCOPY WITH RETROGRADE PYELOGRAM/URETERAL STENT PLACEMENT;  Surgeon: Ailene Rud, MD;  Location: [facility];  Service: Urology;  Laterality: Right;  . CYSTOSCOPY W/ URETERAL STENT PLACEMENT Right 11/21/2014   Procedure: CYSTOSCOPY WITH RETROGRADE PYELOGRAM, URETEROSCOPY WITH STONE REMOVAL, URETERAL STENT PLACEMENT;  Surgeon: Carolan Clines, MD;  Location[facility];  Service: Urology;  Laterality: Right;  . CYSTOSCOPY W/ URETERAL STENT PLACEMENT Left 02/01/2016   Procedure: CYSTO, LEFT URETEROSCOPY, LEFT RETROGRADE AND LEFT URETERAL STENT PLACEMENT;  Surgeon: Carolan Clines, MD;  Location: [facility];  Service: Urology;  Laterality: Left;  . CYSTOSCOPY WITH RETROGRADE PYELOGRAM, URETEROSCOPY AND STENT PLACEMENT Right 06/05/2014   Procedure: CYSTOSCOPY WITH RIGHT RETROGRADE PYELOGRAM, RIGHT URETEROSCOPY, STONE EXTRACTION AND RIGHT DOUBLE J STENT PLACEMENT;  Surgeon: Ailene Rud, MD;  Location: [facility];  Service: Urology;  Laterality: Right;  . ESOPHAGEAL DILATION  X2  LAST ONE  JUNE 2014   has had 9 of these  . ESOPHAGEAL MANOMETRY N/A 09/10/2015   Procedure: ESOPHAGEAL MANOMETRY (EM);  Surgeon: Manus Gunning, MD;  [facility];  Service: Gastroenterology;  Laterality: N/A;  . HOLMIUM LASER APPLICATION Right 70/35/0093   Procedure: HOLMIUM LASER OF STONE ;  Surgeon: Ailene Rud, MD;  Location: [facility];  Service: Urology;  Laterality: Right;  . KNEE ARTHROSCOPY Bilateral 2008  . LAPAROSCOPIC CHOLECYSTECTOMY  02/10/2017  . MULTIPLE TOOTH EXTRACTIONS     past hx  . RECTOCELE REPAIR N/A 02/20/2014   Procedure: POSTERIOR REPAIR (RECTOCELE) WITH VAGINAL VAULT REPAIR WITH ACELL GRAFT PLACEMENT, PERINEOPLASTY, ENTEROCELE REPAIR;  Surgeon: Ailene Rud, MD;  [facility];  Service: Urology;  Laterality: N/A;  . TUBAL LIGATION  1987   Family  History Family History  Problem Relation Age  of Onset  . Colon cancer Father 33       died at 62  . Heart disease Mother        died at 71  . Heart attack Mother   . Heart attack Sister        died at 71  . Hypertension Brother   . Heart attack Cousin 77       with death  . Heart attack Cousin 65       died with MI  . Esophageal cancer Neg Hx   . Rectal cancer Neg Hx   . Stomach cancer Neg Hx   . Colon polyps Neg Hx     Social History Social History   Tobacco Use  . Smoking status: Never Smoker  . Smokeless tobacco: Never Used  Substance Use Topics  . Alcohol use: No    Alcohol/week: 0.0 standard drinks  . Drug use: No   Allergies Lisinopril; Doxycycline; and Metronidazole  Review of Systems Review of Systems All other systems are reviewed and are negative for acute change except as noted in the HPI  Physical Exam Vital Signs  I have reviewed the triage vital signs BP (!) 164/85 (BP Location: Left Arm)   Temp 98.3 F (36.8 C) (Oral)   Resp 17   Ht 5\' 2"  (1.575 m)   Wt 104.3 kg   SpO2 95%   BMI 42.07 kg/m   Physical Exam  Constitutional: She is oriented to person, place, and time. She appears well-developed and well-nourished. No distress.  HENT:  Head: Normocephalic and atraumatic.  Nose: Nose normal.  Eyes: Pupils are equal, round, and reactive to light. Conjunctivae and EOM are normal. Right eye exhibits no discharge. Left eye exhibits no discharge. No scleral icterus.  Neck: Normal range of motion. Neck supple.  Cardiovascular: Normal rate and regular rhythm. Exam reveals no gallop and no friction rub.  No murmur heard. Pulmonary/Chest: Effort normal and breath sounds normal. No stridor. No respiratory distress. She has no rales.  Abdominal: Soft. She exhibits no distension. There is no tenderness.  Musculoskeletal: She exhibits no edema or tenderness.  Neurological: She is alert and oriented to person, place, and time.  Skin: Skin is warm and dry.  No rash noted. She is not diaphoretic. No erythema.  Psychiatric: She has a normal mood and affect.  Vitals reviewed.   ED Results and Treatments Labs (all labs ordered are listed, but only abnormal results are displayed) Labs Reviewed  BASIC METABOLIC PANEL - Abnormal; Notable for the following components:      Result Value   Glucose, Bld 128 (*)    All other components within normal limits  CBC - Abnormal; Notable for the following components:   MCV 102.9 (*)    All other components within normal limits  I-STAT TROPONIN, ED  POCT I-STAT TROPONIN I  I-STAT TROPONIN, ED  POCT I-STAT TROPONIN I  EKG  EKG Interpretation  Date/Time:  Tuesday May 11 2018 00:42:12 EST Ventricular Rate:  67 PR Interval:    QRS Duration: 96 QT Interval:  425 QTC Calculation: 449 R Axis:   31 Text Interpretation:  Sinus rhythm No significant change since last tracing Confirmed by Addison Lank [phone]) on 05/11/2018 1:54:23 AM      Radiology Dg Chest 2 View  Result Date: 05/11/2018 CLINICAL DATA:  Chest pain EXAM: CHEST - 2 VIEW COMPARISON:  10/01/2017 FINDINGS: The heart size and mediastinal contours are within normal limits. Both lungs are clear. The visualized skeletal structures are unremarkable. IMPRESSION: No active cardiopulmonary disease. Electronically Signed   By: Ulyses Jarred M.D.   On: 05/11/2018 01:07   Pertinent labs & imaging results that were available during my care of the patient were reviewed by me and considered in my medical decision making (see chart for details).  Medications Ordered in ED Medications  diphenhydrAMINE (BENADRYL) injection 25 mg (25 mg Intravenous Not Given 05/11/18 0636)  acetaminophen (TYLENOL) tablet 1,000 mg (1,000 mg Oral Given 05/11/18 0254)  ketorolac (TORADOL) 15 MG/ML injection 15 mg (15 mg Intravenous Given 05/11/18 0459)    prochlorperazine (COMPAZINE) injection 10 mg (10 mg Intravenous Given 05/11/18 0641)  dexamethasone (DECADRON) injection 10 mg (10 mg Intravenous Given 05/11/18 4132)                                                                                                                                    Procedures Procedures  (including critical care time)  Medical Decision Making / ED Course I have reviewed the nursing notes for this encounter and the patient's prior records (if available in EHR or on provided paperwork).    Patient with intermittent atypical chest pain highly inconsistent with ACS.  Most suspicious for muscle spasms.  EKG without acute ischemic changes or evidence of pericarditis.  Initial troponin negative.  Given her comorbidities will obtain a delta troponin.  Delta trop negative.  Presentation not classic for aortic dissection or esophageal perforation.  Doubt pulmonary embolism.  Chest x-ray without evidence suggestive of pneumonia, pneumothorax, pneumomediastinum.  No abnormal contour of the mediastinum to suggest dissection. No evidence of acute injuries.  Labs grossly reassuring without leukocytosis or anemia.  No significant electrolyte derangements or renal insufficiency.  During work-up patient began complaining of headache.  States that she has been having a headache intermittently for several weeks.  Describes the headache as a shooting pain that migrates all over her head.  Denies any focal deficits.  Doubt ICH or subarachnoid hemorrhage.  Patient is afebrile thus doubt meningitis.  Exam is nonfocal.  Treated with Tylenol and Toradol with minimal relief.  Given migraine cocktail.  Headache improved!  The patient appears reasonably screened and/or stabilized for discharge and I doubt any other medical condition or other [facility] requiring further screening, evaluation, or treatment in the ED at this time prior  to discharge.  The patient is safe for discharge with  strict return precautions.    Final Clinical Impression(s) / ED Diagnoses Final diagnoses:  Muscle cramps  Other chest pain  Episodic tension-type headache, not intractable   Disposition: Discharge  Condition: Good  I have discussed the results, Dx and Tx plan with the patient who expressed understanding and agree(s) with the plan. Discharge instructions discussed at great length. The patient was given strict return precautions who verbalized understanding of the instructions. No further questions at time of discharge.    ED Discharge Orders    None       Follow Up: Berkley Harvey, NP [place] [address] [phone]  Schedule an appointment as soon as possible for a visit  As needed      This chart was dictated using voice recognition software.  Despite best efforts to proofread,  errors can occur which can change the documentation meaning.   Fatima Blank, MD 05/11/18 [phone]

## 2018-06-10 ENCOUNTER — Telehealth: Payer: Self-pay | Admitting: Internal Medicine

## 2018-06-10 DIAGNOSIS — J9611 Chronic respiratory failure with hypoxia: Secondary | ICD-10-CM

## 2018-06-10 NOTE — Telephone Encounter (Signed)
LMTCB

## 2018-06-10 NOTE — Telephone Encounter (Signed)
Pt returning call CB# [phone]/kob

## 2018-06-10 NOTE — Telephone Encounter (Signed)
Spoke with pt, states that her nocturnal O2 is not working Xfew weeks, O2 is dropping down to the 70%'s at night.  Pt states she has been trying to contact [facility] to have this fixed but is unable to get in touch with anyone. Stat order replaced to have nocturnal O2 serviced.  Nothing further needed at this time.

## 2018-06-25 ENCOUNTER — Ambulatory Visit (HOSPITAL_BASED_OUTPATIENT_CLINIC_OR_DEPARTMENT_OTHER)
Admission: RE | Admit: 2018-06-25 | Discharge: 2018-06-25 | Disposition: A | Discharge status: Home / Self Care | Payer: Medicaid Other | Source: Ambulatory Visit | Attending: Physician Assistant | Admitting: Physician Assistant

## 2018-06-25 ENCOUNTER — Other Ambulatory Visit (HOSPITAL_BASED_OUTPATIENT_CLINIC_OR_DEPARTMENT_OTHER): Payer: Self-pay | Admitting: Physician Assistant

## 2018-06-25 DIAGNOSIS — S0992XA Unspecified injury of nose, initial encounter: Secondary | ICD-10-CM | POA: Diagnosis present

## 2018-06-25 DIAGNOSIS — S6992XA Unspecified injury of left wrist, hand and finger(s), initial encounter: Secondary | ICD-10-CM | POA: Insufficient documentation

## 2018-06-25 DIAGNOSIS — S6991XA Unspecified injury of right wrist, hand and finger(s), initial encounter: Secondary | ICD-10-CM | POA: Diagnosis present

## 2018-06-25 DIAGNOSIS — S0990XA Unspecified injury of head, initial encounter: Secondary | ICD-10-CM | POA: Diagnosis present

## 2018-07-06 ENCOUNTER — Encounter: Payer: Self-pay | Admitting: Gastroenterology

## 2018-07-20 ENCOUNTER — Encounter (HOSPITAL_COMMUNITY): Payer: Self-pay

## 2018-07-20 ENCOUNTER — Emergency Department (HOSPITAL_COMMUNITY): Payer: Medicaid Other

## 2018-07-20 ENCOUNTER — Emergency Department (HOSPITAL_COMMUNITY)
Admission: EM | Admit: 2018-07-20 | Discharge: 2018-07-20 | Disposition: A | Discharge status: Home / Self Care | Payer: Medicaid Other | Attending: Emergency Medicine | Admitting: Emergency Medicine

## 2018-07-20 DIAGNOSIS — M5442 Lumbago with sciatica, left side: Secondary | ICD-10-CM | POA: Insufficient documentation

## 2018-07-20 DIAGNOSIS — G8929 Other chronic pain: Secondary | ICD-10-CM | POA: Insufficient documentation

## 2018-07-20 DIAGNOSIS — I10 Essential (primary) hypertension: Secondary | ICD-10-CM | POA: Insufficient documentation

## 2018-07-20 DIAGNOSIS — M549 Dorsalgia, unspecified: Secondary | ICD-10-CM | POA: Diagnosis present

## 2018-07-20 DIAGNOSIS — M5441 Lumbago with sciatica, right side: Secondary | ICD-10-CM | POA: Diagnosis not present

## 2018-07-20 DIAGNOSIS — Z79899 Other long term (current) drug therapy: Secondary | ICD-10-CM | POA: Diagnosis not present

## 2018-07-20 MED ORDER — METHOCARBAMOL 500 MG PO TABS: 500.0000 mg | ORAL_TABLET | Freq: Two times a day (BID) | ORAL | 0 refills | Status: AC

## 2018-07-20 MED ORDER — NAPROXEN 500 MG PO TABS: 500.0000 mg | ORAL_TABLET | Freq: Two times a day (BID) | ORAL | 0 refills | Status: AC

## 2018-07-20 NOTE — ED Provider Notes (Signed)
[facility] DEPT Provider Note   CSN: [id number] Arrival date & time: 07/20/18  1052     History   Chief Complaint Chief Complaint  Patient presents with  . Back Pain  . Wheezing    HPI Ann [name] is a 63 y.o. female.  63 y.o female with a PMH of HTN is to the ED with a chief complaint of I have pulled my back times today.  Patient reports waking up and feeling pain to the lower part of her back, states she had a similar episode 40 years ago which was treated with tramadol, NSAIDs, ibuprofen.  Patient has an orthopedist which she follow-ups with for her back pain along with knee pain, she was supposed to have surgery but reports she has not lost the weight to meet criteria for knee replacements.  She has tried ibuprofen, Tylenol but reports no relieving symptoms at this time.  Denies any urinary retention, bowel incontinence, previous history of cancer or IV drug use.  She also has a complaint of wheezing, reports she has a chronic cough for the past 15 years, seen by her PCP last week and provided with a shot of steroids.  During my deviation there is no wheezing, shortness of breath, pain or URI symptoms.     Past Medical History:  Diagnosis Date  . Adenoma of colon   . Allergy    seasonal  . Anemia    with past pregnancy -not recent  . Anxiety   . Arthritis   . Borderline diabetes   . Chronic cystitis   . Complication of anesthesia    02-20-2014 ([facility]) INTRA-OP RESPIRATORY FAILURE SECONDARY TO POSSIBLE MUCOUS ASPIRATION/  ALSO 06-06-2013 [facility]) POST-OP DESATURATION  EVEN USING CPAP, States some issues if Propofol given to rapidly.  . Diverticulitis   . Diverticulosis of colon   . Dyspnea   . Family history of adverse reaction to anesthesia    PER PT SISTER DIED AFTER GENERAL ANESTHESIA DUE TO UNDIAGNOSED OSA  . GERD (gastroesophageal reflux disease)   . H/O hiatal hernia   . Headache(784.0)    migraines, decreased since turning 50's  .  History of chronic cough    "tight sounding cough"  . History of esophageal dilatation   . History of kidney stones   . History of MRSA infection    3/ 2015  AXILLARY ABSCESS  . History of recurrent UTIs   . History of TIAs NO RESIDUAL   1980;  2005;   2008 PT STATES PER CT  SCARRING RIGHT SIDE OF BRAIN  . Hyperlipidemia   . Hypertension   . Kidney disease    III  . Neuromuscular disorder ([place])    HH  . Neuropathy    pt denies this 07-05-2015  . Nocturia   . OSA on CPAP    PER SLEEP STUDY 09-08-2012  MODERATE OSA. not used in awhile, but thinks needs to start back using due to some weight gain.  . Osteoarthritis of knee    Right  . Sleep apnea   . Stroke [facility]) [phone]   TIA'S  . SUI (stress urinary incontinence, female)     Patient Active Problem List   Diagnosis Date Noted  . Osteoarthritis of right knee 08/28/2017  . Abnormal CT scan, colon   . HLD (hyperlipidemia) 06/03/2017  . 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 GERD (gastroesophageal reflux disease) 06/03/2017  . Anxiety 06/03/2017  . Chronic combined systolic (congestive) and diastolic (congestive)  heart failure ([place]) 06/03/2017  . Hypokalemia 06/03/2017  . Diverticulitis large intestine 06/03/2017  . Lower abdominal pain 06/03/2017  . Non-intractable vomiting with nausea   . Abnormal CT of the abdomen   . Generalized abdominal pain   . LFT elevation   . Diverticulitis of colon 05/18/2017  . Symptomatic cholelithiasis 02/10/2017  . Hypoxia 01/11/2017  . Dyspnea 01/11/2017  . Dysphagia 04/04/2015  . History of esophageal stricture 04/04/2015  . Morbid (severe) obesity due to excess calories ([place]) 04/01/2015  . ACE-inhibitor cough 03/12/2015  . Upper airway cough syndrome 03/12/2015  . Laryngopharyngeal reflux (LPR) 03/12/2015  . Elevated lipids 07/18/2014  . Chest pain 07/18/2014  . Internal hemorrhoids 04/25/2014  . Ureteral stone with hydronephrosis 04/22/2014  . Post-op pain 04/21/2014  .  Rectocele, grade 3 02/20/2014  . Female rectocele without uterine prolapse 08/04/2013  . Unspecified constipation 07/23/2013  . Chronic respiratory failure with hypoxia ([place]) 06/07/2013  . Cystocele 06/06/2013  . Anosmia 01/25/2013  . Unspecified nutritional deficiency   . Polyneuropathy in other diseases classified elsewhere ([place])   . OSA (obstructive sleep apnea)   . Cough 07/27/2012  . Essential hypertension     Past Surgical History:  Procedure Laterality Date  . ANTERIOR AND POSTERIOR REPAIR N/A 06/06/2013   Procedure: Anterior vaginal vault repair, Sacrospinous ligament fixation with UPHOLD lite, Kelly plication, Sacrospinous mesh fixation, Solyx transurethral sling;  Surgeon: Ailene Rud, MD;  Location: [facility];  Service: Urology;  Laterality: N/A;  . CHOLECYSTECTOMY N/A 02/10/2017   Procedure: LAPAROSCOPIC CHOLECYSTECTOMY;  Surgeon: Coralie Keens, MD;  Location: [facility];  Service: General;  Laterality: N/A;  . COLECTOMY WITH COLOSTOMY CREATION/HARTMANN PROCEDURE N/A 06/11/2017   Procedure: OPEN SIGMOID COLECTOMY;  Surgeon: [name], MD;  Location: [facility];  Service: General;  Laterality: N/A;  . CYSTOSCOPY W/ URETERAL STENT PLACEMENT Right 04/21/2014   Procedure: CYSTOSCOPY WITH RETROGRADE PYELOGRAM/URETERAL STENT PLACEMENT;  Surgeon: Ailene Rud, MD;  Location: [facility];  Service: Urology;  Laterality: Right;  . CYSTOSCOPY W/ URETERAL STENT PLACEMENT Right 11/21/2014   Procedure: CYSTOSCOPY WITH RETROGRADE PYELOGRAM, URETEROSCOPY WITH STONE REMOVAL, URETERAL STENT PLACEMENT;  Surgeon: Carolan Clines, MD;  Location: [facility];  Service: Urology;  Laterality: Right;  . CYSTOSCOPY W/ URETERAL STENT PLACEMENT Left 02/01/2016   Procedure: CYSTO, LEFT URETEROSCOPY, LEFT RETROGRADE AND LEFT URETERAL STENT PLACEMENT;  Surgeon: Carolan Clines, MD;  Location: [facility];  Service: Urology;  Laterality: Left;  . CYSTOSCOPY WITH RETROGRADE PYELOGRAM,  URETEROSCOPY AND STENT PLACEMENT Right 06/05/2014   Procedure: CYSTOSCOPY WITH RIGHT RETROGRADE PYELOGRAM, RIGHT URETEROSCOPY, STONE EXTRACTION AND RIGHT DOUBLE J STENT PLACEMENT;  Surgeon: Ailene Rud, MD;  Location: [facility];  Service: Urology;  Laterality: Right;  . ESOPHAGEAL DILATION  X2  LAST ONE  JUNE 2014   has had 9 of these  . ESOPHAGEAL MANOMETRY N/A 09/10/2015   Procedure: ESOPHAGEAL MANOMETRY (EM);  Surgeon: Manus Gunning, MD;  Location[facility];  Service: Gastroenterology;  Laterality: N/A;  . HOLMIUM LASER APPLICATION Right 50/53/9767   Procedure: HOLMIUM LASER OF STONE ;  Surgeon: Ailene Rud, MD;  Location: [facility];  Service: Urology;  Laterality: Right;  . KNEE ARTHROSCOPY Bilateral 2008  . LAPAROSCOPIC CHOLECYSTECTOMY  02/10/2017  . MULTIPLE TOOTH EXTRACTIONS     past hx  . RECTOCELE REPAIR N/A 02/20/2014   Procedure: POSTERIOR REPAIR (RECTOCELE) WITH VAGINAL VAULT REPAIR WITH ACELL GRAFT PLACEMENT, PERINEOPLASTY, ENTEROCELE REPAIR;  Surgeon: Pierre Bali  Joya San, MD;  Location: [facility];  Service: Urology;  Laterality: N/A;  . TUBAL LIGATION  1987     OB History   No obstetric history on file.      Home Medications    Prior to Admission medications   Medication Sig Start Date End Date Taking? Authorizing Provider  acetaminophen (TYLENOL) 325 MG tablet Take 325 mg by mouth every 6 (six) hours as needed for mild pain.    [provider]  amLODipine (NORVASC) 10 MG tablet Take 10 mg by mouth daily.     [provider]  atorvastatin (LIPITOR) 10 MG tablet Take 10 mg by mouth at bedtime.  05/15/17   [provider]  chlorpheniramine (CHLOR-TRIMETON) 4 MG tablet Take 1 tablet (4 mg total) by mouth at bedtime. Patient taking differently: Take 4-8 mg by mouth at bedtime.  03/08/15   Chesley Mires, MD  clonazePAM (KLONOPIN) 0.5 MG tablet Take 0.5 mg by mouth 2 (two) times daily as needed  for anxiety.     [provider]  furosemide (LASIX) 40 MG tablet Take 0.5 tablets (20 mg total) by mouth daily. Patient taking differently: Take 20 mg by mouth daily as needed (for swelling/fluid retention.).  05/28/17   Domenic Polite, MD  hyoscyamine (LEVBID) 0.375 MG 12 hr tablet Take 1 tablet (0.375 mg total) by mouth every 12 (twelve) hours as needed for cramping. Patient not taking: Reported on 05/11/2018 05/28/17   Domenic Polite, MD  ibuprofen (ADVIL,MOTRIN) 200 MG tablet Take 200 mg by mouth every 6 (six) hours as needed for moderate pain.    [provider]  losartan (COZAAR) 50 MG tablet Take 50 mg by mouth daily.    [provider]  methocarbamol (ROBAXIN) 500 MG tablet Take 1 tablet (500 mg total) by mouth 2 (two) times daily for 7 days. 07/20/18 07/27/18  Janeece Fitting, PA-C  metoprolol succinate (TOPROL-XL) 50 MG 24 hr tablet Take 50 mg by mouth at bedtime.  08/08/15   [provider]  naproxen (NAPROSYN) 500 MG tablet Take 1 tablet (500 mg total) by mouth 2 (two) times daily for 7 days. 07/20/18 07/27/18  Janeece Fitting, PA-C  omeprazole (PRILOSEC) 40 MG capsule TAKE 1 CAPSULE(40 MG) BY MOUTH TWICE DAILY Patient taking differently: Take 40 mg by mouth 2 (two) times daily.  09/17/16   Armbruster, [name], MD  oxyCODONE (ROXICODONE) 5 MG immediate release tablet Take 1 tablet (5 mg total) by mouth every 4 (four) hours as needed for severe pain. Patient not taking: Reported on 05/11/2018 08/06/17   Carmin Muskrat, MD  OXYGEN Inhale 4 L into the lungs See admin instructions. With CPAP continuously  and During Waking Hours as needed    [provider]  polyethylene glycol (MIRALAX / GLYCOLAX) packet Take 17 g by mouth daily as needed for moderate constipation. Patient taking differently: Take 17 g by mouth daily.  01/16/17   Bonnielee Haff, MD  promethazine (PHENERGAN) 12.5 MG tablet Take 1 tablet (12.5 mg total) by mouth every 6 (six) hours as needed  for nausea. Patient not taking: Reported on 05/11/2018 08/06/17   Carmin Muskrat, MD  topiramate (TOPAMAX) 50 MG tablet Take 50 mg by mouth 2 (two) times daily.    [provider]  UNABLE TO FIND Med Name: CPAP    [provider]  zolpidem (AMBIEN) 5 MG tablet Take 1 tablet (5 mg total) by mouth at bedtime as needed for sleep. 06/16/17   Doyle Askew,  Angeline Slim, MD    Family History Family History  Problem Relation Age of Onset  . Colon cancer Father 60       died at 57  . Heart disease Mother        died at 28  . Heart attack Mother   . Heart attack Sister        died at 49  . Hypertension Brother   . Heart attack Cousin 14       with death  . Heart attack Cousin 1       died with MI  . Esophageal cancer Neg Hx   . Rectal cancer Neg Hx   . Stomach cancer Neg Hx   . Colon polyps Neg Hx     Social History Social History   Tobacco Use  . Smoking status: Never Smoker  . Smokeless tobacco: Never Used  Substance Use Topics  . Alcohol use: No    Alcohol/week: 0.0 standard drinks  . Drug use: No     Allergies   Lisinopril; Doxycycline; and Metronidazole   Review of Systems Review of Systems  Constitutional: Negative for fever.  Respiratory: Negative for shortness of breath and wheezing.   Musculoskeletal: Positive for back pain.     Physical Exam Updated Vital Signs BP 134/84 (BP Location: Left Arm)   Pulse 87   Temp 98.7 F (37.1 C) (Oral)   Resp 16   Ht 5\' 2"  (1.575 m)   Wt 105.7 kg   SpO2 96%   BMI 42.62 kg/m   Physical Exam Vitals signs and nursing note reviewed.  Constitutional:      General: She is not in acute distress.    Appearance: She is well-developed.  HENT:     Head: Normocephalic and atraumatic.     Mouth/Throat:     Pharynx: No oropharyngeal exudate.  Eyes:     Pupils: Pupils are equal, round, and reactive to light.  Neck:     Musculoskeletal: Normal range of motion.  Cardiovascular:     Rate and Rhythm: Regular  rhythm.     Heart sounds: Normal heart sounds.  Pulmonary:     Effort: Pulmonary effort is normal. No respiratory distress.     Breath sounds: Normal breath sounds.     Comments: Lungs are clear to auscultation no wheezing, rhonchi, rales. Abdominal:     General: Bowel sounds are normal. There is no distension.     Palpations: Abdomen is soft.     Tenderness: There is no abdominal tenderness.  Musculoskeletal:        General: No tenderness or deformity.     Lumbar back: She exhibits pain and spasm.     Right lower leg: No edema.     Left lower leg: No edema.     Comments: Tenderness to palpation along the midline and paraspinal region.  Reports no radiation to lower extremities.  Skin:    General: Skin is warm and dry.  Neurological:     Mental Status: She is alert and oriented to person, place, and time.     Comments: RLE- KF,KE 5/5 strength LLE- HF, HE 5/5 strength Normal gait. No pronator drift. No leg drop.  Patellar reflexes present and symmetric. CN I, II and VIII not tested. CN II-XII grossly intact bilaterally.         ED Treatments / Results  Labs (all labs ordered are listed, but only abnormal results are displayed) Labs Reviewed - No data to display  EKG None  Radiology Dg Lumbar Spine 2-3 Views  Result Date: 07/20/2018 CLINICAL DATA:  Back pain. EXAM: LUMBAR SPINE - 2-3 VIEW COMPARISON:  CT 08/06/2017.  KUB 07/29/2017. FINDINGS: Lumbar spine numbered with the lowest segmented appearing lumbar shaped vertebrae on lateral view as L5. Diffuse degenerative change lumbar spine with 3 mm anterolisthesis L4 on L5 again noted. No significant interim change identified. No evidence of fracture or dislocation. Surgical clips right upper quadrant. IMPRESSION: Diffuse degenerative change lumbar spine with 3 mm anterolisthesis L4 on L5. These changes are stable. No acute abnormality. Electronically Signed   By: Marcello Moores  Register   On: 07/20/2018 14:14     Procedures Procedures (including critical care time)  Medications Ordered in ED Medications - No data to display   Initial Impression / Assessment and Plan / ED Course  I have reviewed the triage vital signs and the nursing notes.  Pertinent labs & imaging results that were available during my care of the patient were reviewed by me and considered in my medical decision making (see chart for details).   With a previous history of back pain, presents to the ED and states she pulled her back today.  Ibuprofen and Tylenol have tried but reports no relieving symptoms.  Ports similar episode 20 years ago, being treated with tramadol and ibuprofen.  Evaluation patient has good strength to flexion and plantarflexion and a steady gait.  She does show some tenderness to palpation along the lower back worse on the left side. DG lumbar showed: Diffuse degenerative change lumbar spine with 3 mm anterolisthesis  L4 on L5. These changes are stable. No acute abnormality.   Will be treated at this time with muscle relaxers along with anti-inflammatories.  Ports not wanting to use steroids on her therapy as she got a steroid shot last week for her wheezing, they also make her gain weight and patient is trying to lose weight for her knee replacements.  No flags such as previous history of cancer, bowel incontinence or urinary retention.  Return precautions discussed at length, vital signs stable for discharge.   Final Clinical Impressions(s) / ED Diagnoses   Final diagnoses:  Chronic low back pain with bilateral sciatica, unspecified back pain laterality    ED Discharge Orders         Ordered    methocarbamol (ROBAXIN) 500 MG tablet  2 times daily     07/20/18 1424    naproxen (NAPROSYN) 500 MG tablet  2 times daily     07/20/18 1424           Janeece Fitting, PA-C 07/20/18 [place], [place], MD 07/21/18 [phone]

## 2018-07-20 NOTE — ED Notes (Signed)
Bed: WTR6 Expected date:  Expected time:  Means of arrival:  Comments: 

## 2018-07-20 NOTE — ED Triage Notes (Addendum)
Patient c/o lower/mid back pain that she thinks she "blew" out while coughing this morning or either lifting a 2 gallon water bucket yesterday. Patient states she is un able to ambulate at this time 10/10 pain. Pain radiates down left leg.   Patient c/o wheezing x3 weeks. Seen last with by Primary and given a steroid shot. Patient does not feel she is getting better. Lung sounds clear in triage.   A/Ox4

## 2018-07-20 NOTE — Discharge Instructions (Signed)
I Have prescribed muscle relaxers for your pain, please do not drink or drive while taking this medications as they can make you drowsy.   Please follow-up with PCP in 1 week for reevaluation of your symptoms.  You experience any bowel or bladder incontinence, fever, worsening in your symptoms please return to the ED.

## 2018-07-27 ENCOUNTER — Telehealth: Payer: Self-pay | Admitting: Gastroenterology

## 2018-07-27 NOTE — Telephone Encounter (Signed)
Patient with a history of esophgeral strictures.  She has had repeated vomiting and "blood" due to violent vomiting.  Patient is advised that she needs evaluation and determine if EGD that was scheduled directly can be moved up.

## 2018-07-28 ENCOUNTER — Ambulatory Visit: Payer: Medicaid Other | Admitting: Physician Assistant

## 2018-07-28 ENCOUNTER — Encounter: Payer: Self-pay | Admitting: Physician Assistant

## 2018-07-28 VITALS — BP 146/84 | HR 58 | Ht 62.0 in | Wt 237.0 lb

## 2018-07-28 DIAGNOSIS — R1310 Dysphagia, unspecified: Secondary | ICD-10-CM | POA: Diagnosis not present

## 2018-07-28 DIAGNOSIS — K222 Esophageal obstruction: Secondary | ICD-10-CM

## 2018-07-28 DIAGNOSIS — K219 Gastro-esophageal reflux disease without esophagitis: Secondary | ICD-10-CM | POA: Diagnosis not present

## 2018-07-28 NOTE — Patient Instructions (Signed)
Continue Omeprazole 40 mg, take 1 tablet by mouth twice daily. Eat a very soft diet.   You have been scheduled for an endoscopy. Please follow written instructions given to you at your visit today. If you use inhalers (even only as needed), please bring them with you on the day of your procedure. Normal BMI (Body Mass Index- based on height and weight) is between 19 and 25. Your BMI today is Body mass index is 43.35 kg/m. Marland Kitchen Please consider follow up  regarding your BMI with your Primary Care Provider.

## 2018-07-28 NOTE — Progress Notes (Signed)
Subjective:    Patient ID: Ann [name], female    DOB: Nov 11, 1955, 63 y.o.   MRN: [id number]  HPI Ann [name] is a pleasant 63 year old white female, known to Dr. Havery Moros who comes in today with complaints of recurrent dysphasia.  Patient has history of chronic GERD and esophageal stricture requiring previous dilations.  She last had EGD in February 2018 per Dr. Havery Moros with finding of a 3 cm hiatal hernia, and distal esophageal stricture was TTS dilated from 16 to 18 mm, and also noted to have mild esophagitis. She also had colonoscopy in February 2018 with removal of a 4 mm cecal polyp which was a tubular adenoma, and had multiple diverticuli. Patient states that her husband passed away this past fall, and she recalls having onset of recurrent dysphasia around that same time.  She is maintained on omeprazole 40 mg twice daily chronically, and says that most of this time that controls heartburn and indigestion.  She has had progressive difficulty over the past 4 months with solid food dysphasia.  She is now having symptoms every day and with every meal and sometimes with liquids.  She is having to regurgitate on a regular basis after food becomes lodged.  She says sometimes that she retches hard and will bring up streaky bright red blood.  At times she will also have foamy liquid, but even if she has not eaten recently.  She has on occasion regurgitated food which she thinks was eaten the previous day. Esophageal manometry high-resolution 2017 was normal Patient has history of hypertension, congestive heart failure, sleep apnea with oxygen use with CPAP, diverticulosis, and morbid obesity.  Family history is positive for colon cancer in her father. No regular aspirin or NSAIDs, and no blood thinners. She is adamant that she needs to have a procedure done as soon as possible and would like it done this week.  Review of Systems Pertinent positive and negative review of systems were noted in the above  HPI section.  All other review of systems was otherwise negative.  Outpatient Encounter Medications as of 07/28/2018  Medication Sig  . amLODipine (NORVASC) 10 MG tablet Take 10 mg by mouth daily.   Marland Kitchen atorvastatin (LIPITOR) 10 MG tablet Take 10 mg by mouth at bedtime.   . chlorpheniramine (CHLOR-TRIMETON) 4 MG tablet Take 1 tablet (4 mg total) by mouth at bedtime. (Patient taking differently: Take 4-8 mg by mouth at bedtime. )  . clonazePAM (KLONOPIN) 0.5 MG tablet Take 0.5 mg by mouth 2 (two) times daily as needed for anxiety.   . furosemide (LASIX) 40 MG tablet Take 0.5 tablets (20 mg total) by mouth daily. (Patient taking differently: Take 20 mg by mouth daily as needed (for swelling/fluid retention.). )  . hyoscyamine (LEVBID) 0.375 MG 12 hr tablet Take 1 tablet (0.375 mg total) by mouth every 12 (twelve) hours as needed for cramping.  Marland Kitchen ibuprofen (ADVIL,MOTRIN) 200 MG tablet Take 200 mg by mouth every 6 (six) hours as needed for moderate pain.  Marland Kitchen losartan (COZAAR) 50 MG tablet Take 50 mg by mouth daily.  . metoprolol succinate (TOPROL-XL) 50 MG 24 hr tablet Take 50 mg by mouth at bedtime.   Marland Kitchen omeprazole (PRILOSEC) 40 MG capsule TAKE 1 CAPSULE(40 MG) BY MOUTH TWICE DAILY (Patient taking differently: Take 40 mg by mouth 2 (two) times daily. )  . OXYGEN Inhale 4 L into the lungs See admin instructions. With CPAP continuously  and During Waking Hours as needed  .  polyethylene glycol (MIRALAX / GLYCOLAX) packet Take 17 g by mouth daily as needed for moderate constipation. (Patient taking differently: Take 17 g by mouth daily. )  . topiramate (TOPAMAX) 50 MG tablet Take 50 mg by mouth 2 (two) times daily.  Marland Kitchen UNABLE TO FIND Med Name: CPAP  . [DISCONTINUED] acetaminophen (TYLENOL) 325 MG tablet Take 325 mg by mouth every 6 (six) hours as needed for mild pain.  . [DISCONTINUED] oxyCODONE (ROXICODONE) 5 MG immediate release tablet Take 1 tablet (5 mg total) by mouth every 4 (four) hours as needed for  severe pain. (Patient not taking: Reported on 05/11/2018)  . [DISCONTINUED] promethazine (PHENERGAN) 12.5 MG tablet Take 1 tablet (12.5 mg total) by mouth every 6 (six) hours as needed for nausea. (Patient not taking: Reported on 05/11/2018)  . [DISCONTINUED] zolpidem (AMBIEN) 5 MG tablet Take 1 tablet (5 mg total) by mouth at bedtime as needed for sleep. (Patient not taking: Reported on 07/28/2018)   No facility-administered encounter medications on file as of 07/28/2018.    Allergies  Allergen Reactions  . Lisinopril Cough  . Doxycycline Diarrhea    Severe headaches  . Metronidazole Other (See Comments)    Severe headaches BUT CAN BE TOLERATED   Patient Active Problem List   Diagnosis Date Noted  . Osteoarthritis of right knee 08/28/2017  . Abnormal CT scan, colon   . HLD (hyperlipidemia) 06/03/2017  . Stroke (cerebrum) ([place]) 06/03/2017  . GERD (gastroesophageal reflux disease) 06/03/2017  . Anxiety 06/03/2017  . Chronic combined systolic (congestive) and diastolic (congestive) heart failure ([place]) 06/03/2017  . Hypokalemia 06/03/2017  . Diverticulitis large intestine 06/03/2017  . Lower abdominal pain 06/03/2017  . Non-intractable vomiting with nausea   . Abnormal CT of the abdomen   . Generalized abdominal pain   . LFT elevation   . Diverticulitis of colon 05/18/2017  . Symptomatic cholelithiasis 02/10/2017  . Hypoxia 01/11/2017  . Dyspnea 01/11/2017  . Dysphagia 04/04/2015  . History of esophageal stricture 04/04/2015  . Morbid (severe) obesity due to excess calories ([place]) 04/01/2015  . ACE-inhibitor cough 03/12/2015  . Upper airway cough syndrome 03/12/2015  . Laryngopharyngeal reflux (LPR) 03/12/2015  . Elevated lipids 07/18/2014  . Chest pain 07/18/2014  . Internal hemorrhoids 04/25/2014  . Ureteral stone with hydronephrosis 04/22/2014  . Post-op pain 04/21/2014  . Rectocele, grade 3 02/20/2014  . Female rectocele without uterine prolapse 08/04/2013  . Unspecified  constipation 07/23/2013  . Chronic respiratory failure with hypoxia ([place]) 06/07/2013  . Cystocele 06/06/2013  . Anosmia 01/25/2013  . Unspecified nutritional deficiency   . Polyneuropathy in other diseases classified elsewhere ([place])   . OSA (obstructive sleep apnea)   . Cough 07/27/2012  . Essential hypertension    Social History   Socioeconomic History  . Marital status: Married    Spouse name: Ann [name]  . Number of children: 3  . Years of education: 58  . Highest education level: Not on file  Occupational History  . Occupation: Geographical information systems officer  . Financial resource strain: Not on file  . Food insecurity:    Worry: Not on file    Inability: Not on file  . Transportation needs:    Medical: Not on file    Non-medical: Not on file  Tobacco Use  . Smoking status: Never Smoker  . Smokeless tobacco: Never Used  Substance and Sexual Activity  . Alcohol use: No    Alcohol/week: 0.0 standard drinks  . Drug use: No  .  Sexual activity: Never    Birth control/protection: Post-menopausal  Lifestyle  . Physical activity:    Days per week: Not on file    Minutes per session: Not on file  . Stress: Not on file  Relationships  . Social connections:    Talks on phone: Not on file    Gets together: Not on file    Attends religious service: Not on file    Active member of club or organization: Not on file    Attends meetings of clubs or organizations: Not on file    Relationship status: Not on file  . Intimate partner violence:    Fear of current or ex partner: Not on file    Emotionally abused: Not on file    Physically abused: Not on file    Forced sexual activity: Not on file  Other Topics Concern  . Not on file  Social History Narrative   Patient lives at home with her husband 58 years. Ann [name]). Patient has a 12th grade education.    Right handed.    Caffeine - two daily.    Ann [name] family history includes Colon cancer (age of onset: 69) in her father; Heart  attack in her mother and sister; Heart attack (age of onset: 31) in her cousin; Heart attack (age of onset: 66) in her cousin; Heart disease in her mother; Hypertension in her brother.      Objective:    Vitals:   07/28/18 1514  BP: (!) 146/84  Pulse: (!) 58  SpO2: 93%    Physical Exam; well-developed older white female in no acute distress, accompanied by 4 small grandchildren.  Height 5 foot 2, weight 237, BMI 43.3 .  HEENT ;nontraumatic normocephalic EOMI PERRLA sclera anicteric oral mucosa moist, Cardiovascular ;regular rate and rhythm with S1-S2.  Pulmonary clear bilaterally, Abdomen ;obese soft nontender nondistended bowel sounds active no palpable mass or hepatosplenomegaly.  Extremities; no clubbing cyanosis or edema skin warm dry, Neuropsych; alert and oriented, grossly nonfocal mood and affect appropriate       Assessment & Plan:   #25 63 year old white female with history of esophageal stricture status post previous dilations who last had EGD with dilation in February 2018 who presents with recurrent solid food dysphagia over the past 4 months, progressive and now to the point of having symptoms with every meal and requiring regurgitation on a daily basis. She has intermittently regurgitated some bright red blood along with food.  Symptoms are very consistent with recurrent esophageal stricture, she may have associated esophagitis secondary to transient food impactions on a repeated basis.  #2 sleep apnea use of oxygen with CPAP only #3.  Morbid obesity-BMI 43 #4.  Congestive heart failure-EF 45 to 50% #5.  Hypertension #6.  History of adenomatous colon polyps-up-to-date with colonoscopy last done February 2018  #7 Diverticulosis  Plan; Patient was adamant that she needed to have a procedure done this week.  Dr. Havery Moros did not have any availability for procedures until next week.  Patient is therefore scheduled for EGD with esophageal dilation with Dr. Rush Landmark   tomorrow 07/29/18.  That procedure was discussed in detail with patient including indications risks and benefits and she is agreeable to proceed.  Continue omeprazole 40 mg p.o. twice daily. Patient will need repeat colonoscopy in February 2023.   Ann DAKIA SCHIFANO PA-C 07/28/2018   Cc: Berkley Harvey, NP

## 2018-07-29 ENCOUNTER — Encounter: Payer: Self-pay | Admitting: Gastroenterology

## 2018-07-29 ENCOUNTER — Ambulatory Visit (AMBULATORY_SURGERY_CENTER): Payer: Medicaid Other | Admitting: Gastroenterology

## 2018-07-29 VITALS — BP 145/66 | HR 71 | Temp 97.7°F | Resp 17 | Ht 62.0 in | Wt 237.0 lb

## 2018-07-29 DIAGNOSIS — K317 Polyp of stomach and duodenum: Secondary | ICD-10-CM

## 2018-07-29 DIAGNOSIS — K449 Diaphragmatic hernia without obstruction or gangrene: Secondary | ICD-10-CM

## 2018-07-29 DIAGNOSIS — K2970 Gastritis, unspecified, without bleeding: Secondary | ICD-10-CM | POA: Diagnosis not present

## 2018-07-29 DIAGNOSIS — K219 Gastro-esophageal reflux disease without esophagitis: Secondary | ICD-10-CM | POA: Diagnosis not present

## 2018-07-29 DIAGNOSIS — K222 Esophageal obstruction: Secondary | ICD-10-CM

## 2018-07-29 DIAGNOSIS — K3189 Other diseases of stomach and duodenum: Secondary | ICD-10-CM

## 2018-07-29 HISTORY — PX: UPPER GASTROINTESTINAL ENDOSCOPY: SHX188

## 2018-07-29 MED ORDER — SODIUM CHLORIDE 0.9 % IV SOLN: 500.0000 mL | Freq: Once | INTRAVENOUS | Status: DC

## 2018-07-29 NOTE — Progress Notes (Signed)
Called to room to assist during endoscopic procedure.  Patient ID and intended procedure confirmed with present staff. Received instructions for my participation in the procedure from the performing physician.  

## 2018-07-29 NOTE — Progress Notes (Signed)
Agree with assessment and plan. Will plan diagnostic and potentially therapeutic EGD on 1/23. Will consider EoE biopsies as well, since most biopsies have been of the stricture with history of Barrett's on one of the more recent biopsies.

## 2018-07-29 NOTE — Op Note (Addendum)
[place] Patient Name: Ann [name] Procedure Date: 07/29/2018 9:05 AM MRN: [id number] Endoscopist: Justice Britain , MD Age: 63 Referring MD:  Date of Birth: 1956-05-09 Gender: Female Account #: [id number] Procedure:                Upper GI endoscopy Indications:              Esophageal dysphagia, Heartburn, Follow-up of                            esophageal stenosis, For therapy of esophageal                            stenosis Medicines:                Monitored Anesthesia Care Procedure:                Pre-Anesthesia Assessment:                           - Prior to the procedure, a History and Physical                            was performed, and patient medications and                            allergies were reviewed. The patient's tolerance of                            previous anesthesia was also reviewed. The risks                            and benefits of the procedure and the sedation                            options and risks were discussed with the patient.                            All questions were answered, and informed consent                            was obtained. Prior Anticoagulants: The patient has                            taken no previous anticoagulant or antiplatelet                            agents. ASA Grade Assessment: III - A patient with                            severe systemic disease. After reviewing the risks                            and benefits, the patient was deemed in  satisfactory condition to undergo the procedure.                           After obtaining informed consent, the endoscope was                            passed under direct vision. Throughout the                            procedure, the patient's blood pressure, pulse, and                            oxygen saturations were monitored continuously. The                            Endoscope was introduced through the mouth, and                             advanced to the second part of duodenum. The upper                            GI endoscopy was accomplished without difficulty.                            The patient tolerated the procedure. Scope In: Scope Out: Findings:                 No gross lesions were noted in the entire                            esophagus. Biopsies were taken with a cold forceps                            for histology from the proximal/middle esophagus                            for EoE. Biopsies were taken with a cold forceps                            for histology from the distal esophagus for EoE.                           One benign-appearing, intrinsic mild                            (non-circumferential scarring) stenosis was found                            at the gastroesophageal junction. This stenosis                            measured 1.4 cm (inner diameter) x less than one cm                            (  in length). The stenosis was traversed. A                            guidewire was placed and the scope was withdrawn.                            Dilation was performed with a Savary dilator with                            no resistance at 12 mm and 13 mm, mild resistance                            at 14 mm and 15 mm and moderate resistance at 16                            mm. The dilation site was examined following                            endoscope reinsertion and showed moderate mucosal                            disruption, moderate improvement in luminal                            narrowing and no perforation.                           A medium-sized hiatal hernia was found. The                            proximal extent of the gastric folds (end of                            tubular esophagus) was 35 cm from the incisors. The                            hiatal narrowing was 38 cm from the incisors. The                            Z-line was 34 cm from the  incisors.                           The gastroesophageal flap valve was visualized                            endoscopically and classified as Hill Grade IV (no                            fold, wide open lumen, hiatal hernia present).                           Striped moderately erythematous mucosa without  bleeding was found in the cardia, in the gastric                            fundus and in the gastric antrum.                           Multiple 1 to 2 mm semi-sessile polyps were found                            in the gastric body - consistent with fundic gland                            polyps. Biopsies were taken with a cold forceps for                            histology to rule out adenomatous tissue from 6                            polyps.                           No other gross lesions were noted in the entire                            examined stomach. Biopsies were taken with a cold                            forceps for histology and Helicobacter pylori                            testing from the antrum/incisura/greater                            curve/lesser curve/cardia/fundus.                           No gross lesions were noted in the duodenal bulb,                            in the first portion of the duodenum and in the                            second portion of the duodenum. Complications:            No immediate complications. Estimated Blood Loss:     Estimated blood loss was minimal. Impression:               - No gross lesions in esophagus. Biopsied for EoE.                           - Benign-appearing esophageal stenosis. Dilated.                           - Medium-sized hiatal hernia. Gastroesophageal flap  valve classified as Hill Grade IV (no fold, wide                            open lumen, hiatal hernia present).                           - Erythematous mucosa in the cardia, gastric fundus                             and antrum.                           - Multiple gastric polyps. Biopsied.                           - No gross lesions in the stomach. Biopsied for HP.                           - No gross lesions in the duodenal bulb, in the                            first portion of the duodenum and in the second                            portion of the duodenum. Recommendation:           - The patient will be observed post-procedure,                            until all discharge criteria are met.                           - Discharge patient to home.                           - Patient has a contact number available for                            emergencies. The signs and symptoms of potential                            delayed complications were discussed with the                            patient. Return to normal activities tomorrow.                            Written discharge instructions were provided to the                            patient.                           - Soft diet today.                           -  Observe patient's clinical course.                           - Await pathology results.                           - Repeat upper endoscopy in 3-4 weeks for                            retreatment and consider repeat dilation.                           - WIll discuss with Dr. Havery Moros.                           - The findings and recommendations were discussed                            with the patient.                           - The findings and recommendations were discussed                            with the patient's family. Justice Britain, MD 07/29/2018 9:55:54 AM

## 2018-07-29 NOTE — Progress Notes (Signed)
A/ox3, pleased with MAC, report to RN 

## 2018-07-29 NOTE — Patient Instructions (Signed)
EXPECT A CALL FROM DR MANSOURATY OR DR ARMBRUESTER'S OFFICE STAFF REGARDING REPEAT ENDOSCOPY.   YOU HAD AN ENDOSCOPIC PROCEDURE TODAY AT [facility]:   Refer to the procedure report that was given to you for any specific questions about what was found during the examination.  If the procedure report does not answer your questions, please call your gastroenterologist to clarify.  If you requested that your care partner not be given the details of your procedure findings, then the procedure report has been included in a sealed envelope for you to review at your convenience later.  YOU SHOULD EXPECT: Some feelings of bloating in the abdomen. Passage of more gas than usual.  Walking can help get rid of the air that was put into your GI tract during the procedure and reduce the bloating. If you had a lower endoscopy (such as a colonoscopy or flexible sigmoidoscopy) you may notice spotting of blood in your stool or on the toilet paper. If you underwent a bowel prep for your procedure, you may not have a normal bowel movement for a few days.  Please Note:  You might notice some irritation and congestion in your nose or some drainage.  This is from the oxygen used during your procedure.  There is no need for concern and it should clear up in a day or so.  SYMPTOMS TO REPORT IMMEDIATELY:    Following upper endoscopy (EGD)  Vomiting of blood or coffee ground material  New chest pain or pain under the shoulder blades  Painful or persistently difficult swallowing  New shortness of breath  Fever of 100F or higher  Black, tarry-looking stools  For urgent or emergent issues, a gastroenterologist can be reached at any hour by calling [phone].   DIET:  SOFT DIET ONLY TODAY. RESUME YOUR REGULAR DIET IN THE AM  ACTIVITY:  You should plan to take it easy for the rest of today and you should NOT DRIVE or use heavy machinery until tomorrow (because of the sedation medicines used during  the test).    FOLLOW UP: Our staff will call the number listed on your records the next business day following your procedure to check on you and address any questions or concerns that you may have regarding the information given to you following your procedure. If we do not reach you, we will leave a message.  However, if you are feeling well and you are not experiencing any problems, there is no need to return our call.  We will assume that you have returned to your regular daily activities without incident.  If any biopsies were taken you will be contacted by phone or by letter within the next 1-3 weeks.  Please call us at [phone] if you have not heard about the biopsies in 3 weeks.    SIGNATURES/CONFIDENTIALITY: You and/or your care partner have signed paperwork which will be entered into your electronic medical record.  These signatures attest to the fact that that the information above on your After Visit Summary has been reviewed and is understood.  Full responsibility of the confidentiality of this discharge information lies with you and/or your care-partner.

## 2018-07-30 ENCOUNTER — Telehealth: Payer: Self-pay

## 2018-07-30 NOTE — Telephone Encounter (Signed)
  Follow up Call-  Call back number 07/29/2018 08/26/2016  Post procedure Call Back phone  # [phone]  Permission to leave phone message Yes Yes  Some recent data might be hidden     Patient questions:  Do you have a fever, pain , or abdominal swelling?Yes Pain Score  3 * Same "soreness" as before procedure  Have you tolerated food without any problems? Yes.    Have you been able to return to your normal activities? Yes.    Do you have any questions about your discharge instructions: Diet   No. Medications  No. Follow up visit  No.  Do you have questions or concerns about your Care? No.  Actions: * If pain score is 4 or above: No action needed, pain <4.

## 2018-08-02 ENCOUNTER — Telehealth: Payer: Self-pay | Admitting: Gastroenterology

## 2018-08-02 NOTE — Telephone Encounter (Signed)
Patty, thanks for letting me know. She had been experiencing these same symptoms prior to the procedure in regards to solids and liquids. I think it is low risk overall for complication but if she is feeling issues then we should obtain a CXR as well as a Cervical Neck X-Ray to ensure there is no free air or dissection noted. She should be using Cepacol or Sucrets lozenges PRN to help with the throat. We can always plan to repeat a dilation higher up to 18 within 2-3 weeks rather than waiting the 4-weeks that we previously discussed Richardson Landry. For now, would do the above [place], and then we can determine next steps with Richardson Landry as he is her primary as to repeat EGD, though not clear to me how beneficial we have been as such up to now. Thanks. GM

## 2018-08-02 NOTE — Telephone Encounter (Signed)
EGD for dysphagia-esophageal stenosis with savory on 07/29/18 (Thursday), on 1/24 (Friday) began to have dysphagia solid and liquid, vomited food on Saturday. She feels like everything wants to come back up.  Throat feels raw.  Please advise

## 2018-08-02 NOTE — Telephone Encounter (Signed)
Pt states that her throat is still very sore, she had egd on 1/23, she would like some advise.

## 2018-08-02 NOTE — Telephone Encounter (Signed)
Thanks for letting me know. If she is having chest pain or breathing problems agree with xray / evaluation, although sounds like this is more so dysphagia / throat irritation. Agree with with conservative measures otherwise as outlined. If things worsen in the interim she should call back. If she continues to have dysphagia after she recovers from this EGD, I agree would repeat EGD in 2-3 weeks with another dilation to larger diameter as she does have a known stricture, can schedule with me. Thanks

## 2018-08-02 NOTE — Telephone Encounter (Signed)
The pt was advised that she could have an xray at this time.  She states she will try sucrets and cepacol as needed and if she does not improve or worsens will vall back for xray.

## 2018-08-05 ENCOUNTER — Encounter: Payer: Medicaid Other | Admitting: Gastroenterology

## 2018-08-05 ENCOUNTER — Encounter: Payer: Self-pay | Admitting: Gastroenterology

## 2018-08-05 ENCOUNTER — Other Ambulatory Visit: Payer: Self-pay

## 2018-08-05 MED ORDER — FLUCONAZOLE 200 MG PO TABS: ORAL_TABLET | ORAL | 0 refills | Status: DC

## 2018-08-06 ENCOUNTER — Other Ambulatory Visit: Payer: Self-pay

## 2018-08-08 ENCOUNTER — Inpatient Hospital Stay (HOSPITAL_COMMUNITY)
Admission: EM | Admit: 2018-08-08 | Discharge: 2018-08-12 | DRG: 607 | Disposition: A | Discharge status: Home / Self Care | Payer: Medicaid Other | Attending: Internal Medicine | Admitting: Internal Medicine

## 2018-08-08 ENCOUNTER — Encounter (HOSPITAL_COMMUNITY): Payer: Self-pay | Admitting: *Deleted

## 2018-08-08 ENCOUNTER — Emergency Department (HOSPITAL_COMMUNITY): Payer: Medicaid Other

## 2018-08-08 ENCOUNTER — Other Ambulatory Visit: Payer: Self-pay

## 2018-08-08 DIAGNOSIS — G43909 Migraine, unspecified, not intractable, without status migrainosus: Secondary | ICD-10-CM | POA: Diagnosis present

## 2018-08-08 DIAGNOSIS — R519 Headache, unspecified: Secondary | ICD-10-CM | POA: Diagnosis present

## 2018-08-08 DIAGNOSIS — K219 Gastro-esophageal reflux disease without esophagitis: Secondary | ICD-10-CM | POA: Diagnosis present

## 2018-08-08 DIAGNOSIS — R402362 Coma scale, best motor response, obeys commands, at arrival to emergency department: Secondary | ICD-10-CM | POA: Diagnosis present

## 2018-08-08 DIAGNOSIS — N179 Acute kidney failure, unspecified: Secondary | ICD-10-CM | POA: Diagnosis present

## 2018-08-08 DIAGNOSIS — E538 Deficiency of other specified B group vitamins: Secondary | ICD-10-CM | POA: Diagnosis present

## 2018-08-08 DIAGNOSIS — R402252 Coma scale, best verbal response, oriented, at arrival to emergency department: Secondary | ICD-10-CM | POA: Diagnosis present

## 2018-08-08 DIAGNOSIS — L299 Pruritus, unspecified: Secondary | ICD-10-CM | POA: Diagnosis present

## 2018-08-08 DIAGNOSIS — Z888 Allergy status to other drugs, medicaments and biological substances status: Secondary | ICD-10-CM

## 2018-08-08 DIAGNOSIS — R402142 Coma scale, eyes open, spontaneous, at arrival to emergency department: Secondary | ICD-10-CM | POA: Diagnosis present

## 2018-08-08 DIAGNOSIS — K5730 Diverticulosis of large intestine without perforation or abscess without bleeding: Secondary | ICD-10-CM | POA: Diagnosis present

## 2018-08-08 DIAGNOSIS — G4733 Obstructive sleep apnea (adult) (pediatric): Secondary | ICD-10-CM | POA: Diagnosis present

## 2018-08-08 DIAGNOSIS — Z87442 Personal history of urinary calculi: Secondary | ICD-10-CM

## 2018-08-08 DIAGNOSIS — E875 Hyperkalemia: Secondary | ICD-10-CM | POA: Diagnosis present

## 2018-08-08 DIAGNOSIS — Z7951 Long term (current) use of inhaled steroids: Secondary | ICD-10-CM

## 2018-08-08 DIAGNOSIS — Z6841 Body Mass Index (BMI) 40.0 and over, adult: Secondary | ICD-10-CM

## 2018-08-08 DIAGNOSIS — Z8673 Personal history of transient ischemic attack (TIA), and cerebral infarction without residual deficits: Secondary | ICD-10-CM

## 2018-08-08 DIAGNOSIS — R51 Headache: Secondary | ICD-10-CM

## 2018-08-08 DIAGNOSIS — L282 Other prurigo: Secondary | ICD-10-CM | POA: Diagnosis present

## 2018-08-08 DIAGNOSIS — I10 Essential (primary) hypertension: Secondary | ICD-10-CM | POA: Diagnosis present

## 2018-08-08 DIAGNOSIS — I110 Hypertensive heart disease with heart failure: Secondary | ICD-10-CM | POA: Diagnosis present

## 2018-08-08 DIAGNOSIS — Z8249 Family history of ischemic heart disease and other diseases of the circulatory system: Secondary | ICD-10-CM

## 2018-08-08 DIAGNOSIS — Z881 Allergy status to other antibiotic agents status: Secondary | ICD-10-CM

## 2018-08-08 DIAGNOSIS — B3781 Candidal esophagitis: Secondary | ICD-10-CM | POA: Diagnosis present

## 2018-08-08 DIAGNOSIS — T380X5A Adverse effect of glucocorticoids and synthetic analogues, initial encounter: Secondary | ICD-10-CM | POA: Diagnosis present

## 2018-08-08 DIAGNOSIS — M1990 Unspecified osteoarthritis, unspecified site: Secondary | ICD-10-CM | POA: Diagnosis present

## 2018-08-08 DIAGNOSIS — R21 Rash and other nonspecific skin eruption: Secondary | ICD-10-CM

## 2018-08-08 DIAGNOSIS — R739 Hyperglycemia, unspecified: Secondary | ICD-10-CM | POA: Diagnosis present

## 2018-08-08 DIAGNOSIS — I5042 Chronic combined systolic (congestive) and diastolic (congestive) heart failure: Secondary | ICD-10-CM | POA: Diagnosis present

## 2018-08-08 DIAGNOSIS — Z9049 Acquired absence of other specified parts of digestive tract: Secondary | ICD-10-CM

## 2018-08-08 DIAGNOSIS — Z800 Family history of malignant neoplasm of digestive organs: Secondary | ICD-10-CM

## 2018-08-08 DIAGNOSIS — E785 Hyperlipidemia, unspecified: Secondary | ICD-10-CM | POA: Diagnosis present

## 2018-08-08 LAB — DIFFERENTIAL
Abs Immature Granulocytes: 0.01 10*3/uL (ref 0.00–0.07)
Basophils Absolute: 0 10*3/uL (ref 0.0–0.1)
Basophils Relative: 0 %
Eosinophils Absolute: 0.2 10*3/uL (ref 0.0–0.5)
Eosinophils Relative: 3 %
Immature Granulocytes: 0 %
LYMPHS PCT: 23 %
Lymphs Abs: 1.4 10*3/uL (ref 0.7–4.0)
Monocytes Absolute: 0.4 10*3/uL (ref 0.1–1.0)
Monocytes Relative: 6 %
Neutro Abs: 4.1 10*3/uL (ref 1.7–7.7)
Neutrophils Relative %: 68 %

## 2018-08-08 LAB — CBC
HCT: 38.2 % (ref 36.0–46.0)
Hemoglobin: 12 g/dL (ref 12.0–15.0)
MCH: 33.3 pg (ref 26.0–34.0)
MCHC: 31.4 g/dL (ref 30.0–36.0)
MCV: 106.1 fL — ABNORMAL HIGH (ref 80.0–100.0)
Platelets: 205 10*3/uL (ref 150–400)
RBC: 3.6 MIL/uL — ABNORMAL LOW (ref 3.87–5.11)
RDW: 14.4 % (ref 11.5–15.5)
WBC: 6.1 10*3/uL (ref 4.0–10.5)
nRBC: 0 % (ref 0.0–0.2)

## 2018-08-08 LAB — LACTIC ACID, PLASMA: Lactic Acid, Venous: 1.3 mmol/L (ref 0.5–1.9)

## 2018-08-08 MED ORDER — MORPHINE SULFATE (PF) 4 MG/ML IV SOLN
6.0000 mg | Freq: Once | INTRAVENOUS | Status: AC
  Administered 2018-08-08: 6 mg via INTRAVENOUS
  Filled 2018-08-08: qty 2

## 2018-08-08 MED ORDER — MIDAZOLAM HCL 2 MG/2ML IJ SOLN
2.0000 mg | Freq: Once | INTRAMUSCULAR | Status: AC
  Administered 2018-08-09: 2 mg via INTRAVENOUS
  Filled 2018-08-08: qty 2

## 2018-08-08 MED ORDER — DIPHENHYDRAMINE HCL 50 MG/ML IJ SOLN
25.0000 mg | Freq: Once | INTRAMUSCULAR | Status: AC
  Administered 2018-08-08: 25 mg via INTRAVENOUS
  Filled 2018-08-08: qty 1

## 2018-08-08 MED ORDER — SODIUM CHLORIDE 0.9 % IV SOLN
INTRAVENOUS | Status: DC
  Administered 2018-08-08: 21:00:00 via INTRAVENOUS

## 2018-08-08 MED ORDER — FAMOTIDINE IN NACL 20-0.9 MG/50ML-% IV SOLN
20.0000 mg | Freq: Once | INTRAVENOUS | Status: AC
  Administered 2018-08-08: 20 mg via INTRAVENOUS
  Filled 2018-08-08: qty 50

## 2018-08-08 MED ORDER — METOCLOPRAMIDE HCL 5 MG/ML IJ SOLN
10.0000 mg | Freq: Once | INTRAMUSCULAR | Status: AC
  Administered 2018-08-08: 10 mg via INTRAVENOUS
  Filled 2018-08-08: qty 2

## 2018-08-08 MED ORDER — DEXAMETHASONE SODIUM PHOSPHATE 10 MG/ML IJ SOLN
10.0000 mg | Freq: Once | INTRAMUSCULAR | Status: AC
  Administered 2018-08-08: 10 mg via INTRAVENOUS
  Filled 2018-08-08: qty 1

## 2018-08-08 MED ORDER — LIDOCAINE HCL 2 % IJ SOLN
10.0000 mL | Freq: Once | INTRAMUSCULAR | Status: AC
  Administered 2018-08-09: 200 mg via INTRADERMAL
  Filled 2018-08-08: qty 20

## 2018-08-08 MED ORDER — SODIUM CHLORIDE 0.9 % IV BOLUS
1000.0000 mL | Freq: Once | INTRAVENOUS | Status: AC
  Administered 2018-08-08: 1000 mL via INTRAVENOUS

## 2018-08-08 NOTE — ED Notes (Signed)
Main lab called for blood draw 

## 2018-08-08 NOTE — ED Provider Notes (Signed)
[facility] DEPT Provider Note   CSN: [id number] Arrival date & time: 08/08/18  1742     History   Chief Complaint Chief Complaint  Patient presents with  . Rash  . Headache    HPI Ann [name] is a 63 y.o. female.  63 year old female who presents with rash which is characterizes pruritic along with mild frontal headache.  Symptoms began 2 days ago.  Has been using Benadryl without relief.  Denies any fever.  Has not had any emesis.  Describes mild photophobia.  Diagnosed with yeast in her esophagus.  Has not started medications yet.  Denies any intraoral involvement of her rash.  No prior history of same.  Symptoms have been progressively worse nothing makes them better.     Past Medical History:  Diagnosis Date  . Adenoma of colon   . Allergy    seasonal  . Anemia    with past pregnancy -not recent  . Anxiety   . Arthritis   . Borderline diabetes   . Chronic cystitis   . Complication of anesthesia    02-20-2014 ([facility]) INTRA-OP RESPIRATORY FAILURE SECONDARY TO POSSIBLE MUCOUS ASPIRATION/  ALSO 06-06-2013 [facility]) POST-OP DESATURATION  EVEN USING CPAP, States some issues if Propofol given to rapidly.  . Diverticulitis   . Diverticulosis of colon   . Dyspnea   . Family history of adverse reaction to anesthesia    PER PT SISTER DIED AFTER GENERAL ANESTHESIA DUE TO UNDIAGNOSED OSA  . GERD (gastroesophageal reflux disease)   . H/O hiatal hernia   . Headache(784.0)    migraines, decreased since turning 50's  . History of chronic cough    "tight sounding cough"  . History of esophageal dilatation   . History of kidney stones   . History of MRSA infection    3/ 2015  AXILLARY ABSCESS  . History of recurrent UTIs   . History of TIAs NO RESIDUAL   1980;  2005;   2008 PT STATES PER CT  SCARRING RIGHT SIDE OF BRAIN  . Hyperlipidemia   . Hypertension   . Kidney disease    III  . Neuromuscular disorder ([place])    HH  . Neuropathy    pt denies  this 07-05-2015  . Nocturia   . OSA on CPAP    PER SLEEP STUDY 09-08-2012  MODERATE OSA. not used in awhile, but thinks needs to start back using due to some weight gain.  . Osteoarthritis of knee    Right  . Sleep apnea   . Stroke [facility]) [phone]   TIA'S  . SUI (stress urinary incontinence, female)     Patient Active Problem List   Diagnosis Date Noted  . Osteoarthritis of right knee 08/28/2017  . Abnormal CT scan, colon   . HLD (hyperlipidemia) 06/03/2017  . 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 GERD (gastroesophageal reflux disease) 06/03/2017  . Anxiety 06/03/2017  . Chronic combined systolic (congestive) and diastolic (congestive) heart failure ([place]) 06/03/2017  . Hypokalemia 06/03/2017  . Diverticulitis large intestine 06/03/2017  . Lower abdominal pain 06/03/2017  . Non-intractable vomiting with nausea   . Abnormal CT of the abdomen   . Generalized abdominal pain   . LFT elevation   . Diverticulitis of colon 05/18/2017  . Symptomatic cholelithiasis 02/10/2017  . Hypoxia 01/11/2017  . Dyspnea 01/11/2017  . Dysphagia 04/04/2015  . History of esophageal stricture 04/04/2015  . Morbid (severe) obesity due to excess calories ([place]) 04/01/2015  . ACE-inhibitor  cough 03/12/2015  . Upper airway cough syndrome 03/12/2015  . Laryngopharyngeal reflux (LPR) 03/12/2015  . Elevated lipids 07/18/2014  . Chest pain 07/18/2014  . Internal hemorrhoids 04/25/2014  . Ureteral stone with hydronephrosis 04/22/2014  . Post-op pain 04/21/2014  . Rectocele, grade 3 02/20/2014  . Female rectocele without uterine prolapse 08/04/2013  . Unspecified constipation 07/23/2013  . Chronic respiratory failure with hypoxia ([place]) 06/07/2013  . Cystocele 06/06/2013  . Anosmia 01/25/2013  . Unspecified nutritional deficiency   . Polyneuropathy in other diseases classified elsewhere ([place])   . OSA (obstructive sleep apnea)   . Cough 07/27/2012  . Essential hypertension     Past Surgical  History:  Procedure Laterality Date  . ANTERIOR AND POSTERIOR REPAIR N/A 06/06/2013   Procedure: Anterior vaginal vault repair, Sacrospinous ligament fixation with UPHOLD lite, Kelly plication, Sacrospinous mesh fixation, Solyx transurethral sling;  Surgeon: Ailene Rud, MD;  Location: [facility];  Service: Urology;  Laterality: N/A;  . CHOLECYSTECTOMY N/A 02/10/2017   Procedure: LAPAROSCOPIC CHOLECYSTECTOMY;  Surgeon: Coralie Keens, MD;  Location: [facility];  Service: General;  Laterality: N/A;  . COLECTOMY WITH COLOSTOMY CREATION/HARTMANN PROCEDURE N/A 06/11/2017   Procedure: OPEN SIGMOID COLECTOMY;  Surgeon: [name], MD;  Location[facility];  Service: General;  Laterality: N/A;  . CYSTOSCOPY W/ URETERAL STENT PLACEMENT Right 04/21/2014   Procedure: CYSTOSCOPY WITH RETROGRADE PYELOGRAM/URETERAL STENT PLACEMENT;  Surgeon: Ailene Rud, MD;  Location: [facility];  Service: Urology;  Laterality: Right;  . CYSTOSCOPY W/ URETERAL STENT PLACEMENT Right 11/21/2014   Procedure: CYSTOSCOPY WITH RETROGRADE PYELOGRAM, URETEROSCOPY WITH STONE REMOVAL, URETERAL STENT PLACEMENT;  Surgeon: Carolan Clines, MD;  Location: [facility];  Service: Urology;  Laterality: Right;  . CYSTOSCOPY W/ URETERAL STENT PLACEMENT Left 02/01/2016   Procedure: CYSTO, LEFT URETEROSCOPY, LEFT RETROGRADE AND LEFT URETERAL STENT PLACEMENT;  Surgeon: Carolan Clines, MD;  Location: [facility];  Service: Urology;  Laterality: Left;  . CYSTOSCOPY WITH RETROGRADE PYELOGRAM, URETEROSCOPY AND STENT PLACEMENT Right 06/05/2014   Procedure: CYSTOSCOPY WITH RIGHT RETROGRADE PYELOGRAM, RIGHT URETEROSCOPY, STONE EXTRACTION AND RIGHT DOUBLE J STENT PLACEMENT;  Surgeon: Ailene Rud, MD;  Location: [facility];  Service: Urology;  Laterality: Right;  . ESOPHAGEAL DILATION  X2  LAST ONE  JUNE 2014   has had 9 of these  . ESOPHAGEAL MANOMETRY N/A 09/10/2015   Procedure: ESOPHAGEAL MANOMETRY (EM);   Surgeon: Manus Gunning, MD;  [facility];  Service: Gastroenterology;  Laterality: N/A;  . HOLMIUM LASER APPLICATION Right 44/96/7591   Procedure: HOLMIUM LASER OF STONE ;  Surgeon: Ailene Rud, MD;  Location: [facility];  Service: Urology;  Laterality: Right;  . KNEE ARTHROSCOPY Bilateral 2008  . LAPAROSCOPIC CHOLECYSTECTOMY  02/10/2017  . MULTIPLE TOOTH EXTRACTIONS     past hx  . RECTOCELE REPAIR N/A 02/20/2014   Procedure: POSTERIOR REPAIR (RECTOCELE) WITH VAGINAL VAULT REPAIR WITH ACELL GRAFT PLACEMENT, PERINEOPLASTY, ENTEROCELE REPAIR;  Surgeon: Ailene Rud, MD;  Location: [facility];  Service: Urology;  Laterality: N/A;  . TUBAL LIGATION  1987     OB History   No obstetric history on file.      Home Medications    Prior to Admission medications   Medication Sig Start Date End Date Taking? Authorizing Provider  amLODipine (NORVASC) 10 MG tablet Take 10 mg by mouth daily.     [provider]  atorvastatin (LIPITOR) 10 MG tablet Take 10 mg by mouth at bedtime.  05/15/17   [provider]  budesonide-formoterol (SYMBICORT) 160-4.5 MCG/ACT inhaler Inhale 2 puffs into the lungs 2 (two) times daily. 07/13/18 07/13/19  [provider]  chlorpheniramine (CHLOR-TRIMETON) 4 MG tablet Take 1 tablet (4 mg total) by mouth at bedtime. Patient taking differently: Take 4-8 mg by mouth at bedtime.  03/08/15   Chesley Mires, MD  clonazePAM (KLONOPIN) 0.5 MG tablet Take 0.5 mg by mouth 2 (two) times daily as needed for anxiety.     [provider]  DULoxetine (CYMBALTA) 30 MG capsule Take 30 mg by mouth daily. 07/13/18   [provider]  fluconazole (DIFLUCAN) 200 MG tablet Take 2 tablets by mouth day one, take one tablet by mouth daily until completed. 08/05/18   Mansouraty, Telford Nab., MD  furosemide (LASIX) 40 MG tablet Take 0.5 tablets (20 mg total) by mouth daily. Patient taking differently: Take 20 mg by mouth  daily as needed (for swelling/fluid retention.).  05/28/17   Domenic Polite, MD  hyoscyamine (LEVBID) 0.375 MG 12 hr tablet Take 1 tablet (0.375 mg total) by mouth every 12 (twelve) hours as needed for cramping. Patient not taking: Reported on 07/29/2018 05/28/17   Domenic Polite, MD  ibuprofen (ADVIL,MOTRIN) 200 MG tablet Take 200 mg by mouth every 6 (six) hours as needed for moderate pain.    [provider]  losartan (COZAAR) 50 MG tablet Take 50 mg by mouth daily.    [provider]  metoprolol succinate (TOPROL-XL) 50 MG 24 hr tablet Take 50 mg by mouth at bedtime.  08/08/15   [provider]  omeprazole (PRILOSEC) 40 MG capsule TAKE 1 CAPSULE(40 MG) BY MOUTH TWICE DAILY Patient taking differently: Take 40 mg by mouth 2 (two) times daily.  09/17/16   Armbruster, [name], MD  OXYGEN Inhale 4 L into the lungs See admin instructions. With CPAP continuously  and During Waking Hours as needed    [provider]  polyethylene glycol (MIRALAX / GLYCOLAX) packet Take 17 g by mouth daily as needed for moderate constipation. Patient taking differently: Take 17 g by mouth daily.  01/16/17   Bonnielee Haff, MD  topiramate (TOPAMAX) 50 MG tablet Take 50 mg by mouth 2 (two) times daily.    [provider]  traZODone (DESYREL) 50 MG tablet Take 50 mg by mouth at bedtime. 05/20/18   [provider]  UNABLE TO FIND Med Name: CPAP    [provider]    Family History Family History  Problem Relation Age of Onset  . Colon cancer Father 63       died at 36  . Heart disease Mother        died at 57  . Heart attack Mother   . Heart attack Sister        died at 93  . Hypertension Brother   . Heart attack Cousin 50       with death  . Heart attack Cousin 4       died with MI  . Esophageal cancer Neg Hx   . Rectal cancer Neg Hx   . Stomach cancer Neg Hx   . Colon polyps Neg Hx     Social History Social History   Tobacco Use  .  Smoking status: Never Smoker  . Smokeless tobacco: Never Used  Substance Use Topics  . Alcohol use: No    Alcohol/week: 0.0 standard drinks  . Drug use: No     Allergies   Lisinopril;  Doxycycline; and Metronidazole   Review of Systems Review of Systems  All other systems reviewed and are negative.    Physical Exam Updated Vital Signs BP (!) 148/77 (BP Location: Right Arm)   Pulse 63   Temp 98 F (36.7 C) (Oral)   Resp 15   Ht 1.575 m (5\' 2" )   Wt 105.7 kg   SpO2 97%   BMI 42.62 kg/m   Physical Exam Vitals signs and nursing note reviewed.  Constitutional:      General: She is not in acute distress.    Appearance: Normal appearance. She is well-developed. She is not toxic-appearing.  HENT:     Head: Normocephalic and atraumatic.  Eyes:     General: Lids are normal.     Conjunctiva/sclera: Conjunctivae normal.     Pupils: Pupils are equal, round, and reactive to light.  Neck:     Musculoskeletal: Normal range of motion and neck supple. No spinous process tenderness or muscular tenderness.     Thyroid: No thyroid mass.     Trachea: No tracheal deviation.  Cardiovascular:     Rate and Rhythm: Normal rate and regular rhythm.     Heart sounds: Normal heart sounds. No murmur. No gallop.   Pulmonary:     Effort: Pulmonary effort is normal. No respiratory distress.     Breath sounds: Normal breath sounds. No stridor. No decreased breath sounds, wheezing, rhonchi or rales.  Abdominal:     General: Bowel sounds are normal. There is no distension.     Palpations: Abdomen is soft.     Tenderness: There is no abdominal tenderness. There is no rebound.  Musculoskeletal: Normal range of motion.        General: No tenderness.  Skin:    General: Skin is warm and dry.     Findings: Rash present. No abrasion. Rash is macular.  Neurological:     Mental Status: She is alert and oriented to person, place, and time.     GCS: GCS eye subscore is 4. GCS verbal subscore is 5. GCS  motor subscore is 6.     Cranial Nerves: No cranial nerve deficit or dysarthria.     Sensory: No sensory deficit.     Motor: No tremor or seizure activity.  Psychiatric:        Speech: Speech normal.        Behavior: Behavior normal.      ED Treatments / Results  Labs (all labs ordered are listed, but only abnormal results are displayed) Labs Reviewed  CBC - Abnormal; Notable for the following components:      Result Value   RBC 3.60 (*)    MCV 106.1 (*)    All other components within normal limits  CULTURE, BLOOD (ROUTINE X 2)  CULTURE, BLOOD (ROUTINE X 2)  LACTIC ACID, PLASMA  DIFFERENTIAL  CBC WITH DIFFERENTIAL/PLATELET    EKG EKG Interpretation  Date/Time:  Sunday August 08 2018 22:50:22 EST Ventricular Rate:  68 PR Interval:    QRS Duration: 96 QT Interval:  414 QTC Calculation: 441 R Axis:   59 Text Interpretation:  Sinus rhythm Low voltage, precordial leads Confirmed by Lacretia Leigh ([id number]) on 08/08/2018 11:17:07 PM   Radiology Ct Head Wo Contrast  Result Date: 08/08/2018 CLINICAL DATA:  63 year old female with acute headache for 3 days. EXAM: CT HEAD WITHOUT CONTRAST TECHNIQUE: Contiguous axial images were obtained from the base of the skull through the vertex without intravenous contrast. COMPARISON:  06/25/2018  and prior CTs FINDINGS: Brain: No evidence of acute infarction, hemorrhage, hydrocephalus, extra-axial collection or mass lesion/mass effect. Vascular: No hyperdense vessel or unexpected calcification. Skull: Normal. Negative for fracture or focal lesion. Sinuses/Orbits: No acute finding. Other: None. IMPRESSION: No evidence of acute intracranial abnormality. Electronically Signed   By: Margarette Canada M.D.   On: 08/08/2018 21:09    Procedures Procedures (including critical care time)  Medications Ordered in ED Medications  0.9 %  sodium chloride infusion ( Intravenous New Bag/Given 08/08/18 2039)  sodium chloride 0.9 % bolus 1,000 mL (1,000 mLs  Intravenous New Bag/Given 08/08/18 2039)  metoCLOPramide (REGLAN) injection 10 mg (10 mg Intravenous Given 08/08/18 2042)  diphenhydrAMINE (BENADRYL) injection 25 mg (25 mg Intravenous Given 08/08/18 2040)  dexamethasone (DECADRON) injection 10 mg (10 mg Intravenous Given 08/08/18 2041)  diphenhydrAMINE (BENADRYL) injection 25 mg (25 mg Intravenous Given 08/08/18 2150)  famotidine (PEPCID) IVPB 20 mg premix (20 mg Intravenous New Bag/Given 08/08/18 2149)  morphine 4 MG/ML injection 6 mg (6 mg Intravenous Given 08/08/18 2150)  diphenhydrAMINE (BENADRYL) injection 25 mg (25 mg Intravenous Given 08/08/18 2252)     Initial Impression / Assessment and Plan / ED Course  I have reviewed the triage vital signs and the nursing notes.  Pertinent labs & imaging results that were available during my care of the patient were reviewed by me and considered in my medical decision making (see chart for details).     Patient medicated for possible allergic reaction here.  Patient placed in negative pressure room in case this is meningitis.  She has been afebrile.  Her rash is blanching.  Does not have any intraoral involvement.  Treated with Decadron as well as Pepcid.  Rash did somewhat improve.  She has no nuchal rigidity at this time.  Head CT was negative.  Delay with labs noted.  Patient's CBC shows no evidence of thrombocytopenia.  She has no leukocytosis.  Lactate is normal.  Electrolytes are pending at this time.  Low likelihood for patient have meningitis although it is a possibility.  Suspect that she has a drug rash.  case has been seen by Dr. Ellender Hose and he has assumed care of the patient will perform LP.  Final Clinical Impressions(s) / ED Diagnoses   Final diagnoses:  None    ED Discharge Orders    None       Lacretia Leigh, MD 08/08/18 2359

## 2018-08-08 NOTE — ED Triage Notes (Addendum)
Pt present with petechia and red rash over body, itching and headache with symptoms. Yeast in esophagus after surgery week. Took Benadryl this morning without relief

## 2018-08-09 ENCOUNTER — Emergency Department (HOSPITAL_COMMUNITY): Payer: Medicaid Other

## 2018-08-09 ENCOUNTER — Encounter (HOSPITAL_COMMUNITY): Payer: Self-pay | Admitting: Internal Medicine

## 2018-08-09 DIAGNOSIS — R51 Headache: Secondary | ICD-10-CM | POA: Diagnosis not present

## 2018-08-09 DIAGNOSIS — I10 Essential (primary) hypertension: Secondary | ICD-10-CM | POA: Diagnosis not present

## 2018-08-09 DIAGNOSIS — R21 Rash and other nonspecific skin eruption: Secondary | ICD-10-CM | POA: Diagnosis not present

## 2018-08-09 DIAGNOSIS — L282 Other prurigo: Secondary | ICD-10-CM | POA: Diagnosis present

## 2018-08-09 DIAGNOSIS — R519 Headache, unspecified: Secondary | ICD-10-CM | POA: Diagnosis present

## 2018-08-09 LAB — CSF CELL COUNT WITH DIFFERENTIAL
RBC Count, CSF: 0 /mm3
RBC Count, CSF: 2 /mm3 — ABNORMAL HIGH
TUBE #: 1
Tube #: 4
WBC, CSF: 1 /mm3 (ref 0–5)
WBC, CSF: 1 /mm3 (ref 0–5)

## 2018-08-09 LAB — HEPATIC FUNCTION PANEL
ALT: 31 U/L (ref 0–44)
AST: 40 U/L (ref 15–41)
Albumin: 3.3 g/dL — ABNORMAL LOW (ref 3.5–5.0)
Alkaline Phosphatase: 58 U/L (ref 38–126)
Bilirubin, Direct: 0.1 mg/dL (ref 0.0–0.2)
Indirect Bilirubin: 0.1 mg/dL — ABNORMAL LOW (ref 0.3–0.9)
Total Bilirubin: 0.2 mg/dL — ABNORMAL LOW (ref 0.3–1.2)
Total Protein: 6.5 g/dL (ref 6.5–8.1)

## 2018-08-09 LAB — RESPIRATORY PANEL BY PCR
ADENOVIRUS-RVPPCR: NOT DETECTED
Bordetella pertussis: NOT DETECTED
Chlamydophila pneumoniae: NOT DETECTED
Coronavirus 229E: NOT DETECTED
Coronavirus HKU1: NOT DETECTED
Coronavirus NL63: NOT DETECTED
Coronavirus OC43: NOT DETECTED
Influenza A: NOT DETECTED
Influenza B: NOT DETECTED
Metapneumovirus: NOT DETECTED
Mycoplasma pneumoniae: NOT DETECTED
PARAINFLUENZA VIRUS 4-RVPPCR: NOT DETECTED
Parainfluenza Virus 1: NOT DETECTED
Parainfluenza Virus 2: NOT DETECTED
Parainfluenza Virus 3: NOT DETECTED
Respiratory Syncytial Virus: NOT DETECTED
Rhinovirus / Enterovirus: NOT DETECTED

## 2018-08-09 LAB — CBC WITH DIFFERENTIAL/PLATELET
ABS IMMATURE GRANULOCYTES: 0.01 10*3/uL (ref 0.00–0.07)
Basophils Absolute: 0 10*3/uL (ref 0.0–0.1)
Basophils Relative: 0 %
Eosinophils Absolute: 0 10*3/uL (ref 0.0–0.5)
Eosinophils Relative: 0 %
HCT: 40.3 % (ref 36.0–46.0)
Hemoglobin: 12.6 g/dL (ref 12.0–15.0)
Immature Granulocytes: 0 %
Lymphocytes Relative: 11 %
Lymphs Abs: 0.6 10*3/uL — ABNORMAL LOW (ref 0.7–4.0)
MCH: 33.2 pg (ref 26.0–34.0)
MCHC: 31.3 g/dL (ref 30.0–36.0)
MCV: 106.3 fL — ABNORMAL HIGH (ref 80.0–100.0)
MONO ABS: 0 10*3/uL — AB (ref 0.1–1.0)
Monocytes Relative: 1 %
Neutro Abs: 4.6 10*3/uL (ref 1.7–7.7)
Neutrophils Relative %: 88 %
Platelets: 245 10*3/uL (ref 150–400)
RBC: 3.79 MIL/uL — ABNORMAL LOW (ref 3.87–5.11)
RDW: 13.7 % (ref 11.5–15.5)
WBC: 5.2 10*3/uL (ref 4.0–10.5)
nRBC: 0 % (ref 0.0–0.2)

## 2018-08-09 LAB — BASIC METABOLIC PANEL
Anion gap: 5 (ref 5–15)
BUN: 16 mg/dL (ref 6–20)
CO2: 22 mmol/L (ref 22–32)
Calcium: 8.1 mg/dL — ABNORMAL LOW (ref 8.9–10.3)
Chloride: 111 mmol/L (ref 98–111)
Creatinine, Ser: 0.96 mg/dL (ref 0.44–1.00)
GFR calc Af Amer: >60 mL/min (ref >60)
GFR calc non Af Amer: >60 mL/min (ref >60)
Glucose, Bld: 218 mg/dL — ABNORMAL HIGH (ref 70–99)
Potassium: 4.6 mmol/L (ref 3.5–5.1)
SODIUM: 138 mmol/L (ref 135–145)

## 2018-08-09 LAB — COMPREHENSIVE METABOLIC PANEL
ALT: 34 U/L (ref 0–44)
AST: 60 U/L — AB (ref 15–41)
Albumin: 3.2 g/dL — ABNORMAL LOW (ref 3.5–5.0)
Alkaline Phosphatase: 60 U/L (ref 38–126)
Anion gap: 4 — ABNORMAL LOW (ref 5–15)
BUN: 17 mg/dL (ref 6–20)
CHLORIDE: 110 mmol/L (ref 98–111)
CO2: 24 mmol/L (ref 22–32)
Calcium: 8.1 mg/dL — ABNORMAL LOW (ref 8.9–10.3)
Creatinine, Ser: 1.06 mg/dL — ABNORMAL HIGH (ref 0.44–1.00)
GFR calc Af Amer: >60 mL/min (ref >60)
GFR calc non Af Amer: 57 mL/min — ABNORMAL LOW (ref >60)
Glucose, Bld: 182 mg/dL — ABNORMAL HIGH (ref 70–99)
Potassium: 5.2 mmol/L — ABNORMAL HIGH (ref 3.5–5.1)
Sodium: 138 mmol/L (ref 135–145)
Total Bilirubin: 0.7 mg/dL (ref 0.3–1.2)
Total Protein: 6.8 g/dL (ref 6.5–8.1)

## 2018-08-09 LAB — C-REACTIVE PROTEIN: CRP: 0.5 mg/dL (ref <1.0)

## 2018-08-09 LAB — HIV ANTIBODY (ROUTINE TESTING W REFLEX): HIV Screen 4th Generation wRfx: NONREACTIVE

## 2018-08-09 LAB — GLUCOSE, CSF: GLUCOSE CSF: 64 mg/dL (ref 40–70)

## 2018-08-09 LAB — SEDIMENTATION RATE: Sed Rate: 8 mm/hr (ref 0–22)

## 2018-08-09 LAB — CRYPTOCOCCAL ANTIGEN, CSF: Crypto Ag: NEGATIVE

## 2018-08-09 LAB — PROTEIN, CSF: Total  Protein, CSF: 30 mg/dL (ref 15–45)

## 2018-08-09 LAB — GLUCOSE, CAPILLARY: Glucose-Capillary: 148 mg/dL — ABNORMAL HIGH (ref 70–99)

## 2018-08-09 MED ORDER — TOPIRAMATE 25 MG PO TABS
50.0000 mg | ORAL_TABLET | Freq: Two times a day (BID) | ORAL | Status: DC
  Administered 2018-08-09 – 2018-08-11 (×5): 50 mg via ORAL
  Filled 2018-08-09 (×5): qty 2

## 2018-08-09 MED ORDER — AMLODIPINE BESYLATE 10 MG PO TABS
10.0000 mg | ORAL_TABLET | Freq: Every day | ORAL | Status: DC
  Administered 2018-08-09 – 2018-08-12 (×4): 10 mg via ORAL
  Filled 2018-08-09 (×4): qty 1

## 2018-08-09 MED ORDER — MORPHINE SULFATE (PF) 2 MG/ML IV SOLN
2.0000 mg | INTRAVENOUS | Status: DC | PRN
  Administered 2018-08-11 – 2018-08-12 (×5): 2 mg via INTRAVENOUS
  Filled 2018-08-09 (×5): qty 1

## 2018-08-09 MED ORDER — ACETAMINOPHEN 325 MG PO TABS
650.0000 mg | ORAL_TABLET | Freq: Four times a day (QID) | ORAL | Status: DC | PRN
  Administered 2018-08-09: 650 mg via ORAL
  Filled 2018-08-09: qty 2

## 2018-08-09 MED ORDER — METOPROLOL SUCCINATE ER 50 MG PO TB24
50.0000 mg | ORAL_TABLET | Freq: Every day | ORAL | Status: DC
  Administered 2018-08-09 – 2018-08-11 (×3): 50 mg via ORAL
  Filled 2018-08-09 (×3): qty 1

## 2018-08-09 MED ORDER — CLONAZEPAM 0.5 MG PO TABS
0.5000 mg | ORAL_TABLET | Freq: Two times a day (BID) | ORAL | Status: DC | PRN
  Administered 2018-08-11: 0.5 mg via ORAL
  Filled 2018-08-09: qty 1

## 2018-08-09 MED ORDER — PREDNISONE 20 MG PO TABS
40.0000 mg | ORAL_TABLET | Freq: Every day | ORAL | Status: DC
  Administered 2018-08-09 – 2018-08-12 (×4): 40 mg via ORAL
  Filled 2018-08-09 (×4): qty 2

## 2018-08-09 MED ORDER — SODIUM CHLORIDE 0.9 % IV SOLN
INTRAVENOUS | Status: DC
  Administered 2018-08-09 (×2): via INTRAVENOUS

## 2018-08-09 MED ORDER — DIPHENHYDRAMINE HCL 50 MG/ML IJ SOLN
25.0000 mg | Freq: Four times a day (QID) | INTRAMUSCULAR | Status: DC | PRN
  Administered 2018-08-09 – 2018-08-10 (×4): 25 mg via INTRAVENOUS
  Filled 2018-08-09 (×4): qty 1

## 2018-08-09 MED ORDER — PROMETHAZINE HCL 25 MG/ML IJ SOLN
25.0000 mg | Freq: Once | INTRAMUSCULAR | Status: AC
  Administered 2018-08-09: 25 mg via INTRAVENOUS
  Filled 2018-08-09: qty 1

## 2018-08-09 MED ORDER — FAMOTIDINE IN NACL 20-0.9 MG/50ML-% IV SOLN
20.0000 mg | Freq: Two times a day (BID) | INTRAVENOUS | Status: DC
  Administered 2018-08-09: 20 mg via INTRAVENOUS
  Filled 2018-08-09: qty 50

## 2018-08-09 MED ORDER — METHYLPREDNISOLONE SODIUM SUCC 40 MG IJ SOLR
40.0000 mg | Freq: Every day | INTRAMUSCULAR | Status: DC
  Administered 2018-08-09: 40 mg via INTRAVENOUS
  Filled 2018-08-09: qty 1

## 2018-08-09 MED ORDER — HYDRALAZINE HCL 20 MG/ML IJ SOLN: 10.0000 mg | INTRAMUSCULAR | Status: DC | PRN

## 2018-08-09 MED ORDER — ACETAZOLAMIDE SODIUM 500 MG IJ SOLR
500.0000 mg | Freq: Two times a day (BID) | INTRAMUSCULAR | Status: DC
  Administered 2018-08-09 – 2018-08-11 (×4): 500 mg via INTRAVENOUS
  Filled 2018-08-09 (×5): qty 500

## 2018-08-09 MED ORDER — ACETAMINOPHEN 650 MG RE SUPP: 650.0000 mg | Freq: Four times a day (QID) | RECTAL | Status: DC | PRN

## 2018-08-09 MED ORDER — FAMOTIDINE 20 MG PO TABS
20.0000 mg | ORAL_TABLET | Freq: Two times a day (BID) | ORAL | Status: DC
  Administered 2018-08-09 – 2018-08-12 (×6): 20 mg via ORAL
  Filled 2018-08-09 (×6): qty 1

## 2018-08-09 MED ORDER — PANTOPRAZOLE SODIUM 40 MG PO TBEC
40.0000 mg | DELAYED_RELEASE_TABLET | Freq: Every day | ORAL | Status: DC
  Administered 2018-08-09 – 2018-08-12 (×4): 40 mg via ORAL
  Filled 2018-08-09 (×4): qty 1

## 2018-08-09 MED ORDER — BUTALBITAL-APAP-CAFFEINE 50-325-40 MG PO TABS
1.0000 | ORAL_TABLET | ORAL | Status: DC | PRN
  Administered 2018-08-09 – 2018-08-12 (×8): 1 via ORAL
  Filled 2018-08-09 (×8): qty 1

## 2018-08-09 MED ORDER — DIPHENHYDRAMINE HCL 50 MG/ML IJ SOLN
25.0000 mg | Freq: Once | INTRAMUSCULAR | Status: AC
  Administered 2018-08-09: 25 mg via INTRAVENOUS
  Filled 2018-08-09: qty 1

## 2018-08-09 MED ORDER — HYDRALAZINE HCL 20 MG/ML IJ SOLN: 5.0000 mg | INTRAMUSCULAR | Status: DC | PRN

## 2018-08-09 MED ORDER — FUROSEMIDE 10 MG/ML IJ SOLN
40.0000 mg | Freq: Once | INTRAMUSCULAR | Status: AC
  Administered 2018-08-09: 40 mg via INTRAVENOUS
  Filled 2018-08-09: qty 4

## 2018-08-09 NOTE — ED Provider Notes (Addendum)
Assumed care from Dr. Zenia Resides at 7:08 AM. Briefly, the patient is a 63 y.o. female with PMHx of  has a past medical history of Adenoma of colon, Allergy, Anemia, Anxiety, Arthritis, Borderline diabetes, Chronic cystitis, Complication of anesthesia, Diverticulitis, Diverticulosis of colon, Dyspnea, Family history of adverse reaction to anesthesia, GERD (gastroesophageal reflux disease), H/O hiatal hernia, Headache(784.0), History of chronic cough, History of esophageal dilatation, History of kidney stones, History of MRSA infection, History of recurrent UTIs, History of TIAs (NO RESIDUAL), Hyperlipidemia, Hypertension, Kidney disease, Neuromuscular disorder ([place]), Neuropathy, Nocturia, OSA on CPAP, Osteoarthritis of knee, Sleep apnea, Stroke ([place]) ([phone]), and SUI (stress urinary incontinence, female). here with multiple complaints.  Primary complaint is diffuse rash, headache, and reported altered mental status.  Initial lab work delayed due to clotting.  Plan at time of signout is to perform lumbar puncture, and likely admit..   Labs Reviewed  CBC - Abnormal; Notable for the following components:      Result Value   RBC 3.60 (*)    MCV 106.1 (*)    All other components within normal limits  CSF CELL COUNT WITH DIFFERENTIAL - Abnormal; Notable for the following components:   RBC Count, CSF 2 (*)    All other components within normal limits  COMPREHENSIVE METABOLIC PANEL - Abnormal; Notable for the following components:   Potassium 5.2 (*)    Glucose, Bld 182 (*)    Creatinine, Ser 1.06 (*)    Calcium 8.1 (*)    Albumin 3.2 (*)    AST 60 (*)    GFR calc non Af Amer 57 (*)    Anion gap 4 (*)    All other components within normal limits  CBC WITH DIFFERENTIAL/PLATELET - Abnormal; Notable for the following components:   RBC 3.79 (*)    MCV 106.3 (*)    Lymphs Abs 0.6 (*)    Monocytes Absolute 0.0 (*)    All other components within normal limits  CSF CULTURE  CULTURE, BLOOD (ROUTINE X  2)  CULTURE, BLOOD (ROUTINE X 2)  GRAM STAIN  CULTURE, FUNGUS WITHOUT SMEAR  FUNGUS CULTURE, BLOOD  RESPIRATORY PANEL BY PCR  LACTIC ACID, PLASMA  DIFFERENTIAL  CSF CELL COUNT WITH DIFFERENTIAL  GLUCOSE, CSF  PROTEIN, CSF  CBC WITH DIFFERENTIAL/PLATELET  CRYPTOCOCCAL ANTIGEN, CSF  HIV ANTIBODY (ROUTINE TESTING W REFLEX)  RPR  BASIC METABOLIC PANEL  HEPATIC FUNCTION PANEL  SEDIMENTATION RATE  C-REACTIVE PROTEIN  URINALYSIS, ROUTINE W REFLEX MICROSCOPIC    Course of Care: -On my assessment, the patient does have a morbilliform, diffuse, rash that is not consistent with an urticarial rash.  I do not suspect SJS or TENS and she has no mucosal involvement.  Differential includes viral exanthem, drug eruption, less likely disseminated fungal infection.  LP performed by myself, tolerated well.  Opening pressure is 26.  Patient has persistent, diffuse rash so will admit for observation.  Blood culture and fungal culture sent. -Also discussed case with Neuro - unlikely meningitis, encephalitis given normal CSF studies. PRessure is likely not related given it is only minimally elevated.   .Lumbar Puncture Date/Time: 08/09/2018 7:08 AM Performed by: Duffy Bruce, MD Authorized by: Duffy Bruce, MD   Consent:    Consent obtained:  Written   Consent given by:  Patient   Risks discussed:  Bleeding, headache, infection, nerve damage, pain and repeat procedure   Alternatives discussed:  Alternative treatment Pre-procedure details:    Procedure purpose:  Diagnostic   Preparation: Patient was  prepped and draped in usual sterile fashion   Sedation:    Sedation type:  Anxiolysis Anesthesia (see MAR for exact dosages):    Anesthesia method:  None Procedure details:    Lumbar space:  L4-L5 interspace   Patient position:  L lateral decubitus   Needle gauge:  24   Needle type:  Spinal needle - Quincke tip   Needle length (in):  3.5   Ultrasound guidance: no     Number of attempts:  1    Opening pressure (cm H2O):  26   Fluid appearance:  Clear   Tubes of fluid:  4   Total volume (ml):  8 Post-procedure:    Puncture site:  Adhesive bandage applied   Patient tolerance of procedure:  Tolerated well, no immediate complications      Duffy Bruce, MD 08/09/18 1062    Duffy Bruce, MD 08/09/18 [phone]

## 2018-08-09 NOTE — Progress Notes (Signed)
Patient unable to wear cpap due to brusing on her nose. Encouraged patient if she changes her mind to let the RN know so they could call me. Patient will be wearing oxygen tonight.

## 2018-08-09 NOTE — H&P (Addendum)
History and Physical    Ann [name] SEG:[id number] DOB: 05/27/1956 DOA: 08/08/2018  PCP: Berkley Harvey, NP  Patient coming from: Home.  Chief Complaint: Diffuse rash headache and confusion.  HPI: Ann [name] is a 63 y.o. female with history of hypertension, sleep apnea, chronic systolic and diastolic CHF, esophageal stricture recent dilatation done 2 weeks ago presents to the ER after patient started noticing diffuse rash all over body including the face since yesterday morning.  Patient states she has history of migraines but over the last 1 week has been having diffuse headache which was different from her usual migraine.  Subsequent that patient started to develop itching all over the body.  Last 24 hours patient noticed rash.  Patient states he did have a esophageal dilatation done 2 weeks ago by Dr. Rush Landmark and was prescribed fluconazole for esophageal candidiasis seen in the biopsy but has not taken it yet.  Otherwise patient has not taken any new medications.  Usually eats bourbon chicken, [employer] which is not new for her.  Otherwise has not taken any new medications or any new detergents or has not had any insect bite or recent travel or sick contacts.  Has not noticed any fever chills.  Headache is diffuse with some photophobia but no neck pain.  No cough or productive sputum no nausea vomiting or diarrhea.  Denies any chest pain.  No joint pains.  ED Course: In the ER patient was afebrile.  Labs showed WBC of 6.1 creatinine was around 1.06 potassium 5.2 glucose 182 UA is pending CT head unremarkable EKG shows normal sinus rhythm nonspecific changes chest x-ray unremarkable patient had lumbar puncture done so far WBC count is only 1 remaining all pending.  Opening pressure was 26 mm for which ER physician had discussed with neurologist.  At this time no specific recommendation from neurologist.  Patient was given Decadron Benadryl and Pepcid following which itching improved.   Rash persists.  Rash is appearing maculopapular nonblanching.  Has not involve the oral mucosa.  The rash on the upper back is mildly tender.  No blebs or vesicles.  Patient admitted for further management.  Per daughter patient also was appearing confused at times over the last for 5 days.  Review of Systems: As per HPI, rest all negative.   Past Medical History:  Diagnosis Date  . Adenoma of colon   . Allergy    seasonal  . Anemia    with past pregnancy -not recent  . Anxiety   . Arthritis   . Borderline diabetes   . Chronic cystitis   . Complication of anesthesia    02-20-2014 ([facility]) INTRA-OP RESPIRATORY FAILURE SECONDARY TO POSSIBLE MUCOUS ASPIRATION/  ALSO 06-06-2013 [facility]) POST-OP DESATURATION  EVEN USING CPAP, States some issues if Propofol given to rapidly.  . Diverticulitis   . Diverticulosis of colon   . Dyspnea   . Family history of adverse reaction to anesthesia    PER PT SISTER DIED AFTER GENERAL ANESTHESIA DUE TO UNDIAGNOSED OSA  . GERD (gastroesophageal reflux disease)   . H/O hiatal hernia   . Headache(784.0)    migraines, decreased since turning 50's  . History of chronic cough    "tight sounding cough"  . History of esophageal dilatation   . History of kidney stones   . History of MRSA infection    3/ 2015  AXILLARY ABSCESS  . History of recurrent UTIs   . History of TIAs NO RESIDUAL  1980;  2005;   2008 PT STATES PER CT  SCARRING RIGHT SIDE OF BRAIN  . Hyperlipidemia   . Hypertension   . Kidney disease    III  . Neuromuscular disorder ([facility])    HH  . Neuropathy    pt denies this 07-05-2015  . Nocturia   . OSA on CPAP    PER SLEEP STUDY 09-08-2012  MODERATE OSA. not used in awhile, but thinks needs to start back using due to some weight gain.  . Osteoarthritis of knee    Right  . Sleep apnea   . Stroke [facility]) [phone]   TIA'S  . SUI (stress urinary incontinence, female)     Past Surgical History:  Procedure Laterality Date  . ANTERIOR AND  POSTERIOR REPAIR N/A 06/06/2013   Procedure: Anterior vaginal vault repair, Sacrospinous ligament fixation with UPHOLD lite, Kelly plication, Sacrospinous mesh fixation, Solyx transurethral sling;  Surgeon: Ailene Rud, MD;  Location: [facility];  Service: Urology;  Laterality: N/A;  . CHOLECYSTECTOMY N/A 02/10/2017   Procedure: LAPAROSCOPIC CHOLECYSTECTOMY;  Surgeon: Coralie Keens, MD;  Location: [place] [facility];  Service: General;  Laterality: N/A;  . COLECTOMY WITH COLOSTOMY CREATION/HARTMANN PROCEDURE N/A 06/11/2017   Procedure: OPEN SIGMOID COLECTOMY;  Surgeon: [name], MD;  Location: [facility];  Service: General;  Laterality: N/A;  . CYSTOSCOPY W/ URETERAL STENT PLACEMENT Right 04/21/2014   Procedure: CYSTOSCOPY WITH RETROGRADE PYELOGRAM/URETERAL STENT PLACEMENT;  Surgeon: Ailene Rud, MD;  Location: [facility];  Service: Urology;  Laterality: Right;  . CYSTOSCOPY W/ URETERAL STENT PLACEMENT Right 11/21/2014   Procedure: CYSTOSCOPY WITH RETROGRADE PYELOGRAM, URETEROSCOPY WITH STONE REMOVAL, URETERAL STENT PLACEMENT;  Surgeon: Carolan Clines, MD;  Location: [facility];  Service: Urology;  Laterality: Right;  . CYSTOSCOPY W/ URETERAL STENT PLACEMENT Left 02/01/2016   Procedure: CYSTO, LEFT URETEROSCOPY, LEFT RETROGRADE AND LEFT URETERAL STENT PLACEMENT;  Surgeon: Carolan Clines, MD;  Location: [facility];  Service: Urology;  Laterality: Left;  . CYSTOSCOPY WITH RETROGRADE PYELOGRAM, URETEROSCOPY AND STENT PLACEMENT Right 06/05/2014   Procedure: CYSTOSCOPY WITH RIGHT RETROGRADE PYELOGRAM, RIGHT URETEROSCOPY, STONE EXTRACTION AND RIGHT DOUBLE J STENT PLACEMENT;  Surgeon: Ailene Rud, MD;  Location: [facility];  Service: Urology;  Laterality: Right;  . ESOPHAGEAL DILATION  X2  LAST ONE  JUNE 2014   has had 9 of these  . ESOPHAGEAL MANOMETRY N/A 09/10/2015   Procedure: ESOPHAGEAL MANOMETRY (EM);  Surgeon: Manus Gunning, MD;  [facility];  Service: Gastroenterology;  Laterality: N/A;  . HOLMIUM LASER APPLICATION Right 00/92/3300   Procedure: HOLMIUM LASER OF STONE ;  Surgeon: Ailene Rud, MD;  Location: [facility];  Service: Urology;  Laterality: Right;  . KNEE ARTHROSCOPY Bilateral 2008  . LAPAROSCOPIC CHOLECYSTECTOMY  02/10/2017  . MULTIPLE TOOTH EXTRACTIONS     past hx  . RECTOCELE REPAIR N/A 02/20/2014   Procedure: POSTERIOR REPAIR (RECTOCELE) WITH VAGINAL VAULT REPAIR WITH ACELL GRAFT PLACEMENT, PERINEOPLASTY, ENTEROCELE REPAIR;  Surgeon: Ailene Rud, MD;  Location[facility];  Service: Urology;  Laterality: N/A;  . TUBAL LIGATION  1987     reports that she has never smoked. She has never used smokeless tobacco. She reports that she does not drink alcohol or use drugs.  Allergies  Allergen Reactions  . Lisinopril Cough  . Doxycycline Diarrhea    Severe headaches  . Metronidazole Other (See Comments)    Severe headaches BUT CAN BE TOLERATED    Family History  Problem Relation Age of Onset  . Colon cancer Father 47       died at 62  . Heart disease Mother        died at 27  . Heart attack Mother   . Heart attack Sister        died at 66  . Hypertension Brother   . Heart attack Cousin 39       with death  . Heart attack Cousin 21       died with MI  . Esophageal cancer Neg Hx   . Rectal cancer Neg Hx   . Stomach cancer Neg Hx   . Colon polyps Neg Hx     Prior to Admission medications   Medication Sig Start Date End Date Taking? Authorizing Provider  amLODipine (NORVASC) 10 MG tablet Take 10 mg by mouth daily.    Yes [provider]  atorvastatin (LIPITOR) 10 MG tablet Take 10 mg by mouth at bedtime.  05/15/17  Yes [provider]  budesonide-formoterol (SYMBICORT) 160-4.5 MCG/ACT inhaler Inhale 2 puffs into the lungs 2 (two) times daily as needed (Shortness of breath).  07/13/18 07/13/19 Yes [provider]  chlorpheniramine  (CHLOR-TRIMETON) 4 MG tablet Take 1 tablet (4 mg total) by mouth at bedtime. Patient taking differently: Take 4-8 mg by mouth at bedtime. Will take 2 if coughing more 03/08/15  Yes Chesley Mires, MD  clonazePAM (KLONOPIN) 0.5 MG tablet Take 0.5 mg by mouth 2 (two) times daily as needed for anxiety.    Yes [provider]  furosemide (LASIX) 40 MG tablet Take 0.5 tablets (20 mg total) by mouth daily. Patient taking differently: Take 20 mg by mouth daily as needed (for swelling/fluid retention.).  05/28/17  Yes Domenic Polite, MD  losartan (COZAAR) 50 MG tablet Take 50 mg by mouth daily.   Yes [provider]  metoprolol succinate (TOPROL-XL) 50 MG 24 hr tablet Take 50 mg by mouth at bedtime.  08/08/15  Yes [provider]  omeprazole (PRILOSEC) 40 MG capsule TAKE 1 CAPSULE(40 MG) BY MOUTH TWICE DAILY Patient taking differently: Take 40 mg by mouth 2 (two) times daily.  09/17/16  Yes Armbruster, [name], MD  topiramate (TOPAMAX) 50 MG tablet Take 50 mg by mouth 2 (two) times daily.   Yes [provider]  traZODone (DESYREL) 50 MG tablet Take 50 mg by mouth at bedtime as needed for sleep.  05/20/18  Yes [provider]  fluconazole (DIFLUCAN) 200 MG tablet Take 2 tablets by mouth day one, take one tablet by mouth daily until completed. 08/05/18   Mansouraty, Telford Nab., MD  hyoscyamine (LEVBID) 0.375 MG 12 hr tablet Take 1 tablet (0.375 mg total) by mouth every 12 (twelve) hours as needed for cramping. Patient not taking: Reported on 07/29/2018 05/28/17   Domenic Polite, MD  OXYGEN Inhale 4 L into the lungs See admin instructions. With CPAP continuously  and During Waking Hours as needed    [provider]  polyethylene glycol (MIRALAX / GLYCOLAX) packet Take 17 g by mouth daily as needed for moderate constipation. Patient not taking: Reported on 08/09/2018 01/16/17   Bonnielee Haff, MD  UNABLE TO FIND Med Name: CPAP    [provider]     Physical Exam: Vitals:   08/09/18 0230 08/09/18 0517 08/09/18 0530 08/09/18 0600  BP: (!) 161/103 (!) 156/82 (!) 150/81 138/79  Pulse: 65 72 69 66  Resp: 15 15 19 14   Temp:  TempSrc:      SpO2: 98% 96% 97% 96%  Weight:      Height:          Constitutional: Moderately built and nourished. Vitals:   08/09/18 0230 08/09/18 0517 08/09/18 0530 08/09/18 0600  BP: (!) 161/103 (!) 156/82 (!) 150/81 138/79  Pulse: 65 72 69 66  Resp: 15 15 19 14   Temp:      TempSrc:      SpO2: 98% 96% 97% 96%  Weight:      Height:       Eyes: Anicteric no pallor. ENMT: No discharge from the ears eyes nose or mouth. Neck: No mass felt.  No neck rigidity. Respiratory: No rhonchi or crepitations. Cardiovascular: S1-S2 heard. Abdomen: Soft nontender bowel sounds present. Musculoskeletal: No edema.  No joint effusion. Skin: Diffuse maculopapular rash no involvement of the oral mucosa.  Slightly tender to touch on the back. Neurologic: Alert awake oriented to time place and person.  Moves all extremities.  Psychiatric: Appears normal normal affect.   Labs on Admission: I have personally reviewed following labs and imaging studies  CBC: Recent Labs  Lab 08/08/18 1927  WBC 6.1  NEUTROABS 4.1  HGB 12.0  HCT 38.2  MCV 106.1*  PLT 756   Basic Metabolic Panel: Recent Labs  Lab 08/09/18 0243  NA 138  K 5.2*  CL 110  CO2 24  GLUCOSE 182*  BUN 17  CREATININE 1.06*  CALCIUM 8.1*   GFR: Estimated Creatinine Clearance: 64.4 mL/min (A) (by C-G formula based on SCr of 1.06 mg/dL (H)). Liver Function Tests: Recent Labs  Lab 08/09/18 0243  AST 60*  ALT 34  ALKPHOS 60  BILITOT 0.7  PROT 6.8  ALBUMIN 3.2*   No results for input(s): LIPASE, AMYLASE in the last 168 hours. No results for input(s): AMMONIA in the last 168 hours. Coagulation Profile: No results for input(s): INR, PROTIME in the last 168 hours. Cardiac Enzymes: No results for input(s): CKTOTAL, CKMB, CKMBINDEX,  TROPONINI in the last 168 hours. BNP (last 3 results) No results for input(s): PROBNP in the last 8760 hours. HbA1C: No results for input(s): HGBA1C in the last 72 hours. CBG: No results for input(s): GLUCAP in the last 168 hours. Lipid Profile: No results for input(s): CHOL, HDL, LDLCALC, TRIG, CHOLHDL, LDLDIRECT in the last 72 hours. Thyroid Function Tests: No results for input(s): TSH, T4TOTAL, FREET4, T3FREE, THYROIDAB in the last 72 hours. Anemia Panel: No results for input(s): VITAMINB12, FOLATE, FERRITIN, TIBC, IRON, RETICCTPCT in the last 72 hours. Urine analysis:    Component Value Date/Time   COLORURINE YELLOW 08/21/2017 1446   APPEARANCEUR CLEAR 08/21/2017 1446   LABSPEC 1.013 08/21/2017 1446   PHURINE 6.0 08/21/2017 1446   GLUCOSEU NEGATIVE 08/21/2017 1446   HGBUR NEGATIVE 08/21/2017 1446   BILIRUBINUR NEGATIVE 08/21/2017 1446   KETONESUR NEGATIVE 08/21/2017 1446   PROTEINUR NEGATIVE 08/21/2017 1446   UROBILINOGEN 0.2 02/09/2015 1322   NITRITE NEGATIVE 08/21/2017 1446   LEUKOCYTESUR NEGATIVE 08/21/2017 1446   Sepsis Labs: @LABRCNTIP (procalcitonin:4,lacticidven:4) ) Recent Results (from the past 240 hour(s))  CSF culture     Status: None (Preliminary result)   Collection Time: 08/09/18  1:01 AM  Result Value Ref Range Status   Specimen Description CSF  Final   Special Requests Normal  Final   Gram Stain   Final    NO WBC SEEN NO ORGANISMS SEEN  RESULTS CALLED TO AND READ BACK TO N,[place] AT 0530 ON 08/09/18 BY A,MOHAMED  CYTOSPIN SMEAR Performed at [facility] [address] [place], [place] [address]    Culture PENDING  Incomplete   Report Status PENDING  Incomplete     Radiological Exams on Admission: Dg Chest 2 View  Result Date: 08/09/2018 CLINICAL DATA:  Petechiae, red rash over the body, itching, and headaches. Shortness of breath. EXAM: CHEST - 2 VIEW COMPARISON:  05/27/2018 FINDINGS: Cardiac enlargement. No vascular  congestion, edema, or consolidation. No blunting of costophrenic angles. No pneumothorax. Mediastinal contours appear intact. Degenerative changes in the spine. Surgical clips in the right upper quadrant. IMPRESSION: Cardiac enlargement. No evidence of active pulmonary disease. Electronically Signed   By: Lucienne Capers M.D.   On: 08/09/2018 00:31   Ct Head Wo Contrast  Result Date: 08/08/2018 CLINICAL DATA:  63 year old female with acute headache for 3 days. EXAM: CT HEAD WITHOUT CONTRAST TECHNIQUE: Contiguous axial images were obtained from the base of the skull through the vertex without intravenous contrast. COMPARISON:  06/25/2018 and prior CTs FINDINGS: Brain: No evidence of acute infarction, hemorrhage, hydrocephalus, extra-axial collection or mass lesion/mass effect. Vascular: No hyperdense vessel or unexpected calcification. Skull: Normal. Negative for fracture or focal lesion. Sinuses/Orbits: No acute finding. Other: None. IMPRESSION: No evidence of acute intracranial abnormality. Electronically Signed   By: Margarette Canada M.D.   On: 08/08/2018 21:09    EKG: Independently reviewed.  Normal sinus rhythm.  Assessment/Plan Principal Problem:   Rash Active Problems:   Essential hypertension   OSA (obstructive sleep apnea)   Morbid (severe) obesity due to excess calories (HCC)   Headache    1. Diffuse maculopapular rash with headache photophobia and confusion -patient is afebrile.  Lumbar puncture done labs are pending opening pressure was 26 mmHg.  Cultures have been sent.  No antibiotics have been started yet.  No involvement of oral mucosa.  The rash on the upper back is slightly tender to touch no blebs or vesicles seen.  Differentials include viral exanthem and should also consider possibility of Stevens-Johnson but no oral mucosal involvement.  Patient was given the choice of being transferred to [facility] other tertiary care center but patient would like to be observed for now in  this hospital.  Decadron was given along with Benadryl and Pepcid following which itching improved.  I have placed patient on Solu-Medrol PRN Benadryl and Pepcid.  May need to get infectious disease consult in the morning follow pending cultures and lumbar puncture labs and sed rate and CRP. 2. Hypertension we will continue metoprolol amlodipine with hold off Cozaar for now because of the rash and also mild hyperkalemia.  PRN IV hydralazine.  Follow metabolic panel. 3. Chronic systolic and diastolic CHF last EF measured in July 2018 was 45 to 50% with grade 2 diastolic dysfunction.  Presently receiving fluids.  Holding Lasix.  Closely monitor respiratory status. 4. Sleep apnea on CPAP at bedtime. 5. Esophageal stricture status post recent dilatation. 6. Morbid obesity. 7. History of migraine on Topamax. 8. Hyperglycemia likely from steroids.  May check hemoglobin A1c with next blood draw.   DVT prophylaxis: SCDs for now since patient had lumbar puncture. Code Status: Full code. Family Communication: Discussed with patient's daughter. Disposition Plan: Home. Consults called: None. Admission status: Observation.   Rise Patience MD [facility] Pager [phone].  If 7PM-7AM, please contact night-coverage [email or web address] Password [facility]  08/09/2018, 6:16 AM

## 2018-08-09 NOTE — Progress Notes (Addendum)
Patient was admitted earlier today, note, imaging, labs were reviewed -Patient presents with complaints of migraine, rash, over 24 hours, CT head with no acute findings, LP with opening pressure of 26, with no evidence of infectious etiology, possibly related to pseudotumor cerebri especially she presents with migraines, will be started on Diamox, encourage weight loss, will need to follow with ophthalmology and neurology as an outpatient,, as well patient reports some itching, which has improved with Benadryl and steroids, labs abnormalities, she will be monitored in observation, continue with Benadryl, steroids, and follow on septic work-up. -Discussed with daughter at bedside Phillips Climes MD

## 2018-08-09 NOTE — ED Notes (Signed)
Informed consent for Lumbar Puncture is signed and placed at bedside.

## 2018-08-09 NOTE — Progress Notes (Addendum)
Key Points: Use following P&T approved IV to PO non-antibiotic change policy.  Description contains the criteria that are approved Note: Policy Excludes:  Esophagectomy patientsPHARMACIST - PHYSICIAN COMMUNICATION DR:   [facility] CONCERNING: IV to Oral Route Change Policy  RECOMMENDATION: This patient is receiving pepcid by the intravenous route.  Based on criteria approved by the Pharmacy and Therapeutics Committee, the intravenous medication(s) is/are being converted to the equivalent oral dose form(s).   DESCRIPTION: These criteria include:  The patient is eating (either orally or via tube) and/or has been taking other orally administered medications for a least 24 hours  The patient has no evidence of active gastrointestinal bleeding or impaired GI absorption (gastrectomy, short bowel, patient on TNA or NPO).  If you have questions about this conversion, please contact the Pharmacy Department  []   [phone] )  [name] []   [phone] )  [facility]  []   [phone] )  [facility] [x]   [phone] )  [place], [place], [facility] 08/09/2018 12:45 PM

## 2018-08-09 NOTE — ED Notes (Signed)
Patient transported to X-ray 

## 2018-08-10 ENCOUNTER — Observation Stay (HOSPITAL_COMMUNITY): Payer: Medicaid Other

## 2018-08-10 DIAGNOSIS — Z87442 Personal history of urinary calculi: Secondary | ICD-10-CM | POA: Diagnosis not present

## 2018-08-10 DIAGNOSIS — I5042 Chronic combined systolic (congestive) and diastolic (congestive) heart failure: Secondary | ICD-10-CM | POA: Diagnosis present

## 2018-08-10 DIAGNOSIS — R51 Headache: Secondary | ICD-10-CM | POA: Diagnosis present

## 2018-08-10 DIAGNOSIS — I10 Essential (primary) hypertension: Secondary | ICD-10-CM | POA: Diagnosis not present

## 2018-08-10 DIAGNOSIS — R21 Rash and other nonspecific skin eruption: Secondary | ICD-10-CM | POA: Diagnosis present

## 2018-08-10 DIAGNOSIS — R402142 Coma scale, eyes open, spontaneous, at arrival to emergency department: Secondary | ICD-10-CM | POA: Diagnosis present

## 2018-08-10 DIAGNOSIS — I110 Hypertensive heart disease with heart failure: Secondary | ICD-10-CM | POA: Diagnosis present

## 2018-08-10 DIAGNOSIS — R402362 Coma scale, best motor response, obeys commands, at arrival to emergency department: Secondary | ICD-10-CM | POA: Diagnosis present

## 2018-08-10 DIAGNOSIS — Z881 Allergy status to other antibiotic agents status: Secondary | ICD-10-CM

## 2018-08-10 DIAGNOSIS — R1310 Dysphagia, unspecified: Secondary | ICD-10-CM | POA: Diagnosis not present

## 2018-08-10 DIAGNOSIS — L299 Pruritus, unspecified: Secondary | ICD-10-CM | POA: Diagnosis present

## 2018-08-10 DIAGNOSIS — Z888 Allergy status to other drugs, medicaments and biological substances status: Secondary | ICD-10-CM

## 2018-08-10 DIAGNOSIS — Z7951 Long term (current) use of inhaled steroids: Secondary | ICD-10-CM | POA: Diagnosis not present

## 2018-08-10 DIAGNOSIS — L298 Other pruritus: Secondary | ICD-10-CM

## 2018-08-10 DIAGNOSIS — L282 Other prurigo: Secondary | ICD-10-CM | POA: Diagnosis not present

## 2018-08-10 DIAGNOSIS — B3781 Candidal esophagitis: Secondary | ICD-10-CM

## 2018-08-10 DIAGNOSIS — Z800 Family history of malignant neoplasm of digestive organs: Secondary | ICD-10-CM | POA: Diagnosis not present

## 2018-08-10 DIAGNOSIS — M1990 Unspecified osteoarthritis, unspecified site: Secondary | ICD-10-CM | POA: Diagnosis present

## 2018-08-10 DIAGNOSIS — Z8673 Personal history of transient ischemic attack (TIA), and cerebral infarction without residual deficits: Secondary | ICD-10-CM | POA: Diagnosis not present

## 2018-08-10 DIAGNOSIS — Z9049 Acquired absence of other specified parts of digestive tract: Secondary | ICD-10-CM | POA: Diagnosis not present

## 2018-08-10 DIAGNOSIS — K5730 Diverticulosis of large intestine without perforation or abscess without bleeding: Secondary | ICD-10-CM | POA: Diagnosis present

## 2018-08-10 DIAGNOSIS — Z8249 Family history of ischemic heart disease and other diseases of the circulatory system: Secondary | ICD-10-CM | POA: Diagnosis not present

## 2018-08-10 DIAGNOSIS — N179 Acute kidney failure, unspecified: Secondary | ICD-10-CM | POA: Diagnosis present

## 2018-08-10 DIAGNOSIS — Z6841 Body Mass Index (BMI) 40.0 and over, adult: Secondary | ICD-10-CM | POA: Diagnosis not present

## 2018-08-10 DIAGNOSIS — R402252 Coma scale, best verbal response, oriented, at arrival to emergency department: Secondary | ICD-10-CM | POA: Diagnosis present

## 2018-08-10 DIAGNOSIS — E785 Hyperlipidemia, unspecified: Secondary | ICD-10-CM | POA: Diagnosis present

## 2018-08-10 DIAGNOSIS — K219 Gastro-esophageal reflux disease without esophagitis: Secondary | ICD-10-CM | POA: Diagnosis present

## 2018-08-10 DIAGNOSIS — G4733 Obstructive sleep apnea (adult) (pediatric): Secondary | ICD-10-CM | POA: Diagnosis present

## 2018-08-10 LAB — BASIC METABOLIC PANEL
ANION GAP: 8 (ref 5–15)
BUN: 20 mg/dL (ref 6–20)
CO2: 22 mmol/L (ref 22–32)
Calcium: 8.9 mg/dL (ref 8.9–10.3)
Chloride: 109 mmol/L (ref 98–111)
Creatinine, Ser: 1.14 mg/dL — ABNORMAL HIGH (ref 0.44–1.00)
GFR calc Af Amer: >60 mL/min (ref >60)
GFR calc non Af Amer: 52 mL/min — ABNORMAL LOW (ref >60)
Glucose, Bld: 135 mg/dL — ABNORMAL HIGH (ref 70–99)
Potassium: 4.1 mmol/L (ref 3.5–5.1)
Sodium: 139 mmol/L (ref 135–145)

## 2018-08-10 LAB — CBC
HCT: 44.9 % (ref 36.0–46.0)
HEMOGLOBIN: 13.9 g/dL (ref 12.0–15.0)
MCH: 32 pg (ref 26.0–34.0)
MCHC: 31 g/dL (ref 30.0–36.0)
MCV: 103.5 fL — ABNORMAL HIGH (ref 80.0–100.0)
Platelets: 315 10*3/uL (ref 150–400)
RBC: 4.34 MIL/uL (ref 3.87–5.11)
RDW: 13.8 % (ref 11.5–15.5)
WBC: 10.1 10*3/uL (ref 4.0–10.5)
nRBC: 0 % (ref 0.0–0.2)

## 2018-08-10 LAB — HEMOGLOBIN A1C
Hgb A1c MFr Bld: 5.9 % — ABNORMAL HIGH (ref 4.8–5.6)
Mean Plasma Glucose: 122.63 mg/dL

## 2018-08-10 LAB — VITAMIN B12: Vitamin B-12: 223 pg/mL (ref 180–914)

## 2018-08-10 LAB — FOLATE: FOLATE: 4.7 ng/mL — AB (ref >5.9)

## 2018-08-10 LAB — RPR: RPR Ser Ql: NONREACTIVE

## 2018-08-10 MED ORDER — DIPHENHYDRAMINE HCL 25 MG PO CAPS
25.0000 mg | ORAL_CAPSULE | Freq: Four times a day (QID) | ORAL | Status: DC | PRN
  Administered 2018-08-10 – 2018-08-12 (×6): 25 mg via ORAL
  Filled 2018-08-10 (×6): qty 1

## 2018-08-10 MED ORDER — GADOBUTROL 1 MMOL/ML IV SOLN
10.0000 mL | Freq: Once | INTRAVENOUS | Status: AC | PRN
  Administered 2018-08-10: 10 mL via INTRAVENOUS

## 2018-08-10 MED ORDER — LORAZEPAM 2 MG/ML IJ SOLN
1.0000 mg | Freq: Once | INTRAMUSCULAR | Status: AC
  Administered 2018-08-10: 1 mg via INTRAVENOUS
  Filled 2018-08-10: qty 1

## 2018-08-10 MED ORDER — HYDROXYZINE HCL 10 MG PO TABS
10.0000 mg | ORAL_TABLET | Freq: Three times a day (TID) | ORAL | Status: DC | PRN
  Administered 2018-08-10 – 2018-08-12 (×4): 10 mg via ORAL
  Filled 2018-08-10 (×6): qty 1

## 2018-08-10 NOTE — Consult Note (Signed)
[place] [facility]    Date of Admission:  08/08/2018          Reason for Consult: Pruritic rash    Referring Provider: Dr. Emeline Gins Elgergawy  Assessment: Given the appearance of the rash and oral lesion associated with intense pruritus I suspect that this is some type of hypersensitivity reaction.  I do not suspect infection.  Plan: 1. Recommend continuing antihistamines and prednisone for now  Principal Problem:   Pruritic rash Active Problems:   Headache   Essential hypertension   OSA (obstructive sleep apnea)   Morbid (severe) obesity due to excess calories (HCC)   Scheduled Meds: . acetaZOLAMIDE  500 mg Intravenous Q12H  . amLODipine  10 mg Oral Daily  . famotidine  20 mg Oral BID  . metoprolol succinate  50 mg Oral QHS  . pantoprazole  40 mg Oral Daily  . predniSONE  40 mg Oral Q breakfast  . topiramate  50 mg Oral BID   Continuous Infusions: PRN Meds:.acetaminophen **OR** acetaminophen, butalbital-acetaminophen-caffeine, clonazePAM, diphenhydrAMINE, hydrALAZINE, morphine injection  HPI: Ann [name] is a 63 y.o. female with multiple medical problems who developed, generalized itching 5 days ago.  Of red maculopapular rash developed several days later leading to her admission yesterday.  She also noted acute on chronic headache.  She was seen in the emergency department on 07/20/2017 with acute low back pain.  She was treated with methocarbamol and naproxen but tells me that she only took these for about 1 week and had been off of them well before her itching began.  She underwent EGD on 07/29/2017 for chronic dysphagia.  No esophageal lesions were noted but fungal elements were seen on biopsy.  She was prescribed fluconazole but never filled the prescription.  She denies being on any new medications shortly before her itching began.  Because of the headache she underwent lumbar puncture last night.  Her opening pressure was slightly elevated at 260  mm of water.  CSF analyses were normal.  She was treated with dexamethasone and famotidine in the ED.  She has been on famotidine, diphenhydramine and prednisone overnight but states that the itching and rash are worse today.  She has never had a pruritic rash like this before.   Review of Systems: Review of Systems  Constitutional: Negative for chills, diaphoresis, fever and weight loss.  Respiratory: Positive for cough.   Cardiovascular: Negative for chest pain.  Gastrointestinal: Negative for abdominal pain, diarrhea, nausea and vomiting.  Musculoskeletal: Positive for back pain.  Skin: Positive for itching and rash.  Neurological: Positive for sensory change and headaches.       She says that her skin hurts to touch all over.    Past Medical History:  Diagnosis Date  . Adenoma of colon   . Allergy    seasonal  . Anemia    with past pregnancy -not recent  . Anxiety   . Arthritis   . Borderline diabetes   . Chronic cystitis   . Complication of anesthesia    02-20-2014 ([facility]) INTRA-OP RESPIRATORY FAILURE SECONDARY TO POSSIBLE MUCOUS ASPIRATION/  ALSO 06-06-2013 [facility]) POST-OP DESATURATION  EVEN USING CPAP, States some issues if Propofol given to rapidly.  . Diverticulitis   . Diverticulosis of colon   . Dyspnea   . Family history of adverse reaction to anesthesia    PER PT SISTER DIED AFTER GENERAL ANESTHESIA DUE TO UNDIAGNOSED OSA  . GERD (gastroesophageal  reflux disease)   . H/O hiatal hernia   . Headache(784.0)    migraines, decreased since turning 50's  . History of chronic cough    "tight sounding cough"  . History of esophageal dilatation   . History of kidney stones   . History of MRSA infection    3/ 2015  AXILLARY ABSCESS  . History of recurrent UTIs   . History of TIAs NO RESIDUAL   1980;  2005;   2008 PT STATES PER CT  SCARRING RIGHT SIDE OF BRAIN  . Hyperlipidemia   . Hypertension   . Kidney disease    III  . Neuromuscular disorder ([place])    HH  .  Neuropathy    pt denies this 07-05-2015  . Nocturia   . OSA on CPAP    PER SLEEP STUDY 09-08-2012  MODERATE OSA. not used in awhile, but thinks needs to start back using due to some weight gain.  . Osteoarthritis of knee    Right  . Sleep apnea   . Stroke [facility]) [phone]   TIA'S  . SUI (stress urinary incontinence, female)     Social History   Tobacco Use  . Smoking status: Never Smoker  . Smokeless tobacco: Never Used  Substance Use Topics  . Alcohol use: No    Alcohol/week: 0.0 standard drinks  . Drug use: No    Family History  Problem Relation Age of Onset  . Colon cancer Father 1       died at 53  . Heart disease Mother        died at 2  . Heart attack Mother   . Heart attack Sister        died at 39  . Hypertension Brother   . Heart attack Cousin 8       with death  . Heart attack Cousin 26       died with MI  . Esophageal cancer Neg Hx   . Rectal cancer Neg Hx   . Stomach cancer Neg Hx   . Colon polyps Neg Hx    Allergies  Allergen Reactions  . Lisinopril Cough  . Doxycycline Diarrhea    Severe headaches  . Metronidazole Other (See Comments)    Severe headaches BUT CAN BE TOLERATED    OBJECTIVE: Blood pressure 112/84, pulse (!) 57, temperature 97.6 F (36.4 C), temperature source Oral, resp. rate 16, height 5\' 2"  (1.575 m), weight 105.7 kg, SpO2 96 %.  Physical Exam Constitutional:      Comments: She is alert and in no obvious distress.  She is sitting up in bed.  She is quite talkative.  HENT:     Mouth/Throat:     Comments: She has 1 erythematous lesion on her soft palate just to the right of midline. Eyes:     Conjunctiva/sclera: Conjunctivae normal.  Cardiovascular:     Rate and Rhythm: Normal rate and regular rhythm.     Heart sounds: No murmur.  Pulmonary:     Effort: Pulmonary effort is normal.     Breath sounds: Normal breath sounds.  Abdominal:     Palpations: Abdomen is soft.     Tenderness: There is no abdominal  tenderness.  Musculoskeletal:        General: No swelling or tenderness.  Skin:    Findings: Rash present.     Comments: She has a diffuse red maculopapular rash on her trunk, arms and legs.  There are a few lesions on  her palms.  Psychiatric:        Mood and Affect: Mood normal.     Lab Results Lab Results  Component Value Date   WBC 10.1 08/10/2018   HGB 13.9 08/10/2018   HCT 44.9 08/10/2018   MCV 103.5 (H) 08/10/2018   PLT 315 08/10/2018    Lab Results  Component Value Date   CREATININE 1.14 (H) 08/10/2018   BUN 20 08/10/2018   NA 139 08/10/2018   K 4.1 08/10/2018   CL 109 08/10/2018   CO2 22 08/10/2018    Lab Results  Component Value Date   ALT 31 08/09/2018   AST 40 08/09/2018   ALKPHOS 58 08/09/2018   BILITOT 0.2 (L) 08/09/2018     Microbiology: Recent Results (from the past 240 hour(s))  Culture, blood (Routine X 2) w Reflex to ID Panel     Status: None (Preliminary result)   Collection Time: 08/08/18  7:29 PM  Result Value Ref [place] Status   Specimen Description   Final    LEFT ANTECUBITAL Performed at [facility], [place] [address], [place], [place] [address]    Special Requests   Final    BOTTLES DRAWN AEROBIC AND ANAEROBIC Blood Culture adequate volume Performed at [place] [address] [place], [place] [address]    Culture   Final    NO GROWTH 1 DAY Performed at [facility], [place] [address], [place], [place] [address]    Report Status PENDING  Incomplete  Culture, blood (Routine X 2) w Reflex to ID Panel     Status: None (Preliminary result)   Collection Time: 08/08/18  7:34 PM  Result Value Ref [place] Status   Specimen Description   Final    RIGHT ANTECUBITAL Performed at [place] [address], [place], [place] [address]    Special Requests   Final    BOTTLES DRAWN AEROBIC AND ANAEROBIC Blood Culture adequate volume Performed at [place] [address], [place], [place] [address]    Culture   Final    NO GROWTH 1 DAY Performed at [facility], [place] [address], [place], [place] [address]    Report Status PENDING  Incomplete  CSF culture     Status: None (Preliminary result)   Collection Time: 08/09/18  1:01 AM  Result Value Ref [place] Status   Specimen Description   Final    CSF Performed at [place] [address], [place], [place] [address]    Special Requests   Final    Normal Performed at [facility], [place] [address], [place], [place] [address]    Gram Stain   Final    NO WBC SEEN NO ORGANISMS SEEN  RESULTS CALLED TO AND READ BACK TO N,WEDDERBUIN AT 0530 ON 08/09/18 BY A,MOHAMED CYTOSPIN SMEAR Performed at [facility], [place] [address] [place], [place] [address]    Culture   Final    NO GROWTH 1 DAY Performed at [facility], [place] [address], [place], [place] [address]    Report Status PENDING  Incomplete  Respiratory Panel by PCR     Status: None   Collection Time: 08/09/18  4:11 AM  Result Value Ref [place] Status   Adenovirus NOT DETECTED NOT DETECTED Final   Coronavirus 229E NOT DETECTED NOT DETECTED Final   Coronavirus HKU1 NOT DETECTED NOT DETECTED Final   Coronavirus NL63  NOT DETECTED NOT DETECTED Final   Coronavirus OC43 NOT DETECTED NOT DETECTED Final   Metapneumovirus NOT DETECTED NOT DETECTED Final   Rhinovirus / Enterovirus NOT DETECTED NOT DETECTED Final   Influenza A NOT DETECTED NOT DETECTED Final   Influenza B NOT DETECTED NOT DETECTED Final   Parainfluenza Virus 1 NOT DETECTED NOT DETECTED Final   Parainfluenza Virus 2 NOT DETECTED NOT DETECTED Final   Parainfluenza Virus 3 NOT DETECTED NOT DETECTED Final   Parainfluenza Virus 4 NOT DETECTED NOT DETECTED Final   Respiratory Syncytial Virus NOT DETECTED NOT DETECTED Final   Bordetella pertussis NOT DETECTED NOT DETECTED Final   Chlamydophila pneumoniae NOT DETECTED NOT  DETECTED Final   Mycoplasma pneumoniae NOT DETECTED NOT DETECTED Final    Comment: Performed at [facility], [place] [address], [place], [place] [address]    Michel Bickers, [place] [place] [facility] [phone] pager   [phone] cell 08/10/2018, 10:17 AM

## 2018-08-10 NOTE — Progress Notes (Signed)
PROGRESS NOTE                                                                                                                                                                                                             Patient Demographics:    Ann [name], is a 63 y.o. female, DOB - 11-13-1955, CXK:[id number]  Admit date - 08/08/2018   Admitting Physician Rise Patience, MD  Outpatient Primary MD for the patient is Berkley Harvey, NP  LOS - 0   Chief Complaint  Patient presents with  . Rash  . Headache       Brief Narrative    63 y.o. female with history of hypertension, sleep apnea, chronic systolic and diastolic CHF, esophageal stricture recent dilatation done 2 weeks ago presents to the ER after patient started noticing diffuse rash all over body, and headache, photophobia, LP significant for elevated opening pressure, otherwise CSF analysis nonsignificant, needed for further work-up.   Subjective:    Ann [name] she had a poor night sleep secondary to itching, still reports some headache    Assessment  & Plan :    Principal Problem:   Pruritic rash Active Problems:   Essential hypertension   OSA (obstructive sleep apnea)   Morbid (severe) obesity due to excess calories (HCC)   Headache  Rash -Admission, progressive, pruritic, accompanied by some oral lesion, ID input greatly appreciated, this is most likely due to hypersensitivity reaction, continue with prednisone, and antihistamines, continue to monitor clinically -CRP within normal level  Headache with elevated opening pressure on LP -Low suspicion for pseudotumor cerebri, putting pressure was 26, there was CSF work-up nonrevealing, she does report neck/back pain, I have started her on acetazolamide 200 mg twice daily, this can be uptitrated by neurology as an outpatient, have discussed at length with her, will need to follow with her primary neurologist and ophthalmologist as an  outpatient , encouraged weight loss , started on low-dose Lasix . 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  Follow metabolic panel.  Chronic systolic and diastolic  CHF  - last EF measured in July 2018 was 45 to 50% with grade 2 diastolic dysfunction.  Appears to be a euvolemic  Sleep apnea on CPAP at bedtime.  Esophageal stricture status post recent dilatation.  Morbid obesity.-Consult about weight loss  History of migraine on Topamax.  Hyperglycemia likely from steroids.    Check A1c  Code Status : Full  Family Communication  : Discussed with daughter at bedside yesterday  Disposition Plan  : home when stable  Barriers For Discharge : Need close follow-up for her rash  Consults  :  ID  Procedures  : None  DVT Prophylaxis  :  [place] heparin  Lab Results  Component Value Date   PLT 315 08/10/2018    Antibiotics  :    Anti-infectives (From admission, onward)   None        Objective:   Vitals:   08/09/18 0645 08/09/18 0747 08/09/18 2109 08/10/18 0608  BP: (!) 139/55 (!) 142/82 (!) 145/83 112/84  Pulse: 63 60 66 (!) 57  Resp: 18 20 16 16   Temp:  97.8 F (36.6 C) 98.2 F (36.8 C) 97.6 F (36.4 C)  TempSrc:  Oral Oral Oral  SpO2: 97% 96% 96% 96%  Weight:      Height:        Wt Readings from Last 3 Encounters:  08/08/18 105.7 kg  07/29/18 107.5 kg  07/28/18 107.5 kg    No intake or output data in the 24 hours ending 08/10/18 1222   Physical Exam  Awake Alert, Oriented X 3, No new F.N deficits, Normal affect She has 1 erythematous lesion on the upper soft palate in the right posterior area Symmetrical Chest wall movement, Good air movement bilaterally, CTAB RRR,No Gallops,Rubs or new Murmurs, No  Parasternal Heave +ve B.Sounds, Abd Soft, No tenderness,No rebound - guarding or rigidity. No Cyanosis, Clubbing or edema, she has diffuse maculopapular rash, on the trunk, extending to her arms and legs,     Data Review:    CBC Recent Labs  Lab 08/08/18 1927 08/09/18 0615 08/10/18 0627  WBC 6.1 5.2 10.1  HGB 12.0 12.6 13.9  HCT 38.2 40.3 44.9  PLT 205 245 315  MCV 106.1* 106.3* 103.5*  MCH 33.3 33.2 32.0  MCHC 31.4 31.3 31.0  RDW 14.4 13.7 13.8  LYMPHSABS 1.4 0.6*  --   MONOABS 0.4 0.0*  --   EOSABS 0.2 0.0  --   BASOSABS 0.0 0.0  --     Chemistries  Recent Labs  Lab 08/09/18 0243 08/09/18 0615 08/10/18 0627  NA 138 138 139  K 5.2* 4.6 4.1  CL 110 111 109  CO2 24 22 22   GLUCOSE 182* 218* 135*  BUN 17 16 20   CREATININE 1.06* 0.96 1.14*  CALCIUM 8.1* 8.1* 8.9  AST 60* 40  --   ALT 34 31  --   ALKPHOS 60 58  --   BILITOT 0.7 0.2*  --    ------------------------------------------------------------------------------------------------------------------ No results for input(s): CHOL, HDL, LDLCALC, TRIG, CHOLHDL, LDLDIRECT in the last 72 hours.  Lab Results  Component Value Date   HGBA1C 5.6 06/10/2017   ------------------------------------------------------------------------------------------------------------------ No results for input(s): TSH, T4TOTAL, T3FREE, THYROIDAB in the last 72 hours.  Invalid input(s): FREET3 ------------------------------------------------------------------------------------------------------------------ No results for input(s): VITAMINB12, FOLATE, FERRITIN, TIBC, IRON, RETICCTPCT in the last 72 hours.  Coagulation profile No results for input(s): INR, PROTIME in the last 168 hours.  No results for input(s): DDIMER in the last 72  hours.  Cardiac Enzymes No results for input(s): CKMB, TROPONINI, MYOGLOBIN in the last 168 hours.  Invalid input(s):  CK ------------------------------------------------------------------------------------------------------------------    Component Value Date/Time   BNP 17.5 06/03/2017 0550    Inpatient Medications  Scheduled Meds: . acetaZOLAMIDE  500 mg Intravenous Q12H  . amLODipine  10 mg Oral Daily  . famotidine  20 mg Oral BID  . metoprolol succinate  50 mg Oral QHS  . pantoprazole  40 mg Oral Daily  . predniSONE  40 mg Oral Q breakfast  . topiramate  50 mg Oral BID   Continuous Infusions: PRN Meds:.acetaminophen **OR** acetaminophen, butalbital-acetaminophen-caffeine, clonazePAM, diphenhydrAMINE, hydrALAZINE, morphine injection  Micro Results Recent Results (from the past 240 hour(s))  Culture, blood (Routine X 2) w Reflex to ID Panel     Status: None (Preliminary result)   Collection Time: 08/08/18  7:29 PM  Result Value Ref Range Status   Specimen Description   Final    LEFT ANTECUBITAL Performed at [facility] [address], [place], [place] [address]    Special Requests   Final    BOTTLES DRAWN AEROBIC AND ANAEROBIC Blood Culture adequate volume Performed at [facility] [address] [place], [place] [address]    Culture   Final    NO GROWTH 1 DAY Performed at [facility], [place] [address], [place], [place] [address]    Report Status PENDING  Incomplete  Culture, blood (Routine X 2) w Reflex to ID Panel     Status: None (Preliminary result)   Collection Time: 08/08/18  7:34 PM  Result Value Ref Range Status   Specimen Description   Final    RIGHT ANTECUBITAL Performed at [place] [address], [place], [place] [address]    Special Requests   Final    BOTTLES DRAWN AEROBIC AND ANAEROBIC Blood Culture adequate volume Performed at [place] [address], [place], [place] [address]    Culture   Final    NO GROWTH 1 DAY Performed at [facility], [place] [address],  [place], [place] [address]    Report Status PENDING  Incomplete  CSF culture     Status: None (Preliminary result)   Collection Time: 08/09/18  1:01 AM  Result Value Ref [place] Status   Specimen Description   Final    CSF Performed at [facility] [address], [place], [place] [address]    Special Requests   Final    Normal Performed at [facility], [place] [address], [place], [place] [address]    Gram Stain   Final    NO WBC SEEN NO ORGANISMS SEEN  RESULTS CALLED TO AND READ BACK TO N,WEDDERBUIN AT 0530 ON 08/09/18 BY A,MOHAMED CYTOSPIN SMEAR Performed at [facility], [place] [address] [place], [place] [address]    Culture   Final    NO GROWTH 1 DAY Performed at [facility], [place] [address] [place], [place] [address]    Report Status PENDING  Incomplete  Culture, fungus without smear     Status: None (Preliminary result)   Collection Time: 08/09/18  1:03 AM  Result Value Ref [place] Status   Specimen Description   Final    CSF Performed at [place] [address], [place], [place] [address]    Special Requests   Final    Normal Performed at [facility], [place] [address], [place], [place] [address]    Culture   Final    NO FUNGUS ISOLATED AFTER 1 DAY Performed at [facility], [place] [address], [place], [place] [address]    Report Status PENDING  Incomplete  Respiratory Panel by PCR     Status: None   Collection Time: 08/09/18  4:11 AM  Result Value Ref [place] Status   Adenovirus NOT DETECTED NOT DETECTED Final   Coronavirus 229E NOT DETECTED NOT DETECTED Final   Coronavirus HKU1 NOT DETECTED NOT DETECTED Final   Coronavirus NL63 NOT DETECTED NOT DETECTED Final   Coronavirus OC43 NOT DETECTED NOT DETECTED Final   Metapneumovirus NOT DETECTED NOT DETECTED Final   Rhinovirus / Enterovirus NOT DETECTED NOT DETECTED Final   Influenza A NOT DETECTED NOT DETECTED Final    Influenza B NOT DETECTED NOT DETECTED Final   Parainfluenza Virus 1 NOT DETECTED NOT DETECTED Final   Parainfluenza Virus 2 NOT DETECTED NOT DETECTED Final   Parainfluenza Virus 3 NOT DETECTED NOT DETECTED Final   Parainfluenza Virus 4 NOT DETECTED NOT DETECTED Final   Respiratory Syncytial Virus NOT DETECTED NOT DETECTED Final   Bordetella pertussis NOT DETECTED NOT DETECTED Final   Chlamydophila pneumoniae NOT DETECTED NOT DETECTED Final   Mycoplasma pneumoniae NOT DETECTED NOT DETECTED Final    Comment: Performed at [facility], [place] [address] [place], [place] [address]    Radiology Reports Dg Chest 2 View  Result Date: 08/09/2018 CLINICAL DATA:  Petechiae, red rash over the body, itching, and headaches. Shortness of breath. EXAM: CHEST - 2 VIEW COMPARISON:  05/27/2018 FINDINGS: Cardiac enlargement. No vascular congestion, edema, or consolidation. No blunting of costophrenic angles. No pneumothorax. Mediastinal contours appear intact. Degenerative changes in the spine. Surgical clips in the right upper quadrant. IMPRESSION: Cardiac enlargement. No evidence of active pulmonary disease. Electronically Signed   By: Lucienne Capers M.D.   On: 08/09/2018 00:31   Dg Lumbar Spine 2-3 Views  Result Date: 07/20/2018 CLINICAL DATA:  Back pain. EXAM: LUMBAR SPINE - 2-3 VIEW COMPARISON:  CT 08/06/2017.  KUB 07/29/2017. FINDINGS: Lumbar spine numbered with the lowest segmented appearing lumbar shaped vertebrae on lateral view as L5. Diffuse degenerative change lumbar spine with 3 mm anterolisthesis L4 on L5 again noted. No significant interim change identified. No evidence of fracture or dislocation. Surgical clips right upper quadrant. IMPRESSION: Diffuse degenerative change lumbar spine with 3 mm anterolisthesis L4 on L5. These changes are stable. No acute abnormality. Electronically Signed   By: Marcello Moores  Register   On: 07/20/2018 14:14   Ct Head Wo Contrast  Result Date: 08/08/2018 CLINICAL  DATA:  63 year old female with acute headache for 3 days. EXAM: CT HEAD WITHOUT CONTRAST TECHNIQUE: Contiguous axial images were obtained from the base of the skull through the vertex without intravenous contrast. COMPARISON:  06/25/2018 and prior CTs FINDINGS: Brain: No evidence of acute infarction, hemorrhage, hydrocephalus, extra-axial collection or mass lesion/mass effect. Vascular: No hyperdense vessel or unexpected calcification. Skull: Normal. Negative for fracture or focal lesion. Sinuses/Orbits: No acute finding. Other: None. IMPRESSION: No evidence of acute intracranial abnormality. Electronically Signed   By: Margarette Canada M.D.   On: 08/08/2018 21:09     Phillips Climes M.D on 08/10/2018 at 12:22 PM  Between 7am to 7pm - Pager - [phone]  After 7pm go to [email or web address] - password [facility]  [facility] -  Office  [phone]

## 2018-08-10 NOTE — Progress Notes (Signed)
Patient reports she had a fall a few weeks ago and has tenderness and bruising on her face and nose from hitting her face. She declines the use of nocturnal CPAP due to the bruising and states she has not been using her device at home for the same reasons over the recent few weeks. She does, however, states that she has been using 4L supplemental O2 at home, for which she continues nocturnally. VSS at this time. She states that she will not be using CPAP over the next several nights. She agrees to call for RT assistance to bring a machine once she feels comfortable with attempting mask placement, Order changed to prn per RT protocol for now.

## 2018-08-11 DIAGNOSIS — G4733 Obstructive sleep apnea (adult) (pediatric): Secondary | ICD-10-CM

## 2018-08-11 DIAGNOSIS — R21 Rash and other nonspecific skin eruption: Principal | ICD-10-CM

## 2018-08-11 DIAGNOSIS — N179 Acute kidney failure, unspecified: Secondary | ICD-10-CM

## 2018-08-11 LAB — CBC
HCT: 44.2 % (ref 36.0–46.0)
Hemoglobin: 13.7 g/dL (ref 12.0–15.0)
MCH: 33.4 pg (ref 26.0–34.0)
MCHC: 31 g/dL (ref 30.0–36.0)
MCV: 107.8 fL — ABNORMAL HIGH (ref 80.0–100.0)
Platelets: 303 10*3/uL (ref 150–400)
RBC: 4.1 MIL/uL (ref 3.87–5.11)
RDW: 14.1 % (ref 11.5–15.5)
WBC: 7.4 10*3/uL (ref 4.0–10.5)
nRBC: 0 % (ref 0.0–0.2)

## 2018-08-11 LAB — BASIC METABOLIC PANEL
Anion gap: 7 (ref 5–15)
BUN: 25 mg/dL — ABNORMAL HIGH (ref 6–20)
CO2: 23 mmol/L (ref 22–32)
Calcium: 8.6 mg/dL — ABNORMAL LOW (ref 8.9–10.3)
Chloride: 111 mmol/L (ref 98–111)
Creatinine, Ser: 1.19 mg/dL — ABNORMAL HIGH (ref 0.44–1.00)
GFR calc Af Amer: 57 mL/min — ABNORMAL LOW (ref >60)
GFR calc non Af Amer: 50 mL/min — ABNORMAL LOW (ref >60)
Glucose, Bld: 94 mg/dL (ref 70–99)
Potassium: 3.8 mmol/L (ref 3.5–5.1)
Sodium: 141 mmol/L (ref 135–145)

## 2018-08-11 MED ORDER — SODIUM CHLORIDE 0.9 % IV SOLN
INTRAVENOUS | Status: DC
  Administered 2018-08-11 (×2): via INTRAVENOUS

## 2018-08-11 MED ORDER — VITAMIN B-12 1000 MCG PO TABS
500.0000 ug | ORAL_TABLET | Freq: Every day | ORAL | Status: DC
  Administered 2018-08-11 – 2018-08-12 (×2): 500 ug via ORAL
  Filled 2018-08-11 (×2): qty 1

## 2018-08-11 MED ORDER — ACETAZOLAMIDE 250 MG PO TABS
500.0000 mg | ORAL_TABLET | Freq: Two times a day (BID) | ORAL | Status: DC
  Administered 2018-08-11 – 2018-08-12 (×2): 500 mg via ORAL
  Filled 2018-08-11 (×2): qty 2

## 2018-08-11 MED ORDER — FOLIC ACID 1 MG PO TABS
1.0000 mg | ORAL_TABLET | Freq: Every day | ORAL | Status: DC
  Administered 2018-08-11 – 2018-08-12 (×2): 1 mg via ORAL
  Filled 2018-08-11 (×2): qty 1

## 2018-08-11 MED ORDER — TOPIRAMATE 25 MG PO TABS
75.0000 mg | ORAL_TABLET | Freq: Two times a day (BID) | ORAL | Status: DC
  Administered 2018-08-11 – 2018-08-12 (×2): 75 mg via ORAL
  Filled 2018-08-11 (×2): qty 3

## 2018-08-11 NOTE — Progress Notes (Signed)
Patient ID: Ann [name], female   DOB: Dec 13, 1955, 63 y.o.   MRN: [id number]         [facility] for Infectious Disease  Date of Admission:  08/08/2018     ASSESSMENT: Although she does not feel better I think there has been definite improvement overnight.  Still suspect that she has some hypersensitivity reaction but the trigger is unknown.  I would continue a course of famotidine, diphenhydramine and a short prednisone taper.  PLAN: 1. Continue treatment for presumed hypersensitivity reaction 2. Recommend discharge home tomorrow (at her request)  Principal Problem:   Pruritic rash Active Problems:   Headache   Essential hypertension   OSA (obstructive sleep apnea)   Morbid (severe) obesity due to excess calories (HCC)   Rash   Scheduled Meds: . acetaZOLAMIDE  500 mg Oral BID  . amLODipine  10 mg Oral Daily  . famotidine  20 mg Oral BID  . folic acid  1 mg Oral Daily  . metoprolol succinate  50 mg Oral QHS  . pantoprazole  40 mg Oral Daily  . predniSONE  40 mg Oral Q breakfast  . topiramate  75 mg Oral BID  . vitamin B-12  500 mcg Oral Daily   Continuous Infusions: . sodium chloride 75 mL/hr at 08/11/18 1517   PRN Meds:.acetaminophen **OR** acetaminophen, butalbital-acetaminophen-caffeine, clonazePAM, diphenhydrAMINE, hydrALAZINE, hydrOXYzine, morphine injection   SUBJECTIVE: She says that her headache, rash and itching are no better overnight.  Review of Systems: Review of Systems  Constitutional: Negative for chills, diaphoresis and fever.  Skin: Positive for itching and rash.  Neurological: Positive for headaches.    Allergies  Allergen Reactions  . Lisinopril Cough  . Doxycycline Diarrhea    Severe headaches  . Metronidazole Other (See Comments)    Severe headaches BUT CAN BE TOLERATED    OBJECTIVE: Vitals:   08/10/18 0608 08/10/18 1920 08/10/18 2053 08/11/18 0533  BP: 112/84  (!) 115/58 129/79  Pulse: (!) 57 60 (!) 56 (!) 43  Resp: 16 20  (!) 21 19  Temp: 97.6 F (36.4 C)  98.4 F (36.9 C) 98 F (36.7 C)  TempSrc: Oral  Oral Oral  SpO2: 96%  96% 96%  Weight:      Height:       Body mass index is 42.62 kg/m.  Physical Exam Constitutional:      Comments: She is alert and in no distress sitting up in bed.  HENT:     Mouth/Throat:     Comments: Her right soft palate erythema is resolving. Eyes:     Conjunctiva/sclera: Conjunctivae normal.  Skin:    Comments: Her red, maculopapular rash is improved.  I do not see any definite new lesions and the old lesions are fading.  Psychiatric:        Mood and Affect: Mood normal.     Lab Results Lab Results  Component Value Date   WBC 7.4 08/11/2018   HGB 13.7 08/11/2018   HCT 44.2 08/11/2018   MCV 107.8 (H) 08/11/2018   PLT 303 08/11/2018    Lab Results  Component Value Date   CREATININE 1.19 (H) 08/11/2018   BUN 25 (H) 08/11/2018   NA 141 08/11/2018   K 3.8 08/11/2018   CL 111 08/11/2018   CO2 23 08/11/2018    Lab Results  Component Value Date   ALT 31 08/09/2018   AST 40 08/09/2018   ALKPHOS 58 08/09/2018   BILITOT 0.2 (L) 08/09/2018  Microbiology: Recent Results (from the past 240 hour(s))  Culture, blood (Routine X 2) w Reflex to ID Panel     Status: None (Preliminary result)   Collection Time: 08/08/18  7:29 PM  Result Value Ref Range Status   Specimen Description   Final    LEFT ANTECUBITAL Performed at [facility] [address] [place], [place] [address]    Special Requests   Final    BOTTLES DRAWN AEROBIC AND ANAEROBIC Blood Culture adequate volume Performed at [facility] [address] [place], [place] [address]    Culture   Final    NO GROWTH 2 DAYS Performed at [facility] [address], [place], [place] [address]    Report Status PENDING  Incomplete  Culture, blood (Routine X 2) w Reflex to ID Panel     Status: None (Preliminary result)   Collection Time: 08/08/18  7:34 PM    Result Value Ref Range Status   Specimen Description   Final    RIGHT ANTECUBITAL Performed at [facility] [address], [place], [place] [address]    Special Requests   Final    BOTTLES DRAWN AEROBIC AND ANAEROBIC Blood Culture adequate volume Performed at [facility] [address] [place], [place] [address]    Culture   Final    NO GROWTH 2 DAYS Performed at [facility] [address] [place], [place] [address]    Report Status PENDING  Incomplete  CSF culture     Status: None (Preliminary result)   Collection Time: 08/09/18  1:01 AM  Result Value Ref Range Status   Specimen Description   Final    CSF Performed at [facility] [address], [place], [place] [address]    Special Requests   Final    Normal Performed at [facility], [place] [address] [place], [place] [address]    Gram Stain   Final    NO WBC SEEN NO ORGANISMS SEEN  RESULTS CALLED TO AND READ BACK TO N,WEDDERBUIN AT 0530 ON 08/09/18 BY A,MOHAMED CYTOSPIN SMEAR Performed at [facility], [place] [address], [place], [place] [address]    Culture   Final    NO GROWTH 2 DAYS Performed at [facility] [address], [place], [place] [address]    Report Status PENDING  Incomplete  Culture, fungus without smear     Status: None (Preliminary result)   Collection Time: 08/09/18  1:03 AM  Result Value Ref Range Status   Specimen Description   Final    CSF Performed at [facility] [address], [place], [place] [address]    Special Requests   Final    Normal Performed at [facility], [place] [address], [place], [place] [address]    Culture   Final    NO FUNGUS ISOLATED AFTER 2 DAYS Performed at [facility], [place] [address], [place],  [address]    Report Status PENDING  Incomplete  Respiratory Panel by PCR     Status: None   Collection Time:  08/09/18  4:11 AM  Result Value Ref Range Status   Adenovirus NOT DETECTED NOT DETECTED Final   Coronavirus 229E NOT DETECTED NOT DETECTED Final   Coronavirus HKU1 NOT DETECTED NOT DETECTED Final   Coronavirus NL63 NOT DETECTED NOT DETECTED Final   Coronavirus OC43 NOT DETECTED NOT DETECTED  Final   Metapneumovirus NOT DETECTED NOT DETECTED Final   Rhinovirus / Enterovirus NOT DETECTED NOT DETECTED Final   Influenza A NOT DETECTED NOT DETECTED Final   Influenza B NOT DETECTED NOT DETECTED Final   Parainfluenza Virus 1 NOT DETECTED NOT DETECTED Final   Parainfluenza Virus 2 NOT DETECTED NOT DETECTED Final   Parainfluenza Virus 3 NOT DETECTED NOT DETECTED Final   Parainfluenza Virus 4 NOT DETECTED NOT DETECTED Final   Respiratory Syncytial Virus NOT DETECTED NOT DETECTED Final   Bordetella pertussis NOT DETECTED NOT DETECTED Final   Chlamydophila pneumoniae NOT DETECTED NOT DETECTED Final   Mycoplasma pneumoniae NOT DETECTED NOT DETECTED Final    Comment: Performed at [facility], [place] [address] [place], [place] [address]    Michel Bickers, [place] [phone] pager   [phone] cell 08/11/2018, 4:19 PM

## 2018-08-11 NOTE — Consult Note (Addendum)
Neurology Consultation Note  Consult Requested by: Dr. Maryland Pink  Reason for Consult: headache  Consult Date: 08/11/18  The history was obtained from the pt and daughter.  During history and examination, all items were able to obtain unless otherwise noted.  History of Present Illness:  Ann [name] is a 63 y.o. Caucasian female with PMH of migraine, HTN, OSA, esophageal stricture status post dilatation admitted for diffuse rash and diffuse headache.  She started to have rash and itchy all over the body 3 to 4 days ago, had ID consult, concerning for allergic reaction, currently on steroids and antihistamines.  Patient also complains of headache which has been going on for more than 6 months.  Patient stated that she saw Dr. Krista Blue in 01/2013 for headache at left temporal responding to Maxalt, normal exam, and MRI with and without contrast normal.  Considered migraine headache , put on nortriptyline 20 mg nightly.  However, patient cannot remember whether she was taking nortriptyline since then but stated that her headache from daily decreased to once a week and then eventually resolved for the last 5 years.  However, since more than 6 months ago, she started to have headache again, whole head hurts, changing locations from time to time, daily headache, 10/10, lying down seems worse than sitting up.  She went to see PCP, put on Topamax and continue on Maxalt.  This seems help a little bit but not very effective.  She also was stressed and depressed during the time as she had to take care of her husband who eventually passed away in 2018-05-17.  After that, patient had 2 surgeries, and she had not been referred to see neurologist again.  During the complaint of a headache, she had LP in the ER, essentially normal CSF, however opening pressure 26.  With detailed questioning, patient did not remember that her legs were extended during the opening pressure measurement.  She was continued on Topamax 50 mg twice  daily as home meds, started on Diamox 500 twice daily yesterday.  She did not feel headache much difference from yesterday.   Past Medical History:  Diagnosis Date  . Adenoma of colon   . Allergy    seasonal  . Anemia    with past pregnancy -not recent  . Anxiety   . Arthritis   . Borderline diabetes   . Chronic cystitis   . Complication of anesthesia    02-20-2014 ([facility]) INTRA-OP RESPIRATORY FAILURE SECONDARY TO POSSIBLE MUCOUS ASPIRATION/  ALSO 06-06-2013 [facility]) POST-OP DESATURATION  EVEN USING CPAP, States some issues if Propofol given to rapidly.  . Diverticulitis   . Diverticulosis of colon   . Dyspnea   . Family history of adverse reaction to anesthesia    PER PT SISTER DIED AFTER GENERAL ANESTHESIA DUE TO UNDIAGNOSED OSA  . GERD (gastroesophageal reflux disease)   . H/O hiatal hernia   . Headache(784.0)    migraines, decreased since turning 50's  . History of chronic cough    "tight sounding cough"  . History of esophageal dilatation   . History of kidney stones   . History of MRSA infection    3/ 2015  AXILLARY ABSCESS  . History of recurrent UTIs   . History of TIAs NO RESIDUAL   1980;  2005;   2008 PT STATES PER CT  SCARRING RIGHT SIDE OF BRAIN  . Hyperlipidemia   . Hypertension   . Kidney disease    III  . Neuromuscular disorder ([place])  HH  . Neuropathy    pt denies this 07-05-2015  . Nocturia   . OSA on CPAP    PER SLEEP STUDY 09-08-2012  MODERATE OSA. not used in awhile, but thinks needs to start back using due to some weight gain.  . Osteoarthritis of knee    Right  . Sleep apnea   . Stroke [facility]) [phone]   TIA'S  . SUI (stress urinary incontinence, female)     Past Surgical History:  Procedure Laterality Date  . ANTERIOR AND POSTERIOR REPAIR N/A 06/06/2013   Procedure: Anterior vaginal vault repair, Sacrospinous ligament fixation with UPHOLD lite, Kelly plication, Sacrospinous mesh fixation, Solyx transurethral sling;  Surgeon: Ailene Rud, MD;  Location: [facility];  Service: Urology;  Laterality: N/A;  . CHOLECYSTECTOMY N/A 02/10/2017   Procedure: LAPAROSCOPIC CHOLECYSTECTOMY;  Surgeon: Coralie Keens, MD;  Location: [facility];  Service: General;  Laterality: N/A;  . COLECTOMY WITH COLOSTOMY CREATION/HARTMANN PROCEDURE N/A 06/11/2017   Procedure: OPEN SIGMOID COLECTOMY;  Surgeon: [name], MD;  Location: [facility];  Service: General;  Laterality: N/A;  . CYSTOSCOPY W/ URETERAL STENT PLACEMENT Right 04/21/2014   Procedure: CYSTOSCOPY WITH RETROGRADE PYELOGRAM/URETERAL STENT PLACEMENT;  Surgeon: Ailene Rud, MD;  Location: [facility];  Service: Urology;  Laterality: Right;  . CYSTOSCOPY W/ URETERAL STENT PLACEMENT Right 11/21/2014   Procedure: CYSTOSCOPY WITH RETROGRADE PYELOGRAM, URETEROSCOPY WITH STONE REMOVAL, URETERAL STENT PLACEMENT;  Surgeon: Carolan Clines, MD;  Location: [facility];  Service: Urology;  Laterality: Right;  . CYSTOSCOPY W/ URETERAL STENT PLACEMENT Left 02/01/2016   Procedure: CYSTO, LEFT URETEROSCOPY, LEFT RETROGRADE AND LEFT URETERAL STENT PLACEMENT;  Surgeon: Carolan Clines, MD;  Location: [facility];  Service: Urology;  Laterality: Left;  . CYSTOSCOPY WITH RETROGRADE PYELOGRAM, URETEROSCOPY AND STENT PLACEMENT Right 06/05/2014   Procedure: CYSTOSCOPY WITH RIGHT RETROGRADE PYELOGRAM, RIGHT URETEROSCOPY, STONE EXTRACTION AND RIGHT DOUBLE J STENT PLACEMENT;  Surgeon: Ailene Rud, MD;  Location: [facility];  Service: Urology;  Laterality: Right;  . ESOPHAGEAL DILATION  X2  LAST ONE  JUNE 2014   has had 9 of these  . ESOPHAGEAL MANOMETRY N/A 09/10/2015   Procedure: ESOPHAGEAL MANOMETRY (EM);  Surgeon: Manus Gunning, MD;  [facility];  Service: Gastroenterology;  Laterality: N/A;  . HOLMIUM LASER APPLICATION Right 52/84/1324   Procedure: HOLMIUM LASER OF STONE ;  Surgeon: Ailene Rud, MD;  Location: [facility];   Service: Urology;  Laterality: Right;  . KNEE ARTHROSCOPY Bilateral 2008  . LAPAROSCOPIC CHOLECYSTECTOMY  02/10/2017  . MULTIPLE TOOTH EXTRACTIONS     past hx  . RECTOCELE REPAIR N/A 02/20/2014   Procedure: POSTERIOR REPAIR (RECTOCELE) WITH VAGINAL VAULT REPAIR WITH ACELL GRAFT PLACEMENT, PERINEOPLASTY, ENTEROCELE REPAIR;  Surgeon: Ailene Rud, MD;  Location: [facility];  Service: Urology;  Laterality: N/A;  . TUBAL LIGATION  1987    Family History  Problem Relation Age of Onset  . Colon cancer Father 9       died at 63  . Heart disease Mother        died at 60  . Heart attack Mother   . Heart attack Sister        died at 51  . Hypertension Brother   . Heart attack Cousin 39       with death  . Heart attack Cousin 15       died with MI  . Esophageal cancer Neg Hx   .  Rectal cancer Neg Hx   . Stomach cancer Neg Hx   . Colon polyps Neg Hx     Social History:  reports that she has never smoked. She has never used smokeless tobacco. She reports that she does not drink alcohol or use drugs.  Allergies:  Allergies  Allergen Reactions  . Lisinopril Cough  . Doxycycline Diarrhea    Severe headaches  . Metronidazole Other (See Comments)    Severe headaches BUT CAN BE TOLERATED    No current facility-administered medications on file prior to encounter.    Current Outpatient Medications on File Prior to Encounter  Medication Sig Dispense Refill  . amLODipine (NORVASC) 10 MG tablet Take 10 mg by mouth daily.     Marland Kitchen atorvastatin (LIPITOR) 10 MG tablet Take 10 mg by mouth at bedtime.   3  . budesonide-formoterol (SYMBICORT) 160-4.5 MCG/ACT inhaler Inhale 2 puffs into the lungs 2 (two) times daily as needed (Shortness of breath).     . chlorpheniramine (CHLOR-TRIMETON) 4 MG tablet Take 1 tablet (4 mg total) by mouth at bedtime. (Patient taking differently: Take 4-8 mg by mouth at bedtime. Will take 2 if coughing more) 14 tablet 0  . clonazePAM (KLONOPIN) 0.5 MG tablet Take 0.5  mg by mouth 2 (two) times daily as needed for anxiety.     . furosemide (LASIX) 40 MG tablet Take 0.5 tablets (20 mg total) by mouth daily. (Patient taking differently: Take 20 mg by mouth daily as needed (for swelling/fluid retention.). )    . losartan (COZAAR) 50 MG tablet Take 50 mg by mouth daily.    . metoprolol succinate (TOPROL-XL) 50 MG 24 hr tablet Take 50 mg by mouth at bedtime.   3  . omeprazole (PRILOSEC) 40 MG capsule TAKE 1 CAPSULE(40 MG) BY MOUTH TWICE DAILY (Patient taking differently: Take 40 mg by mouth 2 (two) times daily. ) 120 capsule 0  . topiramate (TOPAMAX) 50 MG tablet Take 50 mg by mouth 2 (two) times daily.    . traZODone (DESYREL) 50 MG tablet Take 50 mg by mouth at bedtime as needed for sleep.     . fluconazole (DIFLUCAN) 200 MG tablet Take 2 tablets by mouth day one, take one tablet by mouth daily until completed. 15 tablet 0  . hyoscyamine (LEVBID) 0.375 MG 12 hr tablet Take 1 tablet (0.375 mg total) by mouth every 12 (twelve) hours as needed for cramping. (Patient not taking: Reported on 07/29/2018) 60 tablet 0  . OXYGEN Inhale 4 L into the lungs See admin instructions. With CPAP continuously  and During Waking Hours as needed    . polyethylene glycol (MIRALAX / GLYCOLAX) packet Take 17 g by mouth daily as needed for moderate constipation. (Patient not taking: Reported on 08/09/2018) 14 each 0  . UNABLE TO FIND Med Name: CPAP      Review of Systems: A full ROS was attempted today and was able to be performed.  Systems assessed include - Constitutional, Eyes, HENT, Respiratory, Cardiovascular, Gastrointestinal, Genitourinary, Integument/breast, Hematologic/lymphatic, Musculoskeletal, Neurological, Behavioral/Psych, Endocrine, Allergic/Immunologic - with pertinent responses as per HPI.  Physical Examination: Temp:  [98 F (36.7 C)-98.4 F (36.9 C)] 98 F (36.7 C) (02/05 0533) Pulse Rate:  [43-60] 43 (02/05 0533) Resp:  [19-21] 19 (02/05 0533) BP: (115-129)/(58-79)  129/79 (02/05 0533) SpO2:  [96 %] 96 % (02/05 0533)  General - well nourished, well developed, in no apparent distress.    Ophthalmologic - fundi examination showed sharp disc bilaterally, no  papilledema.    Cardiovascular - regular rate and rhythm  Mental Status -  Level of arousal and orientation to time, place, and person were intact. Language including expression, naming, repetition, comprehension, reading, and writing was assessed and found intact. Fund of Knowledge was assessed and was intact.  Cranial Nerves II - XII - II - Vision intact OU. III, IV, VI - Extraocular movements intact. V - Facial sensation intact bilaterally. VII - Facial movement intact bilaterally. VIII - Hearing & vestibular intact bilaterally. X - Palate elevates symmetrically. XI - Chin turning & shoulder shrug intact bilaterally. XII - Tongue protrusion intact.  Motor Strength - The patient's strength was normal in all extremities and pronator drift was absent.   Motor Tone & Bulk - Muscle tone was assessed at the neck and appendages and was normal.  Bulk was normal and fasciculations were absent.   Reflexes - The patient's reflexes were normal in all extremities and she had no pathological reflexes.  Sensory - Light touch, temperature/pinprick were assessed and were normal.    Coordination - The patient had normal movements in the hands with no ataxia or dysmetria.  Tremor was absent.  Gait and Station - deferred  Data Reviewed: Ct Head Wo Contrast  Result Date: 08/08/2018 CLINICAL DATA:  63 year old female with acute headache for 3 days. EXAM: CT HEAD WITHOUT CONTRAST TECHNIQUE: Contiguous axial images were obtained from the base of the skull through the vertex without intravenous contrast. COMPARISON:  06/25/2018 and prior CTs FINDINGS: Brain: No evidence of acute infarction, hemorrhage, hydrocephalus, extra-axial collection or mass lesion/mass effect. Vascular: No hyperdense vessel or unexpected  calcification. Skull: Normal. Negative for fracture or focal lesion. Sinuses/Orbits: No acute finding. Other: None. IMPRESSION: No evidence of acute intracranial abnormality. Electronically Signed   By: Margarette Canada M.D.   On: 08/08/2018 21:09   Mr Jeri Cos CH Contrast  Result Date: 08/10/2018 CLINICAL DATA:  New onset headache yesterday. Status post lumbar puncture. EXAM: MRI HEAD WITHOUT AND WITH CONTRAST TECHNIQUE: Multiplanar, multiecho pulse sequences of the brain and surrounding structures were obtained without and with intravenous contrast. 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 No acute infarct, hemorrhage, or mass lesion is present. No significant white matter lesions are present. The ventricles are of normal size. No significant extraaxial fluid collection is present. The internal auditory canals are within normal limits. The brainstem and cerebellum are within normal limits. Postcontrast images demonstrate no pathologic enhancement. Vascular: Flow is present in the major intracranial arteries. Skull and upper cervical spine: The craniocervical junction is normal. Upper cervical spine is within normal limits. Marrow signal is unremarkable. Sinuses/Orbits: The paranasal sinuses and mastoid air cells are clear. The globes and orbits are within normal limits. IMPRESSION: Normal MRI the brain. No acute or focal abnormality to explain headaches. Electronically Signed   By: San Morelle M.D.   On: 08/10/2018 15:20    Assessment: 63 y.o. female with PMH of migraine, HTN, OSA, esophageal stricture status post dilatation admitted for diffuse rash and diffuse headache.  Her rash is currently treated with steroids and antihistamines. Pt was diagnosed with migraine headaches 6 years ago, but resolved for the last 5 years. However, since more than 6 months ago, she started to have headache again, daily  headache, 10/10, photophobia with blurry vision, on Topamax which helps some.  She also had a lot of stressed and depressed during this  time.  Patient has stopped CPAP treatment for OSA due to recent fall and facial injury.  Had LP in the ER, essentially normal CSF, however opening pressure 26, but seems falsely high as she still in fetal position when OP was measured. her neuro exam showed no papilledema. ESR and CRP normal. No evidence of cerebral venous thrombosis or temporal arteritis.  MRI with and without contrast normal.    Her headache still more consistent with migraine headache with stress and anxiety and depression.  Recommend to increase Topamax to 75 mg twice daily and Diamox 500 twice daily po. She will need to follow up with Dr. Krista Blue as outpt next available for continued migraine care.  Plan: - HA most likely chronic migraine headache exacerbated by stress, anxiety, and depression -Recommend to increase Topamax to 75 mg twice daily -Okay to continue Diamox 500 mg twice daily -continue CPAP for OSA treatment once recent facial injury resolves. -No further neuro follow-up needed -Outpatient follow-up with Dr. Krista Blue next available for continued migraine care  Neurology will sign off. Please call with questions. Pt will follow up with Dr. Krista Blue at [facility] next available. Thanks for the consult.  Rosalin Hawking, MD PhD Stroke Neurology 08/11/2018 1:09 PM

## 2018-08-11 NOTE — Plan of Care (Signed)
Plan of care reviewed and discussed with the patient. 

## 2018-08-11 NOTE — Progress Notes (Signed)
PROGRESS NOTE                                                                                                                                                                                                             Patient Demographics:    Ann [name], is a 63 y.o. female, DOB - 12-Jun-1956, FXT:[id number]  Admit date - 08/08/2018   Admitting Physician Rise Patience, MD  Outpatient Primary MD for the patient is Berkley Harvey, NP  LOS - 1   Chief Complaint  Patient presents with  . Rash  . Headache       Brief Narrative    63 y.o. female with history of hypertension, sleep apnea, chronic systolic and diastolic CHF, esophageal stricture recent dilatation done 2 weeks ago presents to the ER after patient started noticing diffuse rash all over body, and headache, photophobia, LP significant for elevated opening pressure, otherwise CSF analysis nonsignificant, needed for further work-up.   Subjective:   Patient is a poor historian.  Sometimes she is vague about her symptoms.  She also mentions blurred vision.  But she does say that her headaches have not improved any.  At times she tells me this is been ongoing is for a few days.  However her daughter corrected and says that the headache has been present for 6 months.  Reviewing her chart it appears that she has had headache for a very long period of time and has seen neurology previously for same.  Her rash has not improved any.   Assessment  & Plan :    Rash Appears to be a pruritic rash.  She has maculopapular rash involving her back trunk extremities.  This is mostly pruritic although there is some pain also present.  Could be a hypersensitivity reaction.  Could also be vasculitis, however ESR CRP normal.  For now she is on antihistamines and prednisone which is being continued. No mucosal involvement noted.  She is afebrile.    Headache with elevated opening pressure on LP Her pressure  was noted to be 26 when LP was done.  Apparently she may have been in the fetal position when this pressure was measured.  This is slightly elevated pressure.  Discussed with neurology this morning.  Patient continues to have headache with blurred vision.  MRI brain was done yesterday  which did not show any acute findings.  No evidence for infection on CSF analysis.  Patient seen by neurology.  They feel that her symptoms are most likely chronic migrainous headache.  Topamax increased to 75 mg twice a day.  Okay to continue Diamox for now.  No further neurological work-up recommended.  Outpatient follow-up with Dr. Krista Blue recommended.  Appreciate neurology input at this time.  He may also benefit from following up with ophthalmology.  Folate deficiency Folic acid level 4.7.  This will be supplemented.  B12 level 223.  This will also be supplemented.  Essential hypertension  Continue with amlodipine and metoprolol.  Cozaar on hold.  Blood pressure is reasonably well controlled.  Chronic systolic and diastolic CHF  Last EF measured in July 2018 was 45 to 50% with grade 2 diastolic dysfunction.  Appears to be a euvolemic  Sleep apnea On CPAP at bedtime.  Esophageal stricture  Status post recent dilatation.  Morbid obesity Body mass index is 42.62 kg/m.  History of migraine Dose of Topamax to be increased as discussed above.  Hyperglycemia Likely from steroids.  HbA1c 5.9.  Acute renal failure BUN and creatinine noted to be slightly higher today.  Avoid nephrotoxic agents.  IV fluids.  Monitor urine output.  Code Status : Full Family Communication  : Discussed with patient and daughter Disposition Plan  : home when stable Barriers For Discharge : Need close follow-up for her rash Consults  :  ID Procedures  : None  DVT Prophylaxis  :  [place] heparin  Antibiotics  : None    Objective:   Vitals:   08/10/18 0608 08/10/18 1920 08/10/18 2053 08/11/18 0533  BP: 112/84  (!) 115/58 129/79    Pulse: (!) 57 60 (!) 56 (!) 43  Resp: 16 20 (!) 21 19  Temp: 97.6 F (36.4 C)  98.4 F (36.9 C) 98 F (36.7 C)  TempSrc: Oral  Oral Oral  SpO2: 96%  96% 96%  Weight:      Height:        Wt Readings from Last 3 Encounters:  08/08/18 105.7 kg  07/29/18 107.5 kg  07/28/18 107.5 kg     Intake/Output Summary (Last 24 hours) at 08/11/2018 1358 Last data filed at 08/11/2018 0900 Gross per 24 hour  Intake 360 ml  Output -  Net 360 ml     Physical Exam  General appearance: Awake alert.  In no distress Resp: Clear to auscultation bilaterally.  Normal effort Cardio: S1-S2 is normal regular.  No S3-S4.  No rubs murmurs or bruit GI: Abdomen is soft.  Nontender nondistended.  Bowel sounds are present normal.  No masses organomegaly Extremities: No edema.  Full range of motion of lower extremities. Neurologic: Alert and oriented x3.  No focal neurological deficits.  Skin: Maculopapular rash noted over majority of her body.  No mucosal involvement.    Data Review:    CBC Recent Labs  Lab 08/08/18 1927 08/09/18 0615 08/10/18 0627 08/11/18 0636  WBC 6.1 5.2 10.1 7.4  HGB 12.0 12.6 13.9 13.7  HCT 38.2 40.3 44.9 44.2  PLT 205 245 315 303  MCV 106.1* 106.3* 103.5* 107.8*  MCH 33.3 33.2 32.0 33.4  MCHC 31.4 31.3 31.0 31.0  RDW 14.4 13.7 13.8 14.1  LYMPHSABS 1.4 0.6*  --   --   MONOABS 0.4 0.0*  --   --   EOSABS 0.2 0.0  --   --   BASOSABS 0.0 0.0  --   --  Chemistries  Recent Labs  Lab 08/09/18 0243 08/09/18 0615 08/10/18 0627 08/11/18 0636  NA 138 138 139 141  K 5.2* 4.6 4.1 3.8  CL 110 111 109 111  CO2 _0 GLUCOSE 182* 218* 135* 94  BUN _1 25*  CREATININE 1.06* 0.96 1.14* 1.19*  CALCIUM 8.1* 8.1* 8.9 8.6*  AST 60* 40  --   --   ALT 34 31  --   --   ALKPHOS 60 58  --   --   BILITOT 0.7 0.2*  --   --     Recent Labs    08/10/18 1259  VITAMINB12 223  FOLATE 4.7*     Inpatient Medications  Scheduled Meds: . acetaZOLAMIDE  500 mg  Oral BID  . amLODipine  10 mg Oral Daily  . famotidine  20 mg Oral BID  . metoprolol succinate  50 mg Oral QHS  . pantoprazole  40 mg Oral Daily  . predniSONE  40 mg Oral Q breakfast  . topiramate  75 mg Oral BID   Continuous Infusions: PRN Meds:.acetaminophen **OR** acetaminophen, butalbital-acetaminophen-caffeine, clonazePAM, diphenhydrAMINE, hydrALAZINE, hydrOXYzine, morphine injection  Micro Results Recent Results (from the past 240 hour(s))  Culture, blood (Routine X 2) w Reflex to ID Panel     Status: None (Preliminary result)   Collection Time: 08/08/18  7:29 PM  Result Value Ref Range Status   Specimen Description   Final    LEFT ANTECUBITAL Performed at [facility] [address] [place], [place] [address]    Special Requests   Final    BOTTLES DRAWN AEROBIC AND ANAEROBIC Blood Culture adequate volume Performed at [facility] [address] [place], [place] [address]    Culture   Final    NO GROWTH 1 DAY Performed at [facility], [place] [address], [place], [place] [address]    Report Status PENDING  Incomplete  Culture, blood (Routine X 2) w Reflex to ID Panel     Status: None (Preliminary result)   Collection Time: 08/08/18  7:34 PM  Result Value Ref Range Status   Specimen Description   Final    RIGHT ANTECUBITAL Performed at [facility] [address], [place], [place] [address]    Special Requests   Final    BOTTLES DRAWN AEROBIC AND ANAEROBIC Blood Culture adequate volume Performed at [facility] [address] [place], [place] [address]    Culture   Final    NO GROWTH 1 DAY Performed at [facility], [place] [address] [place], [place] [address]    Report Status PENDING  Incomplete  CSF culture     Status: None (Preliminary result)   Collection Time: 08/09/18  1:01 AM  Result Value Ref Range Status   Specimen Description   Final    CSF Performed at [facility] [address] [place], [place] [address]    Special Requests   Final    Normal Performed at [facility], [place] [address], [place], [place] [address]    Gram Stain   Final    NO WBC SEEN NO ORGANISMS SEEN  RESULTS CALLED TO AND READ BACK TO N,WEDDERBUIN AT 0530 ON 08/09/18 BY A,MOHAMED CYTOSPIN SMEAR Performed at [facility], [place] [address] [place], [place] [address]    Culture   Final    NO GROWTH 2 DAYS Performed at  [facility], [place] [address], [place], [place] [address]    Report Status PENDING  Incomplete  Culture, fungus without smear     Status: None (Preliminary result)   Collection Time: 08/09/18  1:03 AM  Result Value Ref Range Status   Specimen Description   Final    CSF Performed at [facility] [address] [place], [place] [address]    Special Requests   Final    Normal Performed at [facility], [place] [address], [place], [place] [address]    Culture   Final    NO FUNGUS ISOLATED AFTER 2 DAYS Performed at [facility], [place] [address], [place], [place] [address]    Report Status PENDING  Incomplete  Respiratory Panel by PCR     Status: None   Collection Time: 08/09/18  4:11 AM  Result Value Ref Range Status   Adenovirus NOT DETECTED NOT DETECTED Final   Coronavirus 229E NOT DETECTED NOT DETECTED Final   Coronavirus HKU1 NOT DETECTED NOT DETECTED Final   Coronavirus NL63 NOT DETECTED NOT DETECTED Final   Coronavirus OC43 NOT DETECTED NOT DETECTED Final   Metapneumovirus NOT DETECTED NOT DETECTED Final   Rhinovirus / Enterovirus NOT DETECTED NOT DETECTED Final   Influenza A NOT DETECTED NOT DETECTED Final   Influenza B NOT DETECTED NOT DETECTED Final   Parainfluenza Virus 1 NOT DETECTED NOT DETECTED Final   Parainfluenza Virus 2 NOT DETECTED NOT DETECTED Final   Parainfluenza Virus 3 NOT DETECTED NOT DETECTED Final   Parainfluenza Virus 4 NOT  DETECTED NOT DETECTED Final   Respiratory Syncytial Virus NOT DETECTED NOT DETECTED Final   Bordetella pertussis NOT DETECTED NOT DETECTED Final   Chlamydophila pneumoniae NOT DETECTED NOT DETECTED Final   Mycoplasma pneumoniae NOT DETECTED NOT DETECTED Final    Comment: Performed at [facility], [place] [address] [place], [place] [address]    Radiology Reports Dg Chest 2 View  Result Date: 08/09/2018 CLINICAL DATA:  Petechiae, red rash over the body, itching, and headaches. Shortness of breath. EXAM: CHEST - 2 VIEW COMPARISON:  05/27/2018 FINDINGS: Cardiac enlargement. No vascular congestion, edema, or consolidation. No blunting of costophrenic angles. No pneumothorax. Mediastinal contours appear intact. Degenerative changes in the spine. Surgical clips in the right upper quadrant. IMPRESSION: Cardiac enlargement. No evidence of active pulmonary disease. Electronically Signed   By: Lucienne Capers M.D.   On: 08/09/2018 00:31   Dg Lumbar Spine 2-3 Views  Result Date: 07/20/2018 CLINICAL DATA:  Back pain. EXAM: LUMBAR SPINE - 2-3 VIEW COMPARISON:  CT 08/06/2017.  KUB 07/29/2017. FINDINGS: Lumbar spine numbered with the lowest segmented appearing lumbar shaped vertebrae on lateral view as L5. Diffuse degenerative change lumbar spine with 3 mm anterolisthesis L4 on L5 again noted. No significant interim change identified. No evidence of fracture or dislocation. Surgical clips right upper quadrant. IMPRESSION: Diffuse degenerative change lumbar spine with 3 mm anterolisthesis L4 on L5. These changes are stable. No acute abnormality. Electronically Signed   By: Marcello Moores  Register   On: 07/20/2018 14:14   Ct Head Wo Contrast  Result Date: 08/08/2018 CLINICAL DATA:  63 year old female with acute headache for 3 days. EXAM: CT HEAD WITHOUT CONTRAST TECHNIQUE: Contiguous axial images were obtained from the base of the skull through the vertex without intravenous contrast. COMPARISON:  06/25/2018 and prior  CTs FINDINGS: Brain: No evidence of acute infarction, hemorrhage, hydrocephalus, extra-axial collection or mass lesion/mass effect. Vascular: No  hyperdense vessel or unexpected calcification. Skull: Normal. Negative for fracture or focal lesion. Sinuses/Orbits: No acute finding. Other: None. IMPRESSION: No evidence of acute intracranial abnormality. Electronically Signed   By: Margarette Canada M.D.   On: 08/08/2018 21:09   Mr Jeri Cos ZC Contrast  Result Date: 08/10/2018 CLINICAL DATA:  New onset headache yesterday. Status post lumbar puncture. EXAM: MRI HEAD WITHOUT AND WITH CONTRAST TECHNIQUE: Multiplanar, multiecho pulse sequences of the brain and surrounding structures were obtained without and with intravenous contrast. 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 No acute infarct, hemorrhage, or mass lesion is present. No significant white matter lesions are present. The ventricles are of normal size. No significant extraaxial fluid collection is present. The internal auditory canals are within normal limits. The brainstem and cerebellum are within normal limits. Postcontrast images demonstrate no pathologic enhancement. Vascular: Flow is present in the major intracranial arteries. Skull and upper cervical spine: The craniocervical junction is normal. Upper cervical spine is within normal limits. Marrow signal is unremarkable. Sinuses/Orbits: The paranasal sinuses and mastoid air cells are clear. The globes and orbits are within normal limits. IMPRESSION: Normal MRI the brain. No acute or focal abnormality to explain headaches. Electronically Signed   By: [place] Morelle M.D.   On: 08/10/2018 15:20     Bonnielee Haff M.D on 08/11/2018 at 1:58 PM  [facility] -  Office  [phone]

## 2018-08-12 DIAGNOSIS — R51 Headache: Secondary | ICD-10-CM

## 2018-08-12 DIAGNOSIS — L282 Other prurigo: Secondary | ICD-10-CM

## 2018-08-12 LAB — CBC
HCT: 40.7 % (ref 36.0–46.0)
Hemoglobin: 12.7 g/dL (ref 12.0–15.0)
MCH: 33.7 pg (ref 26.0–34.0)
MCHC: 31.2 g/dL (ref 30.0–36.0)
MCV: 108 fL — ABNORMAL HIGH (ref 80.0–100.0)
Platelets: 277 10*3/uL (ref 150–400)
RBC: 3.77 MIL/uL — AB (ref 3.87–5.11)
RDW: 14.1 % (ref 11.5–15.5)
WBC: 9 10*3/uL (ref 4.0–10.5)
nRBC: 0 % (ref 0.0–0.2)

## 2018-08-12 LAB — BASIC METABOLIC PANEL
Anion gap: 3 — ABNORMAL LOW (ref 5–15)
BUN: 18 mg/dL (ref 6–20)
CO2: 25 mmol/L (ref 22–32)
Calcium: 8.2 mg/dL — ABNORMAL LOW (ref 8.9–10.3)
Chloride: 112 mmol/L — ABNORMAL HIGH (ref 98–111)
Creatinine, Ser: 1.05 mg/dL — ABNORMAL HIGH (ref 0.44–1.00)
GFR calc Af Amer: >60 mL/min (ref >60)
GFR, EST NON AFRICAN AMERICAN: 58 mL/min — AB (ref >60)
Glucose, Bld: 106 mg/dL — ABNORMAL HIGH (ref 70–99)
POTASSIUM: 3.9 mmol/L (ref 3.5–5.1)
Sodium: 140 mmol/L (ref 135–145)

## 2018-08-12 LAB — CSF CULTURE W GRAM STAIN
Culture: NO GROWTH
Gram Stain: NONE SEEN
Special Requests: NORMAL

## 2018-08-12 MED ORDER — BUTALBITAL-APAP-CAFFEINE 50-325-40 MG PO TABS: 1.0000 | ORAL_TABLET | ORAL | 0 refills | Status: DC | PRN

## 2018-08-12 MED ORDER — HYDROCORTISONE 1 % EX CREA
TOPICAL_CREAM | Freq: Four times a day (QID) | CUTANEOUS | Status: DC
  Filled 2018-08-12: qty 28

## 2018-08-12 MED ORDER — PREDNISONE 20 MG PO TABS: ORAL_TABLET | ORAL | 0 refills | Status: DC

## 2018-08-12 MED ORDER — TOPIRAMATE 25 MG PO TABS: 75.0000 mg | ORAL_TABLET | Freq: Two times a day (BID) | ORAL | 0 refills | Status: DC

## 2018-08-12 MED ORDER — CYANOCOBALAMIN 500 MCG PO TABS: 500.0000 ug | ORAL_TABLET | Freq: Every day | ORAL | 2 refills | Status: DC

## 2018-08-12 MED ORDER — FAMOTIDINE 20 MG PO TABS: 20.0000 mg | ORAL_TABLET | Freq: Two times a day (BID) | ORAL | 0 refills | Status: DC

## 2018-08-12 MED ORDER — FOLIC ACID 1 MG PO TABS: 1.0000 mg | ORAL_TABLET | Freq: Every day | ORAL | 0 refills | Status: DC

## 2018-08-12 MED ORDER — ACETAZOLAMIDE 250 MG PO TABS: 500.0000 mg | ORAL_TABLET | Freq: Two times a day (BID) | ORAL | 0 refills | Status: DC

## 2018-08-12 MED ORDER — HYDROXYZINE HCL 10 MG PO TABS: 10.0000 mg | ORAL_TABLET | Freq: Three times a day (TID) | ORAL | 0 refills | Status: DC | PRN

## 2018-08-12 NOTE — Progress Notes (Signed)
Patient ID: Ann [name], female   DOB: 08-11-1955, 63 y.o.   MRN: [id number]         [facility]  Date of Admission:  08/08/2018     ASSESSMENT: She is improving slowly on therapy for hypersensitivity reaction.  I would continue a course of famotidine, diphenhydramine or hydroxyzine and a short prednisone taper.  PLAN: 1. Continue treatment for presumed hypersensitivity reaction 2. Recommend discharge home  3. I will sign off now  Principal Problem:   Pruritic rash Active Problems:   Headache   Essential hypertension   OSA (obstructive sleep apnea)   Morbid (severe) obesity due to excess calories (HCC)   Rash   Scheduled Meds: . acetaZOLAMIDE  500 mg Oral BID  . amLODipine  10 mg Oral Daily  . famotidine  20 mg Oral BID  . folic acid  1 mg Oral Daily  . metoprolol succinate  50 mg Oral QHS  . pantoprazole  40 mg Oral Daily  . predniSONE  40 mg Oral Q breakfast  . topiramate  75 mg Oral BID  . vitamin B-12  500 mcg Oral Daily   Continuous Infusions: . sodium chloride 75 mL/hr at 08/12/18 0600   PRN Meds:.acetaminophen **OR** acetaminophen, butalbital-acetaminophen-caffeine, clonazePAM, diphenhydrAMINE, hydrALAZINE, hydrOXYzine, morphine injection   SUBJECTIVE: She continues to experience headache and intense pruritus but does agree that her rash is resolving.  She is eager to go home.  Review of Systems: Review of Systems  Constitutional: Negative for chills, diaphoresis and fever.  Skin: Positive for itching and rash.  Neurological: Positive for headaches.    Allergies  Allergen Reactions  . Lisinopril Cough  . Doxycycline Diarrhea    Severe headaches  . Metronidazole Other (See Comments)    Severe headaches BUT CAN BE TOLERATED    OBJECTIVE: Vitals:   08/11/18 0533 08/11/18 1513 08/11/18 2109 08/12/18 0553  BP: 129/79 (!) 157/85 (!) 143/71 (!) 104/50  Pulse: (!) 43 (!) 58 62 (!) 52  Resp: 19 16 19 20   Temp: 98 F (36.7  C) 97.7 F (36.5 C) 98.4 F (36.9 C) 97.8 F (36.6 C)  TempSrc: Oral Oral Oral Oral  SpO2: 96% 97% 94% 95%  Weight:      Height:       Body mass index is 42.62 kg/m.  Physical Exam Constitutional:      Comments: She is alert and smiling, sitting up in bed.  HENT:     Mouth/Throat:     Pharynx: No oropharyngeal exudate or posterior oropharyngeal erythema.     Comments: Her right soft palate erythema is resolving. Eyes:     Conjunctiva/sclera: Conjunctivae normal.  Skin:    Comments: Her generalized rash continues to resolve.  Psychiatric:        Mood and Affect: Mood normal.     Lab Results Lab Results  Component Value Date   WBC 9.0 08/12/2018   HGB 12.7 08/12/2018   HCT 40.7 08/12/2018   MCV 108.0 (H) 08/12/2018   PLT 277 08/12/2018    Lab Results  Component Value Date   CREATININE 1.05 (H) 08/12/2018   BUN 18 08/12/2018   NA 140 08/12/2018   K 3.9 08/12/2018   CL 112 (H) 08/12/2018   CO2 25 08/12/2018    Lab Results  Component Value Date   ALT 31 08/09/2018   AST 40 08/09/2018   ALKPHOS 58 08/09/2018   BILITOT 0.2 (L) 08/09/2018     Microbiology:  Recent Results (from the past 240 hour(s))  Culture, blood (Routine X 2) w Reflex to ID Panel     Status: None (Preliminary result)   Collection Time: 08/08/18  7:29 PM  Result Value Ref Range Status   Specimen Description   Final    LEFT ANTECUBITAL Performed at [facility] [address], [place], [place] [address]    Special Requests   Final    BOTTLES DRAWN AEROBIC AND ANAEROBIC Blood Culture adequate volume Performed at [facility] [address] [place], [place] [address]    Culture   Final    NO GROWTH 3 DAYS Performed at [facility], [place] [address] [place], [place] [address]    Report Status PENDING  Incomplete  Culture, blood (Routine X 2) w Reflex to ID Panel     Status: None (Preliminary result)   Collection Time: 08/08/18  7:34 PM    Result Value Ref Range Status   Specimen Description   Final    RIGHT ANTECUBITAL Performed at [facility] [address] [place], [place] [address]    Special Requests   Final    BOTTLES DRAWN AEROBIC AND ANAEROBIC Blood Culture adequate volume Performed at [facility] [address], [place], [place] [address]    Culture   Final    NO GROWTH 3 DAYS Performed at [facility], [place] [address], [place], [place] [address]    Report Status PENDING  Incomplete  CSF culture     Status: None (Preliminary result)   Collection Time: 08/09/18  1:01 AM  Result Value Ref Range Status   Specimen Description   Final    CSF Performed at [facility] [address] [place], [place] [address]    Special Requests   Final    Normal Performed at [facility], [place] [address] [place], [place] [address]    Gram Stain   Final    NO WBC SEEN NO ORGANISMS SEEN  RESULTS CALLED TO AND READ BACK TO N,WEDDERBUIN AT 0530 ON 08/09/18 BY A,MOHAMED CYTOSPIN SMEAR Performed at [facility], [place] [address] [place], [place] [address]    Culture   Final    NO GROWTH 2 DAYS Performed at [facility] [address], [place], [place] [address]    Report Status PENDING  Incomplete  Culture, fungus without smear     Status: None (Preliminary result)   Collection Time: 08/09/18  1:03 AM  Result Value Ref Range Status   Specimen Description   Final    CSF Performed at [facility] [address] [place], [place] [address]    Special Requests   Final    Normal Performed at [facility], [place] [address], [place], [place] [address]    Culture   Final    NO FUNGUS ISOLATED AFTER 2 DAYS Performed at [facility], [place] [address], [place],  [address]    Report Status PENDING  Incomplete  Respiratory Panel by PCR     Status: None   Collection Time:  08/09/18  4:11 AM  Result Value Ref Range Status   Adenovirus NOT DETECTED NOT DETECTED Final   Coronavirus 229E NOT DETECTED NOT DETECTED Final   Coronavirus HKU1 NOT DETECTED NOT DETECTED Final   Coronavirus NL63 NOT DETECTED NOT DETECTED Final   Coronavirus OC43 NOT DETECTED NOT DETECTED Final  Metapneumovirus NOT DETECTED NOT DETECTED Final   Rhinovirus / Enterovirus NOT DETECTED NOT DETECTED Final   Influenza A NOT DETECTED NOT DETECTED Final   Influenza B NOT DETECTED NOT DETECTED Final   Parainfluenza Virus 1 NOT DETECTED NOT DETECTED Final   Parainfluenza Virus 2 NOT DETECTED NOT DETECTED Final   Parainfluenza Virus 3 NOT DETECTED NOT DETECTED Final   Parainfluenza Virus 4 NOT DETECTED NOT DETECTED Final   Respiratory Syncytial Virus NOT DETECTED NOT DETECTED Final   Bordetella pertussis NOT DETECTED NOT DETECTED Final   Chlamydophila pneumoniae NOT DETECTED NOT DETECTED Final   Mycoplasma pneumoniae NOT DETECTED NOT DETECTED Final    Comment: Performed at [facility], [place] [address], [place],  [address]    Michel Bickers, [facility] [phone] pager   [phone] cell 08/12/2018, 9:45 AM

## 2018-08-12 NOTE — Progress Notes (Signed)
Patient given discharge instructions, and verbalized an understanding of all discharge instructions.  Patient agrees with discharge plan, and is being discharged in stable medical condition.  Patient given transportation via wheelchair. 

## 2018-08-12 NOTE — Discharge Summary (Signed)
[facility]  Physician Discharge Summary   Patient ID: Ann [name] MRN: [id number] DOB/AGE: April 24, 1956 63 y.o.  Admit date: 08/08/2018 Discharge date: 08/12/2018  PCP: Berkley Harvey, NP  DISCHARGE DIAGNOSES:  Rash, unclear etiology, thought to be due to hypersensitivity Headaches thought to be migrainous Folate deficiency Essential hypertension Chronic systolic and diastolic CHF Sleep apnea Morbid obesity   RECOMMENDATIONS FOR OUTPATIENT FOLLOW UP: 1. Patient asked to follow-up with her primary care provider within 1 week. 2. If rash does not improve or gets worse she may need referral to dermatology. 3. Cozaar has been held for now.  May consider resuming at follow-up. 4. Recommend checking renal function at follow-up.    Home Health: None Equipment/Devices: None  CODE STATUS: Full code  DISCHARGE CONDITION: fair  Diet recommendation: As before  INITIAL HISTORY: 63 y.o.femalewithhistory of hypertension, sleep apnea, chronic systolic and diastolic CHF, esophageal stricture recent dilatation done 2 weeks ago presents to the ER after patient started noticing diffuse rash all over body, and headache, photophobia, LP significant for elevated opening pressure, otherwise CSF analysis nonsignificant, needed for further work-up.  Consultations:  Infectious disease  Neurology  Procedures:  None   HOSPITAL COURSE:    Rash possibly due to hypersensitivity Appears to be a pruritic rash.  She has maculopapular rash involving her back trunk extremities.  This is mostly pruritic although there is some pain also present.  Could be a hypersensitivity reaction.  Could also be vasculitis, however ESR CRP normal.    Patient was placed on antihistaminics as well as prednisone.  Rash appears to be improving slightly.  No mucosal involvement.  She is afebrile.  Still continues to have pruritus.  She will be discharged on tapering doses of steroids.  She has been asked  to follow-up with her PCP in the next few days.  Might need to be referred to dermatology if rash does not improve or gets worse.    Headache with elevated opening pressure on LP Her opening pressure was noted to be 26 when LP was done.  Apparently she may have been in the fetal position when this pressure was measured.  Neurology was consulted due to her persistent symptoms.  MRI brain was unremarkable.  Seen by Dr. Erlinda Hong yesterday who feels that her symptoms are more consistent with migraine rather than a new process.  Recommends increasing the dose of Topamax to 75 mg twice a day.  Also recommends continuing with her Diamox for now.  Patient has been referred to Dr. Krista Blue for further management.  She has seen Dr. Krista Blue previously.  She may also benefit from following up with ophthalmology.  Folate deficiency Folic acid level 4.7.  This will be supplemented.  B12 level 223.  This will also be supplemented.  Acute renal failure Mild rise in BUN and creatinine noted.  She was given IV fluids with improvement.    Essential hypertension  Cozaar on hold.  This can be addressed by PCP at follow-up.  This was mainly because of her elevated creatinine.  She may continue with amlodipine and metoprolol.    Chronic systolic and diastolic CHF  Last EF measured in July 2018 was 45 to 50% with grade 2 diastolic dysfunction.  Appears to be a euvolemic  Sleep apnea On CPAP at bedtime.  Esophageal stricture  Status post recent dilatation.  Morbid obesity Body mass index is 42.62 kg/m.  History of migraine Dose of Topamax to be increased as discussed above.  Hyperglycemia Likely from steroids.  HbA1c 5.9.  Overall stable.  Okay for discharge home today.     PERTINENT LABS:  The results of significant diagnostics from this hospitalization (including imaging, microbiology, ancillary and laboratory) are listed below for reference.    Microbiology: Recent Results (from the past 240 hour(s))   Culture, blood (Routine X 2) w Reflex to ID Panel     Status: None (Preliminary result)   Collection Time: 08/08/18  7:29 PM  Result Value Ref Range Status   Specimen Description   Final    LEFT ANTECUBITAL Performed at [facility] [address], [place], [place] [address]    Special Requests   Final    BOTTLES DRAWN AEROBIC AND ANAEROBIC Blood Culture adequate volume Performed at [facility] [address] [place], [place] [address]    Culture   Final    NO GROWTH 3 DAYS Performed at [facility], [place] [address], [place], [place] [address]    Report Status PENDING  Incomplete  Culture, blood (Routine X 2) w Reflex to ID Panel     Status: None (Preliminary result)   Collection Time: 08/08/18  7:34 PM  Result Value Ref Range Status   Specimen Description   Final    RIGHT ANTECUBITAL Performed at [facility] [address], [place], [place] [address]    Special Requests   Final    BOTTLES DRAWN AEROBIC AND ANAEROBIC Blood Culture adequate volume Performed at [facility] [address], [place], [place] [address]    Culture   Final    NO GROWTH 3 DAYS Performed at [facility], [place] [address], [place], [place] [address]    Report Status PENDING  Incomplete  CSF culture     Status: None   Collection Time: 08/09/18  1:01 AM  Result Value Ref Range Status   Specimen Description   Final    CSF Performed at [facility] [address] [place], [place] [address]    Special Requests   Final    Normal Performed at [facility], [place] [address], [place], [place] [address]    Gram Stain   Final    NO WBC SEEN NO ORGANISMS SEEN  RESULTS CALLED TO AND READ BACK TO N,WEDDERBUIN AT 0530 ON 08/09/18 BY A,MOHAMED CYTOSPIN SMEAR Performed at [facility], [place] [address] [place], [place] [address]    Culture   Final    NO GROWTH 3  DAYS Performed at [facility], [place] [address], [place], [place] [address]    Report Status 08/12/2018 FINAL  Final  Culture, fungus without smear     Status: None (Preliminary result)   Collection Time: 08/09/18  1:03 AM  Result Value Ref Range Status   Specimen Description   Final    CSF Performed at [facility] [address] [place], [place] [address]    Special Requests   Final    Normal Performed at [facility], [place] [address] [place], [place] [address]    Culture   Final    NO FUNGUS ISOLATED AFTER 3 DAYS Performed at [facility], [place] [address] [place], [place] [address]    Report Status PENDING  Incomplete  Respiratory Panel by PCR     Status: None   Collection Time: 08/09/18  4:11 AM  Result Value Ref Range Status  Adenovirus NOT DETECTED NOT DETECTED Final   Coronavirus 229E NOT DETECTED NOT DETECTED Final   Coronavirus HKU1 NOT DETECTED NOT DETECTED Final   Coronavirus NL63 NOT DETECTED NOT DETECTED Final   Coronavirus OC43 NOT DETECTED NOT DETECTED Final   Metapneumovirus NOT DETECTED NOT DETECTED Final   Rhinovirus / Enterovirus NOT DETECTED NOT DETECTED Final   Influenza A NOT DETECTED NOT DETECTED Final   Influenza B NOT DETECTED NOT DETECTED Final   Parainfluenza Virus 1 NOT DETECTED NOT DETECTED Final   Parainfluenza Virus 2 NOT DETECTED NOT DETECTED Final   Parainfluenza Virus 3 NOT DETECTED NOT DETECTED Final   Parainfluenza Virus 4 NOT DETECTED NOT DETECTED Final   Respiratory Syncytial Virus NOT DETECTED NOT DETECTED Final   Bordetella pertussis NOT DETECTED NOT DETECTED Final   Chlamydophila pneumoniae NOT DETECTED NOT DETECTED Final   Mycoplasma pneumoniae NOT DETECTED NOT DETECTED Final    Comment: Performed at [facility], [place] [address], [place], [place] [address]     Labs: Basic Metabolic Panel: Recent Labs  Lab 08/09/18 0243 08/09/18 0615 08/10/18 0627 08/11/18 0636  08/12/18 0604  NA 138 138 139 141 140  K 5.2* 4.6 4.1 3.8 3.9  CL 110 111 109 111 112*  CO2 _0 GLUCOSE 182* 218* 135* 94 106*  BUN _1 25* 18  CREATININE 1.06* 0.96 1.14* 1.19* 1.05*  CALCIUM 8.1* 8.1* 8.9 8.6* 8.2*   Liver Function Tests: Recent Labs  Lab 08/09/18 0243 08/09/18 0615  AST 60* 40  ALT 34 31  ALKPHOS 60 58  BILITOT 0.7 0.2*  PROT 6.8 6.5  ALBUMIN 3.2* 3.3*   CBC: Recent Labs  Lab 08/08/18 1927 08/09/18 0615 08/10/18 0627 08/11/18 0636 08/12/18 0604  WBC 6.1 5.2 10.1 7.4 9.0  NEUTROABS 4.1 4.6  --   --   --   HGB 12.0 12.6 13.9 13.7 12.7  HCT 38.2 40.3 44.9 44.2 40.7  MCV 106.1* 106.3* 103.5* 107.8* 108.0*  PLT 205 245 315 303 277    CBG: Recent Labs  Lab 08/09/18 1232  GLUCAP 148*     IMAGING STUDIES Dg Chest 2 View  Result Date: 08/09/2018 CLINICAL DATA:  Petechiae, red rash over the body, itching, and headaches. Shortness of breath. EXAM: CHEST - 2 VIEW COMPARISON:  05/27/2018 FINDINGS: Cardiac enlargement. No vascular congestion, edema, or consolidation. No blunting of costophrenic angles. No pneumothorax. Mediastinal contours appear intact. Degenerative changes in the spine. Surgical clips in the right upper quadrant. IMPRESSION: Cardiac enlargement. No evidence of active pulmonary disease. Electronically Signed   By: Lucienne Capers M.D.   On: 08/09/2018 00:31   Dg Lumbar Spine 2-3 Views  Result Date: 07/20/2018 CLINICAL DATA:  Back pain. EXAM: LUMBAR SPINE - 2-3 VIEW COMPARISON:  CT 08/06/2017.  KUB 07/29/2017. FINDINGS: Lumbar spine numbered with the lowest segmented appearing lumbar shaped vertebrae on lateral view as L5. Diffuse degenerative change lumbar spine with 3 mm anterolisthesis L4 on L5 again noted. No significant interim change identified. No evidence of fracture or dislocation. Surgical clips right upper quadrant. IMPRESSION: Diffuse degenerative change lumbar spine with 3 mm anterolisthesis L4 on L5. These  changes are stable. No acute abnormality. Electronically Signed   By: Marcello Moores  Register   On: 07/20/2018 14:14   Ct Head Wo Contrast  Result Date: 08/08/2018 CLINICAL DATA:  63 year old female with acute headache for 3 days. EXAM: CT HEAD WITHOUT CONTRAST TECHNIQUE: Contiguous axial images were obtained  from the base of the skull through the vertex without intravenous contrast. COMPARISON:  06/25/2018 and prior CTs FINDINGS: Brain: No evidence of acute infarction, hemorrhage, hydrocephalus, extra-axial collection or mass lesion/mass effect. Vascular: No hyperdense vessel or unexpected calcification. Skull: Normal. Negative for fracture or focal lesion. Sinuses/Orbits: No acute finding. Other: None. IMPRESSION: No evidence of acute intracranial abnormality. Electronically Signed   By: Margarette Canada M.D.   On: 08/08/2018 21:09   Mr Jeri Cos PX Contrast  Result Date: 08/10/2018 CLINICAL DATA:  New onset headache yesterday. Status post lumbar puncture. EXAM: MRI HEAD WITHOUT AND WITH CONTRAST TECHNIQUE: Multiplanar, multiecho pulse sequences of the brain and surrounding structures were obtained without and with intravenous contrast. 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 No acute infarct, hemorrhage, or mass lesion is present. No significant white matter lesions are present. The ventricles are of normal size. No significant extraaxial fluid collection is present. The internal auditory canals are within normal limits. The brainstem and cerebellum are within normal limits. Postcontrast images demonstrate no pathologic enhancement. Vascular: Flow is present in the major intracranial arteries. Skull and upper cervical spine: The craniocervical junction is normal. Upper cervical spine is within normal limits. Marrow signal is unremarkable. Sinuses/Orbits: The paranasal sinuses and mastoid air cells are clear. The globes and  orbits are within normal limits. IMPRESSION: Normal MRI the brain. No acute or focal abnormality to explain headaches. Electronically Signed   By: San Morelle M.D.   On: 08/10/2018 15:20    DISCHARGE EXAMINATION: Vitals:   08/11/18 0533 08/11/18 1513 08/11/18 2109 08/12/18 0553  BP: 129/79 (!) 157/85 (!) 143/71 (!) 104/50  Pulse: (!) 43 (!) 58 62 (!) 52  Resp: _0 Temp: 98 F (36.7 C) 97.7 F (36.5 C) 98.4 F (36.9 C) 97.8 F (36.6 C)  TempSrc: Oral Oral Oral Oral  SpO2: 96% 97% 94% 95%  Weight:      Height:       General appearance: Awake alert.  In no distress Resp: Clear to auscultation bilaterally.  Normal effort Cardio: S1-S2 is normal regular.  No S3-S4.  No rubs murmurs or bruit GI: Abdomen is soft.  Nontender nondistended.  Bowel sounds are present normal.  No masses organomegaly Extremities: No edema.  Full range of motion of lower extremities. Neurologic: Alert and oriented x3.  No focal neurological deficits.  Skin: Slight improvement in the maculopapular rash involving her trunks back and extremities.   DISPOSITION: Home  Discharge Instructions    Ambulatory referral to Neurology   Complete by:  As directed    An appointment is requested in approximately: 4 weeks   Call MD for:  difficulty breathing, headache or visual disturbances   Complete by:  As directed    Call MD for:  extreme fatigue   Complete by:  As directed    Call MD for:  persistant dizziness or light-headedness   Complete by:  As directed    Call MD for:  persistant nausea and vomiting   Complete by:  As directed    Call MD for:  severe uncontrolled pain   Complete by:  As directed    Call MD for:  temperature >100.4   Complete by:  As directed    Diet - low sodium heart healthy   Complete by:  As directed    Discharge instructions   Complete by:  As  directed    Please be sure to follow-up with your primary care provider within 1 week.  If your rash does not resolve or  gets worse you may need to be referred to a dermatologist.  Please hold your Cozaar for now until you have followed up with your PCP.  You were cared for by a hospitalist during your hospital stay. If you have any questions about your discharge medications or the care you received while you were in the hospital after you are discharged, you can call the unit and asked to speak with the hospitalist on call if the hospitalist that took care of you is not available. Once you are discharged, your primary care physician will handle any further medical issues. Please note that NO REFILLS for any discharge medications will be authorized once you are discharged, as it is imperative that you return to your primary care physician (or establish a relationship with a primary care physician if you do not have one) for your aftercare needs so that they can reassess your need for medications and monitor your lab values. If you do not have a primary care physician, you can call [phone] for a physician referral.   Increase activity slowly   Complete by:  As directed         Allergies as of 08/12/2018      Reactions   Lisinopril Cough   Doxycycline Diarrhea   Severe headaches   Metronidazole Other (See Comments)   Severe headaches BUT CAN BE TOLERATED      Medication List    STOP taking these medications   fluconazole 200 MG tablet Commonly known as:  DIFLUCAN   hyoscyamine 0.375 MG 12 hr tablet Commonly known as:  LEVBID   losartan 50 MG tablet Commonly known as:  COZAAR   polyethylene glycol packet Commonly known as:  MIRALAX / GLYCOLAX     TAKE these medications   acetaZOLAMIDE 250 MG tablet Commonly known as:  DIAMOX Take 2 tablets (500 mg total) by mouth 2 (two) times daily.   amLODipine 10 MG tablet Commonly known as:  NORVASC Take 10 mg by mouth daily.   atorvastatin 10 MG tablet Commonly known as:  LIPITOR Take 10 mg by mouth at bedtime.   butalbital-acetaminophen-caffeine  50-325-40 MG tablet Commonly known as:  FIORICET, ESGIC Take 1 tablet by mouth every 4 (four) hours as needed for headache.   chlorpheniramine 4 MG tablet Commonly known as:  CHLOR-TRIMETON Take 1 tablet (4 mg total) by mouth at bedtime. What changed:    how much to take  additional instructions   clonazePAM 0.5 MG tablet Commonly known as:  KLONOPIN Take 0.5 mg by mouth 2 (two) times daily as needed for anxiety.   famotidine 20 MG tablet Commonly known as:  PEPCID Take 1 tablet (20 mg total) by mouth 2 (two) times daily for 7 days.   folic acid 1 MG tablet Commonly known as:  FOLVITE Take 1 tablet (1 mg total) by mouth daily. Start taking on:  August 13, 2018   furosemide 40 MG tablet Commonly known as:  LASIX Take 0.5 tablets (20 mg total) by mouth daily. What changed:    when to take this  reasons to take this   hydrOXYzine 10 MG tablet Commonly known as:  ATARAX/VISTARIL Take 1 tablet (10 mg total) by mouth 3 (three) times daily as needed for itching.   metoprolol succinate 50 MG 24 hr tablet Commonly known as:  TOPROL-XL Take  50 mg by mouth at bedtime.   omeprazole 40 MG capsule Commonly known as:  PRILOSEC TAKE 1 CAPSULE(40 MG) BY MOUTH TWICE DAILY What changed:  See the new instructions.   OXYGEN Inhale 4 L into the lungs See admin instructions. With CPAP continuously  and During Waking Hours as needed   predniSONE 20 MG tablet Commonly known as:  DELTASONE Take 2 tablets once daily for 3 days, then take 1 tablet once daily for 4 days, then STOP.   SYMBICORT 160-4.5 MCG/ACT inhaler Generic drug:  budesonide-formoterol Inhale 2 puffs into the lungs 2 (two) times daily as needed (Shortness of breath).   topiramate 25 MG tablet Commonly known as:  TOPAMAX Take 3 tablets (75 mg total) by mouth 2 (two) times daily. What changed:    medication strength  how much to take   traZODone 50 MG tablet Commonly known as:  DESYREL Take 50 mg by mouth at  bedtime as needed for sleep.   UNABLE TO FIND Med Name: CPAP   vitamin B-12 500 MCG tablet Commonly known as:  CYANOCOBALAMIN Take 1 tablet (500 mcg total) by mouth daily. Start taking on:  August 13, 2018        Follow-up Information    Marcial Pacas, MD. Schedule an appointment as soon as possible for a visit in 4 week(s).   Specialty:  Neurology Contact information: [place] [address] [phone]        Berkley Harvey, NP. Schedule an appointment as soon as possible for a visit in 1 week(s).   Specialty:  Nurse Practitioner Why:  Ask them to consider referral to dermatology if your rash has not improved. Contact information: [address] [place] [address] [phone]           TOTAL DISCHARGE TIME: 69 minutes  [facility] Pager on [email or web address]  08/12/2018, 2:12 PM

## 2018-08-12 NOTE — Care Management Note (Signed)
Case Management Note  Patient Details  Name: Ann [name] MRN: [id number] Date of Birth: 1956/06/30  Subjective/Objective:                  discharged  Action/Plan: Discharged to home with self-care, orders checked for hhc needs. No CM needs present at time of discharge.  Patient is able to arrangement own appointments and home care.  Expected Discharge Date:  08/12/18               Expected Discharge Plan:  Home/Self Care  In-House Referral:     Discharge planning Services  CM Consult  Post Acute Care Choice:    Choice offered to:     DME Arranged:    DME Agency:     HH Arranged:    HH Agency:     Status of Service:  Completed, signed off  If discussed at [employer] of Stay Meetings, dates discussed:    Additional Comments:  Leeroy Cha, RN 08/12/2018, 2:02 PM

## 2018-08-14 LAB — CULTURE, BLOOD (ROUTINE X 2)
Culture: NO GROWTH
Culture: NO GROWTH
Special Requests: ADEQUATE
Special Requests: ADEQUATE

## 2018-08-16 LAB — FUNGUS CULTURE, BLOOD
Culture: NO GROWTH
Special Requests: ADEQUATE

## 2018-08-17 ENCOUNTER — Other Ambulatory Visit: Payer: Self-pay

## 2018-08-17 ENCOUNTER — Ambulatory Visit (AMBULATORY_SURGERY_CENTER): Payer: Self-pay | Admitting: *Deleted

## 2018-08-17 VITALS — Ht 62.0 in | Wt 240.8 lb

## 2018-08-17 DIAGNOSIS — R1319 Other dysphagia: Secondary | ICD-10-CM

## 2018-08-17 DIAGNOSIS — R1310 Dysphagia, unspecified: Secondary | ICD-10-CM

## 2018-08-17 NOTE — Progress Notes (Signed)
Patient denies any allergies to egg or soy products. Patient states anesthesia needs to go into vein slowly.  Patient denies oxygen use at home and denies diet medications. Emmi instructions for endoscopy explained and given to patient.  Patient uses CPAP most nights. Patient instructed to bring her inhaler on day of procedure.

## 2018-08-24 ENCOUNTER — Ambulatory Visit (AMBULATORY_SURGERY_CENTER): Payer: Medicaid Other | Admitting: Gastroenterology

## 2018-08-24 ENCOUNTER — Encounter: Payer: Self-pay | Admitting: Gastroenterology

## 2018-08-24 VITALS — BP 163/99 | HR 103 | Temp 97.3°F | Resp 14 | Ht 62.0 in | Wt 237.0 lb

## 2018-08-24 DIAGNOSIS — R1310 Dysphagia, unspecified: Secondary | ICD-10-CM

## 2018-08-24 DIAGNOSIS — K317 Polyp of stomach and duodenum: Secondary | ICD-10-CM | POA: Diagnosis not present

## 2018-08-24 DIAGNOSIS — K2970 Gastritis, unspecified, without bleeding: Secondary | ICD-10-CM

## 2018-08-24 DIAGNOSIS — K449 Diaphragmatic hernia without obstruction or gangrene: Secondary | ICD-10-CM

## 2018-08-24 DIAGNOSIS — K222 Esophageal obstruction: Secondary | ICD-10-CM

## 2018-08-24 DIAGNOSIS — R1319 Other dysphagia: Secondary | ICD-10-CM

## 2018-08-24 DIAGNOSIS — K208 Other esophagitis: Secondary | ICD-10-CM | POA: Diagnosis not present

## 2018-08-24 MED ORDER — SODIUM CHLORIDE 0.9 % IV SOLN: 500.0000 mL | Freq: Once | INTRAVENOUS | Status: DC

## 2018-08-24 NOTE — Progress Notes (Signed)
Report given to PACU, vss 

## 2018-08-24 NOTE — Progress Notes (Signed)
Called to room to assist during endoscopic procedure.  Patient ID and intended procedure confirmed with present staff. Received instructions for my participation in the procedure from the performing physician.  

## 2018-08-24 NOTE — Progress Notes (Signed)
Pt's states no medical or surgical changes since previsit or office visit. 

## 2018-08-24 NOTE — Patient Instructions (Signed)
Follow Dilation Diet. Please read handouts provided. Await pathology results. Continue present medications. Resume Plavix or other antiplatelet therapy tomorrow.        YOU HAD AN ENDOSCOPIC PROCEDURE TODAY AT [facility]:   Refer to the procedure report that was given to you for any specific questions about what was found during the examination.  If the procedure report does not answer your questions, please call your gastroenterologist to clarify.  If you requested that your care partner not be given the details of your procedure findings, then the procedure report has been included in a sealed envelope for you to review at your convenience later.  YOU SHOULD EXPECT: Some feelings of bloating in the abdomen. Passage of more gas than usual.  Walking can help get rid of the air that was put into your GI tract during the procedure and reduce the bloating. If you had a lower endoscopy (such as a colonoscopy or flexible sigmoidoscopy) you may notice spotting of blood in your stool or on the toilet paper. If you underwent a bowel prep for your procedure, you may not have a normal bowel movement for a few days.  Please Note:  You might notice some irritation and congestion in your nose or some drainage.  This is from the oxygen used during your procedure.  There is no need for concern and it should clear up in a day or so.  SYMPTOMS TO REPORT IMMEDIATELY:     Following upper endoscopy (EGD)  Vomiting of blood or coffee ground material  New chest pain or pain under the shoulder blades  Painful or persistently difficult swallowing  New shortness of breath  Fever of 100F or higher  Black, tarry-looking stools  For urgent or emergent issues, a gastroenterologist can be reached at any hour by calling [phone].   DIET:  Drink plenty of fluids but you should avoid alcoholic beverages for 24 hours.  ACTIVITY:  You should plan to take it easy for the rest of today and  you should NOT DRIVE or use heavy machinery until tomorrow (because of the sedation medicines used during the test).    FOLLOW UP: Our staff will call the number listed on your records the next business day following your procedure to check on you and address any questions or concerns that you may have regarding the information given to you following your procedure. If we do not reach you, we will leave a message.  However, if you are feeling well and you are not experiencing any problems, there is no need to return our call.  We will assume that you have returned to your regular daily activities without incident.  If any biopsies were taken you will be contacted by phone or by letter within the next 1-3 weeks.  Please call us at [phone] if you have not heard about the biopsies in 3 weeks.    SIGNATURES/CONFIDENTIALITY: You and/or your care partner have signed paperwork which will be entered into your electronic medical record.  These signatures attest to the fact that that the information above on your After Visit Summary has been reviewed and is understood.  Full responsibility of the confidentiality of this discharge information lies with you and/or your care-partner.

## 2018-08-24 NOTE — Op Note (Signed)
[facility] Patient Name: Ann [name] Procedure Date: 08/24/2018 10:00 AM MRN: [id number] Endoscopist: Remo Lipps P. Havery Moros , MD Age: 63 Referring MD:  Date of Birth: 05/30/1956 Gender: Female Account #: [id number] Procedure:                Upper GI endoscopy Indications:              Dysphagia - history of hiatal hernia and GEJ                            stricture, dilated to 67mm with Dr. Rush Landmark on                            1/23, dysphagia has recurred. Treated for                            esophageal candidiasis. Esophageal biopsies with no                            evidence of EoE. Of note patient has self stopped                            Plavix several weeks ago Medicines:                Monitored Anesthesia Care Procedure:                Pre-Anesthesia Assessment:                           - Prior to the procedure, a History and Physical                            was performed, and patient medications and                            allergies were reviewed. The patient's tolerance of                            previous anesthesia was also reviewed. The risks                            and benefits of the procedure and the sedation                            options and risks were discussed with the patient.                            All questions were answered, and informed consent                            was obtained. Prior Anticoagulants: The patient has                            taken no previous anticoagulant or antiplatelet  agents. ASA Grade Assessment: III - A patient with                            severe systemic disease. After reviewing the risks                            and benefits, the patient was deemed in                            satisfactory condition to undergo the procedure.                           After obtaining informed consent, the endoscope was                            passed under direct vision.  Throughout the                            procedure, the patient's blood pressure, pulse, and                            oxygen saturations were monitored continuously. The                            Endoscope was introduced through the mouth, and                            advanced to the second part of duodenum. The upper                            GI endoscopy was accomplished without difficulty.                            The patient tolerated the procedure well. Scope In: Scope Out: Findings:                 Esophagogastric landmarks were identified: the                            Z-line was found at 35 cm, the gastroesophageal                            junction was found at 35 cm and the upper extent of                            the gastric folds was found at 38 cm from the                            incisors.                           A 3 cm hiatal hernia was present.  One benign-appearing, intrinsic moderate stenosis                            was found 35 cm from the incisors at the GEJ                            entering the hernia sac. This stenosis measured                            less than one cm (in length). A TTS dilator was                            passed through the scope. Dilation with a 16-17-18                            mm balloon dilator was performed to 16 mm, 17 mm                            and 18 mm after which 2 mucosal wrents were noted.                            Biopsies were taken from each quadrant of the                            stricture to open it further with a cold forceps                            and sent for histology.                           The exam of the esophagus was otherwise normal.                           Multiple diminutive sessile polyps were found in                            the gastric body.                           The exam of the stomach was otherwise normal.                           The  duodenal bulb and second portion of the                            duodenum were normal. Complications:            No immediate complications. Estimated blood loss:                            Minimal. Estimated Blood Loss:     Estimated blood loss was minimal. Impression:               - Esophagogastric landmarks identified.                           -  3 cm hiatal hernia.                           - Benign-appearing esophageal stenosis. Dilated to                            43mm with good result and biopsied to open further.                           - Multiple benign appearing gastric polyps.                           - Normal duodenal bulb and second portion of the                            duodenum. Recommendation:           - Patient has a contact number available for                            emergencies. The signs and symptoms of potential                            delayed complications were discussed with the                            patient. Return to normal activities tomorrow.                            Written discharge instructions were provided to the                            patient.                           - Post dilation diet                           - Continue present medications.                           - Okay to resume Plavix or other antiplatelet                            therapy tomorrow                           - Await pathology results and course post dilation Sumner Boesch P. Manhattan Mccuen, MD 08/24/2018 10:38:05 AM This report has been signed electronically.

## 2018-08-25 ENCOUNTER — Telehealth: Payer: Self-pay

## 2018-08-25 NOTE — Telephone Encounter (Signed)
  Follow up Call-  Call back number 08/24/2018 07/29/2018 08/26/2016  Post procedure Call Back phone  # [phone]  Permission to leave phone message Yes Yes Yes  Some recent data might be hidden     Patient questions:  Do you have a fever, pain , or abdominal swelling? No. Pain Score  0 *  Have you tolerated food without any problems? No. Nausea and vomiting last evening, tolerating food this morning.  Have you been able to return to your normal activities? Yes.   Yes  Do you have any questions about your discharge instructions: Diet   No. Medications  No. Follow up visit  No.  Do you have questions or concerns about your Care? No.  Actions: * If pain score is 4 or above: No action needed, pain <4.

## 2018-08-30 ENCOUNTER — Encounter

## 2018-08-30 ENCOUNTER — Encounter: Payer: Medicaid Other | Admitting: Gastroenterology

## 2018-08-30 LAB — CULTURE, FUNGUS WITHOUT SMEAR: Special Requests: NORMAL

## 2018-09-05 HISTORY — PX: FRACTURE SURGERY: SHX138

## 2018-09-29 ENCOUNTER — Telehealth: Payer: Self-pay | Admitting: Neurology

## 2018-09-29 NOTE — Telephone Encounter (Signed)
Pt called re: her upcoming appointment.  Pt was given option of either virtual or telephone visit.  Pt stated she would not give consent for either of the 2 and asked that she appointment just be cancelled.  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

## 2018-10-04 ENCOUNTER — Ambulatory Visit: Payer: Medicaid Other | Admitting: Neurology

## 2018-10-04 ENCOUNTER — Encounter

## 2018-12-21 ENCOUNTER — Ambulatory Visit: Payer: Medicaid Other | Admitting: Neurology

## 2019-01-06 ENCOUNTER — Ambulatory Visit: Payer: Medicaid Other | Admitting: Neurology

## 2019-01-06 ENCOUNTER — Other Ambulatory Visit: Payer: Self-pay

## 2019-01-06 ENCOUNTER — Encounter: Payer: Self-pay | Admitting: Neurology

## 2019-01-06 VITALS — BP 151/94 | HR 68 | Temp 96.6°F | Ht 62.0 in | Wt 233.5 lb

## 2019-01-06 DIAGNOSIS — R51 Headache: Secondary | ICD-10-CM | POA: Diagnosis not present

## 2019-01-06 DIAGNOSIS — R519 Headache, unspecified: Secondary | ICD-10-CM

## 2019-01-06 MED ORDER — CAMBIA 50 MG PO PACK: 50.0000 mg | PACK | ORAL | 11 refills | Status: DC | PRN

## 2019-01-06 MED ORDER — TOPIRAMATE 100 MG PO TABS: 100.0000 mg | ORAL_TABLET | Freq: Two times a day (BID) | ORAL | 11 refills | Status: AC

## 2019-01-06 MED ORDER — AIMOVIG 70 MG/ML ~~LOC~~ SOAJ: 70.0000 mg | SUBCUTANEOUS | 11 refills | Status: DC

## 2019-01-06 NOTE — Progress Notes (Signed)
PATIENT: BRICELYN FREESTONE DOB: 16-Feb-1956  Chief Complaint  Patient presents with  . Headache    She is currently on Topamax 58m BID.  Recent normal brain MRI.  Last seen in our clinic 01/25/2013.  Her headaches are unpredictable but occur often.  She never goes more than one week without having at least one headache day.  .Marland KitchenPCP    JBerkley Harvey NP (referred from hospital)     HISTORICAL  KADYA WIRZis a 63year old female, seen in request by her primary care nurse practitioner JEldridge Abrahamsfor evaluation of headaches, initial evaluation was on January 06, 2019  I have reviewed and summarized the referring note from the referring physician.  She had a past medical history of hypertension, hyperlipidemia, gait abnormality due to significant bilateral knee pain  I reviewed hospital admission in February 2020, when she was admitted for headache, rash, she was also under a lot of stress, her husband of 40 years passed away in O2019/11/25  I personally reviewed work-up, MRI of the brain with without contrast on August 10, 2018 was normal  Laboratory evaluations in February 2020: BMP showed creatinine of 1.05, CBC showed hemoglobin of 12.7, AL8Xwas 5.9, folic acid was decreased 4.7, low normal B12 223, RPR, C-reactive protein, HIV, ESR  She also had lumbar puncture, opening pressure was 26 cmH2O, while she was in a fetus position, spinal fluid testing was normal  She reported a history of migraine headaches since she was young, much improved after age 63 but began to have recurrent headaches again since age 63 and this time is a bit different, it is all over behind both eyes, pressure, sometimes severe pounding headaches with light noise sensitivity, lasting hours to days, sometimes she has mild confusion, reported questionable TIA history in the past, she tried Maxalt with limited help,  Now she is taking Tylenol as needed, ice pack, which provides some help  She has been taking  Topamax last few years, not sure about the benefit  REVIEW OF SYSTEMS: Full 14 system review of systems performed and notable only for as above All other review of systems were negative.  ALLERGIES: Allergies  Allergen Reactions  . Lisinopril Cough  . Doxycycline Diarrhea    Severe headaches  . Metronidazole Other (See Comments)    Severe headaches BUT CAN BE TOLERATED    HOME MEDICATIONS: Current Outpatient Medications  Medication Sig Dispense Refill  . amLODipine (NORVASC) 10 MG tablet Take 10 mg by mouth daily.     .Marland Kitchenatorvastatin (LIPITOR) 10 MG tablet Take 10 mg by mouth at bedtime.   3  . budesonide-formoterol (SYMBICORT) 160-4.5 MCG/ACT inhaler Inhale 2 puffs into the lungs 2 (two) times daily as needed (Shortness of breath).     . chlorpheniramine (CHLOR-TRIMETON) 4 MG tablet Take 1 tablet (4 mg total) by mouth at bedtime. (Patient taking differently: Take 4-8 mg by mouth at bedtime. Will take 2 if coughing more) 14 tablet 0  . clonazePAM (KLONOPIN) 0.5 MG tablet Take 0.5 mg by mouth 2 (two) times daily as needed for anxiety.     . hydrochlorothiazide (HYDRODIURIL) 25 MG tablet Take 25 mg by mouth daily.    . metoprolol succinate (TOPROL-XL) 50 MG 24 hr tablet Take 50 mg by mouth at bedtime.   3  . omeprazole (PRILOSEC) 40 MG capsule TAKE 1 CAPSULE(40 MG) BY MOUTH TWICE DAILY (Patient taking differently: Take 40 mg by mouth 2 (two) times daily. )  120 capsule 0  . topiramate (TOPAMAX) 25 MG tablet Take 3 tablets (75 mg total) by mouth 2 (two) times daily. 180 tablet 0  . traMADol (ULTRAM) 50 MG tablet Take 50 mg by mouth every 6 (six) hours as needed.    . traZODone (DESYREL) 50 MG tablet Take 50 mg by mouth at bedtime as needed for sleep.      No current facility-administered medications for this visit.     PAST MEDICAL HISTORY: Past Medical History:  Diagnosis Date  . Adenoma of colon   . Allergy    seasonal  . Anemia    with past pregnancy -not recent  . Anxiety   .  Arthritis   . Borderline diabetes    diet controlled - no meds  . Chronic cystitis   . Complication of anesthesia    02-20-2014 ([facility]) INTRA-OP RESPIRATORY FAILURE SECONDARY TO POSSIBLE MUCOUS ASPIRATION/  ALSO 06-06-2013 [facility]) POST-OP DESATURATION  EVEN USING CPAP, States some issues if Propofol given to rapidly.  . Diverticulitis   . Diverticulosis of colon   . Dyspnea   . Family history of adverse reaction to anesthesia    PER PT SISTER DIED AFTER GENERAL ANESTHESIA DUE TO UNDIAGNOSED OSA  . GERD (gastroesophageal reflux disease)   . H/O hiatal hernia   . Headache(784.0)    migraines, decreased since turning 50's  . History of chronic cough    "tight sounding cough"  . History of esophageal dilatation   . History of kidney stones   . History of MRSA infection    3/ 2015  AXILLARY ABSCESS  . History of recurrent UTIs   . History of TIAs NO RESIDUAL   1980;  2005;   2008 PT STATES PER CT  SCARRING RIGHT SIDE OF BRAIN  . Hyperlipidemia   . Hypertension   . Kidney disease    III  . Neuromuscular disorder ([place])    HH  . Neuropathy    pt denies this 07-05-2015  . Nocturia   . OSA on CPAP    PER SLEEP STUDY 09-08-2012  MODERATE OSA. not used in awhile, but thinks needs to start back using due to some weight gain.  . Osteoarthritis of knee    Right  . Sleep apnea    uses CPAP most nights  . Stroke [facility]) [phone]   TIA'S  . SUI (stress urinary incontinence, female)   . SVD (spontaneous vaginal delivery)    x 3    PAST SURGICAL HISTORY: Past Surgical History:  Procedure Laterality Date  . ANTERIOR AND POSTERIOR REPAIR N/A 06/06/2013   Procedure: Anterior vaginal vault repair, Sacrospinous ligament fixation with UPHOLD lite, Kelly plication, Sacrospinous mesh fixation, Solyx transurethral sling;  Surgeon: Ailene Rud, MD;  Location: [facility];  Service: Urology;  Laterality: N/A;  . CHOLECYSTECTOMY N/A 02/10/2017   Procedure: LAPAROSCOPIC  CHOLECYSTECTOMY;  Surgeon: Coralie Keens, MD;  Location: [facility];  Service: General;  Laterality: N/A;  . COLECTOMY WITH COLOSTOMY CREATION/HARTMANN PROCEDURE N/A 06/11/2017   Procedure: OPEN SIGMOID COLECTOMY;  Surgeon: [name], MD;  Location: [facility];  Service: General;  Laterality: N/A;  . COLONOSCOPY  08/2016   polyps - repeat 5 years [facility]  . CYSTOSCOPY W/ URETERAL STENT PLACEMENT Right 04/21/2014   Procedure: CYSTOSCOPY WITH RETROGRADE PYELOGRAM/URETERAL STENT PLACEMENT;  Surgeon: Ailene Rud, MD;  Location: [facility];  Service: Urology;  Laterality: Right;  . CYSTOSCOPY W/ URETERAL STENT PLACEMENT Right 11/21/2014   Procedure: CYSTOSCOPY  WITH RETROGRADE PYELOGRAM, URETEROSCOPY WITH STONE REMOVAL, URETERAL STENT PLACEMENT;  Surgeon: Carolan Clines, MD;  Location: [facility];  Service: Urology;  Laterality: Right;  . CYSTOSCOPY W/ URETERAL STENT PLACEMENT Left 02/01/2016   Procedure: CYSTO, LEFT URETEROSCOPY, LEFT RETROGRADE AND LEFT URETERAL STENT PLACEMENT;  Surgeon: Carolan Clines, MD;  Location: [facility];  Service: Urology;  Laterality: Left;  . CYSTOSCOPY WITH RETROGRADE PYELOGRAM, URETEROSCOPY AND STENT PLACEMENT Right 06/05/2014   Procedure: CYSTOSCOPY WITH RIGHT RETROGRADE PYELOGRAM, RIGHT URETEROSCOPY, STONE EXTRACTION AND RIGHT DOUBLE J STENT PLACEMENT;  Surgeon: Ailene Rud, MD;  Location: [facility];  Service: Urology;  Laterality: Right;  . ESOPHAGEAL DILATION  X2  LAST ONE  JUNE 2014   has had 9 of these  . ESOPHAGEAL MANOMETRY N/A 09/10/2015   Procedure: ESOPHAGEAL MANOMETRY (EM);  Surgeon: Manus Gunning, MD;  Location: [facility];  Service: Gastroenterology;  Laterality: N/A;  . HOLMIUM LASER APPLICATION Right 68/34/1962   Procedure: HOLMIUM LASER OF STONE ;  Surgeon: Ailene Rud, MD;  Location: [facility];  Service: Urology;  Laterality: Right;  . KNEE ARTHROSCOPY Bilateral 2008   x 2  .  LAPAROSCOPIC CHOLECYSTECTOMY  02/10/2017  . MULTIPLE TOOTH EXTRACTIONS     past hx  . RECTOCELE REPAIR N/A 02/20/2014   Procedure: POSTERIOR REPAIR (RECTOCELE) WITH VAGINAL VAULT REPAIR WITH ACELL GRAFT PLACEMENT, PERINEOPLASTY, ENTEROCELE REPAIR;  Surgeon: Ailene Rud, MD;  Location: [facility];  Service: Urology;  Laterality: N/A;  . TUBAL LIGATION  1987  . UPPER GASTROINTESTINAL ENDOSCOPY  07/29/2018   [facility] - need to repeat per MD  . WISDOM TOOTH EXTRACTION      FAMILY HISTORY: Family History  Problem Relation Age of Onset  . Colon cancer Father 71       died at 54  . Heart disease Mother        died at 39  . Heart attack Mother   . Heart attack Sister        died at 46  . Hypertension Brother   . Heart attack Cousin 51       with death  . Heart attack Cousin 44       died with MI  . Esophageal cancer Neg Hx   . Rectal cancer Neg Hx   . Stomach cancer Neg Hx   . Colon polyps Neg Hx     SOCIAL HISTORY: Social History   Socioeconomic History  . Marital status: Married    Spouse name: Herbie Baltimore  . Number of children: 3  . Years of education: 55  . Highest education level: Not on file  Occupational History  . Occupation: Geographical information systems officer  . Financial resource strain: Not on file  . Food insecurity    Worry: Not on file    Inability: Not on file  . Transportation needs    Medical: Not on file    Non-medical: Not on file  Tobacco Use  . Smoking status: Never Smoker  . Smokeless tobacco: Never Used  Substance and Sexual Activity  . Alcohol use: No    Alcohol/week: 0.0 standard drinks  . Drug use: No  . Sexual activity: Never    Birth control/protection: Post-menopausal  Lifestyle  . Physical activity    Days per week: Not on file    Minutes per session: Not on file  . Stress: Not on file  Relationships  . Social connections    Talks on phone: Not on  file    Gets together: Not on file    Attends religious service: Not on file    Active member  of club or organization: Not on file    Attends meetings of clubs or organizations: Not on file    Relationship status: Not on file  . Intimate partner violence    Fear of current or ex partner: Not on file    Emotionally abused: Not on file    Physically abused: Not on file    Forced sexual activity: Not on file  Other Topics Concern  . Not on file  Social History Narrative   Patient lives at home with her husband 17 years. Herbie Baltimore). Patient has a 12th grade education.    Right handed.    Caffeine - two daily.     PHYSICAL EXAM   Vitals:   01/06/19 1300  BP: (!) 151/94  Pulse: 68  Temp: (!) 96.6 F (35.9 C)  Weight: 233 lb 8 oz (105.9 kg)  Height: '5\' 2"'  (1.575 m)    Not recorded      Body mass index is 42.71 kg/m.  PHYSICAL EXAMNIATION:  Gen: NAD, conversant, well nourised, obese, well groomed                     Cardiovascular: Regular rate rhythm, no peripheral edema, warm, nontender. Eyes: Conjunctivae clear without exudates or hemorrhage Neck: Supple, no carotid bruits. Pulmonary: Clear to auscultation bilaterally   NEUROLOGICAL EXAM:  MENTAL STATUS: Speech:    Speech is normal; fluent and spontaneous with normal comprehension.  Cognition:     Orientation to time, place and person     Normal recent and remote memory     Normal Attention span and concentration     Normal Language, naming, repeating,spontaneous speech     Fund of knowledge   CRANIAL NERVES: CN II: Visual fields are full to confrontation.  Pupils are round equal and briskly reactive to light. CN III, IV, VI: extraocular movement are normal. No ptosis. CN V: Facial sensation is intact to pinprick in all 3 divisions bilaterally. Corneal responses are intact.  CN VII: Face is symmetric with normal eye closure and smile. CN VIII: Hearing is normal to rubbing fingers CN IX, X: Palate elevates symmetrically. Phonation is normal. CN XI: Head turning and shoulder shrug are intact CN XII:  Tongue is midline with normal movements and no atrophy.  MOTOR: There is no pronator drift of out-stretched arms. Muscle bulk and tone are normal. Muscle strength is normal.  REFLEXES: Reflexes are 2+ and symmetric at the biceps, triceps, knees, and ankles. Plantar responses are flexor.  SENSORY: Intact to light touch, pinprick, positional sensation and vibratory sensation are intact in fingers and toes.  COORDINATION: Rapid alternating movements and fine finger movements are intact. There is no dysmetria on finger-to-nose and heel-knee-shin.    GAIT/STANCE: She needs pushed up to get up from seated position, antalgic cautious unsteady gait   DIAGNOSTIC DATA (LABS, IMAGING, TESTING) - I reviewed patient records, labs, notes, testing and imaging myself where available.   ASSESSMENT AND PLAN  LAKIAH DHINGRA is a 63 y.o. female   Chronic migraine headaches  Continue Topamax 100 mg twice a day as preventive medications  Add on Aimovig 70 mg every 30 days as preventive medications  Cambia as needed    Marcial Pacas, M.D. Ph.D.  [facility] [address] [place], [place] [address] Ph: [phone] Fax: [phone]  CC: Berkley Harvey, NP

## 2019-01-11 ENCOUNTER — Telehealth: Payer: Self-pay | Admitting: *Deleted

## 2019-01-11 NOTE — Telephone Encounter (Signed)
PA for Cambia started through [employer].  JH#[id number] W.  Pt SX#[id number] O.  Decision pending.

## 2019-01-12 MED ORDER — CAMBIA 50 MG PO PACK: 50.0000 mg | PACK | ORAL | 11 refills | Status: DC | PRN

## 2019-01-12 NOTE — Addendum Note (Signed)
Addended by: Desmond Lope on: 01/12/2019 09:35 AM   Modules accepted: Orders

## 2019-01-12 NOTE — Telephone Encounter (Signed)
PA approved through 01/06/2020.  Her pharmacy has been informed of the approval.  [place] Medicaid will only cover #9/packets per month.

## 2019-03-02 ENCOUNTER — Telehealth: Payer: Self-pay | Admitting: *Deleted

## 2019-03-02 NOTE — Telephone Encounter (Signed)
The patient requested to cancel her follow up appt.  Stated she plans to continue her care for migraine treatment with her PCP.

## 2019-03-03 ENCOUNTER — Other Ambulatory Visit: Payer: Self-pay | Admitting: Orthopedic Surgery

## 2019-03-15 ENCOUNTER — Ambulatory Visit: Payer: Medicaid Other | Admitting: Neurology

## 2019-03-18 DIAGNOSIS — M1712 Unilateral primary osteoarthritis, left knee: Secondary | ICD-10-CM | POA: Diagnosis present

## 2019-03-18 NOTE — H&P (Signed)
TOTAL KNEE ADMISSION H&P  Patient is being admitted for left total knee arthroplasty.  Subjective:  Chief Complaint:left knee pain.  HPI: Ann [name], 63 y.o. female, has a history of pain and functional disability in the left knee due to arthritis and has failed non-surgical conservative treatments for greater than 12 weeks to includeNSAID's and/or analgesics, corticosteriod injections, use of assistive devices, weight reduction as appropriate and activity modification.  Onset of symptoms was gradual, starting 2 years ago with gradually worsening course since that time. The patient noted no past surgery on the left knee(s).  Patient currently rates pain in the left knee(s) at 10 out of 10 with activity. Patient has night pain, worsening of pain with activity and weight bearing, pain that interferes with activities of daily living and pain with passive range of motion.  Patient has evidence of joint space narrowing by imaging studies.   There is no active infection.  Patient Active Problem List   Diagnosis Date Noted  . Degenerative arthritis of left knee 03/18/2019  . Rash 08/10/2018  . Pruritic rash 08/09/2018  . Headache 08/09/2018  . Osteoarthritis of right knee 08/28/2017  . Abnormal CT scan, colon   . HLD (hyperlipidemia) 06/03/2017  . 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 GERD (gastroesophageal reflux disease) 06/03/2017  . Anxiety 06/03/2017  . Chronic combined systolic (congestive) and diastolic (congestive) heart failure ([place]) 06/03/2017  . Hypokalemia 06/03/2017  . Diverticulitis large intestine 06/03/2017  . Lower abdominal pain 06/03/2017  . Non-intractable vomiting with nausea   . Abnormal CT of the abdomen   . Generalized abdominal pain   . LFT elevation   . Diverticulitis of colon 05/18/2017  . Symptomatic cholelithiasis 02/10/2017  . Hypoxia 01/11/2017  . Dyspnea 01/11/2017  . Dysphagia 04/04/2015  . History of esophageal stricture 04/04/2015  . Morbid (severe)  obesity due to excess calories ([place]) 04/01/2015  . ACE-inhibitor cough 03/12/2015  . Upper airway cough syndrome 03/12/2015  . Laryngopharyngeal reflux (LPR) 03/12/2015  . Elevated lipids 07/18/2014  . Chest pain 07/18/2014  . Internal hemorrhoids 04/25/2014  . Ureteral stone with hydronephrosis 04/22/2014  . Post-op pain 04/21/2014  . Rectocele, grade 3 02/20/2014  . Female rectocele without uterine prolapse 08/04/2013  . Unspecified constipation 07/23/2013  . Chronic respiratory failure with hypoxia ([place]) 06/07/2013  . Cystocele 06/06/2013  . Anosmia 01/25/2013  . Unspecified nutritional deficiency   . Polyneuropathy in other diseases classified elsewhere ([place])   . OSA (obstructive sleep apnea)   . Cough 07/27/2012  . Essential hypertension    Past Medical History:  Diagnosis Date  . Adenoma of colon   . Allergy    seasonal  . Anemia    with past pregnancy -not recent  . Anxiety   . Arthritis   . Borderline diabetes    diet controlled - no meds  . Chronic cystitis   . Complication of anesthesia    02-20-2014 ([facility]) INTRA-OP RESPIRATORY FAILURE SECONDARY TO POSSIBLE MUCOUS ASPIRATION/  ALSO 06-06-2013 [facility]) POST-OP DESATURATION  EVEN USING CPAP, States some issues if Propofol given to rapidly.  . Diverticulitis   . Diverticulosis of colon   . Dyspnea   . Family history of adverse reaction to anesthesia    PER PT SISTER DIED AFTER GENERAL ANESTHESIA DUE TO UNDIAGNOSED OSA  . GERD (gastroesophageal reflux disease)   . H/O hiatal hernia   . Headache(784.0)    migraines, decreased since turning 50's  . History of chronic cough    "  tight sounding cough"  . History of esophageal dilatation   . History of kidney stones   . History of MRSA infection    3/ 2015  AXILLARY ABSCESS  . History of recurrent UTIs   . History of TIAs NO RESIDUAL   1980;  2005;   2008 PT STATES PER CT  SCARRING RIGHT SIDE OF BRAIN  . Hyperlipidemia   . Hypertension   . Kidney disease    III  .  Neuromuscular disorder ([place])    HH  . Neuropathy    pt denies this 07-05-2015  . Nocturia   . OSA on CPAP    PER SLEEP STUDY 09-08-2012  MODERATE OSA. not used in awhile, but thinks needs to start back using due to some weight gain.  . Osteoarthritis of knee    Right  . Sleep apnea    uses CPAP most nights  . Stroke [facility]) [phone]   TIA'S  . SUI (stress urinary incontinence, female)   . SVD (spontaneous vaginal delivery)    x 3    Past Surgical History:  Procedure Laterality Date  . ANTERIOR AND POSTERIOR REPAIR N/A 06/06/2013   Procedure: Anterior vaginal vault repair, Sacrospinous ligament fixation with UPHOLD lite, Kelly plication, Sacrospinous mesh fixation, Solyx transurethral sling;  Surgeon: Ailene Rud, MD;  Location: [facility];  Service: Urology;  Laterality: N/A;  . CHOLECYSTECTOMY N/A 02/10/2017   Procedure: LAPAROSCOPIC CHOLECYSTECTOMY;  Surgeon: Coralie Keens, MD;  Location: [place];  Service: General;  Laterality: N/A;  . COLECTOMY WITH COLOSTOMY CREATION/HARTMANN PROCEDURE N/A 06/11/2017   Procedure: OPEN SIGMOID COLECTOMY;  Surgeon: [name], MD;  Location[facility];  Service: General;  Laterality: N/A;  . COLONOSCOPY  08/2016   polyps - repeat 5 years [place]  . CYSTOSCOPY W/ URETERAL STENT PLACEMENT Right 04/21/2014   Procedure: CYSTOSCOPY WITH RETROGRADE PYELOGRAM/URETERAL STENT PLACEMENT;  Surgeon: Ailene Rud, MD;  [facility];  Service: Urology;  Laterality: Right;  . CYSTOSCOPY W/ URETERAL STENT PLACEMENT Right 11/21/2014   Procedure: CYSTOSCOPY WITH RETROGRADE PYELOGRAM, URETEROSCOPY WITH STONE REMOVAL, URETERAL STENT PLACEMENT;  Surgeon: Carolan Clines, MD;  [facility];  Service: Urology;  Laterality: Right;  . CYSTOSCOPY W/ URETERAL STENT PLACEMENT Left 02/01/2016   Procedure: CYSTO, LEFT URETEROSCOPY, LEFT RETROGRADE AND LEFT URETERAL STENT PLACEMENT;  Surgeon: Carolan Clines, MD;  [facility];  Service: Urology;  Laterality: Left;  . CYSTOSCOPY WITH RETROGRADE PYELOGRAM, URETEROSCOPY AND STENT PLACEMENT Right 06/05/2014   Procedure: CYSTOSCOPY WITH RIGHT RETROGRADE PYELOGRAM, RIGHT URETEROSCOPY, STONE EXTRACTION AND RIGHT DOUBLE J STENT PLACEMENT;  Surgeon: Ailene Rud, MD;  Location: [facility];  Service: Urology;  Laterality: Right;  . ESOPHAGEAL DILATION  X2  LAST ONE  JUNE 2014   has had 9 of these  . ESOPHAGEAL MANOMETRY N/A 09/10/2015   Procedure: ESOPHAGEAL MANOMETRY (EM);  Surgeon: Manus Gunning, MD;  [facility];  Service: Gastroenterology;  Laterality: N/A;  . HOLMIUM LASER APPLICATION Right [id number]   Procedure: HOLMIUM LASER OF STONE ;  Surgeon: Ailene Rud, MD;  Location[facility];  Service: Urology;  Laterality: Right;  . KNEE ARTHROSCOPY Bilateral 2008   x 2  . LAPAROSCOPIC CHOLECYSTECTOMY  02/10/2017  . MULTIPLE TOOTH EXTRACTIONS     past hx  . RECTOCELE REPAIR N/A 02/20/2014   Procedure: POSTERIOR REPAIR (RECTOCELE) WITH VAGINAL VAULT REPAIR WITH ACELL GRAFT PLACEMENT, PERINEOPLASTY, ENTEROCELE REPAIR;  Surgeon: Ailene Rud, MD;  [facility];  Service: Urology;  Laterality: N/A;  . TUBAL LIGATION  1987  . UPPER GASTROINTESTINAL ENDOSCOPY  07/29/2018   [place] - need to repeat per MD  . Arnetha Courser TOOTH EXTRACTION      No current facility-administered medications for this encounter.    Current Outpatient Medications  Medication Sig Dispense Refill Last Dose  . amLODipine (NORVASC) 10 MG tablet Take 10 mg by mouth daily.      Marland Kitchen atorvastatin (LIPITOR) 10 MG tablet Take 10 mg by mouth at bedtime.   3   . budesonide-formoterol (SYMBICORT) 160-4.5 MCG/ACT inhaler Inhale 2 puffs into the lungs 2 (two) times daily as needed (Shortness of breath).      . chlorpheniramine (CHLOR-TRIMETON) 4 MG tablet Take 1 tablet (4 mg total) by mouth at bedtime. (Patient taking differently: Take 4  mg by mouth at bedtime. Will take 2 if coughing more) 14 tablet 0   . clonazePAM (KLONOPIN) 0.5 MG tablet Take 0.5 mg by mouth at bedtime.      . hydrochlorothiazide (HYDRODIURIL) 25 MG tablet Take 25 mg by mouth daily as needed (swelling).      . metoprolol succinate (TOPROL-XL) 50 MG 24 hr tablet Take 50 mg by mouth at bedtime.   3   . omeprazole (PRILOSEC) 40 MG capsule TAKE 1 CAPSULE(40 MG) BY MOUTH TWICE DAILY (Patient taking differently: Take 40 mg by mouth 2 (two) times daily. ) 120 capsule 0   . topiramate (TOPAMAX) 100 MG tablet Take 1 tablet (100 mg total) by mouth 2 (two) times daily. 60 tablet 11   . traMADol (ULTRAM) 50 MG tablet Take 50 mg by mouth every 6 (six) hours as needed (pain).      . traZODone (DESYREL) 50 MG tablet Take 50 mg by mouth at bedtime as needed for sleep.       Allergies  Allergen Reactions  . Lisinopril Cough  . Doxycycline Diarrhea    Severe headaches  . Metronidazole Other (See Comments)    Severe headaches BUT CAN BE TOLERATED    Social History   Tobacco Use  . Smoking status: Never Smoker  . Smokeless tobacco: Never Used  Substance Use Topics  . Alcohol use: No    Alcohol/week: 0.0 standard drinks    Family History  Problem Relation Age of Onset  . Colon cancer Father 8       died at 65  . Heart disease Mother        died at 17  . Heart attack Mother   . Heart attack Sister        died at 37  . Hypertension Brother   . Heart attack Cousin 61       with death  . Heart attack Cousin 5       died with MI  . Esophageal cancer Neg Hx   . Rectal cancer Neg Hx   . Stomach cancer Neg Hx   . Colon polyps Neg Hx      Review of Systems  Constitutional: Positive for malaise/fatigue.  HENT: Negative.   Eyes: Negative.   Respiratory: Negative.   Cardiovascular:       HTN  Gastrointestinal:       Rectal bleeding  Genitourinary: Negative.   Musculoskeletal: Positive for joint pain.  Skin: Negative.   Neurological: Positive for  headaches.  Endo/Heme/Allergies: Negative.   Psychiatric/Behavioral: Negative.     Objective:  Physical Exam  Constitutional: She is oriented to person, place,  and time. She appears well-developed and well-nourished.  HENT:  Head: Normocephalic and atraumatic.  Eyes: Pupils are equal, round, and reactive to light.  Neck: Normal range of motion. Neck supple.  Cardiovascular: Intact distal pulses.  Respiratory: Effort normal.  Musculoskeletal:        General: Tenderness present.     Comments: Both knees have impressive varus deformities tender along the medial joint lines both knees have 5-10 flexion contractures and flex 115 with pain.  Quadriceps and hamstring power is intact although she has generalized weakness from disuse.  Neurological: She is alert and oriented to person, place, and time.  Skin: Skin is warm and dry.  Psychiatric: She has a normal mood and affect. Her behavior is normal. Judgment and thought content normal.    Vital signs in last 24 hours: BP: ()/()  Arterial Line BP: ()/()   Labs:   Estimated body mass index is 42.71 kg/m as calculated from the following:   Height as of 01/06/19: 5\' 2"  (1.575 m).   Weight as of 01/06/19: 105.9 kg.   Imaging Review Plain radiographs demonstrate  AP Rosen were lateral and sunrise x-rays show progressive end-stage arthritis of both knees bone-on-bone lateral subluxation of tibia beneath femur and peripheral osteophytes.   Assessment/Plan:  End stage arthritis, left knee   The patient history, physical examination, clinical judgment of the provider and imaging studies are consistent with end stage degenerative joint disease of the left knee(s) and total knee arthroplasty is deemed medically necessary. The treatment options including medical management, injection therapy arthroscopy and arthroplasty were discussed at length. The risks and benefits of total knee arthroplasty were presented and reviewed. The risks due to  aseptic loosening, infection, stiffness, patella tracking problems, thromboembolic complications and other imponderables were discussed. The patient acknowledged the explanation, agreed to proceed with the plan and consent was signed. Patient is being admitted for inpatient treatment for surgery, pain control, PT, OT, prophylactic antibiotics, VTE prophylaxis, progressive ambulation and ADL's and discharge planning. The patient is planning to be discharged home with home health services     Patient's anticipated LOS is less than 2 midnights, meeting these requirements: - Younger than 49 - Lives within 1 hour of care - Has a competent adult at home to recover with post-op recover - NO history of  - Chronic pain requiring opiods  - Diabetes  - Coronary Artery Disease  - Heart failure  - Heart attack  - Stroke  - DVT/VTE  - Cardiac arrhythmia  - Respiratory Failure/COPD  - Renal failure  - Anemia  - Advanced Liver disease

## 2019-03-21 NOTE — Patient Instructions (Addendum)
DUE TO COVID-19 ONLY ONE VISITOR IS ALLOWED TO COME WITH YOU AND STAY IN THE WAITING ROOM ONLY DURING PRE OP AND PROCEDURE DAY OF SURGERY. THE 1 VISITOR MAY VISIT WITH YOU AFTER SURGERY IN YOUR PRIVATE ROOM DURING VISITING HOURS ONLY!  YOU NEED TO HAVE A COVID 19 TEST ON__thurs 9/17_____ @__9 :00_____, THIS TEST MUST BE DONE BEFORE SURGERY, COME  [place], [address] [place] , [id number].  ([place])  ONCE YOUR COVID TEST IS COMPLETED, PLEASE BEGIN THE QUARANTINE INSTRUCTIONS AS OUTLINED IN YOUR HANDOUT.                KEIANNA HARTSOCK    Your procedure is scheduled on: 03/28/19   Report to [facility] Main  Entrance   Report to admitting at  11:10 AM     Call this number if you have problems the morning of surgery [place], NO CHEWING GUM [place].    Do not eat food After Midnight.   YOU MAY HAVE CLEAR LIQUIDS FROM MIDNIGHT UNTIL 4:30AM.   At 4:30AM Please finish the prescribed Pre-Surgery Gatorade drink.   Nothing by mouth after you finish the Gatorade drink !   Take these medicines the morning of surgery with A SIP OF WATER: Topamax, Amlodipine, Prilosec              Please bring your mask and tubing to the hospital with you                                 You may not have any metal on your body including hair pins and              piercings              Do not wear jewelry,  lotions, powders or  deodorant             Do not wear nail polish.  Do not shave  48 hours prior to surgery.             Do not bring valuables to the hospital. [place].  Contacts, dentures or bridgework may not be worn into surgery.      Patients discharged the day of surgery will not be allowed to drive home.  IF YOU ARE HAVING SURGERY AND GOING HOME THE SAME DAY, YOU MUST HAVE AN ADULT TO DRIVE YOU HOME AND BE WITH YOU FOR 24 HOURS.  YOU MAY GO  HOME BY TAXI OR UBER OR ORTHERWISE, BUT AN ADULT MUST ACCOMPANY YOU HOME AND STAY WITH YOU FOR 24 HOURS.   Name and phone number of your driver:  Special Instructions: N/A              Please read over the following fact sheets you were given: _____________________________________________________________________             [facility] - Preparing for Surgery  Before surgery, you can play an important role.   Because skin is not sterile, your skin needs to be as free of germs as possible.   You can reduce the number of germs on your skin by washing with CHG (chlorahexidine gluconate) soap before surgery.   CHG is  an antiseptic cleaner which kills germs and bonds with the skin to continue killing germs even after washing. Please DO NOT use if you have an allergy to CHG or antibacterial soaps.   If your skin becomes reddened/irritated stop using the CHG and inform your nurse when you arrive at Short Stay. Do not shave (including legs and underarms) for at least 48 hours prior to the first CHG shower.  Please follow these instructions carefully:  1.  Shower with CHG Soap the night before surgery and the  morning of Surgery.  2.  If you choose to wash your hair, wash your hair first as usual with your  normal  shampoo.  3.  After you shampoo, rinse your hair and body thoroughly to remove the  shampoo.                                        4.  Use CHG as you would any other liquid soap.  You can apply chg directly  to the skin and wash                       Gently with a scrungie or clean washcloth.  5.  Apply the CHG Soap to your body ONLY FROM THE NECK DOWN.   Do not use on face/ open                           Wound or open sores. Avoid contact with eyes, ears mouth and genitals (private parts).                       Wash face,  Genitals (private parts) with your normal soap.             6.  Wash thoroughly, paying special attention to the area where your surgery  will be performed.  7.   Thoroughly rinse your body with warm water from the neck down.  8.  DO NOT shower/wash with your normal soap after using and rinsing off  the CHG Soap.             9.  Pat yourself dry with a clean towel.            10.  Wear clean pajamas.            11.  Place clean sheets on your bed the night of your first shower and do not  sleep with pets.  Day of Surgery : Do not apply any lotions/deodorants the morning of surgery.  Please wear clean clothes to the hospital/surgery center.   FAILURE TO FOLLOW THESE INSTRUCTIONS MAY RESULT IN THE CANCELLATION OF YOUR SURGERY PATIENT SIGNATURE_________________________________  NURSE SIGNATURE__________________________________  ________________________________________________________________________   Adam Phenix  An incentive spirometer is a tool that can help keep your lungs clear and active. This tool measures how well you are filling your lungs with each breath. Taking long deep breaths may help reverse or decrease the chance of developing breathing (pulmonary) problems (especially infection) following:  A long period of time when you are unable to move or be active. BEFORE THE PROCEDURE   If the spirometer includes an indicator to show your best effort, your nurse or respiratory therapist will set it to a desired goal.  If possible, sit up straight or lean slightly  forward. Try not to slouch.  Hold the incentive spirometer in an upright position. INSTRUCTIONS FOR USE  1. Sit on the edge of your bed if possible, or sit up as far as you can in bed or on a chair. 2. Hold the incentive spirometer in an upright position. 3. Breathe out normally. 4. Place the mouthpiece in your mouth and seal your lips tightly around it. 5. Breathe in slowly and as deeply as possible, raising the piston or the ball toward the top of the column. 6. Hold your breath for 3-5 seconds or for as long as possible. Allow the piston or ball to fall to the  bottom of the column. 7. Remove the mouthpiece from your mouth and breathe out normally. 8. Rest for a few seconds and repeat Steps 1 through 7 at least 10 times every 1-2 hours when you are awake. Take your time and take a few normal breaths between deep breaths. 9. The spirometer may include an indicator to show your best effort. Use the indicator as a goal to work toward during each repetition. 10. After each set of 10 deep breaths, practice coughing to be sure your lungs are clear. If you have an incision (the cut made at the time of surgery), support your incision when coughing by placing a pillow or rolled up towels firmly against it. Once you are able to get out of bed, walk around indoors and cough well. You may stop using the incentive spirometer when instructed by your caregiver.  RISKS AND COMPLICATIONS  Take your time so you do not get dizzy or light-headed.  If you are in pain, you may need to take or ask for pain medication before doing incentive spirometry. It is harder to take a deep breath if you are having pain. AFTER USE  Rest and breathe slowly and easily.  It can be helpful to keep track of a log of your progress. Your caregiver can provide you with a simple table to help with this. If you are using the spirometer at home, follow these instructions: [place] IF:   You are having difficultly using the spirometer.  You have trouble using the spirometer as often as instructed.  Your pain medication is not giving enough relief while using the spirometer.  You develop fever of 100.5 F (38.1 C) or higher. SEEK IMMEDIATE MEDICAL CARE IF:   You cough up bloody sputum that had not been present before.  You develop fever of 102 F (38.9 C) or greater.  You develop worsening pain at or near the incision site. MAKE SURE YOU:   Understand these instructions.  Will watch your condition.  Will get help right away if you are not doing well or get  worse. Document Released: 11/03/2006 Document Revised: 09/15/2011 Document Reviewed: 01/04/2007 [facility] Patient Information 2014 [place], [place].   ________________________________________________________________________

## 2019-03-22 ENCOUNTER — Ambulatory Visit (HOSPITAL_COMMUNITY)
Admission: RE | Admit: 2019-03-22 | Discharge: 2019-03-22 | Disposition: A | Discharge status: Home / Self Care | Payer: Medicaid Other | Source: Ambulatory Visit | Attending: Orthopedic Surgery | Admitting: Orthopedic Surgery

## 2019-03-22 ENCOUNTER — Encounter (HOSPITAL_COMMUNITY): Payer: Self-pay

## 2019-03-22 ENCOUNTER — Encounter (HOSPITAL_COMMUNITY)
Admission: RE | Admit: 2019-03-22 | Discharge: 2019-03-22 | Disposition: A | Discharge status: Home / Self Care | Payer: Medicaid Other | Source: Ambulatory Visit | Attending: Orthopedic Surgery | Admitting: Orthopedic Surgery

## 2019-03-22 ENCOUNTER — Other Ambulatory Visit: Payer: Self-pay

## 2019-03-22 DIAGNOSIS — Z01818 Encounter for other preprocedural examination: Secondary | ICD-10-CM | POA: Diagnosis not present

## 2019-03-22 LAB — URINALYSIS, ROUTINE W REFLEX MICROSCOPIC
Bilirubin Urine: NEGATIVE
Glucose, UA: NEGATIVE mg/dL
Ketones, ur: NEGATIVE mg/dL
Nitrite: NEGATIVE
Protein, ur: NEGATIVE mg/dL
Specific Gravity, Urine: 1.019 (ref 1.005–1.030)
pH: 6 (ref 5.0–8.0)

## 2019-03-22 LAB — APTT: aPTT: 30 seconds (ref 24–36)

## 2019-03-22 LAB — SURGICAL PCR SCREEN
MRSA, PCR: NEGATIVE
Staphylococcus aureus: NEGATIVE

## 2019-03-22 NOTE — Progress Notes (Signed)
PCP - Eldridge Abrahams NP Cardiologist - none  Chest x-ray - 03/22/19 EKG - 03/21/19 on chart Stress Test - no ECHO - no Cardiac Cath - no  Sleep Study - yes CPAP - uses C-Pap with O2 at night  Fasting Blood Sugar - NA Checks Blood Sugar _____ times a day  Blood Thinner Instructions:NA Aspirin Instructions: Last Dose:  Anesthesia review:   Patient denies shortness of breath, fever, cough and chest pain at PAT appointment yes  Patient verbalized understanding of instructions that were given to them at the PAT appointment. Patient was also instructed that they will need to review over the PAT instructions again at home before surgery. Yes Patient reports that she has lost weight and was just taken off O2 during the day and no longer is short of breath

## 2019-03-23 NOTE — Progress Notes (Signed)
Anesthesia Chart Review   Case: [id number] Date/Time: 03/28/19 1325   Procedure: LEFT TOTAL KNEE ARTHROPLASTY (Left Knee)   Anesthesia type: Spinal   Pre-op diagnosis: LEFT KNEE OSTEOARTHRITIS   Location: [place] [facility]   Surgeon: Frederik Pear, MD      DISCUSSION:62 y.o. never smoker with h/o HTN, hiatal hernia, GERD, HLD, TIAs, chronic respiratory failure (home O2, 4L/Pillow), CKD Stage III, left knee OA scheduled for above procedure 03/28/2019 with Dr. Frederik Pear.   Anesthesia history outlined in chart review 08/20/2017 by Myra Gianotti, PA-C.  Per her note, "02/10/17 (cholecystectomy, GETA) & 06/11/17 (open sigmoid colectomy, GETA): Extubated post-op, O2/[place]. No known anesthesia complications.  02/20/14 (posterior pelvic floor repair): Intra-op began coughing inTrendelenburg position, small amountsecretions coming up in LMA. Sats 100%. Patient suctioned, LMA removed and ETT placed. Lungs with wheezing and rhonchi noted, improved after indotracheal albuterol. In PACU difficultly maintaining saturation and placed on BiPAP. RUL airspace opacity on CXR, possible mucous aspiration. PCCM consulted. O2 for sats <92%. CPAP at HS. Out-patient f/u CXR.   06/06/13(anterior vaginal vault repair): Post-op respiratory failure, acute on chronic. Hospitalist consulted. Hypoxia-in pt with OSA and obesity hypoventilation s/p surgery with periop sedatives and pain meds/opiates possibly contributing to resp suppression leading to increased desatting with sleep.CXR showed bronchitic changes s/p Levofloxacin. CTA negative for PE. Noted patient was a mouth breather and used only the [place] and not CPAP overnight.  "some issues if Propofol given to rapidly" sister died following general anesthesia "due to undiagnosed OSA  Cardiologist is Dr. Marlou Porch, new 01/2017. He saw during 01/2017 hospitalization for mildly reduced EF and hypokinesis on echo. Coronary CTA showed no CAD. No further cardiology follow-up recommended.  (Previously saw Dr. Jenkins Rouge 07/18/14 for chest pain.) Last notation was from Truitt Merle, NP on 05/05/17 (medication adjustment due to elevated BP 150/100 during her husband's visit with Cecille Rubin)."  Last seen by pulmonologist, Dr. Christinia Gully, 11/14/2017.  Per OV note pt can follow up with pulmonology as needed.   Pt seen by PCP, Eldridge Abrahams, NP, for preoperative evaluation 03/21/2019.  Per OV note, "Pt is medically maximized for planned procedure under spinal anesthesia.  Clearance form completed.  Needs to have DVT prophylaxsis, hold plavix, asa and any vitamins 7 days prior. VSS."  Anticipate pt can proceed with planned procedure barring acute status change.   VS: Resp 18   Ht 5' 2.5" (1.588 m)   Wt 96.4 kg   BMI 38.24 kg/m   PROVIDERS: Berkley Harvey, NP is PCP   LABS: Labs reviewed: Acceptable for surgery. (all labs ordered are listed, but only abnormal results are displayed)  Labs Reviewed  URINALYSIS, ROUTINE W REFLEX MICROSCOPIC - Abnormal; Notable for the following components:      Result Value   Hgb urine dipstick SMALL (*)    Leukocytes,Ua TRACE (*)    Bacteria, UA RARE (*)    All other components within normal limits  SURGICAL PCR SCREEN  APTT  TYPE AND SCREEN     IMAGES: Chest Xray 03/22/2019 FINDINGS: Stable cardiomediastinal contours with heart size at the upper limits of normal. Central vascular congestion. No overt edema or new focal infiltrate. No pneumothorax or pleural effusion. Degenerative changes are seen in the thoracic spine.  IMPRESSION: No acute cardiopulmonary process.  EKG: 08/08/2018 Rate 68 bpm Sinus rhythm  Low voltage, precordial leads  CV: Echo 01/12/2017 Study Conclusions  - Left ventricle: The cavity size was normal. Wall thickness was  increased in a pattern of mild LVH. Systolic function was mildly   reduced. The estimated ejection fraction was in the range of 45%   to 50%. Diffuse hypokinesis. Doppler parameters are  consistent   with abnormal left ventricular relaxation (grade 1 diastolic   dysfunction).  Impressions:  - Mild global reduction in LV systolic function; mild diastolic   dysfunction; trace MR and TR. Past Medical History:  Diagnosis Date  . Adenoma of colon   . Allergy    seasonal  . Anemia    with past pregnancy -not recent  . Anxiety   . Arthritis   . Borderline diabetes    diet controlled - no meds  . Chronic cystitis   . Complication of anesthesia    02-20-2014 ([facility]) INTRA-OP RESPIRATORY FAILURE SECONDARY TO POSSIBLE MUCOUS ASPIRATION/  ALSO 06-06-2013 [facility]) POST-OP DESATURATION  EVEN USING CPAP, States some issues if Propofol given to rapidly.  . Diverticulitis   . Diverticulosis of colon   . Dyspnea    only needs O2 at Night  . Family history of adverse reaction to anesthesia    PER PT SISTER DIED AFTER GENERAL ANESTHESIA DUE TO UNDIAGNOSED OSA  . GERD (gastroesophageal reflux disease)   . H/O hiatal hernia   . Headache(784.0)    migraines, decreased since turning 50's  . History of chronic cough    "tight sounding cough"  . History of esophageal dilatation   . History of kidney stones   . History of MRSA infection    3/ 2015  AXILLARY ABSCESS  . History of recurrent UTIs   . History of TIAs NO RESIDUAL   1980;  2005;   2008 PT STATES PER CT  SCARRING RIGHT SIDE OF BRAIN  . Hyperlipidemia   . Hypertension   . Kidney disease    III  . Neuromuscular disorder ([place])    HH  . Neuropathy    pt denies this 07-05-2015  . Nocturia   . OSA on CPAP    PER SLEEP STUDY 09-08-2012  MODERATE OSA. not used in awhile, but thinks needs to start back using due to some weight gain.  . Osteoarthritis of knee    Right  . Sleep apnea    uses CPAP most nights  . Stroke [facility]) [phone]   TIA'S slight weakness on lt leg  . SUI (stress urinary incontinence, female)   . SVD (spontaneous vaginal delivery)    x 3    Past Surgical History:  Procedure Laterality Date  .  ANTERIOR AND POSTERIOR REPAIR N/A 06/06/2013   Procedure: Anterior vaginal vault repair, Sacrospinous ligament fixation with UPHOLD lite, Kelly plication, Sacrospinous mesh fixation, Solyx transurethral sling;  Surgeon: Ailene Rud, MD;  Location: [facility];  Service: Urology;  Laterality: N/A;  . CHOLECYSTECTOMY N/A 02/10/2017   Procedure: LAPAROSCOPIC CHOLECYSTECTOMY;  Surgeon: Coralie Keens, MD;  Location: [place];  Service: General;  Laterality: N/A;  . COLECTOMY WITH COLOSTOMY CREATION/HARTMANN PROCEDURE N/A 06/11/2017   Procedure: OPEN SIGMOID COLECTOMY;  Surgeon: [name], MD;  Location: [facility];  Service: General;  Laterality: N/A;  . COLONOSCOPY  08/2016   polyps - repeat 5 years [place]  . CYSTOSCOPY W/ URETERAL STENT PLACEMENT Right 04/21/2014   Procedure: CYSTOSCOPY WITH RETROGRADE PYELOGRAM/URETERAL STENT PLACEMENT;  Surgeon: Ailene Rud, MD;  Location: [facility];  Service: Urology;  Laterality: Right;  . CYSTOSCOPY W/ URETERAL STENT PLACEMENT Right 11/21/2014   Procedure: CYSTOSCOPY WITH RETROGRADE PYELOGRAM, URETEROSCOPY WITH STONE  REMOVAL, URETERAL STENT PLACEMENT;  Surgeon: Carolan Clines, MD;  Location: [facility];  Service: Urology;  Laterality: Right;  . CYSTOSCOPY W/ URETERAL STENT PLACEMENT Left 02/01/2016   Procedure: CYSTO, LEFT URETEROSCOPY, LEFT RETROGRADE AND LEFT URETERAL STENT PLACEMENT;  Surgeon: Carolan Clines, MD;  Location: [facility];  Service: Urology;  Laterality: Left;  . CYSTOSCOPY WITH RETROGRADE PYELOGRAM, URETEROSCOPY AND STENT PLACEMENT Right 06/05/2014   Procedure: CYSTOSCOPY WITH RIGHT RETROGRADE PYELOGRAM, RIGHT URETEROSCOPY, STONE EXTRACTION AND RIGHT DOUBLE J STENT PLACEMENT;  Surgeon: Ailene Rud, MD;  Location: [facility];  Service: Urology;  Laterality: Right;  . ESOPHAGEAL DILATION  X2  LAST ONE  JUNE 2014   has had 9 of these  . ESOPHAGEAL MANOMETRY N/A 09/10/2015   Procedure: ESOPHAGEAL  MANOMETRY (EM);  Surgeon: Manus Gunning, MD;  [facility];  Service: Gastroenterology;  Laterality: N/A;  . FRACTURE SURGERY  09/2018   broken nose no surgery  . HOLMIUM LASER APPLICATION Right [id number]   Procedure: HOLMIUM LASER OF STONE ;  Surgeon: Ailene Rud, MD;  [facility];  Service: Urology;  Laterality: Right;  . KNEE ARTHROSCOPY Bilateral 2008   x 2  . LAPAROSCOPIC CHOLECYSTECTOMY  02/10/2017  . MULTIPLE TOOTH EXTRACTIONS     past hx  . RECTOCELE REPAIR N/A 02/20/2014   Procedure: POSTERIOR REPAIR (RECTOCELE) WITH VAGINAL VAULT REPAIR WITH ACELL GRAFT PLACEMENT, PERINEOPLASTY, ENTEROCELE REPAIR;  Surgeon: Ailene Rud, MD;  Location: [facility];  Service: Urology;  Laterality: N/A;  . TUBAL LIGATION  1987  . UPPER GASTROINTESTINAL ENDOSCOPY  07/29/2018   [place] - need to repeat per MD  . Arnetha Courser TOOTH EXTRACTION      MEDICATIONS: . amLODipine (NORVASC) 10 MG tablet  . atorvastatin (LIPITOR) 10 MG tablet  . budesonide-formoterol (SYMBICORT) 160-4.5 MCG/ACT inhaler  . chlorpheniramine (CHLOR-TRIMETON) 4 MG tablet  . clonazePAM (KLONOPIN) 0.5 MG tablet  . hydrochlorothiazide (HYDRODIURIL) 25 MG tablet  . metoprolol succinate (TOPROL-XL) 50 MG 24 hr tablet  . omeprazole (PRILOSEC) 40 MG capsule  . topiramate (TOPAMAX) 100 MG tablet  . traMADol (ULTRAM) 50 MG tablet  . traZODone (DESYREL) 50 MG tablet   No current facility-administered medications for this encounter.

## 2019-03-24 ENCOUNTER — Other Ambulatory Visit (HOSPITAL_COMMUNITY)
Admission: RE | Admit: 2019-03-24 | Discharge: 2019-03-24 | Disposition: A | Discharge status: Home / Self Care | Payer: Medicaid Other | Source: Ambulatory Visit | Attending: Orthopedic Surgery | Admitting: Orthopedic Surgery

## 2019-03-24 DIAGNOSIS — Z01812 Encounter for preprocedural laboratory examination: Secondary | ICD-10-CM | POA: Insufficient documentation

## 2019-03-24 DIAGNOSIS — Z20828 Contact with and (suspected) exposure to other viral communicable diseases: Secondary | ICD-10-CM | POA: Insufficient documentation

## 2019-03-25 LAB — NOVEL CORONAVIRUS, NAA (HOSP ORDER, SEND-OUT TO REF LAB; TAT 18-24 HRS): SARS-CoV-2, NAA: NOT DETECTED

## 2019-03-27 MED ORDER — TRANEXAMIC ACID 1000 MG/10ML IV SOLN
2000.0000 mg | INTRAVENOUS | Status: DC
  Filled 2019-03-27: qty 20

## 2019-03-27 MED ORDER — BUPIVACAINE LIPOSOME 1.3 % IJ SUSP
20.0000 mL | Freq: Once | INTRAMUSCULAR | Status: DC
  Filled 2019-03-27: qty 20

## 2019-03-28 ENCOUNTER — Other Ambulatory Visit: Payer: Self-pay

## 2019-03-28 ENCOUNTER — Inpatient Hospital Stay (HOSPITAL_COMMUNITY)
Admission: RE | Admit: 2019-03-28 | Discharge: 2019-03-29 | DRG: 470 | Disposition: A | Discharge status: Home Health | Payer: Medicaid Other | Attending: Orthopedic Surgery | Admitting: Orthopedic Surgery

## 2019-03-28 ENCOUNTER — Inpatient Hospital Stay (HOSPITAL_COMMUNITY): Payer: Medicaid Other | Admitting: Anesthesiology

## 2019-03-28 ENCOUNTER — Inpatient Hospital Stay (HOSPITAL_COMMUNITY): Payer: Medicaid Other | Admitting: Physician Assistant

## 2019-03-28 ENCOUNTER — Encounter (HOSPITAL_COMMUNITY): Payer: Self-pay

## 2019-03-28 ENCOUNTER — Encounter (HOSPITAL_COMMUNITY)
Admission: RE | Disposition: A | Discharge status: Home Health | Payer: Self-pay | Source: Home / Self Care | Attending: Orthopedic Surgery

## 2019-03-28 DIAGNOSIS — I5042 Chronic combined systolic (congestive) and diastolic (congestive) heart failure: Secondary | ICD-10-CM | POA: Diagnosis present

## 2019-03-28 DIAGNOSIS — F419 Anxiety disorder, unspecified: Secondary | ICD-10-CM | POA: Diagnosis present

## 2019-03-28 DIAGNOSIS — Z8673 Personal history of transient ischemic attack (TIA), and cerebral infarction without residual deficits: Secondary | ICD-10-CM | POA: Diagnosis not present

## 2019-03-28 DIAGNOSIS — G43909 Migraine, unspecified, not intractable, without status migrainosus: Secondary | ICD-10-CM | POA: Diagnosis present

## 2019-03-28 DIAGNOSIS — Z6838 Body mass index (BMI) 38.0-38.9, adult: Secondary | ICD-10-CM

## 2019-03-28 DIAGNOSIS — Z800 Family history of malignant neoplasm of digestive organs: Secondary | ICD-10-CM | POA: Diagnosis not present

## 2019-03-28 DIAGNOSIS — K219 Gastro-esophageal reflux disease without esophagitis: Secondary | ICD-10-CM | POA: Diagnosis present

## 2019-03-28 DIAGNOSIS — G4733 Obstructive sleep apnea (adult) (pediatric): Secondary | ICD-10-CM | POA: Diagnosis present

## 2019-03-28 DIAGNOSIS — M1712 Unilateral primary osteoarthritis, left knee: Principal | ICD-10-CM | POA: Diagnosis present

## 2019-03-28 DIAGNOSIS — Z7951 Long term (current) use of inhaled steroids: Secondary | ICD-10-CM

## 2019-03-28 DIAGNOSIS — Z79899 Other long term (current) drug therapy: Secondary | ICD-10-CM

## 2019-03-28 DIAGNOSIS — D649 Anemia, unspecified: Secondary | ICD-10-CM | POA: Diagnosis present

## 2019-03-28 DIAGNOSIS — I110 Hypertensive heart disease with heart failure: Secondary | ICD-10-CM | POA: Diagnosis present

## 2019-03-28 DIAGNOSIS — R0902 Hypoxemia: Secondary | ICD-10-CM | POA: Diagnosis present

## 2019-03-28 DIAGNOSIS — Z8249 Family history of ischemic heart disease and other diseases of the circulatory system: Secondary | ICD-10-CM | POA: Diagnosis not present

## 2019-03-28 DIAGNOSIS — Z96652 Presence of left artificial knee joint: Secondary | ICD-10-CM

## 2019-03-28 DIAGNOSIS — E785 Hyperlipidemia, unspecified: Secondary | ICD-10-CM | POA: Diagnosis present

## 2019-03-28 DIAGNOSIS — Z888 Allergy status to other drugs, medicaments and biological substances status: Secondary | ICD-10-CM | POA: Diagnosis not present

## 2019-03-28 DIAGNOSIS — Z20828 Contact with and (suspected) exposure to other viral communicable diseases: Secondary | ICD-10-CM | POA: Diagnosis present

## 2019-03-28 HISTORY — PX: TOTAL KNEE ARTHROPLASTY: SHX125

## 2019-03-28 LAB — TYPE AND SCREEN
ABO/RH(D): A NEG
Antibody Screen: POSITIVE
Unit division: 0
Unit division: 0
Weak D: POSITIVE

## 2019-03-28 LAB — BPAM RBC
Blood Product Expiration Date: 202009262359
Blood Product Expiration Date: 202009262359
Unit Type and Rh: 600
Unit Type and Rh: 600

## 2019-03-28 SURGERY — ARTHROPLASTY, KNEE, TOTAL
Anesthesia: Spinal | Site: Knee | Laterality: Left

## 2019-03-28 MED ORDER — ONDANSETRON HCL 4 MG/2ML IJ SOLN
INTRAMUSCULAR | Status: AC
  Filled 2019-03-28: qty 2

## 2019-03-28 MED ORDER — PROPOFOL 500 MG/50ML IV EMUL
INTRAVENOUS | Status: DC | PRN
  Administered 2019-03-28: 75 ug/kg/min via INTRAVENOUS

## 2019-03-28 MED ORDER — BUPIVACAINE LIPOSOME 1.3 % IJ SUSP
INTRAMUSCULAR | Status: DC | PRN
  Administered 2019-03-28: 20 mL

## 2019-03-28 MED ORDER — METOPROLOL SUCCINATE ER 50 MG PO TB24
50.0000 mg | ORAL_TABLET | Freq: Every day | ORAL | Status: DC
  Administered 2019-03-28: 50 mg via ORAL
  Filled 2019-03-28: qty 1

## 2019-03-28 MED ORDER — TRANEXAMIC ACID-NACL 1000-0.7 MG/100ML-% IV SOLN
1000.0000 mg | Freq: Once | INTRAVENOUS | Status: AC
  Administered 2019-03-28: 20:00:00 1000 mg via INTRAVENOUS
  Filled 2019-03-28: qty 100

## 2019-03-28 MED ORDER — AMLODIPINE BESYLATE 10 MG PO TABS
10.0000 mg | ORAL_TABLET | Freq: Every day | ORAL | Status: DC
  Filled 2019-03-28: qty 1

## 2019-03-28 MED ORDER — CEFAZOLIN SODIUM-DEXTROSE 2-4 GM/100ML-% IV SOLN
2.0000 g | INTRAVENOUS | Status: AC
  Administered 2019-03-28: 2 g via INTRAVENOUS
  Filled 2019-03-28: qty 100

## 2019-03-28 MED ORDER — SODIUM CHLORIDE 0.9 % IR SOLN
Status: DC | PRN
  Administered 2019-03-28: 3000 mL

## 2019-03-28 MED ORDER — ONDANSETRON HCL 4 MG/2ML IJ SOLN: 4.0000 mg | Freq: Four times a day (QID) | INTRAMUSCULAR | Status: DC | PRN

## 2019-03-28 MED ORDER — HYDROMORPHONE HCL 1 MG/ML IJ SOLN
0.5000 mg | INTRAMUSCULAR | Status: DC | PRN
  Administered 2019-03-28: 1 mg via INTRAVENOUS
  Filled 2019-03-28: qty 1

## 2019-03-28 MED ORDER — OXYCODONE HCL 5 MG PO TABS
5.0000 mg | ORAL_TABLET | ORAL | Status: DC | PRN
  Administered 2019-03-28: 10 mg via ORAL
  Administered 2019-03-29: 5 mg via ORAL
  Administered 2019-03-29 (×2): 10 mg via ORAL
  Administered 2019-03-29: 5 mg via ORAL
  Filled 2019-03-28 (×2): qty 2
  Filled 2019-03-28: qty 1
  Filled 2019-03-28 (×2): qty 2
  Filled 2019-03-28: qty 1

## 2019-03-28 MED ORDER — WATER FOR IRRIGATION, STERILE IR SOLN
Status: DC | PRN
  Administered 2019-03-28 (×2): 1000 mL

## 2019-03-28 MED ORDER — DEXAMETHASONE SODIUM PHOSPHATE 10 MG/ML IJ SOLN
10.0000 mg | Freq: Once | INTRAMUSCULAR | Status: DC
  Filled 2019-03-28: qty 1

## 2019-03-28 MED ORDER — METOCLOPRAMIDE HCL 5 MG/ML IJ SOLN: 5.0000 mg | Freq: Three times a day (TID) | INTRAMUSCULAR | Status: DC | PRN

## 2019-03-28 MED ORDER — BISACODYL 5 MG PO TBEC: 5.0000 mg | DELAYED_RELEASE_TABLET | Freq: Every day | ORAL | Status: DC | PRN

## 2019-03-28 MED ORDER — POLYETHYLENE GLYCOL 3350 17 G PO PACK: 17.0000 g | PACK | Freq: Every day | ORAL | Status: DC | PRN

## 2019-03-28 MED ORDER — DEXAMETHASONE SODIUM PHOSPHATE 10 MG/ML IJ SOLN
INTRAMUSCULAR | Status: AC
  Filled 2019-03-28: qty 1

## 2019-03-28 MED ORDER — SODIUM CHLORIDE (PF) 0.9 % IJ SOLN
INTRAMUSCULAR | Status: DC | PRN
  Administered 2019-03-28: 70 mL

## 2019-03-28 MED ORDER — TRANEXAMIC ACID 1000 MG/10ML IV SOLN
INTRAVENOUS | Status: DC | PRN
  Administered 2019-03-28: 15:00:00 2000 mg via TOPICAL

## 2019-03-28 MED ORDER — TIZANIDINE HCL 2 MG PO TABS: 2.0000 mg | ORAL_TABLET | Freq: Four times a day (QID) | ORAL | 0 refills | Status: DC | PRN

## 2019-03-28 MED ORDER — FLEET ENEMA 7-19 GM/118ML RE ENEM: 1.0000 | ENEMA | Freq: Once | RECTAL | Status: DC | PRN

## 2019-03-28 MED ORDER — METOCLOPRAMIDE HCL 5 MG PO TABS: 5.0000 mg | ORAL_TABLET | Freq: Three times a day (TID) | ORAL | Status: DC | PRN

## 2019-03-28 MED ORDER — SODIUM CHLORIDE (PF) 0.9 % IJ SOLN
INTRAMUSCULAR | Status: AC
  Filled 2019-03-28: qty 20

## 2019-03-28 MED ORDER — TOPIRAMATE 100 MG PO TABS
100.0000 mg | ORAL_TABLET | Freq: Two times a day (BID) | ORAL | Status: DC
  Administered 2019-03-28 – 2019-03-29 (×2): 100 mg via ORAL
  Filled 2019-03-28 (×2): qty 1

## 2019-03-28 MED ORDER — MIDAZOLAM HCL 2 MG/2ML IJ SOLN
INTRAMUSCULAR | Status: AC
  Filled 2019-03-28: qty 2

## 2019-03-28 MED ORDER — ALUM & MAG HYDROXIDE-SIMETH 200-200-20 MG/5ML PO SUSP: 30.0000 mL | ORAL | Status: DC | PRN

## 2019-03-28 MED ORDER — HYDROMORPHONE HCL 1 MG/ML IJ SOLN: 0.2500 mg | INTRAMUSCULAR | Status: DC | PRN

## 2019-03-28 MED ORDER — PROPOFOL 10 MG/ML IV BOLUS
INTRAVENOUS | Status: DC | PRN
  Administered 2019-03-28: 40 mg via INTRAVENOUS

## 2019-03-28 MED ORDER — FENTANYL CITRATE (PF) 100 MCG/2ML IJ SOLN
50.0000 ug | INTRAMUSCULAR | Status: AC
  Administered 2019-03-28: 50 ug via INTRAVENOUS
  Filled 2019-03-28: qty 2

## 2019-03-28 MED ORDER — HYDROCHLOROTHIAZIDE 25 MG PO TABS: 25.0000 mg | ORAL_TABLET | Freq: Every day | ORAL | Status: DC | PRN

## 2019-03-28 MED ORDER — MEPERIDINE HCL 50 MG/ML IJ SOLN: 6.2500 mg | INTRAMUSCULAR | Status: DC | PRN

## 2019-03-28 MED ORDER — CLONAZEPAM 0.5 MG PO TABS
0.5000 mg | ORAL_TABLET | Freq: Every day | ORAL | Status: DC
  Administered 2019-03-28: 22:00:00 0.5 mg via ORAL
  Filled 2019-03-28: qty 1

## 2019-03-28 MED ORDER — FENTANYL CITRATE (PF) 100 MCG/2ML IJ SOLN
INTRAMUSCULAR | Status: AC
  Filled 2019-03-28: qty 2

## 2019-03-28 MED ORDER — DIPHENHYDRAMINE HCL 12.5 MG/5ML PO ELIX: 12.5000 mg | ORAL_SOLUTION | ORAL | Status: DC | PRN

## 2019-03-28 MED ORDER — MOMETASONE FURO-FORMOTEROL FUM 200-5 MCG/ACT IN AERO
2.0000 | INHALATION_SPRAY | Freq: Two times a day (BID) | RESPIRATORY_TRACT | Status: DC
  Administered 2019-03-28: 2 via RESPIRATORY_TRACT
  Filled 2019-03-28: qty 8.8

## 2019-03-28 MED ORDER — PROMETHAZINE HCL 25 MG/ML IJ SOLN: 6.2500 mg | INTRAMUSCULAR | Status: DC | PRN

## 2019-03-28 MED ORDER — TRANEXAMIC ACID-NACL 1000-0.7 MG/100ML-% IV SOLN
1000.0000 mg | INTRAVENOUS | Status: AC
  Administered 2019-03-28: 1000 mg via INTRAVENOUS
  Filled 2019-03-28: qty 100

## 2019-03-28 MED ORDER — METHOCARBAMOL 500 MG PO TABS: 500.0000 mg | ORAL_TABLET | Freq: Four times a day (QID) | ORAL | Status: DC | PRN

## 2019-03-28 MED ORDER — BUPIVACAINE-EPINEPHRINE (PF) 0.5% -1:200000 IJ SOLN
INTRAMUSCULAR | Status: AC
  Filled 2019-03-28: qty 30

## 2019-03-28 MED ORDER — SODIUM CHLORIDE 0.9 % IV SOLN
INTRAVENOUS | Status: DC | PRN
  Administered 2019-03-28: 25 ug/min via INTRAVENOUS

## 2019-03-28 MED ORDER — SODIUM CHLORIDE 0.9 % IR SOLN
Status: DC | PRN
  Administered 2019-03-28: 1000 mL

## 2019-03-28 MED ORDER — PROPOFOL 10 MG/ML IV BOLUS
INTRAVENOUS | Status: AC
  Filled 2019-03-28: qty 60

## 2019-03-28 MED ORDER — FENTANYL CITRATE (PF) 100 MCG/2ML IJ SOLN
INTRAMUSCULAR | Status: DC | PRN
  Administered 2019-03-28: 50 ug via INTRAVENOUS

## 2019-03-28 MED ORDER — ACETAMINOPHEN 325 MG PO TABS
325.0000 mg | ORAL_TABLET | Freq: Four times a day (QID) | ORAL | Status: DC | PRN
  Administered 2019-03-29: 16:00:00 650 mg via ORAL
  Filled 2019-03-28: qty 2

## 2019-03-28 MED ORDER — ONDANSETRON HCL 4 MG PO TABS: 4.0000 mg | ORAL_TABLET | Freq: Four times a day (QID) | ORAL | Status: DC | PRN

## 2019-03-28 MED ORDER — BUPIVACAINE IN DEXTROSE 0.75-8.25 % IT SOLN
INTRATHECAL | Status: DC | PRN
  Administered 2019-03-28: 1.8 mL via INTRATHECAL

## 2019-03-28 MED ORDER — DOCUSATE SODIUM 100 MG PO CAPS
100.0000 mg | ORAL_CAPSULE | Freq: Two times a day (BID) | ORAL | Status: DC
  Administered 2019-03-28 – 2019-03-29 (×2): 100 mg via ORAL
  Filled 2019-03-28 (×2): qty 1

## 2019-03-28 MED ORDER — ASPIRIN EC 81 MG PO TBEC: 81.0000 mg | DELAYED_RELEASE_TABLET | Freq: Two times a day (BID) | ORAL | 0 refills | Status: DC

## 2019-03-28 MED ORDER — MENTHOL 3 MG MT LOZG: 1.0000 | LOZENGE | OROMUCOSAL | Status: DC | PRN

## 2019-03-28 MED ORDER — SODIUM CHLORIDE (PF) 0.9 % IJ SOLN
INTRAMUSCULAR | Status: AC
  Filled 2019-03-28: qty 50

## 2019-03-28 MED ORDER — CHLORHEXIDINE GLUCONATE 4 % EX LIQD: 60.0000 mL | Freq: Once | CUTANEOUS | Status: DC

## 2019-03-28 MED ORDER — PHENYLEPHRINE HCL (PRESSORS) 10 MG/ML IV SOLN
INTRAVENOUS | Status: AC
  Filled 2019-03-28: qty 1

## 2019-03-28 MED ORDER — PHENOL 1.4 % MT LIQD
1.0000 | OROMUCOSAL | Status: DC | PRN
  Filled 2019-03-28: qty 177

## 2019-03-28 MED ORDER — METHOCARBAMOL 500 MG IVPB - SIMPLE MED
500.0000 mg | Freq: Four times a day (QID) | INTRAVENOUS | Status: DC | PRN
  Filled 2019-03-28: qty 50

## 2019-03-28 MED ORDER — MIDAZOLAM HCL 5 MG/5ML IJ SOLN
INTRAMUSCULAR | Status: DC | PRN
  Administered 2019-03-28: 1 mg via INTRAVENOUS

## 2019-03-28 MED ORDER — MIDAZOLAM HCL 2 MG/2ML IJ SOLN
1.0000 mg | INTRAMUSCULAR | Status: AC
  Administered 2019-03-28: 13:00:00 2 mg via INTRAVENOUS
  Filled 2019-03-28: qty 2

## 2019-03-28 MED ORDER — ONDANSETRON HCL 4 MG/2ML IJ SOLN
INTRAMUSCULAR | Status: DC | PRN
  Administered 2019-03-28: 4 mg via INTRAVENOUS

## 2019-03-28 MED ORDER — DEXAMETHASONE SODIUM PHOSPHATE 10 MG/ML IJ SOLN
INTRAMUSCULAR | Status: DC | PRN
  Administered 2019-03-28: 10 mg via INTRAVENOUS

## 2019-03-28 MED ORDER — ASPIRIN 81 MG PO CHEW
81.0000 mg | CHEWABLE_TABLET | Freq: Two times a day (BID) | ORAL | Status: DC
  Administered 2019-03-28 – 2019-03-29 (×2): 81 mg via ORAL
  Filled 2019-03-28 (×2): qty 1

## 2019-03-28 MED ORDER — PANTOPRAZOLE SODIUM 40 MG PO TBEC
40.0000 mg | DELAYED_RELEASE_TABLET | Freq: Every day | ORAL | Status: DC
  Administered 2019-03-28 – 2019-03-29 (×2): 40 mg via ORAL
  Filled 2019-03-28 (×2): qty 1

## 2019-03-28 MED ORDER — OXYCODONE-ACETAMINOPHEN 5-325 MG PO TABS: 1.0000 | ORAL_TABLET | ORAL | 0 refills | Status: DC | PRN

## 2019-03-28 MED ORDER — BUPIVACAINE-EPINEPHRINE (PF) 0.5% -1:200000 IJ SOLN
INTRAMUSCULAR | Status: DC | PRN
  Administered 2019-03-28: 15 mL

## 2019-03-28 MED ORDER — POVIDONE-IODINE 10 % EX SWAB
2.0000 "application " | Freq: Once | CUTANEOUS | Status: AC
  Administered 2019-03-28: 2 via TOPICAL

## 2019-03-28 MED ORDER — LACTATED RINGERS IV SOLN
INTRAVENOUS | Status: DC
  Administered 2019-03-28 (×3): via INTRAVENOUS

## 2019-03-28 MED ORDER — GABAPENTIN 300 MG PO CAPS
300.0000 mg | ORAL_CAPSULE | Freq: Three times a day (TID) | ORAL | Status: DC
  Administered 2019-03-28 – 2019-03-29 (×3): 300 mg via ORAL
  Filled 2019-03-28 (×3): qty 1

## 2019-03-28 MED ORDER — KCL IN DEXTROSE-NACL 20-5-0.45 MEQ/L-%-% IV SOLN
INTRAVENOUS | Status: DC
  Administered 2019-03-28: 20:00:00 via INTRAVENOUS
  Filled 2019-03-28 (×2): qty 1000

## 2019-03-28 MED ORDER — TRAZODONE HCL 50 MG PO TABS: 50.0000 mg | ORAL_TABLET | Freq: Every evening | ORAL | Status: DC | PRN

## 2019-03-28 SURGICAL SUPPLY — 50 items
ATTUNE PS FEM LT SZ 7 CEM KNEE (Femur) ×2 IMPLANT
ATTUNE PSRP INSR SZ7 5 KNEE (Insert) ×1 IMPLANT
ATTUNE PSRP INSR SZ7 5MM KNEE (Insert) ×1 IMPLANT
BAG DECANTER FOR FLEXI CONT (MISCELLANEOUS) ×3 IMPLANT
BAG ZIPLOCK 12X15 (MISCELLANEOUS) ×3 IMPLANT
BASE TIBIA ATTUNE KNEE SYS SZ6 (Knees) IMPLANT
BLADE SAG 18X100X1.27 (BLADE) ×3 IMPLANT
BLADE SAW SGTL 11.0X1.19X90.0M (BLADE) ×3 IMPLANT
BLADE SURG SZ10 CARB STEEL (BLADE) ×6 IMPLANT
BNDG ELASTIC 6X10 VLCR STRL LF (GAUZE/BANDAGES/DRESSINGS) ×3 IMPLANT
BOWL SMART MIX CTS (DISPOSABLE) ×3 IMPLANT
CEMENT HV SMART SET (Cement) ×6 IMPLANT
COVER SURGICAL LIGHT HANDLE (MISCELLANEOUS) ×3 IMPLANT
COVER WAND RF STERILE (DRAPES) IMPLANT
CUFF TOURN SGL QUICK 34 (TOURNIQUET CUFF) ×2
CUFF TRNQT CYL 34X4.125X (TOURNIQUET CUFF) ×1 IMPLANT
DECANTER SPIKE VIAL GLASS SM (MISCELLANEOUS) ×9 IMPLANT
DRAPE U-SHAPE 47X51 STRL (DRAPES) ×3 IMPLANT
DRSG AQUACEL AG ADV 3.5X10 (GAUZE/BANDAGES/DRESSINGS) ×3 IMPLANT
DURAPREP 26ML APPLICATOR (WOUND CARE) ×3 IMPLANT
ELECT REM PT RETURN 15FT ADLT (MISCELLANEOUS) ×3 IMPLANT
GLOVE BIO SURGEON STRL SZ7.5 (GLOVE) ×3 IMPLANT
GLOVE BIO SURGEON STRL SZ8.5 (GLOVE) ×3 IMPLANT
GLOVE BIOGEL PI IND STRL 8 (GLOVE) ×1 IMPLANT
GLOVE BIOGEL PI IND STRL 9 (GLOVE) ×1 IMPLANT
GLOVE BIOGEL PI INDICATOR 8 (GLOVE) ×2
GLOVE BIOGEL PI INDICATOR 9 (GLOVE) ×2
GOWN STRL REUS W/TWL XL LVL3 (GOWN DISPOSABLE) ×6 IMPLANT
HANDPIECE INTERPULSE COAX TIP (DISPOSABLE) ×2
HOOD PEEL AWAY FLYTE STAYCOOL (MISCELLANEOUS) ×13 IMPLANT
KIT TURNOVER KIT A (KITS) IMPLANT
NDL HYPO 21X1.5 SAFETY (NEEDLE) ×2 IMPLANT
NEEDLE HYPO 21X1.5 SAFETY (NEEDLE) ×6 IMPLANT
NS IRRIG 1000ML POUR BTL (IV SOLUTION) ×3 IMPLANT
PACK ICE MAXI GEL EZY WRAP (MISCELLANEOUS) ×3 IMPLANT
PACK TOTAL KNEE CUSTOM (KITS) ×3 IMPLANT
PATELLA MEDIAL ATTUN 35MM KNEE (Knees) ×2 IMPLANT
PIN STEINMAN FIXATION KNEE (PIN) ×2 IMPLANT
PIN THREADED HEADED SIGMA (PIN) ×2 IMPLANT
PROTECTOR NERVE ULNAR (MISCELLANEOUS) ×3 IMPLANT
SET HNDPC FAN SPRY TIP SCT (DISPOSABLE) ×1 IMPLANT
SUT VIC AB 1 CTX 36 (SUTURE) ×2
SUT VIC AB 1 CTX36XBRD ANBCTR (SUTURE) ×1 IMPLANT
SUT VIC AB 3-0 CT1 27 (SUTURE) ×6
SUT VIC AB 3-0 CT1 TAPERPNT 27 (SUTURE) ×3 IMPLANT
SYR CONTROL 10ML LL (SYRINGE) ×6 IMPLANT
TIBIA ATTUNE KNEE SYS BASE SZ6 (Knees) ×3 IMPLANT
TRAY FOLEY MTR SLVR 16FR STAT (SET/KITS/TRAYS/PACK) ×3 IMPLANT
WATER STERILE IRR 1000ML POUR (IV SOLUTION) ×6 IMPLANT
YANKAUER SUCT BULB TIP 10FT TU (MISCELLANEOUS) ×3 IMPLANT

## 2019-03-28 NOTE — Progress Notes (Signed)
PT Cancellation Note  Patient Details Name: Ann [name] MRN: [id number] DOB: [birth year]   Cancelled Treatment:    Reason Eval/Treat Not Completed: Other (comment); pt spinal and block wearing off however she declined d/t pain and fatigue. Will see in am   [facility] 03/28/2019, 6:40 PM

## 2019-03-28 NOTE — Anesthesia Procedure Notes (Addendum)
Procedure Name: MAC Date/Time: 03/28/2019 2:10 PM Performed by: Lissa Morales, CRNA Pre-anesthesia Checklist: Patient identified, Emergency Drugs available, Suction available, Patient being monitored and Timeout performed Patient Re-evaluated:Patient Re-evaluated prior to induction Oxygen Delivery Method: Simple face mask Placement Confirmation: positive ETCO2

## 2019-03-28 NOTE — Anesthesia Preprocedure Evaluation (Addendum)
Anesthesia Evaluation  Patient identified by MRN, date of birth, ID band Patient awake    Reviewed: Allergy & Precautions, NPO status , Patient's Chart, lab work & pertinent test results, reviewed documented beta blocker date and time   History of Anesthesia Complications (+) PROLONGED EMERGENCE, Family history of anesthesia reaction and history of anesthetic complications  Airway Mallampati: II  TM Distance: <3 FB Neck ROM: Full    Dental  (+) Poor Dentition, Missing   Pulmonary shortness of breath, with exertion and Long-Term Oxygen Therapy, asthma , sleep apnea and Continuous Positive Airway Pressure Ventilation ,    breath sounds clear to auscultation       Cardiovascular hypertension, Pt. on home beta blockers and Pt. on medications +CHF   Rhythm:Regular Rate:Normal  Echo 01/2017 - Left ventricle: The cavity size was normal. Wall thickness was increased in a pattern of mild LVH. Systolic function was mildly reduced. The estimated ejection fraction was in the range of 45% to 50%. Diffuse hypokinesis. Doppler parameters are consistent with abnormal left ventricular relaxation (grade 1 diastolic dysfunction).  Impressions: - Mild global reduction in LV systolic function; mild diastolic   dysfunction; trace MR and TR.   Neuro/Psych  Headaches, Anxiety TIACVA    GI/Hepatic hiatal hernia, GERD  ,  Endo/Other  Morbid obesity  Renal/GU Renal disease     Musculoskeletal  (+) Arthritis ,   Abdominal   Peds  Hematology  (+) anemia ,   Anesthesia Other Findings   Reproductive/Obstetrics                             Anesthesia Physical Anesthesia Plan  ASA: III  Anesthesia Plan: Spinal   Post-op Pain Management:  Regional for Post-op pain   Induction: Intravenous  PONV Risk Score and Plan: 2 and Propofol infusion and Ondansetron  Airway Management Planned: Natural Airway  Additional  Equipment: None  Intra-op Plan:   Post-operative Plan:   Informed Consent: I have reviewed the patients History and Physical, chart, labs and discussed the procedure including the risks, benefits and alternatives for the proposed anesthesia with the patient or authorized representative who has indicated his/her understanding and acceptance.     Dental advisory given  Plan Discussed with: CRNA  Anesthesia Plan Comments:        Anesthesia Quick Evaluation

## 2019-03-28 NOTE — Anesthesia Procedure Notes (Signed)
Spinal  Patient location during procedure: OR End time: 03/28/2019 2:20 PM Staffing Resident/CRNA: Lissa Morales, CRNA Preanesthetic Checklist Completed: patient identified, site marked, surgical consent, pre-op evaluation, timeout performed, IV checked, risks and benefits discussed and monitors and equipment checked Spinal Block Patient position: sitting Prep: DuraPrep Patient monitoring: heart rate, continuous pulse ox and blood pressure Approach: midline Location: L3-4 Injection technique: single-shot Needle Needle type: Pencan  Needle gauge: 24 G Needle length: 9 cm Assessment Sensory level: T4 Additional Notes Pt tolerated procedure well .

## 2019-03-28 NOTE — Progress Notes (Signed)
Assisted Dr. Lissa Hoard with left, ultrasound guided, adductor canal block. Side rails up, monitors on throughout procedure. See vital signs in flow sheet. Tolerated Procedure well.

## 2019-03-28 NOTE — Discharge Instructions (Signed)

## 2019-03-28 NOTE — Interval H&P Note (Signed)
History and Physical Interval Note:  03/28/2019 12:13 PM  Ann [name]  has presented today for surgery, with the diagnosis of LEFT KNEE OSTEOARTHRITIS.  The various methods of treatment have been discussed with the patient and family. After consideration of risks, benefits and other options for treatment, the patient has consented to  Procedure(s): LEFT TOTAL KNEE ARTHROPLASTY (Left) as a surgical intervention.  The patient's history has been reviewed, patient examined, no change in status, stable for surgery.  I have reviewed the patient's chart and labs.  Questions were answered to the patient's satisfaction.     [name]

## 2019-03-28 NOTE — Op Note (Signed)
PATIENT ID:      Ann [name]  MRN:     [id number] DOB/AGE:    Sep 30, 1955 / 63 y.o.       OPERATIVE REPORT   DATE OF PROCEDURE:  03/28/2019      PREOPERATIVE DIAGNOSIS:   LEFT KNEE OSTEOARTHRITIS      Estimated body mass index is 38.24 kg/m as calculated from the following:   Height as of this encounter: 5' 2.5" (1.588 m).   Weight as of this encounter: 96.4 kg.                                                       POSTOPERATIVE DIAGNOSIS:   LEFT KNEE OSTEOARTHRITIS                                                                       PROCEDURE:  Procedure(s): LEFT TOTAL KNEE ARTHROPLASTY Using DepuyAttune RP implants #7L Femur, #6Tibia, 5 mm Attune RP bearing, 35 Patella    SURGEON: [name]  ASSISTANT:   Kerry Hough. [employer]   (Present and scrubbed throughout the case, critical for assistance with exposure, retraction, instrumentation, and closure.)        ANESTHESIA: Spinal, 20cc Exparel, 50cc 0.25% Marcaine EBL: 400 cc FLUID REPLACEMENT: 1600 cc crystaloid TOURNIQUET: DRAINS: None TRANEXAMIC ACID: 1gm IV, 2gm topical COMPLICATIONS:  None         INDICATIONS FOR PROCEDURE: The patient has  LEFT KNEE OSTEOARTHRITIS, VAL deformities, XR shows bone on bone arthritis, lateral subluxation of tibia. Patient has failed all conservative measures including anti-inflammatory medicines, narcotics, attempts at exercise and weight loss, cortisone injections and viscosupplementation.  Risks and benefits of surgery have been discussed, questions answered.   DESCRIPTION OF PROCEDURE: The patient identified by armband, received  IV antibiotics, in the holding area at [facility]. Patient taken to the operating room, appropriate anesthetic monitors were attached, and   anesthesia was  induced. IV Tranexamic acid was given.Tourniquet applied high to the operative thigh. Lateral post and foot positioner applied to the table, the lower extremity was then prepped and draped in usual  sterile fashion from the toes to the tourniquet. Time-out procedure was performed. The skin and subcutaneous tissue along the incision was injected with 20 cc of a mixture of Exparel and Marcaine solution, using a 20-gauge by 1-1/2 inch needle. We began the operation, with the knee flexed 130 degrees, by making the anterior midline incision starting at handbreadth above the patella going over the patella 1 cm medial to and 4 cm distal to the tibial tubercle. Small bleeders in the skin and the subcutaneous tissue identified and cauterized. Transverse retinaculum was incised and reflected medially and a medial parapatellar arthrotomy was accomplished. the patella was everted and theprepatellar fat pad resected. The superficial medial collateral ligament was then elevated from anterior to posterior along the proximal flare of the tibia and anterior half of the menisci resected. The knee was hyperflexed exposing bone on bone arthritis. Peripheral and notch osteophytes as well as the cruciate ligaments were then resected.  We continued to work our way around posteriorly along the proximal tibia, and externally rotated the tibia subluxing it out from underneath the femur. A McHale PCL retractor was placed through the notch and a lateral Hohmann retractor placed, and we then entered the proximal tibia in line with the Depuy starter drill in line with the axis of the tibia followed by an intramedullary guide rod and 0-degree posterior slope cutting guide. The tibial cutting guide, 4 degree posterior sloped, was pinned into place allowing resection of 12 mm of bone medially and 6 mm of bone laterally. Satisfied with the tibial resection, we then entered the distal femur 2 mm anterior to the PCL origin with the intramedullary guide rod and applied the distal femoral cutting guide set at 9 mm, with 5 degrees of valgus. This was pinned along the epicondylar axis. At this point, the distal femoral cut was accomplished without  difficulty. We then sized for a #7L femoral component and pinned the guide in 0 degrees of external rotation. The chamfer cutting guide was pinned into place. The anterior, posterior, and chamfer cuts were accomplished without difficulty followed by the Attune RP box cutting guide and the box cut. We also removed posterior osteophytes from the posterior femoral condyles. The posterior capsule was injected with Exparel solution. The knee was brought into full extension. We checked our extension gap and fit a 5 mm bearing. Distracting in extension with a lamina spreader,  bleeders in the posterior capsule, Posterior medial and posterior lateral gutter were cauterized.  The transexamic acid-soaked sponge was then placed in the gap of the knee in extension. The knee was flexed 30. The posterior patella cut was accomplished with the 9.5 mm Attune cutting guide, sized for a 35mm dome, and the fixation pegs drilled.The knee was then once again hyperflexed exposing the proximal tibia. We sized for a # 6 tibial base plate, applied the smokestack and the conical reamer followed by the the Delta fin keel punch. We then hammered into place the Attune RP trial femoral component, drilled the lugs, inserted a  5 mm trial bearing, trial patellar button, and took the knee through range of motion from 0-130 degrees. Medial and lateral ligamentous stability was checked. No thumb pressure was required for patellar Tracking. The tourniquet was used for 10 min at 332mm hg. All trial components were removed, mating surfaces irrigated with pulse lavage, and dried with suction and sponges. 10 cc of the Exparel solution was applied to the cancellus bone of the patella distal femur and proximal tibia.  After waiting 30 seconds, the bony surfaces were again, dried with sponges. A double batch of DePuy HV cement was mixed and applied to all bony metallic mating surfaces except for the posterior condyles of the femur itself. In order, we  hammered into place the tibial tray and removed excess cement, the femoral component and removed excess cement. The final Attune RP bearing was inserted, and the knee brought to full extension with compression. The patellar button was clamped into place, and excess cement removed. The knee was held at 30 flexion with compression, while the cement cured. The wound was irrigated out with normal saline solution pulse lavage. The rest of the Exparel was injected into the parapatellar arthrotomy, subcutaneous tissues, and periosteal tissues. The parapatellar arthrotomy was closed with running #1 Vicryl suture. The subcutaneous tissue with 0 and 2-0 undyed Vicryl suture, and the skin with running 3-0 SQ vicryl. An Aquacil and Ace wrap were applied. The  patient was taken to recovery room without difficulty.   [name] 03/28/2019, 4:03 PM

## 2019-03-28 NOTE — Transfer of Care (Signed)
Immediate Anesthesia Transfer of Care Note  Patient: Ann [name]  Procedure(s) Performed: LEFT TOTAL KNEE ARTHROPLASTY (Left Knee)  Patient Location: PACU  Anesthesia Type:Spinal  Level of Consciousness: awake, alert , oriented and patient cooperative  Airway & Oxygen Therapy: Patient Spontanous Breathing and Patient connected to face mask oxygen  Post-op Assessment: Report given to RN and Post -op Vital signs reviewed and stable  Post vital signs: stable  Last Vitals:  Vitals Value Taken Time  BP 108/61 03/28/19 1610  Temp    Pulse 52 03/28/19 1614  Resp 14 03/28/19 1614  SpO2 97 % 03/28/19 1614  Vitals shown include unvalidated device data.  Last Pain:  Vitals:   03/28/19 1351  TempSrc:   PainSc: 0-No pain         Complications: No apparent anesthesia complications

## 2019-03-28 NOTE — Anesthesia Procedure Notes (Signed)
Anesthesia Regional Block: Adductor canal block   Pre-Anesthetic Checklist: ,, timeout performed, Correct Patient, Correct Site, Correct Laterality, Correct Procedure, Correct Position, site marked, Risks and benefits discussed,  Surgical consent,  Pre-op evaluation,  At surgeon's request and post-op pain management  Laterality: Left  Prep: chloraprep       Needles:  Injection technique: Single-shot  Needle Type: Stimiplex     Needle Length: 9cm  Needle Gauge: 21     Additional Needles:   Procedures:,,,, ultrasound used (permanent image in chart),,,,  Narrative:  Start time: 03/28/2019 1:20 PM End time: 03/28/2019 1:26 PM Injection made incrementally with aspirations every 5 mL.  Performed by: Personally  Anesthesiologist: Nolon Nations, MD  Additional Notes: BP cuff, EKG monitors applied. Sedation begun. Artery and nerve location verified with U/S and anesthetic injected incrementally, slowly, and after negative aspirations under direct u/s guidance. Good fascial /perineural spread. Tolerated well.

## 2019-03-29 ENCOUNTER — Encounter (HOSPITAL_COMMUNITY): Payer: Self-pay | Admitting: Orthopedic Surgery

## 2019-03-29 LAB — CBC
HCT: 39.9 % (ref 36.0–46.0)
Hemoglobin: 12.6 g/dL (ref 12.0–15.0)
MCH: 32 pg (ref 26.0–34.0)
MCHC: 31.6 g/dL (ref 30.0–36.0)
MCV: 101.3 fL — ABNORMAL HIGH (ref 80.0–100.0)
Platelets: 236 10*3/uL (ref 150–400)
RBC: 3.94 MIL/uL (ref 3.87–5.11)
RDW: 14 % (ref 11.5–15.5)
WBC: 9 10*3/uL (ref 4.0–10.5)
nRBC: 0 % (ref 0.0–0.2)

## 2019-03-29 LAB — BASIC METABOLIC PANEL
Anion gap: 7 (ref 5–15)
BUN: 17 mg/dL (ref 6–20)
CO2: 23 mmol/L (ref 22–32)
Calcium: 8.2 mg/dL — ABNORMAL LOW (ref 8.9–10.3)
Chloride: 108 mmol/L (ref 98–111)
Creatinine, Ser: 0.98 mg/dL (ref 0.44–1.00)
GFR calc Af Amer: >60 mL/min (ref >60)
GFR calc non Af Amer: >60 mL/min (ref >60)
Glucose, Bld: 183 mg/dL — ABNORMAL HIGH (ref 70–99)
Potassium: 4.5 mmol/L (ref 3.5–5.1)
Sodium: 138 mmol/L (ref 135–145)

## 2019-03-29 NOTE — TOC Transition Note (Addendum)
Transition of Care [facility]) - CM/SW Discharge Note   Patient Details  Name: CHAMYA KURTA MRN: [id number] Date of Birth: 03/30/1956  Transition of Care [facility]) CM/SW Contact:  Lia Hopping, [place] Phone Number: 03/29/2019, 2:19 PM   Clinical Narrative:  Home Health options presented the patient.  [facility] arranged [facility].  Patient purchased a walker in the last five years and no longer has the RW. Patient insurance will not cover the cost for a new RW. Patient declined a RW through [place] inform rep it was too expensive. CSW inform the patient she will need a walker to safely discharge home.   Patient daughter will bring rolator walker from home,and the patient plans to purchase her own RW this evening or tomorrow.   Final next level of care: [place] Barriers to Discharge: No Barriers Identified   Patient Goals and CMS Choice Patient states their goals for this hospitalization and ongoing recovery are:: "to work with therapy so I can have my other sugery in the next 6 weeks." CMS Medicare.gov Compare Post Acute Care list provided to:: Patient Choice offered to / list presented to : Patient  Discharge Placement                        Discharge Plan and Services                            [place]: [facility] Date [place]: 03/29/19 Time [place]: 1030 Representative spoke with at [place]: [place] ([place]) Interventions     Readmission Risk Interventions No flowsheet data found.

## 2019-03-29 NOTE — Discharge Summary (Signed)
Patient ID: Ann [name] MRN: [id number] DOB/AGE: 1956/03/30 63 y.o.  Admit date: 03/28/2019 Discharge date: 03/29/2019  Admission Diagnoses:  Principal Problem:   Degenerative arthritis of left knee Active Problems:   S/P total knee arthroplasty, left   Discharge Diagnoses:  Same  Past Medical History:  Diagnosis Date  . Adenoma of colon   . Allergy    seasonal  . Anemia    with past pregnancy -not recent  . Anxiety   . Arthritis   . Borderline diabetes    diet controlled - no meds  . Chronic cystitis   . Complication of anesthesia    02-20-2014 ([facility]) INTRA-OP RESPIRATORY FAILURE SECONDARY TO POSSIBLE MUCOUS ASPIRATION/  ALSO 06-06-2013 [facility]) POST-OP DESATURATION  EVEN USING CPAP, States some issues if Propofol given to rapidly.  . Diverticulitis   . Diverticulosis of colon   . Dyspnea    only needs O2 at Night  . Family history of adverse reaction to anesthesia    PER PT SISTER DIED AFTER GENERAL ANESTHESIA DUE TO UNDIAGNOSED OSA  . GERD (gastroesophageal reflux disease)   . H/O hiatal hernia   . Headache(784.0)    migraines, decreased since turning 50's  . History of chronic cough    "tight sounding cough"  . History of esophageal dilatation   . History of kidney stones   . History of MRSA infection    3/ 2015  AXILLARY ABSCESS  . History of recurrent UTIs   . History of TIAs NO RESIDUAL   1980;  2005;   2008 PT STATES PER CT  SCARRING RIGHT SIDE OF BRAIN  . Hyperlipidemia   . Hypertension   . Kidney disease    III  . Neuromuscular disorder ([place])    HH  . Neuropathy    pt denies this 07-05-2015  . Nocturia   . OSA on CPAP    PER SLEEP STUDY 09-08-2012  MODERATE OSA. not used in awhile, but thinks needs to start back using due to some weight gain.  . Osteoarthritis of knee    Right  . Sleep apnea    uses CPAP most nights  . Stroke [facility]) [phone]   TIA'S slight weakness on lt leg  . SUI (stress urinary incontinence, female)   . SVD  (spontaneous vaginal delivery)    x 3    Surgeries: Procedure(s): LEFT TOTAL KNEE ARTHROPLASTY on 03/28/2019   Consultants:   Discharged Condition: Improved  Hospital Course: Ann [name] is an 63 y.o. female who was admitted 03/28/2019 for operative treatment ofDegenerative arthritis of left knee. Patient has severe unremitting pain that affects sleep, daily activities, and work/hobbies. After pre-op clearance the patient was taken to the operating room on 03/28/2019 and underwent  Procedure(s): LEFT TOTAL KNEE ARTHROPLASTY.    Patient was given perioperative antibiotics:  Anti-infectives (From admission, onward)   Start     Dose/Rate Route Frequency Ordered Stop   03/28/19 1130  ceFAZolin (ANCEF) IVPB 2g/100 mL premix     2 g 200 mL/hr over 30 Minutes Intravenous On call to O.R. 03/28/19 1124 03/28/19 1412       Patient was given sequential compression devices, early ambulation, and chemoprophylaxis to prevent DVT.  Patient benefited maximally from hospital stay and there were no complications.    Recent vital signs:  Patient Vitals for the past 24 hrs:  BP Temp Temp src Pulse Resp SpO2  03/29/19 1325 121/77 97.6 F (36.4 C) - (!) 57 15 95 %  03/29/19 0936 100/63 97.9 F (36.6 C) Oral (!) 56 16 95 %  03/29/19 0434 103/64 97.7 F (36.5 C) Oral (!) 55 16 97 %  03/29/19 0144 123/64 97.6 F (36.4 C) Oral (!) 55 14 97 %  03/28/19 2017 118/68 97.8 F (36.6 C) Oral (!) 57 16 95 %  03/28/19 2004 - - - - - 96 %  03/28/19 1917 131/73 (!) 97.5 F (36.4 C) Oral (!) 55 20 98 %  03/28/19 1753 118/68 98.2 F (36.8 C) Oral (!) 50 13 99 %  03/28/19 1730 117/65 98.7 F (37.1 C) - (!) 51 12 97 %  03/28/19 1700 112/66 - - (!) 51 14 99 %  03/28/19 1645 118/74 98.7 F (37.1 C) - (!) 58 14 98 %  03/28/19 1630 107/69 - - (!) 51 12 99 %  03/28/19 1615 98/79 - - (!) 52 13 98 %  03/28/19 1610 108/61 97.7 F (36.5 C) - (!) 54 (!) 8 100 %     Recent laboratory studies:  Recent Labs     03/29/19 0241  WBC 9.0  HGB 12.6  HCT 39.9  PLT 236  NA 138  K 4.5  CL 108  CO2 23  BUN 17  CREATININE 0.98  GLUCOSE 183*  CALCIUM 8.2*     Discharge Medications:   Allergies as of 03/29/2019      Reactions   Lisinopril Cough   Lidocaine    Increase blood pressure   Doxycycline Diarrhea   Severe headaches   Metronidazole Other (See Comments)   Severe headaches BUT CAN BE TOLERATED      Medication List    STOP taking these medications   traMADol 50 MG tablet Commonly known as: ULTRAM     TAKE these medications   amLODipine 10 MG tablet Commonly known as: NORVASC Take 10 mg by mouth daily.   aspirin EC 81 MG tablet Take 1 tablet (81 mg total) by mouth 2 (two) times daily.   atorvastatin 10 MG tablet Commonly known as: LIPITOR Take 10 mg by mouth at bedtime.   chlorpheniramine 4 MG tablet Commonly known as: CHLOR-TRIMETON Take 1 tablet (4 mg total) by mouth at bedtime. What changed: additional instructions   clonazePAM 0.5 MG tablet Commonly known as: KLONOPIN Take 0.5 mg by mouth at bedtime.   hydrochlorothiazide 25 MG tablet Commonly known as: HYDRODIURIL Take 25 mg by mouth daily as needed (swelling).   metoprolol succinate 50 MG 24 hr tablet Commonly known as: TOPROL-XL Take 50 mg by mouth at bedtime.   omeprazole 40 MG capsule Commonly known as: PRILOSEC TAKE 1 CAPSULE(40 MG) BY MOUTH TWICE DAILY What changed: See the new instructions.   oxyCODONE-acetaminophen 5-325 MG tablet Commonly known as: PERCOCET/ROXICET Take 1 tablet by mouth every 4 (four) hours as needed for severe pain.   Symbicort 160-4.5 MCG/ACT inhaler Generic drug: budesonide-formoterol Inhale 2 puffs into the lungs 2 (two) times daily as needed (Shortness of breath).   tiZANidine 2 MG tablet Commonly known as: ZANAFLEX Take 1 tablet (2 mg total) by mouth every 6 (six) hours as needed.   topiramate 100 MG tablet Commonly known as: TOPAMAX Take 1 tablet (100 mg total)  by mouth 2 (two) times daily.   traZODone 50 MG tablet Commonly known as: DESYREL Take 50 mg by mouth at bedtime as needed for sleep.            Durable Medical Equipment  (From admission, onward)  Start     Ordered   03/28/19 1804  DME Walker rolling  Once    Question:  Patient needs a walker to treat with the following condition  Answer:  Status post total left knee replacement   03/28/19 1803   03/28/19 1804  DME 3 n 1  Once     03/28/19 1803           Discharge Care Instructions  (From admission, onward)         Start     Ordered   03/29/19 0000  Weight bearing as tolerated     03/29/19 1409          Diagnostic Studies: Dg Chest 2 View  Result Date: 03/22/2019 CLINICAL DATA:  preop knee surgery for next week, hx chf. EXAM: CHEST - 2 VIEW COMPARISON:  Chest radiograph 08/09/2018, 05/27/2018 FINDINGS: Stable cardiomediastinal contours with heart size at the upper limits of normal. Central vascular congestion. No overt edema or new focal infiltrate. No pneumothorax or pleural effusion. Degenerative changes are seen in the thoracic spine. IMPRESSION: No acute cardiopulmonary process. Electronically Signed   By: Audie Pinto M.D.   On: 03/22/2019 14:18    Disposition: Discharge disposition: 01-Home or Self Care       Discharge Instructions    Call MD / Call 911   Complete by: As directed    If you experience chest pain or shortness of breath, CALL 911 and be transported to the hospital emergency room.  If you develope a fever above 101 F, pus (white drainage) or increased drainage or redness at the wound, or calf pain, call your surgeon's office.   Constipation Prevention   Complete by: As directed    Drink plenty of fluids.  Prune juice may be helpful.  You may use a stool softener, such as Colace (over the counter) 100 mg twice a day.  Use MiraLax (over the counter) for constipation as needed.   Diet - low sodium heart healthy   Complete by: As  directed    Driving restrictions   Complete by: As directed    No driving for 2 weeks   Increase activity slowly as tolerated   Complete by: As directed    Patient may shower   Complete by: As directed    You may shower without a dressing once there is no drainage.  Do not wash over the wound.  If drainage remains, cover wound with plastic wrap and then shower.   Weight bearing as tolerated   Complete by: As directed       Follow-up Information    Frederik Pear, MD In 2 weeks.   Specialty: Orthopedic Surgery Contact information: [place] [address] [phone]            Signed: Joanell Rising 03/29/2019, 2:10 PM

## 2019-03-29 NOTE — Progress Notes (Signed)
Physical Therapy Treatment Patient Details Name: Ann [name] MRN: [id number] DOB: Jul 06, 1956 Today's Date: 03/29/2019    History of Present Illness s/p L TKA. PMH: OSA, HTN, obesity    PT Comments    Pt and RN reports d/c plan is for home.  Pt reports her daughter will stay with her for a couple days upon d/c.  Pt performed LE exercises and provided with HEP.  Pt has a ramp to enter home.  Pt ambulated good distance in hallway and feels ready for d/c home today.   Follow Up Recommendations  Follow surgeon's recommendation for DC plan and follow-up therapies;Home health PT     Equipment Recommendations  Rolling walker with 5" wheels    Recommendations for Other Services       Precautions / Restrictions Precautions Precautions: Fall;Knee Restrictions Other Position/Activity Restrictions: WBAT    Mobility  Bed Mobility Overal bed mobility: Needs Assistance Bed Mobility: Supine to Sit;Sit to Supine     Supine to sit: Supervision Sit to supine: Supervision   General bed mobility comments: assist for L LE  Transfers Overall transfer level: Needs assistance Equipment used: Rolling walker (2 wheeled) Transfers: Sit to/from Stand Sit to Stand: Min guard         General transfer comment: verbal cues for UE and LE positioning  Ambulation/Gait Ambulation/Gait assistance: Min guard Gait Distance (Feet): 140 Feet Assistive device: Rolling walker (2 wheeled) Gait Pattern/deviations: Step-to pattern;Decreased stance time - left;Antalgic     General Gait Details: verbal cues for sequence, RW positioning, step length, posture   Stairs             Wheelchair Mobility    Modified Rankin (Stroke Patients Only)       Balance                                            Cognition Arousal/Alertness: Awake/alert Behavior During Therapy: WFL for tasks assessed/performed Overall Cognitive Status: Within Functional Limits for tasks assessed                                         Exercises Total Joint Exercises Ankle Circles/Pumps: AROM;Both;10 reps Quad Sets: AROM;10 reps;Both Short Arc Quad: AROM;Left;10 reps Heel Slides: AAROM;Left;10 reps Hip ABduction/ADduction: Left;10 reps;AAROM Straight Leg Raises: AAROM;Left;10 reps Goniometric ROM: approx 55* AAROM L knee flexion, lacking approx 9* knee extension    General Comments        Pertinent Vitals/Pain Pain Assessment: 0-10 Pain Score: 7  Pain Location: L knee Pain Descriptors / Indicators: Aching;Sore Pain Intervention(s): Limited activity within patient's tolerance;Monitored during session;Repositioned    Home Living Family/patient expects to be discharged to:: Skilled nursing facility Living Arrangements: Alone   Type of Home: House Home Access: Ramped entrance   Home Layout: One level Home Equipment: Wheelchair - manual Additional Comments: plans to d/c to SNF    Prior Function Level of Independence: Independent          PT Goals (current goals can now be found in the care plan section) Acute Rehab PT Goals PT Goal Formulation: With patient Time For Goal Achievement: 04/02/19 Potential to Achieve Goals: Good Progress towards PT goals: Progressing toward goals    Frequency    7X/week      PT  Plan Current plan remains appropriate    Co-evaluation              AM-PAC PT "6 Clicks" Mobility   Outcome Measure  Help needed turning from your back to your side while in a flat bed without using bedrails?: A Little Help needed moving from lying on your back to sitting on the side of a flat bed without using bedrails?: A Little Help needed moving to and from a bed to a chair (including a wheelchair)?: A Little Help needed standing up from a chair using your arms (e.g., wheelchair or bedside chair)?: A Little Help needed to walk in hospital room?: A Little Help needed climbing 3-5 steps with a railing? : A Little 6 Click  Score: 18    End of Session Equipment Utilized During Treatment: Gait belt Activity Tolerance: Patient tolerated treatment well Patient left: with call bell/phone within reach;in bed   PT Visit Diagnosis: Other abnormalities of gait and mobility (R26.89)     Time: [id number] PT Time Calculation (min) (ACUTE ONLY): 26 min  Charges:  $Gait Training: 8-22 mins $Therapeutic Exercise: 8-22 mins                     Carmelia Bake, PT, DPT Acute Rehabilitation Services Office: [phone] Pager: [phone]  Trena Platt 03/29/2019, 4:44 PM

## 2019-03-29 NOTE — Progress Notes (Signed)
Patient's IV infiltrated. IV removed and warm compress placed on patient's hand. Patient says this is common for her. Will continue to monitor.

## 2019-03-29 NOTE — Evaluation (Signed)
Physical Therapy Evaluation Patient Details Name: Ann [name] MRN: [id number] DOB: 07-04-1956 Today's Date: 03/29/2019   History of Present Illness  s/p L TKA. PMH: OSA, HTN, obesity  Clinical Impression  Pt is s/p TKA resulting in the deficits listed below (see PT Problem List).  Pt will benefit from skilled PT to increase their independence and safety with mobility to allow discharge to the venue listed below.  Pt assisted to bathroom and then ambulated short distance in hallway.  Pt requiring at least min assist at this time and increased time to perform mobility.  Pt reports d/c plan is for SNF.      Follow Up Recommendations Follow surgeon's recommendation for DC plan and follow-up therapies(pt reports plan is SNF)    Equipment Recommendations  Rolling walker with 5" wheels    Recommendations for Other Services       Precautions / Restrictions Precautions Precautions: Fall;Knee Restrictions Other Position/Activity Restrictions: WBAT      Mobility  Bed Mobility Overal bed mobility: Needs Assistance Bed Mobility: Supine to Sit     Supine to sit: Min assist     General bed mobility comments: assist for L LE  Transfers Overall transfer level: Needs assistance Equipment used: Rolling walker (2 wheeled) Transfers: Sit to/from Stand Sit to Stand: Min assist         General transfer comment: verbal cues for UE and LE positioning, slight assist to rise  Ambulation/Gait Ambulation/Gait assistance: Min guard;Min assist Gait Distance (Feet): 80 Feet Assistive device: Rolling walker (2 wheeled) Gait Pattern/deviations: Step-to pattern;Decreased stance time - left;Antalgic     General Gait Details: verbal cues for sequence, RW positioning, step length, posture, pt reports L LE buckling so distance limited  Stairs            Wheelchair Mobility    Modified Rankin (Stroke Patients Only)       Balance                                              Pertinent Vitals/Pain Pain Assessment: 0-10 Pain Score: 4  Pain Location: L knee Pain Descriptors / Indicators: Aching;Sore Pain Intervention(s): Monitored during session;Limited activity within patient's tolerance;Repositioned;Premedicated before session    [place] expects to be discharged to:: Skilled nursing facility Living Arrangements: Alone   Type of Home: House Home Access: Ramped entrance     Home Layout: One level Home Equipment: Wheelchair - manual Additional Comments: plans to d/c to SNF    Prior Function Level of Independence: Independent               Hand Dominance        Extremity/Trunk Assessment        Lower Extremity Assessment Lower Extremity Assessment: LLE deficits/detail;RLE deficits/detail RLE Sensation: history of peripheral neuropathy LLE Deficits / Details: unable to perform SLR, ROM TBA, pt also reports some decreased sensation still in L LE       Communication   Communication: No difficulties  Cognition Arousal/Alertness: Awake/alert Behavior During Therapy: WFL for tasks assessed/performed Overall Cognitive Status: Within Functional Limits for tasks assessed                                        General Comments  Exercises     Assessment/Plan    PT Assessment Patient needs continued PT services  PT Problem List Decreased strength;Decreased range of motion;Decreased balance;Decreased knowledge of use of DME;Pain;Decreased mobility;Decreased knowledge of precautions;Decreased activity tolerance       PT Treatment Interventions DME instruction;Gait training;Balance training;Therapeutic exercise;Functional mobility training;Therapeutic activities;Patient/family education    PT Goals (Current goals can be found in the Care Plan section)  Acute Rehab PT Goals PT Goal Formulation: With patient Time For Goal Achievement: 04/02/19 Potential to Achieve Goals: Good     Frequency 7X/week   Barriers to discharge        Co-evaluation               AM-PAC PT "6 Clicks" Mobility  Outcome Measure Help needed turning from your back to your side while in a flat bed without using bedrails?: A Little Help needed moving from lying on your back to sitting on the side of a flat bed without using bedrails?: A Little Help needed moving to and from a bed to a chair (including a wheelchair)?: A Little Help needed standing up from a chair using your arms (e.g., wheelchair or bedside chair)?: A Little Help needed to walk in hospital room?: A Little Help needed climbing 3-5 steps with a railing? : A Lot 6 Click Score: 17    End of Session Equipment Utilized During Treatment: Gait belt Activity Tolerance: Patient tolerated treatment well Patient left: in chair;with chair alarm set;with call bell/phone within reach   PT Visit Diagnosis: Other abnormalities of gait and mobility (R26.89)    Time: 0940-1005 PT Time Calculation (min) (ACUTE ONLY): 25 min   Charges:   PT Evaluation $PT Eval Low Complexity: 1 Low PT Treatments $Gait Training: 8-22 mins   Carmelia Bake, PT, DPT Acute Rehabilitation Services Office: [phone] Pager: [phone] Trena Platt 03/29/2019, 1:21 PM

## 2019-03-29 NOTE — Progress Notes (Signed)
PATIENT ID: Ann [name]  MRN: [id number]  DOB/AGE:  1955/09/14 / 63 y.o.  1 Day Post-Op Procedure(s) (LRB): LEFT TOTAL KNEE ARTHROPLASTY (Left)    PROGRESS NOTE Subjective: Patient is alert, oriented, no Nausea, no Vomiting, yes passing gas. Taking PO well. Denies SOB, Chest or Calf Pain. Using Incentive Spirometer, PAS in place. Ambulate declined, Patient reports pain as 4/10 .    Objective: Vital signs in last 24 hours: Vitals:   03/28/19 2004 03/28/19 2017 03/29/19 0144 03/29/19 0434  BP:  118/68 123/64 103/64  Pulse:  (Abnormal) 57 (Abnormal) 55 (Abnormal) 55  Resp:  16 14 16   Temp:  97.8 F (36.6 C) 97.6 F (36.4 C) 97.7 F (36.5 C)  TempSrc:  Oral Oral Oral  SpO2: 96% 95% 97% 97%  Weight:      Height:          Intake/Output from previous day: I/O last 3 completed shifts: In: 3970.3 [P.O.:240; I.V.:3730.3] Out: 2280 [Urine:2030; Blood:250]   Intake/Output this shift: No intake/output data recorded.   LABORATORY DATA: Recent Labs    03/29/19 0241  WBC 9.0  HGB 12.6  HCT 39.9  PLT 236  NA 138  K 4.5  CL 108  CO2 23  BUN 17  CREATININE 0.98  GLUCOSE 183*  CALCIUM 8.2*    Examination: Neurologically intact ABD soft Neurovascular intact Sensation intact distally Intact pulses distally Dorsiflexion/Plantar flexion intact Incision: dressing C/D/I No cellulitis present Compartment soft}  Assessment:   1 Day Post-Op Procedure(s) (LRB): LEFT TOTAL KNEE ARTHROPLASTY (Left) ADDITIONAL DIAGNOSIS: Expected Acute Blood Loss Anemia, hx of stroke, morbid obesity 38.4, CHF, sleep Apnea Anticipated LOS equal to or greater than 2 midnights due to - Age 25 and older with one or more of the following:  - Obesity  - Expected need for hospital services (PT, OT, Nursing) required for safe  discharge  - Poor Family support  Plan: PT/OT WBAT, AROM and PROM  DVT Prophylaxis:  SCDx72hrs, ASA 81 mg BID x 2 weeks DISCHARGE PLAN: Home, whwn passes PT DISCHARGE  NEEDS: HHPT, Walker and 3-in-1 comode seat     [name] 03/29/2019, 7:55 AM Patient ID: Ann [name], female   DOB: 03-Feb-1956, 63 y.o.   MRN: [id number]

## 2019-03-29 NOTE — Anesthesia Postprocedure Evaluation (Signed)
Anesthesia Post Note  Patient: Ann [name]  Procedure(s) Performed: LEFT TOTAL KNEE ARTHROPLASTY (Left Knee)     Patient location during evaluation: PACU Anesthesia Type: Spinal Level of consciousness: oriented and awake and alert Pain management: pain level controlled Vital Signs Assessment: post-procedure vital signs reviewed and stable Respiratory status: spontaneous breathing, respiratory function stable and patient connected to nasal cannula oxygen Cardiovascular status: blood pressure returned to baseline and stable Postop Assessment: no headache, no backache and no apparent nausea or vomiting Anesthetic complications: no    Last Vitals:  Vitals:   03/29/19 0936 03/29/19 1325  BP: 100/63 121/77  Pulse: (!) 56 (!) 57  Resp: 16 15  Temp: 36.6 C 36.4 C  SpO2: 95% 95%    Last Pain:  Vitals:   03/29/19 0936  TempSrc: Oral  PainSc:                  Ann [name]

## 2019-03-29 NOTE — Plan of Care (Signed)
Continue current POC 

## 2019-03-30 LAB — BPAM RBC
Blood Product Expiration Date: 202009262359
Blood Product Expiration Date: 202009262359
ISSUE DATE / TIME: 202009221449
Unit Type and Rh: 600
Unit Type and Rh: 600

## 2019-03-30 LAB — TYPE AND SCREEN
ABO/RH(D): A NEG
Antibody Screen: POSITIVE
Donor AG Type: NEGATIVE
Donor AG Type: NEGATIVE
Unit division: 0
Unit division: 0

## 2019-04-19 ENCOUNTER — Encounter: Payer: Self-pay | Admitting: Physical Therapy

## 2019-04-19 ENCOUNTER — Other Ambulatory Visit: Payer: Self-pay

## 2019-04-19 ENCOUNTER — Ambulatory Visit: Payer: Medicaid Other | Attending: Orthopedic Surgery | Admitting: Physical Therapy

## 2019-04-19 DIAGNOSIS — M25662 Stiffness of left knee, not elsewhere classified: Secondary | ICD-10-CM | POA: Diagnosis present

## 2019-04-19 DIAGNOSIS — M25562 Pain in left knee: Secondary | ICD-10-CM | POA: Diagnosis not present

## 2019-04-19 DIAGNOSIS — R600 Localized edema: Secondary | ICD-10-CM | POA: Diagnosis present

## 2019-04-19 DIAGNOSIS — R262 Difficulty in walking, not elsewhere classified: Secondary | ICD-10-CM | POA: Diagnosis present

## 2019-04-19 NOTE — Therapy (Signed)
[address] [place], [place], [address] Phone: [phone]   Fax:  [phone]  Physical Therapy Evaluation  Patient Details  Name: Ann [name] MRN: [id number] Date of Birth: 07-30-1955 Referring Provider (PT): Dorathy Daft Date: 04/19/2019  PT End of Session - 04/19/19 1505    Visit Number  1    Date for PT Re-Evaluation  05/20/19    PT Start Time  1435    PT Stop Time  1520    PT Time Calculation (min)  45 min    Activity Tolerance  Patient tolerated treatment well    Behavior During Therapy  Anxious       Past Medical History:  Diagnosis Date  . Adenoma of colon   . Allergy    seasonal  . Anemia    with past pregnancy -not recent  . Anxiety   . Arthritis   . Borderline diabetes    diet controlled - no meds  . Chronic cystitis   . Complication of anesthesia    02-20-2014 ([facility]) INTRA-OP RESPIRATORY FAILURE SECONDARY TO POSSIBLE MUCOUS ASPIRATION/  ALSO 06-06-2013 [facility]) POST-OP DESATURATION  EVEN USING CPAP, States some issues if Propofol given to rapidly.  . Diverticulitis   . Diverticulosis of colon   . Dyspnea    only needs O2 at Night  . Family history of adverse reaction to anesthesia    PER PT SISTER DIED AFTER GENERAL ANESTHESIA DUE TO UNDIAGNOSED OSA  . GERD (gastroesophageal reflux disease)   . H/O hiatal hernia   . Headache(784.0)    migraines, decreased since turning 50's  . History of chronic cough    "tight sounding cough"  . History of esophageal dilatation   . History of kidney stones   . History of MRSA infection    3/ 2015  AXILLARY ABSCESS  . History of recurrent UTIs   . History of TIAs NO RESIDUAL   1980;  2005;   2008 PT STATES PER CT  SCARRING RIGHT SIDE OF BRAIN  . Hyperlipidemia   . Hypertension   . Kidney disease    III  . Neuromuscular disorder ([place])    HH  . Neuropathy    pt denies this 07-05-2015  . Nocturia   . OSA on CPAP    PER SLEEP  STUDY 09-08-2012  MODERATE OSA. not used in awhile, but thinks needs to start back using due to some weight gain.  . Osteoarthritis of knee    Right  . Sleep apnea    uses CPAP most nights  . Stroke [facility]) [phone]   TIA'S slight weakness on lt leg  . SUI (stress urinary incontinence, female)   . SVD (spontaneous vaginal delivery)    x 3    Past Surgical History:  Procedure Laterality Date  . ANTERIOR AND POSTERIOR REPAIR N/A 06/06/2013   Procedure: Anterior vaginal vault repair, Sacrospinous ligament fixation with UPHOLD lite, Kelly plication, Sacrospinous mesh fixation, Solyx transurethral sling;  Surgeon: Ailene Rud, MD;  Location: [facility];  Service: Urology;  Laterality: N/A;  . CHOLECYSTECTOMY N/A 02/10/2017   Procedure: LAPAROSCOPIC CHOLECYSTECTOMY;  Surgeon: Coralie Keens, MD;  Location: [place];  Service: General;  Laterality: N/A;  . COLECTOMY WITH COLOSTOMY CREATION/HARTMANN PROCEDURE N/A 06/11/2017   Procedure: OPEN SIGMOID COLECTOMY;  Surgeon: [name], MD;  [facility];  Service: General;  Laterality: N/A;  . COLONOSCOPY  08/2016   polyps -  repeat 5 years Armbruster  . CYSTOSCOPY W/ URETERAL STENT PLACEMENT Right 04/21/2014   Procedure: CYSTOSCOPY WITH RETROGRADE PYELOGRAM/URETERAL STENT PLACEMENT;  Surgeon: Ailene Rud, MD;  [facility];  Service: Urology;  Laterality: Right;  . CYSTOSCOPY W/ URETERAL STENT PLACEMENT Right 11/21/2014   Procedure: CYSTOSCOPY WITH RETROGRADE PYELOGRAM, URETEROSCOPY WITH STONE REMOVAL, URETERAL STENT PLACEMENT;  Surgeon: Carolan Clines, MD;  [facility];  Service: Urology;  Laterality: Right;  . CYSTOSCOPY W/ URETERAL STENT PLACEMENT Left 02/01/2016   Procedure: CYSTO, LEFT URETEROSCOPY, LEFT RETROGRADE AND LEFT URETERAL STENT PLACEMENT;  Surgeon: Carolan Clines, MD;  [facility];  Service: Urology;  Laterality: Left;  . CYSTOSCOPY WITH RETROGRADE PYELOGRAM, URETEROSCOPY  AND STENT PLACEMENT Right 06/05/2014   Procedure: CYSTOSCOPY WITH RIGHT RETROGRADE PYELOGRAM, RIGHT URETEROSCOPY, STONE EXTRACTION AND RIGHT DOUBLE J STENT PLACEMENT;  Surgeon: Ailene Rud, MD;  Location: [facility];  Service: Urology;  Laterality: Right;  . ESOPHAGEAL DILATION  X2  LAST ONE  JUNE 2014   has had 9 of these  . ESOPHAGEAL MANOMETRY N/A 09/10/2015   Procedure: ESOPHAGEAL MANOMETRY (EM);  Surgeon: Manus Gunning, MD;  [facility];  Service: Gastroenterology;  Laterality: N/A;  . FRACTURE SURGERY  09/2018   broken nose no surgery  . HOLMIUM LASER APPLICATION Right [id number]   Procedure: HOLMIUM LASER OF STONE ;  Surgeon: Ailene Rud, MD;  Location: [facility];  Service: Urology;  Laterality: Right;  . KNEE ARTHROSCOPY Bilateral 2008   x 2  . LAPAROSCOPIC CHOLECYSTECTOMY  02/10/2017  . MULTIPLE TOOTH EXTRACTIONS     past hx  . RECTOCELE REPAIR N/A 02/20/2014   Procedure: POSTERIOR REPAIR (RECTOCELE) WITH VAGINAL VAULT REPAIR WITH ACELL GRAFT PLACEMENT, PERINEOPLASTY, ENTEROCELE REPAIR;  Surgeon: Ailene Rud, MD;  [facility];  Service: Urology;  Laterality: N/A;  . TOTAL KNEE ARTHROPLASTY Left 03/28/2019   Procedure: LEFT TOTAL KNEE ARTHROPLASTY;  Surgeon: Frederik Pear, MD;  [facility];  Service: Orthopedics;  Laterality: Left;  . TUBAL LIGATION  1987  . UPPER GASTROINTESTINAL ENDOSCOPY  07/29/2018   Armbruster - need to repeat per MD  . Arnetha Courser TOOTH EXTRACTION      There were no vitals filed for this visit.   Subjective Assessment - 04/19/19 1432    Subjective  Patient reports that she had a left TKA on 03/28/19, she reports that she was really doing well and  had PT coming to her home, she reports that a PT that came to her house and "really over did it"  She reports that about 2 weeks ago she has had much worse pain, recent x-rays are negative, she reports that she is no longer taking pain  medications, and is really hurting and struggling.    Limitations  Lifting;Standing;Walking;House hold activities    Patient Stated Goals  have less pain, have better motions and walk better, have the right TKR on 05/16/19    Currently in Pain?  Yes    Pain Score  7     Pain Location  Knee    Pain Orientation  Left    Pain Descriptors / Indicators  Aching;Sore    Pain Type  Acute pain;Surgical pain    Pain Onset  1 to 4 weeks ago    Pain Frequency  Constant    Aggravating Factors   bending, walking pain up to 10/10    Pain Relieving Factors  ice and elevation helps pain down to  2/10    Effect of Pain on Daily Activities  difficulty with all ADL's, walking, bending         [facility] PT Assessment - 04/19/19 0001      Assessment   Medical Diagnosis  s/p left TKA    Referring Provider (PT)  Mayer Camel    Onset Date/Surgical Date  03/28/19    Prior Therapy  home PT      Precautions   Precautions  None      Balance Screen   Has the patient fallen in the past 6 months  No    Has the patient had a decrease in activity level because of a fear of falling?   No    Is the patient reluctant to leave their home because of a fear of falling?   No      Home Environment   Additional Comments  has stairs, does housework and was doing Haematologist      Prior Function   Level of Independence  Independent    Vocation  Unemployed    Leisure  no exercise      Observation/Other Assessments-Edema    Edema  Circumferential      Circumferential Edema   Circumferential - Right  43 cm    Circumferential - Left   43 cm      ROM / Strength   AROM / PROM / Strength  AROM;PROM;Strength      AROM   AROM Assessment Site  Knee    Right/Left Knee  Left    Left Knee Extension  10    Left Knee Flexion  75      PROM   Overall PROM Comments  very gaurded and apprehensive    PROM Assessment Site  Knee    Right/Left Knee  Left    Left Knee Extension  8    Left Knee Flexion  90      Strength   Overall  Strength Comments  3+/5       Palpation   Palpation comment  mild warmth, scar is tight, she is very gaurded      Ambulation/Gait   Gait Comments  uses a SPC, slow, hikes the hip the leg is very stiff she does not bend it                Objective measurements completed on examination: See above findings.      [place] Adult PT Treatment/Exercise - 04/19/19 0001      Modalities   Modalities  Vasopneumatic      Vasopneumatic   Number Minutes Vasopneumatic   10 minutes    Vasopnuematic Location   Knee    Vasopneumatic Pressure  Medium    Vasopneumatic Temperature   40             PT Education - 04/19/19 1505    Education Details  low load long duration stretches for flexion and extension    Person(s) Educated  Patient    Methods  Explanation;Demonstration;Handout    Comprehension  Verbalized understanding          PT Long Term Goals - 04/19/19 1513      PT LONG TERM GOAL #1   Title  decrease pain 50%    Time  12    Period  Weeks    Status  New      PT LONG TERM GOAL #2   Title  increase left knee AROM to 5-115 degrees flexion  Time  12    Period  Weeks    Status  New      PT LONG TERM GOAL #3   Title  walk without device with minimal deviation    Time  12    Period  Weeks    Status  New      PT LONG TERM GOAL #4   Title  go up and down stairs step over step    Time  12    Period  Weeks    Status  New             Plan - 04/19/19 1506    Clinical Impression Statement  Patient comes in she is very anxious.  She had a left TKA on 03/28/19, she had home PT and reports that a PT hurt her and she is afraid and has not been able to bend the knee since.  Her AROM is 10-75 degrees flexion, she walks with a very poor gait, stiff knee and hikes her hip, she reports pain in the hip that I think is from her hiking the hip.  She had some home health PT so I do not know if they will give [place] visits due to her being Medicaid.  The issue is she is  scheduled to have the right knee replaced in early November    Stability/Clinical Decision Making  Evolving/Moderate complexity    Clinical Decision Making  Low    Rehab Potential  Good    PT Frequency  1x / week    PT Duration  12 weeks    PT Treatment/Interventions  ADLs/Self Care Home Management;Cryotherapy;Electrical Stimulation;Gait training;Stair training;Functional mobility training;Neuromuscular re-education;Balance training;Therapeutic exercise;Therapeutic activities;Patient/family education;Manual techniques;Vasopneumatic Device    PT Next Visit Plan  will request visits from Medicaid    Consulted and Agree with Plan of Care  Patient       Patient will benefit from skilled therapeutic intervention in order to improve the following deficits and impairments:  Abnormal gait, Pain, Decreased mobility, Decreased strength, Decreased range of motion, Decreased activity tolerance, Decreased balance, Difficulty walking, Increased edema  Visit Diagnosis: Acute pain of left knee - Plan: PT plan of care cert/re-cert  Stiffness of left knee, not elsewhere classified - Plan: PT plan of care cert/re-cert  Difficulty in walking, not elsewhere classified - Plan: PT plan of care cert/re-cert  Localized edema - Plan: PT plan of care cert/re-cert     Problem List Patient Active Problem List   Diagnosis Date Noted  . S/P total knee arthroplasty, left 03/28/2019  . Degenerative arthritis of left knee 03/18/2019  . Rash 08/10/2018  . Pruritic rash 08/09/2018  . Headache 08/09/2018  . Osteoarthritis of right knee 08/28/2017  . Abnormal CT scan, colon   . HLD (hyperlipidemia) 06/03/2017  . Stroke (cerebrum) ([place]) 06/03/2017  . GERD (gastroesophageal reflux disease) 06/03/2017  . Anxiety 06/03/2017  . Chronic combined systolic (congestive) and diastolic (congestive) heart failure ([place]) 06/03/2017  . Hypokalemia 06/03/2017  . Diverticulitis large intestine 06/03/2017  . Lower abdominal pain  06/03/2017  . Non-intractable vomiting with nausea   . Abnormal CT of the abdomen   . Generalized abdominal pain   . LFT elevation   . Diverticulitis of colon 05/18/2017  . Symptomatic cholelithiasis 02/10/2017  . Hypoxia 01/11/2017  . Dyspnea 01/11/2017  . Dysphagia 04/04/2015  . History of esophageal stricture 04/04/2015  . Morbid (severe) obesity due to excess calories ([place]) 04/01/2015  . ACE-inhibitor cough 03/12/2015  .  Upper airway cough syndrome 03/12/2015  . Laryngopharyngeal reflux (LPR) 03/12/2015  . Elevated lipids 07/18/2014  . Chest pain 07/18/2014  . Internal hemorrhoids 04/25/2014  . Ureteral stone with hydronephrosis 04/22/2014  . Post-op pain 04/21/2014  . Rectocele, grade 3 02/20/2014  . Female rectocele without uterine prolapse 08/04/2013  . Unspecified constipation 07/23/2013  . Chronic respiratory failure with hypoxia ([place]) 06/07/2013  . Cystocele 06/06/2013  . Anosmia 01/25/2013  . Unspecified nutritional deficiency   . Polyneuropathy in other diseases classified elsewhere ([place])   . OSA (obstructive sleep apnea)   . Cough 07/27/2012  . Essential hypertension     Sumner Boast., PT 04/19/2019, 3:16 PM  [place], [place], [address] Phone: [phone]   Fax:  [phone]  Name: Ann [name] MRN: [id number] Date of Birth: 1955/12/18

## 2019-04-21 ENCOUNTER — Other Ambulatory Visit: Payer: Self-pay | Admitting: Orthopedic Surgery

## 2019-04-26 ENCOUNTER — Ambulatory Visit: Payer: Medicaid Other | Admitting: Physical Therapy

## 2019-04-26 ENCOUNTER — Other Ambulatory Visit: Payer: Self-pay

## 2019-04-26 DIAGNOSIS — M25562 Pain in left knee: Secondary | ICD-10-CM

## 2019-04-26 DIAGNOSIS — M25662 Stiffness of left knee, not elsewhere classified: Secondary | ICD-10-CM

## 2019-04-26 DIAGNOSIS — R600 Localized edema: Secondary | ICD-10-CM

## 2019-04-26 DIAGNOSIS — R262 Difficulty in walking, not elsewhere classified: Secondary | ICD-10-CM

## 2019-04-26 NOTE — Therapy (Signed)
[address] [place], [place], [address] Phone: [phone]   Fax:  [phone]  Physical Therapy Treatment  Patient Details  Name: Ann [name] MRN: [id number] Date of Birth: Oct 04, 1955 Referring Provider (PT): Dorathy Daft Date: 04/26/2019  PT End of Session - 04/26/19 [id number]    Visit Number  2    Number of Visits  4    Date for PT Re-Evaluation  05/20/19    PT Start Time  1520    PT Stop Time  1610    PT Time Calculation (min)  50 min       Past Medical History:  Diagnosis Date  . Adenoma of colon   . Allergy    seasonal  . Anemia    with past pregnancy -not recent  . Anxiety   . Arthritis   . Borderline diabetes    diet controlled - no meds  . Chronic cystitis   . Complication of anesthesia    02-20-2014 ([facility]) INTRA-OP RESPIRATORY FAILURE SECONDARY TO POSSIBLE MUCOUS ASPIRATION/  ALSO 06-06-2013 [facility]) POST-OP DESATURATION  EVEN USING CPAP, States some issues if Propofol given to rapidly.  . Diverticulitis   . Diverticulosis of colon   . Dyspnea    only needs O2 at Night  . Family history of adverse reaction to anesthesia    PER PT SISTER DIED AFTER GENERAL ANESTHESIA DUE TO UNDIAGNOSED OSA  . GERD (gastroesophageal reflux disease)   . H/O hiatal hernia   . Headache(784.0)    migraines, decreased since turning 50's  . History of chronic cough    "tight sounding cough"  . History of esophageal dilatation   . History of kidney stones   . History of MRSA infection    3/ 2015  AXILLARY ABSCESS  . History of recurrent UTIs   . History of TIAs NO RESIDUAL   1980;  2005;   2008 PT STATES PER CT  SCARRING RIGHT SIDE OF BRAIN  . Hyperlipidemia   . Hypertension   . Kidney disease    III  . Neuromuscular disorder ([place])    HH  . Neuropathy    pt denies this 07-05-2015  . Nocturia   . OSA on CPAP    PER SLEEP STUDY 09-08-2012  MODERATE OSA. not used in awhile, but thinks needs to  start back using due to some weight gain.  . Osteoarthritis of knee    Right  . Sleep apnea    uses CPAP most nights  . Stroke [facility]) [phone]   TIA'S slight weakness on lt leg  . SUI (stress urinary incontinence, female)   . SVD (spontaneous vaginal delivery)    x 3    Past Surgical History:  Procedure Laterality Date  . ANTERIOR AND POSTERIOR REPAIR N/A 06/06/2013   Procedure: Anterior vaginal vault repair, Sacrospinous ligament fixation with UPHOLD lite, Kelly plication, Sacrospinous mesh fixation, Solyx transurethral sling;  Surgeon: Ailene Rud, MD;  [facility];  Service: Urology;  Laterality: N/A;  . CHOLECYSTECTOMY N/A 02/10/2017   Procedure: LAPAROSCOPIC CHOLECYSTECTOMY;  Surgeon: Coralie Keens, MD;  Location: [place];  Service: General;  Laterality: N/A;  . COLECTOMY WITH COLOSTOMY CREATION/HARTMANN PROCEDURE N/A 06/11/2017   Procedure: OPEN SIGMOID COLECTOMY;  Surgeon: [name], MD;  [facility];  Service: General;  Laterality: N/A;  . COLONOSCOPY  08/2016   polyps - repeat 5 years Armbruster  . CYSTOSCOPY W/ URETERAL STENT  PLACEMENT Right 04/21/2014   Procedure: CYSTOSCOPY WITH RETROGRADE PYELOGRAM/URETERAL STENT PLACEMENT;  Surgeon: Ailene Rud, MD;  [facility];  Service: Urology;  Laterality: Right;  . CYSTOSCOPY W/ URETERAL STENT PLACEMENT Right 11/21/2014   Procedure: CYSTOSCOPY WITH RETROGRADE PYELOGRAM, URETEROSCOPY WITH STONE REMOVAL, URETERAL STENT PLACEMENT;  Surgeon: Carolan Clines, MD;  [facility];  Service: Urology;  Laterality: Right;  . CYSTOSCOPY W/ URETERAL STENT PLACEMENT Left 02/01/2016   Procedure: CYSTO, LEFT URETEROSCOPY, LEFT RETROGRADE AND LEFT URETERAL STENT PLACEMENT;  Surgeon: Carolan Clines, MD;  [facility];  Service: Urology;  Laterality: Left;  . CYSTOSCOPY WITH RETROGRADE PYELOGRAM, URETEROSCOPY AND STENT PLACEMENT Right 06/05/2014   Procedure: CYSTOSCOPY WITH RIGHT  RETROGRADE PYELOGRAM, RIGHT URETEROSCOPY, STONE EXTRACTION AND RIGHT DOUBLE J STENT PLACEMENT;  Surgeon: Ailene Rud, MD;  Location: [facility];  Service: Urology;  Laterality: Right;  . ESOPHAGEAL DILATION  X2  LAST ONE  JUNE 2014   has had 9 of these  . ESOPHAGEAL MANOMETRY N/A 09/10/2015   Procedure: ESOPHAGEAL MANOMETRY (EM);  Surgeon: Manus Gunning, MD;  [facility];  Service: Gastroenterology;  Laterality: N/A;  . FRACTURE SURGERY  09/2018   broken nose no surgery  . HOLMIUM LASER APPLICATION Right [id number]   Procedure: HOLMIUM LASER OF STONE ;  Surgeon: Ailene Rud, MD;  Location: [facility];  Service: Urology;  Laterality: Right;  . KNEE ARTHROSCOPY Bilateral 2008   x 2  . LAPAROSCOPIC CHOLECYSTECTOMY  02/10/2017  . MULTIPLE TOOTH EXTRACTIONS     past hx  . RECTOCELE REPAIR N/A 02/20/2014   Procedure: POSTERIOR REPAIR (RECTOCELE) WITH VAGINAL VAULT REPAIR WITH ACELL GRAFT PLACEMENT, PERINEOPLASTY, ENTEROCELE REPAIR;  Surgeon: Ailene Rud, MD;  [facility];  Service: Urology;  Laterality: N/A;  . TOTAL KNEE ARTHROPLASTY Left 03/28/2019   Procedure: LEFT TOTAL KNEE ARTHROPLASTY;  Surgeon: Frederik Pear, MD;  [facility];  Service: Orthopedics;  Laterality: Left;  . TUBAL LIGATION  1987  . UPPER GASTROINTESTINAL ENDOSCOPY  07/29/2018   Armbruster - need to repeat per MD  . Arnetha Courser TOOTH EXTRACTION      There were no vitals filed for this visit.  Subjective Assessment - 04/26/19 1613    Subjective  my left hip is kiiling, maybe the way I am walking , but it is awful    Currently in Pain?  Yes    Pain Score  8     Pain Location  Hip    Pain Orientation  Left         [facility] PT Assessment - 04/26/19 0001      AROM   Left Knee Extension  8    Left Knee Flexion  100                   [facility] Adult PT Treatment/Exercise - 04/26/19 0001      Exercises   Exercises  Lumbar;Knee/Hip       Lumbar Exercises: Aerobic   Nustep  L 4 5 min      Lumbar Exercises: Machines for Strengthening   Cybex Knee Extension  5# 10 reps    Cybex Knee Flexion  15# 10 reps    Leg Press  20# 10 reps, cued for full ext      Lumbar Exercises: Standing   Heel Raises  10 reps;3 seconds   black bar   Other Standing Lumbar Exercises  red TKE 10 reps  Modalities   Modalities  Cryotherapy;Moist Heat      Moist Heat Therapy   Number Minutes Moist Heat  15 Minutes    Moist Heat Location  Hip      Cryotherapy   Number Minutes Cryotherapy  15 Minutes    Cryotherapy Location  Knee    Type of Cryotherapy  Ice pack      Manual Therapy   Manual Therapy  Soft tissue mobilization;Passive ROM    Soft tissue mobilization  :Left ITB    Passive ROM  left kneee flex and ext and ITB stretch             PT Education - 04/26/19 1617    Education Details  ITB stretch and STW to ITB    Person(s) Educated  Patient    Methods  Explanation;Demonstration;Handout    Comprehension  Verbalized understanding;Returned demonstration          PT Long Term Goals - 04/19/19 1513      PT LONG TERM GOAL #1   Title  decrease pain 50%    Time  12    Period  Weeks    Status  New      PT LONG TERM GOAL #2   Title  increase left knee AROM to 5-115 degrees flexion    Time  12    Period  Weeks    Status  New      PT LONG TERM GOAL #3   Title  walk without device with minimal deviation    Time  12    Period  Weeks    Status  New      PT LONG TERM GOAL #4   Title  go up and down stairs step over step    Time  12    Period  Weeks    Status  New            Plan - 04/26/19 1639    Clinical Impression Statement  pt with left hip paina nd tightness with multi TP in Left ITB, responded well to STW an deduc on STW with daughter help and stretching at home. pt with improved AROM. Pt gait is fair and she amb with BIL knee flexion d/t RT knee needing replaced too. Pt was very anxious about tx  and ex but with cuing she did very well    PT Treatment/Interventions  ADLs/Self Care Home Management;Cryotherapy;Electrical Stimulation;Gait training;Stair training;Functional mobility training;Neuromuscular re-education;Balance training;Therapeutic exercise;Therapeutic activities;Patient/family education;Manual techniques;Vasopneumatic Device    PT Next Visit Plan  check ITB and gait, progress ROM and strength       Patient will benefit from skilled therapeutic intervention in order to improve the following deficits and impairments:  Abnormal gait, Pain, Decreased mobility, Decreased strength, Decreased range of motion, Decreased activity tolerance, Decreased balance, Difficulty walking, Increased edema  Visit Diagnosis: Acute pain of left knee  Stiffness of left knee, not elsewhere classified  Difficulty in walking, not elsewhere classified  Localized edema     Problem List Patient Active Problem List   Diagnosis Date Noted  . S/P total knee arthroplasty, left 03/28/2019  . Degenerative arthritis of left knee 03/18/2019  . Rash 08/10/2018  . Pruritic rash 08/09/2018  . Headache 08/09/2018  . Osteoarthritis of right knee 08/28/2017  . Abnormal CT scan, colon   . HLD (hyperlipidemia) 06/03/2017  . Stroke (cerebrum) ([place]) 06/03/2017  . GERD (gastroesophageal reflux disease) 06/03/2017  . Anxiety 06/03/2017  . Chronic combined systolic (congestive)  and diastolic (congestive) heart failure ([place]) 06/03/2017  . Hypokalemia 06/03/2017  . Diverticulitis large intestine 06/03/2017  . Lower abdominal pain 06/03/2017  . Non-intractable vomiting with nausea   . Abnormal CT of the abdomen   . Generalized abdominal pain   . LFT elevation   . Diverticulitis of colon 05/18/2017  . Symptomatic cholelithiasis 02/10/2017  . Hypoxia 01/11/2017  . Dyspnea 01/11/2017  . Dysphagia 04/04/2015  . History of esophageal stricture 04/04/2015  . Morbid (severe) obesity due to excess calories  ([place]) 04/01/2015  . ACE-inhibitor cough 03/12/2015  . Upper airway cough syndrome 03/12/2015  . Laryngopharyngeal reflux (LPR) 03/12/2015  . Elevated lipids 07/18/2014  . Chest pain 07/18/2014  . Internal hemorrhoids 04/25/2014  . Ureteral stone with hydronephrosis 04/22/2014  . Post-op pain 04/21/2014  . Rectocele, grade 3 02/20/2014  . Female rectocele without uterine prolapse 08/04/2013  . Unspecified constipation 07/23/2013  . Chronic respiratory failure with hypoxia ([place]) 06/07/2013  . Cystocele 06/06/2013  . Anosmia 01/25/2013  . Unspecified nutritional deficiency   . Polyneuropathy in other diseases classified elsewhere ([place])   . OSA (obstructive sleep apnea)   . Cough 07/27/2012  . Essential hypertension     Cadarius Nevares,ANGIE PTA 04/26/2019, 4:42 PM  [address] [place], [place], [address] Phone: [phone]   Fax:  [phone]  Name: Ann [name] MRN: [id number] Date of Birth: March 24, 1956

## 2019-05-05 ENCOUNTER — Ambulatory Visit: Payer: Medicaid Other | Admitting: Physical Therapy

## 2019-05-05 ENCOUNTER — Other Ambulatory Visit: Payer: Self-pay

## 2019-05-05 DIAGNOSIS — M25662 Stiffness of left knee, not elsewhere classified: Secondary | ICD-10-CM

## 2019-05-05 DIAGNOSIS — M25562 Pain in left knee: Secondary | ICD-10-CM | POA: Diagnosis not present

## 2019-05-05 NOTE — Therapy (Signed)
[place], [place], [address] Phone: [phone]   Fax:  [phone]  Physical Therapy Treatment  Patient Details  Name: Ann [name] MRN: [id number] Date of Birth: [birth year] Referring Provider (PT): Dorathy Daft Date: 05/05/2019  PT End of Session - 05/05/19 1625    Visit Number  3    PT Start Time  1657    PT Stop Time  1628    PT Time Calculation (min)  58 min       Past Medical History:  Diagnosis Date  . Adenoma of colon   . Allergy    seasonal  . Anemia    with past pregnancy -not recent  . Anxiety   . Arthritis   . Borderline diabetes    diet controlled - no meds  . Chronic cystitis   . Complication of anesthesia    02-20-2014 ([facility]) INTRA-OP RESPIRATORY FAILURE SECONDARY TO POSSIBLE MUCOUS ASPIRATION/  ALSO 06-06-2013 [facility]) POST-OP DESATURATION  EVEN USING CPAP, States some issues if Propofol given to rapidly.  . Diverticulitis   . Diverticulosis of colon   . Dyspnea    only needs O2 at Night  . Family history of adverse reaction to anesthesia    PER PT SISTER DIED AFTER GENERAL ANESTHESIA DUE TO UNDIAGNOSED OSA  . GERD (gastroesophageal reflux disease)   . H/O hiatal hernia   . Headache(784.0)    migraines, decreased since turning 50's  . History of chronic cough    "tight sounding cough"  . History of esophageal dilatation   . History of kidney stones   . History of MRSA infection    3/ 2015  AXILLARY ABSCESS  . History of recurrent UTIs   . History of TIAs NO RESIDUAL   1980;  2005;   2008 PT STATES PER CT  SCARRING RIGHT SIDE OF BRAIN  . Hyperlipidemia   . Hypertension   . Kidney disease    III  . Neuromuscular disorder ([place])    HH  . Neuropathy    pt denies this 07-05-2015  . Nocturia   . OSA on CPAP    PER SLEEP STUDY 09-08-2012  MODERATE OSA. not used in awhile, but thinks needs to start back using due to some weight gain.  . Osteoarthritis of  knee    Right  . Sleep apnea    uses CPAP most nights  . Stroke [facility]) [phone]   TIA'S slight weakness on lt leg  . SUI (stress urinary incontinence, female)   . SVD (spontaneous vaginal delivery)    x 3    Past Surgical History:  Procedure Laterality Date  . ANTERIOR AND POSTERIOR REPAIR N/A 06/06/2013   Procedure: Anterior vaginal vault repair, Sacrospinous ligament fixation with UPHOLD lite, Kelly plication, Sacrospinous mesh fixation, Solyx transurethral sling;  Surgeon: Ailene Rud, MD;  Location: [facility];  Service: Urology;  Laterality: N/A;  . CHOLECYSTECTOMY N/A 02/10/2017   Procedure: LAPAROSCOPIC CHOLECYSTECTOMY;  Surgeon: Coralie Keens, MD;  Location: [facility];  Service: General;  Laterality: N/A;  . COLECTOMY WITH COLOSTOMY CREATION/HARTMANN PROCEDURE N/A 06/11/2017   Procedure: OPEN SIGMOID COLECTOMY;  Surgeon: [name], MD;  Location: [facility];  Service: General;  Laterality: N/A;  . COLONOSCOPY  08/2016   polyps - repeat 5 years [facility]  . CYSTOSCOPY W/ URETERAL STENT PLACEMENT Right 04/21/2014   Procedure: CYSTOSCOPY WITH RETROGRADE PYELOGRAM/URETERAL STENT PLACEMENT;  Surgeon: Ailene Rud,  MD;  Location: [facility];  Service: Urology;  Laterality: Right;  . CYSTOSCOPY W/ URETERAL STENT PLACEMENT Right 11/21/2014   Procedure: CYSTOSCOPY WITH RETROGRADE PYELOGRAM, URETEROSCOPY WITH STONE REMOVAL, URETERAL STENT PLACEMENT;  Surgeon: Carolan Clines, MD;  Location: [facility];  Service: Urology;  Laterality: Right;  . CYSTOSCOPY W/ URETERAL STENT PLACEMENT Left 02/01/2016   Procedure: CYSTO, LEFT URETEROSCOPY, LEFT RETROGRADE AND LEFT URETERAL STENT PLACEMENT;  Surgeon: Carolan Clines, MD;  Location: [facility];  Service: Urology;  Laterality: Left;  . CYSTOSCOPY WITH RETROGRADE PYELOGRAM, URETEROSCOPY AND STENT PLACEMENT Right 06/05/2014   Procedure: CYSTOSCOPY WITH RIGHT RETROGRADE PYELOGRAM, RIGHT URETEROSCOPY, STONE EXTRACTION AND  RIGHT DOUBLE J STENT PLACEMENT;  Surgeon: Ailene Rud, MD;  Location: [facility];  Service: Urology;  Laterality: Right;  . ESOPHAGEAL DILATION  X2  LAST ONE  JUNE 2014   has had 9 of these  . ESOPHAGEAL MANOMETRY N/A 09/10/2015   Procedure: ESOPHAGEAL MANOMETRY (EM);  Surgeon: Manus Gunning, MD;  [facility];  Service: Gastroenterology;  Laterality: N/A;  . FRACTURE SURGERY  09/2018   broken nose no surgery  . HOLMIUM LASER APPLICATION Right 82/70/7867   Procedure: HOLMIUM LASER OF STONE ;  Surgeon: Ailene Rud, MD;  Location: [facility];  Service: Urology;  Laterality: Right;  . KNEE ARTHROSCOPY Bilateral 2008   x 2  . LAPAROSCOPIC CHOLECYSTECTOMY  02/10/2017  . MULTIPLE TOOTH EXTRACTIONS     past hx  . RECTOCELE REPAIR N/A 02/20/2014   Procedure: POSTERIOR REPAIR (RECTOCELE) WITH VAGINAL VAULT REPAIR WITH ACELL GRAFT PLACEMENT, PERINEOPLASTY, ENTEROCELE REPAIR;  Surgeon: Ailene Rud, MD;  Location[facility];  Service: Urology;  Laterality: N/A;  . TOTAL KNEE ARTHROPLASTY Left 03/28/2019   Procedure: LEFT TOTAL KNEE ARTHROPLASTY;  Surgeon: Frederik Pear, MD;  Location: [facility];  Service: Orthopedics;  Laterality: Left;  . TUBAL LIGATION  1987  . UPPER GASTROINTESTINAL ENDOSCOPY  07/29/2018   [facility] - need to repeat per MD  . Arnetha Courser TOOTH EXTRACTION      There were no vitals filed for this visit.  Subjective Assessment - 05/05/19 1536    Subjective  Feeling knot in the left hip    Currently in Pain?  No/denies         [facility] PT Assessment - 05/05/19 0001      AROM   Left Knee Extension  8    Left Knee Flexion  100                   [facility] Adult PT Treatment/Exercise - 05/05/19 0001      Lumbar Exercises: Aerobic   Nustep  L 4 5 min      Lumbar Exercises: Machines for Strengthening   Cybex Knee Extension  5# 10 reps    Cybex Knee Flexion  15# 10 reps    Leg Press  30# 20 reps, cued for  full ext      Knee/Hip Exercises: Standing   Forward Step Up  Step Height: 6";Left;5 reps;Hand Hold: 2      Cryotherapy   Number Minutes Cryotherapy  10 Minutes    Cryotherapy Location  Knee    Type of Cryotherapy  Ice pack      Manual Therapy   Manual Therapy  Soft tissue mobilization;Passive ROM    Soft tissue mobilization  :Left ITB    Passive ROM  left kneee flex and ext and ITB stretch  PT Long Term Goals - 05/05/19 1627      PT LONG TERM GOAL #1   Title  decrease pain 50%    Status  Partially Met      PT LONG TERM GOAL #2   Title  increase left knee AROM to 5-115 degrees flexion    Status  Partially Met      PT LONG TERM GOAL #3   Title  walk without device with minimal deviation    Status  On-going      PT LONG TERM GOAL #4   Title  go up and down stairs step over step    Status  On-going            Plan - 05/05/19 1621    Clinical Impression Statement  pt next a rest after doing step ups due to SOB. Pt able to increase weight on leg press, but still needs cues for extension. pt needs cues for steps to improve technique. Pt continues to need STM of ITB for TP. measurement were taken, they are the same as last time.    Stability/Clinical Decision Making  Evolving/Moderate complexity    Rehab Potential  Good    PT Frequency  1x / week    PT Duration  12 weeks    PT Treatment/Interventions  ADLs/Self Care Home Management;Cryotherapy;Electrical Stimulation;Gait training;Stair training;Functional mobility training;Neuromuscular re-education;Balance training;Therapeutic exercise;Therapeutic activities;Patient/family education;Manual techniques;Vasopneumatic Device    PT Next Visit Plan  check ITB and gait, progress ROM and strength       Patient will benefit from skilled therapeutic intervention in order to improve the following deficits and impairments:  Abnormal gait, Pain, Decreased mobility, Decreased strength, Decreased range of  motion, Decreased activity tolerance, Decreased balance, Difficulty walking, Increased edema  Visit Diagnosis: Stiffness of left knee, not elsewhere classified     Problem List Patient Active Problem List   Diagnosis Date Noted  . S/P total knee arthroplasty, left 03/28/2019  . Degenerative arthritis of left knee 03/18/2019  . Rash 08/10/2018  . Pruritic rash 08/09/2018  . Headache 08/09/2018  . Osteoarthritis of right knee 08/28/2017  . Abnormal CT scan, colon   . HLD (hyperlipidemia) 06/03/2017  . Stroke (cerebrum) ([place]) 06/03/2017  . GERD (gastroesophageal reflux disease) 06/03/2017  . Anxiety 06/03/2017  . Chronic combined systolic (congestive) and diastolic (congestive) heart failure ([place]) 06/03/2017  . Hypokalemia 06/03/2017  . Diverticulitis large intestine 06/03/2017  . Lower abdominal pain 06/03/2017  . Non-intractable vomiting with nausea   . Abnormal CT of the abdomen   . Generalized abdominal pain   . LFT elevation   . Diverticulitis of colon 05/18/2017  . Symptomatic cholelithiasis 02/10/2017  . Hypoxia 01/11/2017  . Dyspnea 01/11/2017  . Dysphagia 04/04/2015  . History of esophageal stricture 04/04/2015  . Morbid (severe) obesity due to excess calories ([place]) 04/01/2015  . ACE-inhibitor cough 03/12/2015  . Upper airway cough syndrome 03/12/2015  . Laryngopharyngeal reflux (LPR) 03/12/2015  . Elevated lipids 07/18/2014  . Chest pain 07/18/2014  . Internal hemorrhoids 04/25/2014  . Ureteral stone with hydronephrosis 04/22/2014  . Post-op pain 04/21/2014  . Rectocele, grade 3 02/20/2014  . Female rectocele without uterine prolapse 08/04/2013  . Unspecified constipation 07/23/2013  . Chronic respiratory failure with hypoxia ([place]) 06/07/2013  . Cystocele 06/06/2013  . Anosmia 01/25/2013  . Unspecified nutritional deficiency   . Polyneuropathy in other diseases classified elsewhere ([place])   . OSA (obstructive sleep apnea)   . Cough 07/27/2012  . Essential  hypertension     Barrett Henle, [place] 05/05/2019, 4:31 PM  [place] [address] [place], [place], [address] Phone: [phone]   Fax:  [phone]  Name: KRISTYNA BRADSTREET MRN: [id number] Date of Birth: 31-Jan-1956

## 2019-05-08 ENCOUNTER — Inpatient Hospital Stay (HOSPITAL_COMMUNITY): Payer: Medicaid Other

## 2019-05-08 ENCOUNTER — Encounter (HOSPITAL_COMMUNITY): Payer: Self-pay

## 2019-05-08 ENCOUNTER — Inpatient Hospital Stay (HOSPITAL_COMMUNITY): Payer: Medicaid Other | Admitting: Certified Registered Nurse Anesthetist

## 2019-05-08 ENCOUNTER — Other Ambulatory Visit: Payer: Self-pay

## 2019-05-08 ENCOUNTER — Inpatient Hospital Stay (HOSPITAL_COMMUNITY)
Admission: EM | Admit: 2019-05-08 | Discharge: 2019-05-13 | DRG: 854 | Disposition: A | Discharge status: Home / Self Care | Payer: Medicaid Other | Attending: Internal Medicine | Admitting: Internal Medicine

## 2019-05-08 ENCOUNTER — Encounter (HOSPITAL_COMMUNITY)
Admission: EM | Disposition: A | Discharge status: Home / Self Care | Payer: Self-pay | Source: Home / Self Care | Attending: Internal Medicine

## 2019-05-08 ENCOUNTER — Emergency Department (HOSPITAL_COMMUNITY): Payer: Medicaid Other

## 2019-05-08 DIAGNOSIS — Z87440 Personal history of urinary (tract) infections: Secondary | ICD-10-CM

## 2019-05-08 DIAGNOSIS — R7989 Other specified abnormal findings of blood chemistry: Secondary | ICD-10-CM

## 2019-05-08 DIAGNOSIS — Z79899 Other long term (current) drug therapy: Secondary | ICD-10-CM | POA: Diagnosis not present

## 2019-05-08 DIAGNOSIS — A4151 Sepsis due to Escherichia coli [E. coli]: Secondary | ICD-10-CM | POA: Diagnosis present

## 2019-05-08 DIAGNOSIS — E876 Hypokalemia: Secondary | ICD-10-CM | POA: Diagnosis present

## 2019-05-08 DIAGNOSIS — Z96652 Presence of left artificial knee joint: Secondary | ICD-10-CM | POA: Diagnosis present

## 2019-05-08 DIAGNOSIS — E785 Hyperlipidemia, unspecified: Secondary | ICD-10-CM | POA: Diagnosis present

## 2019-05-08 DIAGNOSIS — N132 Hydronephrosis with renal and ureteral calculous obstruction: Secondary | ICD-10-CM

## 2019-05-08 DIAGNOSIS — R1031 Right lower quadrant pain: Secondary | ICD-10-CM | POA: Diagnosis present

## 2019-05-08 DIAGNOSIS — Z8673 Personal history of transient ischemic attack (TIA), and cerebral infarction without residual deficits: Secondary | ICD-10-CM

## 2019-05-08 DIAGNOSIS — D631 Anemia in chronic kidney disease: Secondary | ICD-10-CM | POA: Diagnosis present

## 2019-05-08 DIAGNOSIS — I130 Hypertensive heart and chronic kidney disease with heart failure and stage 1 through stage 4 chronic kidney disease, or unspecified chronic kidney disease: Secondary | ICD-10-CM | POA: Diagnosis present

## 2019-05-08 DIAGNOSIS — N12 Tubulo-interstitial nephritis, not specified as acute or chronic: Secondary | ICD-10-CM

## 2019-05-08 DIAGNOSIS — G4733 Obstructive sleep apnea (adult) (pediatric): Secondary | ICD-10-CM | POA: Diagnosis present

## 2019-05-08 DIAGNOSIS — G43909 Migraine, unspecified, not intractable, without status migrainosus: Secondary | ICD-10-CM | POA: Diagnosis present

## 2019-05-08 DIAGNOSIS — J9611 Chronic respiratory failure with hypoxia: Secondary | ICD-10-CM | POA: Diagnosis present

## 2019-05-08 DIAGNOSIS — B9620 Unspecified Escherichia coli [E. coli] as the cause of diseases classified elsewhere: Secondary | ICD-10-CM | POA: Diagnosis not present

## 2019-05-08 DIAGNOSIS — Z9981 Dependence on supplemental oxygen: Secondary | ICD-10-CM

## 2019-05-08 DIAGNOSIS — Z7982 Long term (current) use of aspirin: Secondary | ICD-10-CM

## 2019-05-08 DIAGNOSIS — N183 Chronic kidney disease, stage 3 unspecified: Secondary | ICD-10-CM | POA: Diagnosis present

## 2019-05-08 DIAGNOSIS — N10 Acute tubulo-interstitial nephritis: Secondary | ICD-10-CM | POA: Diagnosis not present

## 2019-05-08 DIAGNOSIS — N201 Calculus of ureter: Secondary | ICD-10-CM

## 2019-05-08 DIAGNOSIS — N134 Hydroureter: Secondary | ICD-10-CM | POA: Diagnosis not present

## 2019-05-08 DIAGNOSIS — R945 Abnormal results of liver function studies: Secondary | ICD-10-CM

## 2019-05-08 DIAGNOSIS — I5022 Chronic systolic (congestive) heart failure: Secondary | ICD-10-CM | POA: Diagnosis present

## 2019-05-08 DIAGNOSIS — Z6838 Body mass index (BMI) 38.0-38.9, adult: Secondary | ICD-10-CM | POA: Diagnosis not present

## 2019-05-08 DIAGNOSIS — Z9049 Acquired absence of other specified parts of digestive tract: Secondary | ICD-10-CM

## 2019-05-08 DIAGNOSIS — N1330 Unspecified hydronephrosis: Secondary | ICD-10-CM | POA: Diagnosis not present

## 2019-05-08 DIAGNOSIS — Z7951 Long term (current) use of inhaled steroids: Secondary | ICD-10-CM | POA: Diagnosis not present

## 2019-05-08 DIAGNOSIS — R110 Nausea: Secondary | ICD-10-CM | POA: Diagnosis not present

## 2019-05-08 DIAGNOSIS — K219 Gastro-esophageal reflux disease without esophagitis: Secondary | ICD-10-CM | POA: Diagnosis present

## 2019-05-08 DIAGNOSIS — J449 Chronic obstructive pulmonary disease, unspecified: Secondary | ICD-10-CM | POA: Diagnosis present

## 2019-05-08 DIAGNOSIS — Z20828 Contact with and (suspected) exposure to other viral communicable diseases: Secondary | ICD-10-CM | POA: Diagnosis present

## 2019-05-08 DIAGNOSIS — R7881 Bacteremia: Secondary | ICD-10-CM | POA: Diagnosis not present

## 2019-05-08 DIAGNOSIS — N136 Pyonephrosis: Secondary | ICD-10-CM | POA: Diagnosis present

## 2019-05-08 DIAGNOSIS — I10 Essential (primary) hypertension: Secondary | ICD-10-CM | POA: Diagnosis not present

## 2019-05-08 DIAGNOSIS — N1831 Chronic kidney disease, stage 3a: Secondary | ICD-10-CM | POA: Diagnosis not present

## 2019-05-08 HISTORY — PX: CYSTOSCOPY W/ URETERAL STENT PLACEMENT: SHX1429

## 2019-05-08 LAB — URINALYSIS, ROUTINE W REFLEX MICROSCOPIC
Bilirubin Urine: NEGATIVE
Glucose, UA: NEGATIVE mg/dL
Ketones, ur: NEGATIVE mg/dL
Nitrite: POSITIVE — AB
Protein, ur: 100 mg/dL — AB
Specific Gravity, Urine: 1.018 (ref 1.005–1.030)
WBC, UA: >50 WBC/hpf — ABNORMAL HIGH (ref 0–5)
pH: 6 (ref 5.0–8.0)

## 2019-05-08 LAB — CBC
HCT: 41.8 % (ref 36.0–46.0)
Hemoglobin: 13.2 g/dL (ref 12.0–15.0)
MCH: 32.1 pg (ref 26.0–34.0)
MCHC: 31.6 g/dL (ref 30.0–36.0)
MCV: 101.7 fL — ABNORMAL HIGH (ref 80.0–100.0)
Platelets: 268 10*3/uL (ref 150–400)
RBC: 4.11 MIL/uL (ref 3.87–5.11)
RDW: 13.8 % (ref 11.5–15.5)
WBC: 13.6 10*3/uL — ABNORMAL HIGH (ref 4.0–10.5)
nRBC: 0 % (ref 0.0–0.2)

## 2019-05-08 LAB — BASIC METABOLIC PANEL
Anion gap: 10 (ref 5–15)
BUN: 19 mg/dL (ref 6–20)
CO2: 19 mmol/L — ABNORMAL LOW (ref 22–32)
Calcium: 8.7 mg/dL — ABNORMAL LOW (ref 8.9–10.3)
Chloride: 108 mmol/L (ref 98–111)
Creatinine, Ser: 1.11 mg/dL — ABNORMAL HIGH (ref 0.44–1.00)
GFR calc Af Amer: >60 mL/min (ref >60)
GFR calc non Af Amer: 54 mL/min — ABNORMAL LOW (ref >60)
Glucose, Bld: 130 mg/dL — ABNORMAL HIGH (ref 70–99)
Potassium: 3.7 mmol/L (ref 3.5–5.1)
Sodium: 137 mmol/L (ref 135–145)

## 2019-05-08 LAB — HCG, QUANTITATIVE, PREGNANCY: hCG, Beta Chain, Quant, S: 1 m[IU]/mL (ref <5)

## 2019-05-08 LAB — LACTIC ACID, PLASMA: Lactic Acid, Venous: 1 mmol/L (ref 0.5–1.9)

## 2019-05-08 LAB — SARS CORONAVIRUS 2 BY RT PCR (HOSPITAL ORDER, PERFORMED IN ~~LOC~~ HOSPITAL LAB): SARS Coronavirus 2: NEGATIVE

## 2019-05-08 SURGERY — CYSTOSCOPY, WITH RETROGRADE PYELOGRAM AND URETERAL STENT INSERTION
Anesthesia: General | Site: Ureter | Laterality: Right

## 2019-05-08 MED ORDER — ACETAMINOPHEN 10 MG/ML IV SOLN
INTRAVENOUS | Status: AC
  Filled 2019-05-08: qty 100

## 2019-05-08 MED ORDER — STERILE WATER FOR IRRIGATION IR SOLN
Status: DC | PRN
  Administered 2019-05-08: 10 mL

## 2019-05-08 MED ORDER — LACTATED RINGERS IV SOLN
INTRAVENOUS | Status: DC | PRN
  Administered 2019-05-08: 21:00:00 via INTRAVENOUS

## 2019-05-08 MED ORDER — 0.9 % SODIUM CHLORIDE (POUR BTL) OPTIME
TOPICAL | Status: DC | PRN
  Administered 2019-05-08: 1000 mL

## 2019-05-08 MED ORDER — IOHEXOL 300 MG/ML  SOLN
INTRAMUSCULAR | Status: DC | PRN
  Administered 2019-05-08: 13:00:00 17 mL via URETHRAL

## 2019-05-08 MED ORDER — PROPOFOL 10 MG/ML IV BOLUS
INTRAVENOUS | Status: AC
  Filled 2019-05-08: qty 20

## 2019-05-08 MED ORDER — ONDANSETRON HCL 4 MG/2ML IJ SOLN: 4.0000 mg | Freq: Four times a day (QID) | INTRAMUSCULAR | Status: DC | PRN

## 2019-05-08 MED ORDER — FENTANYL CITRATE (PF) 100 MCG/2ML IJ SOLN
INTRAMUSCULAR | Status: DC | PRN
  Administered 2019-05-08: 50 ug via INTRAVENOUS

## 2019-05-08 MED ORDER — SENNA 8.6 MG PO TABS
1.0000 | ORAL_TABLET | Freq: Two times a day (BID) | ORAL | Status: DC
  Administered 2019-05-09 – 2019-05-12 (×7): 8.6 mg via ORAL
  Filled 2019-05-08 (×9): qty 1

## 2019-05-08 MED ORDER — DULOXETINE HCL 30 MG PO CPEP
30.0000 mg | ORAL_CAPSULE | Freq: Every day | ORAL | Status: DC
  Administered 2019-05-09 – 2019-05-13 (×5): 30 mg via ORAL
  Filled 2019-05-08 (×5): qty 1

## 2019-05-08 MED ORDER — ENOXAPARIN SODIUM 40 MG/0.4ML ~~LOC~~ SOLN
40.0000 mg | SUBCUTANEOUS | Status: DC
  Administered 2019-05-09 – 2019-05-12 (×4): 40 mg via SUBCUTANEOUS
  Filled 2019-05-08 (×4): qty 0.4

## 2019-05-08 MED ORDER — PANTOPRAZOLE SODIUM 40 MG PO TBEC
40.0000 mg | DELAYED_RELEASE_TABLET | Freq: Every day | ORAL | Status: DC
  Administered 2019-05-09 – 2019-05-13 (×5): 40 mg via ORAL
  Filled 2019-05-08 (×5): qty 1

## 2019-05-08 MED ORDER — ONDANSETRON HCL 4 MG/2ML IJ SOLN
4.0000 mg | Freq: Once | INTRAMUSCULAR | Status: AC
  Administered 2019-05-08: 4 mg via INTRAVENOUS
  Filled 2019-05-08: qty 2

## 2019-05-08 MED ORDER — TOPIRAMATE 100 MG PO TABS
100.0000 mg | ORAL_TABLET | Freq: Two times a day (BID) | ORAL | Status: DC
  Administered 2019-05-09 – 2019-05-13 (×9): 100 mg via ORAL
  Filled 2019-05-08 (×9): qty 1

## 2019-05-08 MED ORDER — SODIUM CHLORIDE 0.9 % IV BOLUS
1000.0000 mL | Freq: Once | INTRAVENOUS | Status: AC
  Administered 2019-05-08: 1000 mL via INTRAVENOUS

## 2019-05-08 MED ORDER — ALBUTEROL SULFATE HFA 108 (90 BASE) MCG/ACT IN AERS
INHALATION_SPRAY | RESPIRATORY_TRACT | Status: AC
  Filled 2019-05-08: qty 6.7

## 2019-05-08 MED ORDER — OXYCODONE HCL 5 MG PO TABS
5.0000 mg | ORAL_TABLET | ORAL | Status: DC | PRN
  Administered 2019-05-09 – 2019-05-13 (×10): 5 mg via ORAL
  Filled 2019-05-08 (×10): qty 1

## 2019-05-08 MED ORDER — ACETAMINOPHEN 325 MG PO TABS
650.0000 mg | ORAL_TABLET | Freq: Four times a day (QID) | ORAL | Status: DC | PRN
  Administered 2019-05-11 – 2019-05-12 (×3): 650 mg via ORAL
  Filled 2019-05-08 (×3): qty 2

## 2019-05-08 MED ORDER — DOCUSATE SODIUM 100 MG PO CAPS
100.0000 mg | ORAL_CAPSULE | Freq: Two times a day (BID) | ORAL | Status: DC
  Administered 2019-05-09 – 2019-05-12 (×7): 100 mg via ORAL
  Filled 2019-05-08 (×9): qty 1

## 2019-05-08 MED ORDER — MORPHINE SULFATE (PF) 4 MG/ML IV SOLN
4.0000 mg | Freq: Once | INTRAVENOUS | Status: AC
  Administered 2019-05-08: 19:00:00 4 mg via INTRAVENOUS
  Filled 2019-05-08: qty 1

## 2019-05-08 MED ORDER — ONDANSETRON HCL 4 MG PO TABS: 4.0000 mg | ORAL_TABLET | Freq: Four times a day (QID) | ORAL | Status: DC | PRN

## 2019-05-08 MED ORDER — KETOROLAC TROMETHAMINE 15 MG/ML IJ SOLN
15.0000 mg | Freq: Four times a day (QID) | INTRAMUSCULAR | Status: DC | PRN
  Administered 2019-05-09 – 2019-05-12 (×6): 15 mg via INTRAVENOUS
  Filled 2019-05-08 (×7): qty 1

## 2019-05-08 MED ORDER — FENTANYL CITRATE (PF) 100 MCG/2ML IJ SOLN
INTRAMUSCULAR | Status: AC
  Filled 2019-05-08: qty 2

## 2019-05-08 MED ORDER — ATORVASTATIN CALCIUM 10 MG PO TABS
10.0000 mg | ORAL_TABLET | Freq: Every day | ORAL | Status: DC
  Administered 2019-05-09 – 2019-05-11 (×3): 10 mg via ORAL
  Filled 2019-05-08 (×3): qty 1

## 2019-05-08 MED ORDER — GABAPENTIN 300 MG PO CAPS
300.0000 mg | ORAL_CAPSULE | Freq: Three times a day (TID) | ORAL | Status: DC
  Administered 2019-05-09 – 2019-05-13 (×13): 300 mg via ORAL
  Filled 2019-05-08 (×14): qty 1

## 2019-05-08 MED ORDER — IPRATROPIUM-ALBUTEROL 0.5-2.5 (3) MG/3ML IN SOLN
3.0000 mL | Freq: Four times a day (QID) | RESPIRATORY_TRACT | Status: DC
  Administered 2019-05-09: 3 mL via RESPIRATORY_TRACT
  Filled 2019-05-08 (×2): qty 3

## 2019-05-08 MED ORDER — POLYETHYLENE GLYCOL 3350 17 G PO PACK
17.0000 g | PACK | Freq: Every day | ORAL | Status: DC
  Administered 2019-05-09 – 2019-05-11 (×3): 17 g via ORAL
  Filled 2019-05-08 (×5): qty 1

## 2019-05-08 MED ORDER — SUCCINYLCHOLINE CHLORIDE 200 MG/10ML IV SOSY
PREFILLED_SYRINGE | INTRAVENOUS | Status: DC | PRN
  Administered 2019-05-08: 80 mg via INTRAVENOUS

## 2019-05-08 MED ORDER — SUCCINYLCHOLINE CHLORIDE 200 MG/10ML IV SOSY
PREFILLED_SYRINGE | INTRAVENOUS | Status: AC
  Filled 2019-05-08: qty 10

## 2019-05-08 MED ORDER — DEXAMETHASONE SODIUM PHOSPHATE 4 MG/ML IJ SOLN
INTRAMUSCULAR | Status: DC | PRN
  Administered 2019-05-08: 10 mg via INTRAVENOUS

## 2019-05-08 MED ORDER — SODIUM CHLORIDE 0.9 % IV SOLN
2.0000 g | Freq: Once | INTRAVENOUS | Status: AC
  Administered 2019-05-08: 2 g via INTRAVENOUS
  Filled 2019-05-08: qty 20

## 2019-05-08 MED ORDER — ALBUTEROL SULFATE HFA 108 (90 BASE) MCG/ACT IN AERS
INHALATION_SPRAY | RESPIRATORY_TRACT | Status: DC | PRN
  Administered 2019-05-08: 5 via RESPIRATORY_TRACT

## 2019-05-08 MED ORDER — ACETAMINOPHEN 10 MG/ML IV SOLN
INTRAVENOUS | Status: DC | PRN
  Administered 2019-05-08: 1000 mg via INTRAVENOUS

## 2019-05-08 MED ORDER — ACETAMINOPHEN 650 MG RE SUPP: 650.0000 mg | Freq: Four times a day (QID) | RECTAL | Status: DC | PRN

## 2019-05-08 MED ORDER — BISACODYL 10 MG RE SUPP: 10.0000 mg | Freq: Every day | RECTAL | Status: DC | PRN

## 2019-05-08 MED ORDER — SODIUM CHLORIDE 0.9 % IV SOLN
1.0000 g | INTRAVENOUS | Status: DC
  Filled 2019-05-08: qty 10

## 2019-05-08 MED ORDER — DEXAMETHASONE SODIUM PHOSPHATE 10 MG/ML IJ SOLN
INTRAMUSCULAR | Status: AC
  Filled 2019-05-08: qty 1

## 2019-05-08 MED ORDER — PROPOFOL 10 MG/ML IV BOLUS
INTRAVENOUS | Status: DC | PRN
  Administered 2019-05-08 (×3): 50 mg via INTRAVENOUS
  Administered 2019-05-08: 100 mg via INTRAVENOUS

## 2019-05-08 MED ORDER — SODIUM CHLORIDE 0.9 % IR SOLN
Status: DC | PRN
  Administered 2019-05-08: 3000 mL

## 2019-05-08 MED ORDER — ONDANSETRON HCL 4 MG/2ML IJ SOLN
INTRAMUSCULAR | Status: DC | PRN
  Administered 2019-05-08: 4 mg via INTRAVENOUS

## 2019-05-08 MED ORDER — METOPROLOL SUCCINATE ER 50 MG PO TB24
50.0000 mg | ORAL_TABLET | Freq: Every day | ORAL | Status: DC
  Administered 2019-05-09 – 2019-05-12 (×4): 50 mg via ORAL
  Filled 2019-05-08 (×4): qty 1

## 2019-05-08 MED ORDER — ONDANSETRON HCL 4 MG/2ML IJ SOLN
INTRAMUSCULAR | Status: AC
  Filled 2019-05-08: qty 2

## 2019-05-08 MED ORDER — AMLODIPINE BESYLATE 10 MG PO TABS
10.0000 mg | ORAL_TABLET | Freq: Every day | ORAL | Status: DC
  Administered 2019-05-09 – 2019-05-13 (×5): 10 mg via ORAL
  Filled 2019-05-08 (×6): qty 1

## 2019-05-08 SURGICAL SUPPLY — 21 items
BAG URO CATCHER STRL LF (MISCELLANEOUS) ×3 IMPLANT
BASKET ZERO TIP NITINOL 2.4FR (BASKET) IMPLANT
CATH INTERMIT  6FR 70CM (CATHETERS) ×3 IMPLANT
CLOTH BEACON ORANGE TIMEOUT ST (SAFETY) ×3 IMPLANT
GLOVE BIO SURGEON STRL SZ7 (GLOVE) ×3 IMPLANT
GLOVE BIOGEL M STRL SZ7.5 (GLOVE) ×3 IMPLANT
GLOVE BIOGEL PI IND STRL 7.0 (GLOVE) ×1 IMPLANT
GLOVE BIOGEL PI IND STRL 7.5 (GLOVE) ×1 IMPLANT
GLOVE BIOGEL PI INDICATOR 7.0 (GLOVE) ×2
GLOVE BIOGEL PI INDICATOR 7.5 (GLOVE) ×2
GOWN STRL REUS W/TWL XL LVL3 (GOWN DISPOSABLE) ×6 IMPLANT
GUIDEWIRE ANG ZIPWIRE 038X150 (WIRE) IMPLANT
GUIDEWIRE STR DUAL SENSOR (WIRE) ×3 IMPLANT
KIT TURNOVER KIT A (KITS) IMPLANT
MANIFOLD NEPTUNE II (INSTRUMENTS) ×3 IMPLANT
PACK CYSTO (CUSTOM PROCEDURE TRAY) ×3 IMPLANT
STENT URET 6FRX24 CONTOUR (STENTS) ×3 IMPLANT
TRAY FOL W/BAG SLVR 16FR STRL (SET/KITS/TRAYS/PACK) ×1 IMPLANT
TRAY FOLEY W/BAG SLVR 16FR LF (SET/KITS/TRAYS/PACK) ×2
TUBING CONNECTING 10 (TUBING) ×2 IMPLANT
TUBING CONNECTING 10' (TUBING) ×1

## 2019-05-08 NOTE — ED Notes (Signed)
Pt placed on 4L [place]. Pt is on 4L [place] at night. Pt states that Morphine will drop pts O2 saturation, pt was 88% on RA.

## 2019-05-08 NOTE — ED Triage Notes (Signed)
Pt states right flank pain. Pt states that she is concerned for UTI vs kidney stone. Pt states that she also has a headache. Pt states she took AZO and tylenol PTA.

## 2019-05-08 NOTE — Anesthesia Procedure Notes (Signed)
Procedure Name: Intubation Date/Time: 05/08/2019 9:35 PM Performed by: Claudia Desanctis, CRNA Pre-anesthesia Checklist: Patient identified, Emergency Drugs available, Suction available and Patient being monitored Patient Re-evaluated:Patient Re-evaluated prior to induction Oxygen Delivery Method: Circle system utilized Preoxygenation: Pre-oxygenation with 100% oxygen Induction Type: IV induction Ventilation: Mask ventilation without difficulty Laryngoscope Size: 2 and Miller Grade View: Grade I Tube type: Oral Tube size: 7.0 mm Number of attempts: 1 Airway Equipment and Method: Stylet Placement Confirmation: ETT inserted through vocal cords under direct vision,  positive ETCO2 and breath sounds checked- equal and bilateral Secured at: 22 cm Tube secured with: Tape Dental Injury: Teeth and Oropharynx as per pre-operative assessment

## 2019-05-08 NOTE — Anesthesia Preprocedure Evaluation (Addendum)
Anesthesia Evaluation  Patient identified by MRN, date of birth, ID band Patient awake    Reviewed: Allergy & Precautions, NPO status , Patient's Chart, lab work & pertinent test results  Airway Mallampati: II  TM Distance: >3 FB Neck ROM: Full    Dental  (+) Missing   Pulmonary sleep apnea, Continuous Positive Airway Pressure Ventilation and Oxygen sleep apnea ,    Pulmonary exam normal breath sounds clear to auscultation       Cardiovascular hypertension, Pt. on medications and Pt. on home beta blockers +CHF  Normal cardiovascular exam Rhythm:Regular Rate:Normal  ECG: SR  ECHO: Mild global reduction in LV systolic function; mild diastolic dysfunction; trace MR and TR.    Neuro/Psych  Headaches, Anxiety TIACVA, No Residual Symptoms    GI/Hepatic Neg liver ROS, hiatal hernia, GERD  Medicated and Controlled,  Endo/Other  negative endocrine ROS  Renal/GU Renal disease     Musculoskeletal  (+) Arthritis ,   Abdominal   Peds  Hematology HLD   Anesthesia Other Findings UTI, Hydronephrosis, Right Ureteral Stone  Reproductive/Obstetrics                            Anesthesia Physical Anesthesia Plan  ASA: IV and emergent  Anesthesia Plan: General   Post-op Pain Management:    Induction: Intravenous  PONV Risk Score and Plan: 3 and Ondansetron, Dexamethasone, Midazolam and Treatment may vary due to age or medical condition  Airway Management Planned: Oral ETT  Additional Equipment:   Intra-op Plan:   Post-operative Plan: Extubation in OR  Informed Consent: I have reviewed the patients History and Physical, chart, labs and discussed the procedure including the risks, benefits and alternatives for the proposed anesthesia with the patient or authorized representative who has indicated his/her understanding and acceptance.     Dental advisory given  Plan Discussed with:  CRNA  Anesthesia Plan Comments:        Anesthesia Quick Evaluation

## 2019-05-08 NOTE — Transfer of Care (Signed)
Immediate Anesthesia Transfer of Care Note  Patient: Ann [name]  Procedure(s) Performed: CYSTOSCOPY WITH RETROGRADE PYELOGRAM/URETERAL STENT PLACEMENT (Right Ureter)  Patient Location: PACU  Anesthesia Type:General  Level of Consciousness: awake and patient cooperative  Airway & Oxygen Therapy: Patient Spontanous Breathing and Patient connected to face mask  Post-op Assessment: Report given to RN and Post -op Vital signs reviewed and stable  Post vital signs: Reviewed and stable  Last Vitals:  Vitals Value Taken Time  BP 152/82 05/08/19 2209  Temp    Pulse 103 05/08/19 2211  Resp 12 05/08/19 2211  SpO2 96 % 05/08/19 2211  Vitals shown include unvalidated device data.  Last Pain:  Vitals:   05/08/19 2035  TempSrc: Oral  PainSc:          Complications: No apparent anesthesia complications

## 2019-05-08 NOTE — ED Notes (Signed)
Pt refused rectal temp. PA notified.

## 2019-05-08 NOTE — ED Provider Notes (Signed)
[facility] DEPT Provider Note   CSN: [id number] Arrival date & time: 05/08/19  1322   History   Chief Complaint Chief Complaint  Patient presents with   Flank Pain   HPI Ann [name] is a 63 y.o. female with past medical history significant for recurrent UTIs, frequent renal stones presents for evaluation of right-sided flank pain.  Symptoms have been going on x1 week.  She tried to treat at home for UTI with Azo.  Her symptoms are unresolved.  She has had some blood in her urine.  She has been taking Tylenol at home without relief of her symptoms.  Patient states this morning she had a fever of 103.0.  She has not taken any antipyretics since 8:00 this morning.  She has had a decreased appetite with persistent nausea over the last week.  Right flank pain radiates around to her right side.  States this feels similar to her prior history of stones.  She has also had chills.  No chest pain, shortness of breath, hemoptysis, diarrhea, constipation.  Patient states she has had some dysuria and frequency.  Denies additional aggrivating or alleviating factors.  Patient states she has had multiple stones that have needed stenting and lithotripsy in the past.  She is followed by [facility] however does not know what provider she sees there.   No COVID exposures.  History obtained from patient and past medical records.  No interpreter was used.     HPI  Past Medical History:  Diagnosis Date   Adenoma of colon    Allergy    seasonal   Anemia    with past pregnancy -not recent   Anxiety    Arthritis    Borderline diabetes    diet controlled - no meds   Chronic cystitis    Complication of anesthesia    02-20-2014 ([facility]) INTRA-OP RESPIRATORY FAILURE SECONDARY TO POSSIBLE MUCOUS ASPIRATION/  ALSO 06-06-2013 [facility]) POST-OP DESATURATION  EVEN USING CPAP, States some issues if Propofol given to rapidly.   Diverticulitis    Diverticulosis of colon     Dyspnea    only needs O2 at Night   Family history of adverse reaction to anesthesia    PER PT SISTER DIED AFTER GENERAL ANESTHESIA DUE TO UNDIAGNOSED OSA   GERD (gastroesophageal reflux disease)    H/O hiatal hernia    Headache(784.0)    migraines, decreased since turning 50's   History of chronic cough    "tight sounding cough"   History of esophageal dilatation    History of kidney stones    History of MRSA infection    3/ 2015  AXILLARY ABSCESS   History of recurrent UTIs    History of TIAs NO RESIDUAL   1980;  2005;   2008 PT STATES PER CT  SCARRING RIGHT SIDE OF BRAIN   Hyperlipidemia    Hypertension    Kidney disease    III   Neuromuscular disorder ([place])    HH   Neuropathy    pt denies this 07-05-2015   Nocturia    OSA on CPAP    PER SLEEP STUDY 09-08-2012  MODERATE OSA. not used in awhile, but thinks needs to start back using due to some weight gain.   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   GERD (gastroesophageal reflux disease) 06/03/2017   Anxiety 06/03/2017   Chronic combined systolic (congestive) and diastolic (congestive) heart failure ([place]) 06/03/2017   Hypokalemia 06/03/2017   Diverticulitis large intestine 06/03/2017   Lower abdominal pain 06/03/2017   Non-intractable vomiting with nausea    Abnormal CT of the abdomen    Generalized abdominal pain    LFT elevation     Diverticulitis of colon 05/18/2017   Symptomatic cholelithiasis 02/10/2017   Hypoxia 01/11/2017   Dyspnea 01/11/2017   Dysphagia 04/04/2015   History of esophageal stricture 04/04/2015   Morbid (severe) obesity due to excess calories ([place]) 04/01/2015   ACE-inhibitor cough 03/12/2015   Upper airway cough syndrome 03/12/2015   Laryngopharyngeal reflux (LPR) 03/12/2015   Elevated lipids 07/18/2014   Chest pain 07/18/2014   Internal hemorrhoids 04/25/2014   Ureteral stone with hydronephrosis 04/22/2014   Post-op pain 04/21/2014   Rectocele, grade 3 02/20/2014   Female rectocele without uterine prolapse 08/04/2013   Unspecified constipation 07/23/2013   Chronic respiratory failure with hypoxia ([place]) 06/07/2013   Cystocele 06/06/2013   Anosmia 01/25/2013   Unspecified nutritional deficiency    Polyneuropathy in other diseases classified elsewhere ([place])    OSA (obstructive sleep apnea)    Cough 07/27/2012   Essential hypertension     Past Surgical History:  Procedure Laterality Date   ANTERIOR AND POSTERIOR REPAIR N/A 06/06/2013   Procedure: Anterior vaginal vault repair, Sacrospinous ligament fixation with UPHOLD lite, Kelly plication, Sacrospinous mesh fixation, Solyx transurethral sling;  Surgeon: Ailene Rud, MD;  Location: [place];  Service: Urology;  Laterality: N/A;   CHOLECYSTECTOMY N/A 02/10/2017   Procedure: LAPAROSCOPIC CHOLECYSTECTOMY;  Surgeon: Coralie Keens, MD;  Location: [place];  Service: General;  Laterality: N/A;   COLECTOMY WITH COLOSTOMY CREATION/HARTMANN PROCEDURE N/A 06/11/2017   Procedure: OPEN SIGMOID COLECTOMY;  Surgeon: [name], MD;  [facility];  Service: General;  Laterality: N/A;   COLONOSCOPY  08/2016   polyps - repeat 5 years Armbruster   CYSTOSCOPY W/ URETERAL STENT PLACEMENT Right 04/21/2014   Procedure: CYSTOSCOPY WITH RETROGRADE PYELOGRAM/URETERAL STENT PLACEMENT;  Surgeon: Ailene Rud, MD;  [facility];  Service: Urology;  Laterality: Right;   CYSTOSCOPY W/ URETERAL STENT PLACEMENT Right 11/21/2014   Procedure: CYSTOSCOPY WITH RETROGRADE PYELOGRAM, URETEROSCOPY WITH STONE REMOVAL, URETERAL STENT PLACEMENT;  Surgeon: Carolan Clines, MD;  [facility];  Service: Urology;  Laterality: Right;   CYSTOSCOPY W/ URETERAL STENT PLACEMENT Left 02/01/2016   Procedure: CYSTO, LEFT URETEROSCOPY, LEFT RETROGRADE AND LEFT URETERAL STENT PLACEMENT;  Surgeon: Carolan Clines, MD;  [facility];  Service: Urology;  Laterality: Left;   CYSTOSCOPY WITH RETROGRADE PYELOGRAM, URETEROSCOPY AND STENT PLACEMENT Right 06/05/2014   Procedure: CYSTOSCOPY WITH RIGHT RETROGRADE PYELOGRAM, RIGHT URETEROSCOPY, STONE EXTRACTION AND RIGHT DOUBLE J STENT PLACEMENT;  Surgeon: Ailene Rud, MD;  Location: [facility];  Service: Urology;  Laterality: Right;   ESOPHAGEAL DILATION  X2  LAST ONE  JUNE 2014   has had 9 of these  ESOPHAGEAL MANOMETRY N/A 09/10/2015   Procedure: ESOPHAGEAL MANOMETRY (EM);  Surgeon: Manus Gunning, MD;  [facility];  Service: Gastroenterology;  Laterality: N/A;   FRACTURE SURGERY  09/2018   broken nose no surgery   HOLMIUM LASER APPLICATION Right [id number]   Procedure: HOLMIUM LASER OF STONE ;  Surgeon: Ailene Rud, MD;  Location: [facility];  Service: Urology;  Laterality: Right;   KNEE ARTHROSCOPY Bilateral 2008   x 2   LAPAROSCOPIC CHOLECYSTECTOMY  02/10/2017   MULTIPLE TOOTH EXTRACTIONS     past hx   RECTOCELE REPAIR N/A 02/20/2014   Procedure: POSTERIOR REPAIR (RECTOCELE) WITH VAGINAL VAULT REPAIR WITH ACELL GRAFT PLACEMENT, PERINEOPLASTY, ENTEROCELE REPAIR;  Surgeon: Ailene Rud, MD;  [facility];  Service: Urology;  Laterality: N/A;   TOTAL KNEE ARTHROPLASTY Left 03/28/2019   Procedure: LEFT TOTAL KNEE ARTHROPLASTY;  Surgeon: Frederik Pear, MD;  [facility];  Service: Orthopedics;  Laterality: Left;   TUBAL LIGATION  1987   UPPER GASTROINTESTINAL ENDOSCOPY  07/29/2018   Armbruster - need to repeat per MD   WISDOM TOOTH EXTRACTION       OB History   No obstetric history on file.      Home Medications    Prior to Admission medications   Medication Sig Start Date End Date Taking? Authorizing Provider  amLODipine (NORVASC) 10 MG tablet Take 10 mg by mouth daily.     [provider]  aspirin EC 81 MG tablet Take 1 tablet (81 mg total) by mouth 2 (two) times daily. Patient not taking: Reported on 05/04/2019 03/28/19   Leighton Parody, PA-C  atorvastatin (LIPITOR) 20 MG tablet Take 10 mg by mouth at bedtime.  05/15/17   [provider]  budesonide-formoterol (SYMBICORT) 160-4.5 MCG/ACT inhaler Inhale 2 puffs into the lungs 2 (two) times daily as needed (Shortness of breath).  07/13/18 07/13/19  [provider]  CELEBREX 200 MG capsule Take 200 mg by mouth 2 (two) times daily. 03/30/19   [provider]  chlorpheniramine (CHLOR-TRIMETON) 4 MG tablet Take 1 tablet (4 mg total) by mouth at bedtime. 03/08/15   Chesley Mires, MD  clonazePAM (KLONOPIN) 0.5 MG tablet Take 0.5 mg by mouth 2 (two) times daily as needed for anxiety.     [provider]  DULoxetine (CYMBALTA) 30 MG capsule Take 30 mg by mouth daily. 03/21/19   [provider]  gabapentin (NEURONTIN) 300 MG capsule Take 300 mg by mouth 3 (three) times daily. 03/30/19   [provider]  hydrochlorothiazide (HYDRODIURIL) 25 MG tablet Take 25 mg by mouth daily as needed (swelling).     [provider]  metoprolol succinate (TOPROL-XL) 50 MG 24 hr tablet Take 50 mg by mouth at bedtime.  08/08/15   [provider]  omeprazole (PRILOSEC) 40 MG capsule TAKE 1 CAPSULE(40 MG) BY MOUTH TWICE DAILY Patient taking differently: Take 40 mg by mouth 2 (two) times daily.  09/17/16   Armbruster, [name], MD  oxyCODONE-acetaminophen  (PERCOCET/ROXICET) 5-325 MG tablet Take 1 tablet by mouth every 4 (four) hours as needed for severe pain. 03/28/19   Leighton Parody, PA-C  OXYGEN Inhale 4 L into the lungs at bedtime.    [provider]  tiZANidine (ZANAFLEX) 2 MG tablet Take 1 tablet (2 mg total) by mouth every 6 (six) hours as needed. Patient taking differently: Take 2 mg by mouth every 8 (eight) hours as needed for muscle spasms.  03/28/19   Leighton Parody, PA-C  topiramate (TOPAMAX) 100 MG tablet Take 1 tablet (100 mg total) by mouth 2 (two) times daily. 01/06/19   Marcial Pacas, MD  traZODone (DESYREL) 50 MG tablet Take 50 mg by mouth at bedtime as needed for sleep.  05/20/18   [provider]    Family History Family History  Problem Relation Age of Onset   Colon cancer Father 26       died at 1   Heart disease Mother        died at 27   Heart attack Mother    Heart attack Sister        died at 76   Hypertension Brother    Heart attack Cousin 56       with death   Heart attack Cousin 42       died with MI   Esophageal cancer Neg Hx    Rectal cancer Neg Hx    Stomach cancer Neg Hx    Colon polyps Neg Hx     Social History Social History   Tobacco Use   Smoking status: Never Smoker   Smokeless tobacco: Never Used  Substance Use Topics   Alcohol use: No    Alcohol/week: 0.0 standard drinks   Drug use: No    Allergies   Lisinopril, Lidocaine, Doxycycline, and Metronidazole   Review of Systems Review of Systems  Constitutional: Negative.   HENT: Negative.   Respiratory: Negative.   Cardiovascular: Negative.   Gastrointestinal: Positive for abdominal pain (Right groin pain) and nausea. Negative for abdominal distention.  Genitourinary: Positive for flank pain and hematuria.  Musculoskeletal: Negative for gait problem.  Skin: Negative.   Neurological: Negative.   All other systems reviewed and are negative.  Physical Exam Updated Vital Signs BP (!) 149/89 (BP  Location: Right Arm)    Pulse 95    Temp 98.8 F (37.1 C) (Oral)    Resp 16    SpO2 97%   Physical Exam Vitals signs and nursing note reviewed.  Constitutional:      General: She is not in acute distress.    Appearance: She is well-developed. She is not ill-appearing, toxic-appearing or diaphoretic.  HENT:     Head: Normocephalic and atraumatic.     Nose: Nose normal.     Mouth/Throat:     Mouth: Mucous membranes are moist.     Pharynx: Oropharynx is clear.  Eyes:     Pupils: Pupils are equal, round, and reactive to light.  Neck:     Musculoskeletal: Normal range of motion.  Cardiovascular:     Rate and Rhythm: Normal rate.     Pulses: Normal pulses.     Heart sounds: Normal heart sounds.  Pulmonary:     Effort: Pulmonary effort is normal. No respiratory distress.     Breath sounds: Normal breath sounds.  Abdominal:     General: Bowel sounds are normal. There is no distension.     Palpations: Abdomen is soft. There is no mass.     Tenderness: There is no abdominal tenderness. There is right CVA tenderness. There is no left CVA tenderness, guarding or rebound.     Hernia: No hernia is present.  Musculoskeletal: Normal range of motion.     Comments: Moves all 4 extremities without difficulty.  Skin:    General: Skin is warm and dry.     Comments: No rashes or lesions.  Neurological:     Mental  Status: She is alert.     Comments: Cranial nerves II through XII grossly intact.    ED Treatments / Results  Labs (all labs ordered are listed, but only abnormal results are displayed) Labs Reviewed  URINALYSIS, ROUTINE W REFLEX MICROSCOPIC - Abnormal; Notable for the following components:      Result Value   Color, Urine AMBER (*)    APPearance CLOUDY (*)    Hgb urine dipstick SMALL (*)    Protein, ur 100 (*)    Nitrite POSITIVE (*)    Leukocytes,Ua MODERATE (*)    WBC, UA >50 (*)    Bacteria, UA MANY (*)    All other components within normal limits  BASIC METABOLIC PANEL  - Abnormal; Notable for the following components:   CO2 19 (*)    Glucose, Bld 130 (*)    Creatinine, Ser 1.11 (*)    Calcium 8.7 (*)    GFR calc non Af Amer 54 (*)    All other components within normal limits  CBC - Abnormal; Notable for the following components:   WBC 13.6 (*)    MCV 101.7 (*)    All other components within normal limits  URINE CULTURE  SARS CORONAVIRUS 2 BY RT PCR (HOSPITAL ORDER, [facility])  CULTURE, BLOOD (ROUTINE X 2)  CULTURE, BLOOD (ROUTINE X 2)  HCG, QUANTITATIVE, PREGNANCY  LACTIC ACID, PLASMA  LACTIC ACID, PLASMA    EKG None  Radiology Ct Renal Stone Study  Result Date: 05/08/2019 CLINICAL DATA:  Right flank pain. Concern for UTI versus kidney stone. Previous history of a kidney stone. EXAM: CT ABDOMEN AND PELVIS WITHOUT CONTRAST TECHNIQUE: Multidetector CT imaging of the abdomen and pelvis was performed following the standard protocol without IV contrast. COMPARISON:  08/06/2017. FINDINGS: Lower chest: Clear lung bases.  Heart normal in size. Hepatobiliary: Decreased attenuation of the liver consistent with fatty infiltration. No liver mass or focal lesion. Status post cholecystectomy. No bile duct dilation. Pancreas: Unremarkable. No pancreatic ductal dilatation or surrounding inflammatory changes. Spleen: Normal in size without focal abnormality. Adrenals/Urinary Tract: No adrenal masses. There is marked right hydronephrosis with right perinephric stranding. Proximal to mid right ureter is moderately dilated. Findings are due to a stone or 2 contiguous stones, spanning a length of 5 mm, in the mid to distal ureter at the level of the pelvic brim. No additional ureteral stones. No left hydronephrosis. Normal left ureter. No renal masses. Bladder is decompressed but otherwise unremarkable. Stomach/Bowel: Small hiatal hernia.  Stomach otherwise unremarkable. Small bowel is normal caliber.  No wall thickening or inflammation. Bowel  anastomosis staple line noted along the rectosigmoid junction, stable. Colon is normal in caliber. A small portion of the transverse colon enters a small midline anterior abdominal wall hernia, new from the prior exam. Colon otherwise unremarkable. Normal appendix visualized. Vascular/Lymphatic: Mild aortic atherosclerosis. No enlarged lymph nodes. Reproductive: Uterus and bilateral adnexa are unremarkable. Other: No ascites. Musculoskeletal: No fracture or acute finding. No osteoblastic or osteolytic lesions. IMPRESSION: 1. 5 mm stone versus 2 directly adjacent stones in the mid to distal right ureter, at the pelvic brim, causes severe right hydronephrosis and moderate hydroureter. 2. No other acute findings within the abdomen or pelvis. 3. Small midline hernia containing a knuckle of the transverse colon, but without evidence of obstruction, incarceration or strangulation. This is new since the prior CT. 4. No intrarenal stones. 5. Hepatic steatosis.  Mild aortic atherosclerosis. Electronically Signed   By: Shanon Brow  Ormond M.D.   On: 05/08/2019 16:15   Procedures Procedures (including critical care time)  Medications Ordered in ED Medications  sodium chloride 0.9 % bolus 1,000 mL (has no administration in time range)  morphine 4 MG/ML injection 4 mg (has no administration in time range)  ondansetron (ZOFRAN) injection 4 mg (has no administration in time range)  cefTRIAXone (ROCEPHIN) 2 g in sodium chloride 0.9 % 100 mL IVPB (has no administration in time range)   Initial Impression / Assessment and Plan / ED Course  I have reviewed the triage vital signs and the nursing notes.  Pertinent labs & imaging results that were available during my care of the patient were reviewed by me and considered in my medical decision making (see chart for details).  63 year old female presents for evaluation of flank pain and urinary symptoms.  She is afebrile at 98.8 here, without tachycardia, tachypnea or hypoxia.   She does not appear septic however appears to not feel well.  Labs and imaging obtained from triage which shows grossly positive UTI as well as CT scan showing 5 mm stone with severe right sided hydronephrosis.  Will obtained Covid, IV fluids, pain management, consult urology. Will give Rocephin for UTI/Pyelonephritis.  Labs and imaging personally reviewed and interpreted.  1900: Consulted with Dr. Junious Silk with Urology who recommends stenting tonight, patient to remain NPO and hospitalist admission for pyelonephritis for continued IV antibiotics.  1920: Consulted with Dr. Jonnie Finner with [facility] who will evaluate patient for admission. VS no tachycardiac, tachypnea or hypoxia.  COVID pending at admission.   The patient appears reasonably stabilized for admission considering the current resources, flow, and capabilities available in the ED at this time, and I doubt any other [facility] requiring further screening and/or treatment in the ED prior to admission.      Final Clinical Impressions(s) / ED Diagnoses   Final diagnoses:  Pyelonephritis  Right ureteral stone  Hydronephrosis with urinary obstruction due to ureteral calculus    ED Discharge Orders    None       Roshni Burbano A, PA-C 05/08/19 2011    Hayden Rasmussen, MD 05/09/19 1113

## 2019-05-08 NOTE — Op Note (Signed)
Preoperative diagnosis: Right ureteral stone, right hydronephrosis, UTI, fever Postoperative diagnosis: Same  Procedure: Cystoscopy with right retrograde pyelogram, right ureteral stent placement and Foley catheter placement  Surgeon: Junious Silk  Anesthesia: General  Indication for procedure: Ms. Bogdanowicz is a 63 year old female with a history of kidney stones.  She had fever and right flank pain and CT scan revealed a mid right ureteral stone with moderate to severe proximal hydroureteronephrosis.  She had temperature here of 100.7, elevated white count and UA consistent with UTI and was brought for urgent stent.  Findings: Cystoscopy was unremarkable with no stone or foreign body in the bladder.  Right retrograde pyelogram-this outlined a single ureter single collecting system unit with a filling defect and transition point in the mid ureter with severe dilation of the proximal ureter renal pelvis and collecting system.    Description of procedure: After consent was obtained the patient brought to the operating room.  After adequate anesthesia she was placed in lithotomy position and prepped and draped in the usual sterile fashion.  A timeout was performed to confirm the patient and procedure.  The cystoscope was passed per urethra and the bladder inspected.  The right ureteral orifice was cannulated with a 5 [place] open-ended catheter and retrograde injection of contrast was performed.  I could only get contrast into the proximal ureter because it was so dilated.  I passed a wire and it coiled and I was not sure if I was in the collecting system or if there was tortuosity of the proximal ureter.  Therefore advanced to the 5 [place] open-ended catheter up toward the proximal ureter and remove the wire.  There was a good hydronephrotic drip.  Contrast was injected again which outlined the dilated collecting system and renal pelvis confirming wire placement in the collecting system.  The wire was replaced  and the 5 [place] open-ended catheter removed.  A 6 x 24 cm stent was advanced.  The wire was removed with a good coil seen in the renal pelvis and a good coil in the bladder.  The stent appeared to be draining well.  The scope was removed and a 29 [place] Foley was placed in left to gravity drainage to max drain the system overnight.  She was awakened and taken to the recovery room in stable condition.  Complications: None  Blood loss: Minimal  Specimens: None  Drains: 6 x 24 cm right ureteral stent, Foley catheter  Disposition: Patient stable to PACU.  She can continue IV antibiotics until cultures are none and be discharged on p.o. antibiotics.  I will set her up for right ureteroscopy in 2 to 3 weeks.

## 2019-05-08 NOTE — Discharge Instructions (Signed)

## 2019-05-08 NOTE — Consult Note (Signed)
Consultation: Right ureteral stone, urinary tract infection, right hydronephrosis Requested by: Dr. Rebeca Alert. [place]   History of Present Illness: Ann [name] is a 63 year old female well-known to our practice with a history of kidney stones.  She has had a couple week history of flank pain which intensified today with nausea and fevers and chills this morning.  She reports a 103 fever at home.  Although she has been afebrile here white count is 13.6 and her UA is positive for nitrite, leukocyte esterase, many bacteria greater than 50 white cells and 21-50 red cells.  Her lactic acid looked normal and her vitals were stable.  Her temperature was 100.7 when she got to the floor.  Past Medical History:  Diagnosis Date  . Adenoma of colon   . Allergy    seasonal  . Anemia    with past pregnancy -not recent  . Anxiety   . Arthritis   . Borderline diabetes    diet controlled - no meds  . Chronic cystitis   . Complication of anesthesia    02-20-2014 ([facility]) INTRA-OP RESPIRATORY FAILURE SECONDARY TO POSSIBLE MUCOUS ASPIRATION/  ALSO 06-06-2013 [facility]) POST-OP DESATURATION  EVEN USING CPAP, States some issues if Propofol given to rapidly.  . Diverticulitis   . Diverticulosis of colon   . Dyspnea    only needs O2 at Night  . Family history of adverse reaction to anesthesia    PER PT SISTER DIED AFTER GENERAL ANESTHESIA DUE TO UNDIAGNOSED OSA  . GERD (gastroesophageal reflux disease)   . H/O hiatal hernia   . Headache(784.0)    migraines, decreased since turning 50's  . History of chronic cough    "tight sounding cough"  . History of esophageal dilatation   . History of kidney stones   . History of MRSA infection    3/ 2015  AXILLARY ABSCESS  . History of recurrent UTIs   . History of TIAs NO RESIDUAL   1980;  2005;   2008 PT STATES PER CT  SCARRING RIGHT SIDE OF BRAIN  . Hyperlipidemia   . Hypertension   . Kidney disease    III  . Neuromuscular disorder ([place])    HH  . Neuropathy     pt denies this 07-05-2015  . Nocturia   . OSA on CPAP    PER SLEEP STUDY 09-08-2012  MODERATE OSA. not used in awhile, but thinks needs to start back using due to some weight gain.  . Osteoarthritis of knee    Right  . Sleep apnea    uses CPAP most nights  . Stroke [facility]) [phone]   TIA'S slight weakness on lt leg  . SUI (stress urinary incontinence, female)   . SVD (spontaneous vaginal delivery)    x 3   Past Surgical History:  Procedure Laterality Date  . ANTERIOR AND POSTERIOR REPAIR N/A 06/06/2013   Procedure: Anterior vaginal vault repair, Sacrospinous ligament fixation with UPHOLD lite, Kelly plication, Sacrospinous mesh fixation, Solyx transurethral sling;  Surgeon: Ailene Rud, MD;  Location: [facility];  Service: Urology;  Laterality: N/A;  . CHOLECYSTECTOMY N/A 02/10/2017   Procedure: LAPAROSCOPIC CHOLECYSTECTOMY;  Surgeon: Coralie Keens, MD;  Location: [place];  Service: General;  Laterality: N/A;  . COLECTOMY WITH COLOSTOMY CREATION/HARTMANN PROCEDURE N/A 06/11/2017   Procedure: OPEN SIGMOID COLECTOMY;  Surgeon: [name], MD;  Location: [facility];  Service: General;  Laterality: N/A;  . COLONOSCOPY  08/2016   polyps - repeat 5 years [facility]  .  CYSTOSCOPY W/ URETERAL STENT PLACEMENT Right 04/21/2014   Procedure: CYSTOSCOPY WITH RETROGRADE PYELOGRAM/URETERAL STENT PLACEMENT;  Surgeon: Ailene Rud, MD;  Location: [facility];  Service: Urology;  Laterality: Right;  . CYSTOSCOPY W/ URETERAL STENT PLACEMENT Right 11/21/2014   Procedure: CYSTOSCOPY WITH RETROGRADE PYELOGRAM, URETEROSCOPY WITH STONE REMOVAL, URETERAL STENT PLACEMENT;  Surgeon: Carolan Clines, MD;  Location: [facility];  Service: Urology;  Laterality: Right;  . CYSTOSCOPY W/ URETERAL STENT PLACEMENT Left 02/01/2016   Procedure: CYSTO, LEFT URETEROSCOPY, LEFT RETROGRADE AND LEFT URETERAL STENT PLACEMENT;  Surgeon: Carolan Clines, MD;  Location: [facility];  Service: Urology;   Laterality: Left;  . CYSTOSCOPY WITH RETROGRADE PYELOGRAM, URETEROSCOPY AND STENT PLACEMENT Right 06/05/2014   Procedure: CYSTOSCOPY WITH RIGHT RETROGRADE PYELOGRAM, RIGHT URETEROSCOPY, STONE EXTRACTION AND RIGHT DOUBLE J STENT PLACEMENT;  Surgeon: Ailene Rud, MD;  Location: [facility];  Service: Urology;  Laterality: Right;  . ESOPHAGEAL DILATION  X2  LAST ONE  JUNE 2014   has had 9 of these  . ESOPHAGEAL MANOMETRY N/A 09/10/2015   Procedure: ESOPHAGEAL MANOMETRY (EM);  Surgeon: Manus Gunning, MD;  [facility];  Service: Gastroenterology;  Laterality: N/A;  . FRACTURE SURGERY  09/2018   broken nose no surgery  . HOLMIUM LASER APPLICATION Right [id number]   Procedure: HOLMIUM LASER OF STONE ;  Surgeon: Ailene Rud, MD;  Location: [facility];  Service: Urology;  Laterality: Right;  . KNEE ARTHROSCOPY Bilateral 2008   x 2  . LAPAROSCOPIC CHOLECYSTECTOMY  02/10/2017  . MULTIPLE TOOTH EXTRACTIONS     past hx  . RECTOCELE REPAIR N/A 02/20/2014   Procedure: POSTERIOR REPAIR (RECTOCELE) WITH VAGINAL VAULT REPAIR WITH ACELL GRAFT PLACEMENT, PERINEOPLASTY, ENTEROCELE REPAIR;  Surgeon: Ailene Rud, MD;  Location: [facility];  Service: Urology;  Laterality: N/A;  . TOTAL KNEE ARTHROPLASTY Left 03/28/2019   Procedure: LEFT TOTAL KNEE ARTHROPLASTY;  Surgeon: Frederik Pear, MD;  Location: [facility];  Service: Orthopedics;  Laterality: Left;  . TUBAL LIGATION  1987  . UPPER GASTROINTESTINAL ENDOSCOPY  07/29/2018   [facility] - need to repeat per MD  . Arnetha Courser TOOTH EXTRACTION      Home Medications:  Medications Prior to Admission  Medication Sig Dispense Refill Last Dose  . amLODipine (NORVASC) 10 MG tablet Take 10 mg by mouth daily.      Marland Kitchen aspirin EC 81 MG tablet Take 1 tablet (81 mg total) by mouth 2 (two) times daily. (Patient not taking: Reported on 05/04/2019) 60 tablet 0   . atorvastatin (LIPITOR) 20 MG tablet Take 10 mg by  mouth at bedtime.   3   . budesonide-formoterol (SYMBICORT) 160-4.5 MCG/ACT inhaler Inhale 2 puffs into the lungs 2 (two) times daily as needed (Shortness of breath).      . CELEBREX 200 MG capsule Take 200 mg by mouth 2 (two) times daily.     . chlorpheniramine (CHLOR-TRIMETON) 4 MG tablet Take 1 tablet (4 mg total) by mouth at bedtime. 14 tablet 0   . clonazePAM (KLONOPIN) 0.5 MG tablet Take 0.5 mg by mouth 2 (two) times daily as needed for anxiety.      . DULoxetine (CYMBALTA) 30 MG capsule Take 30 mg by mouth daily.     Marland Kitchen gabapentin (NEURONTIN) 300 MG capsule Take 300 mg by mouth 3 (three) times daily.     . hydrochlorothiazide (HYDRODIURIL) 25 MG tablet Take 25 mg by mouth daily as needed (swelling).      Marland Kitchen  metoprolol succinate (TOPROL-XL) 50 MG 24 hr tablet Take 50 mg by mouth at bedtime.   3   . omeprazole (PRILOSEC) 40 MG capsule TAKE 1 CAPSULE(40 MG) BY MOUTH TWICE DAILY (Patient taking differently: Take 40 mg by mouth 2 (two) times daily. ) 120 capsule 0   . oxyCODONE-acetaminophen (PERCOCET/ROXICET) 5-325 MG tablet Take 1 tablet by mouth every 4 (four) hours as needed for severe pain. 30 tablet 0   . OXYGEN Inhale 4 L into the lungs at bedtime.     Marland Kitchen tiZANidine (ZANAFLEX) 2 MG tablet Take 1 tablet (2 mg total) by mouth every 6 (six) hours as needed. (Patient taking differently: Take 2 mg by mouth every 8 (eight) hours as needed for muscle spasms. ) 60 tablet 0   . topiramate (TOPAMAX) 100 MG tablet Take 1 tablet (100 mg total) by mouth 2 (two) times daily. 60 tablet 11   . traZODone (DESYREL) 50 MG tablet Take 50 mg by mouth at bedtime as needed for sleep.       Allergies:  Allergies  Allergen Reactions  . Lisinopril Cough  . Lidocaine     Increase blood pressure  . Doxycycline Diarrhea    Severe headaches  . Metronidazole Other (See Comments)    Severe headaches BUT CAN BE TOLERATED    Family History  Problem Relation Age of Onset  . Colon cancer Father 39       died at 54   . Heart disease Mother        died at 59  . Heart attack Mother   . Heart attack Sister        died at 29  . Hypertension Brother   . Heart attack Cousin 11       with death  . Heart attack Cousin 14       died with MI  . Esophageal cancer Neg Hx   . Rectal cancer Neg Hx   . Stomach cancer Neg Hx   . Colon polyps Neg Hx    Social History:  reports that she has never smoked. She has never used smokeless tobacco. She reports that she does not drink alcohol or use drugs.  ROS: A complete review of systems was performed.  All systems are negative except for pertinent findings as noted. Review of Systems  Constitutional: Positive for malaise/fatigue.  All other systems reviewed and are negative.    Physical Exam:  Vital signs in last 24 hours: Temp:  [98.7 F (37.1 C)-100.7 F (38.2 C)] 100.7 F (38.2 C) (11/01 2035) Pulse Rate:  [87-95] 87 (11/01 2035) Resp:  [16-18] 18 (11/01 2035) BP: (127-151)/(80-94) 127/81 (11/01 2035) SpO2:  [96 %-100 %] 100 % (11/01 2035) General:  Alert and oriented, No acute distress Neck: No JVD or lymphadenopathy Cardiovascular: Regular rate and rhythm Lungs: Regular rate and effort Abdomen: Soft, nontender, nondistended, no abdominal masses Back: No CVA tenderness Extremities: No edema Neurologic: Grossly intact  Laboratory Data:  Results for orders placed or performed during the hospital encounter of 05/08/19 (from the past 24 hour(s))  Urinalysis, Routine w reflex microscopic- may I&O cath if menses     Status: Abnormal   Collection Time: 05/08/19  1:35 PM  Result Value Ref Range   Color, Urine AMBER (A) YELLOW   APPearance CLOUDY (A) CLEAR   Specific Gravity, Urine 1.018 1.005 - 1.030   pH 6.0 5.0 - 8.0   Glucose, UA NEGATIVE NEGATIVE mg/dL   Hgb urine dipstick SMALL (  A) NEGATIVE   Bilirubin Urine NEGATIVE NEGATIVE   Ketones, ur NEGATIVE NEGATIVE mg/dL   Protein, ur 100 (A) NEGATIVE mg/dL   Nitrite POSITIVE (A) NEGATIVE    Leukocytes,Ua MODERATE (A) NEGATIVE   RBC / HPF 21-50 0 - 5 RBC/hpf   WBC, UA >50 (H) 0 - 5 WBC/hpf   Bacteria, UA MANY (A) NONE SEEN   Squamous Epithelial / LPF 6-10 0 - 5   WBC Clumps PRESENT    Mucus PRESENT    Hyaline Casts, UA PRESENT   Basic metabolic panel     Status: Abnormal   Collection Time: 05/08/19  2:43 PM  Result Value Ref Range   Sodium 137 135 - 145 mmol/L   Potassium 3.7 3.5 - 5.1 mmol/L   Chloride 108 98 - 111 mmol/L   CO2 19 (L) 22 - 32 mmol/L   Glucose, Bld 130 (H) 70 - 99 mg/dL   BUN 19 6 - 20 mg/dL   Creatinine, Ser 1.11 (H) 0.44 - 1.00 mg/dL   Calcium 8.7 (L) 8.9 - 10.3 mg/dL   GFR calc non Af Amer 54 (L) >60 mL/min   GFR calc Af Amer >60 >60 mL/min   Anion gap 10 5 - 15  CBC     Status: Abnormal   Collection Time: 05/08/19  2:43 PM  Result Value Ref Range   WBC 13.6 (H) 4.0 - 10.5 K/uL   RBC 4.11 3.87 - 5.11 MIL/uL   Hemoglobin 13.2 12.0 - 15.0 g/dL   HCT 41.8 36.0 - 46.0 %   MCV 101.7 (H) 80.0 - 100.0 fL   MCH 32.1 26.0 - 34.0 pg   MCHC 31.6 30.0 - 36.0 g/dL   RDW 13.8 11.5 - 15.5 %   Platelets 268 150 - 400 K/uL   nRBC 0.0 0.0 - 0.2 %  hCG, quantitative, pregnancy     Status: None   Collection Time: 05/08/19  2:43 PM  Result Value Ref Range   hCG, Beta Chain, Quant, S 1 <5 mIU/mL  Lactic acid, plasma     Status: None   Collection Time: 05/08/19  6:15 PM  Result Value Ref Range   Lactic Acid, Venous 1.0 0.5 - 1.9 mmol/L  SARS Coronavirus 2 by RT PCR (hospital order, performed in [place] hospital lab) Nasopharyngeal Nasopharyngeal Swab     Status: None   Collection Time: 05/08/19  7:14 PM   Specimen: Nasopharyngeal Swab  Result Value Ref Range   SARS Coronavirus 2 NEGATIVE NEGATIVE   Recent Results (from the past 240 hour(s))  SARS Coronavirus 2 by RT PCR (hospital order, performed in [place] hospital lab) Nasopharyngeal Nasopharyngeal Swab     Status: None   Collection Time: 05/08/19  7:14 PM   Specimen: Nasopharyngeal Swab   Result Value Ref Range Status   SARS Coronavirus 2 NEGATIVE NEGATIVE Final    Comment: (NOTE) If result is NEGATIVE SARS-CoV-2 target nucleic acids are NOT DETECTED. The SARS-CoV-2 RNA is generally detectable in upper and lower  respiratory specimens during the acute phase of infection. The lowest  concentration of SARS-CoV-2 viral copies this assay can detect is 250  copies / mL. A negative result does not preclude SARS-CoV-2 infection  and should not be used as the sole basis for treatment or other  patient management decisions.  A negative result may occur with  improper specimen collection / handling, submission of specimen other  than nasopharyngeal swab, presence of viral mutation(s) within the  areas  targeted by this assay, and inadequate number of viral copies  (<250 copies / mL). A negative result must be combined with clinical  observations, patient history, and epidemiological information. If result is POSITIVE SARS-CoV-2 target nucleic acids are DETECTED. The SARS-CoV-2 RNA is generally detectable in upper and lower  respiratory specimens dur ing the acute phase of infection.  Positive  results are indicative of active infection with SARS-CoV-2.  Clinical  correlation with patient history and other diagnostic information is  necessary to determine patient infection status.  Positive results do  not rule out bacterial infection or co-infection with other viruses. If result is PRESUMPTIVE POSTIVE SARS-CoV-2 nucleic acids MAY BE PRESENT.   A presumptive positive result was obtained on the submitted specimen  and confirmed on repeat testing.  While 2019 novel coronavirus  (SARS-CoV-2) nucleic acids may be present in the submitted sample  additional confirmatory testing may be necessary for epidemiological  and / or clinical management purposes  to differentiate between  SARS-CoV-2 and other Sarbecovirus currently known to infect humans.  If clinically indicated additional  testing with an alternate test  methodology [id number]) is advised. The SARS-CoV-2 RNA is generally  detectable in upper and lower respiratory sp ecimens during the acute  phase of infection. The expected result is Negative. Fact Sheet for Patients:  StrictlyIdeas.no Fact Sheet for Healthcare Providers: BankingDealers.co.za This test is not yet approved or cleared by the [place] FDA and has been authorized for detection and/or diagnosis of SARS-CoV-2 by FDA under an Emergency Use Authorization (EUA).  This EUA will remain in effect (meaning this test can be used) for the duration of the COVID-19 declaration under Section 564(b)(1) of the Act, 21 U.S.C. section 360bbb-3(b)(1), unless the authorization is terminated or revoked sooner. Performed at [facility], [place] [address] [place], [place] [address]    Creatinine: Recent Labs    05/08/19 1443  CREATININE 1.11*    Impression/Assessment/plan: Right ureteral stone, right hydronephrosis, UTI-she likely has pyelonephritis due to the obstruction and we went over the nature, potential benefits, risks and alternatives to cystoscopy with right retrograde pyelogram and right ureteral stent placement, including side effects of the proposed treatment, the likelihood of the patient achieving the goals of the procedure, and any potential problems that might occur during the procedure or recuperation.  We discussed she will need ureteroscopy in 2 to 3 weeks and the rationale for a staged procedure.  We discussed she would likely need to delay her right knee replacement.  All questions answered. Patient elects to proceed.  I should add we also went over stent pain and discomfort as she has had several stents before and has a lot of frequency and urgency.   Ann [name] 05/08/2019, 8:46 PM

## 2019-05-08 NOTE — ED Notes (Signed)
ED TO INPATIENT HANDOFF REPORT  Name/Age/Gender Ann [name] 63 y.o. female  Code Status Code Status History    Date Active Date Inactive Code Status Order ID Comments User Context   03/28/2019 1803 03/29/2019 2021 Full Code [id number]  Ann [name] Inpatient   08/09/2018 0615 08/12/2018 1738 Full Code [id number]  Ann Patience, MD ED   06/03/2017 0205 06/17/2017 1625 Full Code [id number]  Ann Costa, MD ED   05/18/2017 1555 05/28/2017 1319 Full Code [id number]  Ann Hazel, MD ED   02/10/2017 1130 02/11/2017 2011 Full Code [id number]  Ann Keens, MD Inpatient   01/11/2017 2046 01/16/2017 1825 Full Code [id number]  Ann Blaze, MD Inpatient   04/22/2014 1200 04/22/2014 1646 Full Code [id number]  Ann Rud, MD Inpatient   04/21/2014 2333 04/22/2014 1200 Full Code [id number]  Ann Rud, MD Inpatient   02/20/2014 1524 02/22/2014 1221 Full Code [id number]  Ann Rud, MD Inpatient   06/07/2013 0909 06/08/2013 1920 Full Code [id number]  Ann Brash, MD Inpatient   Advance Care Planning Activity      Home/SNF/Other Home  Chief Complaint uti?kidney stone?h'ache  Level of Care/Admitting Diagnosis ED Disposition    ED Disposition Condition [facility]: [place] [[id number]]  Level of Care: Med-Surg [16]  Covid Evaluation: Asymptomatic Screening Protocol (No Symptoms)  Diagnosis: Hydronephrosis, right [id number]  Admitting Physician: Ann [name] [id number]  Attending Physician: Ann [name], [place] [[id number]]  Estimated length of stay: past midnight tomorrow  Certification:: I certify this patient will need inpatient services for at least 2 midnights  PT Class (Do Not Modify): Inpatient [101]  PT Acc Code (Do Not Modify): Private [1]       Medical History Past Medical History:  Diagnosis Date  . Adenoma of colon   . Allergy    seasonal  . Anemia    with past pregnancy -not recent  . Anxiety    . Arthritis   . Borderline diabetes    diet controlled - no meds  . Chronic cystitis   . Complication of anesthesia    02-20-2014 ([facility]) INTRA-OP RESPIRATORY FAILURE SECONDARY TO POSSIBLE MUCOUS ASPIRATION/  ALSO 06-06-2013 [facility]) POST-OP DESATURATION  EVEN USING CPAP, States some issues if Propofol given to rapidly.  . Diverticulitis   . Diverticulosis of colon   . Dyspnea    only needs O2 at Night  . Family history of adverse reaction to anesthesia    PER PT SISTER DIED AFTER GENERAL ANESTHESIA DUE TO UNDIAGNOSED OSA  . GERD (gastroesophageal reflux disease)   . H/O hiatal hernia   . Headache(784.0)    migraines, decreased since turning 50's  . History of chronic cough    "tight sounding cough"  . History of esophageal dilatation   . History of kidney stones   . History of MRSA infection    3/ 2015  AXILLARY ABSCESS  . History of recurrent UTIs   . History of TIAs NO RESIDUAL   1980;  2005;   2008 PT STATES PER CT  SCARRING RIGHT SIDE OF BRAIN  . Hyperlipidemia   . Hypertension   . Kidney disease    III  . Neuromuscular disorder ([place])    HH  . Neuropathy    pt denies this 07-05-2015  . Nocturia   . OSA on CPAP    PER SLEEP STUDY 09-08-2012  MODERATE OSA. not used in awhile, but thinks needs to start back  using due to some weight gain.  . Osteoarthritis of knee    Right  . Sleep apnea    uses CPAP most nights  . Stroke [facility]) [phone]   TIA'S slight weakness on lt leg  . SUI (stress urinary incontinence, female)   . SVD (spontaneous vaginal delivery)    x 3    Allergies Allergies  Allergen Reactions  . Lisinopril Cough  . Lidocaine     Increase blood pressure  . Doxycycline Diarrhea    Severe headaches  . Metronidazole Other (See Comments)    Severe headaches BUT CAN BE TOLERATED    IV Location/Drains/Wounds Patient Lines/Drains/Airways Status   Active Line/Drains/Airways    Name:   Placement date:   Placement time:   Site:   Days:   Peripheral IV  05/08/19 Left;Upper Arm   05/08/19    1926    Arm   less than 1   Ureteral Drain/Stent Right ureter 4.8 Fr.   11/21/14    2100    Right ureter   1629   Ureteral Drain/Stent Left ureter 6 Fr.   02/01/16    1154    Left ureter   1192   Incision (Closed) 06/11/17 Abdomen   06/11/17    1637     696   Incision (Closed) 03/28/19 Knee Left   03/28/19    1453     41   Incision - 4 Ports Abdomen 1: Umbilicus 2: Mid;Upper 3: Right;Medial 4: Right;Lateral   02/10/17    -     817          Labs/Imaging Results for orders placed or performed during the hospital encounter of 05/08/19 (from the past 48 hour(s))  Urinalysis, Routine w reflex microscopic- may I&O cath if menses     Status: Abnormal   Collection Time: 05/08/19  1:35 PM  Result Value Ref Range   Color, Urine AMBER (A) YELLOW    Comment: BIOCHEMICALS MAY BE AFFECTED BY COLOR   APPearance CLOUDY (A) CLEAR   Specific Gravity, Urine 1.018 1.005 - 1.030   pH 6.0 5.0 - 8.0   Glucose, UA NEGATIVE NEGATIVE mg/dL   Hgb urine dipstick SMALL (A) NEGATIVE   Bilirubin Urine NEGATIVE NEGATIVE   Ketones, ur NEGATIVE NEGATIVE mg/dL   Protein, ur 100 (A) NEGATIVE mg/dL   Nitrite POSITIVE (A) NEGATIVE   Leukocytes,Ua MODERATE (A) NEGATIVE   RBC / HPF 21-50 0 - 5 RBC/hpf   WBC, UA >50 (H) 0 - 5 WBC/hpf   Bacteria, UA MANY (A) NONE SEEN   Squamous Epithelial / LPF 6-10 0 - 5   WBC Clumps PRESENT    Mucus PRESENT    Hyaline Casts, UA PRESENT     Comment: Performed at [facility], [place] [address], [place], [place] [id number]  Basic metabolic panel     Status: Abnormal   Collection Time: 05/08/19  2:43 PM  Result Value Ref Range   Sodium 137 135 - 145 mmol/L   Potassium 3.7 3.5 - 5.1 mmol/L   Chloride 108 98 - 111 mmol/L   CO2 19 (L) 22 - 32 mmol/L   Glucose, Bld 130 (H) 70 - 99 mg/dL   BUN 19 6 - 20 mg/dL   Creatinine, Ser 1.11 (H) 0.44 - 1.00 mg/dL   Calcium 8.7 (L) 8.9 - 10.3 mg/dL   GFR calc non Af Amer 54 (L) >60 mL/min    GFR calc Af Amer >60 >60 mL/min  Anion gap 10 5 - 15    Comment: Performed at [facility], [place] [address], [place], [place] [address]  CBC     Status: Abnormal   Collection Time: 05/08/19  2:43 PM  Result Value Ref Range   WBC 13.6 (H) 4.0 - 10.5 K/uL   RBC 4.11 3.87 - 5.11 MIL/uL   Hemoglobin 13.2 12.0 - 15.0 g/dL   HCT 41.8 36.0 - 46.0 %   MCV 101.7 (H) 80.0 - 100.0 fL   MCH 32.1 26.0 - 34.0 pg   MCHC 31.6 30.0 - 36.0 g/dL   RDW 13.8 11.5 - 15.5 %   Platelets 268 150 - 400 K/uL   nRBC 0.0 0.0 - 0.2 %    Comment: Performed at [facility], [place] [address] [place], [place] [address]  hCG, quantitative, pregnancy     Status: None   Collection Time: 05/08/19  2:43 PM  Result Value Ref Range   hCG, Beta Chain, Quant, S 1 <5 mIU/mL    Comment:          GEST. AGE      CONC.  (mIU/mL)   <=1 WEEK        5 - 50     2 WEEKS       50 - 500     3 WEEKS       100 - 10,000     4 WEEKS     1,000 - 30,000     5 WEEKS     3,500 - 115,000   6-8 WEEKS     12,000 - 270,000    12 WEEKS     15,000 - 220,000        FEMALE AND NON-PREGNANT FEMALE:     LESS THAN 5 mIU/mL Performed at [facility], [place] [address], [place], [place] [address]   Lactic acid, plasma     Status: None   Collection Time: 05/08/19  6:15 PM  Result Value Ref Range   Lactic Acid, Venous 1.0 0.5 - 1.9 mmol/L    Comment: Performed at [facility], [place] [address], [place], [place] [address]   Ct Renal Stone Study  Result Date: 05/08/2019 CLINICAL DATA:  Right flank pain. Concern for UTI versus kidney stone. Previous history of a kidney stone. EXAM: CT ABDOMEN AND PELVIS WITHOUT CONTRAST TECHNIQUE: Multidetector CT imaging of the abdomen and pelvis was performed following the standard protocol without IV contrast. COMPARISON:  08/06/2017. FINDINGS: Lower chest: Clear lung bases.  Heart normal in size. Hepatobiliary: Decreased attenuation of the liver  consistent with fatty infiltration. No liver mass or focal lesion. Status post cholecystectomy. No bile duct dilation. Pancreas: Unremarkable. No pancreatic ductal dilatation or surrounding inflammatory changes. Spleen: Normal in size without focal abnormality. Adrenals/Urinary Tract: No adrenal masses. There is marked right hydronephrosis with right perinephric stranding. Proximal to mid right ureter is moderately dilated. Findings are due to a stone or 2 contiguous stones, spanning a length of 5 mm, in the mid to distal ureter at the level of the pelvic brim. No additional ureteral stones. No left hydronephrosis. Normal left ureter. No renal masses. Bladder is decompressed but otherwise unremarkable. Stomach/Bowel: Small hiatal hernia.  Stomach otherwise unremarkable. Small bowel is normal caliber.  No wall thickening or inflammation. Bowel anastomosis staple line noted along the rectosigmoid junction, stable. Colon is normal in caliber. A small portion of the transverse colon enters a small midline anterior abdominal  wall hernia, new from the prior exam. Colon otherwise unremarkable. Normal appendix visualized. Vascular/Lymphatic: Mild aortic atherosclerosis. No enlarged lymph nodes. Reproductive: Uterus and bilateral adnexa are unremarkable. Other: No ascites. Musculoskeletal: No fracture or acute finding. No osteoblastic or osteolytic lesions. IMPRESSION: 1. 5 mm stone versus 2 directly adjacent stones in the mid to distal right ureter, at the pelvic brim, causes severe right hydronephrosis and moderate hydroureter. 2. No other acute findings within the abdomen or pelvis. 3. Small midline hernia containing a knuckle of the transverse colon, but without evidence of obstruction, incarceration or strangulation. This is new since the prior CT. 4. No intrarenal stones. 5. Hepatic steatosis.  Mild aortic atherosclerosis. Electronically Signed   By: Lajean Manes M.D.   On: 05/08/2019 16:15    Pending  Labs Unresulted Labs (From admission, onward)    Start     Ordered   05/08/19 1733  Blood culture (routine x 2)  BLOOD CULTURE X 2,   STAT     05/08/19 1732   05/08/19 1733  Lactic acid, plasma  Now then every 2 hours,   STAT     05/08/19 1732   05/08/19 1714  SARS Coronavirus 2 by RT PCR (hospital order, performed in [facility] hospital lab) Nasopharyngeal Nasopharyngeal Swab  (Symptomatic/High Risk of Exposure/Tier 1 Patients Labs with Precautions)  Once,   STAT    Question Answer Comment  Is this test for diagnosis or screening Screening   Symptomatic for COVID-19 as defined by CDC No   Hospitalized for COVID-19 No   Admitted to ICU for COVID-19 No   Previously tested for COVID-19 No   Resident in a congregate (group) care setting No   Employed in healthcare setting No   Pregnant No      05/08/19 1713   05/08/19 1336  Urine C&S  Once,   STAT     05/08/19 1335   Signed and Held  Basic metabolic panel  Tomorrow morning,   R     Signed and Held   Signed and Held  Magnesium  Add-on,   R     Signed and Held   Signed and Held  Phosphorus  Add-on,   R     Signed and Held   Signed and Held  CBC  Tomorrow morning,   R     Signed and Held          Vitals/Pain Today's Vitals   05/08/19 1755 05/08/19 1840 05/08/19 1936 05/08/19 1955  BP:  (!) 149/89 (!) 144/80   Pulse:  95 87   Resp:  16 18   Temp: 98.8 F (37.1 C)     TempSrc: Oral     SpO2:  97% 97%   PainSc:    7     Isolation Precautions No active isolations  Medications Medications  sodium chloride 0.9 % bolus 1,000 mL (1,000 mLs Intravenous New Bag/Given 05/08/19 1927)  morphine 4 MG/ML injection 4 mg (4 mg Intravenous Given 05/08/19 1928)  ondansetron (ZOFRAN) injection 4 mg (4 mg Intravenous Given 05/08/19 1928)  cefTRIAXone (ROCEPHIN) 2 g in sodium chloride 0.9 % 100 mL IVPB (2 g Intravenous New Bag/Given 05/08/19 1929)    Mobility walks

## 2019-05-08 NOTE — H&P (View-Only) (Signed)
Consultation: Right ureteral stone, urinary tract infection, right hydronephrosis Requested by: Dr. Rebeca Alert. Butler   History of Present Illness: Ann [name] is a 63 year old female well-known to our practice with a history of kidney stones.  She has had a couple week history of flank pain which intensified today with nausea and fevers and chills this morning.  She reports a 103 fever at home.  Although she has been afebrile here white count is 13.6 and her UA is positive for nitrite, leukocyte esterase, many bacteria greater than 50 white cells and 21-50 red cells.  Her lactic acid looked normal and her vitals were stable.  Her temperature was 100.7 when she got to the floor.  Past Medical History:  Diagnosis Date  . Adenoma of colon   . Allergy    seasonal  . Anemia    with past pregnancy -not recent  . Anxiety   . Arthritis   . Borderline diabetes    diet controlled - no meds  . Chronic cystitis   . Complication of anesthesia    02-20-2014 ([facility]) INTRA-OP RESPIRATORY FAILURE SECONDARY TO POSSIBLE MUCOUS ASPIRATION/  ALSO 06-06-2013 [facility]) POST-OP DESATURATION  EVEN USING CPAP, States some issues if Propofol given to rapidly.  . Diverticulitis   . Diverticulosis of colon   . Dyspnea    only needs O2 at Night  . Family history of adverse reaction to anesthesia    PER PT SISTER DIED AFTER GENERAL ANESTHESIA DUE TO UNDIAGNOSED OSA  . GERD (gastroesophageal reflux disease)   . H/O hiatal hernia   . Headache(784.0)    migraines, decreased since turning 50's  . History of chronic cough    "tight sounding cough"  . History of esophageal dilatation   . History of kidney stones   . History of MRSA infection    3/ 2015  AXILLARY ABSCESS  . History of recurrent UTIs   . History of TIAs NO RESIDUAL   1980;  2005;   2008 PT STATES PER CT  SCARRING RIGHT SIDE OF BRAIN  . Hyperlipidemia   . Hypertension   . Kidney disease    III  . Neuromuscular disorder ([place])    HH  . Neuropathy     pt denies this 07-05-2015  . Nocturia   . OSA on CPAP    PER SLEEP STUDY 09-08-2012  MODERATE OSA. not used in awhile, but thinks needs to start back using due to some weight gain.  . Osteoarthritis of knee    Right  . Sleep apnea    uses CPAP most nights  . Stroke [facility]) [phone]   TIA'S slight weakness on lt leg  . SUI (stress urinary incontinence, female)   . SVD (spontaneous vaginal delivery)    x 3   Past Surgical History:  Procedure Laterality Date  . ANTERIOR AND POSTERIOR REPAIR N/A 06/06/2013   Procedure: Anterior vaginal vault repair, Sacrospinous ligament fixation with UPHOLD lite, Kelly plication, Sacrospinous mesh fixation, Solyx transurethral sling;  Surgeon: Ailene Rud, MD;  Location: [facility];  Service: Urology;  Laterality: N/A;  . CHOLECYSTECTOMY N/A 02/10/2017   Procedure: LAPAROSCOPIC CHOLECYSTECTOMY;  Surgeon: Coralie Keens, MD;  Location: [place];  Service: General;  Laterality: N/A;  . COLECTOMY WITH COLOSTOMY CREATION/HARTMANN PROCEDURE N/A 06/11/2017   Procedure: OPEN SIGMOID COLECTOMY;  Surgeon: [name], MD;  Location: [facility];  Service: General;  Laterality: N/A;  . COLONOSCOPY  08/2016   polyps - repeat 5 years [facility]  .  CYSTOSCOPY W/ URETERAL STENT PLACEMENT Right 04/21/2014   Procedure: CYSTOSCOPY WITH RETROGRADE PYELOGRAM/URETERAL STENT PLACEMENT;  Surgeon: Ailene Rud, MD;  Location: [facility];  Service: Urology;  Laterality: Right;  . CYSTOSCOPY W/ URETERAL STENT PLACEMENT Right 11/21/2014   Procedure: CYSTOSCOPY WITH RETROGRADE PYELOGRAM, URETEROSCOPY WITH STONE REMOVAL, URETERAL STENT PLACEMENT;  Surgeon: Carolan Clines, MD;  Location: [facility];  Service: Urology;  Laterality: Right;  . CYSTOSCOPY W/ URETERAL STENT PLACEMENT Left 02/01/2016   Procedure: CYSTO, LEFT URETEROSCOPY, LEFT RETROGRADE AND LEFT URETERAL STENT PLACEMENT;  Surgeon: Carolan Clines, MD;  Location: [facility];  Service: Urology;   Laterality: Left;  . CYSTOSCOPY WITH RETROGRADE PYELOGRAM, URETEROSCOPY AND STENT PLACEMENT Right 06/05/2014   Procedure: CYSTOSCOPY WITH RIGHT RETROGRADE PYELOGRAM, RIGHT URETEROSCOPY, STONE EXTRACTION AND RIGHT DOUBLE J STENT PLACEMENT;  Surgeon: Ailene Rud, MD;  Location: [facility];  Service: Urology;  Laterality: Right;  . ESOPHAGEAL DILATION  X2  LAST ONE  JUNE 2014   has had 9 of these  . ESOPHAGEAL MANOMETRY N/A 09/10/2015   Procedure: ESOPHAGEAL MANOMETRY (EM);  Surgeon: Manus Gunning, MD;  Location: [facility];  Service: Gastroenterology;  Laterality: N/A;  . FRACTURE SURGERY  09/2018   broken nose no surgery  . HOLMIUM LASER APPLICATION Right [id number]   Procedure: HOLMIUM LASER OF STONE ;  Surgeon: Ailene Rud, MD;  Location[facility];  Service: Urology;  Laterality: Right;  . KNEE ARTHROSCOPY Bilateral 2008   x 2  . LAPAROSCOPIC CHOLECYSTECTOMY  02/10/2017  . MULTIPLE TOOTH EXTRACTIONS     past hx  . RECTOCELE REPAIR N/A 02/20/2014   Procedure: POSTERIOR REPAIR (RECTOCELE) WITH VAGINAL VAULT REPAIR WITH ACELL GRAFT PLACEMENT, PERINEOPLASTY, ENTEROCELE REPAIR;  Surgeon: Ailene Rud, MD;  Location: [facility];  Service: Urology;  Laterality: N/A;  . TOTAL KNEE ARTHROPLASTY Left 03/28/2019   Procedure: LEFT TOTAL KNEE ARTHROPLASTY;  Surgeon: Frederik Pear, MD;  Location: [facility];  Service: Orthopedics;  Laterality: Left;  . TUBAL LIGATION  1987  . UPPER GASTROINTESTINAL ENDOSCOPY  07/29/2018   [facility] - need to repeat per MD  . Arnetha Courser TOOTH EXTRACTION      Home Medications:  Medications Prior to Admission  Medication Sig Dispense Refill Last Dose  . amLODipine (NORVASC) 10 MG tablet Take 10 mg by mouth daily.      Marland Kitchen aspirin EC 81 MG tablet Take 1 tablet (81 mg total) by mouth 2 (two) times daily. (Patient not taking: Reported on 05/04/2019) 60 tablet 0   . atorvastatin (LIPITOR) 20 MG tablet Take 10 mg by  mouth at bedtime.   3   . budesonide-formoterol (SYMBICORT) 160-4.5 MCG/ACT inhaler Inhale 2 puffs into the lungs 2 (two) times daily as needed (Shortness of breath).      . CELEBREX 200 MG capsule Take 200 mg by mouth 2 (two) times daily.     . chlorpheniramine (CHLOR-TRIMETON) 4 MG tablet Take 1 tablet (4 mg total) by mouth at bedtime. 14 tablet 0   . clonazePAM (KLONOPIN) 0.5 MG tablet Take 0.5 mg by mouth 2 (two) times daily as needed for anxiety.      . DULoxetine (CYMBALTA) 30 MG capsule Take 30 mg by mouth daily.     Marland Kitchen gabapentin (NEURONTIN) 300 MG capsule Take 300 mg by mouth 3 (three) times daily.     . hydrochlorothiazide (HYDRODIURIL) 25 MG tablet Take 25 mg by mouth daily as needed (swelling).      Marland Kitchen  metoprolol succinate (TOPROL-XL) 50 MG 24 hr tablet Take 50 mg by mouth at bedtime.   3   . omeprazole (PRILOSEC) 40 MG capsule TAKE 1 CAPSULE(40 MG) BY MOUTH TWICE DAILY (Patient taking differently: Take 40 mg by mouth 2 (two) times daily. ) 120 capsule 0   . oxyCODONE-acetaminophen (PERCOCET/ROXICET) 5-325 MG tablet Take 1 tablet by mouth every 4 (four) hours as needed for severe pain. 30 tablet 0   . OXYGEN Inhale 4 L into the lungs at bedtime.     Marland Kitchen tiZANidine (ZANAFLEX) 2 MG tablet Take 1 tablet (2 mg total) by mouth every 6 (six) hours as needed. (Patient taking differently: Take 2 mg by mouth every 8 (eight) hours as needed for muscle spasms. ) 60 tablet 0   . topiramate (TOPAMAX) 100 MG tablet Take 1 tablet (100 mg total) by mouth 2 (two) times daily. 60 tablet 11   . traZODone (DESYREL) 50 MG tablet Take 50 mg by mouth at bedtime as needed for sleep.       Allergies:  Allergies  Allergen Reactions  . Lisinopril Cough  . Lidocaine     Increase blood pressure  . Doxycycline Diarrhea    Severe headaches  . Metronidazole Other (See Comments)    Severe headaches BUT CAN BE TOLERATED    Family History  Problem Relation Age of Onset  . Colon cancer Father 9       died at 39   . Heart disease Mother        died at 103  . Heart attack Mother   . Heart attack Sister        died at 31  . Hypertension Brother   . Heart attack Cousin 34       with death  . Heart attack Cousin 57       died with MI  . Esophageal cancer Neg Hx   . Rectal cancer Neg Hx   . Stomach cancer Neg Hx   . Colon polyps Neg Hx    Social History:  reports that she has never smoked. She has never used smokeless tobacco. She reports that she does not drink alcohol or use drugs.  ROS: A complete review of systems was performed.  All systems are negative except for pertinent findings as noted. Review of Systems  Constitutional: Positive for malaise/fatigue.  All other systems reviewed and are negative.    Physical Exam:  Vital signs in last 24 hours: Temp:  [98.7 F (37.1 C)-100.7 F (38.2 C)] 100.7 F (38.2 C) (11/01 2035) Pulse Rate:  [87-95] 87 (11/01 2035) Resp:  [16-18] 18 (11/01 2035) BP: (127-151)/(80-94) 127/81 (11/01 2035) SpO2:  [96 %-100 %] 100 % (11/01 2035) General:  Alert and oriented, No acute distress Neck: No JVD or lymphadenopathy Cardiovascular: Regular rate and rhythm Lungs: Regular rate and effort Abdomen: Soft, nontender, nondistended, no abdominal masses Back: No CVA tenderness Extremities: No edema Neurologic: Grossly intact  Laboratory Data:  Results for orders placed or performed during the hospital encounter of 05/08/19 (from the past 24 hour(s))  Urinalysis, Routine w reflex microscopic- may I&O cath if menses     Status: Abnormal   Collection Time: 05/08/19  1:35 PM  Result Value Ref Range   Color, Urine AMBER (A) YELLOW   APPearance CLOUDY (A) CLEAR   Specific Gravity, Urine 1.018 1.005 - 1.030   pH 6.0 5.0 - 8.0   Glucose, UA NEGATIVE NEGATIVE mg/dL   Hgb urine dipstick SMALL (  A) NEGATIVE   Bilirubin Urine NEGATIVE NEGATIVE   Ketones, ur NEGATIVE NEGATIVE mg/dL   Protein, ur 100 (A) NEGATIVE mg/dL   Nitrite POSITIVE (A) NEGATIVE    Leukocytes,Ua MODERATE (A) NEGATIVE   RBC / HPF 21-50 0 - 5 RBC/hpf   WBC, UA >50 (H) 0 - 5 WBC/hpf   Bacteria, UA MANY (A) NONE SEEN   Squamous Epithelial / LPF 6-10 0 - 5   WBC Clumps PRESENT    Mucus PRESENT    Hyaline Casts, UA PRESENT   Basic metabolic panel     Status: Abnormal   Collection Time: 05/08/19  2:43 PM  Result Value Ref Range   Sodium 137 135 - 145 mmol/L   Potassium 3.7 3.5 - 5.1 mmol/L   Chloride 108 98 - 111 mmol/L   CO2 19 (L) 22 - 32 mmol/L   Glucose, Bld 130 (H) 70 - 99 mg/dL   BUN 19 6 - 20 mg/dL   Creatinine, Ser 1.11 (H) 0.44 - 1.00 mg/dL   Calcium 8.7 (L) 8.9 - 10.3 mg/dL   GFR calc non Af Amer 54 (L) >60 mL/min   GFR calc Af Amer >60 >60 mL/min   Anion gap 10 5 - 15  CBC     Status: Abnormal   Collection Time: 05/08/19  2:43 PM  Result Value Ref Range   WBC 13.6 (H) 4.0 - 10.5 K/uL   RBC 4.11 3.87 - 5.11 MIL/uL   Hemoglobin 13.2 12.0 - 15.0 g/dL   HCT 41.8 36.0 - 46.0 %   MCV 101.7 (H) 80.0 - 100.0 fL   MCH 32.1 26.0 - 34.0 pg   MCHC 31.6 30.0 - 36.0 g/dL   RDW 13.8 11.5 - 15.5 %   Platelets 268 150 - 400 K/uL   nRBC 0.0 0.0 - 0.2 %  hCG, quantitative, pregnancy     Status: None   Collection Time: 05/08/19  2:43 PM  Result Value Ref Range   hCG, Beta Chain, Quant, S 1 <5 mIU/mL  Lactic acid, plasma     Status: None   Collection Time: 05/08/19  6:15 PM  Result Value Ref Range   Lactic Acid, Venous 1.0 0.5 - 1.9 mmol/L  SARS Coronavirus 2 by RT PCR (hospital order, performed in [place] hospital lab) Nasopharyngeal Nasopharyngeal Swab     Status: None   Collection Time: 05/08/19  7:14 PM   Specimen: Nasopharyngeal Swab  Result Value Ref Range   SARS Coronavirus 2 NEGATIVE NEGATIVE   Recent Results (from the past 240 hour(s))  SARS Coronavirus 2 by RT PCR (hospital order, performed in [place] hospital lab) Nasopharyngeal Nasopharyngeal Swab     Status: None   Collection Time: 05/08/19  7:14 PM   Specimen: Nasopharyngeal Swab   Result Value Ref Range Status   SARS Coronavirus 2 NEGATIVE NEGATIVE Final    Comment: (NOTE) If result is NEGATIVE SARS-CoV-2 target nucleic acids are NOT DETECTED. The SARS-CoV-2 RNA is generally detectable in upper and lower  respiratory specimens during the acute phase of infection. The lowest  concentration of SARS-CoV-2 viral copies this assay can detect is 250  copies / mL. A negative result does not preclude SARS-CoV-2 infection  and should not be used as the sole basis for treatment or other  patient management decisions.  A negative result may occur with  improper specimen collection / handling, submission of specimen other  than nasopharyngeal swab, presence of viral mutation(s) within the  areas  targeted by this assay, and inadequate number of viral copies  (<250 copies / mL). A negative result must be combined with clinical  observations, patient history, and epidemiological information. If result is POSITIVE SARS-CoV-2 target nucleic acids are DETECTED. The SARS-CoV-2 RNA is generally detectable in upper and lower  respiratory specimens dur ing the acute phase of infection.  Positive  results are indicative of active infection with SARS-CoV-2.  Clinical  correlation with patient history and other diagnostic information is  necessary to determine patient infection status.  Positive results do  not rule out bacterial infection or co-infection with other viruses. If result is PRESUMPTIVE POSTIVE SARS-CoV-2 nucleic acids MAY BE PRESENT.   A presumptive positive result was obtained on the submitted specimen  and confirmed on repeat testing.  While 2019 novel coronavirus  (SARS-CoV-2) nucleic acids may be present in the submitted sample  additional confirmatory testing may be necessary for epidemiological  and / or clinical management purposes  to differentiate between  SARS-CoV-2 and other Sarbecovirus currently known to infect humans.  If clinically indicated additional  testing with an alternate test  methodology [phone] is advised. The SARS-CoV-2 RNA is generally  detectable in upper and lower respiratory sp ecimens during the acute  phase of infection. The expected result is Negative. Fact Sheet for Patients:  StrictlyIdeas.no Fact Sheet for Healthcare Providers: BankingDealers.co.za This test is not yet approved or cleared by the [place] FDA and has been authorized for detection and/or diagnosis of SARS-CoV-2 by FDA under an Emergency Use Authorization (EUA).  This EUA will remain in effect (meaning this test can be used) for the duration of the COVID-19 declaration under Section 564(b)(1) of the Act, 21 U.S.C. section 360bbb-3(b)(1), unless the authorization is terminated or revoked sooner. Performed at [facility], [place] [address] [place], [place] [address]    Creatinine: Recent Labs    05/08/19 1443  CREATININE 1.11*    Impression/Assessment/plan: Right ureteral stone, right hydronephrosis, UTI-she likely has pyelonephritis due to the obstruction and we went over the nature, potential benefits, risks and alternatives to cystoscopy with right retrograde pyelogram and right ureteral stent placement, including side effects of the proposed treatment, the likelihood of the patient achieving the goals of the procedure, and any potential problems that might occur during the procedure or recuperation.  We discussed she will need ureteroscopy in 2 to 3 weeks and the rationale for a staged procedure.  We discussed she would likely need to delay her right knee replacement.  All questions answered. Patient elects to proceed.  I should add we also went over stent pain and discomfort as she has had several stents before and has a lot of frequency and urgency.   Festus Aloe 05/08/2019, 8:46 PM

## 2019-05-08 NOTE — Anesthesia Postprocedure Evaluation (Signed)
Anesthesia Post Note  Patient: Ann [name]  Procedure(s) Performed: CYSTOSCOPY WITH RETROGRADE PYELOGRAM/URETERAL STENT PLACEMENT (Right Ureter)     Patient location during evaluation: PACU Anesthesia Type: General Level of consciousness: awake Pain management: pain level controlled Vital Signs Assessment: post-procedure vital signs reviewed and stable Respiratory status: spontaneous breathing, nonlabored ventilation, respiratory function stable and patient connected to nasal cannula oxygen Cardiovascular status: blood pressure returned to baseline and stable Postop Assessment: no apparent nausea or vomiting Anesthetic complications: no    Last Vitals:  Vitals:   05/08/19 2230 05/08/19 2242  BP:    Pulse:    Resp:    Temp: 37.2 C 37.2 C  SpO2:      Last Pain:  Vitals:   05/08/19 2230  TempSrc:   PainSc: 0-No pain                 Tabbitha Janvrin P Kimberley Speece

## 2019-05-09 ENCOUNTER — Encounter (HOSPITAL_COMMUNITY): Payer: Self-pay | Admitting: Urology

## 2019-05-09 DIAGNOSIS — I10 Essential (primary) hypertension: Secondary | ICD-10-CM

## 2019-05-09 DIAGNOSIS — J449 Chronic obstructive pulmonary disease, unspecified: Secondary | ICD-10-CM

## 2019-05-09 DIAGNOSIS — Z9981 Dependence on supplemental oxygen: Secondary | ICD-10-CM

## 2019-05-09 DIAGNOSIS — N134 Hydroureter: Secondary | ICD-10-CM

## 2019-05-09 DIAGNOSIS — E785 Hyperlipidemia, unspecified: Secondary | ICD-10-CM

## 2019-05-09 DIAGNOSIS — J9611 Chronic respiratory failure with hypoxia: Secondary | ICD-10-CM

## 2019-05-09 LAB — BLOOD CULTURE ID PANEL (REFLEXED)

## 2019-05-09 LAB — MAGNESIUM: Magnesium: 1.7 mg/dL (ref 1.7–2.4)

## 2019-05-09 LAB — GLUCOSE, CAPILLARY: Glucose-Capillary: 117 mg/dL — ABNORMAL HIGH (ref 70–99)

## 2019-05-09 LAB — PHOSPHORUS: Phosphorus: 3.2 mg/dL (ref 2.5–4.6)

## 2019-05-09 LAB — LACTIC ACID, PLASMA: Lactic Acid, Venous: 0.7 mmol/L (ref 0.5–1.9)

## 2019-05-09 MED ORDER — IPRATROPIUM-ALBUTEROL 0.5-2.5 (3) MG/3ML IN SOLN: 3.0000 mL | RESPIRATORY_TRACT | Status: DC | PRN

## 2019-05-09 MED ORDER — MORPHINE SULFATE (PF) 2 MG/ML IV SOLN
2.0000 mg | INTRAVENOUS | Status: DC | PRN
  Administered 2019-05-09: 2 mg via INTRAVENOUS
  Filled 2019-05-09: qty 1

## 2019-05-09 MED ORDER — SODIUM CHLORIDE 0.9 % IV SOLN
2.0000 g | INTRAVENOUS | Status: DC
  Administered 2019-05-09 – 2019-05-12 (×4): 2 g via INTRAVENOUS
  Filled 2019-05-09 (×4): qty 2

## 2019-05-09 MED ORDER — DIPHENHYDRAMINE HCL 25 MG PO CAPS
25.0000 mg | ORAL_CAPSULE | Freq: Four times a day (QID) | ORAL | Status: DC | PRN
  Administered 2019-05-09 – 2019-05-11 (×2): 25 mg via ORAL
  Filled 2019-05-09 (×2): qty 1

## 2019-05-09 NOTE — Progress Notes (Signed)
PROGRESS NOTE    Ann [name]  [id number] DOB: 03-25-1956 DOA: 05/08/2019 PCP: Berkley Harvey, NP    Brief Narrative:   Ann [name] is a 63 y.o. 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  She has had similar symptoms in the past with prior ureteral stones.  She notes urine discoloration which she thought was due to Azo tabs.  She is concerned about undergoing general anesthesia, she has had difficulties with awakening after he utilization of anesthetic agents in the past.  ED Course: In the ED patient initially afebrile, tachycardic, normotensive and saturating comfortably on her home 4 L.  Labs notable for leukocytosis with WBC 13.6, normal renal function and electrolytes, CO2 19, UA showed moderate leukocytes and positive nitrites with greater than 50 WBC per hpf and many bacteria.  CT stone study showed 5 mm stone in the right ureter with severe distal hydronephrosis and moderate hydroureter.  She received IV fluids and ceftriaxone 2 g IV while in the ED.  Urology was consulted with plans for operative management of ureteral stones.  Assessment & Plan:   Active Problems:   Hydronephrosis, right  Sepsis secondary to pyelonephritis Obstructing right ureteral stone with right hydronephrosis and hydroureter Patient presenting with right-sided flank/back pain.  The BC count 13.6, lactic acid 1.4.  CT renal notable for a 5 mm stone mid/distal right ureter with severe hydronephrosis and moderate hydroureter.  Urinalysis with moderate leukoesterase, positive nitrite, many bacteria,  greater than 50 WBCs.  Underwent urgent cystoscopy, retrograde pyelogram, and right ureteral stent placement on 05/08/2019 by urology, Dr. Junious Silk. --Blood cultures x2: Pending --Urine culture: Pending --Continue ceftriaxone 1 g IV every 24 hours --Pain control with oxycodone 5 mg q4h prn, Toradol 15mg  IVq6h prn and morphine 2mg  IV q3h prn for severe breakthrough pain --Continue Foley catheter per urology --Closely monitor urinary output  Essential hypertension: BP 114/69, well controlled --Continue amlodipine 10 mg p.o. daily --Continue metoprolol succinate 50 mg p.o. daily  HLD: Continue statin  GERD: Continue PPI  OSA on CPAP --Continue home CPAP  Chronic hypoxic respiratory failure COPD Patient on 4 L nasal cannula at home. --duonebs as needed --Titrate supplemental oxygen to maintain SPO2 greater than 88%  Chronic migraine headaches Follows with [facility], Dr. Krista Blue. --Continue Topamax 75mg  BID --Morphine as needed   DVT prophylaxis: Lovenox Code Status: Full code Family Communication: None Disposition Plan: Continue current level of care, IV antibiotics, await culture results.   Consultants:   Urology - Dr. Junious Silk  Procedures:   Cystoscopy with retrograde pyelogram and right ureteral stent placement 11/1 - Dr Junious Silk  Antimicrobials:  Ceftriaxone 11/1>>   Subjective: Patient seen and examined bedside, resting comfortably.  Complains of a mild headache.  Right-sided flank/back pain improving, but still present.  Continues with Foley catheter in place.  No other complaints or concerns at this time.  Denies headache, no fever/chills/night sweats, no nausea/vomiting/diarrhea, no chest pain, no palpitations, no shortness of breath, no cough/congestion, no paresthesias.  No acute events overnight per nurse staff.  Objective: Vitals:   05/08/19 2306 05/09/19 0403 05/09/19 0746 05/09/19 1308  BP:  114/69  123/69  Pulse:  67  76  Resp:   20  18  Temp:  97.7 F (36.5 C)  98.2 F (36.8 C)  TempSrc:  Oral  Oral  SpO2:  97% 98% 96%  Weight: 93.5 kg     Height: 5\' 2"  (1.575 m)       Intake/Output Summary (Last 24 hours) at 05/09/2019 1322 Last data filed at 05/09/2019 1300 Gross per 24 hour  Intake 1010 ml  Output 550 ml  Net 460 ml   Filed Weights   05/08/19 2306  Weight: 93.5 kg    Examination:  General exam: Appears calm and comfortable  Respiratory system: Clear to auscultation. Respiratory effort normal.  On 4 L nasal cannula which is her baseline Cardiovascular system: S1 & S2 heard, RRR. No JVD, murmurs, rubs, gallops or clicks. No pedal edema. Gastrointestinal system: Abdomen is nondistended, soft and nontender. No organomegaly or masses felt. Normal bowel sounds heard. GU: Foley catheter noted in place, draining dark yellow urine Central nervous system: Alert and oriented. No focal neurological deficits. Extremities: Symmetric 5 x 5 power. Skin: No rashes, lesions or ulcers Psychiatry: Judgement and insight appear normal. Mood & affect appropriate.     Data Reviewed: I have personally reviewed following labs and imaging studies  CBC: Recent Labs  Lab 05/08/19 1443  WBC 13.6*  HGB 13.2  HCT 41.8  MCV 101.7*  PLT [id number]   Basic Metabolic Panel: Recent Labs  Lab 05/08/19 1443 05/08/19 2333  NA 137  --   K 3.7  --   CL 108  --   CO2 19*  --   GLUCOSE 130*  --   BUN 19  --   CREATININE 1.11*  --   CALCIUM 8.7*  --   MG  --  1.7  PHOS  --  3.2   GFR: Estimated Creatinine Clearance: 57.4 mL/min (A) (by C-G formula based on SCr of 1.11 mg/dL (H)). Liver Function Tests: No results for input(s): AST, ALT, ALKPHOS, BILITOT, PROT, ALBUMIN in the last 168 hours. No results for input(s): LIPASE, AMYLASE in the last 168 hours. No results for input(s): AMMONIA in the last 168 hours. Coagulation Profile: No results for input(s): INR, PROTIME in the last 168 hours. Cardiac Enzymes: No results for  input(s): CKTOTAL, CKMB, CKMBINDEX, TROPONINI in the last 168 hours. BNP (last 3 results) No results for input(s): PROBNP in the last 8760 hours. HbA1C: No results for input(s): HGBA1C in the last 72 hours. CBG: Recent Labs  Lab 05/09/19 0802  GLUCAP 117*   Lipid Profile: No results for input(s): CHOL, HDL, LDLCALC, TRIG, CHOLHDL, LDLDIRECT in the last 72 hours. Thyroid Function Tests: No results for input(s): TSH, T4TOTAL, FREET4, T3FREE, THYROIDAB in the last 72 hours. Anemia Panel: No results for input(s): VITAMINB12, FOLATE, FERRITIN, TIBC, IRON, RETICCTPCT in the last 72 hours. Sepsis Labs: Recent Labs  Lab 05/08/19 1815 05/08/19 2333  LATICACIDVEN 1.0 0.7    Recent Results (from the past 240 hour(s))  Urine C&S     Status: Abnormal (Preliminary result)   Collection Time: 05/08/19  1:36 PM   Specimen: Urine, Random  Result Value Ref Range Status   Specimen Description   Final    URINE, RANDOM Performed at [facility] [address] [place], [place] [address]    Special Requests   Final    NONE Performed at [facility], [place] [address] [place], [place] [address]    Culture (A)  Final    >=  100,000 COLONIES/mL ESCHERICHIA COLI SUSCEPTIBILITIES TO FOLLOW Performed at [facility] [address] [place], [place] [address]    Report Status PENDING  Incomplete  Blood culture (routine x 2)     Status: None (Preliminary result)   Collection Time: 05/08/19  5:33 PM   Specimen: BLOOD  Result Value Ref Range Status   Specimen Description   Final    BLOOD LEFT UPPERARM Performed at [facility] [address] [place], [place] [address]    Special Requests   Final    BOTTLES DRAWN AEROBIC AND ANAEROBIC Blood Culture results may not be optimal due to an inadequate volume of blood received in culture bottles Performed at [facility] [address], [place], [place] [address]     Culture   Final    NO GROWTH < 12 HOURS Performed at [facility] [address], [place], [place] [address]    Report Status PENDING  Incomplete  SARS Coronavirus 2 by RT PCR (hospital order, performed in [facility] hospital lab) Nasopharyngeal Nasopharyngeal Swab     Status: None   Collection Time: 05/08/19  7:14 PM   Specimen: Nasopharyngeal Swab  Result Value Ref Range Status   SARS Coronavirus 2 NEGATIVE NEGATIVE Final    Comment: (NOTE) If result is NEGATIVE SARS-CoV-2 target nucleic acids are NOT DETECTED. The SARS-CoV-2 RNA is generally detectable in upper and lower  respiratory specimens during the acute phase of infection. The lowest  concentration of SARS-CoV-2 viral copies this assay can detect is 250  copies / mL. A negative result does not preclude SARS-CoV-2 infection  and should not be used as the sole basis for treatment or other  patient management decisions.  A negative result may occur with  improper specimen collection / handling, submission of specimen other  than nasopharyngeal swab, presence of viral mutation(s) within the  areas targeted by this assay, and inadequate number of viral copies  (<250 copies / mL). A negative result must be combined with clinical  observations, patient history, and epidemiological information. If result is POSITIVE SARS-CoV-2 target nucleic acids are DETECTED. The SARS-CoV-2 RNA is generally detectable in upper and lower  respiratory specimens dur ing the acute phase of infection.  Positive  results are indicative of active infection with SARS-CoV-2.  Clinical  correlation with patient history and other diagnostic information is  necessary to determine patient infection status.  Positive results do  not rule out bacterial infection or co-infection with other viruses. If result is PRESUMPTIVE POSTIVE SARS-CoV-2 nucleic acids MAY BE PRESENT.   A presumptive positive result was obtained on the submitted specimen  and  confirmed on repeat testing.  While 2019 novel coronavirus  (SARS-CoV-2) nucleic acids may be present in the submitted sample  additional confirmatory testing may be necessary for epidemiological  and / or clinical management purposes  to differentiate between  SARS-CoV-2 and other Sarbecovirus currently known to infect humans.  If clinically indicated additional testing with an alternate test  methodology [phone]) is advised. The SARS-CoV-2 RNA is generally  detectable in upper and lower respiratory sp ecimens during the acute  phase of infection. The expected result is Negative. Fact Sheet for Patients:  StrictlyIdeas.no Fact Sheet for Healthcare Providers: BankingDealers.co.za This test is not yet approved or cleared by the [place] FDA and has been authorized for detection and/or diagnosis of SARS-CoV-2 by FDA under an Emergency Use Authorization (EUA).  This EUA will remain in  effect (meaning this test can be used) for the duration of the COVID-19 declaration under Section 564(b)(1) of the Act, 21 U.S.C. section 360bbb-3(b)(1), unless the authorization is terminated or revoked sooner. Performed at [facility], [place] [address] [place], [place] [address]   Blood culture (routine x 2)     Status: None (Preliminary result)   Collection Time: 05/08/19 11:33 PM   Specimen: BLOOD  Result Value Ref Range Status   Specimen Description BLOOD RIGHT ANTECUBITAL  Final   Special Requests   Final    BOTTLES DRAWN AEROBIC ONLY Blood Culture adequate volume   Culture   Final    NO GROWTH < 12 HOURS Performed at [facility], [place] [address], [place], [place] [address]    Report Status PENDING  Incomplete         Radiology Studies: Dg C-arm 1-60 Min-no Report  Result Date: 05/08/2019 Fluoroscopy was utilized by the requesting physician.  No radiographic interpretation.   Ct Renal Stone Study  Result  Date: 05/08/2019 CLINICAL DATA:  Right flank pain. Concern for UTI versus kidney stone. Previous history of a kidney stone. EXAM: CT ABDOMEN AND PELVIS WITHOUT CONTRAST TECHNIQUE: Multidetector CT imaging of the abdomen and pelvis was performed following the standard protocol without IV contrast. COMPARISON:  08/06/2017. FINDINGS: Lower chest: Clear lung bases.  Heart normal in size. Hepatobiliary: Decreased attenuation of the liver consistent with fatty infiltration. No liver mass or focal lesion. Status post cholecystectomy. No bile duct dilation. Pancreas: Unremarkable. No pancreatic ductal dilatation or surrounding inflammatory changes. Spleen: Normal in size without focal abnormality. Adrenals/Urinary Tract: No adrenal masses. There is marked right hydronephrosis with right perinephric stranding. Proximal to mid right ureter is moderately dilated. Findings are due to a stone or 2 contiguous stones, spanning a length of 5 mm, in the mid to distal ureter at the level of the pelvic brim. No additional ureteral stones. No left hydronephrosis. Normal left ureter. No renal masses. Bladder is decompressed but otherwise unremarkable. Stomach/Bowel: Small hiatal hernia.  Stomach otherwise unremarkable. Small bowel is normal caliber.  No wall thickening or inflammation. Bowel anastomosis staple line noted along the rectosigmoid junction, stable. Colon is normal in caliber. A small portion of the transverse colon enters a small midline anterior abdominal wall hernia, new from the prior exam. Colon otherwise unremarkable. Normal appendix visualized. Vascular/Lymphatic: Mild aortic atherosclerosis. No enlarged lymph nodes. Reproductive: Uterus and bilateral adnexa are unremarkable. Other: No ascites. Musculoskeletal: No fracture or acute finding. No osteoblastic or osteolytic lesions. IMPRESSION: 1. 5 mm stone versus 2 directly adjacent stones in the mid to distal right ureter, at the pelvic brim, causes severe right  hydronephrosis and moderate hydroureter. 2. No other acute findings within the abdomen or pelvis. 3. Small midline hernia containing a knuckle of the transverse colon, but without evidence of obstruction, incarceration or strangulation. This is new since the prior CT. 4. No intrarenal stones. 5. Hepatic steatosis.  Mild aortic atherosclerosis. Electronically Signed   By: Lajean Manes M.D.   On: 05/08/2019 16:15        Scheduled Meds:  amLODipine  10 mg Oral Daily   atorvastatin  10 mg Oral QHS   docusate sodium  100 mg Oral BID   DULoxetine  30 mg Oral Daily   enoxaparin (LOVENOX) injection  40 mg Subcutaneous Q24H   gabapentin  300 mg Oral TID   metoprolol succinate  50 mg Oral QHS   pantoprazole  40 mg  Oral Daily   polyethylene glycol  17 g Oral Daily   senna  1 tablet Oral BID   topiramate  100 mg Oral BID   Continuous Infusions:  cefTRIAXone (ROCEPHIN)  IV       LOS: 1 day   Time spent: 36 minutes spent on chart review, discussion with nursing staff, consultants, updating family and interview/physical exam; more than 50% of that time was spent in counseling and/or coordination of care.   Collyn Ribas J [place] DO [facility] 05/09/2019, 1:22 PM

## 2019-05-09 NOTE — Addendum Note (Signed)
Addendum  created 05/09/19 1128 by Marijo Conception, CRNA   [facility] Staff edited

## 2019-05-09 NOTE — H&P (Signed)
History and Physical    Ann [name] [id number] DOB: 14-Feb-1956 DOA: 05/08/2019  PCP: Berkley Harvey, NP Patient coming from: Home  I have personally briefly reviewed patient's old medical records in [facility]  Chief Complaint: Right flank pain  HPI: Ann [name] is a 63 y.o. 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  She has had similar symptoms in the past with prior ureteral stones.  She notes urine discoloration which she thought was due to Azo tabs.  She is concerned about undergoing general anesthesia, she has had difficulties with awakening after he utilization of anesthetic agents in the past.  ED Course: In the ED patient initially afebrile, tachycardic, normotensive and saturating comfortably on her home 4 L.  Labs notable for leukocytosis with WBC 13.6, normal renal function and electrolytes, CO2 19, UA showed moderate leukocytes and positive nitrites with greater than 50 WBC per hpf and many bacteria.  CT stone study showed 5 mm stone in the right ureter with severe distal hydronephrosis and moderate hydroureter.  She received IV fluids and ceftriaxone 2 g IV while in the ED.  Urology was consulted with plans for operative management of ureteral stones.  Review of Systems: As per HPI otherwise 10 point review of systems negative.   Past Medical History:  Diagnosis Date  . Adenoma of colon   . Allergy    seasonal  . Anemia    with past pregnancy -not recent  . Anxiety   . Arthritis   . Borderline diabetes    diet controlled - no meds  . Chronic cystitis   .  Complication of anesthesia    02-20-2014 ([facility]) INTRA-OP RESPIRATORY FAILURE SECONDARY TO POSSIBLE MUCOUS ASPIRATION/  ALSO 06-06-2013 [facility]) POST-OP DESATURATION  EVEN USING CPAP, States some issues if Propofol given to rapidly.  . Diverticulitis   . Diverticulosis of colon   . Dyspnea    only needs O2 at Night  . Family history of adverse reaction to anesthesia    PER PT SISTER DIED AFTER GENERAL ANESTHESIA DUE TO UNDIAGNOSED OSA  . GERD (gastroesophageal reflux disease)   . H/O hiatal hernia   . Headache(784.0)    migraines, decreased since turning 50's  . History of chronic cough    "tight sounding cough"  . History of esophageal dilatation   . History of kidney stones   . History of MRSA infection    3/ 2015  AXILLARY ABSCESS  . History of recurrent UTIs   . History of TIAs NO RESIDUAL   1980;  2005;   2008 PT STATES PER CT  SCARRING RIGHT SIDE OF BRAIN  . Hyperlipidemia   . Hypertension   . Kidney disease    III  . Neuromuscular disorder ([place])    HH  . Neuropathy    pt denies this 07-05-2015  . Nocturia   . OSA on CPAP    PER SLEEP STUDY 09-08-2012  MODERATE OSA. not used in awhile, but thinks needs to start back using due to some weight gain.  . Osteoarthritis of knee    Right  . Sleep apnea    uses CPAP most nights  .  Stroke [facility]) [phone]   TIA'S slight weakness on lt leg  . SUI (stress urinary incontinence, female)   . SVD (spontaneous vaginal delivery)    x 3    Past Surgical History:  Procedure Laterality Date  . ANTERIOR AND POSTERIOR REPAIR N/A 06/06/2013   Procedure: Anterior vaginal vault repair, Sacrospinous ligament fixation with UPHOLD lite, Kelly plication, Sacrospinous mesh fixation, Solyx transurethral sling;  Surgeon: Ailene Rud, MD;  Location: [facility];  Service: Urology;  Laterality: N/A;  . CHOLECYSTECTOMY N/A 02/10/2017   Procedure: LAPAROSCOPIC CHOLECYSTECTOMY;  Surgeon: Coralie Keens, MD;  Location: [place];   Service: General;  Laterality: N/A;  . COLECTOMY WITH COLOSTOMY CREATION/HARTMANN PROCEDURE N/A 06/11/2017   Procedure: OPEN SIGMOID COLECTOMY;  Surgeon: [name], MD;  Location: [facility];  Service: General;  Laterality: N/A;  . COLONOSCOPY  08/2016   polyps - repeat 5 years Armbruster  . CYSTOSCOPY W/ URETERAL STENT PLACEMENT Right 04/21/2014   Procedure: CYSTOSCOPY WITH RETROGRADE PYELOGRAM/URETERAL STENT PLACEMENT;  Surgeon: Ailene Rud, MD;  Location: [facility];  Service: Urology;  Laterality: Right;  . CYSTOSCOPY W/ URETERAL STENT PLACEMENT Right 11/21/2014   Procedure: CYSTOSCOPY WITH RETROGRADE PYELOGRAM, URETEROSCOPY WITH STONE REMOVAL, URETERAL STENT PLACEMENT;  Surgeon: Carolan Clines, MD;  Location: [facility];  Service: Urology;  Laterality: Right;  . CYSTOSCOPY W/ URETERAL STENT PLACEMENT Left 02/01/2016   Procedure: CYSTO, LEFT URETEROSCOPY, LEFT RETROGRADE AND LEFT URETERAL STENT PLACEMENT;  Surgeon: Carolan Clines, MD;  Location: [facility];  Service: Urology;  Laterality: Left;  . CYSTOSCOPY WITH RETROGRADE PYELOGRAM, URETEROSCOPY AND STENT PLACEMENT Right 06/05/2014   Procedure: CYSTOSCOPY WITH RIGHT RETROGRADE PYELOGRAM, RIGHT URETEROSCOPY, STONE EXTRACTION AND RIGHT DOUBLE J STENT PLACEMENT;  Surgeon: Ailene Rud, MD;  Location: [facility];  Service: Urology;  Laterality: Right;  . ESOPHAGEAL DILATION  X2  LAST ONE  JUNE 2014   has had 9 of these  . ESOPHAGEAL MANOMETRY N/A 09/10/2015   Procedure: ESOPHAGEAL MANOMETRY (EM);  Surgeon: Manus Gunning, MD;  [facility];  Service: Gastroenterology;  Laterality: N/A;  . FRACTURE SURGERY  09/2018   broken nose no surgery  . HOLMIUM LASER APPLICATION Right [id number]   Procedure: HOLMIUM LASER OF STONE ;  Surgeon: Ailene Rud, MD;  Location: [facility];  Service: Urology;  Laterality: Right;  . KNEE ARTHROSCOPY Bilateral 2008   x 2  . LAPAROSCOPIC  CHOLECYSTECTOMY  02/10/2017  . MULTIPLE TOOTH EXTRACTIONS     past hx  . RECTOCELE REPAIR N/A 02/20/2014   Procedure: POSTERIOR REPAIR (RECTOCELE) WITH VAGINAL VAULT REPAIR WITH ACELL GRAFT PLACEMENT, PERINEOPLASTY, ENTEROCELE REPAIR;  Surgeon: Ailene Rud, MD;  Location: [facility];  Service: Urology;  Laterality: N/A;  . TOTAL KNEE ARTHROPLASTY Left 03/28/2019   Procedure: LEFT TOTAL KNEE ARTHROPLASTY;  Surgeon: Frederik Pear, MD;  Location: [facility];  Service: Orthopedics;  Laterality: Left;  . TUBAL LIGATION  1987  . UPPER GASTROINTESTINAL ENDOSCOPY  07/29/2018   Armbruster - need to repeat per MD  . WISDOM TOOTH EXTRACTION       reports that she has never smoked. She has never used smokeless tobacco. She reports that she does not drink alcohol or use drugs.  Allergies  Allergen Reactions  . Lisinopril Cough  . Lidocaine     Increase blood pressure  . Doxycycline Diarrhea    Severe headaches  . Metronidazole Other (See Comments)    Severe headaches BUT CAN BE TOLERATED  Family History  Problem Relation Age of Onset  . Colon cancer Father 72       died at 78  . Heart disease Mother        died at 66  . Heart attack Mother   . Heart attack Sister        died at 76  . Hypertension Brother   . Heart attack Cousin 29       with death  . Heart attack Cousin 13       died with MI  . Esophageal cancer Neg Hx   . Rectal cancer Neg Hx   . Stomach cancer Neg Hx   . Colon polyps Neg Hx     Prior to Admission medications   Medication Sig Start Date End Date Taking? Authorizing Provider  amLODipine (NORVASC) 10 MG tablet Take 10 mg by mouth daily.     [provider]  aspirin EC 81 MG tablet Take 1 tablet (81 mg total) by mouth 2 (two) times daily. Patient not taking: Reported on 05/04/2019 03/28/19   Leighton Parody, PA-C  atorvastatin (LIPITOR) 20 MG tablet Take 10 mg by mouth at bedtime.  05/15/17   [provider]  budesonide-formoterol (SYMBICORT)  160-4.5 MCG/ACT inhaler Inhale 2 puffs into the lungs 2 (two) times daily as needed (Shortness of breath).  07/13/18 07/13/19  [provider]  CELEBREX 200 MG capsule Take 200 mg by mouth 2 (two) times daily. 03/30/19   [provider]  chlorpheniramine (CHLOR-TRIMETON) 4 MG tablet Take 1 tablet (4 mg total) by mouth at bedtime. 03/08/15   Chesley Mires, MD  clonazePAM (KLONOPIN) 0.5 MG tablet Take 0.5 mg by mouth 2 (two) times daily as needed for anxiety.     [provider]  DULoxetine (CYMBALTA) 30 MG capsule Take 30 mg by mouth daily. 03/21/19   [provider]  gabapentin (NEURONTIN) 300 MG capsule Take 300 mg by mouth 3 (three) times daily. 03/30/19   [provider]  hydrochlorothiazide (HYDRODIURIL) 25 MG tablet Take 25 mg by mouth daily as needed (swelling).     [provider]  metoprolol succinate (TOPROL-XL) 50 MG 24 hr tablet Take 50 mg by mouth at bedtime.  08/08/15   [provider]  omeprazole (PRILOSEC) 40 MG capsule TAKE 1 CAPSULE(40 MG) BY MOUTH TWICE DAILY Patient taking differently: Take 40 mg by mouth 2 (two) times daily.  09/17/16   Armbruster, [name], MD  oxyCODONE-acetaminophen (PERCOCET/ROXICET) 5-325 MG tablet Take 1 tablet by mouth every 4 (four) hours as needed for severe pain. 03/28/19   Leighton Parody, PA-C  OXYGEN Inhale 4 L into the lungs at bedtime.    [provider]  tiZANidine (ZANAFLEX) 2 MG tablet Take 1 tablet (2 mg total) by mouth every 6 (six) hours as needed. Patient taking differently: Take 2 mg by mouth every 8 (eight) hours as needed for muscle spasms.  03/28/19   Leighton Parody, PA-C  topiramate (TOPAMAX) 100 MG tablet Take 1 tablet (100 mg total) by mouth 2 (two) times daily. 01/06/19   Marcial Pacas, MD  traZODone (DESYREL) 50 MG tablet Take 50 mg by mouth at bedtime as needed for sleep.  05/20/18   [provider]    Physical Exam: Vitals:   05/08/19 2242 05/08/19 2245 05/08/19  2303 05/08/19 2306  BP:   117/67   Pulse:   89   Resp:   18   Temp: 98.9 F (37.2  C) 98.9 F (37.2 C) 99.7 F (37.6 C)   TempSrc:   Oral   SpO2:   95%   Weight:    93.5 kg  Height:    5\' 2"  (1.575 m)    Constitutional: NAD, calm, comfortable Eyes: PERRL, lids and conjunctivae normal ENMT: Mucous membranes are moist. Posterior pharynx clear of any exudate or lesions. Neck: normal, supple, no masses Respiratory: clear to auscultation bilaterally, no wheezing, no crackles. Cardiovascular: Regular rate and rhythm, no murmurs / rubs / gallops. No extremity edema. 2+ pedal pulses. Abdomen: (+) suprapubic and right flank tenderness, no masses palpated. Bowel sounds positive.  Musculoskeletal: no clubbing / cyanosis. No joint deformity upper and lower extremities. Normal muscle tone.  Skin: no rashes, lesions, ulcers. No induration Neurologic: CN 2-12 grossly intact. Sensation intact, DTR normal. Strength 5/5 in all 4.  Psychiatric: Normal judgment and insight. Alert and oriented x 3. Normal mood.    Labs on Admission: I have personally reviewed following labs and imaging studies  CBC: Recent Labs  Lab 05/08/19 1443  WBC 13.6*  HGB 13.2  HCT 41.8  MCV 101.7*  PLT [id number]   Basic Metabolic Panel: Recent Labs  Lab 05/08/19 1443  NA 137  K 3.7  CL 108  CO2 19*  GLUCOSE 130*  BUN 19  CREATININE 1.11*  CALCIUM 8.7*   GFR: Estimated Creatinine Clearance: 57.4 mL/min (A) (by C-G formula based on SCr of 1.11 mg/dL (H)). Liver Function Tests: No results for input(s): AST, ALT, ALKPHOS, BILITOT, PROT, ALBUMIN in the last 168 hours. No results for input(s): LIPASE, AMYLASE in the last 168 hours. No results for input(s): AMMONIA in the last 168 hours. Coagulation Profile: No results for input(s): INR, PROTIME in the last 168 hours. Cardiac Enzymes: No results for input(s): CKTOTAL, CKMB, CKMBINDEX, TROPONINI in the last 168 hours. BNP (last 3 results) No results for input(s):  PROBNP in the last 8760 hours. HbA1C: No results for input(s): HGBA1C in the last 72 hours. CBG: No results for input(s): GLUCAP in the last 168 hours. Lipid Profile: No results for input(s): CHOL, HDL, LDLCALC, TRIG, CHOLHDL, LDLDIRECT in the last 72 hours. Thyroid Function Tests: No results for input(s): TSH, T4TOTAL, FREET4, T3FREE, THYROIDAB in the last 72 hours. Anemia Panel: No results for input(s): VITAMINB12, FOLATE, FERRITIN, TIBC, IRON, RETICCTPCT in the last 72 hours. Urine analysis:    Component Value Date/Time   COLORURINE AMBER (A) 05/08/2019 1335   APPEARANCEUR CLOUDY (A) 05/08/2019 1335   LABSPEC 1.018 05/08/2019 1335   PHURINE 6.0 05/08/2019 1335   GLUCOSEU NEGATIVE 05/08/2019 1335   HGBUR SMALL (A) 05/08/2019 1335   BILIRUBINUR NEGATIVE 05/08/2019 1335   KETONESUR NEGATIVE 05/08/2019 1335   PROTEINUR 100 (A) 05/08/2019 1335   UROBILINOGEN 0.2 02/09/2015 1322   NITRITE POSITIVE (A) 05/08/2019 1335   LEUKOCYTESUR MODERATE (A) 05/08/2019 1335    Radiological Exams on Admission: Dg C-arm 1-60 Min-no Report  Result Date: 05/08/2019 Fluoroscopy was utilized by the requesting physician.  No radiographic interpretation.   Ct Renal Stone Study  Result Date: 05/08/2019 CLINICAL DATA:  Right flank pain. Concern for UTI versus kidney stone. Previous history of a kidney stone. EXAM: CT ABDOMEN AND PELVIS WITHOUT CONTRAST TECHNIQUE: Multidetector CT imaging of the abdomen and pelvis was performed following the standard protocol without IV contrast. COMPARISON:  08/06/2017. FINDINGS: Lower chest: Clear lung bases.  Heart normal in size. Hepatobiliary: Decreased attenuation of the liver consistent with fatty infiltration. No liver mass or  focal lesion. Status post cholecystectomy. No bile duct dilation. Pancreas: Unremarkable. No pancreatic ductal dilatation or surrounding inflammatory changes. Spleen: Normal in size without focal abnormality. Adrenals/Urinary Tract: No adrenal  masses. There is marked right hydronephrosis with right perinephric stranding. Proximal to mid right ureter is moderately dilated. Findings are due to a stone or 2 contiguous stones, spanning a length of 5 mm, in the mid to distal ureter at the level of the pelvic brim. No additional ureteral stones. No left hydronephrosis. Normal left ureter. No renal masses. Bladder is decompressed but otherwise unremarkable. Stomach/Bowel: Small hiatal hernia.  Stomach otherwise unremarkable. Small bowel is normal caliber.  No wall thickening or inflammation. Bowel anastomosis staple line noted along the rectosigmoid junction, stable. Colon is normal in caliber. A small portion of the transverse colon enters a small midline anterior abdominal wall hernia, new from the prior exam. Colon otherwise unremarkable. Normal appendix visualized. Vascular/Lymphatic: Mild aortic atherosclerosis. No enlarged lymph nodes. Reproductive: Uterus and bilateral adnexa are unremarkable. Other: No ascites. Musculoskeletal: No fracture or acute finding. No osteoblastic or osteolytic lesions. IMPRESSION: 1. 5 mm stone versus 2 directly adjacent stones in the mid to distal right ureter, at the pelvic brim, causes severe right hydronephrosis and moderate hydroureter. 2. No other acute findings within the abdomen or pelvis. 3. Small midline hernia containing a knuckle of the transverse colon, but without evidence of obstruction, incarceration or strangulation. This is new since the prior CT. 4. No intrarenal stones. 5. Hepatic steatosis.  Mild aortic atherosclerosis. Electronically Signed   By: Lajean Manes M.D.   On: 05/08/2019 16:15    Assessment/Plan Active Problems:   Hydronephrosis, right   Ms. Muzzy presents with sepsis secondary to pyelonephritis and possible infected right ureteral stone.  She is planned for operative intervention with urology tonight.  Upon chart review it does not appear she has any history of resistant organisms in her  urine.  Sepsis secondary to pyelonephritis Obstructing right ureteral stone with right hydronephrosis and hydroureter -Operative intervention per urology -Continue ceftriaxone 1 g daily -Follow-up culture data -Pain control as necessary, caution in the immediate aftermath of operative intervention as patient is quite sensitive to anesthetic agents -Keep n.p.o., continue IV fluids for now  OSA on CPAP -Start AutoPap nightly postoperatively (patient does not know home CPAP settings) -Would encourage patient to bring in home CPAP to resume home settings  Chronic hypoxic respiratory failure -Continue 4 L submental oxygen per home -Will transition Symbicort to DuoNebs postoperatively -RT eval and treat, incentive spirometry 10 times per hour while awake -SpO2 goal >=88%  DVT prophylaxis: Lovenox Code Status: Full Disposition Plan: Home in 1-2 days Consults called: Urology (Dr. Junious Silk) Admission status: Inpatient    Sharene Butters MD [facility]  If 7PM-7AM, please contact night-coverage [email or web address] Password [email or web address]  05/09/2019, 12:00 AM

## 2019-05-09 NOTE — Progress Notes (Signed)
PHARMACY - PHYSICIAN COMMUNICATION CRITICAL VALUE ALERT - BLOOD CULTURE IDENTIFICATION (BCID)  [name] is an 63 y.o. female who presented to [facility] on 05/08/2019   Assessment:  Patient currently being treated for sepsis 2/2 pyelonephritis with obstructing right ureteral stone. BCID now + 2/3 bottles E.coli, no KPC detected.   Name of physician (or Provider) Contacted: Dr. [place]  Current antibiotics: Ceftriaxone 1 g IV daily, however pt received a dose of ceftriaxone 2 g IV last night (11/1)  Changes to prescribed antibiotics: Increased ceftriaxone to 2 g IV q24h   Results for orders placed or performed during the hospital encounter of 05/08/19  Blood Culture ID Panel (Reflexed) (Collected: 05/08/2019  5:33 PM)  Result Value Ref Range   Enterococcus species NOT DETECTED NOT DETECTED   Listeria monocytogenes NOT DETECTED NOT DETECTED   Staphylococcus species NOT DETECTED NOT DETECTED   Staphylococcus aureus (BCID) NOT DETECTED NOT DETECTED   Streptococcus species NOT DETECTED NOT DETECTED   Streptococcus agalactiae NOT DETECTED NOT DETECTED   Streptococcus pneumoniae NOT DETECTED NOT DETECTED   Streptococcus pyogenes NOT DETECTED NOT DETECTED   Acinetobacter baumannii NOT DETECTED NOT DETECTED   Enterobacteriaceae species DETECTED (A) NOT DETECTED   Enterobacter cloacae complex NOT DETECTED NOT DETECTED   Escherichia coli DETECTED (A) NOT DETECTED   Klebsiella oxytoca NOT DETECTED NOT DETECTED   Klebsiella pneumoniae NOT DETECTED NOT DETECTED   Proteus species NOT DETECTED NOT DETECTED   Serratia marcescens NOT DETECTED NOT DETECTED   Carbapenem resistance NOT DETECTED NOT DETECTED   Haemophilus influenzae NOT DETECTED NOT DETECTED   Neisseria meningitidis NOT DETECTED NOT DETECTED   Pseudomonas aeruginosa NOT DETECTED NOT DETECTED   Candida albicans NOT DETECTED NOT DETECTED   Candida glabrata NOT DETECTED NOT DETECTED   Candida krusei NOT DETECTED NOT DETECTED   Candida parapsilosis NOT DETECTED NOT DETECTED   Candida tropicalis NOT DETECTED NOT Leslie, PharmD 05/09/2019  3:48 PM

## 2019-05-10 ENCOUNTER — Ambulatory Visit: Payer: Medicaid Other | Admitting: Physical Therapy

## 2019-05-10 DIAGNOSIS — A4151 Sepsis due to Escherichia coli [E. coli]: Principal | ICD-10-CM

## 2019-05-10 DIAGNOSIS — N10 Acute tubulo-interstitial nephritis: Secondary | ICD-10-CM

## 2019-05-10 LAB — GLUCOSE, CAPILLARY: Glucose-Capillary: 80 mg/dL (ref 70–99)

## 2019-05-10 LAB — BASIC METABOLIC PANEL
Anion gap: 8 (ref 5–15)
BUN: 30 mg/dL — ABNORMAL HIGH (ref 6–20)
CO2: 23 mmol/L (ref 22–32)
Calcium: 8.5 mg/dL — ABNORMAL LOW (ref 8.9–10.3)
Chloride: 108 mmol/L (ref 98–111)
Creatinine, Ser: 1.27 mg/dL — ABNORMAL HIGH (ref 0.44–1.00)
GFR calc Af Amer: 53 mL/min — ABNORMAL LOW (ref >60)
GFR calc non Af Amer: 46 mL/min — ABNORMAL LOW (ref >60)
Glucose, Bld: 102 mg/dL — ABNORMAL HIGH (ref 70–99)
Potassium: 3.7 mmol/L (ref 3.5–5.1)
Sodium: 139 mmol/L (ref 135–145)

## 2019-05-10 LAB — CBC
HCT: 36.7 % (ref 36.0–46.0)
Hemoglobin: 11 g/dL — ABNORMAL LOW (ref 12.0–15.0)
MCH: 31.7 pg (ref 26.0–34.0)
MCHC: 30 g/dL (ref 30.0–36.0)
MCV: 105.8 fL — ABNORMAL HIGH (ref 80.0–100.0)
Platelets: 256 10*3/uL (ref 150–400)
RBC: 3.47 MIL/uL — ABNORMAL LOW (ref 3.87–5.11)
RDW: 14.3 % (ref 11.5–15.5)
WBC: 8.5 10*3/uL (ref 4.0–10.5)
nRBC: 0 % (ref 0.0–0.2)

## 2019-05-10 LAB — URINE CULTURE: Culture: >=100000 — AB

## 2019-05-10 MED ORDER — SUMATRIPTAN SUCCINATE 50 MG PO TABS
50.0000 mg | ORAL_TABLET | ORAL | Status: DC | PRN
  Administered 2019-05-10 – 2019-05-13 (×5): 50 mg via ORAL
  Filled 2019-05-10 (×6): qty 1

## 2019-05-10 MED ORDER — SODIUM CHLORIDE 0.9% FLUSH
10.0000 mL | INTRAVENOUS | Status: DC | PRN
  Administered 2019-05-11: 10 mL
  Filled 2019-05-10: qty 40

## 2019-05-10 MED ORDER — MOMETASONE FURO-FORMOTEROL FUM 200-5 MCG/ACT IN AERO
2.0000 | INHALATION_SPRAY | Freq: Two times a day (BID) | RESPIRATORY_TRACT | Status: DC
  Administered 2019-05-10 – 2019-05-13 (×5): 2 via RESPIRATORY_TRACT
  Filled 2019-05-10: qty 8.8

## 2019-05-10 MED ORDER — SODIUM CHLORIDE 0.9% FLUSH
10.0000 mL | Freq: Two times a day (BID) | INTRAVENOUS | Status: DC
  Administered 2019-05-10 – 2019-05-13 (×3): 10 mL

## 2019-05-10 MED ORDER — PROMETHAZINE HCL 25 MG/ML IJ SOLN
12.5000 mg | Freq: Four times a day (QID) | INTRAMUSCULAR | Status: DC | PRN
  Administered 2019-05-10 – 2019-05-12 (×6): 12.5 mg via INTRAVENOUS
  Filled 2019-05-10 (×7): qty 1

## 2019-05-10 MED ORDER — TAMSULOSIN HCL 0.4 MG PO CAPS
0.4000 mg | ORAL_CAPSULE | Freq: Every day | ORAL | Status: DC
  Administered 2019-05-10 – 2019-05-11 (×2): 0.4 mg via ORAL
  Filled 2019-05-10 (×3): qty 1

## 2019-05-10 NOTE — Progress Notes (Signed)
PROGRESS NOTE    HOPIE MOOTHART  [id number] DOB: Oct 17, 1955 DOA: 05/08/2019 PCP: Berkley Harvey, NP    Brief Narrative:   Ann [name] is a 63 y.o. 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  She has had similar symptoms in the past with prior ureteral stones.  She notes urine discoloration which she thought was due to Azo tabs.  She is concerned about undergoing general anesthesia, she has had difficulties with awakening after he utilization of anesthetic agents in the past.  ED Course: In the ED patient initially afebrile, tachycardic, normotensive and saturating comfortably on her home 4 L.  Labs notable for leukocytosis with WBC 13.6, normal renal function and electrolytes, CO2 19, UA showed moderate leukocytes and positive nitrites with greater than 50 WBC per hpf and many bacteria.  CT stone study showed 5 mm stone in the right ureter with severe distal hydronephrosis and moderate hydroureter.  She received IV fluids and ceftriaxone 2 g IV while in the ED.  Urology was consulted with plans for operative management of ureteral stones.  Assessment & Plan:   Active Problems:   Hydronephrosis, right  Sepsis secondary to pyelonephritis E. coli septicemia Obstructing right ureteral stone with right hydronephrosis and hydroureter Patient presenting with right-sided flank/back pain.  The BC count 13.6, lactic acid 1.4.  CT renal notable for a 5 mm stone mid/distal right ureter with severe hydronephrosis and moderate hydroureter.  Urinalysis with moderate leukoesterase, positive nitrite,  many bacteria, greater than 50 WBCs.  Underwent urgent cystoscopy, retrograde pyelogram, and right ureteral stent placement on 05/08/2019 by urology, Dr. Junious Silk. --Blood cultures x2: E. coli, pending susceptibilities --Urine culture: E. coli with resistance to ampicillin, ampicillin/sulbactam --Continue ceftriaxone 2 g IV every 24 hours --Pain control with oxycodone 5 mg q4h prn, Toradol 15mg  IVq6h prn and morphine 2mg  IV q3h prn for severe breakthrough pain --Tamsulosin --Foley catheter removed per urology --Closely monitor urinary output --Urology plans repeat right ureteroscopy in a few weeks to clear out stone in the right ureter  Essential hypertension: BP 114/69, well controlled --Continue amlodipine 10 mg p.o. daily --Continue metoprolol succinate 50 mg p.o. daily  HLD: Continue statin  GERD: Continue PPI  OSA on CPAP --Continue home CPAP  Chronic hypoxic respiratory failure COPD Patient on 4 L nasal cannula at home. --duonebs as needed --Titrate supplemental oxygen to maintain SPO2 greater than 88%  Chronic migraine headaches Follows with [facility], Dr. Krista Blue. --Continue Topamax 75mg  BID --Imitrex prn   DVT prophylaxis: Lovenox Code Status: Full code Family Communication: None Disposition Plan: Continue current level of care, IV antibiotics, likely discharge home on 05/11/2019   Consultants:   Urology - Dr. Junious Silk  Procedures:   Cystoscopy with retrograde pyelogram and right ureteral stent placement 11/1 - Dr Junious Silk  Antimicrobials:  Ceftriaxone 11/1>>   Subjective: Patient seen and examined bedside, resting comfortably.  Complains of a mild headache.  Right-sided flank/back pain improving, but still present.  Foley catheter removed.  Complains of subjective fevers, but no further fevers documented in EMR.  No other complaints or concerns at this time.  Denies headache, no fever/chills/night sweats, no nausea/vomiting/diarrhea,  no chest pain, no palpitations, no shortness of breath, no cough/congestion, no paresthesias.  No acute events overnight per nurse staff.  Objective: Vitals:   05/09/19 2107 05/09/19 2110 05/10/19 0519 05/10/19 1306  BP:  123/69 108/70 125/75  Pulse: 74 73 67 71  Resp:  18 18 16   Temp:  98 F (36.7 C) 97.7 F (36.5 C) 99 F (37.2 C)  TempSrc:  Oral Oral Oral  SpO2: 98% 93% 97% 100%  Weight:   94.8 kg   Height:        Intake/Output Summary (Last 24 hours) at 05/10/2019 1543 Last data filed at 05/10/2019 1330 Gross per 24 hour  Intake 1180 ml  Output 1200 ml  Net -20 ml   Filed Weights   05/08/19 2306 05/10/19 0519  Weight: 93.5 kg 94.8 kg    Examination:  General exam: Appears calm and comfortable  Respiratory system: Clear to auscultation. Respiratory effort normal.  On 4 L nasal cannula which is her baseline Cardiovascular system: S1 & S2 heard, RRR. No JVD, murmurs, rubs, gallops or clicks. No pedal edema. Gastrointestinal system: Abdomen is nondistended, soft and nontender. No organomegaly or masses felt. Normal bowel sounds heard. Central nervous system: Alert and oriented. No focal neurological deficits. Extremities: Symmetric 5 x 5 power. Skin: No rashes, lesions or ulcers Psychiatry: Judgement and insight appear normal. Mood & affect appropriate.     Data Reviewed: I have personally reviewed following labs and imaging studies  CBC: Recent Labs  Lab 05/08/19 1443 05/10/19 0711  WBC 13.6* 8.5  HGB 13.2 11.0*  HCT 41.8 36.7  MCV 101.7* 105.8*  PLT [id number]   Basic Metabolic Panel: Recent Labs  Lab 05/08/19 1443 05/08/19 2333 05/10/19 0711  NA 137  --  139  K 3.7  --  3.7  CL 108  --  108  CO2 19*  --  23  GLUCOSE 130*  --  102*  BUN 19  --  30*  CREATININE 1.11*  --  1.27*  CALCIUM 8.7*  --  8.5*  MG  --  1.7  --   PHOS  --  3.2  --    GFR: Estimated Creatinine Clearance: 50.6 mL/min (A) (by C-G formula based on SCr of 1.27 mg/dL (H)). Liver  Function Tests: No results for input(s): AST, ALT, ALKPHOS, BILITOT, PROT, ALBUMIN in the last 168 hours. No results for input(s): LIPASE, AMYLASE in the last 168 hours. No results for input(s): AMMONIA in the last 168 hours. Coagulation Profile: No results for input(s): INR, PROTIME in the last 168 hours. Cardiac Enzymes: No results for input(s): CKTOTAL, CKMB, CKMBINDEX, TROPONINI in the last 168 hours. BNP (last 3 results) No results for input(s): PROBNP in the last 8760 hours. HbA1C: No results for input(s): HGBA1C in the last 72 hours. CBG: Recent Labs  Lab 05/09/19 0802 05/10/19 0806  GLUCAP 117* 80   Lipid Profile: No results for input(s): CHOL, HDL, LDLCALC, TRIG, CHOLHDL, LDLDIRECT in the last 72 hours. Thyroid Function Tests: No results for input(s): TSH, T4TOTAL, FREET4, T3FREE, THYROIDAB in the last 72 hours. Anemia Panel: No results for input(s): VITAMINB12, FOLATE, FERRITIN, TIBC, IRON, RETICCTPCT in the last 72 hours. Sepsis Labs: Recent Labs  Lab 05/08/19 1815 05/08/19 2333  LATICACIDVEN 1.0 0.7    Recent Results (from the past 240 hour(s))  Urine C&S     Status: Abnormal   Collection Time: 05/08/19  1:36 PM   Specimen: Urine, Random  Result Value Ref Range  Status   Specimen Description   Final    URINE, RANDOM Performed at [facility] [address] [place], [place] [address]    Special Requests   Final    NONE Performed at [facility], [place] [address] [place], [place] [address]    Culture >=100,000 COLONIES/mL ESCHERICHIA COLI (A)  Final   Report Status 05/10/2019 FINAL  Final   Organism ID, Bacteria ESCHERICHIA COLI (A)  Final      Susceptibility   Escherichia coli - MIC*    AMPICILLIN >=32 RESISTANT Resistant     CEFAZOLIN <=4 SENSITIVE Sensitive     CEFTRIAXONE <=1 SENSITIVE Sensitive     CIPROFLOXACIN <=0.25 SENSITIVE Sensitive     GENTAMICIN <=1 SENSITIVE Sensitive     IMIPENEM <=0.25 SENSITIVE  Sensitive     NITROFURANTOIN <=16 SENSITIVE Sensitive     TRIMETH/SULFA <=20 SENSITIVE Sensitive     AMPICILLIN/SULBACTAM >=32 RESISTANT Resistant     PIP/TAZO <=4 SENSITIVE Sensitive     Extended ESBL NEGATIVE Sensitive     * >=100,000 COLONIES/mL ESCHERICHIA COLI  Blood culture (routine x 2)     Status: Abnormal (Preliminary result)   Collection Time: 05/08/19  5:33 PM   Specimen: BLOOD  Result Value Ref Range Status   Specimen Description   Final    BLOOD LEFT UPPERARM Performed at [facility] [address] [place], [place] [address]    Special Requests   Final    BOTTLES DRAWN AEROBIC AND ANAEROBIC Blood Culture results may not be optimal due to an inadequate volume of blood received in culture bottles Performed at [facility] [address] [place], [place] [address]    Culture  Setup Time   Final    GRAM NEGATIVE RODS IN BOTH AEROBIC AND ANAEROBIC BOTTLES CRITICAL RESULT CALLED TO, READ BACK BY AND VERIFIED WITH: Shelda Jakes PharmD 15:45 05/09/19 ([email or web address]) Performed at [facility], [address] [place], [place] [address]    Culture ESCHERICHIA COLI (A)  Final   Report Status PENDING  Incomplete  Blood Culture ID Panel (Reflexed)     Status: Abnormal   Collection Time: 05/08/19  5:33 PM  Result Value Ref Range Status   Enterococcus species NOT DETECTED NOT DETECTED Final   Listeria monocytogenes NOT DETECTED NOT DETECTED Final   Staphylococcus species NOT DETECTED NOT DETECTED Final   Staphylococcus aureus (BCID) NOT DETECTED NOT DETECTED Final   Streptococcus species NOT DETECTED NOT DETECTED Final   Streptococcus agalactiae NOT DETECTED NOT DETECTED Final   Streptococcus pneumoniae NOT DETECTED NOT DETECTED Final   Streptococcus pyogenes NOT DETECTED NOT DETECTED Final   Acinetobacter baumannii NOT DETECTED NOT DETECTED Final   Enterobacteriaceae species DETECTED (A) NOT DETECTED Final    Comment: Enterobacteriaceae  represent a large family of gram-negative bacteria, not a single organism. CRITICAL RESULT CALLED TO, READ BACK BY AND VERIFIED WITH: Shelda Jakes PharmD 15:45 05/09/19 ([email or web address])    Enterobacter cloacae complex NOT DETECTED NOT DETECTED Final   Escherichia coli DETECTED (A) NOT DETECTED Final    Comment: CRITICAL RESULT CALLED TO, READ BACK BY AND VERIFIED WITH: Shelda Jakes PharmD 15:45 05/09/19 ([email or web address])    Klebsiella oxytoca NOT DETECTED NOT DETECTED Final   Klebsiella pneumoniae NOT DETECTED NOT DETECTED Final   Proteus species NOT DETECTED NOT DETECTED Final   Serratia marcescens NOT DETECTED NOT DETECTED Final   Carbapenem resistance NOT DETECTED NOT DETECTED Final   Haemophilus  influenzae NOT DETECTED NOT DETECTED Final   Neisseria meningitidis NOT DETECTED NOT DETECTED Final   Pseudomonas aeruginosa NOT DETECTED NOT DETECTED Final   Candida albicans NOT DETECTED NOT DETECTED Final   Candida glabrata NOT DETECTED NOT DETECTED Final   Candida krusei NOT DETECTED NOT DETECTED Final   Candida parapsilosis NOT DETECTED NOT DETECTED Final   Candida tropicalis NOT DETECTED NOT DETECTED Final    Comment: Performed at [facility], [place] [address] [place], [place] [address]  SARS Coronavirus 2 by RT PCR (hospital order, performed in [facility] hospital lab) Nasopharyngeal Nasopharyngeal Swab     Status: None   Collection Time: 05/08/19  7:14 PM   Specimen: Nasopharyngeal Swab  Result Value Ref Range Status   SARS Coronavirus 2 NEGATIVE NEGATIVE Final    Comment: (NOTE) If result is NEGATIVE SARS-CoV-2 target nucleic acids are NOT DETECTED. The SARS-CoV-2 RNA is generally detectable in upper and lower  respiratory specimens during the acute phase of infection. The lowest  concentration of SARS-CoV-2 viral copies this assay can detect is 250  copies / mL. A negative result does not preclude SARS-CoV-2 infection  and should not be used as the sole basis for treatment or other  patient  management decisions.  A negative result may occur with  improper specimen collection / handling, submission of specimen other  than nasopharyngeal swab, presence of viral mutation(s) within the  areas targeted by this assay, and inadequate number of viral copies  (<250 copies / mL). A negative result must be combined with clinical  observations, patient history, and epidemiological information. If result is POSITIVE SARS-CoV-2 target nucleic acids are DETECTED. The SARS-CoV-2 RNA is generally detectable in upper and lower  respiratory specimens dur ing the acute phase of infection.  Positive  results are indicative of active infection with SARS-CoV-2.  Clinical  correlation with patient history and other diagnostic information is  necessary to determine patient infection status.  Positive results do  not rule out bacterial infection or co-infection with other viruses. If result is PRESUMPTIVE POSTIVE SARS-CoV-2 nucleic acids MAY BE PRESENT.   A presumptive positive result was obtained on the submitted specimen  and confirmed on repeat testing.  While 2019 novel coronavirus  (SARS-CoV-2) nucleic acids may be present in the submitted sample  additional confirmatory testing may be necessary for epidemiological  and / or clinical management purposes  to differentiate between  SARS-CoV-2 and other Sarbecovirus currently known to infect humans.  If clinically indicated additional testing with an alternate test  methodology [phone]) is advised. The SARS-CoV-2 RNA is generally  detectable in upper and lower respiratory sp ecimens during the acute  phase of infection. The expected result is Negative. Fact Sheet for Patients:  StrictlyIdeas.no Fact Sheet for Healthcare Providers: BankingDealers.co.za This test is not yet approved or cleared by the [place] FDA and has been authorized for detection and/or diagnosis of SARS-CoV-2 by FDA under  an Emergency Use Authorization (EUA).  This EUA will remain in effect (meaning this test can be used) for the duration of the COVID-19 declaration under Section 564(b)(1) of the Act, 21 U.S.C. section 360bbb-3(b)(1), unless the authorization is terminated or revoked sooner. Performed at [facility], [place] [address] [place], [place] [address]   Blood culture (routine x 2)     Status: None (Preliminary result)   Collection Time: 05/08/19 11:33 PM   Specimen: BLOOD  Result Value Ref Range Status   Specimen Description BLOOD RIGHT  ANTECUBITAL  Final   Special Requests   Final    BOTTLES DRAWN AEROBIC ONLY Blood Culture adequate volume   Culture   Final    NO GROWTH 1 DAY Performed at [facility], [address], [place], [place] [address]    Report Status PENDING  Incomplete         Radiology Studies: Dg C-arm 1-60 Min-no Report  Result Date: 05/08/2019 Fluoroscopy was utilized by the requesting physician.  No radiographic interpretation.   Ct Renal Stone Study  Result Date: 05/08/2019 CLINICAL DATA:  Right flank pain. Concern for UTI versus kidney stone. Previous history of a kidney stone. EXAM: CT ABDOMEN AND PELVIS WITHOUT CONTRAST TECHNIQUE: Multidetector CT imaging of the abdomen and pelvis was performed following the standard protocol without IV contrast. COMPARISON:  08/06/2017. FINDINGS: Lower chest: Clear lung bases.  Heart normal in size. Hepatobiliary: Decreased attenuation of the liver consistent with fatty infiltration. No liver mass or focal lesion. Status post cholecystectomy. No bile duct dilation. Pancreas: Unremarkable. No pancreatic ductal dilatation or surrounding inflammatory changes. Spleen: Normal in size without focal abnormality. Adrenals/Urinary Tract: No adrenal masses. There is marked right hydronephrosis with right perinephric stranding. Proximal to mid right ureter is moderately dilated. Findings are due to a stone or 2 contiguous  stones, spanning a length of 5 mm, in the mid to distal ureter at the level of the pelvic brim. No additional ureteral stones. No left hydronephrosis. Normal left ureter. No renal masses. Bladder is decompressed but otherwise unremarkable. Stomach/Bowel: Small hiatal hernia.  Stomach otherwise unremarkable. Small bowel is normal caliber.  No wall thickening or inflammation. Bowel anastomosis staple line noted along the rectosigmoid junction, stable. Colon is normal in caliber. A small portion of the transverse colon enters a small midline anterior abdominal wall hernia, new from the prior exam. Colon otherwise unremarkable. Normal appendix visualized. Vascular/Lymphatic: Mild aortic atherosclerosis. No enlarged lymph nodes. Reproductive: Uterus and bilateral adnexa are unremarkable. Other: No ascites. Musculoskeletal: No fracture or acute finding. No osteoblastic or osteolytic lesions. IMPRESSION: 1. 5 mm stone versus 2 directly adjacent stones in the mid to distal right ureter, at the pelvic brim, causes severe right hydronephrosis and moderate hydroureter. 2. No other acute findings within the abdomen or pelvis. 3. Small midline hernia containing a knuckle of the transverse colon, but without evidence of obstruction, incarceration or strangulation. This is new since the prior CT. 4. No intrarenal stones. 5. Hepatic steatosis.  Mild aortic atherosclerosis. Electronically Signed   By: Lajean Manes M.D.   On: 05/08/2019 16:15        Scheduled Meds: . amLODipine  10 mg Oral Daily  . atorvastatin  10 mg Oral QHS  . docusate sodium  100 mg Oral BID  . DULoxetine  30 mg Oral Daily  . enoxaparin (LOVENOX) injection  40 mg Subcutaneous Q24H  . gabapentin  300 mg Oral TID  . metoprolol succinate  50 mg Oral QHS  . mometasone-formoterol  2 puff Inhalation BID  . pantoprazole  40 mg Oral Daily  . polyethylene glycol  17 g Oral Daily  . senna  1 tablet Oral BID  . sodium chloride flush  10-40 mL  Intracatheter Q12H  . tamsulosin  0.4 mg Oral QPC supper  . topiramate  100 mg Oral BID   Continuous Infusions: . cefTRIAXone (ROCEPHIN)  IV Stopped (05/09/19 1758)     LOS: 2 days   Time spent: 34 minutes spent on chart review, discussion  with nursing staff, consultants, updating family and interview/physical exam; more than 50% of that time was spent in counseling and/or coordination of care.    J [place] DO [facility] 05/10/2019, 3:43 PM

## 2019-05-10 NOTE — Progress Notes (Signed)
2 Days Post-Op Subjective: Patient reports She is still feeling.  He.  She has a headache.  Urine culture growing E coli.  Objective: Vital signs in last 24 hours: Temp:  [97.7 F (36.5 C)-99 F (37.2 C)] 99 F (37.2 C) (11/03 1306) Pulse Rate:  [67-74] 71 (11/03 1306) Resp:  [16-18] 16 (11/03 1306) BP: (108-125)/(69-75) 125/75 (11/03 1306) SpO2:  [93 %-100 %] 100 % (11/03 1306) Weight:  [94.8 kg] 94.8 kg (11/03 0519)  Intake/Output from previous day: 11/02 0701 - 11/03 0700 In: 1300 [P.O.:1200; IV Piggyback:100] Out: 600 [Urine:600] Intake/Output this shift: Total I/O In: 360 [P.O.:360] Out: 1100 [Urine:1100]  Physical Exam:  Looks well  NAD In bed  Lab Results: Recent Labs    05/08/19 1443 05/10/19 0711  HGB 13.2 11.0*  HCT 41.8 36.7   BMET Recent Labs    05/08/19 1443 05/10/19 0711  NA 137 139  K 3.7 3.7  CL 108 108  CO2 19* 23  GLUCOSE 130* 102*  BUN 19 30*  CREATININE 1.11* 1.27*  CALCIUM 8.7* 8.5*   No results for input(s): LABPT, INR in the last 72 hours. No results for input(s): LABURIN in the last 72 hours. Results for orders placed or performed during the hospital encounter of 05/08/19  Urine C&S     Status: Abnormal   Collection Time: 05/08/19  1:36 PM   Specimen: Urine, Random  Result Value Ref Range Status   Specimen Description   Final    URINE, RANDOM Performed at [facility] [address], [place], [place] [address]    Special Requests   Final    NONE Performed at [facility], [place] [address] [place], [place] [address]    Culture >=100,000 COLONIES/mL ESCHERICHIA COLI (A)  Final   Report Status 05/10/2019 FINAL  Final   Organism ID, Bacteria ESCHERICHIA COLI (A)  Final      Susceptibility   Escherichia coli - MIC*    AMPICILLIN >=32 RESISTANT Resistant     CEFAZOLIN <=4 SENSITIVE Sensitive     CEFTRIAXONE <=1 SENSITIVE Sensitive     CIPROFLOXACIN <=0.25 SENSITIVE Sensitive      GENTAMICIN <=1 SENSITIVE Sensitive     IMIPENEM <=0.25 SENSITIVE Sensitive     NITROFURANTOIN <=16 SENSITIVE Sensitive     TRIMETH/SULFA <=20 SENSITIVE Sensitive     AMPICILLIN/SULBACTAM >=32 RESISTANT Resistant     PIP/TAZO <=4 SENSITIVE Sensitive     Extended ESBL NEGATIVE Sensitive     * >=100,000 COLONIES/mL ESCHERICHIA COLI  Blood culture (routine x 2)     Status: Abnormal (Preliminary result)   Collection Time: 05/08/19  5:33 PM   Specimen: BLOOD  Result Value Ref Range Status   Specimen Description   Final    BLOOD LEFT UPPERARM Performed at [facility] [address] [place], [place] [address]    Special Requests   Final    BOTTLES DRAWN AEROBIC AND ANAEROBIC Blood Culture results may not be optimal due to an inadequate volume of blood received in culture bottles Performed at [facility] [address] [place], [place] [address]    Culture  Setup Time   Final    GRAM NEGATIVE RODS IN BOTH AEROBIC AND ANAEROBIC BOTTLES CRITICAL RESULT CALLED TO, READ BACK BY AND VERIFIED WITH: Shelda Jakes PharmD 15:45 05/09/19 ([email or web address]) Performed at [facility], [address] [place], [place] [address]    Culture ESCHERICHIA COLI (A)  Final  Report Status PENDING  Incomplete  Blood Culture ID Panel (Reflexed)     Status: Abnormal   Collection Time: 05/08/19  5:33 PM  Result Value Ref Range Status   Enterococcus species NOT DETECTED NOT DETECTED Final   Listeria monocytogenes NOT DETECTED NOT DETECTED Final   Staphylococcus species NOT DETECTED NOT DETECTED Final   Staphylococcus aureus (BCID) NOT DETECTED NOT DETECTED Final   Streptococcus species NOT DETECTED NOT DETECTED Final   Streptococcus agalactiae NOT DETECTED NOT DETECTED Final   Streptococcus pneumoniae NOT DETECTED NOT DETECTED Final   Streptococcus pyogenes NOT DETECTED NOT DETECTED Final   Acinetobacter baumannii NOT DETECTED NOT DETECTED Final   Enterobacteriaceae species  DETECTED (A) NOT DETECTED Final    Comment: Enterobacteriaceae represent a large family of gram-negative bacteria, not a single organism. CRITICAL RESULT CALLED TO, READ BACK BY AND VERIFIED WITH: Shelda Jakes PharmD 15:45 05/09/19 ([email or web address])    Enterobacter cloacae complex NOT DETECTED NOT DETECTED Final   Escherichia coli DETECTED (A) NOT DETECTED Final    Comment: CRITICAL RESULT CALLED TO, READ BACK BY AND VERIFIED WITH: Shelda Jakes PharmD 15:45 05/09/19 ([email or web address])    Klebsiella oxytoca NOT DETECTED NOT DETECTED Final   Klebsiella pneumoniae NOT DETECTED NOT DETECTED Final   Proteus species NOT DETECTED NOT DETECTED Final   Serratia marcescens NOT DETECTED NOT DETECTED Final   Carbapenem resistance NOT DETECTED NOT DETECTED Final   Haemophilus influenzae NOT DETECTED NOT DETECTED Final   Neisseria meningitidis NOT DETECTED NOT DETECTED Final   Pseudomonas aeruginosa NOT DETECTED NOT DETECTED Final   Candida albicans NOT DETECTED NOT DETECTED Final   Candida glabrata NOT DETECTED NOT DETECTED Final   Candida krusei NOT DETECTED NOT DETECTED Final   Candida parapsilosis NOT DETECTED NOT DETECTED Final   Candida tropicalis NOT DETECTED NOT DETECTED Final    Comment: Performed at [facility], [place] [address], [place], [place] [address]  SARS Coronavirus 2 by RT PCR (hospital order, performed in [facility] hospital lab) Nasopharyngeal Nasopharyngeal Swab     Status: None   Collection Time: 05/08/19  7:14 PM   Specimen: Nasopharyngeal Swab  Result Value Ref Range Status   SARS Coronavirus 2 NEGATIVE NEGATIVE Final    Comment: (NOTE) If result is NEGATIVE SARS-CoV-2 target nucleic acids are NOT DETECTED. The SARS-CoV-2 RNA is generally detectable in upper and lower  respiratory specimens during the acute phase of infection. The lowest  concentration of SARS-CoV-2 viral copies this assay can detect is 250  copies / mL. A negative result does not preclude SARS-CoV-2 infection  and should  not be used as the sole basis for treatment or other  patient management decisions.  A negative result may occur with  improper specimen collection / handling, submission of specimen other  than nasopharyngeal swab, presence of viral mutation(s) within the  areas targeted by this assay, and inadequate number of viral copies  (<250 copies / mL). A negative result must be combined with clinical  observations, patient history, and epidemiological information. If result is POSITIVE SARS-CoV-2 target nucleic acids are DETECTED. The SARS-CoV-2 RNA is generally detectable in upper and lower  respiratory specimens dur ing the acute phase of infection.  Positive  results are indicative of active infection with SARS-CoV-2.  Clinical  correlation with patient history and other diagnostic information is  necessary to determine patient infection status.  Positive results do  not rule out bacterial infection or co-infection with other viruses. If result is PRESUMPTIVE POSTIVE  SARS-CoV-2 nucleic acids MAY BE PRESENT.   A presumptive positive result was obtained on the submitted specimen  and confirmed on repeat testing.  While 2019 novel coronavirus  (SARS-CoV-2) nucleic acids may be present in the submitted sample  additional confirmatory testing may be necessary for epidemiological  and / or clinical management purposes  to differentiate between  SARS-CoV-2 and other Sarbecovirus currently known to infect humans.  If clinically indicated additional testing with an alternate test  methodology [phone]) is advised. The SARS-CoV-2 RNA is generally  detectable in upper and lower respiratory sp ecimens during the acute  phase of infection. The expected result is Negative. Fact Sheet for Patients:  StrictlyIdeas.no Fact Sheet for Healthcare Providers: BankingDealers.co.za This test is not yet approved or cleared by the [place] FDA and has been  authorized for detection and/or diagnosis of SARS-CoV-2 by FDA under an Emergency Use Authorization (EUA).  This EUA will remain in effect (meaning this test can be used) for the duration of the COVID-19 declaration under Section 564(b)(1) of the Act, 21 U.S.C. section 360bbb-3(b)(1), unless the authorization is terminated or revoked sooner. Performed at [facility], [place] [address], [place], [place] [address]   Blood culture (routine x 2)     Status: None (Preliminary result)   Collection Time: 05/08/19 11:33 PM   Specimen: BLOOD  Result Value Ref Range Status   Specimen Description BLOOD RIGHT ANTECUBITAL  Final   Special Requests   Final    BOTTLES DRAWN AEROBIC ONLY Blood Culture adequate volume   Culture   Final    NO GROWTH 1 DAY Performed at [facility], [place] [address] [place], [place] [address]    Report Status PENDING  Incomplete    Studies/Results: Dg C-arm 1-60 Min-no Report  Result Date: 05/08/2019 Fluoroscopy was utilized by the requesting physician.  No radiographic interpretation.   Ct Renal Stone Study  Result Date: 05/08/2019 CLINICAL DATA:  Right flank pain. Concern for UTI versus kidney stone. Previous history of a kidney stone. EXAM: CT ABDOMEN AND PELVIS WITHOUT CONTRAST TECHNIQUE: Multidetector CT imaging of the abdomen and pelvis was performed following the standard protocol without IV contrast. COMPARISON:  08/06/2017. FINDINGS: Lower chest: Clear lung bases.  Heart normal in size. Hepatobiliary: Decreased attenuation of the liver consistent with fatty infiltration. No liver mass or focal lesion. Status post cholecystectomy. No bile duct dilation. Pancreas: Unremarkable. No pancreatic ductal dilatation or surrounding inflammatory changes. Spleen: Normal in size without focal abnormality. Adrenals/Urinary Tract: No adrenal masses. There is marked right hydronephrosis with right perinephric stranding. Proximal to mid right ureter is  moderately dilated. Findings are due to a stone or 2 contiguous stones, spanning a length of 5 mm, in the mid to distal ureter at the level of the pelvic brim. No additional ureteral stones. No left hydronephrosis. Normal left ureter. No renal masses. Bladder is decompressed but otherwise unremarkable. Stomach/Bowel: Small hiatal hernia.  Stomach otherwise unremarkable. Small bowel is normal caliber.  No wall thickening or inflammation. Bowel anastomosis staple line noted along the rectosigmoid junction, stable. Colon is normal in caliber. A small portion of the transverse colon enters a small midline anterior abdominal wall hernia, new from the prior exam. Colon otherwise unremarkable. Normal appendix visualized. Vascular/Lymphatic: Mild aortic atherosclerosis. No enlarged lymph nodes. Reproductive: Uterus and bilateral adnexa are unremarkable. Other: No ascites. Musculoskeletal: No fracture or acute finding. No osteoblastic or osteolytic lesions. IMPRESSION: 1. 5 mm stone versus 2 directly adjacent stones  in the mid to distal right ureter, at the pelvic brim, causes severe right hydronephrosis and moderate hydroureter. 2. No other acute findings within the abdomen or pelvis. 3. Small midline hernia containing a knuckle of the transverse colon, but without evidence of obstruction, incarceration or strangulation. This is new since the prior CT. 4. No intrarenal stones. 5. Hepatic steatosis.  Mild aortic atherosclerosis. Electronically Signed   By: Lajean Manes M.D.   On: 05/08/2019 16:15    Assessment/Plan:    UTI, ureteral stone - she can continue tamsulosin which will help stent discomfort.  I will schedule for  Right ureteroscopy in the few weeks to clear the stone out of the right ureter.  She is okay to discharge from urologic point of view when she is medically stable.   LOS: 2 days   Festus Aloe 05/10/2019, 1:34 PM

## 2019-05-10 NOTE — Plan of Care (Signed)

## 2019-05-11 DIAGNOSIS — R7881 Bacteremia: Secondary | ICD-10-CM

## 2019-05-11 DIAGNOSIS — B9620 Unspecified Escherichia coli [E. coli] as the cause of diseases classified elsewhere: Secondary | ICD-10-CM

## 2019-05-11 DIAGNOSIS — N1831 Chronic kidney disease, stage 3a: Secondary | ICD-10-CM

## 2019-05-11 LAB — CULTURE, BLOOD (ROUTINE X 2)

## 2019-05-11 LAB — CBC
HCT: 35.7 % — ABNORMAL LOW (ref 36.0–46.0)
Hemoglobin: 10.7 g/dL — ABNORMAL LOW (ref 12.0–15.0)
MCH: 31.9 pg (ref 26.0–34.0)
MCHC: 30 g/dL (ref 30.0–36.0)
MCV: 106.6 fL — ABNORMAL HIGH (ref 80.0–100.0)
Platelets: 274 10*3/uL (ref 150–400)
RBC: 3.35 MIL/uL — ABNORMAL LOW (ref 3.87–5.11)
RDW: 14.3 % (ref 11.5–15.5)
WBC: 5.3 10*3/uL (ref 4.0–10.5)
nRBC: 0 % (ref 0.0–0.2)

## 2019-05-11 LAB — BASIC METABOLIC PANEL
Anion gap: 10 (ref 5–15)
BUN: 23 mg/dL — ABNORMAL HIGH (ref 6–20)
CO2: 26 mmol/L (ref 22–32)
Calcium: 8.3 mg/dL — ABNORMAL LOW (ref 8.9–10.3)
Chloride: 106 mmol/L (ref 98–111)
Creatinine, Ser: 1.16 mg/dL — ABNORMAL HIGH (ref 0.44–1.00)
GFR calc Af Amer: 59 mL/min — ABNORMAL LOW (ref >60)
GFR calc non Af Amer: 51 mL/min — ABNORMAL LOW (ref >60)
Glucose, Bld: 125 mg/dL — ABNORMAL HIGH (ref 70–99)
Potassium: 3.4 mmol/L — ABNORMAL LOW (ref 3.5–5.1)
Sodium: 142 mmol/L (ref 135–145)

## 2019-05-11 LAB — FERRITIN: Ferritin: 80 ng/mL (ref 11–307)

## 2019-05-11 LAB — IRON AND TIBC
Iron: 16 ug/dL — ABNORMAL LOW (ref 28–170)
Saturation Ratios: 6 % — ABNORMAL LOW (ref 10.4–31.8)
TIBC: 262 ug/dL (ref 250–450)
UIBC: 246 ug/dL

## 2019-05-11 LAB — RETICULOCYTES
Immature Retic Fract: 17.1 % — ABNORMAL HIGH (ref 2.3–15.9)
RBC.: 3.43 MIL/uL — ABNORMAL LOW (ref 3.87–5.11)
Retic Count, Absolute: 20.2 10*3/uL (ref 19.0–186.0)
Retic Ct Pct: 0.6 % (ref 0.4–3.1)

## 2019-05-11 LAB — PHOSPHORUS: Phosphorus: 4.6 mg/dL (ref 2.5–4.6)

## 2019-05-11 LAB — GLUCOSE, CAPILLARY: Glucose-Capillary: 102 mg/dL — ABNORMAL HIGH (ref 70–99)

## 2019-05-11 LAB — VITAMIN B12: Vitamin B-12: 442 pg/mL (ref 180–914)

## 2019-05-11 LAB — FOLATE: Folate: 2.1 ng/mL — ABNORMAL LOW (ref >5.9)

## 2019-05-11 LAB — MAGNESIUM: Magnesium: 1.9 mg/dL (ref 1.7–2.4)

## 2019-05-11 MED ORDER — SODIUM CHLORIDE 0.9 % IV SOLN
INTRAVENOUS | Status: AC
  Administered 2019-05-11: 15:00:00 via INTRAVENOUS

## 2019-05-11 MED ORDER — POTASSIUM CHLORIDE CRYS ER 20 MEQ PO TBCR
40.0000 meq | EXTENDED_RELEASE_TABLET | Freq: Two times a day (BID) | ORAL | Status: AC
  Administered 2019-05-11 (×2): 40 meq via ORAL
  Filled 2019-05-11 (×2): qty 2

## 2019-05-11 MED ORDER — POLYSACCHARIDE IRON COMPLEX 150 MG PO CAPS
150.0000 mg | ORAL_CAPSULE | Freq: Every day | ORAL | Status: DC
  Administered 2019-05-11 – 2019-05-13 (×3): 150 mg via ORAL
  Filled 2019-05-11 (×3): qty 1

## 2019-05-11 MED ORDER — BUTALBITAL-APAP-CAFFEINE 50-325-40 MG PO TABS
1.0000 | ORAL_TABLET | Freq: Four times a day (QID) | ORAL | Status: DC | PRN
  Administered 2019-05-11 – 2019-05-13 (×3): 1 via ORAL
  Filled 2019-05-11 (×4): qty 1

## 2019-05-11 MED ORDER — SODIUM CHLORIDE 0.9 % IV BOLUS
500.0000 mL | Freq: Once | INTRAVENOUS | Status: AC
  Administered 2019-05-11: 500 mL via INTRAVENOUS

## 2019-05-11 NOTE — Progress Notes (Signed)
Pt continues to complain of a teribble headache, onset seems to be after dinner.  She askes me if she was getting lasix and I told her no that she had gotten flomax the last two night after dinner.  She informed me that she cannot take "fluid" pills most specifically flomax because it gives her headaches.  Since the beginning of night shift she has received fiorcet, imitrax and tylenol

## 2019-05-11 NOTE — Progress Notes (Signed)
Attempted to draw labs from the patient's midline and no blood return, Lab made aware at 930am

## 2019-05-11 NOTE — Progress Notes (Signed)
PROGRESS NOTE    Ann [name]  [id number] DOB: [birth year] DOA: 05/08/2019 PCP: Berkley Harvey, NP   Brief Narrative:  HPI per Dr. Otelia Limes on 05/09/2019 Ann [name] is a 63 y.o. 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  She has had similar symptoms in the past with prior ureteral stones.  She notes urine discoloration which she thought was due to Azo tabs.  She is concerned about undergoing general anesthesia, she has had difficulties with awakening after he utilization of anesthetic agents in the past.  ED Course: In the ED patient initially afebrile, tachycardic, normotensive and saturating comfortably on her home 4 L.  Labs notable for leukocytosis with WBC 13.6, normal renal function and electrolytes, CO2 19, UA showed moderate leukocytes and positive nitrites with greater than 50 WBC per hpf and many bacteria.  CT stone study showed 5 mm stone in the right ureter with severe distal hydronephrosis and moderate hydroureter.  She received IV fluids and ceftriaxone 2 g IV while in the ED.  Urology was consulted with plans for operative management of ureteral stones.  **Interim History Urinalysis and blood cultures are growing out E. coli which are only resistant to ampicillin and ampicillin/sulbactam.  Patient continues to be nauseous today and complaining of a headache so we will place on Fioricet as her headache has been persistent.  She still not feeling as well so we will monitor overnight anticipate discharge home in the next 24 to 48 hours.  Assessment & Plan:   Active  Problems:   Hydronephrosis, right  Sepsis secondary to E Coli pyelonephritis and Bacteremia E. coli septicemia Obstructing right ureteral stone with right hydronephrosis and hydroureter -Patient presenting with right-sided flank/back pain. WBC count 13.6, lactic acid 1.4. -CT renal notable for a 5 mm stone mid/distal right ureter with severe hydronephrosis and moderate hydroureter.  Urinalysis with moderate leukoesterase, positive nitrite, many bacteria, greater than 50 WBCs.  Underwent urgent cystoscopy, retrograde pyelogram, and right ureteral stent placement on 05/08/2019 by urology, Dr. Junious Silk. -Blood cultures x2: E. Coli with resistance to Ampicillin and Ampicillin/Sulbactam  -Urine culture: E. coli with resistance to ampicillin, ampicillin/sulbactam -Continue ceftriaxone 2 g IV every 24 hours for now -Pain control with oxycodone 5 mg q4h prn, Toradol 15mg  IVq6h prn  -Continue Tamsulosin 0.4 mg po qHS -Foley catheter removed per urology -Closely monitor urinary output -Urology plans repeat right ureteroscopy in a few weeks to clear out stone in the right ureter -Given 500 mL NS Bolus this AM  -C/w Supportive Care and Antiemetics with Promethazine 12.5 mg IV q6hprn -Will restart Gentle IVF Maintenance at 75 mL/hr for 16 hours -Patient continues to be a little bit nauseous and complained of a headache -Anticipate discharging home in a.m. if improved and switch to p.o. antibiotics  Essential hypertension: -BP this AM was 136/84, well controlled -Continue Amlodipine 10 mg p.o. daily -Continue Metoprolol Succinate 50 mg p.o. daily  HLD -Continue atorvastatin 10 mg p.o. nightly  History of TIAs -Continue Atorvastatin 10 mg p.o. nightly  GERD -Continue PPI with pantoprazole 40 mg p.o. daily  OSA on CPAP -Continue home CPAP  Chronic hypoxic respiratory failure  COPD -Patient on 4 L nasal cannula at home. -C/w Duoneb 3 mils nebulized every 4 hours as needed for wheezing or  shortness of breath along with continuing Dulera 200-5 mcg per actuation 2 puffs inhalations twice daily -Titrate supplemental oxygen to maintain SPO2 greater than 88% -Currently not in exacerbation  Chronic Migraine Headaches/Acute headache -Follows with [facility], Dr. Krista Blue. -Continue Topamax 75mg  BID -Has ketorolac 15 mg IV every 6 as needed for severe pain -We will start Fioricet 1 tab every 6 hours as needed for headache -Continue with oxycodone IR 5 mg every 4 hours as needed for moderate pain  CKD Stage 3 -Has a history of CKD stage III -Currently stable as BUN/creatinine went from 30/1.27 is now 23/1.16 Avoid nephrotoxic medications, contrast dyes, hypotension if possible and renally adjust medications -Continue to monitor and trend renal function -Repeat CMP in a.m.  Macrocyctic Anemia/Anemia of Chronic Kidney Disease -Patient's Hb/Hct went from 11.0/36.7 -> 10.7/35.7 and MCV this AM was 106.6 -Checked anemia panel and showed an iron level of 16, U IBC of 246, TIBC of 262, saturation ratios of 6%, ferritin of 80, folate 2.1, vitamin B12 level 442 -We will start the patient on Niferex 150 mg p.o. daily -Continue to monitor for signs and symptoms of bleeding; currently no overt bleeding noted -Repeat CBC in a.m.  Hypokalemia -Patient's potassium this morning was 3.4 Replete with p.o. potassium chloride 40 mg twice daily x2 doses -Continue to monitor and trend and replete as necessary -Repeat CMP in a.m.  Obesity -Estimated body mass index is 38.57 kg/m as calculated from the following:   Height as of this encounter: 5\' 2"  (1.575 m).   Weight as of this encounter: 95.7 kg. -Weight Loss and Dietary Counseling given  DVT prophylaxis: Enoxaparin 40 mg subcu every 24h Code Status: FULL CODE  Family Communication: No Family present at bedside  Disposition Plan: Anticipate discharging home in the next 24 to 48 hours  Consultants:   Urology     Procedures:  Cystoscopy with right retrograde pyelogram, right ureteral stent placement and Foley catheter placement done by Dr. Junious Silk on 05/08/2019   Antimicrobials:  Anti-infectives (From admission, onward)   Start     Dose/Rate Route Frequency Ordered Stop   05/09/19 2000  cefTRIAXone (ROCEPHIN) 1 g in sodium chloride 0.9 % 100 mL IVPB  Status:  Discontinued     1 g 200 mL/hr over 30 Minutes Intravenous Every 24 hours 05/08/19 2026 05/09/19 1547   05/09/19 1800  cefTRIAXone (ROCEPHIN) 2 g in sodium chloride 0.9 % 100 mL IVPB     2 g 200 mL/hr over 30 Minutes Intravenous Every 24 hours 05/09/19 1547     05/08/19 1745  cefTRIAXone (ROCEPHIN) 2 g in sodium chloride 0.9 % 100 mL IVPB     2 g 200 mL/hr over 30 Minutes Intravenous  Once 05/08/19 1733 05/08/19 2012     Subjective: Seen and examined at bedside states that she was not feeling well at all and was feeling nauseous and dry heaving but not will vomit up anything.  Also complaining about a severe headache has been persistent.  No chest pain and just generally feels fatigued and unwell.  No other concerns or complaints at this time.  Objective: Vitals:   05/10/19 1306 05/10/19 2005 05/10/19 2040 05/11/19 0529  BP: 125/75  125/64 136/84  Pulse: 71 77 87 72  Resp: 16 15 18 18   Temp: 99 F (37.2 C)  100.3 F (37.9  C) 99.6 F (37.6 C)  TempSrc: Oral  Oral Oral  SpO2: 100% 98% (!) 87% 96%  Weight:    95.7 kg  Height:        Intake/Output Summary (Last 24 hours) at 05/11/2019 1333 Last data filed at 05/11/2019 [id number] Gross per 24 hour  Intake 1060 ml  Output 800 ml  Net 260 ml   Filed Weights   05/08/19 2306 05/10/19 0519 05/11/19 0529  Weight: 93.5 kg 94.8 kg 95.7 kg   Examination: Physical Exam:  Constitutional: WN/WD obese Caucasian female in NAD and appears calm  Eyes: Lids and conjunctivae normal, sclerae anicteric  ENMT: External Ears, Nose appear normal. Grossly normal hearing.  Neck: Appears normal, supple,  no cervical masses, normal ROM, no appreciable thyromegaly; no JVD Respiratory: Diminished to auscultation bilaterally, no wheezing, rales, rhonchi or crackles. Normal respiratory effort and patient is not tachypenic. No accessory muscle use.  Cardiovascular: RRR, no murmurs / rubs / gallops. S1 and S2 auscultated. Trace extremity edema.  Abdomen: Soft, non-tender, Distended. Bowel sounds positive x4.  GU: Deferred. Musculoskeletal: No clubbing / cyanosis of digits/nails. No joint deformity upper and lower extremities.  Skin: No rashes, lesions, ulcers on a limited skin evaluation. No induration; Warm and dry.  Neurologic: CN 2-12 grossly intact with no focal deficits. Romberg sign cerebellar reflexes not assessed.  Psychiatric: Normal judgment and insight. Alert and oriented x 3. Depressed appearing mood and appropriate affect.   Data Reviewed: I have personally reviewed following labs and imaging studies  CBC: Recent Labs  Lab 05/08/19 1443 05/10/19 0711 05/11/19 0338  WBC 13.6* 8.5 5.3  HGB 13.2 11.0* 10.7*  HCT 41.8 36.7 35.7*  MCV 101.7* 105.8* 106.6*  PLT 268 256 [id number]   Basic Metabolic Panel: Recent Labs  Lab 05/08/19 1443 05/08/19 2333 05/10/19 0711 05/11/19 0338  NA 137  --  139 142  K 3.7  --  3.7 3.4*  CL 108  --  108 106  CO2 19*  --  23 26  GLUCOSE 130*  --  102* 125*  BUN 19  --  30* 23*  CREATININE 1.11*  --  1.27* 1.16*  CALCIUM 8.7*  --  8.5* 8.3*  MG  --  1.7  --  1.9  PHOS  --  3.2  --  4.6   GFR: Estimated Creatinine Clearance: 55.6 mL/min (A) (by C-G formula based on SCr of 1.16 mg/dL (H)). Liver Function Tests: No results for input(s): AST, ALT, ALKPHOS, BILITOT, PROT, ALBUMIN in the last 168 hours. No results for input(s): LIPASE, AMYLASE in the last 168 hours. No results for input(s): AMMONIA in the last 168 hours. Coagulation Profile: No results for input(s): INR, PROTIME in the last 168 hours. Cardiac Enzymes: No results for input(s):  CKTOTAL, CKMB, CKMBINDEX, TROPONINI in the last 168 hours. BNP (last 3 results) No results for input(s): PROBNP in the last 8760 hours. HbA1C: No results for input(s): HGBA1C in the last 72 hours. CBG: Recent Labs  Lab 05/09/19 0802 05/10/19 0806 05/11/19 0743  GLUCAP 117* 80 102*   Lipid Profile: No results for input(s): CHOL, HDL, LDLCALC, TRIG, CHOLHDL, LDLDIRECT in the last 72 hours. Thyroid Function Tests: No results for input(s): TSH, T4TOTAL, FREET4, T3FREE, THYROIDAB in the last 72 hours. Anemia Panel: Recent Labs    05/11/19 0338 05/11/19 0940  VITAMINB12  --  442  FOLATE  --  2.1*  FERRITIN  --  80  TIBC  --  262  IRON  --  16*  RETICCTPCT 0.6  --    Sepsis Labs: Recent Labs  Lab 05/08/19 1815 05/08/19 2333  LATICACIDVEN 1.0 0.7    Recent Results (from the past 240 hour(s))  Urine C&S     Status: Abnormal   Collection Time: 05/08/19  1:36 PM   Specimen: Urine, Random  Result Value Ref Range Status   Specimen Description   Final    URINE, RANDOM Performed at [facility] [address] [place], [place] [address]    Special Requests   Final    NONE Performed at [facility], [place] [address] [place], [place] [address]    Culture >=100,000 COLONIES/mL ESCHERICHIA COLI (A)  Final   Report Status 05/10/2019 FINAL  Final   Organism ID, Bacteria ESCHERICHIA COLI (A)  Final      Susceptibility   Escherichia coli - MIC*    AMPICILLIN >=32 RESISTANT Resistant     CEFAZOLIN <=4 SENSITIVE Sensitive     CEFTRIAXONE <=1 SENSITIVE Sensitive     CIPROFLOXACIN <=0.25 SENSITIVE Sensitive     GENTAMICIN <=1 SENSITIVE Sensitive     IMIPENEM <=0.25 SENSITIVE Sensitive     NITROFURANTOIN <=16 SENSITIVE Sensitive     TRIMETH/SULFA <=20 SENSITIVE Sensitive     AMPICILLIN/SULBACTAM >=32 RESISTANT Resistant     PIP/TAZO <=4 SENSITIVE Sensitive     Extended ESBL NEGATIVE Sensitive     * >=100,000 COLONIES/mL ESCHERICHIA COLI   Blood culture (routine x 2)     Status: Abnormal   Collection Time: 05/08/19  5:33 PM   Specimen: BLOOD  Result Value Ref Range Status   Specimen Description   Final    BLOOD LEFT UPPERARM Performed at [facility] [address] [place], [place] [address]    Special Requests   Final    BOTTLES DRAWN AEROBIC AND ANAEROBIC Blood Culture results may not be optimal due to an inadequate volume of blood received in culture bottles Performed at [facility] [address], [place], [place] [address]    Culture  Setup Time   Final    GRAM NEGATIVE RODS IN BOTH AEROBIC AND ANAEROBIC BOTTLES CRITICAL RESULT CALLED TO, READ BACK BY AND VERIFIED WITH: Shelda Jakes PharmD 15:45 05/09/19 ([email or web address]) Performed at [facility], [address] [place], [place] [address]    Culture ESCHERICHIA COLI (A)  Final   Report Status 05/11/2019 FINAL  Final   Organism ID, Bacteria ESCHERICHIA COLI  Final      Susceptibility   Escherichia coli - MIC*    AMPICILLIN >=32 RESISTANT Resistant     CEFAZOLIN <=4 SENSITIVE Sensitive     CEFEPIME <=1 SENSITIVE Sensitive     CEFTAZIDIME <=1 SENSITIVE Sensitive     CEFTRIAXONE <=1 SENSITIVE Sensitive     CIPROFLOXACIN <=0.25 SENSITIVE Sensitive     GENTAMICIN <=1 SENSITIVE Sensitive     IMIPENEM <=0.25 SENSITIVE Sensitive     TRIMETH/SULFA <=20 SENSITIVE Sensitive     AMPICILLIN/SULBACTAM >=32 RESISTANT Resistant     PIP/TAZO <=4 SENSITIVE Sensitive     Extended ESBL NEGATIVE Sensitive     * ESCHERICHIA COLI  Blood Culture ID Panel (Reflexed)     Status: Abnormal   Collection Time: 05/08/19  5:33 PM  Result Value Ref Range Status   Enterococcus species NOT DETECTED NOT DETECTED Final   Listeria monocytogenes NOT DETECTED NOT DETECTED Final   Staphylococcus species NOT DETECTED NOT DETECTED Final   Staphylococcus aureus (  BCID) NOT DETECTED NOT DETECTED Final   Streptococcus species NOT DETECTED NOT DETECTED Final    Streptococcus agalactiae NOT DETECTED NOT DETECTED Final   Streptococcus pneumoniae NOT DETECTED NOT DETECTED Final   Streptococcus pyogenes NOT DETECTED NOT DETECTED Final   Acinetobacter baumannii NOT DETECTED NOT DETECTED Final   Enterobacteriaceae species DETECTED (A) NOT DETECTED Final    Comment: Enterobacteriaceae represent a large family of gram-negative bacteria, not a single organism. CRITICAL RESULT CALLED TO, READ BACK BY AND VERIFIED WITH: Shelda Jakes PharmD 15:45 05/09/19 ([email or web address])    Enterobacter cloacae complex NOT DETECTED NOT DETECTED Final   Escherichia coli DETECTED (A) NOT DETECTED Final    Comment: CRITICAL RESULT CALLED TO, READ BACK BY AND VERIFIED WITH: Shelda Jakes PharmD 15:45 05/09/19 ([email or web address])    Klebsiella oxytoca NOT DETECTED NOT DETECTED Final   Klebsiella pneumoniae NOT DETECTED NOT DETECTED Final   Proteus species NOT DETECTED NOT DETECTED Final   Serratia marcescens NOT DETECTED NOT DETECTED Final   Carbapenem resistance NOT DETECTED NOT DETECTED Final   Haemophilus influenzae NOT DETECTED NOT DETECTED Final   Neisseria meningitidis NOT DETECTED NOT DETECTED Final   Pseudomonas aeruginosa NOT DETECTED NOT DETECTED Final   Candida albicans NOT DETECTED NOT DETECTED Final   Candida glabrata NOT DETECTED NOT DETECTED Final   Candida krusei NOT DETECTED NOT DETECTED Final   Candida parapsilosis NOT DETECTED NOT DETECTED Final   Candida tropicalis NOT DETECTED NOT DETECTED Final    Comment: Performed at [facility], [place] [address], [place], [place] [address]  SARS Coronavirus 2 by RT PCR (hospital order, performed in [facility] hospital lab) Nasopharyngeal Nasopharyngeal Swab     Status: None   Collection Time: 05/08/19  7:14 PM   Specimen: Nasopharyngeal Swab  Result Value Ref Range Status   SARS Coronavirus 2 NEGATIVE NEGATIVE Final    Comment: (NOTE) If result is NEGATIVE SARS-CoV-2 target nucleic acids are NOT DETECTED. The SARS-CoV-2 RNA is  generally detectable in upper and lower  respiratory specimens during the acute phase of infection. The lowest  concentration of SARS-CoV-2 viral copies this assay can detect is 250  copies / mL. A negative result does not preclude SARS-CoV-2 infection  and should not be used as the sole basis for treatment or other  patient management decisions.  A negative result may occur with  improper specimen collection / handling, submission of specimen other  than nasopharyngeal swab, presence of viral mutation(s) within the  areas targeted by this assay, and inadequate number of viral copies  (<250 copies / mL). A negative result must be combined with clinical  observations, patient history, and epidemiological information. If result is POSITIVE SARS-CoV-2 target nucleic acids are DETECTED. The SARS-CoV-2 RNA is generally detectable in upper and lower  respiratory specimens dur ing the acute phase of infection.  Positive  results are indicative of active infection with SARS-CoV-2.  Clinical  correlation with patient history and other diagnostic information is  necessary to determine patient infection status.  Positive results do  not rule out bacterial infection or co-infection with other viruses. If result is PRESUMPTIVE POSTIVE SARS-CoV-2 nucleic acids MAY BE PRESENT.   A presumptive positive result was obtained on the submitted specimen  and confirmed on repeat testing.  While 2019 novel coronavirus  (SARS-CoV-2) nucleic acids may be present in the submitted sample  additional confirmatory testing may be necessary for epidemiological  and / or clinical management purposes  to differentiate between  SARS-CoV-2  and other Sarbecovirus currently known to infect humans.  If clinically indicated additional testing with an alternate test  methodology [phone]) is advised. The SARS-CoV-2 RNA is generally  detectable in upper and lower respiratory sp ecimens during the acute  phase of infection.  The expected result is Negative. Fact Sheet for Patients:  StrictlyIdeas.no Fact Sheet for Healthcare Providers: BankingDealers.co.za This test is not yet approved or cleared by the [place] FDA and has been authorized for detection and/or diagnosis of SARS-CoV-2 by FDA under an Emergency Use Authorization (EUA).  This EUA will remain in effect (meaning this test can be used) for the duration of the COVID-19 declaration under Section 564(b)(1) of the Act, 21 U.S.C. section 360bbb-3(b)(1), unless the authorization is terminated or revoked sooner. Performed at [facility], [place] [address] [place], [place] [address]   Blood culture (routine x 2)     Status: None (Preliminary result)   Collection Time: 05/08/19 11:33 PM   Specimen: BLOOD  Result Value Ref Range Status   Specimen Description BLOOD RIGHT ANTECUBITAL  Final   Special Requests   Final    BOTTLES DRAWN AEROBIC ONLY Blood Culture adequate volume   Culture   Final    NO GROWTH 2 DAYS Performed at [facility], [place] [address] [place], [place] [address]    Report Status PENDING  Incomplete    Radiology Studies: No results found.  Scheduled Meds: . amLODipine  10 mg Oral Daily  . atorvastatin  10 mg Oral QHS  . docusate sodium  100 mg Oral BID  . DULoxetine  30 mg Oral Daily  . enoxaparin (LOVENOX) injection  40 mg Subcutaneous Q24H  . gabapentin  300 mg Oral TID  . iron polysaccharides  150 mg Oral Daily  . metoprolol succinate  50 mg Oral QHS  . mometasone-formoterol  2 puff Inhalation BID  . pantoprazole  40 mg Oral Daily  . polyethylene glycol  17 g Oral Daily  . potassium chloride  40 mEq Oral BID  . senna  1 tablet Oral BID  . sodium chloride flush  10-40 mL Intracatheter Q12H  . tamsulosin  0.4 mg Oral QPC supper  . topiramate  100 mg Oral BID   Continuous Infusions: . cefTRIAXone (ROCEPHIN)  IV 2 g (05/10/19 1825)    LOS: 3  days   Kerney Elbe, DO [facility] PAGER is on [place]  If 7PM-7AM, please contact night-coverage [email or web address] Password [email or web address] 05/11/2019, 1:33 PM

## 2019-05-11 NOTE — Plan of Care (Signed)

## 2019-05-11 NOTE — Progress Notes (Signed)
Patient continues to decline nocturnal CPAP tonight. Equipment remains at the bedside for futute use if she should change her mind. RT will continue tio follow.

## 2019-05-12 ENCOUNTER — Inpatient Hospital Stay (HOSPITAL_COMMUNITY): Payer: Medicaid Other

## 2019-05-12 ENCOUNTER — Encounter (HOSPITAL_COMMUNITY): Admission: RE | Admit: 2019-05-12 | Payer: Medicaid Other | Source: Ambulatory Visit

## 2019-05-12 DIAGNOSIS — R945 Abnormal results of liver function studies: Secondary | ICD-10-CM

## 2019-05-12 DIAGNOSIS — R110 Nausea: Secondary | ICD-10-CM

## 2019-05-12 LAB — COMPREHENSIVE METABOLIC PANEL
ALT: 99 U/L — ABNORMAL HIGH (ref 0–44)
AST: 83 U/L — ABNORMAL HIGH (ref 15–41)
Albumin: 2.8 g/dL — ABNORMAL LOW (ref 3.5–5.0)
Alkaline Phosphatase: 105 U/L (ref 38–126)
Anion gap: 9 (ref 5–15)
BUN: 12 mg/dL (ref 6–20)
CO2: 25 mmol/L (ref 22–32)
Calcium: 8.6 mg/dL — ABNORMAL LOW (ref 8.9–10.3)
Chloride: 109 mmol/L (ref 98–111)
Creatinine, Ser: 0.87 mg/dL (ref 0.44–1.00)
GFR calc Af Amer: >60 mL/min (ref >60)
GFR calc non Af Amer: >60 mL/min (ref >60)
Glucose, Bld: 101 mg/dL — ABNORMAL HIGH (ref 70–99)
Potassium: 4.2 mmol/L (ref 3.5–5.1)
Sodium: 143 mmol/L (ref 135–145)
Total Bilirubin: 0.5 mg/dL (ref 0.3–1.2)
Total Protein: 6.5 g/dL (ref 6.5–8.1)

## 2019-05-12 LAB — CBC WITH DIFFERENTIAL/PLATELET
Abs Immature Granulocytes: 0.01 10*3/uL (ref 0.00–0.07)
Basophils Absolute: 0 10*3/uL (ref 0.0–0.1)
Basophils Relative: 1 %
Eosinophils Absolute: 0.2 10*3/uL (ref 0.0–0.5)
Eosinophils Relative: 3 %
HCT: 37.2 % (ref 36.0–46.0)
Hemoglobin: 11.3 g/dL — ABNORMAL LOW (ref 12.0–15.0)
Immature Granulocytes: 0 %
Lymphocytes Relative: 38 %
Lymphs Abs: 1.8 10*3/uL (ref 0.7–4.0)
MCH: 31.7 pg (ref 26.0–34.0)
MCHC: 30.4 g/dL (ref 30.0–36.0)
MCV: 104.2 fL — ABNORMAL HIGH (ref 80.0–100.0)
Monocytes Absolute: 0.4 10*3/uL (ref 0.1–1.0)
Monocytes Relative: 9 %
Neutro Abs: 2.3 10*3/uL (ref 1.7–7.7)
Neutrophils Relative %: 49 %
Platelets: 289 10*3/uL (ref 150–400)
RBC: 3.57 MIL/uL — ABNORMAL LOW (ref 3.87–5.11)
RDW: 13.8 % (ref 11.5–15.5)
WBC: 4.7 10*3/uL (ref 4.0–10.5)
nRBC: 0 % (ref 0.0–0.2)

## 2019-05-12 LAB — HEPATITIS PANEL, ACUTE
HCV Ab: NONREACTIVE
Hep A IgM: NONREACTIVE
Hep B C IgM: NONREACTIVE
Hepatitis B Surface Ag: NONREACTIVE

## 2019-05-12 LAB — PHOSPHORUS: Phosphorus: 3.6 mg/dL (ref 2.5–4.6)

## 2019-05-12 LAB — GLUCOSE, CAPILLARY: Glucose-Capillary: 89 mg/dL (ref 70–99)

## 2019-05-12 LAB — MAGNESIUM: Magnesium: 1.8 mg/dL (ref 1.7–2.4)

## 2019-05-12 MED ORDER — ONDANSETRON HCL 4 MG/2ML IJ SOLN: 4.0000 mg | Freq: Four times a day (QID) | INTRAMUSCULAR | Status: DC | PRN

## 2019-05-12 MED ORDER — KETOROLAC TROMETHAMINE 30 MG/ML IJ SOLN
30.0000 mg | Freq: Once | INTRAMUSCULAR | Status: AC
  Administered 2019-05-12: 30 mg via INTRAVENOUS
  Filled 2019-05-12: qty 1

## 2019-05-12 MED ORDER — SODIUM CHLORIDE 0.9 % IV SOLN: INTRAVENOUS | Status: DC

## 2019-05-12 NOTE — Progress Notes (Signed)
PROGRESS NOTE    Ann [name]  [id number] DOB: [birth year] DOA: 05/08/2019 PCP: Berkley Harvey, NP   Brief Narrative:  HPI per Dr. Otelia Limes on 05/09/2019 Ann [name] is a 63 y.o. 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  She has had similar symptoms in the past with prior ureteral stones.  She notes urine discoloration which she thought was due to Azo tabs.  She is concerned about undergoing general anesthesia, she has had difficulties with awakening after he utilization of anesthetic agents in the past.  ED Course: In the ED patient initially afebrile, tachycardic, normotensive and saturating comfortably on her home 4 L.  Labs notable for leukocytosis with WBC 13.6, normal renal function and electrolytes, CO2 19, UA showed moderate leukocytes and positive nitrites with greater than 50 WBC per hpf and many bacteria.  CT stone study showed 5 mm stone in the right ureter with severe distal hydronephrosis and moderate hydroureter.  She received IV fluids and ceftriaxone 2 g IV while in the ED.  Urology was consulted with plans for operative management of ureteral stones.  **Interim History Urinalysis and blood cultures are growing out E. coli which are only resistant to ampicillin and ampicillin/sulbactam.  Patient continues to be nauseous today and complaining of a headache so we will place on Fioricet as her headache has been persistent.  She still not feeling as well again and continues to be nauseous. Will continue Antiemetics. Patient complaining of back pain now and also has  Abnormal LFT's that we will work up.   Assessment & Plan:   Active Problems:   Hydronephrosis, right  Sepsis secondary to E Coli pyelonephritis and Bacteremia E. coli septicemia Obstructing right ureteral stone with right hydronephrosis and hydroureter -Patient presenting with right-sided flank/back pain. WBC count 13.6, lactic acid 1.4. -CT renal notable for a 5 mm stone mid/distal right ureter with severe hydronephrosis and moderate hydroureter.  Urinalysis with moderate leukoesterase, positive nitrite, many bacteria, greater than 50 WBCs.  Underwent urgent cystoscopy, retrograde pyelogram, and right ureteral stent placement on 05/08/2019 by urology, Dr. Junious Silk. -Blood cultures x2: E. Coli with resistance to Ampicillin and Ampicillin/Sulbactam  -Urine culture: E. coli with resistance to ampicillin, ampicillin/sulbactam -Continue ceftriaxone 2 g IV every 24 hours for now and transition to po Cefuroxime at D/C  -Pain control with oxycodone 5 mg q4h prn, Toradol 15mg  IVq6h prn  -Continue Tamsulosin 0.4 mg po qHS -Foley catheter removed per urology -Closely monitor urinary output -Urology plans repeat right ureteroscopy in a few weeks to clear out stone in the right ureter -Given 500 mL NS Bolus yesterday AM  -C/w Supportive Care and Antiemetics with Promethazine 12.5 mg IV q6hprn -Will restart Gentle IVF Maintenance at 75 mL/hr for 16 hours again -Patient continues to be nauseous and complained of a headache and now a backache -Anticipate discharging home in a.m. if improved and switch to p.o. antibiotics -C/w Supportive Care and with Antiemetics and have added IV Zofran   Essential hypertension: -BP this AM was 145/79, well controlled -Continue Amlodipine 10 mg p.o. daily -Continue Metoprolol Succinate 50 mg p.o. daily  HLD -Will hold Atorvastatin 10 mg  p.o. nightly given increased LFTs  History of TIAs -Will hold Atorvastatin 10 mg p.o. nightly given increased LFTs  GERD  -Continue PPI with pantoprazole 40 mg p.o. daily  OSA on CPAP -Continue home CPAP  Chronic hypoxic respiratory failure COPD -Patient on 4 L nasal cannula at home. -C/w Duoneb 3 mils nebulized every 4 hours as needed for wheezing or shortness of breath along with continuing Dulera 200-5 mcg per actuation 2 puffs inhalations twice daily -Titrate supplemental oxygen to maintain SPO2 greater than 88% -Currently not in exacerbation  Chronic Migraine Headaches/Acute headache -Follows with [facility], Dr. Krista Blue. -Continue Topamax 75mg  BID -Has ketorolac 15 mg IV every 6 as needed for severe pain; Given 1x of 30 mg  -We will start Fioricet 1 tab every 6 hours as needed for headache -Continue with oxycodone IR 5 mg every 4 hours as needed for moderate pain  CKD Stage 3, improved  -Has a history of CKD stage III -Currently stable as BUN/creatinine went from 30/1.27 is now 12/0.87 Avoid nephrotoxic medications, contrast dyes, hypotension if possible and renally adjust medications -IVF as above  -Continue to monitor and trend renal function -Repeat CMP in a.m.  Macrocyctic Anemia/Anemia of Chronic Kidney Disease -Patient's Hb/Hct went from 11.0/36.7 -> 10.7/35.7 -> 11.3/37.2 and MCV this AM was 104.2 -Checked anemia panel and showed an iron level of 16, U IBC of 246, TIBC of 262, saturation ratios of 6%, ferritin of 80, folate 2.1, vitamin B12 level 442 -We will start the patient on Niferex 150 mg p.o. daily -Continue to monitor for signs and symptoms of bleeding; currently no overt bleeding noted -Repeat CBC in a.m.  Hypokalemia -Patient's potassium this morning was 4.2 -Continue to monitor and trend and replete as necessary -Repeat CMP in a.m.  Obesity -Estimated body mass index is 38.43 kg/m as calculated from the following:   Height as of this encounter: 5\' 2"  (1.575 m).   Weight as of this encounter: 95.3 kg. -Weight Loss and Dietary Counseling given   Abnormal LFT's -Patient's AST was 83 and ALT was 99 -Checked RUQ and showed "Fatty liver. Cholecystectomy. Patent main portal vein with hepatopetal flow."  -Acute Hepatitis Panel Non-Reactive  -Continue to Monitor and Trend Hepatic Function Panel -Repeat CMP in AM   Nausea -Persistent  -C/w Phenergan and started the Patient on IV Zofran as well -C/w Supportive Care and Antiemetics; -C/w IVF Hydration today -If not improving may need to discuss with Gastroenterology   DVT prophylaxis: Enoxaparin 40 mg subcu every 24h Code Status: FULL CODE  Family Communication: No Family present at bedside  Disposition Plan: Anticipate discharging home in the next 24 to 48 hours if Nausea is improved and LFT's are trending down   Consultants:   Urology    Procedures:  Cystoscopy with right retrograde pyelogram, right ureteral stent placement and Foley catheter placement done by Dr. Junious Silk on 05/08/2019   Antimicrobials:  Anti-infectives (From admission, onward)   Start     Dose/Rate Route Frequency Ordered Stop   05/09/19 2000  cefTRIAXone (ROCEPHIN) 1 g in sodium chloride 0.9 % 100 mL IVPB  Status:  Discontinued     1 g 200 mL/hr over 30 Minutes Intravenous Every 24 hours 05/08/19 2026 05/09/19 1547   05/09/19 1800  cefTRIAXone (ROCEPHIN) 2 g in sodium chloride 0.9 % 100 mL IVPB     2 g 200 mL/hr over 30 Minutes Intravenous Every 24 hours 05/09/19 1547     05/08/19 1745  cefTRIAXone (  ROCEPHIN) 2 g in sodium chloride 0.9 % 100 mL IVPB     2 g 200 mL/hr over 30 Minutes Intravenous  Once 05/08/19 1733 05/08/19 2012     Subjective: Seen and examined at bedside again she states that she is not feeling well with complaints of back pain, headache, as well being extremely nauseous.  Has not actually vomited.  I discussed with her about her abnormal LFTs and she was currently getting a RUQ U/S.  No other concerns complaints at this time.  Objective: Vitals:   05/12/19 0530 05/12/19 0756  05/12/19 1026 05/12/19 1457  BP: 123/68  125/62 (!) 145/79  Pulse: (!) 51   (!) 59  Resp: 16     Temp: 98.2 F (36.8 C)   97.8 F (36.6 C)  TempSrc: Oral   Oral  SpO2: 94% 95%  94%  Weight: 95.3 kg     Height:        Intake/Output Summary (Last 24 hours) at 05/12/2019 1523 Last data filed at 05/12/2019 [id number] Gross per 24 hour  Intake 465.33 ml  Output 2750 ml  Net -2284.67 ml   Filed Weights   05/10/19 0519 05/11/19 0529 05/12/19 0530  Weight: 94.8 kg 95.7 kg 95.3 kg   Examination: Physical Exam:  Constitutional: WN/WD obese Caucasian female in NAD but does appear uncomfortable and fatigued Eyes: Lids and conjunctivae normal, sclerae anicteric  ENMT: External Ears, Nose appear normal. Grossly normal hearing. Mucous membranes are moist. Neck: Appears normal, supple, no cervical masses, normal ROM, no appreciable thyromegaly; no JVD Respiratory: Diminished to auscultation bilaterally, no wheezing, rales, rhonchi or crackles. Normal respiratory effort and patient is not tachypenic. No accessory muscle use.  Cardiovascular: RRR, no murmurs / rubs / gallops. S1 and S2 auscultated. Mild extremity edema.  Abdomen: Soft, non-tender, Distended due to body habitus. No masses palpated. No appreciable hepatosplenomegaly. Bowel sounds positive.  GU: Deferred. Musculoskeletal: No clubbing / cyanosis of digits/nails. No joint deformity upper and lower extremities. Skin: No rashes, lesions, ulcers on a limited skin evaluation. No induration; Warm and dry.  Neurologic: CN 2-12 grossly intact with no focal deficits. Romberg sign and cerebellar reflexes not assessed.  Psychiatric: Normal judgment and insight. Alert and oriented x 3. Depressed appearing mood and flat affect.   Data Reviewed: I have personally reviewed following labs and imaging studies  CBC: Recent Labs  Lab 05/08/19 1443 05/10/19 0711 05/11/19 0338 05/12/19 0506  WBC 13.6* 8.5 5.3 4.7  NEUTROABS  --   --   --  2.3  HGB  13.2 11.0* 10.7* 11.3*  HCT 41.8 36.7 35.7* 37.2  MCV 101.7* 105.8* 106.6* 104.2*  PLT 268 256 274 [id number]   Basic Metabolic Panel: Recent Labs  Lab 05/08/19 1443 05/08/19 2333 05/10/19 0711 05/11/19 0338 05/12/19 0506  NA 137  --  139 142 143  K 3.7  --  3.7 3.4* 4.2  CL 108  --  108 106 109  CO2 19*  --  23 26 25   GLUCOSE 130*  --  102* 125* 101*  BUN 19  --  30* 23* 12  CREATININE 1.11*  --  1.27* 1.16* 0.87  CALCIUM 8.7*  --  8.5* 8.3* 8.6*  MG  --  1.7  --  1.9 1.8  PHOS  --  3.2  --  4.6 3.6   GFR: Estimated Creatinine Clearance: 74 mL/min (by C-G formula based on SCr of 0.87 mg/dL). Liver Function Tests: Recent Labs  Lab 05/12/19 [phone]  AST 83*  ALT 99*  ALKPHOS 105  BILITOT 0.5  PROT 6.5  ALBUMIN 2.8*   No results for input(s): LIPASE, AMYLASE in the last 168 hours. No results for input(s): AMMONIA in the last 168 hours. Coagulation Profile: No results for input(s): INR, PROTIME in the last 168 hours. Cardiac Enzymes: No results for input(s): CKTOTAL, CKMB, CKMBINDEX, TROPONINI in the last 168 hours. BNP (last 3 results) No results for input(s): PROBNP in the last 8760 hours. HbA1C: No results for input(s): HGBA1C in the last 72 hours. CBG: Recent Labs  Lab 05/09/19 0802 05/10/19 0806 05/11/19 0743 05/12/19 0757  GLUCAP 117* 80 102* 89   Lipid Profile: No results for input(s): CHOL, HDL, LDLCALC, TRIG, CHOLHDL, LDLDIRECT in the last 72 hours. Thyroid Function Tests: No results for input(s): TSH, T4TOTAL, FREET4, T3FREE, THYROIDAB in the last 72 hours. Anemia Panel: Recent Labs    05/11/19 0338 05/11/19 0940  VITAMINB12  --  442  FOLATE  --  2.1*  FERRITIN  --  80  TIBC  --  262  IRON  --  16*  RETICCTPCT 0.6  --    Sepsis Labs: Recent Labs  Lab 05/08/19 1815 05/08/19 2333  LATICACIDVEN 1.0 0.7    Recent Results (from the past 240 hour(s))  Urine C&S     Status: Abnormal   Collection Time: 05/08/19  1:36 PM   Specimen: Urine, Random   Result Value Ref Range Status   Specimen Description   Final    URINE, RANDOM Performed at [facility] [address], [place], [place] [address]    Special Requests   Final    NONE Performed at [facility], [place] [address] [place], [place] [address]    Culture >=100,000 COLONIES/mL ESCHERICHIA COLI (A)  Final   Report Status 05/10/2019 FINAL  Final   Organism ID, Bacteria ESCHERICHIA COLI (A)  Final      Susceptibility   Escherichia coli - MIC*    AMPICILLIN >=32 RESISTANT Resistant     CEFAZOLIN <=4 SENSITIVE Sensitive     CEFTRIAXONE <=1 SENSITIVE Sensitive     CIPROFLOXACIN <=0.25 SENSITIVE Sensitive     GENTAMICIN <=1 SENSITIVE Sensitive     IMIPENEM <=0.25 SENSITIVE Sensitive     NITROFURANTOIN <=16 SENSITIVE Sensitive     TRIMETH/SULFA <=20 SENSITIVE Sensitive     AMPICILLIN/SULBACTAM >=32 RESISTANT Resistant     PIP/TAZO <=4 SENSITIVE Sensitive     Extended ESBL NEGATIVE Sensitive     * >=100,000 COLONIES/mL ESCHERICHIA COLI  Blood culture (routine x 2)     Status: Abnormal   Collection Time: 05/08/19  5:33 PM   Specimen: BLOOD  Result Value Ref Range Status   Specimen Description   Final    BLOOD LEFT UPPERARM Performed at [facility] [address], [place], [place] [address]    Special Requests   Final    BOTTLES DRAWN AEROBIC AND ANAEROBIC Blood Culture results may not be optimal due to an inadequate volume of blood received in culture bottles Performed at [facility] [address] [place], [place] [address]    Culture  Setup Time   Final    GRAM NEGATIVE RODS IN BOTH AEROBIC AND ANAEROBIC BOTTLES CRITICAL RESULT CALLED TO, READ BACK BY AND VERIFIED WITH: Shelda Jakes PharmD 15:45 05/09/19 ([email or web address]) Performed at [facility], [address] [place],  [address]    Culture ESCHERICHIA COLI (A)  Final  Report Status 05/11/2019 FINAL  Final   Organism ID, Bacteria  ESCHERICHIA COLI  Final      Susceptibility   Escherichia coli - MIC*    AMPICILLIN >=32 RESISTANT Resistant     CEFAZOLIN <=4 SENSITIVE Sensitive     CEFEPIME <=1 SENSITIVE Sensitive     CEFTAZIDIME <=1 SENSITIVE Sensitive     CEFTRIAXONE <=1 SENSITIVE Sensitive     CIPROFLOXACIN <=0.25 SENSITIVE Sensitive     GENTAMICIN <=1 SENSITIVE Sensitive     IMIPENEM <=0.25 SENSITIVE Sensitive     TRIMETH/SULFA <=20 SENSITIVE Sensitive     AMPICILLIN/SULBACTAM >=32 RESISTANT Resistant     PIP/TAZO <=4 SENSITIVE Sensitive     Extended ESBL NEGATIVE Sensitive     * ESCHERICHIA COLI  Blood Culture ID Panel (Reflexed)     Status: Abnormal   Collection Time: 05/08/19  5:33 PM  Result Value Ref Range Status   Enterococcus species NOT DETECTED NOT DETECTED Final   Listeria monocytogenes NOT DETECTED NOT DETECTED Final   Staphylococcus species NOT DETECTED NOT DETECTED Final   Staphylococcus aureus (BCID) NOT DETECTED NOT DETECTED Final   Streptococcus species NOT DETECTED NOT DETECTED Final   Streptococcus agalactiae NOT DETECTED NOT DETECTED Final   Streptococcus pneumoniae NOT DETECTED NOT DETECTED Final   Streptococcus pyogenes NOT DETECTED NOT DETECTED Final   Acinetobacter baumannii NOT DETECTED NOT DETECTED Final   Enterobacteriaceae species DETECTED (A) NOT DETECTED Final    Comment: Enterobacteriaceae represent a large family of gram-negative bacteria, not a single organism. CRITICAL RESULT CALLED TO, READ BACK BY AND VERIFIED WITH: Shelda Jakes PharmD 15:45 05/09/19 ([email or web address])    Enterobacter cloacae complex NOT DETECTED NOT DETECTED Final   Escherichia coli DETECTED (A) NOT DETECTED Final    Comment: CRITICAL RESULT CALLED TO, READ BACK BY AND VERIFIED WITH: Shelda Jakes PharmD 15:45 05/09/19 ([email or web address])    Klebsiella oxytoca NOT DETECTED NOT DETECTED Final   Klebsiella pneumoniae NOT DETECTED NOT DETECTED Final   Proteus species NOT DETECTED NOT DETECTED Final   Serratia marcescens NOT  DETECTED NOT DETECTED Final   Carbapenem resistance NOT DETECTED NOT DETECTED Final   Haemophilus influenzae NOT DETECTED NOT DETECTED Final   Neisseria meningitidis NOT DETECTED NOT DETECTED Final   Pseudomonas aeruginosa NOT DETECTED NOT DETECTED Final   Candida albicans NOT DETECTED NOT DETECTED Final   Candida glabrata NOT DETECTED NOT DETECTED Final   Candida krusei NOT DETECTED NOT DETECTED Final   Candida parapsilosis NOT DETECTED NOT DETECTED Final   Candida tropicalis NOT DETECTED NOT DETECTED Final    Comment: Performed at [facility], [place] [address], [place], [place] [address]  SARS Coronavirus 2 by RT PCR (hospital order, performed in [facility] hospital lab) Nasopharyngeal Nasopharyngeal Swab     Status: None   Collection Time: 05/08/19  7:14 PM   Specimen: Nasopharyngeal Swab  Result Value Ref Range Status   SARS Coronavirus 2 NEGATIVE NEGATIVE Final    Comment: (NOTE) If result is NEGATIVE SARS-CoV-2 target nucleic acids are NOT DETECTED. The SARS-CoV-2 RNA is generally detectable in upper and lower  respiratory specimens during the acute phase of infection. The lowest  concentration of SARS-CoV-2 viral copies this assay can detect is 250  copies / mL. A negative result does not preclude SARS-CoV-2 infection  and should not be used as the sole basis for treatment or other  patient management decisions.  A negative result may occur with  improper specimen collection / handling, submission of  specimen other  than nasopharyngeal swab, presence of viral mutation(s) within the  areas targeted by this assay, and inadequate number of viral copies  (<250 copies / mL). A negative result must be combined with clinical  observations, patient history, and epidemiological information. If result is POSITIVE SARS-CoV-2 target nucleic acids are DETECTED. The SARS-CoV-2 RNA is generally detectable in upper and lower  respiratory specimens dur ing the acute phase of infection.   Positive  results are indicative of active infection with SARS-CoV-2.  Clinical  correlation with patient history and other diagnostic information is  necessary to determine patient infection status.  Positive results do  not rule out bacterial infection or co-infection with other viruses. If result is PRESUMPTIVE POSTIVE SARS-CoV-2 nucleic acids MAY BE PRESENT.   A presumptive positive result was obtained on the submitted specimen  and confirmed on repeat testing.  While 2019 novel coronavirus  (SARS-CoV-2) nucleic acids may be present in the submitted sample  additional confirmatory testing may be necessary for epidemiological  and / or clinical management purposes  to differentiate between  SARS-CoV-2 and other Sarbecovirus currently known to infect humans.  If clinically indicated additional testing with an alternate test  methodology [phone]) is advised. The SARS-CoV-2 RNA is generally  detectable in upper and lower respiratory sp ecimens during the acute  phase of infection. The expected result is Negative. Fact Sheet for Patients:  StrictlyIdeas.no Fact Sheet for Healthcare Providers: BankingDealers.co.za This test is not yet approved or cleared by the [place] FDA and has been authorized for detection and/or diagnosis of SARS-CoV-2 by FDA under an Emergency Use Authorization (EUA).  This EUA will remain in effect (meaning this test can be used) for the duration of the COVID-19 declaration under Section 564(b)(1) of the Act, 21 U.S.C. section 360bbb-3(b)(1), unless the authorization is terminated or revoked sooner. Performed at [facility], [place] [address], [place], [place] [address]   Blood culture (routine x 2)     Status: None (Preliminary result)   Collection Time: 05/08/19 11:33 PM   Specimen: BLOOD  Result Value Ref Range Status   Specimen Description BLOOD RIGHT ANTECUBITAL  Final   Special  Requests   Final    BOTTLES DRAWN AEROBIC ONLY Blood Culture adequate volume   Culture   Final    NO GROWTH 3 DAYS Performed at [facility] [address], [place], [place] [address]    Report Status PENDING  Incomplete    Radiology Studies: US Abdomen Limited Ruq  Result Date: 05/12/2019 CLINICAL DATA:  63 year old female with abnormal LFTs. History of cholecystectomy. EXAM: ULTRASOUND ABDOMEN LIMITED RIGHT UPPER QUADRANT COMPARISON:  CT of the abdomen pelvis dated 05/08/2019 FINDINGS: Gallbladder: Cholecystectomy. Common bile duct: Diameter: 8 mm Liver: Mild diffuse increased liver echogenicity most commonly seen in the setting of fatty infiltration. Superimposed inflammation or fibrosis is not excluded. Clinical correlation is recommended. Portal vein is patent on color Doppler imaging with normal direction of blood flow towards the liver. Other: None. IMPRESSION: 1. Fatty liver. 2. Cholecystectomy. 3. Patent main portal vein with hepatopetal flow. Electronically Signed   By: Anner Crete M.D.   On: 05/12/2019 11:07    Scheduled Meds: . amLODipine  10 mg Oral Daily  . atorvastatin  10 mg Oral QHS  . docusate sodium  100 mg Oral BID  . DULoxetine  30 mg Oral Daily  . enoxaparin (LOVENOX) injection  40 mg Subcutaneous Q24H  . gabapentin  300 mg Oral  TID  . iron polysaccharides  150 mg Oral Daily  . metoprolol succinate  50 mg Oral QHS  . mometasone-formoterol  2 puff Inhalation BID  . pantoprazole  40 mg Oral Daily  . polyethylene glycol  17 g Oral Daily  . senna  1 tablet Oral BID  . sodium chloride flush  10-40 mL Intracatheter Q12H  . tamsulosin  0.4 mg Oral QPC supper  . topiramate  100 mg Oral BID   Continuous Infusions: . sodium chloride 75 mL/hr at 05/12/19 1220  . cefTRIAXone (ROCEPHIN)  IV 2 g (05/11/19 1758)    LOS: 4 days   Kerney Elbe, DO [facility] PAGER is on AMION  If 7PM-7AM, please contact night-coverage [email or web address] Password  [facility] 05/12/2019, 3:23 PM

## 2019-05-12 NOTE — Progress Notes (Signed)
Pt declined cpap tonight.  Machine remains in room on standby.  Pt was encouraged to call should she changer her mind.

## 2019-05-13 LAB — CBC WITH DIFFERENTIAL/PLATELET
Abs Immature Granulocytes: 0.02 10*3/uL (ref 0.00–0.07)
Basophils Absolute: 0 10*3/uL (ref 0.0–0.1)
Basophils Relative: 1 %
Eosinophils Absolute: 0.2 10*3/uL (ref 0.0–0.5)
Eosinophils Relative: 4 %
HCT: 37 % (ref 36.0–46.0)
Hemoglobin: 11.3 g/dL — ABNORMAL LOW (ref 12.0–15.0)
Immature Granulocytes: 0 %
Lymphocytes Relative: 35 %
Lymphs Abs: 2 10*3/uL (ref 0.7–4.0)
MCH: 31.7 pg (ref 26.0–34.0)
MCHC: 30.5 g/dL (ref 30.0–36.0)
MCV: 103.6 fL — ABNORMAL HIGH (ref 80.0–100.0)
Monocytes Absolute: 0.4 10*3/uL (ref 0.1–1.0)
Monocytes Relative: 8 %
Neutro Abs: 2.9 10*3/uL (ref 1.7–7.7)
Neutrophils Relative %: 52 %
Platelets: 299 10*3/uL (ref 150–400)
RBC: 3.57 MIL/uL — ABNORMAL LOW (ref 3.87–5.11)
RDW: 13.7 % (ref 11.5–15.5)
WBC: 5.6 10*3/uL (ref 4.0–10.5)
nRBC: 0 % (ref 0.0–0.2)

## 2019-05-13 LAB — COMPREHENSIVE METABOLIC PANEL
ALT: 77 U/L — ABNORMAL HIGH (ref 0–44)
AST: 36 U/L (ref 15–41)
Albumin: 2.8 g/dL — ABNORMAL LOW (ref 3.5–5.0)
Alkaline Phosphatase: 109 U/L (ref 38–126)
Anion gap: 9 (ref 5–15)
BUN: 10 mg/dL (ref 6–20)
CO2: 26 mmol/L (ref 22–32)
Calcium: 8.5 mg/dL — ABNORMAL LOW (ref 8.9–10.3)
Chloride: 107 mmol/L (ref 98–111)
Creatinine, Ser: 0.78 mg/dL (ref 0.44–1.00)
GFR calc Af Amer: >60 mL/min (ref >60)
GFR calc non Af Amer: >60 mL/min (ref >60)
Glucose, Bld: 90 mg/dL (ref 70–99)
Potassium: 3.8 mmol/L (ref 3.5–5.1)
Sodium: 142 mmol/L (ref 135–145)
Total Bilirubin: 0.7 mg/dL (ref 0.3–1.2)
Total Protein: 6.6 g/dL (ref 6.5–8.1)

## 2019-05-13 LAB — PHOSPHORUS: Phosphorus: 3.9 mg/dL (ref 2.5–4.6)

## 2019-05-13 LAB — GLUCOSE, CAPILLARY: Glucose-Capillary: 84 mg/dL (ref 70–99)

## 2019-05-13 LAB — MAGNESIUM: Magnesium: 1.7 mg/dL (ref 1.7–2.4)

## 2019-05-13 MED ORDER — SENNA 8.6 MG PO TABS: 1.0000 | ORAL_TABLET | Freq: Two times a day (BID) | ORAL | 0 refills | Status: DC

## 2019-05-13 MED ORDER — CEFUROXIME AXETIL 500 MG PO TABS
500.0000 mg | ORAL_TABLET | Freq: Two times a day (BID) | ORAL | Status: DC
  Administered 2019-05-13: 500 mg via ORAL
  Filled 2019-05-13 (×2): qty 1

## 2019-05-13 MED ORDER — POLYETHYLENE GLYCOL 3350 17 G PO PACK: 17.0000 g | PACK | Freq: Every day | ORAL | 0 refills | Status: DC

## 2019-05-13 MED ORDER — OXYCODONE HCL 5 MG PO TABS: 5.0000 mg | ORAL_TABLET | ORAL | 0 refills | Status: DC | PRN

## 2019-05-13 MED ORDER — DOCUSATE SODIUM 100 MG PO CAPS: 100.0000 mg | ORAL_CAPSULE | Freq: Two times a day (BID) | ORAL | 0 refills | Status: DC

## 2019-05-13 MED ORDER — CEFUROXIME AXETIL 500 MG PO TABS: 500.0000 mg | ORAL_TABLET | Freq: Two times a day (BID) | ORAL | 0 refills | Status: AC

## 2019-05-13 MED ORDER — POLYSACCHARIDE IRON COMPLEX 150 MG PO CAPS: 150.0000 mg | ORAL_CAPSULE | Freq: Every day | ORAL | 0 refills | Status: DC

## 2019-05-13 MED ORDER — MAGNESIUM SULFATE 2 GM/50ML IV SOLN
2.0000 g | Freq: Once | INTRAVENOUS | Status: AC
  Administered 2019-05-13: 2 g via INTRAVENOUS
  Filled 2019-05-13: qty 50

## 2019-05-13 MED ORDER — SODIUM CHLORIDE 0.9 % IV SOLN
INTRAVENOUS | Status: DC | PRN
  Administered 2019-05-13: 250 mL via INTRAVENOUS

## 2019-05-13 MED ORDER — ONDANSETRON 4 MG PO TBDP: 4.0000 mg | ORAL_TABLET | Freq: Three times a day (TID) | ORAL | 0 refills | Status: DC | PRN

## 2019-05-13 MED ORDER — TAMSULOSIN HCL 0.4 MG PO CAPS: 0.4000 mg | ORAL_CAPSULE | Freq: Every day | ORAL | 0 refills | Status: DC

## 2019-05-13 NOTE — Discharge Summary (Signed)
Physician Discharge Summary  EDNAMAE LYE [id number] DOB: 04-24-1956 DOA: 05/08/2019  PCP: Berkley Harvey, NP  Admit date: 05/08/2019 Discharge date: 05/13/2019  Admitted From: Home Disposition: Home  Recommendations for Outpatient Follow-up:  1. Follow up with PCP in 1-2 weeks 2. Follow up with Urology within 1-2 weeks 3. Please obtain BMP/CBC in one week 4. Please follow up on the following pending results:  Home Health: No Equipment/Devices: None    Discharge Condition: Stable  CODE STATUS: FULL CODE  Diet recommendation: Heart Healthy Diet   Brief/Interim Summary: HPI per Dr. Otelia Limes on 05/09/2019 Karsten Fells Woodis a 63 y.o.femalewith medical history significant forchronic nephroureterolithiasis with multiple urologic procedures in the past including stents, HFrEF, CVA, OSA on CPAP, morbid obesity, CKD stage III, chronic hypoxic respiratory failure on 4 L submental oxygen, hypertension, hyperlipidemia and GERD who presented to the emergency department with multiple days of fever, chills, nausea, foul-smelling urine, and onset of worsening right flank and suprapubic pain. She has had similar symptoms in the past with prior ureteral stones. She notes urine discoloration which she thought was due to Azo tabs. She is concerned about undergoing general anesthesia, she has had difficulties with awakening after he utilization of anesthetic agents in the past.  ED Course:In the ED patient initially afebrile, tachycardic, normotensive and saturating comfortably on her home 4 L. Labs notable for leukocytosis with WBC 13.6, normal renal function and electrolytes, CO2 19, UA showed moderate leukocytes and positive nitrites with greater than 50 WBC per hpf and many bacteria. CT stone study showed 5 mm stone in the right ureter with severe distal hydronephrosis and moderate hydroureter. She received IV fluids and ceftriaxone 2 g IV while in the ED. Urology was consulted with plans for  operative management of ureteral stones.  **Interim History Urinalysis and blood cultures are growing out E. coli which are only resistant to ampicillin and ampicillin/sulbactam.  Patient continued to be nauseous the day before yesterday and complaining of a headache so we will place on Fioricet as her headache has been persistent.  Yesterday still not feeling as well again and continued to be nauseous but this is improved and she is now only complaining of a backache.  Hospitalization has been complicated by abnormal LFTs which were worked up and trending down.  Patient improved and was deemed stable be discharged and she will need to follow-up with PCP as well as urology in the outpatient setting  Discharge Diagnoses:  Active Problems:   Hydronephrosis, right  Sepsis secondary to E Coli pyelonephritis and Bacteremia E. coli septicemia Obstructing right ureteral stone with right hydronephrosis and hydroureter -Patient presenting with right-sided flank/back pain. WBC count 13.6, lactic acid 1.4. -CT renal notable for a 5 mm stone mid/distal right ureter with severe hydronephrosis and moderate hydroureter. Urinalysis with moderate leukoesterase, positive nitrite, many bacteria, greater than 50 WBCs. Underwent urgent cystoscopy, retrograde pyelogram, and right ureteral stent placement on 05/08/2019 by urology, Dr. Junious Silk. -Blood cultures x2:E. Coli with resistance to Ampicillin and Ampicillin/Sulbactam  -Urine culture:E. coli with resistance to ampicillin, ampicillin/sulbactam -Continude ceftriaxone2g IV every 24 hours but transitioned to po Cefuroxime at D/C for 5 more days   -Pain control with oxycodone 5 mg q4h prn, Toradol 15mg  IVq6h prn  -Continue Tamsulosin 0.4 mg po qHS -Foley catheter removed per urology -Closely monitor urinary output as an outpatient  -Urology plans repeat right ureteroscopy in a few weeks to clear out stone in the right ureter and will ned to  follow up closely  with Dr. Junious Silk -Given 500 mL NS Bolus the day before yesterday  yesterday AM  -C/w Supportive Care and Antiemetics with Promethazine 12.5 mg IV q6hprn Gentle IVF Maintenance at 75 mL/hr for 16 hours now stopped  -Patient continued to be nauseous and complained of a headache and now a backache yesterday but nausea and headache have improved and continues to have Left sided back pain where she has infection -Discharging home this AM as she is improved and switched to p.o. antibiotics -Continued Supportive Care and with Antiemetics and have added IV Zofran while hospitalize; Will D/C with po Zofran   Essential hypertension: -BP this AM was 131/83, well controlled -Continue Amlodipine 10 mg p.o. daily -Continue Metoprolol Succinate 50 mg p.o. daily  HLD -Will hold Atorvastatin 10 mg p.o. nightly given increased LFTs but ok to resume at D/C given improvement in LFTs   History of TIAs -Will hold Atorvastatin 10 mg p.o. nightly given increased LFTs but ok to resume at D/C given improvement in LFTs  GERD -Continue PPI with Pantoprazole 40 mg p.o. daily  OSA on CPAP -Continue home CPAP  Chronic hypoxic respiratory failure COPD -Patient on 4 L nasal cannula at home. -C/w Duoneb 3 mL nebulized every 4 hours as needed for wheezing or shortness of breath along with continuing Dulera 200-5 mcg per actuation 2 puffs inhalations twice daily -C/w Home Inhalers  -Titrate supplemental oxygen to maintain SPO2 greater than 88% -Currently not in exacerbation  Chronic Migraine Headaches/Acute headache, improved -Follows with [facility], Dr.Yan. -Continue Topamax75mg  BID -Has ketorolac 15 mg IV every 6 as needed for severe pain; Given 1x of 30 mg yesterday  -We will start Fioricet 1 tab every 6 hours as needed for headache and this has improved -Continue with oxycodone IR 5 mg every 4 hours as needed for moderate pain at D/C for pain  CKD Stage 3, improved  -Has a  history of CKD stage III -Currently stable as BUN/creatinine went from 30/1.27 is now 10/0.78 Avoid nephrotoxic medications, contrast dyes, hypotension if possible and renally adjust medications -IVF as above now stopped -Continue to monitor and trend renal function -Repeat CMP within 1 week   Macrocyctic Anemia/Anemia of Chronic Kidney Disease -Patient's Hb/Hct went from 11.0/36.7 -> 10.7/35.7 -> 11.3/37.2 -> 11.3/37.0 and MCV this AM was 103.6 -Checked anemia panel and showed an iron level of 16, U IBC of 246, TIBC of 262, saturation ratios of 6%, ferritin of 80, folate 2.1, vitamin B12 level 442 -We will start the patient on Niferex 150 mg p.o. daily and continue at D/C -Continue to monitor for signs and symptoms of bleeding; currently no overt bleeding noted -Repeat CBC in a.m.  Hypokalemia -Patient's potassium this morning was 3.8 -Continue to monitor and trend and replete as necessary -Repeat CMP in a.m.  Obesity -Estimated body mass index is 38.43 kg/m as calculated from the following:   Height as of this encounter: 5\' 2"  (1.575 m).   Weight as of this encounter: 95.3 kg. -Weight Loss and Dietary Counseling given  Abnormal LFT's -Patient's AST was 83 and ALT was 99 yesterday and now improved as AST is 36 and ALT is 77 -Checked RUQ and showed "Fatty liver. Cholecystectomy. Patent main portal vein with hepatopetal flow."  -Acute Hepatitis Panel Non-Reactive  -Continue to Monitor and Trend Hepatic Function Panel -Repeat CMP as an outpatient and Resume Home Atorvastatin now   Nausea -Persistent but now improved  -C/w Phenergan and started the  Patient on IV Zofran as well; Will D/C on po Zofran  -C/w Supportive Care and Antiemetics; -D/C IVF Hydration today -If not improving may need to discuss with Gastroenterology but since she is improved she has been deemed stable to D/C and will need to follow up with PCP within 1 week   Discharge Instructions  Discharge  Instructions    Call MD for:  difficulty breathing, headache or visual disturbances   Complete by: As directed    Call MD for:  extreme fatigue   Complete by: As directed    Call MD for:  hives   Complete by: As directed    Call MD for:  persistant dizziness or light-headedness   Complete by: As directed    Call MD for:  persistant nausea and vomiting   Complete by: As directed    Call MD for:  redness, tenderness, or signs of infection (pain, swelling, redness, odor or green/yellow discharge around incision site)   Complete by: As directed    Call MD for:  severe uncontrolled pain   Complete by: As directed    Call MD for:  temperature >100.4   Complete by: As directed    Diet - low sodium heart healthy   Complete by: As directed    Discharge instructions   Complete by: As directed    You were cared for by a hospitalist during your hospital stay. If you have any questions about your discharge medications or the care you received while you were in the hospital after you are discharged, you can call the unit and ask to speak with the hospitalist on call if the hospitalist that took care of you is not available. Once you are discharged, your primary care physician will handle any further medical issues. Please note that NO REFILLS for any discharge medications will be authorized once you are discharged, as it is imperative that you return to your primary care physician (or establish a relationship with a primary care physician if you do not have one) for your aftercare needs so that they can reassess your need for medications and monitor your lab values.  Follow up with PCP and Urology within 1-2 weeks. Take all medications as prescribed. If symptoms change or worsen please return to the ED for evaluation   Increase activity slowly   Complete by: As directed      Allergies as of 05/13/2019      Reactions   Lisinopril Cough   Lidocaine    Increase blood pressure   Doxycycline Diarrhea    Severe headaches   Metronidazole Other (See Comments)   Severe headaches BUT CAN BE TOLERATED      Medication List    STOP taking these medications   oxyCODONE-acetaminophen 5-325 MG tablet Commonly known as: PERCOCET/ROXICET     TAKE these medications   acetaminophen 325 MG tablet Commonly known as: TYLENOL Take 650 mg by mouth every 6 (six) hours as needed for mild pain or headache.   amLODipine 10 MG tablet Commonly known as: NORVASC Take 10 mg by mouth daily.   aspirin EC 81 MG tablet Take 1 tablet (81 mg total) by mouth 2 (two) times daily.   atorvastatin 20 MG tablet Commonly known as: LIPITOR Take 10 mg by mouth at bedtime.   cefUROXime 500 MG tablet Commonly known as: CEFTIN Take 1 tablet (500 mg total) by mouth 2 (two) times daily with a meal for 5 days.   CeleBREX 200 MG capsule Generic  drug: celecoxib Take 200 mg by mouth 2 (two) times daily.   chlorpheniramine 4 MG tablet Commonly known as: CHLOR-TRIMETON Take 1 tablet (4 mg total) by mouth at bedtime.   clonazePAM 0.5 MG tablet Commonly known as: KLONOPIN Take 0.5 mg by mouth at bedtime. Can take BID   docusate sodium 100 MG capsule Commonly known as: COLACE Take 1 capsule (100 mg total) by mouth 2 (two) times daily.   DULoxetine 30 MG capsule Commonly known as: CYMBALTA Take 30 mg by mouth daily.   gabapentin 300 MG capsule Commonly known as: NEURONTIN Take 300 mg by mouth 3 (three) times daily.   hydrochlorothiazide 25 MG tablet Commonly known as: HYDRODIURIL Take 25 mg by mouth daily as needed (swelling).   iron polysaccharides 150 MG capsule Commonly known as: NIFEREX Take 1 capsule (150 mg total) by mouth daily. Start taking on: May 14, 2019   metoprolol succinate 50 MG 24 hr tablet Commonly known as: TOPROL-XL Take 50 mg by mouth at bedtime.   omeprazole 40 MG capsule Commonly known as: PRILOSEC TAKE 1 CAPSULE(40 MG) BY MOUTH TWICE DAILY What changed: See the new  instructions.   ondansetron 4 MG disintegrating tablet Commonly known as: Zofran ODT Take 1 tablet (4 mg total) by mouth every 8 (eight) hours as needed for nausea or vomiting.   oxyCODONE 5 MG immediate release tablet Commonly known as: Oxy IR/ROXICODONE Take 1 tablet (5 mg total) by mouth every 4 (four) hours as needed for moderate pain.   OXYGEN Inhale 4 L into the lungs at bedtime.   polyethylene glycol 17 g packet Commonly known as: MIRALAX / GLYCOLAX Take 17 g by mouth daily. Start taking on: May 14, 2019   senna 8.6 MG Tabs tablet Commonly known as: SENOKOT Take 1 tablet (8.6 mg total) by mouth 2 (two) times daily.   Symbicort 160-4.5 MCG/ACT inhaler Generic drug: budesonide-formoterol Inhale 2 puffs into the lungs 2 (two) times daily as needed (Shortness of breath).   tamsulosin 0.4 MG Caps capsule Commonly known as: FLOMAX Take 1 capsule (0.4 mg total) by mouth daily after supper.   tiZANidine 2 MG tablet Commonly known as: ZANAFLEX Take 1 tablet (2 mg total) by mouth every 6 (six) hours as needed. What changed:   when to take this  reasons to take this   topiramate 100 MG tablet Commonly known as: TOPAMAX Take 1 tablet (100 mg total) by mouth 2 (two) times daily.   traZODone 50 MG tablet Commonly known as: DESYREL Take 50 mg by mouth at bedtime as needed for sleep.      Follow-up Information    Festus Aloe, MD.   Specialty: Urology Why: Office will call with appointment to plan stone/stent removal with Dr. Junious Silk.  Be sure to schedule follow-up. Contact information: [place] [address] [phone]        Berkley Harvey, NP. Call.   Specialty: Nurse Practitioner Why: Follow up within 1 week Contact information: [address] [place] [address] [phone]          Allergies  Allergen Reactions  . Lisinopril Cough  . Lidocaine     Increase blood pressure  . Doxycycline Diarrhea    Severe  headaches  . Metronidazole Other (See Comments)    Severe headaches BUT CAN BE TOLERATED   Consultations:  Urology  Procedures/Studies: Dg C-arm 1-60 Min-no Report  Result Date: 05/08/2019 Fluoroscopy was utilized by the requesting physician.  No radiographic interpretation.   Ct Renal Stone Study  Result Date: 05/08/2019 CLINICAL DATA:  Right flank pain. Concern for UTI versus kidney stone. Previous history of a kidney stone. EXAM: CT ABDOMEN AND PELVIS WITHOUT CONTRAST TECHNIQUE: Multidetector CT imaging of the abdomen and pelvis was performed following the standard protocol without IV contrast. COMPARISON:  08/06/2017. FINDINGS: Lower chest: Clear lung bases.  Heart normal in size. Hepatobiliary: Decreased attenuation of the liver consistent with fatty infiltration. No liver mass or focal lesion. Status post cholecystectomy. No bile duct dilation. Pancreas: Unremarkable. No pancreatic ductal dilatation or surrounding inflammatory changes. Spleen: Normal in size without focal abnormality. Adrenals/Urinary Tract: No adrenal masses. There is marked right hydronephrosis with right perinephric stranding. Proximal to mid right ureter is moderately dilated. Findings are due to a stone or 2 contiguous stones, spanning a length of 5 mm, in the mid to distal ureter at the level of the pelvic brim. No additional ureteral stones. No left hydronephrosis. Normal left ureter. No renal masses. Bladder is decompressed but otherwise unremarkable. Stomach/Bowel: Small hiatal hernia.  Stomach otherwise unremarkable. Small bowel is normal caliber.  No wall thickening or inflammation. Bowel anastomosis staple line noted along the rectosigmoid junction, stable. Colon is normal in caliber. A small portion of the transverse colon enters a small midline anterior abdominal wall hernia, new from the prior exam. Colon otherwise unremarkable. Normal appendix visualized. Vascular/Lymphatic: Mild aortic atherosclerosis. No  enlarged lymph nodes. Reproductive: Uterus and bilateral adnexa are unremarkable. Other: No ascites. Musculoskeletal: No fracture or acute finding. No osteoblastic or osteolytic lesions. IMPRESSION: 1. 5 mm stone versus 2 directly adjacent stones in the mid to distal right ureter, at the pelvic brim, causes severe right hydronephrosis and moderate hydroureter. 2. No other acute findings within the abdomen or pelvis. 3. Small midline hernia containing a knuckle of the transverse colon, but without evidence of obstruction, incarceration or strangulation. This is new since the prior CT. 4. No intrarenal stones. 5. Hepatic steatosis.  Mild aortic atherosclerosis. Electronically Signed   By: Lajean Manes M.D.   On: 05/08/2019 16:15   US Abdomen Limited Ruq  Result Date: 05/12/2019 CLINICAL DATA:  63 year old female with abnormal LFTs. History of cholecystectomy. EXAM: ULTRASOUND ABDOMEN LIMITED RIGHT UPPER QUADRANT COMPARISON:  CT of the abdomen pelvis dated 05/08/2019 FINDINGS: Gallbladder: Cholecystectomy. Common bile duct: Diameter: 8 mm Liver: Mild diffuse increased liver echogenicity most commonly seen in the setting of fatty infiltration. Superimposed inflammation or fibrosis is not excluded. Clinical correlation is recommended. Portal vein is patent on color Doppler imaging with normal direction of blood flow towards the liver. Other: None. IMPRESSION: 1. Fatty liver. 2. Cholecystectomy. 3. Patent main portal vein with hepatopetal flow. Electronically Signed   By: Anner Crete M.D.   On: 05/12/2019 11:07    Subjective: Seen and examined at bedside she was improved and denied any nausea or headache today but was complaining of some backache again.  Felt well enough to go home and she will need to follow-up with PCP and urology in outpatient setting.  She denies any other complaints or concerns at this time and happy that her kidney function is improved.  Discharge Exam: Vitals:   05/13/19 0443  05/13/19 0820  BP: 131/83   Pulse: 61   Resp: 18   Temp: 98.2 F (36.8 C)   SpO2: 99% 96%   Vitals:   05/12/19 1457 05/12/19 2032 05/13/19 0443 05/13/19 0820  BP: (!) 145/79 132/79 131/83   Pulse: Marland Kitchen)  59 67 61   Resp:  18 18   Temp: 97.8 F (36.6 C) 98.2 F (36.8 C) 98.2 F (36.8 C)   TempSrc: Oral Oral Oral   SpO2: 94% 93% 99% 96%  Weight:      Height:       General: Pt is alert, awake, not in acute distress Cardiovascular: RRR, S1/S2 +, no rubs, no gallops Respiratory: Diminished bilaterally, no wheezing, no rhonchi Abdominal: Soft, NT, distended secondary body habitus, bowel sounds + Extremities: Mild edema, no cyanosis  The results of significant diagnostics from this hospitalization (including imaging, microbiology, ancillary and laboratory) are listed below for reference.    Microbiology: Recent Results (from the past 240 hour(s))  Urine C&S     Status: Abnormal   Collection Time: 05/08/19  1:36 PM   Specimen: Urine, Random  Result Value Ref Range Status   Specimen Description   Final    URINE, RANDOM Performed at [facility] [address], [place], [place] [address]    Special Requests   Final    NONE Performed at [facility], [place] [address], [place], [place] [address]    Culture >=100,000 COLONIES/mL ESCHERICHIA COLI (A)  Final   Report Status 05/10/2019 FINAL  Final   Organism ID, Bacteria ESCHERICHIA COLI (A)  Final      Susceptibility   Escherichia coli - MIC*    AMPICILLIN >=32 RESISTANT Resistant     CEFAZOLIN <=4 SENSITIVE Sensitive     CEFTRIAXONE <=1 SENSITIVE Sensitive     CIPROFLOXACIN <=0.25 SENSITIVE Sensitive     GENTAMICIN <=1 SENSITIVE Sensitive     IMIPENEM <=0.25 SENSITIVE Sensitive     NITROFURANTOIN <=16 SENSITIVE Sensitive     TRIMETH/SULFA <=20 SENSITIVE Sensitive     AMPICILLIN/SULBACTAM >=32 RESISTANT Resistant     PIP/TAZO <=4 SENSITIVE Sensitive     Extended ESBL NEGATIVE Sensitive      * >=100,000 COLONIES/mL ESCHERICHIA COLI  Blood culture (routine x 2)     Status: Abnormal   Collection Time: 05/08/19  5:33 PM   Specimen: BLOOD  Result Value Ref Range Status   Specimen Description   Final    BLOOD LEFT UPPERARM Performed at [facility] [address], [place], [place] [address]    Special Requests   Final    BOTTLES DRAWN AEROBIC AND ANAEROBIC Blood Culture results may not be optimal due to an inadequate volume of blood received in culture bottles Performed at [facility] [address], [place], [place] [address]    Culture  Setup Time   Final    GRAM NEGATIVE RODS IN BOTH AEROBIC AND ANAEROBIC BOTTLES CRITICAL RESULT CALLED TO, READ BACK BY AND VERIFIED WITH: Shelda Jakes PharmD 15:45 05/09/19 ([email or web address]) Performed at [facility], [address], [place], [place] [address]    Culture ESCHERICHIA COLI (A)  Final   Report Status 05/11/2019 FINAL  Final   Organism ID, Bacteria ESCHERICHIA COLI  Final      Susceptibility   Escherichia coli - MIC*    AMPICILLIN >=32 RESISTANT Resistant     CEFAZOLIN <=4 SENSITIVE Sensitive     CEFEPIME <=1 SENSITIVE Sensitive     CEFTAZIDIME <=1 SENSITIVE Sensitive     CEFTRIAXONE <=1 SENSITIVE Sensitive     CIPROFLOXACIN <=0.25 SENSITIVE Sensitive     GENTAMICIN <=1 SENSITIVE Sensitive     IMIPENEM <=0.25 SENSITIVE Sensitive     TRIMETH/SULFA <=20 SENSITIVE Sensitive  AMPICILLIN/SULBACTAM >=32 RESISTANT Resistant     PIP/TAZO <=4 SENSITIVE Sensitive     Extended ESBL NEGATIVE Sensitive     * ESCHERICHIA COLI  Blood Culture ID Panel (Reflexed)     Status: Abnormal   Collection Time: 05/08/19  5:33 PM  Result Value Ref Range Status   Enterococcus species NOT DETECTED NOT DETECTED Final   Listeria monocytogenes NOT DETECTED NOT DETECTED Final   Staphylococcus species NOT DETECTED NOT DETECTED Final   Staphylococcus aureus (BCID) NOT DETECTED NOT DETECTED Final   Streptococcus  species NOT DETECTED NOT DETECTED Final   Streptococcus agalactiae NOT DETECTED NOT DETECTED Final   Streptococcus pneumoniae NOT DETECTED NOT DETECTED Final   Streptococcus pyogenes NOT DETECTED NOT DETECTED Final   Acinetobacter baumannii NOT DETECTED NOT DETECTED Final   Enterobacteriaceae species DETECTED (A) NOT DETECTED Final    Comment: Enterobacteriaceae represent a large family of gram-negative bacteria, not a single organism. CRITICAL RESULT CALLED TO, READ BACK BY AND VERIFIED WITH: Shelda Jakes PharmD 15:45 05/09/19 ([email or web address])    Enterobacter cloacae complex NOT DETECTED NOT DETECTED Final   Escherichia coli DETECTED (A) NOT DETECTED Final    Comment: CRITICAL RESULT CALLED TO, READ BACK BY AND VERIFIED WITH: Shelda Jakes PharmD 15:45 05/09/19 ([email or web address])    Klebsiella oxytoca NOT DETECTED NOT DETECTED Final   Klebsiella pneumoniae NOT DETECTED NOT DETECTED Final   Proteus species NOT DETECTED NOT DETECTED Final   Serratia marcescens NOT DETECTED NOT DETECTED Final   Carbapenem resistance NOT DETECTED NOT DETECTED Final   Haemophilus influenzae NOT DETECTED NOT DETECTED Final   Neisseria meningitidis NOT DETECTED NOT DETECTED Final   Pseudomonas aeruginosa NOT DETECTED NOT DETECTED Final   Candida albicans NOT DETECTED NOT DETECTED Final   Candida glabrata NOT DETECTED NOT DETECTED Final   Candida krusei NOT DETECTED NOT DETECTED Final   Candida parapsilosis NOT DETECTED NOT DETECTED Final   Candida tropicalis NOT DETECTED NOT DETECTED Final    Comment: Performed at [facility], [place] [address] [place], [place] [address]  SARS Coronavirus 2 by RT PCR (hospital order, performed in [facility] hospital lab) Nasopharyngeal Nasopharyngeal Swab     Status: None   Collection Time: 05/08/19  7:14 PM   Specimen: Nasopharyngeal Swab  Result Value Ref Range Status   SARS Coronavirus 2 NEGATIVE NEGATIVE Final    Comment: (NOTE) If result is NEGATIVE SARS-CoV-2 target nucleic acids  are NOT DETECTED. The SARS-CoV-2 RNA is generally detectable in upper and lower  respiratory specimens during the acute phase of infection. The lowest  concentration of SARS-CoV-2 viral copies this assay can detect is 250  copies / mL. A negative result does not preclude SARS-CoV-2 infection  and should not be used as the sole basis for treatment or other  patient management decisions.  A negative result may occur with  improper specimen collection / handling, submission of specimen other  than nasopharyngeal swab, presence of viral mutation(s) within the  areas targeted by this assay, and inadequate number of viral copies  (<250 copies / mL). A negative result must be combined with clinical  observations, patient history, and epidemiological information. If result is POSITIVE SARS-CoV-2 target nucleic acids are DETECTED. The SARS-CoV-2 RNA is generally detectable in upper and lower  respiratory specimens dur ing the acute phase of infection.  Positive  results are indicative of active infection with SARS-CoV-2.  Clinical  correlation with patient history and other diagnostic information is  necessary to determine patient  infection status.  Positive results do  not rule out bacterial infection or co-infection with other viruses. If result is PRESUMPTIVE POSTIVE SARS-CoV-2 nucleic acids MAY BE PRESENT.   A presumptive positive result was obtained on the submitted specimen  and confirmed on repeat testing.  While 2019 novel coronavirus  (SARS-CoV-2) nucleic acids may be present in the submitted sample  additional confirmatory testing may be necessary for epidemiological  and / or clinical management purposes  to differentiate between  SARS-CoV-2 and other Sarbecovirus currently known to infect humans.  If clinically indicated additional testing with an alternate test  methodology [phone]) is advised. The SARS-CoV-2 RNA is generally  detectable in upper and lower respiratory sp ecimens  during the acute  phase of infection. The expected result is Negative. Fact Sheet for Patients:  StrictlyIdeas.no Fact Sheet for Healthcare Providers: BankingDealers.co.za This test is not yet approved or cleared by the [place] FDA and has been authorized for detection and/or diagnosis of SARS-CoV-2 by FDA under an Emergency Use Authorization (EUA).  This EUA will remain in effect (meaning this test can be used) for the duration of the COVID-19 declaration under Section 564(b)(1) of the Act, 21 U.S.C. section 360bbb-3(b)(1), unless the authorization is terminated or revoked sooner. Performed at [facility], [place] [address] [place], [place] [address]   Blood culture (routine x 2)     Status: None (Preliminary result)   Collection Time: 05/08/19 11:33 PM   Specimen: BLOOD  Result Value Ref Range Status   Specimen Description BLOOD RIGHT ANTECUBITAL  Final   Special Requests   Final    BOTTLES DRAWN AEROBIC ONLY Blood Culture adequate volume   Culture   Final    NO GROWTH 3 DAYS Performed at [place] [address], [place], [place] [address]    Report Status PENDING  Incomplete     Labs: BNP (last 3 results) No results for input(s): BNP in the last 8760 hours. Basic Metabolic Panel: Recent Labs  Lab 05/08/19 1443 05/08/19 2333 05/10/19 0711 05/11/19 0338 05/12/19 0506 05/13/19 0501  NA 137  --  139 142 143 142  K 3.7  --  3.7 3.4* 4.2 3.8  CL 108  --  108 106 109 107  CO2 19*  --  23 26 25 26   GLUCOSE 130*  --  102* 125* 101* 90  BUN 19  --  30* 23* 12 10  CREATININE 1.11*  --  1.27* 1.16* 0.87 0.78  CALCIUM 8.7*  --  8.5* 8.3* 8.6* 8.5*  MG  --  1.7  --  1.9 1.8 1.7  PHOS  --  3.2  --  4.6 3.6 3.9   Liver Function Tests: Recent Labs  Lab 05/12/19 0506 05/13/19 0501  AST 83* 36  ALT 99* 77*  ALKPHOS 105 109  BILITOT 0.5 0.7  PROT 6.5 6.6  ALBUMIN 2.8* 2.8*   No results for  input(s): LIPASE, AMYLASE in the last 168 hours. No results for input(s): AMMONIA in the last 168 hours. CBC: Recent Labs  Lab 05/08/19 1443 05/10/19 0711 05/11/19 0338 05/12/19 0506 05/13/19 0501  WBC 13.6* 8.5 5.3 4.7 5.6  NEUTROABS  --   --   --  2.3 2.9  HGB 13.2 11.0* 10.7* 11.3* 11.3*  HCT 41.8 36.7 35.7* 37.2 37.0  MCV 101.7* 105.8* 106.6* 104.2* 103.6*  PLT 268 256 274 289 299   Cardiac Enzymes: No results for input(s): CKTOTAL, CKMB, CKMBINDEX, TROPONINI in  the last 168 hours. BNP: Invalid input(s): POCBNP CBG: Recent Labs  Lab 05/09/19 0802 05/10/19 0806 05/11/19 0743 05/12/19 0757 05/13/19 0751  GLUCAP 117* 80 102* 89 84   D-Dimer No results for input(s): DDIMER in the last 72 hours. Hgb A1c No results for input(s): HGBA1C in the last 72 hours. Lipid Profile No results for input(s): CHOL, HDL, LDLCALC, TRIG, CHOLHDL, LDLDIRECT in the last 72 hours. Thyroid function studies No results for input(s): TSH, T4TOTAL, T3FREE, THYROIDAB in the last 72 hours.  Invalid input(s): FREET3 Anemia work up Recent Labs    05/11/19 0338 05/11/19 0940  VITAMINB12  --  442  FOLATE  --  2.1*  FERRITIN  --  80  TIBC  --  262  IRON  --  16*  RETICCTPCT 0.6  --    Urinalysis    Component Value Date/Time   COLORURINE AMBER (A) 05/08/2019 1335   APPEARANCEUR CLOUDY (A) 05/08/2019 1335   LABSPEC 1.018 05/08/2019 1335   PHURINE 6.0 05/08/2019 1335   GLUCOSEU NEGATIVE 05/08/2019 1335   HGBUR SMALL (A) 05/08/2019 1335   BILIRUBINUR NEGATIVE 05/08/2019 1335   KETONESUR NEGATIVE 05/08/2019 1335   PROTEINUR 100 (A) 05/08/2019 1335   UROBILINOGEN 0.2 02/09/2015 1322   NITRITE POSITIVE (A) 05/08/2019 1335   LEUKOCYTESUR MODERATE (A) 05/08/2019 1335   Sepsis Labs Invalid input(s): PROCALCITONIN,  WBC,  LACTICIDVEN Microbiology Recent Results (from the past 240 hour(s))  Urine C&S     Status: Abnormal   Collection Time: 05/08/19  1:36 PM   Specimen: Urine, Random   Result Value Ref Range Status   Specimen Description   Final    URINE, RANDOM Performed at [facility], [place] [address], [place], [place] [address]    Special Requests   Final    NONE Performed at [facility], [place] [address], [place], [place] [address]    Culture >=100,000 COLONIES/mL ESCHERICHIA COLI (A)  Final   Report Status 05/10/2019 FINAL  Final   Organism ID, Bacteria ESCHERICHIA COLI (A)  Final      Susceptibility   Escherichia coli - MIC*    AMPICILLIN >=32 RESISTANT Resistant     CEFAZOLIN <=4 SENSITIVE Sensitive     CEFTRIAXONE <=1 SENSITIVE Sensitive     CIPROFLOXACIN <=0.25 SENSITIVE Sensitive     GENTAMICIN <=1 SENSITIVE Sensitive     IMIPENEM <=0.25 SENSITIVE Sensitive     NITROFURANTOIN <=16 SENSITIVE Sensitive     TRIMETH/SULFA <=20 SENSITIVE Sensitive     AMPICILLIN/SULBACTAM >=32 RESISTANT Resistant     PIP/TAZO <=4 SENSITIVE Sensitive     Extended ESBL NEGATIVE Sensitive     * >=100,000 COLONIES/mL ESCHERICHIA COLI  Blood culture (routine x 2)     Status: Abnormal   Collection Time: 05/08/19  5:33 PM   Specimen: BLOOD  Result Value Ref Range Status   Specimen Description   Final    BLOOD LEFT UPPERARM Performed at [place] [address], [place], [place] [address]    Special Requests   Final    BOTTLES DRAWN AEROBIC AND ANAEROBIC Blood Culture results may not be optimal due to an inadequate volume of blood received in culture bottles Performed at [place] [address], [place], [place] [address]    Culture  Setup Time   Final    GRAM NEGATIVE RODS IN BOTH AEROBIC AND ANAEROBIC BOTTLES CRITICAL RESULT CALLED TO, READ BACK BY AND VERIFIED WITH: Shelda Jakes PharmD  15:45 05/09/19 ([email or web address]) Performed at [facility], [place] [address], [place], [place] [address]    Culture ESCHERICHIA COLI (A)  Final   Report Status 05/11/2019 FINAL  Final   Organism ID, Bacteria  ESCHERICHIA COLI  Final      Susceptibility   Escherichia coli - MIC*    AMPICILLIN >=32 RESISTANT Resistant     CEFAZOLIN <=4 SENSITIVE Sensitive     CEFEPIME <=1 SENSITIVE Sensitive     CEFTAZIDIME <=1 SENSITIVE Sensitive     CEFTRIAXONE <=1 SENSITIVE Sensitive     CIPROFLOXACIN <=0.25 SENSITIVE Sensitive     GENTAMICIN <=1 SENSITIVE Sensitive     IMIPENEM <=0.25 SENSITIVE Sensitive     TRIMETH/SULFA <=20 SENSITIVE Sensitive     AMPICILLIN/SULBACTAM >=32 RESISTANT Resistant     PIP/TAZO <=4 SENSITIVE Sensitive     Extended ESBL NEGATIVE Sensitive     * ESCHERICHIA COLI  Blood Culture ID Panel (Reflexed)     Status: Abnormal   Collection Time: 05/08/19  5:33 PM  Result Value Ref Range Status   Enterococcus species NOT DETECTED NOT DETECTED Final   Listeria monocytogenes NOT DETECTED NOT DETECTED Final   Staphylococcus species NOT DETECTED NOT DETECTED Final   Staphylococcus aureus (BCID) NOT DETECTED NOT DETECTED Final   Streptococcus species NOT DETECTED NOT DETECTED Final   Streptococcus agalactiae NOT DETECTED NOT DETECTED Final   Streptococcus pneumoniae NOT DETECTED NOT DETECTED Final   Streptococcus pyogenes NOT DETECTED NOT DETECTED Final   Acinetobacter baumannii NOT DETECTED NOT DETECTED Final   Enterobacteriaceae species DETECTED (A) NOT DETECTED Final    Comment: Enterobacteriaceae represent a large family of gram-negative bacteria, not a single organism. CRITICAL RESULT CALLED TO, READ BACK BY AND VERIFIED WITH: Shelda Jakes PharmD 15:45 05/09/19 ([email or web address])    Enterobacter cloacae complex NOT DETECTED NOT DETECTED Final   Escherichia coli DETECTED (A) NOT DETECTED Final    Comment: CRITICAL RESULT CALLED TO, READ BACK BY AND VERIFIED WITH: Shelda Jakes PharmD 15:45 05/09/19 ([email or web address])    Klebsiella oxytoca NOT DETECTED NOT DETECTED Final   Klebsiella pneumoniae NOT DETECTED NOT DETECTED Final   Proteus species NOT DETECTED NOT DETECTED Final   Serratia marcescens NOT  DETECTED NOT DETECTED Final   Carbapenem resistance NOT DETECTED NOT DETECTED Final   Haemophilus influenzae NOT DETECTED NOT DETECTED Final   Neisseria meningitidis NOT DETECTED NOT DETECTED Final   Pseudomonas aeruginosa NOT DETECTED NOT DETECTED Final   Candida albicans NOT DETECTED NOT DETECTED Final   Candida glabrata NOT DETECTED NOT DETECTED Final   Candida krusei NOT DETECTED NOT DETECTED Final   Candida parapsilosis NOT DETECTED NOT DETECTED Final   Candida tropicalis NOT DETECTED NOT DETECTED Final    Comment: Performed at [facility], [place] [address] [place], [place] [address]  SARS Coronavirus 2 by RT PCR (hospital order, performed in [facility] hospital lab) Nasopharyngeal Nasopharyngeal Swab     Status: None   Collection Time: 05/08/19  7:14 PM   Specimen: Nasopharyngeal Swab  Result Value Ref Range Status   SARS Coronavirus 2 NEGATIVE NEGATIVE Final    Comment: (NOTE) If result is NEGATIVE SARS-CoV-2 target nucleic acids are NOT DETECTED. The SARS-CoV-2 RNA is generally detectable in upper and lower  respiratory specimens during the acute phase of infection. The lowest  concentration of SARS-CoV-2 viral copies this assay can detect is 250  copies / mL. A negative result does not preclude SARS-CoV-2 infection  and should not be used  as the sole basis for treatment or other  patient management decisions.  A negative result may occur with  improper specimen collection / handling, submission of specimen other  than nasopharyngeal swab, presence of viral mutation(s) within the  areas targeted by this assay, and inadequate number of viral copies  (<250 copies / mL). A negative result must be combined with clinical  observations, patient history, and epidemiological information. If result is POSITIVE SARS-CoV-2 target nucleic acids are DETECTED. The SARS-CoV-2 RNA is generally detectable in upper and lower  respiratory specimens dur ing the acute phase of infection.   Positive  results are indicative of active infection with SARS-CoV-2.  Clinical  correlation with patient history and other diagnostic information is  necessary to determine patient infection status.  Positive results do  not rule out bacterial infection or co-infection with other viruses. If result is PRESUMPTIVE POSTIVE SARS-CoV-2 nucleic acids MAY BE PRESENT.   A presumptive positive result was obtained on the submitted specimen  and confirmed on repeat testing.  While 2019 novel coronavirus  (SARS-CoV-2) nucleic acids may be present in the submitted sample  additional confirmatory testing may be necessary for epidemiological  and / or clinical management purposes  to differentiate between  SARS-CoV-2 and other Sarbecovirus currently known to infect humans.  If clinically indicated additional testing with an alternate test  methodology [phone]) is advised. The SARS-CoV-2 RNA is generally  detectable in upper and lower respiratory sp ecimens during the acute  phase of infection. The expected result is Negative. Fact Sheet for Patients:  StrictlyIdeas.no Fact Sheet for Healthcare Providers: BankingDealers.co.za This test is not yet approved or cleared by the [place] FDA and has been authorized for detection and/or diagnosis of SARS-CoV-2 by FDA under an Emergency Use Authorization (EUA).  This EUA will remain in effect (meaning this test can be used) for the duration of the COVID-19 declaration under Section 564(b)(1) of the Act, 21 U.S.C. section 360bbb-3(b)(1), unless the authorization is terminated or revoked sooner. Performed at [facility], [place] [address] [place], [place] [address]   Blood culture (routine x 2)     Status: None (Preliminary result)   Collection Time: 05/08/19 11:33 PM   Specimen: BLOOD  Result Value Ref Range Status   Specimen Description BLOOD RIGHT ANTECUBITAL  Final   Special  Requests   Final    BOTTLES DRAWN AEROBIC ONLY Blood Culture adequate volume   Culture   Final    NO GROWTH 3 DAYS Performed at [place] [address], [place], [place] [address]    Report Status PENDING  Incomplete   Time coordinating discharge: 35 minutes  SIGNED:  Kerney Elbe, DO [facility] 05/13/2019, 11:52 AM Pager is on [place]  If 7PM-7AM, please contact night-coverage [email or web address] Password [email or web address]

## 2019-05-13 NOTE — Progress Notes (Signed)
AVS given to patient and explained at the bedside. Medications and follow up appointments have been explained with pt verbalizing understanding.  

## 2019-05-14 LAB — CULTURE, BLOOD (ROUTINE X 2)
Culture: NO GROWTH
Special Requests: ADEQUATE

## 2019-05-15 ENCOUNTER — Other Ambulatory Visit: Payer: Self-pay | Admitting: Urology

## 2019-05-15 MED ORDER — MIRABEGRON ER 25 MG PO TB24: 25.0000 mg | ORAL_TABLET | Freq: Every day | ORAL | Status: AC

## 2019-05-15 NOTE — Progress Notes (Signed)
Patient called with urinary urgency and frequency. Small volume voids. She is concerned that the sensation to void is more intense than with prior stents. She is also concerned if she is hydrated given small voided volumes but does reports excellent water intake. No fevers. No dysuria.   She will continue Flomax, will start heat, would like to trial Myrbetriq which had worked in the past with her stent.   Return precautions discussed for urinary retention, oliguria/anuria, fevers.

## 2019-05-18 ENCOUNTER — Other Ambulatory Visit: Payer: Self-pay | Admitting: Urology

## 2019-05-18 ENCOUNTER — Encounter (HOSPITAL_BASED_OUTPATIENT_CLINIC_OR_DEPARTMENT_OTHER): Payer: Self-pay | Admitting: *Deleted

## 2019-05-19 ENCOUNTER — Other Ambulatory Visit (HOSPITAL_COMMUNITY)
Admission: RE | Admit: 2019-05-19 | Discharge: 2019-05-19 | Disposition: A | Discharge status: Home / Self Care | Payer: Medicaid Other | Source: Ambulatory Visit | Attending: Urology | Admitting: Urology

## 2019-05-19 ENCOUNTER — Other Ambulatory Visit: Payer: Self-pay

## 2019-05-19 ENCOUNTER — Encounter (HOSPITAL_BASED_OUTPATIENT_CLINIC_OR_DEPARTMENT_OTHER): Payer: Self-pay | Admitting: *Deleted

## 2019-05-19 DIAGNOSIS — Z01812 Encounter for preprocedural laboratory examination: Secondary | ICD-10-CM | POA: Diagnosis not present

## 2019-05-19 DIAGNOSIS — Z20828 Contact with and (suspected) exposure to other viral communicable diseases: Secondary | ICD-10-CM | POA: Insufficient documentation

## 2019-05-19 NOTE — Progress Notes (Addendum)
Reviewed pt chart for pre-op for surgery on 05-23-2019 (added on 05-18-2019). Spoke w/ Pt has chronic hypoxic respiratory failure w/ OSA uses cpap with 4L O2 nightly and during the day of she lays down, spoke w/ pt and verified this.  Note pt has anesthesia complication with prolonged emergence and low O2 sat's. Even with her cpap O2 sat's are low.  Pt sister died from general anesthesia due to undiagnosed osa.  Pt previously had surgery at [facility] 2014 with anesthesia post op complication, at that time anesthesia made decision pt should also be done at [facility].  Per pt her previous urologist , dr Gaynelle Arabian, was aware and always scheduled her at the main OR.  Reviewed chart w/ anesthesia, Konrad Felix PA, stated for pt safety she would need to be in setting of Main OR.  Called and spoke w/ Coni, or scheduler for dr eskridge, informed of this.  And spoke w/ pt via phone she is aware .

## 2019-05-19 NOTE — Progress Notes (Signed)
PCP - P. Ronnald Ramp NP Cardiologist - Dr. Edmonia James last office visit 07/18/2014  Chest x-ray - 03/22/2019 in epic EKG - 08/08/2018 in epic Stress Test - N/A ECHO - greater than 2 years Cardiac Cath - N/A  Sleep Study - Yes CPAP - Yes  Fasting Blood Sugar - N/A Checks Blood Sugar __N/A___ times a day  Blood Thinner Instructions: N/A Aspirin Instructions: N/A  Last Dose:  N/A  Anesthesia review: N/A  Patient denies shortness of breath, fever, cough and chest pain at PAT appointment   Patient verbalized understanding of instructions that were given to them at the PAT appointment. Patient was also instructed that they will need to review over the PAT instructions again at home before surgery.

## 2019-05-20 LAB — NOVEL CORONAVIRUS, NAA (HOSP ORDER, SEND-OUT TO REF LAB; TAT 18-24 HRS): SARS-CoV-2, NAA: NOT DETECTED

## 2019-05-22 MED ORDER — TRANEXAMIC ACID 1000 MG/10ML IV SOLN
2000.0000 mg | INTRAVENOUS | Status: DC
  Filled 2019-05-22: qty 20

## 2019-05-23 ENCOUNTER — Ambulatory Visit (HOSPITAL_COMMUNITY): Payer: Medicaid Other | Admitting: Physician Assistant

## 2019-05-23 ENCOUNTER — Ambulatory Visit (HOSPITAL_COMMUNITY): Payer: Medicaid Other

## 2019-05-23 ENCOUNTER — Encounter (HOSPITAL_COMMUNITY): Payer: Self-pay | Admitting: General Practice

## 2019-05-23 ENCOUNTER — Encounter (HOSPITAL_COMMUNITY)
Admission: RE | Disposition: A | Discharge status: Home / Self Care | Payer: Self-pay | Source: Home / Self Care | Attending: Urology

## 2019-05-23 ENCOUNTER — Ambulatory Visit (HOSPITAL_COMMUNITY)
Admission: RE | Admit: 2019-05-23 | Discharge: 2019-05-23 | Disposition: A | Discharge status: Home / Self Care | Payer: Medicaid Other | Attending: Urology | Admitting: Urology

## 2019-05-23 DIAGNOSIS — Z791 Long term (current) use of non-steroidal anti-inflammatories (NSAID): Secondary | ICD-10-CM | POA: Insufficient documentation

## 2019-05-23 DIAGNOSIS — Z96652 Presence of left artificial knee joint: Secondary | ICD-10-CM | POA: Insufficient documentation

## 2019-05-23 DIAGNOSIS — N132 Hydronephrosis with renal and ureteral calculous obstruction: Secondary | ICD-10-CM | POA: Diagnosis not present

## 2019-05-23 DIAGNOSIS — Z9981 Dependence on supplemental oxygen: Secondary | ICD-10-CM | POA: Insufficient documentation

## 2019-05-23 DIAGNOSIS — F419 Anxiety disorder, unspecified: Secondary | ICD-10-CM | POA: Insufficient documentation

## 2019-05-23 DIAGNOSIS — G629 Polyneuropathy, unspecified: Secondary | ICD-10-CM | POA: Diagnosis not present

## 2019-05-23 DIAGNOSIS — G4733 Obstructive sleep apnea (adult) (pediatric): Secondary | ICD-10-CM | POA: Diagnosis not present

## 2019-05-23 DIAGNOSIS — K219 Gastro-esophageal reflux disease without esophagitis: Secondary | ICD-10-CM | POA: Diagnosis not present

## 2019-05-23 DIAGNOSIS — I69354 Hemiplegia and hemiparesis following cerebral infarction affecting left non-dominant side: Secondary | ICD-10-CM | POA: Insufficient documentation

## 2019-05-23 DIAGNOSIS — E785 Hyperlipidemia, unspecified: Secondary | ICD-10-CM | POA: Diagnosis not present

## 2019-05-23 DIAGNOSIS — Z79899 Other long term (current) drug therapy: Secondary | ICD-10-CM | POA: Insufficient documentation

## 2019-05-23 DIAGNOSIS — I129 Hypertensive chronic kidney disease with stage 1 through stage 4 chronic kidney disease, or unspecified chronic kidney disease: Secondary | ICD-10-CM | POA: Insufficient documentation

## 2019-05-23 DIAGNOSIS — N183 Chronic kidney disease, stage 3 unspecified: Secondary | ICD-10-CM | POA: Insufficient documentation

## 2019-05-23 DIAGNOSIS — Z8614 Personal history of Methicillin resistant Staphylococcus aureus infection: Secondary | ICD-10-CM | POA: Diagnosis not present

## 2019-05-23 HISTORY — DX: Heart failure, unspecified: I50.9

## 2019-05-23 HISTORY — DX: Unilateral inguinal hernia, without obstruction or gangrene, not specified as recurrent: K40.90

## 2019-05-23 HISTORY — PX: CYSTOSCOPY/URETEROSCOPY/HOLMIUM LASER/STENT PLACEMENT: SHX6546

## 2019-05-23 HISTORY — DX: Unspecified malignant neoplasm of skin, unspecified: C44.90

## 2019-05-23 HISTORY — DX: Benign neoplasm, unspecified site: D36.9

## 2019-05-23 HISTORY — DX: Chronic kidney disease, stage 2 (mild): N18.2

## 2019-05-23 SURGERY — CYSTOSCOPY/URETEROSCOPY/HOLMIUM LASER/STENT PLACEMENT
Anesthesia: General | Laterality: Right

## 2019-05-23 MED ORDER — 0.9 % SODIUM CHLORIDE (POUR BTL) OPTIME
TOPICAL | Status: DC | PRN
  Administered 2019-05-23: 1000 mL

## 2019-05-23 MED ORDER — CEFAZOLIN SODIUM-DEXTROSE 2-4 GM/100ML-% IV SOLN
2.0000 g | Freq: Once | INTRAVENOUS | Status: AC
  Administered 2019-05-23: 2 g via INTRAVENOUS
  Filled 2019-05-23: qty 100

## 2019-05-23 MED ORDER — FENTANYL CITRATE (PF) 100 MCG/2ML IJ SOLN
INTRAMUSCULAR | Status: AC
  Administered 2019-05-23: 50 ug via INTRAVENOUS
  Filled 2019-05-23: qty 2

## 2019-05-23 MED ORDER — ONDANSETRON HCL 4 MG/2ML IJ SOLN
INTRAMUSCULAR | Status: DC | PRN
  Administered 2019-05-23: 4 mg via INTRAVENOUS

## 2019-05-23 MED ORDER — DEXAMETHASONE SODIUM PHOSPHATE 10 MG/ML IJ SOLN
INTRAMUSCULAR | Status: DC | PRN
  Administered 2019-05-23: 10 mg via INTRAVENOUS

## 2019-05-23 MED ORDER — HYDROMORPHONE HCL 1 MG/ML IJ SOLN: 0.2500 mg | INTRAMUSCULAR | Status: DC | PRN

## 2019-05-23 MED ORDER — DEXAMETHASONE SODIUM PHOSPHATE 10 MG/ML IJ SOLN
INTRAMUSCULAR | Status: AC
  Filled 2019-05-23: qty 1

## 2019-05-23 MED ORDER — FENTANYL CITRATE (PF) 100 MCG/2ML IJ SOLN
25.0000 ug | INTRAMUSCULAR | Status: DC | PRN
  Administered 2019-05-23: 17:00:00 50 ug via INTRAVENOUS

## 2019-05-23 MED ORDER — PROMETHAZINE HCL 25 MG/ML IJ SOLN: 6.2500 mg | INTRAMUSCULAR | Status: DC | PRN

## 2019-05-23 MED ORDER — MIDAZOLAM HCL 2 MG/2ML IJ SOLN
INTRAMUSCULAR | Status: AC
  Filled 2019-05-23: qty 2

## 2019-05-23 MED ORDER — LIDOCAINE HCL (CARDIAC) PF 100 MG/5ML IV SOSY
PREFILLED_SYRINGE | INTRAVENOUS | Status: DC | PRN
  Administered 2019-05-23: 80 mg via INTRAVENOUS

## 2019-05-23 MED ORDER — LACTATED RINGERS IV SOLN
INTRAVENOUS | Status: DC
  Administered 2019-05-23: 15:00:00 via INTRAVENOUS

## 2019-05-23 MED ORDER — KETOROLAC TROMETHAMINE 30 MG/ML IJ SOLN
INTRAMUSCULAR | Status: AC
  Administered 2019-05-23: 30 mg via INTRAVENOUS
  Filled 2019-05-23: qty 1

## 2019-05-23 MED ORDER — SODIUM CHLORIDE 0.9 % IR SOLN
Status: DC | PRN
  Administered 2019-05-23: 3000 mL

## 2019-05-23 MED ORDER — KETOROLAC TROMETHAMINE 30 MG/ML IJ SOLN
30.0000 mg | Freq: Once | INTRAMUSCULAR | Status: AC | PRN
  Administered 2019-05-23: 17:00:00 30 mg via INTRAVENOUS

## 2019-05-23 MED ORDER — SUCCINYLCHOLINE CHLORIDE 200 MG/10ML IV SOSY
PREFILLED_SYRINGE | INTRAVENOUS | Status: AC
  Filled 2019-05-23: qty 10

## 2019-05-23 MED ORDER — ONDANSETRON HCL 4 MG/2ML IJ SOLN
INTRAMUSCULAR | Status: AC
  Filled 2019-05-23: qty 2

## 2019-05-23 MED ORDER — PROPOFOL 10 MG/ML IV BOLUS
INTRAVENOUS | Status: AC
  Filled 2019-05-23: qty 20

## 2019-05-23 MED ORDER — FENTANYL CITRATE (PF) 100 MCG/2ML IJ SOLN
INTRAMUSCULAR | Status: AC
  Filled 2019-05-23: qty 2

## 2019-05-23 MED ORDER — PROPOFOL 10 MG/ML IV BOLUS
INTRAVENOUS | Status: DC | PRN
  Administered 2019-05-23: 150 mg via INTRAVENOUS

## 2019-05-23 MED ORDER — SUCCINYLCHOLINE CHLORIDE 20 MG/ML IJ SOLN
INTRAMUSCULAR | Status: DC | PRN
  Administered 2019-05-23: 120 mg via INTRAVENOUS

## 2019-05-23 MED ORDER — OXYCODONE HCL 5 MG PO TABS
5.0000 mg | ORAL_TABLET | Freq: Once | ORAL | Status: AC | PRN
  Administered 2019-05-23: 17:00:00 5 mg via ORAL

## 2019-05-23 MED ORDER — FENTANYL CITRATE (PF) 100 MCG/2ML IJ SOLN
INTRAMUSCULAR | Status: DC | PRN
  Administered 2019-05-23: 50 ug via INTRAVENOUS

## 2019-05-23 MED ORDER — OXYCODONE HCL 5 MG PO TABS
ORAL_TABLET | ORAL | Status: AC
  Administered 2019-05-23: 5 mg via ORAL
  Filled 2019-05-23: qty 1

## 2019-05-23 SURGICAL SUPPLY — 22 items
BAG URO CATCHER STRL LF (MISCELLANEOUS) ×3 IMPLANT
BASKET ZERO TIP NITINOL 2.4FR (BASKET) ×3 IMPLANT
CATH INTERMIT  6FR 70CM (CATHETERS) ×3 IMPLANT
CATH URET 5FR 28IN CONE TIP (BALLOONS)
CATH URET 5FR 70CM CONE TIP (BALLOONS) IMPLANT
CLOTH BEACON ORANGE TIMEOUT ST (SAFETY) ×3 IMPLANT
GLOVE BIO SURGEON STRL SZ7.5 (GLOVE) ×3 IMPLANT
GOWN STRL REUS W/TWL LRG LVL3 (GOWN DISPOSABLE) ×3 IMPLANT
GOWN STRL REUS W/TWL XL LVL3 (GOWN DISPOSABLE) ×6 IMPLANT
GUIDEWIRE ANG ZIPWIRE 038X150 (WIRE) ×3 IMPLANT
GUIDEWIRE STR DUAL SENSOR (WIRE) ×3 IMPLANT
GUIDEWIRE ZIPWRE .038 STRAIGHT (WIRE) ×3 IMPLANT
KIT TURNOVER KIT A (KITS) IMPLANT
MANIFOLD NEPTUNE II (INSTRUMENTS) ×3 IMPLANT
PACK CYSTO (CUSTOM PROCEDURE TRAY) ×3 IMPLANT
SHEATH URETERAL 12FRX28CM (UROLOGICAL SUPPLIES) IMPLANT
SHEATH URETERAL 12FRX35CM (MISCELLANEOUS) ×3 IMPLANT
STENT URET 6FRX24 CONTOUR (STENTS) ×3 IMPLANT
TUBING CONNECTING 10 (TUBING) ×2 IMPLANT
TUBING CONNECTING 10' (TUBING) ×1
TUBING UROLOGY SET (TUBING) ×3 IMPLANT
WIRE COONS/BENSON .038X145CM (WIRE) IMPLANT

## 2019-05-23 NOTE — OR Nursing (Signed)
Stone taken by Dr. Eskridge ?

## 2019-05-23 NOTE — Anesthesia Procedure Notes (Signed)
Procedure Name: Intubation Date/Time: 05/23/2019 3:55 PM Performed by: Glory Buff, CRNA Pre-anesthesia Checklist: Patient identified, Emergency Drugs available, Suction available and Patient being monitored Patient Re-evaluated:Patient Re-evaluated prior to induction Oxygen Delivery Method: Circle system utilized Preoxygenation: Pre-oxygenation with 100% oxygen Induction Type: IV induction Ventilation: Mask ventilation without difficulty Laryngoscope Size: Miller and 3 Grade View: Grade I Tube type: Oral Tube size: 7.0 mm Number of attempts: 1 Airway Equipment and Method: Stylet and Oral airway Placement Confirmation: ETT inserted through vocal cords under direct vision,  positive ETCO2 and breath sounds checked- equal and bilateral Secured at: 20 cm Tube secured with: Tape Dental Injury: Teeth and Oropharynx as per pre-operative assessment

## 2019-05-23 NOTE — Anesthesia Preprocedure Evaluation (Addendum)
Anesthesia Evaluation    Airway Mallampati: II  TM Distance: >3 FB Neck ROM: Full    Dental no notable dental hx.    Pulmonary sleep apnea and Oxygen sleep apnea ,    Pulmonary exam normal breath sounds clear to auscultation       Cardiovascular hypertension, Normal cardiovascular exam Rhythm:Regular Rate:Normal     Neuro/Psych CVA    GI/Hepatic GERD  ,  Endo/Other    Renal/GU Renal InsufficiencyRenal disease     Musculoskeletal   Abdominal   Peds  Hematology   Anesthesia Other Findings   Reproductive/Obstetrics                             Anesthesia Physical Anesthesia Plan  ASA: III  Anesthesia Plan: General   Post-op Pain Management:    Induction: Intravenous  PONV Risk Score and Plan: 3 and Ondansetron, Dexamethasone and Treatment may vary due to age or medical condition  Airway Management Planned: Oral ETT  Additional Equipment:   Intra-op Plan:   Post-operative Plan: Extubation in OR  Informed Consent: I have reviewed the patients History and Physical, chart, labs and discussed the procedure including the risks, benefits and alternatives for the proposed anesthesia with the patient or authorized representative who has indicated his/her understanding and acceptance.     Dental advisory given  Plan Discussed with: CRNA and Surgeon  Anesthesia Plan Comments:         Anesthesia Quick Evaluation

## 2019-05-23 NOTE — Op Note (Signed)
Pre-operative diagnosis: right ureteral stone  Post-operative diagnosis: same   Procedure: Cystoscopy, right ureteroscopy, stone basket extraction, right ureteral stent exchange  Surgeon: Junious Silk AsstFredderick Phenix, MD  Anesthesia: General   Indication for procedure: 63 yo female urgently stent about two weeks ago for right proxiamal ureteral stones and UTI/sepsis. She was brought today for definitive ureteroscopy.   Findings: 2 small stones found in the right mid ureter  Description of procedure: After consent was obtained patient brought to the operating room.  After adequate anesthesia she was placed in lithotomy position and prepped and draped in the usual sterile fashion.  A timeout was performed to confirm the patient and procedure.  The cystoscope was passed per urethra and the bladder inspected.  The right stent was grasped and removed through the urethral meatus and a sensor wire was advanced and coiled in the collecting system.  A semirigid scope was advanced adjacent to the wire but I could not quite make the turn up over the iliacs so a Glidewire was advanced under direct vision.  An access sheath was then advanced without difficulty.  The access sheath went a little bit further than I had inspected with the ureteroscope.  The proximal ureter and collecting system was carefully inspected with the digital ureteroscope and no stones were noted.  The access sheath was backed out on the ureteroscope partially and the ureter inspected on the way out and the 2 stones were noted to be in the mid ureter, the access sheath just passed proximal to them.  They were grasped with a 0 tip basket and removed sequentially without difficulty.  The ureter and the collecting system was inspected again and again noted to be without any stones or injury.  The access sheath was backed out on the ureteroscope and the ureter inspected on the way out and noted to be normal without stone fragment or injury.  The  Glidewire was backloaded on the cystoscope and a 624 cm stent advanced.  The wire was removed with a good coil seen in the kidney and a good coil in the bladder.  Complications: None  EBL: Minimal  Specimens: Stones to office lab  Drains: 6 x 24 cm right ureteral stent with string  Disposition: Patient stable to PACU-I discussed with her daughter the procedure and postop care.  I was present and scrubbed for the entire procedure.

## 2019-05-23 NOTE — Interval H&P Note (Signed)
History and Physical Interval Note:  05/23/2019 3:16 PM  Ann [name]  has presented today for surgery, with the diagnosis of RIGHT URETERAL STONE.  The various methods of treatment have been discussed with the patient and family. After consideration of risks, benefits and other options for treatment, the patient has consented to  Procedure(s) with comments: CYSTOSCOPY/URETEROSCOPY/HOLMIUM LASER/STENT EXCHANGE (Right) - ONLY NEEDS 60 MIN as a surgical intervention.  The patient's history has been reviewed, patient examined, no change in status, stable for surgery.  I have reviewed the patient's chart and labs. She continues on cephalexin and has been well with no fever. Stent pain has settled. Discussed the stone(s) may have broken up with stent placement and may be gone. Also we may not get enough for compositional analysis. Discussed new stent and string.  Questions were answered to the patient's satisfaction. She elects to proceed.      Festus Aloe

## 2019-05-23 NOTE — Transfer of Care (Signed)
Immediate Anesthesia Transfer of Care Note  Patient: Ann [name]  Procedure(s) Performed: CYSTOSCOPY/URETEROSCOPY/STENT EXCHANGE (Right )  Patient Location: PACU  Anesthesia Type:General  Level of Consciousness: drowsy, patient cooperative and responds to stimulation  Airway & Oxygen Therapy: Patient Spontanous Breathing and Patient connected to face mask oxygen  Post-op Assessment: Report given to RN and Post -op Vital signs reviewed and stable  Post vital signs: Reviewed and stable  Last Vitals:  Vitals Value Taken Time  BP 171/85 05/23/19 1634  Temp    Pulse 63 05/23/19 1636  Resp 22 05/23/19 1636  SpO2 100 % 05/23/19 1636  Vitals shown include unvalidated device data.  Last Pain:  Vitals:   05/23/19 1415  TempSrc:   PainSc: 7          Complications: No apparent anesthesia complications

## 2019-05-23 NOTE — Anesthesia Postprocedure Evaluation (Signed)
Anesthesia Post Note  Patient: Ann [name]  Procedure(s) Performed: CYSTOSCOPY/URETEROSCOPY/STENT EXCHANGE (Right )     Patient location during evaluation: PACU Anesthesia Type: General Level of consciousness: awake and alert Pain management: pain level controlled Vital Signs Assessment: post-procedure vital signs reviewed and stable Respiratory status: spontaneous breathing, nonlabored ventilation, respiratory function stable and patient connected to nasal cannula oxygen Cardiovascular status: blood pressure returned to baseline and stable Postop Assessment: no apparent nausea or vomiting Anesthetic complications: no    Last Vitals:  Vitals:   05/23/19 1645 05/23/19 1700  BP: (!) 144/89   Pulse: 60 61  Resp: (!) 21 17  Temp:    SpO2: 100% 99%    Last Pain:  Vitals:   05/23/19 1645  TempSrc:   PainSc: 5                  Kaleeah Gingerich S

## 2019-05-23 NOTE — Discharge Instructions (Signed)
Ureteral Stent Implantation, Care After This sheet gives you information about how to care for yourself after your procedure. Your health care provider may also give you more specific instructions. If you have problems or questions, contact your health care provider.  Removal of the stent: remove the stent by pulling the string slowly until the entire stent is removed on Thursday morning, May 26, 2019.   What can I expect after the procedure? After the procedure, it is common to have:  Nausea.  Mild pain when you urinate. You may feel this pain in your lower back or lower abdomen. The pain should stop within a few minutes after you urinate. This may last for up to 1 week.  A small amount of blood in your urine for several days. Follow these instructions at home: Medicines  Take over-the-counter and prescription medicines only as told by your health care provider.  If you were prescribed an antibiotic medicine, take it as told by your health care provider. Do not stop taking the antibiotic even if you start to feel better.  Do not drive for 24 hours if you were given a sedative during your procedure.  Ask your health care provider if the medicine prescribed to you requires you to avoid driving or using heavy machinery. Activity  Rest as told by your health care provider.  Avoid sitting for a long time without moving. Get up to take short walks every 1-2 hours. This is important to improve blood flow and breathing. Ask for help if you feel weak or unsteady.  Return to your normal activities as told by your health care provider. Ask your health care provider what activities are safe for you. General instructions   Watch for any blood in your urine. Call your health care provider if the amount of blood in your urine increases.  If you have a catheter: ? Follow instructions from your health care provider about taking care of your catheter and collection bag. ? Do not take baths, swim,  or use a hot tub until your health care provider approves. Ask your health care provider if you may take showers. You may only be allowed to take sponge baths.  Drink enough fluid to keep your urine pale yellow.  Do not use any products that contain nicotine or tobacco, such as cigarettes, e-cigarettes, and chewing tobacco. These can delay healing after surgery. If you need help quitting, ask your health care provider.  Keep all follow-up visits as told by your health care provider. This is important. Contact a health care provider if:  You have pain that gets worse or does not get better with medicine, especially pain when you urinate.  You have difficulty urinating.  You feel nauseous or you vomit repeatedly during a period of more than 2 days after the procedure. Get help right away if:  Your urine is dark red or has blood clots in it.  You are leaking urine (have incontinence).  The end of the stent comes out of your urethra.  You cannot urinate.  You have sudden, sharp, or severe pain in your abdomen or lower back.  You have a fever.  You have swelling or pain in your legs.  You have difficulty breathing. Summary  After the procedure, it is common to have mild pain when you urinate that goes away within a few minutes after you urinate. This may last for up to 1 week.  Watch for any blood in your urine. Call your health  care provider if the amount of blood in your urine increases.  Take over-the-counter and prescription medicines only as told by your health care provider.  Drink enough fluid to keep your urine pale yellow. This information is not intended to replace advice given to you by your health care provider. Make sure you discuss any questions you have with your health care provider. Document Released: 02/23/2013 Document Revised: 03/30/2018 Document Reviewed: 03/31/2018 Elsevier Patient Education  2020 [employer].

## 2019-05-24 ENCOUNTER — Encounter (HOSPITAL_COMMUNITY): Payer: Self-pay | Admitting: Urology

## 2019-06-20 NOTE — Patient Instructions (Addendum)
DUE TO COVID-19 ONLY ONE VISITOR IS ALLOWED TO COME WITH YOU AND STAY IN THE WAITING ROOM ONLY DURING PRE OP AND PROCEDURE DAY OF SURGERY. THE 1 VISITOR MAY VISIT WITH YOU AFTER SURGERY IN YOUR PRIVATE ROOM DURING VISITING HOURS ONLY!  YOU NEED TO HAVE A COVID 19 TEST ON 06-23-19  @_______ , THIS TEST MUST BE DONE BEFORE SURGERY, COME  [place], [place] [place] , [id number].  ([place]) ONCE YOUR COVID TEST IS COMPLETED, PLEASE BEGIN THE QUARANTINE INSTRUCTIONS AS OUTLINED IN YOUR HANDOUT.                RUDOLPH FINNEMORE  06/20/2019   Your procedure is scheduled on: 06-27-19    Report to [facility] Main  Entrance    Report to Admitting at 7:15 AM     Call this number if you have problems the morning of surgery [phone]    Remember: NO SOLID FOOD AFTER MIDNIGHT THE NIGHT PRIOR TO SURGERY. NOTHING BY MOUTH EXCEPT CLEAR LIQUIDS UNTIL 6:45 AM . PLEASE FINISH ENSURE DRINK PER SURGEON ORDER  WHICH NEEDS TO BE COMPLETED AT 6:45 AM .   CLEAR LIQUID DIET   Foods Allowed                                                                     Foods Excluded  Coffee and tea, regular and decaf                             liquids that you cannot  Plain Jell-O any favor except red or purple                                           see through such as: Fruit ices (not with fruit pulp)                                     milk, soups, orange juice  Iced Popsicles                                    All solid food Carbonated beverages, regular and diet                                    Cranberry, grape and apple juices Sports drinks like Gatorade Lightly seasoned clear broth or consume(fat free) Sugar, honey syrup   _____________________________________________________________________       Take these medicines the morning of surgery with A SIP OF WATER: Amlodipine (Norvasc),  Omeprazole (Prilosec), and Topamax (Topiramate). You may use and bring your  inhaler   BRUSH YOUR TEETH MORNING OF SURGERY AND RINSE YOUR MOUTH OUT, NO CHEWING GUM CANDY OR MINTS.  You may not have any metal on your body including hair pins and              piercings     Do not wear jewelry, make-up, lotions, powders or perfumes, deodorant              Do not wear nail polish on your fingernails.  Do not shave  48 hours prior to surgery.             Do not bring valuables to the hospital. [place].  Contacts, dentures or bridgework may not be worn into surgery.  You may bring an overnight bag     Special Instructions: Please bring your mask and tubing for CPAP machine              Please read over the following fact sheets you were given: _____________________________________________________________________             [facility] - Preparing for Surgery Before surgery, you can play an important role.  Because skin is not sterile, your skin needs to be as free of germs as possible.  You can reduce the number of germs on your skin by washing with CHG (chlorahexidine gluconate) soap before surgery.  CHG is an antiseptic cleaner which kills germs and bonds with the skin to continue killing germs even after washing. Please DO NOT use if you have an allergy to CHG or antibacterial soaps.  If your skin becomes reddened/irritated stop using the CHG and inform your nurse when you arrive at Short Stay. Do not shave (including legs and underarms) for at least 48 hours prior to the first CHG shower.  You may shave your face/neck. Please follow these instructions carefully:  1.  Shower with CHG Soap the night before surgery and the  morning of Surgery.  2.  If you choose to wash your hair, wash your hair first as usual with your  normal  shampoo.  3.  After you shampoo, rinse your hair and body thoroughly to remove the  shampoo.                           4.  Use CHG as you would any other  liquid soap.  You can apply chg directly  to the skin and wash                       Gently with a scrungie or clean washcloth.  5.  Apply the CHG Soap to your body ONLY FROM THE NECK DOWN.   Do not use on face/ open                           Wound or open sores. Avoid contact with eyes, ears mouth and genitals (private parts).                       Wash face,  Genitals (private parts) with your normal soap.             6.  Wash thoroughly, paying special attention to the area where your surgery  will be performed.  7.  Thoroughly rinse your body with warm water from the neck down.  8.  DO NOT shower/wash with your normal soap after  using and rinsing off  the CHG Soap.                9.  Pat yourself dry with a clean towel.            10.  Wear clean pajamas.            11.  Place clean sheets on your bed the night of your first shower and do not  sleep with pets. Day of Surgery : Do not apply any lotions/deodorants the morning of surgery.  Please wear clean clothes to the hospital/surgery center.  FAILURE TO FOLLOW THESE INSTRUCTIONS MAY RESULT IN THE CANCELLATION OF YOUR SURGERY PATIENT SIGNATURE_________________________________  NURSE SIGNATURE__________________________________  ________________________________________________________________________   Adam Phenix  An incentive spirometer is a tool that can help keep your lungs clear and active. This tool measures how well you are filling your lungs with each breath. Taking long deep breaths may help reverse or decrease the chance of developing breathing (pulmonary) problems (especially infection) following:  A long period of time when you are unable to move or be active. BEFORE THE PROCEDURE   If the spirometer includes an indicator to show your best effort, your nurse or respiratory therapist will set it to a desired goal.  If possible, sit up straight or lean slightly forward. Try not to slouch.  Hold the incentive  spirometer in an upright position. INSTRUCTIONS FOR USE  1. Sit on the edge of your bed if possible, or sit up as far as you can in bed or on a chair. 2. Hold the incentive spirometer in an upright position. 3. Breathe out normally. 4. Place the mouthpiece in your mouth and seal your lips tightly around it. 5. Breathe in slowly and as deeply as possible, raising the piston or the ball toward the top of the column. 6. Hold your breath for 3-5 seconds or for as long as possible. Allow the piston or ball to fall to the bottom of the column. 7. Remove the mouthpiece from your mouth and breathe out normally. 8. Rest for a few seconds and repeat Steps 1 through 7 at least 10 times every 1-2 hours when you are awake. Take your time and take a few normal breaths between deep breaths. 9. The spirometer may include an indicator to show your best effort. Use the indicator as a goal to work toward during each repetition. 10. After each set of 10 deep breaths, practice coughing to be sure your lungs are clear. If you have an incision (the cut made at the time of surgery), support your incision when coughing by placing a pillow or rolled up towels firmly against it. Once you are able to get out of bed, walk around indoors and cough well. You may stop using the incentive spirometer when instructed by your caregiver.  RISKS AND COMPLICATIONS  Take your time so you do not get dizzy or light-headed.  If you are in pain, you may need to take or ask for pain medication before doing incentive spirometry. It is harder to take a deep breath if you are having pain. AFTER USE  Rest and breathe slowly and easily.  It can be helpful to keep track of a log of your progress. Your caregiver can provide you with a simple table to help with this. If you are using the spirometer at home, follow these instructions: [place] IF:   You are having difficultly using the spirometer.  You have trouble  using the  spirometer as often as instructed.  Your pain medication is not giving enough relief while using the spirometer.  You develop fever of 100.5 F (38.1 C) or higher. SEEK IMMEDIATE MEDICAL CARE IF:   You cough up bloody sputum that had not been present before.  You develop fever of 102 F (38.9 C) or greater.  You develop worsening pain at or near the incision site. MAKE SURE YOU:   Understand these instructions.  Will watch your condition.  Will get help right away if you are not doing well or get worse. Document Released: 11/03/2006 Document Revised: 09/15/2011 Document Reviewed: 01/04/2007 ExitCare Patient Information 2014 [employer], [place].   ________________________________________________________________________  WHAT IS A BLOOD TRANSFUSION? Blood Transfusion Information  A transfusion is the replacement of blood or some of its parts. Blood is made up of multiple cells which provide different functions.  Red blood cells carry oxygen and are used for blood loss replacement.  White blood cells fight against infection.  Platelets control bleeding.  Plasma helps clot blood.  Other blood products are available for specialized needs, such as hemophilia or other clotting disorders. BEFORE THE TRANSFUSION  Who gives blood for transfusions?   Healthy volunteers who are fully evaluated to make sure their blood is safe. This is blood bank blood. Transfusion therapy is the safest it has ever been in the practice of medicine. Before blood is taken from a donor, a complete history is taken to make sure that person has no history of diseases nor engages in risky social behavior (examples are intravenous drug use or sexual activity with multiple partners). The donor's travel history is screened to minimize risk of transmitting infections, such as malaria. The donated blood is tested for signs of infectious diseases, such as HIV and hepatitis. The blood is then tested to be sure it is  compatible with you in order to minimize the chance of a transfusion reaction. If you or a relative donates blood, this is often done in anticipation of surgery and is not appropriate for emergency situations. It takes many days to process the donated blood. RISKS AND COMPLICATIONS Although transfusion therapy is very safe and saves many lives, the main dangers of transfusion include:   Getting an infectious disease.  Developing a transfusion reaction. This is an allergic reaction to something in the blood you were given. Every precaution is taken to prevent this. The decision to have a blood transfusion has been considered carefully by your caregiver before blood is given. Blood is not given unless the benefits outweigh the risks. AFTER THE TRANSFUSION  Right after receiving a blood transfusion, you will usually feel much better and more energetic. This is especially true if your red blood cells have gotten low (anemic). The transfusion raises the level of the red blood cells which carry oxygen, and this usually causes an energy increase.  The nurse administering the transfusion will monitor you carefully for complications. HOME CARE INSTRUCTIONS  No special instructions are needed after a transfusion. You may find your energy is better. Speak with your caregiver about any limitations on activity for underlying diseases you may have. SEEK MEDICAL CARE IF:   Your condition is not improving after your transfusion.  You develop redness or irritation at the intravenous (IV) site. SEEK IMMEDIATE MEDICAL CARE IF:  Any of the following symptoms occur over the next 12 hours:  Shaking chills.  You have a temperature by mouth above 102 F (38.9 C), not controlled by medicine.  Chest, back, or muscle pain.  People around you feel you are not acting correctly or are confused.  Shortness of breath or difficulty breathing.  Dizziness and fainting.  You get a rash or develop hives.  You have  a decrease in urine output.  Your urine turns a dark color or changes to pink, red, or brown. Any of the following symptoms occur over the next 10 days:  You have a temperature by mouth above 102 F (38.9 C), not controlled by medicine.  Shortness of breath.  Weakness after normal activity.  The white part of the eye turns yellow (jaundice).  You have a decrease in the amount of urine or are urinating less often.  Your urine turns a dark color or changes to pink, red, or brown. Document Released: 06/20/2000 Document Revised: 09/15/2011 Document Reviewed: 02/07/2008 [facility] Patient Information 2014 [place], [place].  _______________________________________________________________________

## 2019-06-22 ENCOUNTER — Encounter (HOSPITAL_COMMUNITY): Payer: Self-pay

## 2019-06-22 ENCOUNTER — Other Ambulatory Visit: Payer: Self-pay

## 2019-06-22 ENCOUNTER — Encounter (HOSPITAL_COMMUNITY)
Admission: RE | Admit: 2019-06-22 | Discharge: 2019-06-22 | Disposition: A | Discharge status: Home / Self Care | Payer: Medicaid Other | Source: Ambulatory Visit | Attending: Orthopedic Surgery | Admitting: Orthopedic Surgery

## 2019-06-22 DIAGNOSIS — Z01812 Encounter for preprocedural laboratory examination: Secondary | ICD-10-CM | POA: Diagnosis present

## 2019-06-23 ENCOUNTER — Other Ambulatory Visit (HOSPITAL_COMMUNITY)
Admission: RE | Admit: 2019-06-23 | Discharge: 2019-06-23 | Disposition: A | Discharge status: Home / Self Care | Payer: Medicaid Other | Source: Ambulatory Visit | Attending: Orthopedic Surgery | Admitting: Orthopedic Surgery

## 2019-06-23 ENCOUNTER — Encounter (HOSPITAL_COMMUNITY)
Admission: RE | Admit: 2019-06-23 | Discharge: 2019-06-23 | Disposition: A | Discharge status: Home / Self Care | Payer: Medicaid Other | Source: Ambulatory Visit | Attending: Orthopedic Surgery | Admitting: Orthopedic Surgery

## 2019-06-23 DIAGNOSIS — Z20828 Contact with and (suspected) exposure to other viral communicable diseases: Secondary | ICD-10-CM | POA: Insufficient documentation

## 2019-06-23 DIAGNOSIS — Z01812 Encounter for preprocedural laboratory examination: Secondary | ICD-10-CM | POA: Diagnosis present

## 2019-06-23 DIAGNOSIS — M1711 Unilateral primary osteoarthritis, right knee: Secondary | ICD-10-CM | POA: Insufficient documentation

## 2019-06-23 LAB — CBC WITH DIFFERENTIAL/PLATELET
Abs Immature Granulocytes: 0.01 10*3/uL (ref 0.00–0.07)
Basophils Absolute: 0 10*3/uL (ref 0.0–0.1)
Basophils Relative: 0 %
Eosinophils Absolute: 0.1 10*3/uL (ref 0.0–0.5)
Eosinophils Relative: 2 %
HCT: 41.5 % (ref 36.0–46.0)
Hemoglobin: 13 g/dL (ref 12.0–15.0)
Immature Granulocytes: 0 %
Lymphocytes Relative: 29 %
Lymphs Abs: 1.4 10*3/uL (ref 0.7–4.0)
MCH: 31.3 pg (ref 26.0–34.0)
MCHC: 31.3 g/dL (ref 30.0–36.0)
MCV: 100 fL (ref 80.0–100.0)
Monocytes Absolute: 0.5 10*3/uL (ref 0.1–1.0)
Monocytes Relative: 9 %
Neutro Abs: 2.9 10*3/uL (ref 1.7–7.7)
Neutrophils Relative %: 60 %
Platelets: 246 10*3/uL (ref 150–400)
RBC: 4.15 MIL/uL (ref 3.87–5.11)
RDW: 13.8 % (ref 11.5–15.5)
WBC: 4.9 10*3/uL (ref 4.0–10.5)
nRBC: 0 % (ref 0.0–0.2)

## 2019-06-23 LAB — URINALYSIS, ROUTINE W REFLEX MICROSCOPIC
Bilirubin Urine: NEGATIVE
Glucose, UA: NEGATIVE mg/dL
Hgb urine dipstick: NEGATIVE
Ketones, ur: NEGATIVE mg/dL
Leukocytes,Ua: NEGATIVE
Nitrite: NEGATIVE
Protein, ur: NEGATIVE mg/dL
Specific Gravity, Urine: 1.026 (ref 1.005–1.030)
pH: 5 (ref 5.0–8.0)

## 2019-06-23 LAB — BASIC METABOLIC PANEL
Anion gap: 12 (ref 5–15)
BUN: 18 mg/dL (ref 8–23)
CO2: 21 mmol/L — ABNORMAL LOW (ref 22–32)
Calcium: 8.6 mg/dL — ABNORMAL LOW (ref 8.9–10.3)
Chloride: 109 mmol/L (ref 98–111)
Creatinine, Ser: 1.03 mg/dL — ABNORMAL HIGH (ref 0.44–1.00)
GFR calc Af Amer: >60 mL/min (ref >60)
GFR calc non Af Amer: 59 mL/min — ABNORMAL LOW (ref >60)
Glucose, Bld: 103 mg/dL — ABNORMAL HIGH (ref 70–99)
Potassium: 3.9 mmol/L (ref 3.5–5.1)
Sodium: 142 mmol/L (ref 135–145)

## 2019-06-23 LAB — SURGICAL PCR SCREEN
MRSA, PCR: NEGATIVE
Staphylococcus aureus: POSITIVE — AB

## 2019-06-23 LAB — HEMOGLOBIN A1C
Hgb A1c MFr Bld: 5.7 % — ABNORMAL HIGH (ref 4.8–5.6)
Mean Plasma Glucose: 116.89 mg/dL

## 2019-06-23 LAB — PROTIME-INR
INR: 0.9 (ref 0.8–1.2)
Prothrombin Time: 12.3 seconds (ref 11.4–15.2)

## 2019-06-23 LAB — APTT: aPTT: 29 seconds (ref 24–36)

## 2019-06-23 NOTE — H&P (Signed)
TOTAL KNEE ADMISSION H&P  Patient is being admitted for right total knee arthroplasty.  Subjective:  Chief Complaint:right knee pain.  HPI: Ann [name], 63 y.o. female, has a history of pain and functional disability in the right knee due to arthritis and has failed non-surgical conservative treatments for greater than 12 weeks to includeNSAID's and/or analgesics, corticosteriod injections, viscosupplementation injections, flexibility and strengthening excercises, use of assistive devices, weight reduction as appropriate and activity modification.  Onset of symptoms was gradual, starting 2 years ago with gradually worsening course since that time. The patient noted no past surgery on the right knee(s).  Patient currently rates pain in the right knee(s) at 10 out of 10 with activity. Patient has night pain, worsening of pain with activity and weight bearing, pain that interferes with activities of daily living, pain with passive range of motion, crepitus and joint swelling.  Patient has evidence of periarticular osteophytes and joint space narrowing by imaging studies.  There is no active infection.  Patient Active Problem List   Diagnosis Date Noted  . Hydronephrosis, right 05/08/2019  . S/P total knee arthroplasty, left 03/28/2019  . Degenerative arthritis of left knee 03/18/2019  . Rash 08/10/2018  . Pruritic rash 08/09/2018  . Headache 08/09/2018  . Osteoarthritis of right knee 08/28/2017  . Abnormal CT scan, colon   . HLD (hyperlipidemia) 06/03/2017  . 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 GERD (gastroesophageal reflux disease) 06/03/2017  . Anxiety 06/03/2017  . Chronic combined systolic (congestive) and diastolic (congestive) heart failure ([place]) 06/03/2017  . Hypokalemia 06/03/2017  . Diverticulitis large intestine 06/03/2017  . Lower abdominal pain 06/03/2017  . Non-intractable vomiting with nausea   . Abnormal CT of the abdomen   . Generalized abdominal pain   . LFT elevation    . Diverticulitis of colon 05/18/2017  . Symptomatic cholelithiasis 02/10/2017  . Hypoxia 01/11/2017  . Dyspnea 01/11/2017  . Dysphagia 04/04/2015  . History of esophageal stricture 04/04/2015  . Morbid (severe) obesity due to excess calories ([place]) 04/01/2015  . ACE-inhibitor cough 03/12/2015  . Upper airway cough syndrome 03/12/2015  . Laryngopharyngeal reflux (LPR) 03/12/2015  . Elevated lipids 07/18/2014  . Chest pain 07/18/2014  . Internal hemorrhoids 04/25/2014  . Ureteral stone with hydronephrosis 04/22/2014  . Post-op pain 04/21/2014  . Rectocele, grade 3 02/20/2014  . Female rectocele without uterine prolapse 08/04/2013  . Unspecified constipation 07/23/2013  . Chronic respiratory failure with hypoxia ([place]) 06/07/2013  . Cystocele 06/06/2013  . Anosmia 01/25/2013  . Unspecified nutritional deficiency   . Polyneuropathy in other diseases classified elsewhere ([place])   . OSA (obstructive sleep apnea)   . Cough 07/27/2012  . Essential hypertension    Past Medical History:  Diagnosis Date  . Adenoma    throat  . Adenoma of colon   . Allergy    seasonal  . Anemia    with past pregnancy -not recent  . Anxiety   . Arthritis   . Borderline diabetes    diet controlled - no meds, history of  . CHF (congestive heart failure) ([place])   . Chronic cystitis   . CKD (chronic kidney disease), stage II   . Complication of anesthesia    02-20-2014 ([facility]) INTRA-OP RESPIRATORY FAILURE SECONDARY TO POSSIBLE MUCOUS ASPIRATION/  ALSO 06-06-2013 [facility]) POST-OP DESATURATION  EVEN USING CPAP, States some issues if Propofol given to rapidly.  Marland Kitchen COPD (chronic obstructive pulmonary disease) ([place])   . Diverticulitis   . Diverticulosis of colon   .  Dyspnea    only needs O2 at Night 4L  . Family history of adverse reaction to anesthesia    PER PT SISTER DIED AFTER GENERAL ANESTHESIA DUE TO UNDIAGNOSED OSA  . GERD (gastroesophageal reflux disease)   . H/O hiatal hernia   . Headache(784.0)     migraines, decreased since turning 50's  . History of chronic cough    "tight sounding cough"  . History of esophageal dilatation   . History of kidney stones   . History of MRSA infection    3/ 2015  AXILLARY ABSCESS  . History of recurrent UTIs   . History of TIAs NO RESIDUAL   1980;  2005;   2008 PT STATES PER CT  SCARRING RIGHT SIDE OF BRAIN  . Hyperlipidemia   . Hypertension   . Inguinal hernia   . Neuromuscular disorder ([place])    HH  . Neuropathy    pt denies this 07-05-2015  . Nocturia   . OSA on CPAP    PER SLEEP STUDY 09-08-2012  MODERATE OSA. not used in awhile, but thinks needs to start back using due to some weight gain.  . Osteoarthritis of knee    Right  . Pneumonia   . Skin cancer   . Stroke [facility]) [phone]   TIA'S slight weakness on lt leg  . SUI (stress urinary incontinence, female)   . SVD (spontaneous vaginal delivery)    x 3    Past Surgical History:  Procedure Laterality Date  . ANTERIOR AND POSTERIOR REPAIR N/A 06/06/2013   Procedure: Anterior vaginal vault repair, Sacrospinous ligament fixation with UPHOLD lite, Kelly plication, Sacrospinous mesh fixation, Solyx transurethral sling;  Surgeon: Ailene Rud, MD;  Location: [facility];  Service: Urology;  Laterality: N/A;  . CHOLECYSTECTOMY N/A 02/10/2017   Procedure: LAPAROSCOPIC CHOLECYSTECTOMY;  Surgeon: Coralie Keens, MD;  Location: [facility];  Service: General;  Laterality: N/A;  . COLECTOMY WITH COLOSTOMY CREATION/HARTMANN PROCEDURE N/A 06/11/2017   Procedure: OPEN SIGMOID COLECTOMY;  Surgeon: [name], MD;  Location[facility];  Service: General;  Laterality: N/A;  . COLONOSCOPY  08/2016   polyps - repeat 5 years Arm[facility]  . CYSTOSCOPY W/ URETERAL STENT PLACEMENT Right 04/21/2014   Procedure: CYSTOSCOPY WITH RETROGRADE PYELOGRAM/URETERAL STENT PLACEMENT;  Surgeon: Ailene Rud, MD;  [facility];  Service: Urology;  Laterality: Right;  . CYSTOSCOPY W/  URETERAL STENT PLACEMENT Right 11/21/2014   Procedure: CYSTOSCOPY WITH RETROGRADE PYELOGRAM, URETEROSCOPY WITH STONE REMOVAL, URETERAL STENT PLACEMENT;  Surgeon: Carolan Clines, MD;  [facility];  Service: Urology;  Laterality: Right;  . CYSTOSCOPY W/ URETERAL STENT PLACEMENT Left 02/01/2016   Procedure: CYSTO, LEFT URETEROSCOPY, LEFT RETROGRADE AND LEFT URETERAL STENT PLACEMENT;  Surgeon: Carolan Clines, MD;  [facility];  Service: Urology;  Laterality: Left;  . CYSTOSCOPY W/ URETERAL STENT PLACEMENT Right 05/08/2019   Procedure: CYSTOSCOPY WITH RETROGRADE PYELOGRAM/URETERAL STENT PLACEMENT;  Surgeon: Festus Aloe, MD;  [facility];  Service: Urology;  Laterality: Right;  . CYSTOSCOPY WITH RETROGRADE PYELOGRAM, URETEROSCOPY AND STENT PLACEMENT Right 06/05/2014   Procedure: CYSTOSCOPY WITH RIGHT RETROGRADE PYELOGRAM, RIGHT URETEROSCOPY, STONE EXTRACTION AND RIGHT DOUBLE J STENT PLACEMENT;  Surgeon: Ailene Rud, MD;  [facility];  Service: Urology;  Laterality: Right;  . CYSTOSCOPY/URETEROSCOPY/HOLMIUM LASER/STENT PLACEMENT Right 05/23/2019   Procedure: CYSTOSCOPY/URETEROSCOPY/STENT EXCHANGE;  Surgeon: Festus Aloe, MD;  [facility];  Service: Urology;  Laterality: Right;  ONLY NEEDS 60 MIN  . ESOPHAGEAL DILATION  X2  LAST ONE  JUNE 2014   has had 9 of these  . ESOPHAGEAL MANOMETRY N/A 09/10/2015   Procedure: ESOPHAGEAL MANOMETRY (EM);  Surgeon: Manus Gunning, MD;  [facility];  Service: Gastroenterology;  Laterality: N/A;  . FRACTURE SURGERY  09/2018   broken nose no surgery  . HOLMIUM LASER APPLICATION Right [id number]   Procedure: HOLMIUM LASER OF STONE ;  Surgeon: Ailene Rud, MD;  Location[facility];  Service: Urology;  Laterality: Right;  . KNEE ARTHROSCOPY Bilateral 2008   x 2  . LAPAROSCOPIC CHOLECYSTECTOMY  02/10/2017  . MULTIPLE TOOTH EXTRACTIONS     past hx  . RECTOCELE  REPAIR N/A 02/20/2014   Procedure: POSTERIOR REPAIR (RECTOCELE) WITH VAGINAL VAULT REPAIR WITH ACELL GRAFT PLACEMENT, PERINEOPLASTY, ENTEROCELE REPAIR;  Surgeon: Ailene Rud, MD;  [facility];  Service: Urology;  Laterality: N/A;  . TOTAL KNEE ARTHROPLASTY Left 03/28/2019   Procedure: LEFT TOTAL KNEE ARTHROPLASTY;  Surgeon: Frederik Pear, MD;  [facility];  Service: Orthopedics;  Laterality: Left;  . TUBAL LIGATION  1987  . UPPER GASTROINTESTINAL ENDOSCOPY  07/29/2018   Arm[facility]uster - need to repeat per MD  . Arnetha Courser TOOTH EXTRACTION      No current facility-administered medications for this encounter.   Current Outpatient Medications  Medication Sig Dispense Refill Last Dose  . amLODipine (NORVASC) 10 MG tablet Take 10 mg by mouth daily.      Marland Kitchen atorvastatin (LIPITOR) 20 MG tablet Take 10 mg by mouth at bedtime.   3   . budesonide-formoterol (SYMBICORT) 160-4.5 MCG/ACT inhaler Inhale 2 puffs into the lungs 2 (two) times daily as needed (Shortness of breath).      . CELEBREX 200 MG capsule Take 200 mg by mouth 2 (two) times daily.     . chlorpheniramine (CHLOR-TRIMETON) 4 MG tablet Take 1 tablet (4 mg total) by mouth at bedtime. (Patient not taking: Reported on 05/09/2019) 14 tablet 0   . clonazePAM (KLONOPIN) 0.5 MG tablet Take 0.5 mg by mouth at bedtime. Can take BID     . DULoxetine (CYMBALTA) 30 MG capsule Take 30 mg by mouth daily.      Marland Kitchen gabapentin (NEURONTIN) 300 MG capsule Take 300 mg by mouth 3 (three) times daily.     . hydrochlorothiazide (HYDRODIURIL) 25 MG tablet Take 25 mg by mouth daily as needed (swelling).      . metoprolol succinate (TOPROL-XL) 50 MG 24 hr tablet Take 50 mg by mouth at bedtime.   3   . omeprazole (PRILOSEC) 40 MG capsule TAKE 1 CAPSULE(40 MG) BY MOUTH TWICE DAILY (Patient taking differently: Take 40 mg by mouth 2 (two) times daily. ) 120 capsule 0   . OXYGEN Inhale 4 L into the lungs at bedtime.     Marland Kitchen tiZANidine (ZANAFLEX) 2 MG tablet Take 1  tablet (2 mg total) by mouth every 6 (six) hours as needed. (Patient taking differently: Take 2 mg by mouth every 8 (eight) hours as needed for muscle spasms. ) 60 tablet 0   . topiramate (TOPAMAX) 100 MG tablet Take 1 tablet (100 mg total) by mouth 2 (two) times daily. 60 tablet 11   . traZODone (DESYREL) 50 MG tablet Take 50 mg by mouth at bedtime as needed for sleep.      Marland Kitchen acetaminophen (TYLENOL) 325 MG tablet Take 650 mg by mouth every 6 (six) hours as needed for mild pain or headache.     Marland Kitchen  aspirin EC 81 MG tablet Take 1 tablet (81 mg total) by mouth 2 (two) times daily. (Patient not taking: Reported on 06/22/2019) 60 tablet 0 Not Taking at Unknown time  . docusate sodium (COLACE) 100 MG capsule Take 1 capsule (100 mg total) by mouth 2 (two) times daily. (Patient not taking: Reported on 06/22/2019) 10 capsule 0   . iron polysaccharides (NIFEREX) 150 MG capsule Take 1 capsule (150 mg total) by mouth daily. (Patient not taking: Reported on 06/22/2019) 30 capsule 0   . ondansetron (ZOFRAN ODT) 4 MG disintegrating tablet Take 1 tablet (4 mg total) by mouth every 8 (eight) hours as needed for nausea or vomiting. (Patient not taking: Reported on 06/22/2019) 20 tablet 0   . oxyCODONE (OXY IR/ROXICODONE) 5 MG immediate release tablet Take 1 tablet (5 mg total) by mouth every 4 (four) hours as needed for moderate pain. (Patient not taking: Reported on 06/22/2019) 30 tablet 0   . polyethylene glycol (MIRALAX / GLYCOLAX) 17 g packet Take 17 g by mouth daily. (Patient not taking: Reported on 06/22/2019) 14 each 0   . promethazine (PHENERGAN) 12.5 MG tablet Take 12.5 mg by mouth every 6 (six) hours as needed for nausea or vomiting.     . senna (SENOKOT) 8.6 MG TABS tablet Take 1 tablet (8.6 mg total) by mouth 2 (two) times daily. (Patient not taking: Reported on 06/22/2019) 120 tablet 0   . tamsulosin (FLOMAX) 0.4 MG CAPS capsule Take 1 capsule (0.4 mg total) by mouth daily after supper. (Patient not taking:  Reported on 06/22/2019) 30 capsule 0    Allergies  Allergen Reactions  . Lisinopril Cough  . Lidocaine     Increase blood pressure  . Doxycycline Diarrhea    Severe headaches  . Metronidazole Other (See Comments)    Severe headaches BUT CAN BE TOLERATED    Social History   Tobacco Use  . Smoking status: Never Smoker  . Smokeless tobacco: Never Used  Substance Use Topics  . Alcohol use: No    Alcohol/week: 0.0 standard drinks    Family History  Problem Relation Age of Onset  . Colon cancer Father 21       died at 1  . Heart disease Mother        died at 74  . Heart attack Mother   . Heart attack Sister        died at 53  . Hypertension Brother   . Heart attack Cousin 21       with death  . Heart attack Cousin 25       died with MI  . Esophageal cancer Neg Hx   . Rectal cancer Neg Hx   . Stomach cancer Neg Hx   . Colon polyps Neg Hx      Review of Systems  Constitutional: Positive for fatigue.  HENT: Negative.   Eyes: Negative.   Respiratory: Negative.   Cardiovascular: Negative.   Gastrointestinal: Positive for anal bleeding.  Endocrine: Negative.   Genitourinary: Negative.   Musculoskeletal: Positive for arthralgias.  Skin: Negative.   Neurological: Positive for headaches.  Hematological: Negative.   Psychiatric/Behavioral: Negative.     Objective:  Physical Exam  Constitutional: She is oriented to person, place, and time. She appears well-developed and well-nourished.  HENT:  Head: Normocephalic and atraumatic.  Eyes: Pupils are equal, round, and reactive to light.  Cardiovascular: Intact distal pulses.  Respiratory: Effort normal.  Musculoskeletal:        General:  Tenderness present.     Cervical back: Normal range of motion and neck supple.     Comments:  Right knee has a 5 flexion contracture tender along the joint lines trace effusion pain with attempted full extension and flexion past 125 collateral ligaments are stable previous  radiographs if shown end-stage arthritis.  Neurological: She is alert and oriented to person, place, and time.  Skin: Skin is warm and dry.  Psychiatric: She has a normal mood and affect. Her behavior is normal. Judgment and thought content normal.    Vital signs in last 24 hours: Temp:  [98.5 F (36.9 C)] 98.5 F (36.9 C) (12/17 1411) Pulse Rate:  [70] 70 (12/17 1411) Resp:  [20] 20 (12/17 1411) BP: (147)/(89) 147/89 (12/17 1411) SpO2:  [98 %] 98 % (12/17 1411) Weight:  [91.6 kg] 91.6 kg (12/17 1411)  Labs:   Estimated body mass index is 36.95 kg/m as calculated from the following:   Height as of 06/23/19: 5\' 2"  (1.575 m).   Weight as of 06/23/19: 91.6 kg.   Imaging Review Plain radiographs demonstrate  AP Rosen were lateral and sunrise x-rays show progressive end-stage arthritis of right knee bone-on-bone lateral subluxation of tibia beneath femur and peripheral osteophytes.      Assessment/Plan:  End stage arthritis, right knee   The patient history, physical examination, clinical judgment of the provider and imaging studies are consistent with end stage degenerative joint disease of the right knee(s) and total knee arthroplasty is deemed medically necessary. The treatment options including medical management, injection therapy arthroscopy and arthroplasty were discussed at length. The risks and benefits of total knee arthroplasty were presented and reviewed. The risks due to aseptic loosening, infection, stiffness, patella tracking problems, thromboembolic complications and other imponderables were discussed. The patient acknowledged the explanation, agreed to proceed with the plan and consent was signed. Patient is being admitted for inpatient treatment for surgery, pain control, PT, OT, prophylactic antibiotics, VTE prophylaxis, progressive ambulation and ADL's and discharge planning. The patient is planning to be discharged home with home health services     Patient's  anticipated LOS is less than 2 midnights, meeting these requirements: - Younger than 3 - Lives within 1 hour of care - Has a competent adult at home to recover with post-op recover - NO history of  - Chronic pain requiring opiods  - Diabetes  - Coronary Artery Disease  - Heart failure  - Heart attack  - Stroke  - DVT/VTE  - Cardiac arrhythmia  - Respiratory Failure/COPD  - Renal failure  - Anemia  - Advanced Liver disease

## 2019-06-23 NOTE — Care Plan (Signed)
Spoke with patient prior to surgery. She plans to discharge to home with family and HHPT. Referral to [facility]. She will go home same day if pain is controlled and passes PT. She has all needed equipment at home. She will transition to OPPT at [facility] on 07/13/19.    Ladell Heads, [place]

## 2019-06-24 LAB — NOVEL CORONAVIRUS, NAA (HOSP ORDER, SEND-OUT TO REF LAB; TAT 18-24 HRS): SARS-CoV-2, NAA: NOT DETECTED

## 2019-06-24 NOTE — Progress Notes (Signed)
+   Antibodies, per lab.  T&S to be recollected on day of surgery.

## 2019-06-27 ENCOUNTER — Encounter (HOSPITAL_COMMUNITY): Payer: Self-pay | Admitting: Orthopedic Surgery

## 2019-06-27 ENCOUNTER — Ambulatory Visit (HOSPITAL_COMMUNITY): Payer: Medicaid Other | Admitting: Anesthesiology

## 2019-06-27 ENCOUNTER — Ambulatory Visit (HOSPITAL_COMMUNITY): Payer: Medicaid Other | Admitting: Physician Assistant

## 2019-06-27 ENCOUNTER — Encounter (HOSPITAL_COMMUNITY)
Admission: RE | Disposition: A | Discharge status: Home / Self Care | Payer: Self-pay | Source: Home / Self Care | Attending: Orthopedic Surgery

## 2019-06-27 ENCOUNTER — Other Ambulatory Visit: Payer: Self-pay

## 2019-06-27 ENCOUNTER — Ambulatory Visit (HOSPITAL_COMMUNITY)
Admission: RE | Admit: 2019-06-27 | Discharge: 2019-06-28 | Disposition: A | Discharge status: Home / Self Care | Payer: Medicaid Other | Attending: Orthopedic Surgery | Admitting: Orthopedic Surgery

## 2019-06-27 DIAGNOSIS — I5042 Chronic combined systolic (congestive) and diastolic (congestive) heart failure: Secondary | ICD-10-CM | POA: Insufficient documentation

## 2019-06-27 DIAGNOSIS — Z85828 Personal history of other malignant neoplasm of skin: Secondary | ICD-10-CM | POA: Insufficient documentation

## 2019-06-27 DIAGNOSIS — Z87442 Personal history of urinary calculi: Secondary | ICD-10-CM | POA: Insufficient documentation

## 2019-06-27 DIAGNOSIS — N182 Chronic kidney disease, stage 2 (mild): Secondary | ICD-10-CM | POA: Insufficient documentation

## 2019-06-27 DIAGNOSIS — J9611 Chronic respiratory failure with hypoxia: Secondary | ICD-10-CM | POA: Insufficient documentation

## 2019-06-27 DIAGNOSIS — Z8249 Family history of ischemic heart disease and other diseases of the circulatory system: Secondary | ICD-10-CM | POA: Insufficient documentation

## 2019-06-27 DIAGNOSIS — F419 Anxiety disorder, unspecified: Secondary | ICD-10-CM | POA: Diagnosis not present

## 2019-06-27 DIAGNOSIS — Z96652 Presence of left artificial knee joint: Secondary | ICD-10-CM | POA: Diagnosis not present

## 2019-06-27 DIAGNOSIS — M25761 Osteophyte, right knee: Secondary | ICD-10-CM | POA: Diagnosis not present

## 2019-06-27 DIAGNOSIS — M1711 Unilateral primary osteoarthritis, right knee: Secondary | ICD-10-CM

## 2019-06-27 DIAGNOSIS — G629 Polyneuropathy, unspecified: Secondary | ICD-10-CM | POA: Diagnosis not present

## 2019-06-27 DIAGNOSIS — Z8673 Personal history of transient ischemic attack (TIA), and cerebral infarction without residual deficits: Secondary | ICD-10-CM | POA: Diagnosis not present

## 2019-06-27 DIAGNOSIS — Z79899 Other long term (current) drug therapy: Secondary | ICD-10-CM | POA: Insufficient documentation

## 2019-06-27 DIAGNOSIS — Z791 Long term (current) use of non-steroidal anti-inflammatories (NSAID): Secondary | ICD-10-CM | POA: Insufficient documentation

## 2019-06-27 DIAGNOSIS — E785 Hyperlipidemia, unspecified: Secondary | ICD-10-CM | POA: Insufficient documentation

## 2019-06-27 DIAGNOSIS — J449 Chronic obstructive pulmonary disease, unspecified: Secondary | ICD-10-CM | POA: Diagnosis not present

## 2019-06-27 DIAGNOSIS — G4733 Obstructive sleep apnea (adult) (pediatric): Secondary | ICD-10-CM | POA: Diagnosis not present

## 2019-06-27 DIAGNOSIS — R7303 Prediabetes: Secondary | ICD-10-CM | POA: Diagnosis not present

## 2019-06-27 DIAGNOSIS — I110 Hypertensive heart disease with heart failure: Secondary | ICD-10-CM | POA: Insufficient documentation

## 2019-06-27 DIAGNOSIS — D62 Acute posthemorrhagic anemia: Secondary | ICD-10-CM | POA: Diagnosis not present

## 2019-06-27 DIAGNOSIS — Z6836 Body mass index (BMI) 36.0-36.9, adult: Secondary | ICD-10-CM | POA: Insufficient documentation

## 2019-06-27 DIAGNOSIS — Z7951 Long term (current) use of inhaled steroids: Secondary | ICD-10-CM | POA: Insufficient documentation

## 2019-06-27 DIAGNOSIS — Z8614 Personal history of Methicillin resistant Staphylococcus aureus infection: Secondary | ICD-10-CM | POA: Diagnosis not present

## 2019-06-27 DIAGNOSIS — K219 Gastro-esophageal reflux disease without esophagitis: Secondary | ICD-10-CM | POA: Diagnosis not present

## 2019-06-27 DIAGNOSIS — Z7982 Long term (current) use of aspirin: Secondary | ICD-10-CM | POA: Insufficient documentation

## 2019-06-27 DIAGNOSIS — Z96651 Presence of right artificial knee joint: Secondary | ICD-10-CM

## 2019-06-27 DIAGNOSIS — Z01818 Encounter for other preprocedural examination: Secondary | ICD-10-CM

## 2019-06-27 HISTORY — PX: TOTAL KNEE ARTHROPLASTY: SHX125

## 2019-06-27 LAB — TYPE AND SCREEN
ABO/RH(D): A NEG
Antibody Screen: POSITIVE
Unit division: 0
Unit division: 0
Weak D: POSITIVE

## 2019-06-27 LAB — BPAM RBC
Blood Product Expiration Date: 202101082359
Blood Product Expiration Date: 202101082359
Unit Type and Rh: 600
Unit Type and Rh: 600

## 2019-06-27 SURGERY — ARTHROPLASTY, KNEE, TOTAL
Anesthesia: Regional | Site: Knee | Laterality: Right

## 2019-06-27 MED ORDER — LACTATED RINGERS IV BOLUS: 250.0000 mL | Freq: Once | INTRAVENOUS | Status: DC

## 2019-06-27 MED ORDER — SODIUM CHLORIDE 0.9 % IR SOLN
Status: DC | PRN
  Administered 2019-06-27: 3000 mL

## 2019-06-27 MED ORDER — SODIUM CHLORIDE (PF) 0.9 % IJ SOLN
INTRAMUSCULAR | Status: AC
  Filled 2019-06-27: qty 50

## 2019-06-27 MED ORDER — PROPOFOL 10 MG/ML IV BOLUS
INTRAVENOUS | Status: DC | PRN
  Administered 2019-06-27: 20 mg via INTRAVENOUS
  Administered 2019-06-27 (×2): 50 mg via INTRAVENOUS

## 2019-06-27 MED ORDER — FENTANYL CITRATE (PF) 100 MCG/2ML IJ SOLN
25.0000 ug | INTRAMUSCULAR | Status: DC | PRN
  Administered 2019-06-27 (×2): 50 ug via INTRAVENOUS

## 2019-06-27 MED ORDER — TRAZODONE HCL 50 MG PO TABS: 50.0000 mg | ORAL_TABLET | Freq: Every evening | ORAL | Status: DC | PRN

## 2019-06-27 MED ORDER — LIDOCAINE 2% (20 MG/ML) 5 ML SYRINGE
INTRAMUSCULAR | Status: AC
  Filled 2019-06-27: qty 10

## 2019-06-27 MED ORDER — DOCUSATE SODIUM 100 MG PO CAPS: 100.0000 mg | ORAL_CAPSULE | Freq: Two times a day (BID) | ORAL | Status: DC

## 2019-06-27 MED ORDER — TRANEXAMIC ACID-NACL 1000-0.7 MG/100ML-% IV SOLN
INTRAVENOUS | Status: AC
  Filled 2019-06-27: qty 100

## 2019-06-27 MED ORDER — EPHEDRINE 5 MG/ML INJ
INTRAVENOUS | Status: AC
  Filled 2019-06-27: qty 10

## 2019-06-27 MED ORDER — METHOCARBAMOL 500 MG IVPB - SIMPLE MED
INTRAVENOUS | Status: AC
  Filled 2019-06-27: qty 50

## 2019-06-27 MED ORDER — OXYCODONE HCL 5 MG PO TABS
5.0000 mg | ORAL_TABLET | ORAL | Status: DC | PRN
  Administered 2019-06-27 – 2019-06-28 (×4): 10 mg via ORAL
  Filled 2019-06-27 (×3): qty 2

## 2019-06-27 MED ORDER — SCOPOLAMINE 1 MG/3DAYS TD PT72
MEDICATED_PATCH | TRANSDERMAL | Status: DC | PRN
  Administered 2019-06-27: 1 via TRANSDERMAL

## 2019-06-27 MED ORDER — SODIUM CHLORIDE 0.9 % IR SOLN
Status: DC | PRN
  Administered 2019-06-27: 1000 mL

## 2019-06-27 MED ORDER — MEPIVACAINE HCL (PF) 2 % IJ SOLN
INTRAMUSCULAR | Status: AC
  Filled 2019-06-27: qty 40

## 2019-06-27 MED ORDER — DOCUSATE SODIUM 100 MG PO CAPS
100.0000 mg | ORAL_CAPSULE | Freq: Two times a day (BID) | ORAL | Status: DC
  Administered 2019-06-27 – 2019-06-28 (×2): 100 mg via ORAL
  Filled 2019-06-27 (×2): qty 1

## 2019-06-27 MED ORDER — OXYCODONE HCL 5 MG PO TABS
ORAL_TABLET | ORAL | Status: AC
  Filled 2019-06-27: qty 2

## 2019-06-27 MED ORDER — CELECOXIB 200 MG PO CAPS
200.0000 mg | ORAL_CAPSULE | Freq: Two times a day (BID) | ORAL | Status: DC
  Administered 2019-06-27 – 2019-06-28 (×2): 200 mg via ORAL
  Filled 2019-06-27 (×2): qty 1

## 2019-06-27 MED ORDER — DULOXETINE HCL 30 MG PO CPEP
30.0000 mg | ORAL_CAPSULE | Freq: Every day | ORAL | Status: DC
  Administered 2019-06-27 – 2019-06-28 (×2): 30 mg via ORAL
  Filled 2019-06-27 (×2): qty 1

## 2019-06-27 MED ORDER — PHENYLEPHRINE 40 MCG/ML (10ML) SYRINGE FOR IV PUSH (FOR BLOOD PRESSURE SUPPORT)
PREFILLED_SYRINGE | INTRAVENOUS | Status: AC
  Filled 2019-06-27: qty 10

## 2019-06-27 MED ORDER — ALUM & MAG HYDROXIDE-SIMETH 200-200-20 MG/5ML PO SUSP: 30.0000 mL | ORAL | Status: DC | PRN

## 2019-06-27 MED ORDER — DEXAMETHASONE SODIUM PHOSPHATE 10 MG/ML IJ SOLN
INTRAMUSCULAR | Status: AC
  Filled 2019-06-27: qty 1

## 2019-06-27 MED ORDER — FENTANYL CITRATE (PF) 250 MCG/5ML IJ SOLN
INTRAMUSCULAR | Status: AC
  Filled 2019-06-27: qty 5

## 2019-06-27 MED ORDER — WATER FOR IRRIGATION, STERILE IR SOLN
Status: DC | PRN
  Administered 2019-06-27 (×2): 1000 mL

## 2019-06-27 MED ORDER — CLONAZEPAM 0.5 MG PO TABS
0.5000 mg | ORAL_TABLET | Freq: Every day | ORAL | Status: DC
  Administered 2019-06-27: 0.5 mg via ORAL
  Filled 2019-06-27: qty 1

## 2019-06-27 MED ORDER — ONDANSETRON HCL 4 MG/2ML IJ SOLN
INTRAMUSCULAR | Status: AC
  Administered 2019-06-27: 15:00:00 4 mg via INTRAVENOUS
  Filled 2019-06-27: qty 2

## 2019-06-27 MED ORDER — PROPOFOL 500 MG/50ML IV EMUL
INTRAVENOUS | Status: AC
  Filled 2019-06-27: qty 300

## 2019-06-27 MED ORDER — FENTANYL CITRATE (PF) 250 MCG/5ML IJ SOLN
INTRAMUSCULAR | Status: DC | PRN
  Administered 2019-06-27 (×2): 25 ug via INTRAVENOUS
  Administered 2019-06-27: 50 ug via INTRAVENOUS
  Administered 2019-06-27 (×3): 25 ug via INTRAVENOUS

## 2019-06-27 MED ORDER — TRANEXAMIC ACID-NACL 1000-0.7 MG/100ML-% IV SOLN
1000.0000 mg | INTRAVENOUS | Status: AC
  Administered 2019-06-27: 1000 mg via INTRAVENOUS
  Filled 2019-06-27: qty 100

## 2019-06-27 MED ORDER — TIZANIDINE HCL 2 MG PO TABS: 2.0000 mg | ORAL_TABLET | Freq: Four times a day (QID) | ORAL | 0 refills | Status: DC | PRN

## 2019-06-27 MED ORDER — ASPIRIN 81 MG PO CHEW
81.0000 mg | CHEWABLE_TABLET | Freq: Two times a day (BID) | ORAL | Status: DC
  Administered 2019-06-27 – 2019-06-28 (×2): 81 mg via ORAL
  Filled 2019-06-27 (×2): qty 1

## 2019-06-27 MED ORDER — CEFAZOLIN SODIUM-DEXTROSE 2-4 GM/100ML-% IV SOLN
2.0000 g | INTRAVENOUS | Status: AC
  Administered 2019-06-27: 2 g via INTRAVENOUS
  Filled 2019-06-27: qty 100

## 2019-06-27 MED ORDER — SCOPOLAMINE 1 MG/3DAYS TD PT72: 1.0000 | MEDICATED_PATCH | TRANSDERMAL | Status: DC

## 2019-06-27 MED ORDER — ONDANSETRON HCL 4 MG PO TABS
4.0000 mg | ORAL_TABLET | Freq: Four times a day (QID) | ORAL | Status: DC | PRN
  Filled 2019-06-27: qty 1

## 2019-06-27 MED ORDER — METHOCARBAMOL 500 MG PO TABS
500.0000 mg | ORAL_TABLET | Freq: Four times a day (QID) | ORAL | Status: DC | PRN
  Administered 2019-06-28: 500 mg via ORAL
  Filled 2019-06-27: qty 1

## 2019-06-27 MED ORDER — SUCCINYLCHOLINE CHLORIDE 200 MG/10ML IV SOSY
PREFILLED_SYRINGE | INTRAVENOUS | Status: AC
  Filled 2019-06-27: qty 10

## 2019-06-27 MED ORDER — TAMSULOSIN HCL 0.4 MG PO CAPS: 0.4000 mg | ORAL_CAPSULE | Freq: Every day | ORAL | Status: DC

## 2019-06-27 MED ORDER — BISACODYL 5 MG PO TBEC: 5.0000 mg | DELAYED_RELEASE_TABLET | Freq: Every day | ORAL | Status: DC | PRN

## 2019-06-27 MED ORDER — FLEET ENEMA 7-19 GM/118ML RE ENEM: 1.0000 | ENEMA | Freq: Once | RECTAL | Status: DC | PRN

## 2019-06-27 MED ORDER — PROPOFOL 10 MG/ML IV BOLUS
INTRAVENOUS | Status: AC
  Filled 2019-06-27: qty 20

## 2019-06-27 MED ORDER — KCL IN DEXTROSE-NACL 20-5-0.45 MEQ/L-%-% IV SOLN: INTRAVENOUS | Status: DC

## 2019-06-27 MED ORDER — TRANEXAMIC ACID-NACL 1000-0.7 MG/100ML-% IV SOLN
1000.0000 mg | Freq: Once | INTRAVENOUS | Status: AC
  Administered 2019-06-27: 15:00:00 1000 mg via INTRAVENOUS

## 2019-06-27 MED ORDER — POVIDONE-IODINE 10 % EX SWAB
2.0000 "application " | Freq: Once | CUTANEOUS | Status: AC
  Administered 2019-06-27: 2 via TOPICAL

## 2019-06-27 MED ORDER — ONDANSETRON HCL 4 MG/2ML IJ SOLN
INTRAMUSCULAR | Status: DC | PRN
  Administered 2019-06-27: 4 mg via INTRAVENOUS

## 2019-06-27 MED ORDER — LACTATED RINGERS IV SOLN: INTRAVENOUS | Status: DC

## 2019-06-27 MED ORDER — BUPIVACAINE LIPOSOME 1.3 % IJ SUSP
20.0000 mL | Freq: Once | INTRAMUSCULAR | Status: DC
  Filled 2019-06-27: qty 20

## 2019-06-27 MED ORDER — AMLODIPINE BESYLATE 10 MG PO TABS
10.0000 mg | ORAL_TABLET | Freq: Every day | ORAL | Status: DC
  Administered 2019-06-27 – 2019-06-28 (×2): 10 mg via ORAL
  Filled 2019-06-27 (×2): qty 1

## 2019-06-27 MED ORDER — METOCLOPRAMIDE HCL 5 MG/ML IJ SOLN: 5.0000 mg | Freq: Three times a day (TID) | INTRAMUSCULAR | Status: DC | PRN

## 2019-06-27 MED ORDER — SCOPOLAMINE 1 MG/3DAYS TD PT72
MEDICATED_PATCH | TRANSDERMAL | Status: AC
  Filled 2019-06-27: qty 1

## 2019-06-27 MED ORDER — ONDANSETRON HCL 4 MG/2ML IJ SOLN
INTRAMUSCULAR | Status: AC
  Filled 2019-06-27: qty 2

## 2019-06-27 MED ORDER — OXYCODONE HCL 5 MG PO TABS
ORAL_TABLET | ORAL | Status: AC
  Administered 2019-06-27: 15:00:00 10 mg via ORAL
  Filled 2019-06-27: qty 2

## 2019-06-27 MED ORDER — TRANEXAMIC ACID 1000 MG/10ML IV SOLN
2000.0000 mg | Freq: Once | INTRAVENOUS | Status: DC
  Filled 2019-06-27: qty 20

## 2019-06-27 MED ORDER — POLYETHYLENE GLYCOL 3350 17 G PO PACK: 17.0000 g | PACK | Freq: Every day | ORAL | Status: DC | PRN

## 2019-06-27 MED ORDER — MENTHOL 3 MG MT LOZG: 1.0000 | LOZENGE | OROMUCOSAL | Status: DC | PRN

## 2019-06-27 MED ORDER — ROCURONIUM BROMIDE 10 MG/ML (PF) SYRINGE
PREFILLED_SYRINGE | INTRAVENOUS | Status: AC
  Filled 2019-06-27: qty 10

## 2019-06-27 MED ORDER — TRANEXAMIC ACID 1000 MG/10ML IV SOLN
INTRAVENOUS | Status: DC | PRN
  Administered 2019-06-27: 2000 mg via TOPICAL

## 2019-06-27 MED ORDER — CHLORHEXIDINE GLUCONATE 4 % EX LIQD: 60.0000 mL | Freq: Once | CUTANEOUS | Status: DC

## 2019-06-27 MED ORDER — MIDAZOLAM HCL 2 MG/2ML IJ SOLN
INTRAMUSCULAR | Status: AC
  Filled 2019-06-27: qty 2

## 2019-06-27 MED ORDER — FENTANYL CITRATE (PF) 100 MCG/2ML IJ SOLN
50.0000 ug | INTRAMUSCULAR | Status: DC
  Administered 2019-06-27: 09:00:00 50 ug via INTRAVENOUS
  Filled 2019-06-27: qty 2

## 2019-06-27 MED ORDER — SODIUM CHLORIDE (PF) 0.9 % IJ SOLN
INTRAMUSCULAR | Status: DC | PRN
  Administered 2019-06-27: 70 mL

## 2019-06-27 MED ORDER — PANTOPRAZOLE SODIUM 40 MG PO TBEC
40.0000 mg | DELAYED_RELEASE_TABLET | Freq: Every day | ORAL | Status: DC
  Administered 2019-06-27 – 2019-06-28 (×2): 40 mg via ORAL
  Filled 2019-06-27 (×2): qty 1

## 2019-06-27 MED ORDER — BUPIVACAINE HCL 0.25 % IJ SOLN
INTRAMUSCULAR | Status: DC | PRN
  Administered 2019-06-27: 30 mL

## 2019-06-27 MED ORDER — METOPROLOL SUCCINATE ER 50 MG PO TB24
50.0000 mg | ORAL_TABLET | Freq: Every day | ORAL | Status: DC
  Administered 2019-06-27: 50 mg via ORAL
  Filled 2019-06-27: qty 1

## 2019-06-27 MED ORDER — LACTATED RINGERS IV BOLUS: 500.0000 mL | Freq: Once | INTRAVENOUS | Status: DC

## 2019-06-27 MED ORDER — BUPIVACAINE LIPOSOME 1.3 % IJ SUSP
INTRAMUSCULAR | Status: DC | PRN
  Administered 2019-06-27: 20 mL

## 2019-06-27 MED ORDER — ACETAMINOPHEN 325 MG PO TABS
325.0000 mg | ORAL_TABLET | Freq: Four times a day (QID) | ORAL | Status: DC | PRN
  Administered 2019-06-27: 23:00:00 650 mg via ORAL
  Filled 2019-06-27: qty 2

## 2019-06-27 MED ORDER — GABAPENTIN 100 MG PO CAPS
100.0000 mg | ORAL_CAPSULE | Freq: Three times a day (TID) | ORAL | Status: DC
  Administered 2019-06-27: 100 mg via ORAL
  Filled 2019-06-27 (×2): qty 1

## 2019-06-27 MED ORDER — MOMETASONE FURO-FORMOTEROL FUM 200-5 MCG/ACT IN AERO
2.0000 | INHALATION_SPRAY | Freq: Two times a day (BID) | RESPIRATORY_TRACT | Status: DC
  Administered 2019-06-28: 2 via RESPIRATORY_TRACT
  Filled 2019-06-27: qty 8.8

## 2019-06-27 MED ORDER — FENTANYL CITRATE (PF) 100 MCG/2ML IJ SOLN
INTRAMUSCULAR | Status: AC
  Filled 2019-06-27: qty 2

## 2019-06-27 MED ORDER — MEPIVACAINE HCL (PF) 2 % IJ SOLN
INTRAMUSCULAR | Status: DC | PRN
  Administered 2019-06-27: 3 mL via EPIDURAL

## 2019-06-27 MED ORDER — MIDAZOLAM HCL 2 MG/2ML IJ SOLN
INTRAMUSCULAR | Status: DC | PRN
  Administered 2019-06-27: 1 mg via INTRAVENOUS

## 2019-06-27 MED ORDER — DEXAMETHASONE SODIUM PHOSPHATE 10 MG/ML IJ SOLN
INTRAMUSCULAR | Status: DC | PRN
  Administered 2019-06-27: 10 mg via INTRAVENOUS

## 2019-06-27 MED ORDER — OXYCODONE HCL 5 MG PO TABS: 5.0000 mg | ORAL_TABLET | ORAL | 0 refills | Status: DC | PRN

## 2019-06-27 MED ORDER — PROMETHAZINE HCL 12.5 MG PO TABS
12.5000 mg | ORAL_TABLET | Freq: Four times a day (QID) | ORAL | Status: DC | PRN
  Filled 2019-06-27: qty 1

## 2019-06-27 MED ORDER — DIPHENHYDRAMINE HCL 12.5 MG/5ML PO ELIX: 12.5000 mg | ORAL_SOLUTION | ORAL | Status: DC | PRN

## 2019-06-27 MED ORDER — PROPOFOL 500 MG/50ML IV EMUL: INTRAVENOUS | Status: DC | PRN

## 2019-06-27 MED ORDER — ONDANSETRON HCL 4 MG/2ML IJ SOLN: 4.0000 mg | Freq: Four times a day (QID) | INTRAMUSCULAR | Status: DC | PRN

## 2019-06-27 MED ORDER — HYDROCHLOROTHIAZIDE 25 MG PO TABS: 25.0000 mg | ORAL_TABLET | Freq: Every day | ORAL | Status: DC | PRN

## 2019-06-27 MED ORDER — MIDAZOLAM HCL 2 MG/2ML IJ SOLN
1.0000 mg | INTRAMUSCULAR | Status: DC
  Administered 2019-06-27: 09:00:00 1 mg via INTRAVENOUS
  Filled 2019-06-27: qty 2

## 2019-06-27 MED ORDER — PHENYLEPHRINE 40 MCG/ML (10ML) SYRINGE FOR IV PUSH (FOR BLOOD PRESSURE SUPPORT)
PREFILLED_SYRINGE | INTRAVENOUS | Status: DC | PRN
  Administered 2019-06-27 (×2): 40 ug via INTRAVENOUS

## 2019-06-27 MED ORDER — PROPOFOL 500 MG/50ML IV EMUL
INTRAVENOUS | Status: DC | PRN
  Administered 2019-06-27: 200 ug/kg/min via INTRAVENOUS

## 2019-06-27 MED ORDER — HYDROMORPHONE HCL 1 MG/ML IJ SOLN
0.5000 mg | INTRAMUSCULAR | Status: DC | PRN
  Administered 2019-06-28: 1 mg via INTRAVENOUS
  Filled 2019-06-27: qty 1

## 2019-06-27 MED ORDER — SODIUM CHLORIDE (PF) 0.9 % IJ SOLN
INTRAMUSCULAR | Status: AC
  Filled 2019-06-27: qty 20

## 2019-06-27 MED ORDER — TOPIRAMATE 100 MG PO TABS
100.0000 mg | ORAL_TABLET | Freq: Two times a day (BID) | ORAL | Status: DC
  Administered 2019-06-27 – 2019-06-28 (×2): 100 mg via ORAL
  Filled 2019-06-27 (×2): qty 1

## 2019-06-27 MED ORDER — BUPIVACAINE HCL 0.25 % IJ SOLN
INTRAMUSCULAR | Status: AC
  Filled 2019-06-27: qty 1

## 2019-06-27 MED ORDER — PHENOL 1.4 % MT LIQD: 1.0000 | OROMUCOSAL | Status: DC | PRN

## 2019-06-27 MED ORDER — METHOCARBAMOL 500 MG IVPB - SIMPLE MED
500.0000 mg | Freq: Four times a day (QID) | INTRAVENOUS | Status: DC | PRN
  Administered 2019-06-27: 14:00:00 500 mg via INTRAVENOUS
  Filled 2019-06-27: qty 50

## 2019-06-27 MED ORDER — PHENYLEPHRINE HCL (PRESSORS) 10 MG/ML IV SOLN
INTRAVENOUS | Status: AC
  Filled 2019-06-27: qty 1

## 2019-06-27 MED ORDER — METOCLOPRAMIDE HCL 5 MG PO TABS: 5.0000 mg | ORAL_TABLET | Freq: Three times a day (TID) | ORAL | Status: DC | PRN

## 2019-06-27 SURGICAL SUPPLY — 52 items
ATTUNE PS FEM RT SZ 7 CEM KNEE (Femur) ×3 IMPLANT
ATTUNE PSRP INSR SZ7 5 KNEE (Insert) ×2 IMPLANT
ATTUNE PSRP INSR SZ7 5MM KNEE (Insert) ×1 IMPLANT
BAG DECANTER FOR FLEXI CONT (MISCELLANEOUS) ×3 IMPLANT
BAG ZIPLOCK 12X15 (MISCELLANEOUS) ×3 IMPLANT
BASE TIBIAL ROT PLAT SZ 5 KNEE (Knees) ×1 IMPLANT
BLADE SAG 18X100X1.27 (BLADE) ×3 IMPLANT
BLADE SAW SGTL 11.0X1.19X90.0M (BLADE) ×3 IMPLANT
BLADE SURG SZ10 CARB STEEL (BLADE) ×6 IMPLANT
BNDG ELASTIC 6X10 VLCR STRL LF (GAUZE/BANDAGES/DRESSINGS) ×3 IMPLANT
BOWL SMART MIX CTS (DISPOSABLE) ×3 IMPLANT
CEMENT HV SMART SET (Cement) ×6 IMPLANT
COVER SURGICAL LIGHT HANDLE (MISCELLANEOUS) ×3 IMPLANT
COVER WAND RF STERILE (DRAPES) IMPLANT
CUFF TOURN SGL QUICK 34 (TOURNIQUET CUFF) ×2
CUFF TRNQT CYL 34X4.125X (TOURNIQUET CUFF) ×1 IMPLANT
DECANTER SPIKE VIAL GLASS SM (MISCELLANEOUS) ×9 IMPLANT
DRAPE U-SHAPE 47X51 STRL (DRAPES) ×3 IMPLANT
DRSG AQUACEL AG ADV 3.5X10 (GAUZE/BANDAGES/DRESSINGS) ×3 IMPLANT
DURAPREP 26ML APPLICATOR (WOUND CARE) ×3 IMPLANT
ELECT REM PT RETURN 15FT ADLT (MISCELLANEOUS) ×3 IMPLANT
GLOVE BIO SURGEON STRL SZ7.5 (GLOVE) ×3 IMPLANT
GLOVE BIO SURGEON STRL SZ8.5 (GLOVE) ×3 IMPLANT
GLOVE BIOGEL PI IND STRL 8 (GLOVE) ×1 IMPLANT
GLOVE BIOGEL PI IND STRL 9 (GLOVE) ×1 IMPLANT
GLOVE BIOGEL PI INDICATOR 8 (GLOVE) ×2
GLOVE BIOGEL PI INDICATOR 9 (GLOVE) ×2
GOWN STRL REUS W/TWL XL LVL3 (GOWN DISPOSABLE) ×6 IMPLANT
HANDPIECE INTERPULSE COAX TIP (DISPOSABLE) ×2
HOOD PEEL AWAY FLYTE STAYCOOL (MISCELLANEOUS) ×9 IMPLANT
KIT TURNOVER KIT A (KITS) IMPLANT
NEEDLE HYPO 21X1.5 SAFETY (NEEDLE) ×6 IMPLANT
NS IRRIG 1000ML POUR BTL (IV SOLUTION) ×3 IMPLANT
PACK ICE MAXI GEL EZY WRAP (MISCELLANEOUS) ×3 IMPLANT
PACK TOTAL KNEE CUSTOM (KITS) ×3 IMPLANT
PATELLA MEDIAL ATTUN 35MM KNEE (Knees) ×3 IMPLANT
PENCIL SMOKE EVACUATOR (MISCELLANEOUS) IMPLANT
PIN DRILL FIX HALF THREAD (BIT) ×3 IMPLANT
PIN STEINMAN FIXATION KNEE (PIN) ×3 IMPLANT
PROTECTOR NERVE ULNAR (MISCELLANEOUS) ×3 IMPLANT
SET HNDPC FAN SPRY TIP SCT (DISPOSABLE) ×1 IMPLANT
SUT VIC AB 1 CTX 36 (SUTURE) ×2
SUT VIC AB 1 CTX36XBRD ANBCTR (SUTURE) ×1 IMPLANT
SUT VIC AB 3-0 CT1 27 (SUTURE) ×6
SUT VIC AB 3-0 CT1 TAPERPNT 27 (SUTURE) ×3 IMPLANT
SYR CONTROL 10ML LL (SYRINGE) ×6 IMPLANT
TIBIAL BASE ROT PLAT SZ 5 KNEE (Knees) ×3 IMPLANT
TRAY CATH 16FR W/PLASTIC CATH (SET/KITS/TRAYS/PACK) ×3 IMPLANT
TRAY FOLEY MTR SLVR 16FR STAT (SET/KITS/TRAYS/PACK) ×3 IMPLANT
WATER STERILE IRR 1000ML POUR (IV SOLUTION) ×6 IMPLANT
WRAP KNEE MAXI GEL POST OP (GAUZE/BANDAGES/DRESSINGS) ×3 IMPLANT
YANKAUER SUCT BULB TIP 10FT TU (MISCELLANEOUS) ×3 IMPLANT

## 2019-06-27 NOTE — Evaluation (Addendum)
Physical Therapy Evaluation Patient Details Name: Ann [name] MRN: [id number] DOB: [birth year] Today's Date: 06/27/2019   History of Present Illness  Patient is 63 y.o. female s/p Rt TKA on 06/27/19 with PMH significant for Stroke, OA, neuropathy, GERD, COPD, CHF, CKD, anxiety, and Lt TKA 03/28/19.    Clinical Impression  Ann [name] is a 63 y.o. female POD 0 s/p Rt TKA. Patient reports independence with mobility for short household distances at baseline. She reports she uses an Transport planner for long distance mobility in the store and use of rollator outside of home. Patient is now limited by functional impairments (see PT problem list below) and requires min guard for transfers and gait with RW. Patient was able to ambulate ~ 50 feet with RW and min guard assist and was limited by dizziness and lightheadedness. Patient reports she has been experiencing dizziness periodically over the last 3 days. She will benefit from continued skilled PT interventions to address impairments and progress towards PLOF. Acute PT will follow to progress mobility and stair training in preparation for safe discharge home.     Follow Up Recommendations Follow surgeon's recommendation for DC plan and follow-up therapies    Equipment Recommendations  Other (comment);None recommended by PT(pt does not think she will qualify for RW as she was given a rollator in the last 5 years.)    Recommendations for Other Services       Precautions / Restrictions Precautions Precautions: Fall Restrictions Weight Bearing Restrictions: No      Mobility  Bed Mobility Overal bed mobility: Needs Assistance       Supine to sit: HOB elevated     General bed mobility comments: pt sitting at EOB at PT arrival  Transfers Overall transfer level: Needs assistance Equipment used: Rolling walker (2 wheeled) Transfers: Sit to/from [facility] Sit to Stand: Min guard Stand pivot transfers: Min guard        General transfer comment: cues for safe hand placement and safe technique for stand with RW, verbal cues and education provided on safety concerns with pt attempting to mobilize without RW. Pt used RW for stand pivot to [facility] with min guard assist.  Ambulation/Gait Ambulation/Gait assistance: Min guard Gait Distance (Feet): 50 Feet Assistive device: Rolling walker (2 wheeled) Gait Pattern/deviations: Step-to pattern;Decreased stride length;Decreased step length - left;Decreased stance time - right Gait velocity: decreased   General Gait Details: pt educated on safe proximity and step pattern within RW for gait, no overt LOB noted. verbal cues for safety to keep RW on ground and roll it. SpO2 monitored and remained 92-96% with gait on RA. Pt became dizzy at end of gait and flush, seated rest provided.  Stairs            Wheelchair Mobility    Modified Rankin (Stroke Patients Only)       Balance Overall balance assessment: Needs assistance Sitting-balance support: Feet supported Sitting balance-Leahy Scale: Good     Standing balance support: During functional activity;Bilateral upper extremity supported Standing balance-Leahy Scale: Poor          Pertinent Vitals/Pain Pain Assessment: Faces Faces Pain Scale: Hurts a little bit Pain Location: Rt knee Pain Descriptors / Indicators: Aching;Sore Pain Intervention(s): Limited activity within patient's tolerance    Home Living Family/patient expects to be discharged to:: Private residence Living Arrangements: Alone Available Help at Discharge: Family(pt's daughter lives with her some, she is [place] for vacation tomorrow on 06/28/19 and does not return until 07/02/19.)  Type of Home: House Home Access: Stairs to enter Entrance Stairs-Rails: None Entrance Stairs-Number of Steps: 1 Home Layout: One level Home Equipment: [place] - 4 wheels;Cane - single point      Prior Function Level of Independence: Independent                Hand Dominance   Dominant Hand: Right    Extremity/Trunk Assessment   Upper Extremity Assessment Upper Extremity Assessment: Overall WFL for tasks assessed    Lower Extremity Assessment Lower Extremity Assessment: RLE deficits/detail RLE Deficits / Details: pt with good quad activation for LAQ RLE Sensation: WNL RLE Coordination: WNL    Cervical / Trunk Assessment Cervical / Trunk Assessment: Normal  Communication   Communication: No difficulties  Cognition Arousal/Alertness: Awake/alert Behavior During Therapy: WFL for tasks assessed/performed Overall Cognitive Status: Within Functional Limits for tasks assessed         General Comments      Exercises     Assessment/Plan    PT Assessment Patient needs continued PT services  PT Problem List Decreased strength;Decreased activity tolerance;Decreased balance;Decreased range of motion;Decreased knowledge of use of DME;Decreased mobility       PT Treatment Interventions DME instruction;Balance training;Functional mobility training;Patient/family education;Gait training;Therapeutic activities;Therapeutic exercise;Stair training    PT Goals (Current goals can be found in the Care Plan section)  Acute Rehab PT Goals Patient Stated Goal: to improve walking for exercise PT Goal Formulation: With patient Time For Goal Achievement: 07/04/19 Potential to Achieve Goals: Good    Frequency 7X/week    AM-PAC PT "6 Clicks" Mobility  Outcome Measure Help needed turning from your back to your side while in a flat bed without using bedrails?: A Little Help needed moving from lying on your back to sitting on the side of a flat bed without using bedrails?: A Little Help needed moving to and from a bed to a chair (including a wheelchair)?: A Little Help needed standing up from a chair using your arms (e.g., wheelchair or bedside chair)?: A Little Help needed to walk in hospital room?: A Little Help needed climbing  3-5 steps with a railing? : A Little 6 Click Score: 18    End of Session Equipment Utilized During Treatment: Gait belt Activity Tolerance: Treatment limited secondary to medical complications (Comment)(pt dizzy, lightheaded) Patient left: with call bell/phone within reach;in chair Nurse Communication: Mobility status PT Visit Diagnosis: Muscle weakness (generalized) (M62.81);Difficulty in walking, not elsewhere classified (R26.2)    Time: [id number] PT Time Calculation (min) (ACUTE ONLY): 30 min   Charges:   PT Evaluation $PT Eval Low Complexity: 1 Low        Kipp Brood, PT, DPT Physical Therapist with [facility]  06/27/2019 6:55 PM

## 2019-06-27 NOTE — Progress Notes (Signed)
Assisted Dr. Hollis with right, ultrasound guided, adductor canal block. Side rails up, monitors on throughout procedure. See vital signs in flow sheet. Tolerated Procedure well.  

## 2019-06-27 NOTE — Progress Notes (Addendum)
Physical Therapy Treatment Patient Details Name: Ann [name] MRN: [id number] DOB: [birth year] Today's Date: 06/27/2019    History of Present Illness Patient is 63 y.o. female s/p Rt TKA on 06/27/19 with PMH significant for Stroke, OA, neuropathy, GERD, COPD, CHF, CKD, anxiety, and Lt TKA 03/28/19.    PT Comments    Patient continues to require min guard for transfers and gait with RW and verbal cues for safety. She continues to report dizziness/wavey sensation as she mobilizes and required seated rest break after ~60' of gait. Pt was instructed in exercises to facilitate ROM and circulation. She will benefit from additional skilled PT interventions to progress mobility and stair training for safe discharge home. Acute PT will follow and progress as able.    Follow Up Recommendations  Follow surgeon's recommendation for DC plan and follow-up therapies     Equipment Recommendations  Other (comment);None recommended by PT(pt does not think she will qualify for RW as she was given a rollator in the last 5 years.)    Recommendations for Other Services       Precautions / Restrictions Precautions Precautions: Fall Restrictions Weight Bearing Restrictions: No    Mobility  Bed Mobility Overal bed mobility: Needs Assistance       Supine to sit: HOB elevated     General bed mobility comments: Pt in recliner at start of session  Transfers Overall transfer level: Needs assistance Equipment used: Rolling walker (2 wheeled) Transfers: Sit to/from [facility] Sit to Stand: Min guard Stand pivot transfers: Min guard       General transfer comment: verbal cues required for safety as pt has tendency to attempt to move quickly or before teh RW is in safe place. min guard for safety provided for transfers. no overt LOB ntoed or difficulty with initiating power up.  Ambulation/Gait Ambulation/Gait assistance: Min guard Gait Distance (Feet): 60 Feet Assistive device:  Rolling walker (2 wheeled) Gait Pattern/deviations: Step-to pattern;Decreased stride length;Decreased step length - left;Decreased stance time - right Gait velocity: decreased   General Gait Details: verbal cues for safe proximity and step pattern in RW. pt continued to report dizziness/waviness with mobilizing and seated rest was provided.   Stairs             Wheelchair Mobility    Modified Rankin (Stroke Patients Only)       Balance Overall balance assessment: Needs assistance Sitting-balance support: Feet supported Sitting balance-Leahy Scale: Good     Standing balance support: During functional activity;Bilateral upper extremity supported Standing balance-Leahy Scale: Poor           Cognition Arousal/Alertness: Awake/alert Behavior During Therapy: WFL for tasks assessed/performed Overall Cognitive Status: Within Functional Limits for tasks assessed        General Comments: pt reporting feeling "woozy" and "wavey" during mobility. she reports feeling like she is not able to focus and like she is having trouble with her memory.      Exercises Total Joint Exercises Ankle Circles/Pumps: AROM;Seated;10 reps;Both Quad Sets: AROM;10 reps;Seated;Right Heel Slides: AAROM;10 reps;Seated;Right    General Comments        Pertinent Vitals/Pain Pain Assessment: Faces Faces Pain Scale: Hurts little more Pain Location: Rt knee Pain Descriptors / Indicators: Aching;Sore Pain Intervention(s): Monitored during session    Home Living Family/patient expects to be discharged to:: Private residence Living Arrangements: Alone Available Help at Discharge: Family(pt's daughter lives with her some, she is [place] for vacation tomorrow on 06/28/19 and does not return until  07/02/19.) Type of Home: House Home Access: Stairs to enter Entrance Stairs-Rails: None Home Layout: One level Home Equipment: Environmental consultant - 4 wheels;Cane - single point      Prior Function Level of  Independence: Independent          PT Goals (current goals can now be found in the care plan section) Acute Rehab PT Goals Patient Stated Goal: to improve walking for exercise PT Goal Formulation: With patient Time For Goal Achievement: 07/04/19 Potential to Achieve Goals: Good Progress towards PT goals: Progressing toward goals    Frequency    7X/week      PT Plan Current plan remains appropriate       AM-PAC PT "6 Clicks" Mobility   Outcome Measure  Help needed turning from your back to your side while in a flat bed without using bedrails?: A Little Help needed moving from lying on your back to sitting on the side of a flat bed without using bedrails?: A Little Help needed moving to and from a bed to a chair (including a wheelchair)?: A Little Help needed standing up from a chair using your arms (e.g., wheelchair or bedside chair)?: A Little Help needed to walk in hospital room?: A Little Help needed climbing 3-5 steps with a railing? : A Little 6 Click Score: 18    End of Session Equipment Utilized During Treatment: Gait belt Activity Tolerance: Treatment limited secondary to medical complications (Comment)(pt dizzy, lightheaded) Patient left: with call bell/phone within reach;in chair Nurse Communication: Mobility status PT Visit Diagnosis: Muscle weakness (generalized) (M62.81);Difficulty in walking, not elsewhere classified (R26.2)     Time: [id number] PT Time Calculation (min) (ACUTE ONLY): 25 min  Charges:                        Gwynneth Albright, PT, DPT Physical Therapist with [facility]  06/27/2019 8:05 PM

## 2019-06-27 NOTE — Transfer of Care (Signed)
Immediate Anesthesia Transfer of Care Note  Patient: MALA BONKOSKI  Procedure(s) Performed: RIGHT TOTAL KNEE ARTHROPLASTY (Right Knee)  Patient Location: PACU  Anesthesia Type:Spinal  Level of Consciousness: awake and alert   Airway & Oxygen Therapy: Patient Spontanous Breathing and Patient connected to face mask oxygen  Post-op Assessment: Report given to RN and Post -op Vital signs reviewed and stable  Post vital signs: Reviewed and stable  Last Vitals:  Vitals Value Taken Time  BP 129/88 06/27/19 1203  Temp    Pulse 69 06/27/19 1204  Resp 18 06/27/19 1204  SpO2 100 % 06/27/19 1204  Vitals shown include unvalidated device data.  Last Pain:  Vitals:   06/27/19 0808  TempSrc:   PainSc: 0-No pain         Complications: No apparent anesthesia complications

## 2019-06-27 NOTE — Care Plan (Signed)
Ortho Bundle Case Management Note  Patient Details  Name: LEEBA CHILLEMI MRN: [id number] Date of Birth: [birth year]  Home health agency has changed to [facility]. [facility] not able to staff request.            DME Arranged:    DME Agency:     HH Arranged:  PT [place]:  [facility] (now [facility])  Additional Comments: Please contact me with any questions of if this plan should need to change.  Ladell Heads,  [facility]  [phone] 06/27/2019, 9:37 AM

## 2019-06-27 NOTE — Anesthesia Procedure Notes (Signed)
Date/Time: 06/27/2019 11:45 AM Performed by: Cynda Familia, CRNA Oxygen Delivery Method: Simple face mask Placement Confirmation: breath sounds checked- equal and bilateral and positive ETCO2 Dental Injury: Teeth and Oropharynx as per pre-operative assessment

## 2019-06-27 NOTE — Anesthesia Procedure Notes (Deleted)
Spinal

## 2019-06-27 NOTE — Interval H&P Note (Signed)
History and Physical Interval Note:  06/27/2019 9:18 AM  Ann [name]  has presented today for surgery, with the diagnosis of RIGHT KNEE OSTEOARTHRITIS.  The various methods of treatment have been discussed with the patient and family. After consideration of risks, benefits and other options for treatment, the patient has consented to  Procedure(s): RIGHT TOTAL KNEE ARTHROPLASTY (Right) as a surgical intervention.  The patient's history has been reviewed, patient examined, no change in status, stable for surgery.  I have reviewed the patient's chart and labs.  Questions were answered to the patient's satisfaction.     [name]

## 2019-06-27 NOTE — Op Note (Signed)
PATIENT ID:      Ann [name]  MRN:     [id number] DOB/AGE:    61-08-1955 / 61 y.o.       OPERATIVE REPORT   DATE OF PROCEDURE:  06/27/2019      PREOPERATIVE DIAGNOSIS:   RIGHT KNEE OSTEOARTHRITIS      Estimated body mass index is 36.95 kg/m as calculated from the following:   Height as of this encounter: 5\' 2"  (1.575 m).   Weight as of this encounter: 91.6 kg.                                                       POSTOPERATIVE DIAGNOSIS:   RIGHT KNEE OSTEOARTHRITIS                                                                       PROCEDURE:  Procedure(s): RIGHT TOTAL KNEE ARTHROPLASTY Using DepuyAttune RP implants #7R Femur, #5Tibia, 5 mm Attune RP bearing, 35 Patella    SURGEON: [name]  ASSISTANT:   Kerry Hough. [employer]   (Present and scrubbed throughout the case, critical for assistance with exposure, retraction, instrumentation, and closure.)        ANESTHESIA: Spinal, GLMA, 20cc Exparel, 50cc 0.25% Marcaine EBL: 300 cc FLUID REPLACEMENT: 1500 cc crystaloid TOURNIQUET: DRAINS: None TRANEXAMIC ACID: 1gm IV, 2gm topical COMPLICATIONS:  None         INDICATIONS FOR PROCEDURE: The patient has  RIGHT KNEE OSTEOARTHRITIS, Var deformities, XR shows bone on bone arthritis, lateral subluxation of tibia. Patient has failed all conservative measures including anti-inflammatory medicines, narcotics, attempts at exercise and weight loss, cortisone injections and viscosupplementation.  Risks and benefits of surgery have been discussed, questions answered.   DESCRIPTION OF PROCEDURE: The patient identified by armband, received  IV antibiotics, in the holding area at [facility]. Patient taken to the operating room, appropriate anesthetic monitors were attached, and Spinal anesthesia was attempted, but did not take, GLMA induced. IV Tranexamic acid was given.Tourniquet applied high to the operative thigh. Lateral post and foot positioner applied to the table, the lower  extremity was then prepped and draped in usual sterile fashion from the toes to the tourniquet. Time-out procedure was performed. The skin and subcutaneous tissue along the incision was injected with 20 cc of a mixture of Exparel and Marcaine solution, using a 20-gauge by 1-1/2 inch needle. We began the operation, with the knee flexed 130 degrees, by making the anterior midline incision starting at handbreadth above the patella going over the patella 1 cm medial to and 4 cm distal to the tibial tubercle. Small bleeders in the skin and the subcutaneous tissue identified and cauterized. Transverse retinaculum was incised and reflected medially and a medial parapatellar arthrotomy was accomplished. the patella was everted and theprepatellar fat pad resected. The superficial medial collateral ligament was then elevated from anterior to posterior along the proximal flare of the tibia and anterior half of the menisci resected. The knee was hyperflexed exposing bone on bone arthritis. Peripheral and notch osteophytes as well as the  cruciate ligaments were then resected. We continued to work our way around posteriorly along the proximal tibia, and externally rotated the tibia subluxing it out from underneath the femur. A McHale PCL retractor was placed through the notch and a lateral Hohmann retractor placed, and we then entered the proximal tibia in line with the Depuy starter drill in line with the axis of the tibia followed by an intramedullary guide rod and 0-degree posterior slope cutting guide. The tibial cutting guide, 4 degree posterior sloped, was pinned into place allowing resection of 8 mm of bone medially and 15 mm of bone laterally. Satisfied with the tibial resection, we then entered the distal femur 2 mm anterior to the PCL origin with the intramedullary guide rod and applied the distal femoral cutting guide set at 9 mm, with 5 degrees of valgus. This was pinned along the epicondylar axis. At this point, the  distal femoral cut was accomplished without difficulty. We then sized for a #7R femoral component and pinned the guide in 3 degrees of external rotation. The chamfer cutting guide was pinned into place. The anterior, posterior, and chamfer cuts were accomplished without difficulty followed by the Attune RP box cutting guide and the box cut. We also removed posterior osteophytes from the posterior femoral condyles. The posterior capsule was injected with Exparel solution. The knee was brought into full extension. We checked our extension gap and fit a 5 mm bearing. Distracting in extension with a lamina spreader,  bleeders in the posterior capsule, Posterior medial and posterior lateral gutter were cauterized.  The transexamic acid-soaked sponge was then placed in the gap of the knee in extension. The knee was flexed 30. The posterior patella cut was accomplished with the 9.5 mm Attune cutting guide, sized for a 19mm dome, and the fixation pegs drilled.The knee was then once again hyperflexed exposing the proximal tibia. We sized for a # 5 tibial base plate, applied the smokestack and the conical reamer followed by the the Delta fin keel punch. We then hammered into place the Attune RP trial femoral component, drilled the lugs, inserted a  5 mm trial bearing, trial patellar button, and took the knee through range of motion from 0-130 degrees. Medial and lateral ligamentous stability was checked. No thumb pressure was required for patellar Tracking. The tourniquet was not used. All trial components were removed, mating surfaces irrigated with pulse lavage, and dried with suction and sponges. 10 cc of the Exparel solution was applied to the cancellus bone of the patella distal femur and proximal tibia.  After waiting 30 seconds, the bony surfaces were again, dried with sponges. A double batch of DePuy HV cement was mixed and applied to all bony metallic mating surfaces except for the posterior condyles of the femur  itself. In order, we hammered into place the tibial tray and removed excess cement, the femoral component and removed excess cement. The final Attune RP bearing was inserted, and the knee brought to full extension with compression. The patellar button was clamped into place, and excess cement removed. The knee was held at 30 flexion with compression, while the cement cured. The wound was irrigated out with normal saline solution pulse lavage. The rest of the Exparel was injected into the parapatellar arthrotomy, subcutaneous tissues, and periosteal tissues. The parapatellar arthrotomy was closed with running #1 Vicryl suture. The subcutaneous tissue with 0 and 2-0 undyed Vicryl suture, and the skin with running 3-0 SQ vicryl. An Aquacil and Ace wrap were applied. The  patient was taken to recovery room without difficulty.   [name] 06/27/2019, 11:50 AM

## 2019-06-27 NOTE — Anesthesia Procedure Notes (Signed)
Procedure Name: LMA Insertion Date/Time: 06/27/2019 10:28 AM Performed by: Cynda Familia, CRNA Pre-anesthesia Checklist: Patient identified, Emergency Drugs available, Suction available and Patient being monitored Patient Re-evaluated:Patient Re-evaluated prior to induction Oxygen Delivery Method: Circle System Utilized Preoxygenation: Pre-oxygenation with 100% oxygen Induction Type: IV induction Ventilation: Mask ventilation without difficulty LMA: LMA inserted and LMA with gastric port inserted LMA Size: 4.0 Number of attempts: 1 Placement Confirmation: positive ETCO2 Tube secured with: Tape Dental Injury: Teeth and Oropharynx as per pre-operative assessment  Comments: Pt moving with local injection-- AW obstruction present with increased sedation-- Holl[place] paged--- LMA inserted atraumatic-- teeth and mouth as preop-- bilat BS

## 2019-06-27 NOTE — Discharge Instructions (Signed)

## 2019-06-27 NOTE — Anesthesia Procedure Notes (Signed)
Date/Time: 06/27/2019 9:57 AM Performed by: Cynda Familia, CRNA Pre-anesthesia Checklist: Patient identified, Emergency Drugs available, Suction available, Patient being monitored and Timeout performed Patient Re-evaluated:Patient Re-evaluated prior to induction Oxygen Delivery Method: Simple face mask Placement Confirmation: positive ETCO2 and breath sounds checked- equal and bilateral Dental Injury: Teeth and Oropharynx as per pre-operative assessment

## 2019-06-27 NOTE — Anesthesia Preprocedure Evaluation (Addendum)
Anesthesia Evaluation  Patient identified by MRN, date of birth, ID band Patient awake    Reviewed: Allergy & Precautions, NPO status , Patient's Chart, lab work & pertinent test results  Airway Mallampati: III  TM Distance: >3 FB Neck ROM: Full    Dental  (+) Teeth Intact, Dental Advisory Given   Pulmonary sleep apnea and Continuous Positive Airway Pressure Ventilation , COPD,    breath sounds clear to auscultation       Cardiovascular hypertension, Pt. on medications and Pt. on home beta blockers +CHF   Rhythm:Regular Rate:Normal     Neuro/Psych  Headaches, Anxiety  Neuromuscular disease CVA    GI/Hepatic Neg liver ROS, hiatal hernia, GERD  Medicated,  Endo/Other  negative endocrine ROS  Renal/GU Renal InsufficiencyRenal disease     Musculoskeletal  (+) Arthritis ,   Abdominal Normal abdominal exam  (+)   Peds  Hematology negative hematology ROS (+)   Anesthesia Other Findings   Reproductive/Obstetrics                            Echo: Left ventricle: The cavity size was normal. Wall thickness was   increased in a pattern of mild LVH. Systolic function was mildly   reduced. The estimated ejection fraction was in the range of 45%   to 50%. Diffuse hypokinesis. Doppler parameters are consistent   with abnormal left ventricular relaxation (grade 1 diastolic   dysfunction).   Anesthesia Physical Anesthesia Plan  ASA: III  Anesthesia Plan: Spinal   Post-op Pain Management:  Regional for Post-op pain   Induction: Intravenous  PONV Risk Score and Plan: 4 or greater and Ondansetron, Dexamethasone, Midazolam and Scopolamine patch - Pre-op  Airway Management Planned: Natural Airway and Simple Face Mask  Additional Equipment: None  Intra-op Plan:   Post-operative Plan: Extubation in OR  Informed Consent: I have reviewed the patients History and Physical, chart, labs and discussed  the procedure including the risks, benefits and alternatives for the proposed anesthesia with the patient or authorized representative who has indicated his/her understanding and acceptance.     Dental advisory given  Plan Discussed with: CRNA  Anesthesia Plan Comments: (Pt prefers spinal after surgeon conversation.   Pt is less than ideal candidate for outpatient TKA, concern expressed to surgeon. )     Anesthesia Quick Evaluation

## 2019-06-27 NOTE — Anesthesia Procedure Notes (Addendum)
Spinal  Start time: 06/27/2019 10:05 AM End time: 06/27/2019 10:07 AM Staffing Performed: anesthesiologist  Anesthesiologist: Effie Berkshire, MD Preanesthetic Checklist Completed: patient identified, IV checked, site marked, risks and benefits discussed, surgical consent, monitors and equipment checked, pre-op evaluation and timeout performed Spinal Block Patient position: sitting Prep: DuraPrep and site prepped and draped Location: L3-4 Injection technique: single-shot Needle Needle type: Pencan  Needle gauge: 24 G Needle length: 10 cm Needle insertion depth: 10 cm Additional Notes Patient tolerated well. No immediate complications.

## 2019-06-28 ENCOUNTER — Encounter: Payer: Self-pay | Admitting: *Deleted

## 2019-06-28 DIAGNOSIS — M1711 Unilateral primary osteoarthritis, right knee: Secondary | ICD-10-CM | POA: Diagnosis not present

## 2019-06-28 LAB — CBC
HCT: 37.2 % (ref 36.0–46.0)
Hemoglobin: 11.6 g/dL — ABNORMAL LOW (ref 12.0–15.0)
MCH: 32 pg (ref 26.0–34.0)
MCHC: 31.2 g/dL (ref 30.0–36.0)
MCV: 102.5 fL — ABNORMAL HIGH (ref 80.0–100.0)
Platelets: 273 10*3/uL (ref 150–400)
RBC: 3.63 MIL/uL — ABNORMAL LOW (ref 3.87–5.11)
RDW: 13.7 % (ref 11.5–15.5)
WBC: 10 10*3/uL (ref 4.0–10.5)
nRBC: 0 % (ref 0.0–0.2)

## 2019-06-28 LAB — BASIC METABOLIC PANEL
Anion gap: 9 (ref 5–15)
BUN: 24 mg/dL — ABNORMAL HIGH (ref 8–23)
CO2: 23 mmol/L (ref 22–32)
Calcium: 9.2 mg/dL (ref 8.9–10.3)
Chloride: 105 mmol/L (ref 98–111)
Creatinine, Ser: 1.7 mg/dL — ABNORMAL HIGH (ref 0.44–1.00)
GFR calc Af Amer: 37 mL/min — ABNORMAL LOW (ref >60)
GFR calc non Af Amer: 32 mL/min — ABNORMAL LOW (ref >60)
Glucose, Bld: 117 mg/dL — ABNORMAL HIGH (ref 70–99)
Potassium: 5.6 mmol/L — ABNORMAL HIGH (ref 3.5–5.1)
Sodium: 137 mmol/L (ref 135–145)

## 2019-06-28 NOTE — Anesthesia Postprocedure Evaluation (Signed)
Anesthesia Post Note  Patient: EVIANNA SPOO  Procedure(s) Performed: RIGHT TOTAL KNEE ARTHROPLASTY (Right Knee)     Patient location during evaluation: PACU Anesthesia Type: General and Regional Level of consciousness: awake and alert Pain management: pain level controlled Vital Signs Assessment: post-procedure vital signs reviewed and stable Respiratory status: spontaneous breathing, nonlabored ventilation, respiratory function stable and patient connected to nasal cannula oxygen Cardiovascular status: blood pressure returned to baseline and stable Postop Assessment: no apparent nausea or vomiting Anesthetic complications: no    Last Vitals:  Vitals:   06/28/19 0822 06/28/19 0927  BP:  128/68  Pulse:  (!) 53  Resp:  16  Temp:    SpO2: 92% 93%    Last Pain:  Vitals:   06/28/19 1211  TempSrc:   PainSc: 7                  Lalania Haseman

## 2019-06-28 NOTE — Progress Notes (Signed)
Physical Therapy Treatment Patient Details Name: Ann [name] MRN: [id number] DOB: 12/04/1955 Today's Date: 06/28/2019    History of Present Illness Patient is 63 y.o. female s/p Rt TKA on 06/27/19 with PMH significant for Stroke, OA, neuropathy, GERD, COPD, CHF, CKD, anxiety, and Lt TKA 03/28/19.    PT Comments    POD # 1 am session Assisted with amb and TE's.  General transfer comment: good safety cognition and use of hands to steady self.  General Gait Details: good safety cognition and functional speed.  No dizziness this session.  Pt feeling "much better".  Then returned to room to perform some TE's following HEP handout.  Instructed on proper tech, freq as well as use of ICE.  Addressed all mobility questions, discussed appropriate activity, educated on use of ICE.  Pt ready for D/C to home.   Follow Up Recommendations  Follow surgeon's recommendation for DC plan and follow-up therapies     Equipment Recommendations  None recommended by PT    Recommendations for Other Services       Precautions / Restrictions Precautions Precautions: Fall Restrictions Weight Bearing Restrictions: No Other Position/Activity Restrictions: WBAT    Mobility  Bed Mobility               General bed mobility comments: Pt OOB in bathroom  Transfers Overall transfer level: Needs assistance Equipment used: Rolling walker (2 wheeled) Transfers: Sit to/from [employer] Transfers Sit to Stand: Modified independent (Device/Increase time) Stand pivot transfers: Modified independent (Device/Increase time)       General transfer comment: good safety cognition and use of hands to steady self  Ambulation/Gait Ambulation/Gait assistance: Supervision Gait Distance (Feet): 75 Feet Assistive device: Rolling walker (2 wheeled) Gait Pattern/deviations: Step-to pattern;Decreased stride length;Decreased step length - left;Decreased stance time - right Gait velocity: WFL   General Gait  Details: good safety cognition and functional speed.  No dizziness this session.  Pt feeling "much better"   Stairs             Wheelchair Mobility    Modified Rankin (Stroke Patients Only)       Balance                                            Cognition Arousal/Alertness: Awake/alert Behavior During Therapy: WFL for tasks assessed/performed Overall Cognitive Status: Within Functional Limits for tasks assessed                                 General Comments: pt has had "other knee" replaced and very knowledgable      Exercises   Total Knee Replacement TE's 10 reps B LE ankle pumps 10 reps towel squeezes 10 reps knee presses 10 reps heel slides  10 reps SAQ's 10 reps SLR's 10 reps ABD Followed by ICE     General Comments        Pertinent Vitals/Pain Pain Assessment: 0-10 Pain Score: 8  Pain Location: Rt knee Pain Descriptors / Indicators: Aching;Sore;Operative site guarding;Tightness Pain Intervention(s): Monitored during session;Premedicated before session;Repositioned;Ice applied    Home Living                      Prior Function            PT Goals (current goals can  now be found in the care plan section) Progress towards PT goals: Progressing toward goals    Frequency    7X/week      PT Plan Current plan remains appropriate    Co-evaluation              AM-PAC PT "6 Clicks" Mobility   Outcome Measure  Help needed turning from your back to your side while in a flat bed without using bedrails?: None Help needed moving from lying on your back to sitting on the side of a flat bed without using bedrails?: None Help needed moving to and from a bed to a chair (including a wheelchair)?: None Help needed standing up from a chair using your arms (e.g., wheelchair or bedside chair)?: None Help needed to walk in hospital room?: None Help needed climbing 3-5 steps with a railing? : A Little 6  Click Score: 23    End of Session Equipment Utilized During Treatment: Gait belt Activity Tolerance: Treatment limited secondary to medical complications (Comment) Patient left: with call bell/phone within reach;in chair Nurse Communication: Mobility status(pt ready for D/C to home) PT Visit Diagnosis: Muscle weakness (generalized) (M62.81);Difficulty in walking, not elsewhere classified (R26.2)     Time: 1033-1100 PT Time Calculation (min) (ACUTE ONLY): 27 min  Charges:  $Gait Training: 8-22 mins $Therapeutic Exercise: 8-22 mins                     Rica Koyanagi  PTA Acute  Rehabilitation Services Pager      [phone] Office      [phone]

## 2019-06-28 NOTE — Progress Notes (Signed)
PATIENT ID: Ann [name]  MRN: [id number]  DOB/AGE:  [birth year] / 63 y.o.  1 Day Post-Op Procedure(s) (LRB): RIGHT TOTAL KNEE ARTHROPLASTY (Right)    PROGRESS NOTE Subjective: Patient is alert, oriented, no Nausea, no Vomiting, yes passing gas. Taking PO well. Denies SOB, Chest or Calf Pain. Using Incentive Spirometer, PAS in place. Ambulate I observed the patient ambulating easily in the room she got in and out of bed without any difficulty and demonstrated a range of motion from 0-100, Patient reports pain as 1/10 .    Objective: Vital signs in last 24 hours: Vitals:   06/27/19 2243 06/27/19 2341 06/28/19 0015 06/28/19 0404  BP: 116/66 129/74 125/74 (Abnormal) 141/86  Pulse: (Abnormal) 56 (Abnormal) 55 (Abnormal) 53 (Abnormal) 58  Resp: 16 16 18 18   Temp: 98 F (36.7 C) 98.7 F (37.1 C) 98 F (36.7 C) (Abnormal) 97.5 F (36.4 C)  TempSrc: Oral Oral Oral Oral  SpO2: 97% 97% 97% 94%  Weight:      Height:          Intake/Output from previous day: I/O last 3 completed shifts: In: 2280 [P.O.:680; I.V.:1600] Out: 625 [Urine:325; Blood:300]   Intake/Output this shift: No intake/output data recorded.   LABORATORY DATA: Recent Labs    06/28/19 0506 06/28/19 0607  WBC  --  10.0  HGB  --  11.6*  HCT  --  37.2  PLT  --  273  NA 137  --   K 5.6*  --   CL 105  --   CO2 23  --   BUN 24*  --   CREATININE 1.70*  --   GLUCOSE 117*  --   CALCIUM 9.2  --     Examination: Neurologically intact ABD soft Neurovascular intact Sensation intact distally Intact pulses distally Dorsiflexion/Plantar flexion intact Incision: dressing C/D/I No cellulitis present Compartment soft}  Assessment:   1 Day Post-Op Procedure(s) (LRB): RIGHT TOTAL KNEE ARTHROPLASTY (Right) ADDITIONAL DIAGNOSIS: Expected Acute Blood Loss Anemia, hx kidney stones  Patient's anticipated LOS is less than 2 midnights, meeting these requirements: - Younger than 76 - Lives within 1 hour of care - Has a  competent adult at home to recover with post-op recover - NO history of  - Chronic pain requiring opiods  - Diabetes  - Coronary Artery Disease  - Heart failure  - Heart attack  - Stroke  - DVT/VTE  - Cardiac arrhythmia  - Respiratory Failure/COPD  - Renal failure  - Anemia  - Advanced Liver disease       Plan: PT/OT WBAT, AROM and PROM  DVT Prophylaxis:  SCDx72hrs, ASA 81 mg BID x 2 weeks DISCHARGE PLAN: Home, today DISCHARGE NEEDS: HHPT, Walker and 3-in-1 comode seat     Ann [name] 06/28/2019, 7:43 AM Patient ID: Ann [name], female   DOB: June 26, 1956, 63 y.o.   MRN: [id number]

## 2019-06-28 NOTE — TOC Initial Note (Signed)
Transition of Care [facility]) - Initial/Assessment Note    Patient Details  Name: Ann [name] MRN: [id number] Date of Birth: September 27, 1955  [facility] of [facility]) CM/SW Contact:    Leeroy Cha, RN Phone Number: 06/28/2019, 11:19 AM  Clinical Narrative:                 Delane Ginger for hhc pt  Expected Discharge Plan: [place] Barriers to Discharge: No Barriers Identified   Patient Goals and CMS Choice   CMS Medicare.gov Compare Post Acute Care list provided to:: Patient Choice offered to / list presented to : Patient  Expected Discharge Plan and Services Expected Discharge Plan: [place]   Discharge Planning Services: CM Consult Post Acute Care Choice: Home Health, Durable Medical Equipment Living arrangements for the past 2 months: [place] Expected Discharge Date: 06/27/19               DME Arranged: 3-N-1, Walker rolling DME Agency: [employer] Date DME Agency Contacted: 06/28/19 Time DME Agency Contacted: 0900 Representative spoke with at DME Agency: Ovid Curd HH Arranged: PT [place]: [facility] (now [facility]) Date [place]: 06/28/19 Time [place]: 1119 Representative spoke with at [place]: [place] Arrangements/Services Living arrangements for the past 2 months: [place] with:: Spouse Patient language and need for interpreter reviewed:: No Do you feel safe going back to the place where you live?: Yes      Need for Family Participation in Patient Care: Yes (Comment) Care giver support system in place?: Yes (comment)   Criminal Activity/Legal Involvement Pertinent to Current Situation/Hospitalization: No - Comment as needed  Activities of Daily Living Home Assistive Devices/Equipment: Cane (specify quad or straight) ADL Screening (condition at time of admission) Patient's cognitive ability adequate to safely complete daily activities?: Yes Is the patient  deaf or have difficulty hearing?: No Does the patient have difficulty seeing, even when wearing glasses/contacts?: No Does the patient have difficulty concentrating, remembering, or making decisions?: No Patient able to express need for assistance with ADLs?: Yes Does the patient have difficulty dressing or bathing?: No Independently performs ADLs?: Yes (appropriate for developmental age) Does the patient have difficulty walking or climbing stairs?: No Weakness of Legs: Both Weakness of Arms/Hands: None  Permission Sought/Granted                  Emotional Assessment Appearance:: Appears stated age     Orientation: : Oriented to Self, Oriented to  Time, Oriented to Situation Alcohol / Substance Use: Not Applicable Psych Involvement: No (comment)  Admission diagnosis:  S/P TKR (total knee replacement), right [Z96.651] Total knee replacement status, right [Z96.651] Patient Active Problem List   Diagnosis Date Noted  . S/P TKR (total knee replacement), right 06/27/2019  . Total knee replacement status, right 06/27/2019  . Hydronephrosis, right 05/08/2019  . S/P total knee arthroplasty, left 03/28/2019  . Degenerative arthritis of left knee 03/18/2019  . Rash 08/10/2018  . Pruritic rash 08/09/2018  . Headache 08/09/2018  . Osteoarthritis of right knee 08/28/2017  . Abnormal CT scan, colon   . HLD (hyperlipidemia) 06/03/2017  . Stroke (cerebrum) ([place]) 06/03/2017  . GERD (gastroesophageal reflux disease) 06/03/2017  . Anxiety 06/03/2017  . Chronic combined systolic (congestive) and diastolic (congestive) heart failure ([place]) 06/03/2017  . Hypokalemia 06/03/2017  . Diverticulitis large intestine 06/03/2017  . Lower abdominal pain 06/03/2017  . Non-intractable vomiting with  nausea   . Abnormal CT of the abdomen   . Generalized abdominal pain   . LFT elevation   . Diverticulitis of colon 05/18/2017  . Symptomatic cholelithiasis 02/10/2017  . Hypoxia 01/11/2017  . Dyspnea  01/11/2017  . Dysphagia 04/04/2015  . History of esophageal stricture 04/04/2015  . Morbid (severe) obesity due to excess calories ([place]) 04/01/2015  . ACE-inhibitor cough 03/12/2015  . Upper airway cough syndrome 03/12/2015  . Laryngopharyngeal reflux (LPR) 03/12/2015  . Elevated lipids 07/18/2014  . Chest pain 07/18/2014  . Internal hemorrhoids 04/25/2014  . Ureteral stone with hydronephrosis 04/22/2014  . Post-op pain 04/21/2014  . Rectocele, grade 3 02/20/2014  . Female rectocele without uterine prolapse 08/04/2013  . Unspecified constipation 07/23/2013  . Chronic respiratory failure with hypoxia ([place]) 06/07/2013  . Cystocele 06/06/2013  . Anosmia 01/25/2013  . Unspecified nutritional deficiency   . Polyneuropathy in other diseases classified elsewhere ([place])   . OSA (obstructive sleep apnea)   . Cough 07/27/2012  . Essential hypertension    PCP:  Berkley Harvey, NP Pharmacy:   [facility] [place], [address] [place] [address] Phone: [phone] Fax: [phone]  [place], [place] [address] [place] [address] Phone: [phone] Fax: [phone]     Social Determinants of Health (SDOH) Interventions    Readmission Risk Interventions No flowsheet data found.

## 2019-06-30 MED ORDER — ROPIVACAINE HCL 7.5 MG/ML IJ SOLN
INTRAMUSCULAR | Status: DC | PRN
  Administered 2019-06-27: 20 mL via PERINEURAL

## 2019-06-30 NOTE — Anesthesia Procedure Notes (Signed)
Anesthesia Regional Block: Adductor canal block   Pre-Anesthetic Checklist: ,, timeout performed, Correct Patient, Correct Site, Correct Laterality, Correct Procedure, Correct Position, site marked, Risks and benefits discussed,  Surgical consent,  Pre-op evaluation,  At surgeon's request and post-op pain management  Laterality: Right  Prep: chloraprep       Needles:  Injection technique: Single-shot  Needle Type: Echogenic Stimulator Needle     Needle Length: 9cm  Needle Gauge: 21     Additional Needles:   Procedures:,,,, ultrasound used (permanent image in chart),,,,  Narrative:  Start time: 06/27/2019 9:30 AM End time: 06/27/2019 9:35 AM Injection made incrementally with aspirations every 5 mL.  Performed by: Personally  Anesthesiologist: Effie Berkshire, MD  Additional Notes: Patient tolerated the procedure well. Local anesthetic introduced in an incremental fashion under minimal resistance after negative aspirations. No paresthesias were elicited. After completion of the procedure, no acute issues were identified and patient continued to be monitored by RN.

## 2019-06-30 NOTE — Addendum Note (Signed)
Addendum  created 06/30/19 1828 by Effie Berkshire, MD   Attestation recorded in [place], Child order released for a procedure order, Clinical Note Signed, Intraprocedure Attestations filed, Intraprocedure Blocks edited, Intraprocedure Event edited

## 2019-07-01 LAB — BPAM RBC
Blood Product Expiration Date: 202101082359
Blood Product Expiration Date: 202101082359
Unit Type and Rh: 600
Unit Type and Rh: 600

## 2019-07-01 LAB — TYPE AND SCREEN
ABO/RH(D): A NEG
Antibody Screen: POSITIVE
Donor AG Type: NEGATIVE
Donor AG Type: NEGATIVE
Unit division: 0
Unit division: 0

## 2019-07-13 ENCOUNTER — Ambulatory Visit: Payer: Medicaid Other | Admitting: Physical Therapy

## 2019-07-18 ENCOUNTER — Ambulatory Visit: Payer: Medicaid Other | Admitting: Physical Therapy

## 2019-07-22 ENCOUNTER — Ambulatory Visit: Payer: Medicaid Other | Admitting: Physical Therapy

## 2019-07-25 ENCOUNTER — Encounter: Payer: Self-pay | Admitting: Physical Therapy

## 2019-07-25 ENCOUNTER — Ambulatory Visit: Payer: Medicaid Other | Attending: Orthopedic Surgery | Admitting: Physical Therapy

## 2019-07-25 ENCOUNTER — Other Ambulatory Visit: Payer: Self-pay

## 2019-07-25 DIAGNOSIS — M25661 Stiffness of right knee, not elsewhere classified: Secondary | ICD-10-CM | POA: Diagnosis present

## 2019-07-25 DIAGNOSIS — R262 Difficulty in walking, not elsewhere classified: Secondary | ICD-10-CM | POA: Diagnosis present

## 2019-07-25 DIAGNOSIS — M25561 Pain in right knee: Secondary | ICD-10-CM

## 2019-07-25 DIAGNOSIS — R600 Localized edema: Secondary | ICD-10-CM

## 2019-07-25 NOTE — Therapy (Signed)
[address] [place], [place], [address] Phone: [phone]   Fax:  [phone]  Physical Therapy Evaluation  Patient Details  Name: Ann [name] MRN: [id number] Date of Birth: 1955-12-29 Referring Provider (PT): Mayer Camel   Encounter Date: 07/25/2019  PT End of Session - 07/25/19 1505    Visit Number  1    Number of Visits  4    Date for PT Re-Evaluation  08/25/19    Authorization Type  Medicaid    PT Start Time  [id number]    PT Stop Time  1520    PT Time Calculation (min)  45 min    Activity Tolerance  Patient tolerated treatment well    Behavior During Therapy  [facility] for tasks assessed/performed;Anxious       Past Medical History:  Diagnosis Date  . Adenoma    throat  . Adenoma of colon   . Allergy    seasonal  . Anemia    with past pregnancy -not recent  . Anxiety   . Arthritis   . Borderline diabetes    diet controlled - no meds, history of  . CHF (congestive heart failure) ([place])   . Chronic cystitis   . CKD (chronic kidney disease), stage II   . Complication of anesthesia    02-20-2014 ([facility]) INTRA-OP RESPIRATORY FAILURE SECONDARY TO POSSIBLE MUCOUS ASPIRATION/  ALSO 06-06-2013 [facility]) POST-OP DESATURATION  EVEN USING CPAP, States some issues if Propofol given to rapidly.  Marland Kitchen COPD (chronic obstructive pulmonary disease) ([place])   . Diverticulitis   . Diverticulosis of colon   . Dyspnea    only needs O2 at Night 4L  . Family history of adverse reaction to anesthesia    PER PT SISTER DIED AFTER GENERAL ANESTHESIA DUE TO UNDIAGNOSED OSA  . GERD (gastroesophageal reflux disease)   . H/O hiatal hernia   . Headache(784.0)    migraines, decreased since turning 50's  . History of chronic cough    "tight sounding cough"  . History of esophageal dilatation   . History of kidney stones   . History of MRSA infection    3/ 2015  AXILLARY ABSCESS  . History of recurrent UTIs   . History of TIAs NO RESIDUAL    1980;  2005;   2008 PT STATES PER CT  SCARRING RIGHT SIDE OF BRAIN  . Hyperlipidemia   . Hypertension   . Inguinal hernia   . Neuromuscular disorder ([place])    HH  . Neuropathy    pt denies this 07-05-2015  . Nocturia   . OSA on CPAP    PER SLEEP STUDY 09-08-2012  MODERATE OSA. not used in awhile, but thinks needs to start back using due to some weight gain.  . Osteoarthritis of knee    Right  . Pneumonia   . Skin cancer   . Stroke [facility]) [phone]   TIA'S slight weakness on lt leg  . SUI (stress urinary incontinence, female)   . SVD (spontaneous vaginal delivery)    x 3    Past Surgical History:  Procedure Laterality Date  . ANTERIOR AND POSTERIOR REPAIR N/A 06/06/2013   Procedure: Anterior vaginal vault repair, Sacrospinous ligament fixation with UPHOLD lite, Kelly plication, Sacrospinous mesh fixation, Solyx transurethral sling;  Surgeon: Ailene Rud, MD;  Location: [facility];  Service: Urology;  Laterality: N/A;  . CHOLECYSTECTOMY N/A 02/10/2017   Procedure: LAPAROSCOPIC CHOLECYSTECTOMY;  Surgeon: Coralie Keens, MD;  Location: [place];  Service: General;  Laterality: N/A;  . COLECTOMY WITH COLOSTOMY CREATION/HARTMANN PROCEDURE N/A 06/11/2017   Procedure: OPEN SIGMOID COLECTOMY;  Surgeon: [name], MD;  Location: [facility];  Service: General;  Laterality: N/A;  . COLONOSCOPY  08/2016   polyps - repeat 5 years Arm[facility]  . CYSTOSCOPY W/ URETERAL STENT PLACEMENT Right 04/21/2014   Procedure: CYSTOSCOPY WITH RETROGRADE PYELOGRAM/URETERAL STENT PLACEMENT;  Surgeon: Ailene Rud, MD;  [facility];  Service: Urology;  Laterality: Right;  . CYSTOSCOPY W/ URETERAL STENT PLACEMENT Right 11/21/2014   Procedure: CYSTOSCOPY WITH RETROGRADE PYELOGRAM, URETEROSCOPY WITH STONE REMOVAL, URETERAL STENT PLACEMENT;  Surgeon: Carolan Clines, MD;  [facility];  Service: Urology;  Laterality: Right;  . CYSTOSCOPY W/ URETERAL STENT PLACEMENT Left  02/01/2016   Procedure: CYSTO, LEFT URETEROSCOPY, LEFT RETROGRADE AND LEFT URETERAL STENT PLACEMENT;  Surgeon: Carolan Clines, MD;  [facility];  Service: Urology;  Laterality: Left;  . CYSTOSCOPY W/ URETERAL STENT PLACEMENT Right 05/08/2019   Procedure: CYSTOSCOPY WITH RETROGRADE PYELOGRAM/URETERAL STENT PLACEMENT;  Surgeon: Festus Aloe, MD;  [facility];  Service: Urology;  Laterality: Right;  . CYSTOSCOPY WITH RETROGRADE PYELOGRAM, URETEROSCOPY AND STENT PLACEMENT Right 06/05/2014   Procedure: CYSTOSCOPY WITH RIGHT RETROGRADE PYELOGRAM, RIGHT URETEROSCOPY, STONE EXTRACTION AND RIGHT DOUBLE J STENT PLACEMENT;  Surgeon: Ailene Rud, MD;  Location: [facility];  Service: Urology;  Laterality: Right;  . CYSTOSCOPY/URETEROSCOPY/HOLMIUM LASER/STENT PLACEMENT Right 05/23/2019   Procedure: CYSTOSCOPY/URETEROSCOPY/STENT EXCHANGE;  Surgeon: Festus Aloe, MD;  [facility];  Service: Urology;  Laterality: Right;  ONLY NEEDS 60 MIN  . ESOPHAGEAL DILATION  X2  LAST ONE  JUNE 2014   has had 9 of these  . ESOPHAGEAL MANOMETRY N/A 09/10/2015   Procedure: ESOPHAGEAL MANOMETRY (EM);  Surgeon: Manus Gunning, MD;  [facility];  Service: Gastroenterology;  Laterality: N/A;  . FRACTURE SURGERY  09/2018   broken nose no surgery  . HOLMIUM LASER APPLICATION Right [id number]   Procedure: HOLMIUM LASER OF STONE ;  Surgeon: Ailene Rud, MD;  Location: [facility];  Service: Urology;  Laterality: Right;  . KNEE ARTHROSCOPY Bilateral 2008   x 2  . LAPAROSCOPIC CHOLECYSTECTOMY  02/10/2017  . MULTIPLE TOOTH EXTRACTIONS     past hx  . RECTOCELE REPAIR N/A 02/20/2014   Procedure: POSTERIOR REPAIR (RECTOCELE) WITH VAGINAL VAULT REPAIR WITH ACELL GRAFT PLACEMENT, PERINEOPLASTY, ENTEROCELE REPAIR;  Surgeon: Ailene Rud, MD;  [facility];  Service: Urology;  Laterality: N/A;  . TOTAL KNEE ARTHROPLASTY Left 03/28/2019    Procedure: LEFT TOTAL KNEE ARTHROPLASTY;  Surgeon: Frederik Pear, MD;  Location[facility];  Service: Orthopedics;  Laterality: Left;  . TOTAL KNEE ARTHROPLASTY Right 06/27/2019   Procedure: RIGHT TOTAL KNEE ARTHROPLASTY;  Surgeon: Frederik Pear, MD;  [facility];  Service: Orthopedics;  Laterality: Right;  . TUBAL LIGATION  1987  . UPPER GASTROINTESTINAL ENDOSCOPY  07/29/2018   [facility] - need to repeat per MD  . Arnetha Courser TOOTH EXTRACTION      There were no vitals filed for this visit.   Subjective Assessment - 07/25/19 1443    Subjective  Patient was seen here back in October for left TKA in September.  She underwent a right TKA on 06/27/19.  She reports that she has had some complications with the knee swelling and pain in the right medial knee and into the medial shin area    Limitations  Lifting;Standing;Walking;House hold activities  Patient Stated Goals  have less pain, better motions and walk better    Currently in Pain?  Yes    Pain Score  7     Pain Location  Knee    Pain Orientation  Right;Medial    Pain Descriptors / Indicators  Aching;Sore    Pain Type  Acute pain;Surgical pain    Pain Onset  1 to 4 weeks ago    Pain Frequency  Constant    Aggravating Factors   bending and walking pain up to 9/10    Pain Relieving Factors  ice, elevation, pain meds, pain at best 5/10    Effect of Pain on Daily Activities  difficulty with all walking, pain and bending         [facility] PT Assessment - 07/25/19 0001      Assessment   Medical Diagnosis  s/p right TKA    Referring Provider (PT)  Mayer Camel    Onset Date/Surgical Date  06/27/19    Prior Therapy  last Octobeer      Precautions   Precautions  None      Balance Screen   Has the patient fallen in the past 6 months  No    Has the patient had a decrease in activity level because of a fear of falling?   No    Is the patient reluctant to leave their home because of a fear of falling?   No      Home Environment   Additional  Comments  has stairs, does housework and was doing Haematologist      Prior Function   Level of Independence  Independent    Vocation  Unemployed    Leisure  no exercise      Circumferential Edema   Circumferential - Right  45 cm    Circumferential - Left   41.5      AROM   Right/Left Knee  Right    Right Knee Extension  15    Right Knee Flexion  90      PROM   Right/Left Knee  Right    Right Knee Extension  12    Right Knee Flexion  95      Strength   Overall Strength Comments  3+/5 with significant pain in the medial shin area, just below the knee      Palpation   Palpation comment  mild warmth, very tender in the distal medial knee area, she c/o most of her pain in this area      Ambulation/Gait   Gait Comments  npo device, antalgic on the right, has a bent knee at stance phase and then a stilff leg at swingthrough                Objective measurements completed on examination: See above findings.      [place] Adult PT Treatment/Exercise - 07/25/19 0001      Vasopneumatic   Number Minutes Vasopneumatic   10 minutes    Vasopnuematic Location   Knee    Vasopneumatic Pressure  Medium    Vasopneumatic Temperature   40                  PT Long Term Goals - 07/25/19 1509      PT LONG TERM GOAL #1   Title  decrease pain 50%    Time  12    Period  Weeks    Status  New      PT  LONG TERM GOAL #2   Title  increase right knee AROM to 5-115 degrees flexion    Time  12    Period  Weeks    Status  New      PT LONG TERM GOAL #3   Title  walk without device with minimal deviation    Time  12    Period  Weeks    Status  New      PT LONG TERM GOAL #4   Title  go up and down stairs step over step    Time  12    Period  Weeks    Status  New      PT LONG TERM GOAL #5   Title  independent with HEP    Time  12    Period  Weeks    Status  New             Plan - 07/25/19 1506    Clinical Impression Statement  Patient was seen here last  October due to a left TKR.  She underwent a right TKR on 06/27/19.  She reports that she feels like she is not doing as well, she c/o stiffness and pain.  Her AROM is 15-90 degrees flexion.  She is stiff and very tender and painful in the right medial knee area.  She is not using a device for walking but is stiff with walking and antalgic on the right.    Stability/Clinical Decision Making  Evolving/Moderate complexity    Clinical Decision Making  Low    Rehab Potential  Good    PT Frequency  1x / week    PT Duration  12 weeks    PT Treatment/Interventions  ADLs/Self Care Home Management;Cryotherapy;Electrical Stimulation;Gait training;Stair training;Functional mobility training;Neuromuscular re-education;Balance training;Therapeutic exercise;Therapeutic activities;Patient/family education;Manual techniques;Vasopneumatic Device    PT Next Visit Plan  Will submit to Medicaid and work on ROM and walking, try to help her pain level    Consulted and Agree with Plan of Care  Patient       Patient will benefit from skilled therapeutic intervention in order to improve the following deficits and impairments:  Abnormal gait, Pain, Decreased mobility, Decreased strength, Decreased range of motion, Decreased activity tolerance, Decreased balance, Difficulty walking, Increased edema  Visit Diagnosis: Acute pain of right knee - Plan: PT plan of care cert/re-cert  Stiffness of right knee, not elsewhere classified - Plan: PT plan of care cert/re-cert  Localized edema - Plan: PT plan of care cert/re-cert  Difficulty in walking, not elsewhere classified - Plan: PT plan of care cert/re-cert     Problem List Patient Active Problem List   Diagnosis Date Noted  . S/P TKR (total knee replacement), right 06/27/2019  . Total knee replacement status, right 06/27/2019  . Hydronephrosis, right 05/08/2019  . S/P total knee arthroplasty, left 03/28/2019  . Degenerative arthritis of left knee 03/18/2019  . Rash  08/10/2018  . Pruritic rash 08/09/2018  . Headache 08/09/2018  . Osteoarthritis of right knee 08/28/2017  . Abnormal CT scan, colon   . HLD (hyperlipidemia) 06/03/2017  . Stroke (cerebrum) ([place]) 06/03/2017  . GERD (gastroesophageal reflux disease) 06/03/2017  . Anxiety 06/03/2017  . Chronic combined systolic (congestive) and diastolic (congestive) heart failure ([place]) 06/03/2017  . Hypokalemia 06/03/2017  . Diverticulitis large intestine 06/03/2017  . Lower abdominal pain 06/03/2017  . Non-intractable vomiting with nausea   . Abnormal CT of the abdomen   . Generalized abdominal pain   .  LFT elevation   . Diverticulitis of colon 05/18/2017  . Symptomatic cholelithiasis 02/10/2017  . Hypoxia 01/11/2017  . Dyspnea 01/11/2017  . Dysphagia 04/04/2015  . History of esophageal stricture 04/04/2015  . Morbid (severe) obesity due to excess calories ([place]) 04/01/2015  . ACE-inhibitor cough 03/12/2015  . Upper airway cough syndrome 03/12/2015  . Laryngopharyngeal reflux (LPR) 03/12/2015  . Elevated lipids 07/18/2014  . Chest pain 07/18/2014  . Internal hemorrhoids 04/25/2014  . Ureteral stone with hydronephrosis 04/22/2014  . Post-op pain 04/21/2014  . Rectocele, grade 3 02/20/2014  . Female rectocele without uterine prolapse 08/04/2013  . Unspecified constipation 07/23/2013  . Chronic respiratory failure with hypoxia ([place]) 06/07/2013  . Cystocele 06/06/2013  . Anosmia 01/25/2013  . Unspecified nutritional deficiency   . Polyneuropathy in other diseases classified elsewhere ([employer])   . OSA (obstructive sleep apnea)   . Cough 07/27/2012  . Essential hypertension     Sumner Boast., PT 07/25/2019, 3:12 PM  [address] [place], [place], [address] Phone: [phone]   Fax:  [phone]  Name: Ann [name] MRN: [id number] Date of Birth: 1956-05-24

## 2019-07-28 ENCOUNTER — Ambulatory Visit: Payer: Medicaid Other | Admitting: Physical Therapy

## 2019-08-04 ENCOUNTER — Other Ambulatory Visit: Payer: Self-pay

## 2019-08-04 ENCOUNTER — Encounter: Payer: Self-pay | Admitting: Physical Therapy

## 2019-08-04 ENCOUNTER — Ambulatory Visit: Payer: Medicaid Other | Admitting: Physical Therapy

## 2019-08-04 DIAGNOSIS — M25661 Stiffness of right knee, not elsewhere classified: Secondary | ICD-10-CM

## 2019-08-04 DIAGNOSIS — M25561 Pain in right knee: Secondary | ICD-10-CM | POA: Diagnosis not present

## 2019-08-04 DIAGNOSIS — R262 Difficulty in walking, not elsewhere classified: Secondary | ICD-10-CM

## 2019-08-04 DIAGNOSIS — R600 Localized edema: Secondary | ICD-10-CM

## 2019-08-04 NOTE — Therapy (Signed)
[address] [place], [place], [address] Phone: [phone]   Fax:  [phone]  Physical Therapy Treatment  Patient Details  Name: Ann [name] MRN: [id number] Date of Birth: 10-19-1955 Referring Provider (PT): Mayer Camel   Encounter Date: 08/04/2019  PT End of Session - 08/04/19 1527    Visit Number  2    Number of Visits  4    Date for PT Re-Evaluation  08/25/19    Authorization Type  Medicaid    PT Start Time  9381   ice   PT Stop Time  1538    PT Time Calculation (min)  55 min    Activity Tolerance  Patient tolerated treatment well    Behavior During Therapy  [facility] for tasks assessed/performed;Anxious       Past Medical History:  Diagnosis Date  . Adenoma    throat  . Adenoma of colon   . Allergy    seasonal  . Anemia    with past pregnancy -not recent  . Anxiety   . Arthritis   . Borderline diabetes    diet controlled - no meds, history of  . CHF (congestive heart failure) ([place])   . Chronic cystitis   . CKD (chronic kidney disease), stage II   . Complication of anesthesia    02-20-2014 ([facility]) INTRA-OP RESPIRATORY FAILURE SECONDARY TO POSSIBLE MUCOUS ASPIRATION/  ALSO 06-06-2013 [facility]) POST-OP DESATURATION  EVEN USING CPAP, States some issues if Propofol given to rapidly.  Marland Kitchen COPD (chronic obstructive pulmonary disease) ([place])   . Diverticulitis   . Diverticulosis of colon   . Dyspnea    only needs O2 at Night 4L  . Family history of adverse reaction to anesthesia    PER PT SISTER DIED AFTER GENERAL ANESTHESIA DUE TO UNDIAGNOSED OSA  . GERD (gastroesophageal reflux disease)   . H/O hiatal hernia   . Headache(784.0)    migraines, decreased since turning 50's  . History of chronic cough    "tight sounding cough"  . History of esophageal dilatation   . History of kidney stones   . History of MRSA infection    3/ 2015  AXILLARY ABSCESS  . History of recurrent UTIs   . History of TIAs NO  RESIDUAL   1980;  2005;   2008 PT STATES PER CT  SCARRING RIGHT SIDE OF BRAIN  . Hyperlipidemia   . Hypertension   . Inguinal hernia   . Neuromuscular disorder ([place])    HH  . Neuropathy    pt denies this 07-05-2015  . Nocturia   . OSA on CPAP    PER SLEEP STUDY 09-08-2012  MODERATE OSA. not used in awhile, but thinks needs to start back using due to some weight gain.  . Osteoarthritis of knee    Right  . Pneumonia   . Skin cancer   . Stroke [facility]) [phone]   TIA'S slight weakness on lt leg  . SUI (stress urinary incontinence, female)   . SVD (spontaneous vaginal delivery)    x 3    Past Surgical History:  Procedure Laterality Date  . ANTERIOR AND POSTERIOR REPAIR N/A 06/06/2013   Procedure: Anterior vaginal vault repair, Sacrospinous ligament fixation with UPHOLD lite, Kelly plication, Sacrospinous mesh fixation, Solyx transurethral sling;  Surgeon: Ailene Rud, MD;  Location: [facility];  Service: Urology;  Laterality: N/A;  . CHOLECYSTECTOMY N/A 02/10/2017   Procedure: LAPAROSCOPIC CHOLECYSTECTOMY;  Surgeon: Ninfa [name],  Nathaneil Canary, MD;  Location: [facility];  Service: General;  Laterality: N/A;  . COLECTOMY WITH COLOSTOMY CREATION/HARTMANN PROCEDURE N/A 06/11/2017   Procedure: OPEN SIGMOID COLECTOMY;  Surgeon: [name], MD;  Location: [facility];  Service: General;  Laterality: N/A;  . COLONOSCOPY  08/2016   polyps - repeat 5 years [facility]  . CYSTOSCOPY W/ URETERAL STENT PLACEMENT Right 04/21/2014   Procedure: CYSTOSCOPY WITH RETROGRADE PYELOGRAM/URETERAL STENT PLACEMENT;  Surgeon: Ailene Rud, MD;  Location: [facility];  Service: Urology;  Laterality: Right;  . CYSTOSCOPY W/ URETERAL STENT PLACEMENT Right 11/21/2014   Procedure: CYSTOSCOPY WITH RETROGRADE PYELOGRAM, URETEROSCOPY WITH STONE REMOVAL, URETERAL STENT PLACEMENT;  Surgeon: Carolan Clines, MD;  Location[facility];  Service: Urology;  Laterality: Right;  . CYSTOSCOPY W/ URETERAL STENT  PLACEMENT Left 02/01/2016   Procedure: CYSTO, LEFT URETEROSCOPY, LEFT RETROGRADE AND LEFT URETERAL STENT PLACEMENT;  Surgeon: Carolan Clines, MD;  Location[facility];  Service: Urology;  Laterality: Left;  . CYSTOSCOPY W/ URETERAL STENT PLACEMENT Right 05/08/2019   Procedure: CYSTOSCOPY WITH RETROGRADE PYELOGRAM/URETERAL STENT PLACEMENT;  Surgeon: Festus Aloe, MD;  [facility];  Service: Urology;  Laterality: Right;  . CYSTOSCOPY WITH RETROGRADE PYELOGRAM, URETEROSCOPY AND STENT PLACEMENT Right 06/05/2014   Procedure: CYSTOSCOPY WITH RIGHT RETROGRADE PYELOGRAM, RIGHT URETEROSCOPY, STONE EXTRACTION AND RIGHT DOUBLE J STENT PLACEMENT;  Surgeon: Ailene Rud, MD;  Location: [facility];  Service: Urology;  Laterality: Right;  . CYSTOSCOPY/URETEROSCOPY/HOLMIUM LASER/STENT PLACEMENT Right 05/23/2019   Procedure: CYSTOSCOPY/URETEROSCOPY/STENT EXCHANGE;  Surgeon: Festus Aloe, MD;  [facility];  Service: Urology;  Laterality: Right;  ONLY NEEDS 60 MIN  . ESOPHAGEAL DILATION  X2  LAST ONE  JUNE 2014   has had 9 of these  . ESOPHAGEAL MANOMETRY N/A 09/10/2015   Procedure: ESOPHAGEAL MANOMETRY (EM);  Surgeon: Manus Gunning, MD;  [facility];  Service: Gastroenterology;  Laterality: N/A;  . FRACTURE SURGERY  09/2018   broken nose no surgery  . HOLMIUM LASER APPLICATION Right 83/41/9622   Procedure: HOLMIUM LASER OF STONE ;  Surgeon: Ailene Rud, MD;  Location[facility];  Service: Urology;  Laterality: Right;  . KNEE ARTHROSCOPY Bilateral 2008   x 2  . LAPAROSCOPIC CHOLECYSTECTOMY  02/10/2017  . MULTIPLE TOOTH EXTRACTIONS     past hx  . RECTOCELE REPAIR N/A 02/20/2014   Procedure: POSTERIOR REPAIR (RECTOCELE) WITH VAGINAL VAULT REPAIR WITH ACELL GRAFT PLACEMENT, PERINEOPLASTY, ENTEROCELE REPAIR;  Surgeon: Ailene Rud, MD;  [facility];  Service: Urology;  Laterality: N/A;  . TOTAL KNEE ARTHROPLASTY Left  03/28/2019   Procedure: LEFT TOTAL KNEE ARTHROPLASTY;  Surgeon: Frederik Pear, MD;  Location[facility];  Service: Orthopedics;  Laterality: Left;  . TOTAL KNEE ARTHROPLASTY Right 06/27/2019   Procedure: RIGHT TOTAL KNEE ARTHROPLASTY;  Surgeon: Frederik Pear, MD;  [facility];  Service: Orthopedics;  Laterality: Right;  . TUBAL LIGATION  1987  . UPPER GASTROINTESTINAL ENDOSCOPY  07/29/2018   [facility] - need to repeat per MD  . Arnetha Courser TOOTH EXTRACTION      There were no vitals filed for this visit.  Subjective Assessment - 08/04/19 1444    Subjective  Patient reports that she had a fall two days ago, she reports that she has been very stiff since the fall, reports some bruising right lateral thigh area    Currently in Pain?  Yes    Pain Score  5     Pain Location  Knee  Pain Orientation  Right    Pain Descriptors / Indicators  Aching    Pain Type  Acute pain    Aggravating Factors   bending, the fall really "set me back"         [facility] PT Assessment - 08/04/19 0001      AROM   Right Knee Extension  15    Right Knee Flexion  95                   [facility] Adult PT Treatment/Exercise - 08/04/19 0001      Ambulation/Gait   Gait Comments  stairs working on step over step with hand rail and hand hold assist, did well going up but has to do one at a time going down      Lumbar Exercises: Aerobic   Recumbent Bike  Bike x 4 minutes    Nustep  L 4 6 min      Knee/Hip Exercises: Machines for Strengthening   Cybex Knee Flexion  20# 2x10    Cybex Leg Press  20# 2x10, then no weight for more flexion      Knee/Hip Exercises: Standing   Terminal Knee Extension  Right;20 reps    Terminal Knee Extension Limitations  ball behind knee verbal and tactile cues    Forward Step Up  Step Height: 4";15 reps;Hand Hold: 1    Step Down  4 sets;Hand Hold: 1;Step Height: 4"      Manual Therapy   Manual Therapy  Passive ROM    Passive ROM  left kneee flex and ext                    PT Long Term Goals - 08/04/19 1529      PT LONG TERM GOAL #1   Title  decrease pain 50%    Status  On-going      PT LONG TERM GOAL #2   Title  increase right knee AROM to 5-115 degrees flexion    Status  On-going      PT LONG TERM GOAL #3   Title  walk without device with minimal deviation    Status  Partially Met      PT LONG TERM GOAL #4   Title  go up and down stairs step over step    Status  On-going            Plan - 08/04/19 1527    Clinical Impression Statement  Patient reports that she feels like she was really doing well, but that two days ago a dog knocked her down, she reports some bruising on the right lateral thigh area, she reports that she is stiff, can go ups steps step over step unable to go down that way.  Has some issues with TKE, had difficulty on the bike but able to do full revolutions    PT Next Visit Plan  work on her motions and function    Consulted and Agree with Plan of Care  Patient       Patient will benefit from skilled therapeutic intervention in order to improve the following deficits and impairments:  Abnormal gait, Pain, Decreased mobility, Decreased strength, Decreased range of motion, Decreased activity tolerance, Decreased balance, Difficulty walking, Increased edema  Visit Diagnosis: Acute pain of right knee  Stiffness of right knee, not elsewhere classified  Localized edema  Difficulty in walking, not elsewhere classified     Problem List Patient Active Problem List  Diagnosis Date Noted  . S/P TKR (total knee replacement), right 06/27/2019  . Total knee replacement status, right 06/27/2019  . Hydronephrosis, right 05/08/2019  . S/P total knee arthroplasty, left 03/28/2019  . Degenerative arthritis of left knee 03/18/2019  . Rash 08/10/2018  . Pruritic rash 08/09/2018  . Headache 08/09/2018  . Osteoarthritis of right knee 08/28/2017  . Abnormal CT scan, colon   . HLD (hyperlipidemia)  06/03/2017  . Stroke (cerebrum) ([place]) 06/03/2017  . GERD (gastroesophageal reflux disease) 06/03/2017  . Anxiety 06/03/2017  . Chronic combined systolic (congestive) and diastolic (congestive) heart failure ([place]) 06/03/2017  . Hypokalemia 06/03/2017  . Diverticulitis large intestine 06/03/2017  . Lower abdominal pain 06/03/2017  . Non-intractable vomiting with nausea   . Abnormal CT of the abdomen   . Generalized abdominal pain   . LFT elevation   . Diverticulitis of colon 05/18/2017  . Symptomatic cholelithiasis 02/10/2017  . Hypoxia 01/11/2017  . Dyspnea 01/11/2017  . Dysphagia 04/04/2015  . History of esophageal stricture 04/04/2015  . Morbid (severe) obesity due to excess calories ([place]) 04/01/2015  . ACE-inhibitor cough 03/12/2015  . Upper airway cough syndrome 03/12/2015  . Laryngopharyngeal reflux (LPR) 03/12/2015  . Elevated lipids 07/18/2014  . Chest pain 07/18/2014  . Internal hemorrhoids 04/25/2014  . Ureteral stone with hydronephrosis 04/22/2014  . Post-op pain 04/21/2014  . Rectocele, grade 3 02/20/2014  . Female rectocele without uterine prolapse 08/04/2013  . Unspecified constipation 07/23/2013  . Chronic respiratory failure with hypoxia ([place]) 06/07/2013  . Cystocele 06/06/2013  . Anosmia 01/25/2013  . Unspecified nutritional deficiency   . Polyneuropathy in other diseases classified elsewhere ([place])   . OSA (obstructive sleep apnea)   . Cough 07/27/2012  . Essential hypertension     Sumner Boast., PT 08/04/2019, 3:30 PM  [address], [place], [address] Phone: [phone]   Fax:  [phone]  Name: CHRISTINNA SPRUNG MRN: [id number] Date of Birth: 1956-05-25

## 2019-08-11 ENCOUNTER — Ambulatory Visit: Payer: Medicaid Other | Admitting: Physical Therapy

## 2019-08-18 ENCOUNTER — Ambulatory Visit: Payer: Medicaid Other | Admitting: Physical Therapy

## 2019-08-29 ENCOUNTER — Emergency Department (HOSPITAL_COMMUNITY): Payer: Medicaid Other

## 2019-08-29 ENCOUNTER — Emergency Department (HOSPITAL_COMMUNITY)
Admission: EM | Admit: 2019-08-29 | Discharge: 2019-08-29 | Disposition: A | Discharge status: Home / Self Care | Payer: Medicaid Other | Attending: Emergency Medicine | Admitting: Emergency Medicine

## 2019-08-29 ENCOUNTER — Encounter: Payer: Medicaid Other | Admitting: Physical Therapy

## 2019-08-29 ENCOUNTER — Other Ambulatory Visit: Payer: Self-pay

## 2019-08-29 ENCOUNTER — Encounter (HOSPITAL_COMMUNITY): Payer: Self-pay | Admitting: Emergency Medicine

## 2019-08-29 DIAGNOSIS — I509 Heart failure, unspecified: Secondary | ICD-10-CM | POA: Insufficient documentation

## 2019-08-29 DIAGNOSIS — Z7982 Long term (current) use of aspirin: Secondary | ICD-10-CM | POA: Insufficient documentation

## 2019-08-29 DIAGNOSIS — R109 Unspecified abdominal pain: Secondary | ICD-10-CM | POA: Diagnosis present

## 2019-08-29 DIAGNOSIS — N200 Calculus of kidney: Secondary | ICD-10-CM | POA: Diagnosis not present

## 2019-08-29 DIAGNOSIS — J449 Chronic obstructive pulmonary disease, unspecified: Secondary | ICD-10-CM | POA: Insufficient documentation

## 2019-08-29 DIAGNOSIS — Z79899 Other long term (current) drug therapy: Secondary | ICD-10-CM | POA: Insufficient documentation

## 2019-08-29 DIAGNOSIS — I130 Hypertensive heart and chronic kidney disease with heart failure and stage 1 through stage 4 chronic kidney disease, or unspecified chronic kidney disease: Secondary | ICD-10-CM | POA: Insufficient documentation

## 2019-08-29 DIAGNOSIS — N182 Chronic kidney disease, stage 2 (mild): Secondary | ICD-10-CM | POA: Diagnosis not present

## 2019-08-29 LAB — CBC WITH DIFFERENTIAL/PLATELET
Abs Immature Granulocytes: 0.01 10*3/uL (ref 0.00–0.07)
Basophils Absolute: 0 10*3/uL (ref 0.0–0.1)
Basophils Relative: 0 %
Eosinophils Absolute: 0 10*3/uL (ref 0.0–0.5)
Eosinophils Relative: 0 %
HCT: 41.9 % (ref 36.0–46.0)
Hemoglobin: 13.4 g/dL (ref 12.0–15.0)
Immature Granulocytes: 0 %
Lymphocytes Relative: 12 %
Lymphs Abs: 1 10*3/uL (ref 0.7–4.0)
MCH: 31.2 pg (ref 26.0–34.0)
MCHC: 32 g/dL (ref 30.0–36.0)
MCV: 97.7 fL (ref 80.0–100.0)
Monocytes Absolute: 0.5 10*3/uL (ref 0.1–1.0)
Monocytes Relative: 5 %
Neutro Abs: 6.9 10*3/uL (ref 1.7–7.7)
Neutrophils Relative %: 83 %
Platelets: 261 10*3/uL (ref 150–400)
RBC: 4.29 MIL/uL (ref 3.87–5.11)
RDW: 14.3 % (ref 11.5–15.5)
WBC: 8.4 10*3/uL (ref 4.0–10.5)
nRBC: 0 % (ref 0.0–0.2)

## 2019-08-29 LAB — URINALYSIS, ROUTINE W REFLEX MICROSCOPIC
Bilirubin Urine: NEGATIVE
Glucose, UA: NEGATIVE mg/dL
Hgb urine dipstick: NEGATIVE
Ketones, ur: NEGATIVE mg/dL
Nitrite: NEGATIVE
Protein, ur: 100 mg/dL — AB
Specific Gravity, Urine: 1.014 (ref 1.005–1.030)
pH: 6 (ref 5.0–8.0)

## 2019-08-29 LAB — COMPREHENSIVE METABOLIC PANEL
ALT: 13 U/L (ref 0–44)
AST: 20 U/L (ref 15–41)
Albumin: 3.9 g/dL (ref 3.5–5.0)
Alkaline Phosphatase: 70 U/L (ref 38–126)
Anion gap: 9 (ref 5–15)
BUN: 9 mg/dL (ref 8–23)
CO2: 23 mmol/L (ref 22–32)
Calcium: 8.9 mg/dL (ref 8.9–10.3)
Chloride: 109 mmol/L (ref 98–111)
Creatinine, Ser: 0.99 mg/dL (ref 0.44–1.00)
GFR calc Af Amer: >60 mL/min (ref >60)
GFR calc non Af Amer: >60 mL/min (ref >60)
Glucose, Bld: 128 mg/dL — ABNORMAL HIGH (ref 70–99)
Potassium: 4.1 mmol/L (ref 3.5–5.1)
Sodium: 141 mmol/L (ref 135–145)
Total Bilirubin: 1 mg/dL (ref 0.3–1.2)
Total Protein: 7.9 g/dL (ref 6.5–8.1)

## 2019-08-29 LAB — LIPASE, BLOOD: Lipase: 26 U/L (ref 11–51)

## 2019-08-29 MED ORDER — SODIUM CHLORIDE (PF) 0.9 % IJ SOLN
INTRAMUSCULAR | Status: AC
  Filled 2019-08-29: qty 50

## 2019-08-29 MED ORDER — METOCLOPRAMIDE HCL 10 MG PO TABS: 10.0000 mg | ORAL_TABLET | Freq: Four times a day (QID) | ORAL | 0 refills | Status: DC

## 2019-08-29 MED ORDER — KETOROLAC TROMETHAMINE 30 MG/ML IJ SOLN
30.0000 mg | Freq: Once | INTRAMUSCULAR | Status: AC
  Administered 2019-08-29: 30 mg via INTRAVENOUS
  Filled 2019-08-29: qty 1

## 2019-08-29 MED ORDER — SODIUM CHLORIDE 0.9 % IV BOLUS
500.0000 mL | Freq: Once | INTRAVENOUS | Status: AC
  Administered 2019-08-29: 500 mL via INTRAVENOUS

## 2019-08-29 MED ORDER — ONDANSETRON HCL 4 MG/2ML IJ SOLN
4.0000 mg | Freq: Once | INTRAMUSCULAR | Status: AC
  Administered 2019-08-29: 4 mg via INTRAVENOUS
  Filled 2019-08-29: qty 2

## 2019-08-29 MED ORDER — MORPHINE SULFATE (PF) 4 MG/ML IV SOLN
4.0000 mg | Freq: Once | INTRAVENOUS | Status: AC
  Administered 2019-08-29: 4 mg via INTRAVENOUS
  Filled 2019-08-29: qty 1

## 2019-08-29 MED ORDER — IOHEXOL 300 MG/ML  SOLN
100.0000 mL | Freq: Once | INTRAMUSCULAR | Status: AC | PRN
  Administered 2019-08-29: 100 mL via INTRAVENOUS

## 2019-08-29 MED ORDER — OXYCODONE-ACETAMINOPHEN 5-325 MG PO TABS: 1.0000 | ORAL_TABLET | ORAL | 0 refills | Status: DC | PRN

## 2019-08-29 NOTE — ED Notes (Signed)
Pt transported to imaging.

## 2019-08-29 NOTE — ED Notes (Signed)
Pt difficult stick IV consult placed and second RN consulted attempted X2 without success.

## 2019-08-29 NOTE — Discharge Instructions (Signed)
Take Phenergan as needed for nausea Take Percocet as needed every 4 hours for pain If Phenergan does not work you can try Reglan Please call [facility]'s office in the AM

## 2019-08-29 NOTE — ED Notes (Signed)
Pt ambulatory to RR independently urine sample cup provided.

## 2019-08-29 NOTE — ED Provider Notes (Signed)
64 year old female with 3 weeks of flank pain and vomiting. CT shows a 74mm kidney stone in the distal ureter with severe hydronephrosis. On recheck pt is feeling better but hesitant to be discharged. She states that she always gets septic from kidney stones.  She states that previously Dr. Gaynelle Arabian would always take her to the OR and put a stent in because she has ureters like "coffee straws".  On review of her work-up here she has no white count, normal kidney function, and no clear signs of UTI although has 21-50 WBC.  Will discuss with urology per her request  6:14 PM Discussed with Dr. Lovena Neighbours who recommends urine culture, symptom control, and to f/u in the office tomorrow or sometime this week.  7PM Discussed the plan with the patient.  She is understanding but unhappy with this plan.  Will prescribe Percocet, Reglan.  She is encouraged to call [facility] tomorrow morning for an appointment. Advised return if worsening    Recardo Evangelist, Hershal Coria 08/29/19 1930    Daleen Bo, MD 08/30/19 [phone]

## 2019-08-29 NOTE — ED Provider Notes (Signed)
[facility] DEPT Provider Note   CSN: [id number] Arrival date & time: 08/29/19  1002     History Chief Complaint  Patient presents with  . Hip Pain  . Emesis  . Diarrhea    Ann [name] is a 64 y.o. female.  HPI Patient presents to the emergency department with right-sided flank pain that radiates to her abdomen.  The patient states that she is having a lot of pain associated with this.  Patient states she fell several weeks ago and associates it with that.  Patient states that she did not take any medications prior to arrival for her symptoms.  The patient denies chest pain, shortness of breath, headache,blurred vision, neck pain, fever, cough, weakness, numbness, dizziness, anorexia, edema,  diarrhea, rash, back pain, dysuria, hematemesis, bloody stool, near syncope, or syncope.  Past Medical History:  Diagnosis Date  . Adenoma    throat  . Adenoma of colon   . Allergy    seasonal  . Anemia    with past pregnancy -not recent  . Anxiety   . Arthritis   . Borderline diabetes    diet controlled - no meds, history of  . CHF (congestive heart failure) ([place])   . Chronic cystitis   . CKD (chronic kidney disease), stage II   . Complication of anesthesia    02-20-2014 ([facility]) INTRA-OP RESPIRATORY FAILURE SECONDARY TO POSSIBLE MUCOUS ASPIRATION/  ALSO 06-06-2013 [facility]) POST-OP DESATURATION  EVEN USING CPAP, States some issues if Propofol given to rapidly.  Marland Kitchen COPD (chronic obstructive pulmonary disease) ([place])   . Diverticulitis   . Diverticulosis of colon   . Dyspnea    only needs O2 at Night 4L  . Family history of adverse reaction to anesthesia    PER PT SISTER DIED AFTER GENERAL ANESTHESIA DUE TO UNDIAGNOSED OSA  . GERD (gastroesophageal reflux disease)   . H/O hiatal hernia   . Headache(784.0)    migraines, decreased since turning 52's  . History of chronic cough    "tight sounding cough"  . History of esophageal dilatation   . History of  kidney stones   . History of MRSA infection    3/ 2015  AXILLARY ABSCESS  . History of recurrent UTIs   . History of TIAs NO RESIDUAL   1980;  2005;   2008 PT STATES PER CT  SCARRING RIGHT SIDE OF BRAIN  . Hyperlipidemia   . Hypertension   . Inguinal hernia   . Neuromuscular disorder ([place])    HH  . Neuropathy    pt denies this 07-05-2015  . Nocturia   . OSA on CPAP    PER SLEEP STUDY 09-08-2012  MODERATE OSA. not used in awhile, but thinks needs to start back using due to some weight gain.  . Osteoarthritis of knee    Right  . Pneumonia   . Skin cancer   . Stroke [facility]) [phone]   TIA'S slight weakness on lt leg  . SUI (stress urinary incontinence, female)   . SVD (spontaneous vaginal delivery)    x 3    Patient Active Problem List   Diagnosis Date Noted  . S/P TKR (total knee replacement), right 06/27/2019  . Total knee replacement status, right 06/27/2019  . Hydronephrosis, right 05/08/2019  . S/P total knee arthroplasty, left 03/28/2019  . Degenerative arthritis of left knee 03/18/2019  . Rash 08/10/2018  . Pruritic rash 08/09/2018  . Headache 08/09/2018  . Osteoarthritis of right knee 08/28/2017  .  Abnormal CT scan, colon   . HLD (hyperlipidemia) 06/03/2017  . 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 GERD (gastroesophageal reflux disease) 06/03/2017  . Anxiety 06/03/2017  . Chronic combined systolic (congestive) and diastolic (congestive) heart failure ([place]) 06/03/2017  . Hypokalemia 06/03/2017  . Diverticulitis large intestine 06/03/2017  . Lower abdominal pain 06/03/2017  . Non-intractable vomiting with nausea   . Abnormal CT of the abdomen   . Generalized abdominal pain   . LFT elevation   . Diverticulitis of colon 05/18/2017  . Symptomatic cholelithiasis 02/10/2017  . Hypoxia 01/11/2017  . Dyspnea 01/11/2017  . Dysphagia 04/04/2015  . History of esophageal stricture 04/04/2015  . Morbid (severe) obesity due to excess calories ([place]) 04/01/2015  .  ACE-inhibitor cough 03/12/2015  . Upper airway cough syndrome 03/12/2015  . Laryngopharyngeal reflux (LPR) 03/12/2015  . Elevated lipids 07/18/2014  . Chest pain 07/18/2014  . Internal hemorrhoids 04/25/2014  . Ureteral stone with hydronephrosis 04/22/2014  . Post-op pain 04/21/2014  . Rectocele, grade 3 02/20/2014  . Female rectocele without uterine prolapse 08/04/2013  . Unspecified constipation 07/23/2013  . Chronic respiratory failure with hypoxia ([place]) 06/07/2013  . Cystocele 06/06/2013  . Anosmia 01/25/2013  . Unspecified nutritional deficiency   . Polyneuropathy in other diseases classified elsewhere ([place])   . OSA (obstructive sleep apnea)   . Cough 07/27/2012  . Essential hypertension     Past Surgical History:  Procedure Laterality Date  . ANTERIOR AND POSTERIOR REPAIR N/A 06/06/2013   Procedure: Anterior vaginal vault repair, Sacrospinous ligament fixation with UPHOLD lite, Kelly plication, Sacrospinous mesh fixation, Solyx transurethral sling;  Surgeon: Ailene Rud, MD;  Location: [facility];  Service: Urology;  Laterality: N/A;  . CHOLECYSTECTOMY N/A 02/10/2017   Procedure: LAPAROSCOPIC CHOLECYSTECTOMY;  Surgeon: Coralie Keens, MD;  Location: [facility];  Service: General;  Laterality: N/A;  . COLECTOMY WITH COLOSTOMY CREATION/HARTMANN PROCEDURE N/A 06/11/2017   Procedure: OPEN SIGMOID COLECTOMY;  Surgeon: [name], MD;  Location[facility];  Service: General;  Laterality: N/A;  . COLONOSCOPY  08/2016   polyps - repeat 5 years Armbruster  . CYSTOSCOPY W/ URETERAL STENT PLACEMENT Right 04/21/2014   Procedure: CYSTOSCOPY WITH RETROGRADE PYELOGRAM/URETERAL STENT PLACEMENT;  Surgeon: Ailene Rud, MD;  [facility];  Service: Urology;  Laterality: Right;  . CYSTOSCOPY W/ URETERAL STENT PLACEMENT Right 11/21/2014   Procedure: CYSTOSCOPY WITH RETROGRADE PYELOGRAM, URETEROSCOPY WITH STONE REMOVAL, URETERAL STENT PLACEMENT;  Surgeon: Carolan Clines, MD;  [facility];  Service: Urology;  Laterality: Right;  . CYSTOSCOPY W/ URETERAL STENT PLACEMENT Left 02/01/2016   Procedure: CYSTO, LEFT URETEROSCOPY, LEFT RETROGRADE AND LEFT URETERAL STENT PLACEMENT;  Surgeon: Carolan Clines, MD;  [facility];  Service: Urology;  Laterality: Left;  . CYSTOSCOPY W/ URETERAL STENT PLACEMENT Right 05/08/2019   Procedure: CYSTOSCOPY WITH RETROGRADE PYELOGRAM/URETERAL STENT PLACEMENT;  Surgeon: Festus Aloe, MD;  [facility];  Service: Urology;  Laterality: Right;  . CYSTOSCOPY WITH RETROGRADE PYELOGRAM, URETEROSCOPY AND STENT PLACEMENT Right 06/05/2014   Procedure: CYSTOSCOPY WITH RIGHT RETROGRADE PYELOGRAM, RIGHT URETEROSCOPY, STONE EXTRACTION AND RIGHT DOUBLE J STENT PLACEMENT;  Surgeon: Ailene Rud, MD;  Location: [facility];  Service: Urology;  Laterality: Right;  . CYSTOSCOPY/URETEROSCOPY/HOLMIUM LASER/STENT PLACEMENT Right 05/23/2019   Procedure: CYSTOSCOPY/URETEROSCOPY/STENT EXCHANGE;  Surgeon: Festus Aloe, MD;  [facility];  Service: Urology;  Laterality: Right;  ONLY NEEDS 60 MIN  . ESOPHAGEAL DILATION  X2  LAST ONE  JUNE 2014   has  had 9 of these  . ESOPHAGEAL MANOMETRY N/A 09/10/2015   Procedure: ESOPHAGEAL MANOMETRY (EM);  Surgeon: Manus Gunning, MD;  [facility];  Service: Gastroenterology;  Laterality: N/A;  . FRACTURE SURGERY  09/2018   broken nose no surgery  . HOLMIUM LASER APPLICATION Right [id number]   Procedure: HOLMIUM LASER OF STONE ;  Surgeon: Ailene Rud, MD;  Location: [facility];  Service: Urology;  Laterality: Right;  . KNEE ARTHROSCOPY Bilateral 2008   x 2  . LAPAROSCOPIC CHOLECYSTECTOMY  02/10/2017  . MULTIPLE TOOTH EXTRACTIONS     past hx  . RECTOCELE REPAIR N/A 02/20/2014   Procedure: POSTERIOR REPAIR (RECTOCELE) WITH VAGINAL VAULT REPAIR WITH ACELL GRAFT PLACEMENT, PERINEOPLASTY, ENTEROCELE REPAIR;  Surgeon: Ailene Rud, MD;  [facility];  Service: Urology;  Laterality: N/A;  . TOTAL KNEE ARTHROPLASTY Left 03/28/2019   Procedure: LEFT TOTAL KNEE ARTHROPLASTY;  Surgeon: Frederik Pear, MD;  [facility];  Service: Orthopedics;  Laterality: Left;  . TOTAL KNEE ARTHROPLASTY Right 06/27/2019   Procedure: RIGHT TOTAL KNEE ARTHROPLASTY;  Surgeon: Frederik Pear, MD;  [facility];  Service: Orthopedics;  Laterality: Right;  . TUBAL LIGATION  1987  . UPPER GASTROINTESTINAL ENDOSCOPY  07/29/2018   Armbruster - need to repeat per MD  . Arnetha Courser TOOTH EXTRACTION       OB History   No obstetric history on file.     Family History  Problem Relation Age of Onset  . Colon cancer Father 50       died at 21  . Heart disease Mother        died at 37  . Heart attack Mother   . Heart attack Sister        died at 37  . Hypertension Brother   . Heart attack Cousin 58       with death  . Heart attack Cousin 27       died with MI  . Esophageal cancer Neg Hx   . Rectal cancer Neg Hx   . Stomach cancer Neg Hx   . Colon polyps Neg Hx     Social History   Tobacco Use  . Smoking status: Never Smoker  . Smokeless tobacco: Never Used  Substance Use Topics  . Alcohol use: No    Alcohol/week: 0.0 standard drinks  . Drug use: No    Home Medications Prior to Admission medications   Medication Sig Start Date End Date Taking? Authorizing Provider  amLODipine (NORVASC) 10 MG tablet Take 10 mg by mouth daily.    Yes [provider]  aspirin EC 81 MG tablet Take 1 tablet (81 mg total) by mouth 2 (two) times daily. 03/28/19  Yes Joanell Rising K, PA-C  atorvastatin (LIPITOR) 20 MG tablet Take 10 mg by mouth at bedtime.  05/15/17  Yes [provider]  celecoxib (CELEBREX) 200 MG capsule Take 200 mg by mouth 2 (two) times daily. 07/15/19  Yes [provider]  clonazePAM (KLONOPIN) 0.5 MG tablet Take 0.5 mg by mouth at bedtime. Can take BID   Yes [provider]  docusate  sodium (COLACE) 100 MG capsule Take 1 capsule (100 mg total) by mouth 2 (two) times daily. 05/13/19  Yes Sheikh, Omair Latif, DO  DULoxetine (CYMBALTA) 30 MG capsule Take 30 mg by mouth at bedtime.  03/21/19  Yes [provider]  gabapentin (NEURONTIN) 300 MG capsule Take 300 mg by mouth 3 (three) times daily.  07/15/19  Yes [provider]  hydrochlorothiazide (HYDRODIURIL) 25 MG tablet Take 25 mg by mouth daily as needed (swelling).    Yes [provider]  metoprolol succinate (TOPROL-XL) 50 MG 24 hr tablet Take 50 mg by mouth at bedtime.  08/08/15  Yes [provider]  omeprazole (PRILOSEC) 40 MG capsule TAKE 1 CAPSULE(40 MG) BY MOUTH TWICE DAILY Patient taking differently: Take 40 mg by mouth 2 (two) times daily.  09/17/16  Yes Armbruster, [name], MD  OXYGEN Inhale 4 L into the lungs at bedtime.   Yes [provider]  promethazine (PHENERGAN) 12.5 MG tablet Take 12.5 mg by mouth every 6 (six) hours as needed for nausea or vomiting.   Yes [provider]  tiZANidine (ZANAFLEX) 2 MG tablet Take 1 tablet (2 mg total) by mouth every 6 (six) hours as needed. 06/27/19  Yes Frederik Pear, MD  topiramate (TOPAMAX) 100 MG tablet Take 1 tablet (100 mg total) by mouth 2 (two) times daily. 01/06/19  Yes Marcial Pacas, MD  traMADol (ULTRAM) 50 MG tablet Take 50 mg by mouth every 8 (eight) hours as needed. 08/09/19  Yes [provider]  traZODone (DESYREL) 50 MG tablet Take 50 mg by mouth at bedtime as needed for sleep.  05/20/18  Yes [provider]  iron polysaccharides (NIFEREX) 150 MG capsule Take 1 capsule (150 mg total) by mouth daily. Patient not taking: Reported on 06/22/2019 05/14/19   Raiford Noble Latif, DO  oxyCODONE (OXY IR/ROXICODONE) 5 MG immediate release tablet Take 1 tablet (5 mg total) by mouth every 4 (four) hours as needed for moderate pain. Patient not taking: Reported on 08/29/2019 06/27/19   Frederik Pear, MD  senna (SENOKOT) 8.6  MG TABS tablet Take 1 tablet (8.6 mg total) by mouth 2 (two) times daily. Patient not taking: Reported on 06/22/2019 05/13/19   Raiford Noble Latif, DO  tamsulosin (FLOMAX) 0.4 MG CAPS capsule Take 1 capsule (0.4 mg total) by mouth daily after supper. Patient not taking: Reported on 08/29/2019 05/13/19   Raiford Noble Latif, DO    Allergies    Lisinopril, Lidocaine, Doxycycline, and Metronidazole  Review of Systems   Review of Systems All other systems negative except as documented in the HPI. All pertinent positives and negatives as reviewed in the HPI. Physical Exam Updated Vital Signs BP (!) 149/57   Pulse 77   Temp 98.1 F (36.7 C) (Oral)   Resp 18   SpO2 97%   Physical Exam Vitals and nursing note reviewed.  Constitutional:      General: She is not in acute distress.    Appearance: She is well-developed.  HENT:     Head: Normocephalic and atraumatic.  Eyes:     Pupils: Pupils are equal, round, and reactive to light.  Cardiovascular:     Rate and Rhythm: Normal rate and regular rhythm.     Heart sounds: Normal heart sounds. No murmur. No friction rub. No gallop.   Pulmonary:     Effort: Pulmonary effort is normal. No respiratory distress.     Breath sounds: Normal breath sounds. No wheezing.  Abdominal:     General: Bowel sounds are normal. There is no distension.     Palpations: Abdomen is soft.     Tenderness: There is abdominal tenderness. There is no guarding or rebound.  Musculoskeletal:     Cervical back: Normal range of motion and neck supple.  Skin:    General: Skin is warm and dry.  Capillary Refill: Capillary refill takes less than 2 seconds.     Findings: No erythema or rash.  Neurological:     Mental Status: She is alert and oriented to person, place, and time.     Motor: No abnormal muscle tone.     Coordination: Coordination normal.  Psychiatric:        Behavior: Behavior normal.     ED Results / Procedures / Treatments   Labs (all labs  ordered are listed, but only abnormal results are displayed) Labs Reviewed  COMPREHENSIVE METABOLIC PANEL - Abnormal; Notable for the following components:      Result Value   Glucose, Bld 128 (*)    All other components within normal limits  URINALYSIS, ROUTINE W REFLEX MICROSCOPIC - Abnormal; Notable for the following components:   Protein, ur 100 (*)    Leukocytes,Ua TRACE (*)    Bacteria, UA RARE (*)    All other components within normal limits  LIPASE, BLOOD  CBC WITH DIFFERENTIAL/PLATELET    EKG None  Radiology CT Abdomen Pelvis W Contrast  Result Date: 08/29/2019 CLINICAL DATA:  Abdominal pain, right hip bursitis EXAM: CT ABDOMEN AND PELVIS WITH CONTRAST TECHNIQUE: Multidetector CT imaging of the abdomen and pelvis was performed using the standard protocol following bolus administration of intravenous contrast. CONTRAST:  16mL OMNIPAQUE IOHEXOL 300 MG/ML  SOLN COMPARISON:  05/08/2019 FINDINGS: Lower chest: No acute abnormality. Hepatobiliary: No focal liver abnormality is seen. Status post cholecystectomy. No biliary dilatation. Pancreas: Unremarkable. No pancreatic ductal dilatation or surrounding inflammatory changes. Spleen: Normal in size without focal abnormality. Adrenals/Urinary Tract: Normal adrenal glands. Normal left kidney. 3 mm distal right ureteral calculus. Severe right hydronephrosis and dilatation of the proximal half of the right ureter. Right perinephric stranding. Bladder is unremarkable. Stomach/Bowel: Stomach is within normal limits. Appendix appears normal. No evidence of bowel wall thickening, distention, or inflammatory changes. Small umbilical hernia containing a loop of transverse colon. Vascular/Lymphatic: No significant vascular findings are present. No enlarged abdominal or pelvic lymph nodes. Reproductive: Uterus and bilateral adnexa are unremarkable. Other: No ascites. Musculoskeletal: No acute osseous abnormality. No aggressive osseous lesion. Mild  osteoarthritis of the SI joints bilaterally. Grade 1 anterolisthesis of L4 on L5 secondary to facet disease. IMPRESSION: 1. 3 mm distal right ureteral calculus. Severe right hydronephrosis and dilatation of the proximal half of the right ureter. Right perinephric stranding. 2. Small umbilical hernia containing a loop of transverse colon. Electronically Signed   By: Kathreen Devoid   On: 08/29/2019 14:23    Procedures Procedures (including critical care time)  Medications Ordered in ED Medications  ondansetron (ZOFRAN) injection 4 mg (4 mg Intravenous Given 08/29/19 1212)  morphine 4 MG/ML injection 4 mg (4 mg Intravenous Given 08/29/19 1212)  sodium chloride (PF) 0.9 % injection (  Given by Other 08/29/19 1427)  iohexol (OMNIPAQUE) 300 MG/ML solution 100 mL (100 mLs Intravenous Contrast Given 08/29/19 1348)    ED Course  I have reviewed the triage vital signs and the nursing notes.  Pertinent labs & imaging results that were available during my care of the patient were reviewed by me and considered in my medical decision making (see chart for details).    MDM Rules/Calculators/A&P                     Patient patient has a kidney stone that is present in the right distal ureter.  Patient is feeling better following medications.  Patient will be referred to  urology.  Told to return here as needed.  Patient agrees to the plan and all questions were answered Final Clinical Impression(s) / ED Diagnoses Final diagnoses:  None    Rx / DC Orders ED Discharge Orders    None       Dalia Heading, PA-C 08/29/19 1511    Tegeler, Gwenyth Allegra, MD 08/29/19 [phone]

## 2019-08-29 NOTE — ED Triage Notes (Signed)
Pt reports having right hip bursitis and was prescribed Tramadol but hasnt been taking for the pain due to n/v/d for week. Reports all this started jan 28 when had a bad fall.

## 2019-08-30 ENCOUNTER — Ambulatory Visit (HOSPITAL_COMMUNITY)
Admission: AD | Admit: 2019-08-30 | Discharge: 2019-08-30 | Disposition: A | Discharge status: Home / Self Care | Payer: Medicaid Other | Source: Ambulatory Visit | Attending: Urology | Admitting: Urology

## 2019-08-30 ENCOUNTER — Other Ambulatory Visit: Payer: Self-pay | Admitting: Urology

## 2019-08-30 ENCOUNTER — Other Ambulatory Visit: Payer: Self-pay

## 2019-08-30 ENCOUNTER — Encounter (HOSPITAL_COMMUNITY)
Admission: AD | Disposition: A | Discharge status: Home / Self Care | Payer: Self-pay | Source: Ambulatory Visit | Attending: Urology

## 2019-08-30 ENCOUNTER — Ambulatory Visit (HOSPITAL_COMMUNITY): Payer: Medicaid Other | Admitting: Anesthesiology

## 2019-08-30 ENCOUNTER — Encounter (HOSPITAL_COMMUNITY): Payer: Self-pay | Admitting: Urology

## 2019-08-30 ENCOUNTER — Ambulatory Visit (HOSPITAL_COMMUNITY): Payer: Medicaid Other

## 2019-08-30 DIAGNOSIS — I10 Essential (primary) hypertension: Secondary | ICD-10-CM | POA: Diagnosis not present

## 2019-08-30 DIAGNOSIS — G4733 Obstructive sleep apnea (adult) (pediatric): Secondary | ICD-10-CM | POA: Diagnosis not present

## 2019-08-30 DIAGNOSIS — N136 Pyonephrosis: Secondary | ICD-10-CM | POA: Diagnosis present

## 2019-08-30 DIAGNOSIS — Z79899 Other long term (current) drug therapy: Secondary | ICD-10-CM | POA: Insufficient documentation

## 2019-08-30 DIAGNOSIS — Z791 Long term (current) use of non-steroidal anti-inflammatories (NSAID): Secondary | ICD-10-CM | POA: Diagnosis not present

## 2019-08-30 DIAGNOSIS — G629 Polyneuropathy, unspecified: Secondary | ICD-10-CM | POA: Diagnosis not present

## 2019-08-30 DIAGNOSIS — Z20822 Contact with and (suspected) exposure to covid-19: Secondary | ICD-10-CM | POA: Insufficient documentation

## 2019-08-30 DIAGNOSIS — Z8673 Personal history of transient ischemic attack (TIA), and cerebral infarction without residual deficits: Secondary | ICD-10-CM | POA: Insufficient documentation

## 2019-08-30 DIAGNOSIS — Z87442 Personal history of urinary calculi: Secondary | ICD-10-CM | POA: Insufficient documentation

## 2019-08-30 DIAGNOSIS — N200 Calculus of kidney: Secondary | ICD-10-CM

## 2019-08-30 DIAGNOSIS — F419 Anxiety disorder, unspecified: Secondary | ICD-10-CM | POA: Insufficient documentation

## 2019-08-30 HISTORY — PX: CYSTOSCOPY WITH STENT PLACEMENT: SHX5790

## 2019-08-30 LAB — RESPIRATORY PANEL BY RT PCR (FLU A&B, COVID)
Influenza A by PCR: NEGATIVE
Influenza B by PCR: NEGATIVE
SARS Coronavirus 2 by RT PCR: NEGATIVE

## 2019-08-30 SURGERY — CYSTOSCOPY, WITH STENT INSERTION
Anesthesia: General

## 2019-08-30 MED ORDER — SODIUM CHLORIDE 0.9 % IR SOLN
Status: DC | PRN
  Administered 2019-08-30: 3000 mL via INTRAVESICAL

## 2019-08-30 MED ORDER — LACTATED RINGERS IV SOLN: INTRAVENOUS | Status: DC

## 2019-08-30 MED ORDER — MIDAZOLAM HCL 2 MG/2ML IJ SOLN
INTRAMUSCULAR | Status: AC
  Filled 2019-08-30: qty 2

## 2019-08-30 MED ORDER — DEXAMETHASONE SODIUM PHOSPHATE 10 MG/ML IJ SOLN
INTRAMUSCULAR | Status: AC
  Filled 2019-08-30: qty 1

## 2019-08-30 MED ORDER — ONDANSETRON HCL 4 MG/2ML IJ SOLN
INTRAMUSCULAR | Status: DC | PRN
  Administered 2019-08-30: 4 mg via INTRAVENOUS

## 2019-08-30 MED ORDER — FENTANYL CITRATE (PF) 100 MCG/2ML IJ SOLN
INTRAMUSCULAR | Status: AC
  Filled 2019-08-30: qty 2

## 2019-08-30 MED ORDER — PROPOFOL 10 MG/ML IV BOLUS
INTRAVENOUS | Status: AC
  Filled 2019-08-30: qty 20

## 2019-08-30 MED ORDER — GENTAMICIN SULFATE 40 MG/ML IJ SOLN
5.0000 mg/kg | INTRAVENOUS | Status: AC
  Administered 2019-08-30: 330 mg via INTRAVENOUS
  Filled 2019-08-30: qty 8.25

## 2019-08-30 MED ORDER — FENTANYL CITRATE (PF) 100 MCG/2ML IJ SOLN
INTRAMUSCULAR | Status: DC | PRN
  Administered 2019-08-30 (×2): 50 ug via INTRAVENOUS

## 2019-08-30 MED ORDER — PROPOFOL 10 MG/ML IV BOLUS
INTRAVENOUS | Status: DC | PRN
  Administered 2019-08-30: 200 mg via INTRAVENOUS

## 2019-08-30 MED ORDER — SULFAMETHOXAZOLE-TRIMETHOPRIM 800-160 MG PO TABS: 1.0000 | ORAL_TABLET | Freq: Two times a day (BID) | ORAL | 0 refills | Status: AC

## 2019-08-30 MED ORDER — ONDANSETRON HCL 4 MG/2ML IJ SOLN
INTRAMUSCULAR | Status: AC
  Filled 2019-08-30: qty 2

## 2019-08-30 MED ORDER — TRAMADOL HCL 50 MG PO TABS: 50.0000 mg | ORAL_TABLET | Freq: Four times a day (QID) | ORAL | 0 refills | Status: DC | PRN

## 2019-08-30 MED ORDER — LIDOCAINE 2% (20 MG/ML) 5 ML SYRINGE
INTRAMUSCULAR | Status: AC
  Filled 2019-08-30: qty 5

## 2019-08-30 MED ORDER — DEXAMETHASONE SODIUM PHOSPHATE 10 MG/ML IJ SOLN
INTRAMUSCULAR | Status: DC | PRN
  Administered 2019-08-30: 5 mg via INTRAVENOUS

## 2019-08-30 MED ORDER — PHENAZOPYRIDINE HCL 200 MG PO TABS: 200.0000 mg | ORAL_TABLET | Freq: Three times a day (TID) | ORAL | 0 refills | Status: DC | PRN

## 2019-08-30 MED ORDER — IOHEXOL 300 MG/ML  SOLN
INTRAMUSCULAR | Status: DC | PRN
  Administered 2019-08-30: 15 mL

## 2019-08-30 MED ORDER — MIDAZOLAM HCL 5 MG/5ML IJ SOLN
INTRAMUSCULAR | Status: DC | PRN
  Administered 2019-08-30: 2 mg via INTRAVENOUS

## 2019-08-30 SURGICAL SUPPLY — 12 items
BAG URO CATCHER STRL LF (MISCELLANEOUS) ×3 IMPLANT
CATH URET 5FR 28IN OPEN ENDED (CATHETERS) ×3 IMPLANT
CLOTH BEACON ORANGE TIMEOUT ST (SAFETY) ×3 IMPLANT
GLOVE BIOGEL M STRL SZ7.5 (GLOVE) ×3 IMPLANT
GOWN STRL REUS W/TWL XL LVL3 (GOWN DISPOSABLE) ×3 IMPLANT
GUIDEWIRE STR DUAL SENSOR (WIRE) ×3 IMPLANT
KIT TURNOVER KIT A (KITS) IMPLANT
MANIFOLD NEPTUNE II (INSTRUMENTS) ×3 IMPLANT
PACK CYSTO (CUSTOM PROCEDURE TRAY) ×3 IMPLANT
STENT URET 6FRX24 CONTOUR (STENTS) ×2 IMPLANT
TUBING CONNECTING 10 (TUBING) IMPLANT
TUBING CONNECTING 10' (TUBING)

## 2019-08-30 NOTE — H&P (View-Only) (Signed)
Acute Kidney Stone  HPI: Ann [name] is a 64 year-old female established patient who is here for further eval and management of kidney stones.  She was diagnosed with a kidney stone on approximately 08/29/2019. The patient presented to [facility] hospital with symptoms of a kidney stone.   Her pain started about approximately 08/09/2019. The pain is on the right side.   Abdomen/Pelvic CT: 2/22 - 50mm stone in mid/distal right ureter with severe hydro.   The patient relates initially having nausea, vomiting, and flank pain. She is currently having flank pain, nausea, and vomiting. She denies having back pain, groin pain, fever, chills, and voiding symptoms. She has not caught a stone in her urine strainer since her symptoms began.   She has had Ureteral Stent and Ureteroscopy for treatment of her stones in the past. This is not her first kidney stone. She has had 2 stones prior to getting this one.   The patient denies any fevers. She denies any significant voiding symptoms. She does have a history     ALLERGIES: Doxycycline    MEDICATIONS: Myrbetriq 15 tablet PO Daily  Omeprazole  Tamsulosin Hcl 0.4 mg capsule  Amlodipine Besylate 10 mg tablet Oral  Celebrex  Clonazepam 2 mg tablet Oral  Furosemide 80 mg tablet Oral  Gabapentin  Iron  Metoprolol Tartrate 75 mg tablet Oral  Oxygen  Phenergan  Polyethylene Glycol 3350 17 gram/dose powder 1 tbsp PO Daily  Promethazine Hcl 25 mg tablet 1 tablet PO Q 4 H PRN  Reglan  Topiramate 50 mg tablet Oral     GU PSH: Bladder Repair - 2014 Cysto Uretero Lithotripsy - 2015 Cystoscopy - 2017 Cystoscopy Insert Stent, Right - 05/23/2019, Right - 05/08/2019, Left - 2017, 2016, 2015, 2015 Cystoscopy Ureteroscopy, Left - 2017 Insert Mesh/pelvic Flr Addon - 2015 Repair of Rectocele - 2015 Repair Vaginal Prolapse - 2014 Sling - 2014 Ureteroscopic stone removal, Right - 05/23/2019, 2016       PSH Notes: Esophageal Dilation, Knee Arthroscopy  Dec 2018,  Part of colon removed.   NON-GU PSH: Cholecystectomy (laparoscopic) Repair Bowel Bulge - 2015 Tubal Ligation - 2014     GU PMH: Suprapubic pain - 05/16/2019 Flank Pain - 2019 Renal calculus (Stable) - 2019, Nephrolithiasis, - 2015 Ureteral calculus, She has no hydro or hematuria today but still has irritative symptoms. Her UA has WBC and bacteria. I am going to culture the urine and begin bactrim. Oxybutynin given for the urgency. She will return Monday and if the culture is negative and she is still symptomatic, she will need a CT and the ureteroscopy if the stone remains. She was encouraged to talk to her headache doctor about getting off of the topomax which may be contributing to her stones. - 03/27/2017, Left, - 03/10/2017, - 2017, Calculus of left ureter, - 2017, Calculus of distal right ureter, - 2016, Calculus of right ureter, - 2016 Rectocele (Stable) - 2017, Rectocele, female, - 2016, Rectocele, female, - 2016 Female genital prolapse, unspecified - 2017, Vaginal vault prolapse, - 2015 Urinary incontinence, Unspec - 2017 Other microscopic hematuria, Microhematuria - 2017 Ureteral obstruction secondary to calculous, Hydronephrosis with obstructing calculus - 2017 Urinary Frequency, Urinary frequency - 2016 Dorsalgia, Unspec, Mid back pain on right side - 2016 Hydronephrosis Unspec, Hydronephrosis, right - 2016, Hydronephrosis, - 2015 Chronic cystitis (w/o hematuria), Chronic cystitis - 2015 Urinary Tract Inf, Unspec site, Pyuria - 2015, Urinary tract infection, - 2015 Postmenopausal atrophic vaginitis, Atrophic vaginitis - 2015 Oth GU systems  Signs/Symptoms, Bladder pain - 2015 Pelvic/perineal pain, Vaginal pain - 2015 Stress Incontinence, Female stress incontinence - 2014 Urinary Retention, Unspec, Incomplete bladder emptying - 2014 Nocturia, Nocturia - 2014 Proteinuria, Isolated proteinuria - 2014    NON-GU PMH: Nausea - 05/16/2019 Nausea with vomiting, unspecified, Nausea with  vomiting - 2017 Encounter for general adult medical examination without abnormal findings, Encounter for preventive health examination - 2016 Other constipation, Chronic constipation - 2015 Personal history of Methicillin resistant Staphylococcus aureus infection, History of methicillin resistant Staphylococcus aureus infection - 2015 Other abnormal glucose, Elevated hemoglobin A1c - 2015 Sleep Apnea, Sleep apnea - 2015 Anxiety, Anxiety - 2014 Obstructive sleep apnea (adult) (pediatric), Obstructive sleep apnea, adult - 2014 Personal history of other diseases of the circulatory system, History of hypertension - 2014 Personal history of other diseases of the digestive system, History of gastroesophageal reflux (GERD) - 2014, History of hiatal hernia, - 2014 Personal history of other diseases of the musculoskeletal system and connective tissue, History of arthritis - 2014 Personal history of other diseases of the nervous system and sense organs, History of neuropathy - 2014 Personal history of other endocrine, nutritional and metabolic disease, History of hyperlipidemia - 2014 Personal history of transient ischemic attack (TIA), and cerebral infarction without residual deficits, History of stroke - 2014 Vitamin D deficiency, unspecified, Vitamin D deficiency - 2014    FAMILY HISTORY: Cardiac Arrest - Runs In Family cardiac disorder - Runs In Family Death - Mother, Father Deceased - Runs In Family malignant neoplasm - Runs In Family Tuberculosis - Runs In Family   SOCIAL HISTORY: Marital Status: Married Preferred Language: English; Ethnicity: Not Hispanic Or Latino; Race: White Current Smoking Status: Patient has never smoked.  Has never drank.  Drinks 2 caffeinated drinks per day.    REVIEW OF SYSTEMS:    GU Review Female:   Patient reports burning /pain with urination, stream starts and stops, trouble starting your stream, and have to strain to urinate. Patient denies frequent  urination, hard to postpone urination, get up at night to urinate, leakage of urine, and being pregnant.  Gastrointestinal (Upper):   Patient reports nausea and vomiting. Patient denies indigestion/ heartburn.  Gastrointestinal (Lower):   Patient reports diarrhea. Patient denies constipation.  Constitutional:   Patient reports night sweats. Patient denies fever, weight loss, and fatigue.  Skin:   Patient denies skin rash/ lesion and itching.  Eyes:   Patient denies blurred vision and double vision.  Ears/ Nose/ Throat:   Patient denies sore throat and sinus problems.  Hematologic/Lymphatic:   Patient denies swollen glands and easy bruising.  Cardiovascular:   Patient reports leg swelling. Patient denies chest pains.  Respiratory:   Patient denies cough and shortness of breath.  Endocrine:   Patient denies excessive thirst.  Musculoskeletal:   Patient reports back pain. Patient denies joint pain.  Neurological:   Patient reports headaches and dizziness.   Psychologic:   Patient denies depression and anxiety.   VITAL SIGNS:      08/30/2019 12:00 PM  Weight 212 lb / 96.16 kg  Height 62 in / 157.48 cm  BP 129/86 mmHg  Heart Rate 77 /min  Temperature 98.4 F / 36.8 C  BMI 38.8 kg/m   MULTI-SYSTEM PHYSICAL EXAMINATION:    Constitutional: Well-nourished. No physical deformities. Normally developed. Good grooming. She appears to be in moderate distress  Respiratory: Normal breath sounds. No labored breathing, no use of accessory muscles.   Cardiovascular: Regular rate and rhythm. No murmur, no  gallop. Normal temperature, normal extremity pulses, no swelling, no varicosities.   Gastrointestinal: No mass, no tenderness, no rigidity, non obese abdomen. Severe right CVA tenderness     PAST DATA REVIEWED:  Source Of History:  Patient  Records Review:   Previous Doctor Records, Previous Patient Records, POC Tool  Urine Test Review:   Urinalysis, Urine Culture  X-Ray Review: C.T. Abdomen/Pelvis:  Reviewed Films. Discussed With Patient.     PROCEDURES:          Urinalysis w/Scope Dipstick Dipstick Cont'd Micro  Color: Yellow Bilirubin: Neg mg/dL WBC/hpf: 20 - 40/hpf  Appearance: Clear Ketones: Neg mg/dL RBC/hpf: NS (Not Seen)  Specific Gravity: 1.025 Blood: Neg ery/uL Bacteria: Many (>50/hpf)  pH: 6.0 Protein: Trace mg/dL Cystals: NS (Not Seen)  Glucose: Neg mg/dL Urobilinogen: 0.2 mg/dL Casts: NS (Not Seen)    Nitrites: Neg Trichomonas: Not Present    Leukocyte Esterase: 1+ leu/uL Mucous: Not Present      Epithelial Cells: 6 - 10/hpf      Yeast: NS (Not Seen)      Sperm: Not Present         Ceftriaxone 1g - [id number], [id number] Qty: 1 Adm. By: Cristobal Goldmann  Unit: gram Lot No [id number]  Route: IM Exp. Date 12/05/2020  Freq: None Mfgr.:   Site: Right Buttock         Ketoralac 30mg  - [id number], [id number] Qty: 30 Adm. By: Cristobal Goldmann  Unit: mg Lot No [id number]  Route: IM Exp. Date 09/04/2020  Freq: None Mfgr.:   Site: Left Buttock   ASSESSMENT:      ICD-10 Details  1 GU:   Ureteral calculus - N20.1   2   Hydronephrosis Unspec - N13.30    PLAN:           Orders Labs Urine Culture          Document Letter(s):  Created for Patient: Clinical Summary         Notes:   The patient has a right distal ureteral stone with severe hydronephrosis. She has bacteriuria and history of urosepsis. She is having severe nausea and vomiting poorly controlled pain. As such, recommended we proceed to the operating room for ureteral stent placement.

## 2019-08-30 NOTE — Anesthesia Procedure Notes (Signed)
Procedure Name: LMA Insertion Date/Time: 08/30/2019 3:49 PM Performed by: Lollie Sails, CRNA Pre-anesthesia Checklist: Patient identified, Emergency Drugs available, Suction available, Patient being monitored and Timeout performed Patient Re-evaluated:Patient Re-evaluated prior to induction Oxygen Delivery Method: Circle system utilized Preoxygenation: Pre-oxygenation with 100% oxygen Induction Type: IV induction Ventilation: Mask ventilation without difficulty LMA: LMA inserted LMA Size: 4.0 Number of attempts: 1 Placement Confirmation: positive ETCO2 and breath sounds checked- equal and bilateral Tube secured with: Tape Dental Injury: Teeth and Oropharynx as per pre-operative assessment

## 2019-08-30 NOTE — Op Note (Signed)
Preoperative diagnosis:  1. Obstructing right ureteral stone 2. UTI   Postoperative diagnosis:  1. same   Procedure:  1. Cystoscopy 2. right ureteral stent placement 3. right retrograde pyelography with interpretation   Surgeon: Ardis Hughs, MD  Anesthesia: General  Complications: None  Intraoperative findings:  right retrograde pyelography demonstrated a filling defect within the right ureter consistent with the patient's known calculus and severe hydronephrosis without other abnormalities.  EBL: Minimal  Specimens: None  Indication: Ann [name] is a 64 y.o. patient with history of urosepsis in the past from obstrcuting ureteral stone who presented to the ED with similar pain.  She was noted to have a 3-59mm stone in the right mid ureter and was sent home.  In clinic she was complaining of dysuria.  She was having severe pain. After reviewing the management options for treatment, he elected to proceed with the above surgical procedure(s). We have discussed the potential benefits and risks of the procedure, side effects of the proposed treatment, the likelihood of the patient achieving the goals of the procedure, and any potential problems that might occur during the procedure or recuperation. Informed consent has been obtained.  Description of procedure:  The patient was taken to the operating room and general anesthesia was induced.  The patient was placed in the dorsal lithotomy position, prepped and draped in the usual sterile fashion, and preoperative antibiotics were administered. A preoperative time-out was performed.   Cystourethroscopy was performed.  The patient's urethra was examined and was normal. The bladder was then systematically examined in its entirety. There was no evidence for any bladder tumors, stones, or other mucosal pathology.    Attention then turned to the rightureteral orifice and a ureteral catheter was used to intubate the ureteral  orifice.  Omnipaque contrast was injected through the ureteral catheter and a retrograde pyelogram was performed with findings as dictated above.  A 0.38 sensor guidewire was then advanced up the right ureter into the renal pelvis under fluoroscopic guidance.  The wire was then backloaded through the cystoscope and a ureteral stent was advance over the wire using Seldinger technique.  The stent was positioned appropriately under fluoroscopic and cystoscopic guidance.  The wire was then removed with an adequate stent curl noted in the renal pelvis as well as in the bladder.  The bladder was then emptied and the procedure ended.  The patient appeared to tolerate the procedure well and without complications.  The patient was able to be awakened and transferred to the recovery unit in satisfactory condition.    Ardis Hughs, M.D.

## 2019-08-30 NOTE — Interval H&P Note (Signed)
History and Physical Interval Note:  08/30/2019 3:37 PM  Ann [name]  has presented today for surgery, with the diagnosis of right ureteral stone.  The various methods of treatment have been discussed with the patient and family. After consideration of risks, benefits and other options for treatment, the patient has consented to  Procedure(s): [place] (N/A) as a surgical intervention.  The patient's history has been reviewed, patient examined, no change in status, stable for surgery.  I have reviewed the patient's chart and labs.  Questions were answered to the patient's satisfaction.     Ardis Hughs

## 2019-08-30 NOTE — H&P (Signed)
Acute Kidney Stone  HPI: Ann [name] is a 64 year-old female established patient who is here for further eval and management of kidney stones.  She was diagnosed with a kidney stone on approximately 08/29/2019. The patient presented to [facility] hospital with symptoms of a kidney stone.   Her pain started about approximately 08/09/2019. The pain is on the right side.   Abdomen/Pelvic CT: 2/22 - 18mm stone in mid/distal right ureter with severe hydro.   The patient relates initially having nausea, vomiting, and flank pain. She is currently having flank pain, nausea, and vomiting. She denies having back pain, groin pain, fever, chills, and voiding symptoms. She has not caught a stone in her urine strainer since her symptoms began.   She has had Ureteral Stent and Ureteroscopy for treatment of her stones in the past. This is not her first kidney stone. She has had 2 stones prior to getting this one.   The patient denies any fevers. She denies any significant voiding symptoms. She does have a history     ALLERGIES: Doxycycline    MEDICATIONS: Myrbetriq 15 tablet PO Daily  Omeprazole  Tamsulosin Hcl 0.4 mg capsule  Amlodipine Besylate 10 mg tablet Oral  Celebrex  Clonazepam 2 mg tablet Oral  Furosemide 80 mg tablet Oral  Gabapentin  Iron  Metoprolol Tartrate 75 mg tablet Oral  Oxygen  Phenergan  Polyethylene Glycol 3350 17 gram/dose powder 1 tbsp PO Daily  Promethazine Hcl 25 mg tablet 1 tablet PO Q 4 H PRN  Reglan  Topiramate 50 mg tablet Oral     GU PSH: Bladder Repair - 2014 Cysto Uretero Lithotripsy - 2015 Cystoscopy - 2017 Cystoscopy Insert Stent, Right - 05/23/2019, Right - 05/08/2019, Left - 2017, 2016, 2015, 2015 Cystoscopy Ureteroscopy, Left - 2017 Insert Mesh/pelvic Flr Addon - 2015 Repair of Rectocele - 2015 Repair Vaginal Prolapse - 2014 Sling - 2014 Ureteroscopic stone removal, Right - 05/23/2019, 2016       PSH Notes: Esophageal Dilation, Knee Arthroscopy  Dec 2018,  Part of colon removed.   NON-GU PSH: Cholecystectomy (laparoscopic) Repair Bowel Bulge - 2015 Tubal Ligation - 2014     GU PMH: Suprapubic pain - 05/16/2019 Flank Pain - 2019 Renal calculus (Stable) - 2019, Nephrolithiasis, - 2015 Ureteral calculus, She has no hydro or hematuria today but still has irritative symptoms. Her UA has WBC and bacteria. I am going to culture the urine and begin bactrim. Oxybutynin given for the urgency. She will return Monday and if the culture is negative and she is still symptomatic, she will need a CT and the ureteroscopy if the stone remains. She was encouraged to talk to her headache doctor about getting off of the topomax which may be contributing to her stones. - 03/27/2017, Left, - 03/10/2017, - 2017, Calculus of left ureter, - 2017, Calculus of distal right ureter, - 2016, Calculus of right ureter, - 2016 Rectocele (Stable) - 2017, Rectocele, female, - 2016, Rectocele, female, - 2016 Female genital prolapse, unspecified - 2017, Vaginal vault prolapse, - 2015 Urinary incontinence, Unspec - 2017 Other microscopic hematuria, Microhematuria - 2017 Ureteral obstruction secondary to calculous, Hydronephrosis with obstructing calculus - 2017 Urinary Frequency, Urinary frequency - 2016 Dorsalgia, Unspec, Mid back pain on right side - 2016 Hydronephrosis Unspec, Hydronephrosis, right - 2016, Hydronephrosis, - 2015 Chronic cystitis (w/o hematuria), Chronic cystitis - 2015 Urinary Tract Inf, Unspec site, Pyuria - 2015, Urinary tract infection, - 2015 Postmenopausal atrophic vaginitis, Atrophic vaginitis - 2015 Oth GU systems  Signs/Symptoms, Bladder pain - 2015 Pelvic/perineal pain, Vaginal pain - 2015 Stress Incontinence, Female stress incontinence - 2014 Urinary Retention, Unspec, Incomplete bladder emptying - 2014 Nocturia, Nocturia - 2014 Proteinuria, Isolated proteinuria - 2014    NON-GU PMH: Nausea - 05/16/2019 Nausea with vomiting, unspecified, Nausea with  vomiting - 2017 Encounter for general adult medical examination without abnormal findings, Encounter for preventive health examination - 2016 Other constipation, Chronic constipation - 2015 Personal history of Methicillin resistant Staphylococcus aureus infection, History of methicillin resistant Staphylococcus aureus infection - 2015 Other abnormal glucose, Elevated hemoglobin A1c - 2015 Sleep Apnea, Sleep apnea - 2015 Anxiety, Anxiety - 2014 Obstructive sleep apnea (adult) (pediatric), Obstructive sleep apnea, adult - 2014 Personal history of other diseases of the circulatory system, History of hypertension - 2014 Personal history of other diseases of the digestive system, History of gastroesophageal reflux (GERD) - 2014, History of hiatal hernia, - 2014 Personal history of other diseases of the musculoskeletal system and connective tissue, History of arthritis - 2014 Personal history of other diseases of the nervous system and sense organs, History of neuropathy - 2014 Personal history of other endocrine, nutritional and metabolic disease, History of hyperlipidemia - 2014 Personal history of transient ischemic attack (TIA), and cerebral infarction without residual deficits, History of stroke - 2014 Vitamin D deficiency, unspecified, Vitamin D deficiency - 2014    FAMILY HISTORY: Cardiac Arrest - Runs In Family cardiac disorder - Runs In Family Death - Mother, Father Deceased - Runs In Family malignant neoplasm - Runs In Family Tuberculosis - Runs In Family   SOCIAL HISTORY: Marital Status: Married Preferred Language: English; Ethnicity: Not Hispanic Or Latino; Race: White Current Smoking Status: Patient has never smoked.  Has never drank.  Drinks 2 caffeinated drinks per day.    REVIEW OF SYSTEMS:    GU Review Female:   Patient reports burning /pain with urination, stream starts and stops, trouble starting your stream, and have to strain to urinate. Patient denies frequent  urination, hard to postpone urination, get up at night to urinate, leakage of urine, and being pregnant.  Gastrointestinal (Upper):   Patient reports nausea and vomiting. Patient denies indigestion/ heartburn.  Gastrointestinal (Lower):   Patient reports diarrhea. Patient denies constipation.  Constitutional:   Patient reports night sweats. Patient denies fever, weight loss, and fatigue.  Skin:   Patient denies skin rash/ lesion and itching.  Eyes:   Patient denies blurred vision and double vision.  Ears/ Nose/ Throat:   Patient denies sore throat and sinus problems.  Hematologic/Lymphatic:   Patient denies swollen glands and easy bruising.  Cardiovascular:   Patient reports leg swelling. Patient denies chest pains.  Respiratory:   Patient denies cough and shortness of breath.  Endocrine:   Patient denies excessive thirst.  Musculoskeletal:   Patient reports back pain. Patient denies joint pain.  Neurological:   Patient reports headaches and dizziness.   Psychologic:   Patient denies depression and anxiety.   VITAL SIGNS:      08/30/2019 12:00 PM  Weight 212 lb / 96.16 kg  Height 62 in / 157.48 cm  BP 129/86 mmHg  Heart Rate 77 /min  Temperature 98.4 F / 36.8 C  BMI 38.8 kg/m   MULTI-SYSTEM PHYSICAL EXAMINATION:    Constitutional: Well-nourished. No physical deformities. Normally developed. Good grooming. She appears to be in moderate distress  Respiratory: Normal breath sounds. No labored breathing, no use of accessory muscles.   Cardiovascular: Regular rate and rhythm. No murmur, no  gallop. Normal temperature, normal extremity pulses, no swelling, no varicosities.   Gastrointestinal: No mass, no tenderness, no rigidity, non obese abdomen. Severe right CVA tenderness     PAST DATA REVIEWED:  Source Of History:  Patient  Records Review:   Previous Doctor Records, Previous Patient Records, POC Tool  Urine Test Review:   Urinalysis, Urine Culture  X-Ray Review: C.T. Abdomen/Pelvis:  Reviewed Films. Discussed With Patient.     PROCEDURES:          Urinalysis w/Scope Dipstick Dipstick Cont'd Micro  Color: Yellow Bilirubin: Neg mg/dL WBC/hpf: 20 - 40/hpf  Appearance: Clear Ketones: Neg mg/dL RBC/hpf: NS (Not Seen)  Specific Gravity: 1.025 Blood: Neg ery/uL Bacteria: Many (>50/hpf)  pH: 6.0 Protein: Trace mg/dL Cystals: NS (Not Seen)  Glucose: Neg mg/dL Urobilinogen: 0.2 mg/dL Casts: NS (Not Seen)    Nitrites: Neg Trichomonas: Not Present    Leukocyte Esterase: 1+ leu/uL Mucous: Not Present      Epithelial Cells: 6 - 10/hpf      Yeast: NS (Not Seen)      Sperm: Not Present         Ceftriaxone 1g - [id number], [id number] Qty: 1 Adm. By: Cristobal Goldmann  Unit: gram Lot No [id number]  Route: IM Exp. Date 12/05/2020  Freq: None Mfgr.:   Site: Right Buttock         Ketoralac 30mg  - [id number], [id number] Qty: 30 Adm. By: Cristobal Goldmann  Unit: mg Lot No [id number]  Route: IM Exp. Date 09/04/2020  Freq: None Mfgr.:   Site: Left Buttock   ASSESSMENT:      ICD-10 Details  1 GU:   Ureteral calculus - N20.1   2   Hydronephrosis Unspec - N13.30    PLAN:           Orders Labs Urine Culture          Document Letter(s):  Created for Patient: Clinical Summary         Notes:   The patient has a right distal ureteral stone with severe hydronephrosis. She has bacteriuria and history of urosepsis. She is having severe nausea and vomiting poorly controlled pain. As such, recommended we proceed to the operating room for ureteral stent placement.

## 2019-08-30 NOTE — Progress Notes (Signed)
Called pharmacy to obtain medication reconciliation for patient prior to surgery.

## 2019-08-30 NOTE — Discharge Instructions (Signed)
DISCHARGE INSTRUCTIONS FOR KIDNEY STONE/URETERAL STENT   MEDICATIONS:  1.  Resume all your other meds from home - except do not take any extra narcotic pain meds that you may have at home.  2. Pyridium is to help with the burning/stinging when you urinate. 3. Tramadol is for moderate/severe pain, otherwise taking upto 1000 mg every 6 hours of plainTylenol will help treat your pain.   4. Take Bactrim DS as prescribed.  ACTIVITY:  1. No strenuous activity x 1week  2. No driving while on narcotic pain medications  3. Drink plenty of water  4. Continue to walk at home - you can still get blood clots when you are at home, so keep active, but don't over do it.  5. May return to work/school tomorrow or when you feel ready   BATHING:  1. You can shower and we recommend daily showers   SIGNS/SYMPTOMS TO CALL:  Please call us if you have a fever greater than 101.5, uncontrolled nausea/vomiting, uncontrolled pain, dizziness, unable to urinate, bloody urine, chest pain, shortness of breath, leg swelling, leg pain, redness around wound, drainage from wound, or any other concerns or questions.   You can reach [place] at [phone].   FOLLOW-UP:  1. You  Will be scheduled for f/u ureteroscopy in the coming week.  Someone from [facility] will contact you with the appointment.

## 2019-08-30 NOTE — Transfer of Care (Signed)
Immediate Anesthesia Transfer of Care Note  Patient: Ann [name]  Procedure(s) Performed: CYSTOSCOPY WITH STENT PLACEMENT; RIGHT RETROGRADE PYELOGRAM (N/A )  Patient Location: PACU  Anesthesia Type:General  Level of Consciousness: awake, alert , oriented and patient cooperative  Airway & Oxygen Therapy: Patient Spontanous Breathing and Patient connected to face mask oxygen  Post-op Assessment: Report given to RN and Post -op Vital signs reviewed and stable  Post vital signs: Reviewed and stable  Last Vitals:  Vitals Value Taken Time  BP    Temp    Pulse 84 08/30/19 1614  Resp 15 08/30/19 1614  SpO2 100 % 08/30/19 1614  Vitals shown include unvalidated device data.  Last Pain:  Vitals:   08/30/19 1500  TempSrc: Oral  PainSc: 2       Patients Stated Pain Goal: 3 ([id number])  Complications: No apparent anesthesia complications

## 2019-08-30 NOTE — H&P (Deleted)
Preoperative diagnosis:  1. Obstructing right ureteral stone 2. UTI   Postoperative diagnosis:  1. same   Procedure:  1. Cystoscopy 2. right ureteral stent placement 3. right retrograde pyelography with interpretation   Surgeon: Ardis Hughs, MD  Anesthesia: General  Complications: None  Intraoperative findings:  right retrograde pyelography demonstrated a filling defect within the right ureter consistent with the patient's known calculus and severe hydronephrosis without other abnormalities.  EBL: Minimal  Specimens: None  Indication: Ann [name] is a 64 y.o. patient with history of urosepsis in the past from obstrcuting ureteral stone who presented to the ED with similar pain.  She was noted to have a 3-37mm stone in the right mid ureter and was sent home.  In clinic she was complaining of dysuria.  She was having severe pain. After reviewing the management options for treatment, he elected to proceed with the above surgical procedure(s). We have discussed the potential benefits and risks of the procedure, side effects of the proposed treatment, the likelihood of the patient achieving the goals of the procedure, and any potential problems that might occur during the procedure or recuperation. Informed consent has been obtained.  Description of procedure:  The patient was taken to the operating room and general anesthesia was induced.  The patient was placed in the dorsal lithotomy position, prepped and draped in the usual sterile fashion, and preoperative antibiotics were administered. A preoperative time-out was performed.   Cystourethroscopy was performed.  The patient's urethra was examined and was normal. The bladder was then systematically examined in its entirety. There was no evidence for any bladder tumors, stones, or other mucosal pathology.    Attention then turned to the rightureteral orifice and a ureteral catheter was used to intubate the ureteral orifice.   Omnipaque contrast was injected through the ureteral catheter and a retrograde pyelogram was performed with findings as dictated above.  A 0.38 sensor guidewire was then advanced up the right ureter into the renal pelvis under fluoroscopic guidance.  The wire was then backloaded through the cystoscope and a ureteral stent was advance over the wire using Seldinger technique.  The stent was positioned appropriately under fluoroscopic and cystoscopic guidance.  The wire was then removed with an adequate stent curl noted in the renal pelvis as well as in the bladder.  The bladder was then emptied and the procedure ended.  The patient appeared to tolerate the procedure well and without complications.  The patient was able to be awakened and transferred to the recovery unit in satisfactory condition.    Ardis Hughs, M.D.

## 2019-08-31 ENCOUNTER — Encounter: Payer: Self-pay | Admitting: *Deleted

## 2019-08-31 LAB — URINE CULTURE: Culture: >=100000 — AB

## 2019-09-01 ENCOUNTER — Telehealth: Payer: Self-pay | Admitting: Emergency Medicine

## 2019-09-01 NOTE — Telephone Encounter (Signed)
Post ED Visit - Positive Culture Follow-up: Successful Patient Follow-Up  Culture assessed and recommendations reviewed by:  []  Elenor Quinones, Pharm.D. []  Heide Guile, Pharm.D., BCPS AQ-ID []  Parks Neptune, Pharm.D., BCPS []  Alycia Rossetti, Pharm.D., BCPS []  [place], [place].D., BCPS, AAHIVP []  Legrand Como, Pharm.D., BCPS, AAHIVP []  Salome Arnt, PharmD, BCPS []  Johnnette Gourd, PharmD, BCPS []  Hughes Better, PharmD, BCPS []  Leeroy Cha, PharmD  Positive urine culture  [x]  Patient discharged without antimicrobial prescription and treatment is now indicated []  Organism is resistant to prescribed ED discharge antimicrobial []  Patient with positive blood cultures  Changes discussed with ED provider: Benedetto Goad PA New antibiotic prescription start Macrobid 100mg  po bid x 5 days Called to [facility] [address]  [place] patient,    Ann [name] 09/01/2019, 1:37 PM

## 2019-09-01 NOTE — Progress Notes (Signed)
ED Antimicrobial Stewardship Positive Culture Follow Up   Ann [name] is an 64 y.o. female who presented to [facility] on 08/29/2019 with a chief complaint of  Chief Complaint  Patient presents with  . Hip Pain  . Emesis  . Diarrhea    Recent Results (from the past 720 hour(s))  Urine culture     Status: Abnormal   Collection Time: 08/29/19 11:12 AM   Specimen: Urine, Random  Result Value Ref Range Status   Specimen Description   Final    URINE, RANDOM Performed at [facility] [address], [place], [place] [address]    Special Requests   Final    NONE Performed at [facility], [place] [address] [place], [place] [address]    Culture >=100,000 COLONIES/mL ENTEROCOCCUS FAECALIS (A)  Final   Report Status 08/31/2019 FINAL  Final   Organism ID, Bacteria ENTEROCOCCUS FAECALIS (A)  Final      Susceptibility   Enterococcus faecalis - MIC*    AMPICILLIN <=2 SENSITIVE Sensitive     NITROFURANTOIN <=16 SENSITIVE Sensitive     VANCOMYCIN 2 SENSITIVE Sensitive     * >=100,000 COLONIES/mL ENTEROCOCCUS FAECALIS  Respiratory Panel by RT PCR (Flu A&B, Covid) - Nasopharyngeal Swab     Status: None   Collection Time: 08/30/19  2:20 PM   Specimen: Nasopharyngeal Swab  Result Value Ref Range Status   SARS Coronavirus 2 by RT PCR NEGATIVE NEGATIVE Final    Comment: (NOTE) SARS-CoV-2 target nucleic acids are NOT DETECTED. The SARS-CoV-2 RNA is generally detectable in upper respiratoy specimens during the acute phase of infection. The lowest concentration of SARS-CoV-2 viral copies this assay can detect is 131 copies/mL. A negative result does not preclude SARS-Cov-2 infection and should not be used as the sole basis for treatment or other patient management decisions. A negative result may occur with  improper specimen collection/handling, submission of specimen other than nasopharyngeal swab, presence of viral mutation(s) within the areas targeted  by this assay, and inadequate number of viral copies (<131 copies/mL). A negative result must be combined with clinical observations, patient history, and epidemiological information. The expected result is Negative. Fact Sheet for Patients:  PinkCheek.be Fact Sheet for Healthcare Providers:  GravelBags.it This test is not yet ap proved or cleared by the [place] FDA and  has been authorized for detection and/or diagnosis of SARS-CoV-2 by FDA under an Emergency Use Authorization (EUA). This EUA will remain  in effect (meaning this test can be used) for the duration of the COVID-19 declaration under Section 564(b)(1) of the Act, 21 U.S.C. section 360bbb-3(b)(1), unless the authorization is terminated or revoked sooner.    Influenza A by PCR NEGATIVE NEGATIVE Final   Influenza B by PCR NEGATIVE NEGATIVE Final    Comment: (NOTE) The Xpert Xpress SARS-CoV-2/FLU/RSV assay is intended as an aid in  the diagnosis of influenza from Nasopharyngeal swab specimens and  should not be used as a sole basis for treatment. Nasal washings and  aspirates are unacceptable for Xpert Xpress SARS-CoV-2/FLU/RSV  testing. Fact Sheet for Patients: PinkCheek.be Fact Sheet for Healthcare Providers: GravelBags.it This test is not yet approved or cleared by the [place] FDA and  has been authorized for detection and/or diagnosis of SARS-CoV-2 by  FDA under an Emergency Use Authorization (EUA). This EUA will remain  in effect (meaning this test can be used) for the duration of the  Covid-19 declaration under Section 564(b)(1) of the Act,  21  U.S.C. section 360bbb-3(b)(1), unless the authorization is  terminated or revoked. Performed at [facility], [place] [address], [place], [place] [address]     [x]  Treated with Septra, organism resistant to prescribed  antimicrobial []  Patient discharged originally without antimicrobial agent and treatment is now indicated  New antibiotic prescription:   - Please have patient contact Dr. Carlton Adam office to determine which antibiotic urology office prefers to prescribe   ED Provider: Benedetto Goad, PA     Royetta Asal, PharmD, BCPS 09/01/2019 11:35 AM

## 2019-09-02 ENCOUNTER — Encounter: Payer: Self-pay | Admitting: Anesthesiology

## 2019-09-02 NOTE — Anesthesia Postprocedure Evaluation (Signed)
Anesthesia Post Note  Patient: Ann [name]  Procedure(s) Performed: CYSTOSCOPY WITH STENT PLACEMENT; RIGHT RETROGRADE PYELOGRAM (N/A )     Patient location during evaluation: PACU Anesthesia Type: General Level of consciousness: awake and alert Pain management: pain level controlled Vital Signs Assessment: post-procedure vital signs reviewed and stable Respiratory status: spontaneous breathing, nonlabored ventilation, respiratory function stable and patient connected to nasal cannula oxygen Cardiovascular status: blood pressure returned to baseline and stable Postop Assessment: no apparent nausea or vomiting Anesthetic complications: no    Last Vitals:  Vitals:   08/30/19 1645 08/30/19 1700  BP: 118/69 125/70  Pulse: 78 73  Resp: 16 16  Temp:    SpO2: 99% 98%    Last Pain:  Vitals:   08/30/19 1700  TempSrc:   PainSc: 0-No pain   Pain Goal: Patients Stated Pain Goal: 3 (08/30/19 1500)                 Bryse Blanchette S

## 2019-09-02 NOTE — Anesthesia Preprocedure Evaluation (Addendum)
Anesthesia Evaluation  Patient identified by MRN, date of birth, ID band Patient awake    Reviewed: Allergy & Precautions, H&P , NPO status , Patient's Chart, lab work & pertinent test results  Airway Mallampati: II   Neck ROM: full    Dental   Pulmonary shortness of breath, sleep apnea , COPD,    breath sounds clear to auscultation       Cardiovascular hypertension, +CHF   Rhythm:regular Rate:Normal     Neuro/Psych  Headaches, Anxiety  Neuromuscular disease CVA    GI/Hepatic hiatal hernia, GERD  ,  Endo/Other    Renal/GU Renal disease     Musculoskeletal  (+) Arthritis ,   Abdominal   Peds  Hematology  (+) Blood dyscrasia, anemia ,   Anesthesia Other Findings   Reproductive/Obstetrics                             Anesthesia Physical Anesthesia Plan  ASA: III  Anesthesia Plan: General   Post-op Pain Management:    Induction: Intravenous  PONV Risk Score and Plan: 3 and Ondansetron, Dexamethasone, Midazolam and Treatment may vary due to age or medical condition  Airway Management Planned: LMA  Additional Equipment:   Intra-op Plan:   Post-operative Plan: Extubation in OR  Informed Consent: I have reviewed the patients History and Physical, chart, labs and discussed the procedure including the risks, benefits and alternatives for the proposed anesthesia with the patient or authorized representative who has indicated his/her understanding and acceptance.       Plan Discussed with: CRNA, Anesthesiologist and Surgeon  Anesthesia Plan Comments:         Anesthesia Quick Evaluation

## 2019-09-05 ENCOUNTER — Encounter (HOSPITAL_COMMUNITY): Payer: Self-pay

## 2019-09-05 ENCOUNTER — Encounter (HOSPITAL_COMMUNITY)
Admission: RE | Admit: 2019-09-05 | Discharge: 2019-09-05 | Disposition: A | Discharge status: Home / Self Care | Payer: Medicaid Other | Source: Ambulatory Visit | Attending: Urology | Admitting: Urology

## 2019-09-05 ENCOUNTER — Other Ambulatory Visit: Payer: Self-pay | Admitting: Urology

## 2019-09-05 ENCOUNTER — Other Ambulatory Visit: Payer: Self-pay

## 2019-09-05 DIAGNOSIS — Z01812 Encounter for preprocedural laboratory examination: Secondary | ICD-10-CM | POA: Insufficient documentation

## 2019-09-05 DIAGNOSIS — Z01810 Encounter for preprocedural cardiovascular examination: Secondary | ICD-10-CM | POA: Insufficient documentation

## 2019-09-05 NOTE — Patient Instructions (Addendum)
DUE TO COVID-19 ONLY ONE VISITOR IS ALLOWED TO COME WITH YOU AND STAY IN THE WAITING ROOM ONLY DURING PRE OP AND PROCEDURE DAY OF SURGERY. THE 1 VISITOR MAY VISIT WITH YOU AFTER SURGERY IN YOUR PRIVATE ROOM DURING VISITING HOURS ONLY!  YOU NEED TO HAVE A COVID 19 TEST ON_Tuesday 03/02/2021______ @__  0950 am_____, THIS TEST MUST BE DONE BEFORE SURGERY, COME  [address], [place]  , [address].  ([place]) ONCE YOUR COVID TEST IS COMPLETED, PLEASE BEGIN THE QUARANTINE INSTRUCTIONS AS OUTLINED IN YOUR HANDOUT.                Ann [name]     Your procedure is scheduled on: Thursday 09/08/2019   Report to [facility] Main  Entrance    Report to admitting at  0750  AM     Call this number if you have problems the morning of surgery [phone]    Remember: Do not eat food or drink liquids :After Midnight.     BRUSH YOUR TEETH MORNING OF SURGERY AND RINSE YOUR MOUTH OUT, NO CHEWING GUM CANDY OR MINTS.     Take these medicines the morning of surgery with A SIP OF WATER: Topiramate (Topamax), Amlodipine (Norvasc), Omeprazole (Prilosec)                                 You may not have any metal on your body including hair pins and              piercings  Do not wear jewelry, make-up, lotions, powders or perfumes, deodorant             Do not wear nail polish on your fingernails.  Do not shave  48 hours prior to surgery.     Do not bring valuables to the hospital. [place].  Contacts, dentures or bridgework may not be worn into surgery.  Leave suitcase in the car. After surgery it may be brought to your room.     Patients discharged the day of surgery will not be allowed to drive home. IF YOU ARE HAVING SURGERY AND GOING HOME THE SAME DAY, YOU MUST HAVE AN ADULT TO DRIVE YOU HOME AND  BE WITH YOU FOR 24 HOURS. YOU MAY GO HOME BY TAXI OR UBER OR ORTHERWISE, BUT AN ADULT MUST ACCOMPANY YOU HOME AND STAY  WITH YOU FOR 24 HOURS.  Name and phone number of your driver:daughter- Levada Dy  Cell-[phone]               Please read over the following fact sheets you were given: _____________________________________________________________________             [facility] - Preparing for Surgery Before surgery, you can play an important role.  Because skin is not sterile, your skin needs to be as free of germs as possible.  You can reduce the number of germs on your skin by washing with CHG (chlorahexidine gluconate) soap before surgery.  CHG is an antiseptic cleaner which kills germs and bonds with the skin to continue killing germs even after washing. Please DO NOT use if you have an allergy to CHG or antibacterial soaps.  If your skin becomes reddened/irritated stop using the CHG and inform your nurse when you arrive at Short Stay. Do  not shave (including legs and underarms) for at least 48 hours prior to the first CHG shower.  You may shave your face/neck. Please follow these instructions carefully:  1.  Shower with CHG Soap the night before surgery and the  morning of Surgery.  2.  If you choose to wash your hair, wash your hair first as usual with your  normal  shampoo.  3.  After you shampoo, rinse your hair and body thoroughly to remove the  shampoo.                           4.  Use CHG as you would any other liquid soap.  You can apply chg directly  to the skin and wash                       Gently with a scrungie or clean washcloth.  5.  Apply the CHG Soap to your body ONLY FROM THE NECK DOWN.   Do not use on face/ open                           Wound or open sores. Avoid contact with eyes, ears mouth and genitals (private parts).                       Wash face,  Genitals (private parts) with your normal soap.             6.  Wash thoroughly, paying special attention to the area where your surgery  will be performed.  7.  Thoroughly rinse your body with warm water from the neck down.  8.   DO NOT shower/wash with your normal soap after using and rinsing off  the CHG Soap.                9.  Pat yourself dry with a clean towel.            10.  Wear clean pajamas.            11.  Place clean sheets on your bed the night of your first shower and do not  sleep with pets. Day of Surgery : Do not apply any lotions/deodorants the morning of surgery.  Please wear clean clothes to the hospital/surgery center.  FAILURE TO FOLLOW THESE INSTRUCTIONS MAY RESULT IN THE CANCELLATION OF YOUR SURGERY PATIENT SIGNATURE_________________________________  NURSE SIGNATURE__________________________________  ________________________________________________________________________

## 2019-09-05 NOTE — Progress Notes (Signed)
PCP -Eldridge Abrahams, NP  LOV-09/01/2019 epic  Cardiologist - n/a 08/29/2019- CT abd.pelvis w/contrast epic 01/14/2017- CT coronary morph w/CTA COR w/scoring  Chest x-ray - 03/22/2019 epic EKG - 09/06/2019  epic Stress Test - n/a ECHO - 01/12/2017  epic Cardiac Cath - n/a  Sleep Study - years ago  CPAP - yes with 4 liters oxygen nightly  Fasting Blood Sugar - n/a Checks Blood Sugar __0___ times a day  Blood Thinner Instructions:n/a Aspirin Instructions:Aspirin EC 81 mg. Stopped month ago as keep having surgeries Last Dose:month ago  Anesthesia review:  Chart to be reviewed by Iver Nestle, PA.    Patient has a history of HTN, stroke (1980, 2008, 2012),CHF, borderline diabetes, CKD-stage 2 ,COPD, OSA with sleep apnea, kidney stones and neuropathy.  Patient denies shortness of breath, fever, cough and chest pain at PAT appointment   Patient verbalized understanding of instructions that were given to them at the PAT appointment. Patient was also instructed that they will need to review over the PAT instructions again at home before surgery.

## 2019-09-06 ENCOUNTER — Encounter (HOSPITAL_COMMUNITY)
Admission: RE | Admit: 2019-09-06 | Discharge: 2019-09-06 | Disposition: A | Discharge status: Home / Self Care | Payer: Medicaid Other | Source: Ambulatory Visit | Attending: Urology | Admitting: Urology

## 2019-09-06 ENCOUNTER — Other Ambulatory Visit: Payer: Self-pay

## 2019-09-06 ENCOUNTER — Other Ambulatory Visit (HOSPITAL_COMMUNITY)
Admission: RE | Admit: 2019-09-06 | Discharge: 2019-09-06 | Disposition: A | Discharge status: Home / Self Care | Payer: Medicaid Other | Source: Ambulatory Visit | Attending: Urology | Admitting: Urology

## 2019-09-06 DIAGNOSIS — Z01810 Encounter for preprocedural cardiovascular examination: Secondary | ICD-10-CM | POA: Diagnosis not present

## 2019-09-06 DIAGNOSIS — Z01812 Encounter for preprocedural laboratory examination: Secondary | ICD-10-CM | POA: Diagnosis not present

## 2019-09-06 DIAGNOSIS — Z20822 Contact with and (suspected) exposure to covid-19: Secondary | ICD-10-CM | POA: Diagnosis not present

## 2019-09-06 LAB — SURGICAL PCR SCREEN
MRSA, PCR: NEGATIVE
Staphylococcus aureus: POSITIVE — AB

## 2019-09-06 LAB — BASIC METABOLIC PANEL
Anion gap: 10 (ref 5–15)
BUN: 13 mg/dL (ref 8–23)
CO2: 21 mmol/L — ABNORMAL LOW (ref 22–32)
Calcium: 8.5 mg/dL — ABNORMAL LOW (ref 8.9–10.3)
Chloride: 107 mmol/L (ref 98–111)
Creatinine, Ser: 1 mg/dL (ref 0.44–1.00)
GFR calc Af Amer: >60 mL/min (ref >60)
GFR calc non Af Amer: >60 mL/min (ref >60)
Glucose, Bld: 112 mg/dL — ABNORMAL HIGH (ref 70–99)
Potassium: 3.6 mmol/L (ref 3.5–5.1)
Sodium: 138 mmol/L (ref 135–145)

## 2019-09-06 LAB — CBC
HCT: 42 % (ref 36.0–46.0)
Hemoglobin: 13.1 g/dL (ref 12.0–15.0)
MCH: 30.4 pg (ref 26.0–34.0)
MCHC: 31.2 g/dL (ref 30.0–36.0)
MCV: 97.4 fL (ref 80.0–100.0)
Platelets: 354 10*3/uL (ref 150–400)
RBC: 4.31 MIL/uL (ref 3.87–5.11)
RDW: 13.7 % (ref 11.5–15.5)
WBC: 4.8 10*3/uL (ref 4.0–10.5)
nRBC: 0 % (ref 0.0–0.2)

## 2019-09-06 LAB — SARS CORONAVIRUS 2 (TAT 6-24 HRS): SARS Coronavirus 2: NEGATIVE

## 2019-09-06 LAB — HEMOGLOBIN A1C
Hgb A1c MFr Bld: 5.6 % (ref 4.8–5.6)
Mean Plasma Glucose: 114.02 mg/dL

## 2019-09-08 ENCOUNTER — Ambulatory Visit (HOSPITAL_COMMUNITY): Payer: Medicaid Other

## 2019-09-08 ENCOUNTER — Encounter (HOSPITAL_COMMUNITY): Payer: Self-pay | Admitting: Urology

## 2019-09-08 ENCOUNTER — Ambulatory Visit (HOSPITAL_COMMUNITY)
Admission: RE | Admit: 2019-09-08 | Discharge: 2019-09-08 | Disposition: A | Discharge status: Home / Self Care | Payer: Medicaid Other | Attending: Urology | Admitting: Urology

## 2019-09-08 ENCOUNTER — Ambulatory Visit (HOSPITAL_COMMUNITY): Payer: Medicaid Other | Admitting: Anesthesiology

## 2019-09-08 ENCOUNTER — Ambulatory Visit (HOSPITAL_COMMUNITY): Payer: Medicaid Other | Admitting: Physician Assistant

## 2019-09-08 ENCOUNTER — Encounter (HOSPITAL_COMMUNITY)
Admission: RE | Disposition: A | Discharge status: Home / Self Care | Payer: Self-pay | Source: Home / Self Care | Attending: Urology

## 2019-09-08 DIAGNOSIS — N132 Hydronephrosis with renal and ureteral calculous obstruction: Secondary | ICD-10-CM | POA: Diagnosis not present

## 2019-09-08 DIAGNOSIS — Z79899 Other long term (current) drug therapy: Secondary | ICD-10-CM | POA: Diagnosis not present

## 2019-09-08 DIAGNOSIS — N201 Calculus of ureter: Secondary | ICD-10-CM | POA: Diagnosis present

## 2019-09-08 DIAGNOSIS — G4730 Sleep apnea, unspecified: Secondary | ICD-10-CM | POA: Insufficient documentation

## 2019-09-08 DIAGNOSIS — I509 Heart failure, unspecified: Secondary | ICD-10-CM | POA: Insufficient documentation

## 2019-09-08 DIAGNOSIS — K219 Gastro-esophageal reflux disease without esophagitis: Secondary | ICD-10-CM | POA: Insufficient documentation

## 2019-09-08 DIAGNOSIS — J449 Chronic obstructive pulmonary disease, unspecified: Secondary | ICD-10-CM | POA: Diagnosis not present

## 2019-09-08 DIAGNOSIS — M1990 Unspecified osteoarthritis, unspecified site: Secondary | ICD-10-CM | POA: Insufficient documentation

## 2019-09-08 DIAGNOSIS — Z87440 Personal history of urinary (tract) infections: Secondary | ICD-10-CM | POA: Diagnosis not present

## 2019-09-08 DIAGNOSIS — I110 Hypertensive heart disease with heart failure: Secondary | ICD-10-CM | POA: Diagnosis not present

## 2019-09-08 DIAGNOSIS — Z8673 Personal history of transient ischemic attack (TIA), and cerebral infarction without residual deficits: Secondary | ICD-10-CM | POA: Diagnosis not present

## 2019-09-08 DIAGNOSIS — F419 Anxiety disorder, unspecified: Secondary | ICD-10-CM | POA: Insufficient documentation

## 2019-09-08 HISTORY — PX: CYSTOSCOPY/URETEROSCOPY/HOLMIUM LASER/STENT PLACEMENT: SHX6546

## 2019-09-08 SURGERY — CYSTOSCOPY/URETEROSCOPY/HOLMIUM LASER/STENT PLACEMENT
Anesthesia: General | Laterality: Right

## 2019-09-08 MED ORDER — BELLADONNA ALKALOIDS-OPIUM 16.2-30 MG RE SUPP
RECTAL | Status: AC
  Filled 2019-09-08: qty 1

## 2019-09-08 MED ORDER — LIDOCAINE 2% (20 MG/ML) 5 ML SYRINGE
INTRAMUSCULAR | Status: AC
  Filled 2019-09-08: qty 5

## 2019-09-08 MED ORDER — PROMETHAZINE HCL 25 MG/ML IJ SOLN: 6.2500 mg | INTRAMUSCULAR | Status: DC | PRN

## 2019-09-08 MED ORDER — FENTANYL CITRATE (PF) 100 MCG/2ML IJ SOLN
INTRAMUSCULAR | Status: AC
  Filled 2019-09-08: qty 2

## 2019-09-08 MED ORDER — OXYCODONE HCL 5 MG/5ML PO SOLN: 5.0000 mg | Freq: Once | ORAL | Status: DC | PRN

## 2019-09-08 MED ORDER — PROPOFOL 10 MG/ML IV BOLUS
INTRAVENOUS | Status: AC
  Filled 2019-09-08: qty 20

## 2019-09-08 MED ORDER — LACTATED RINGERS IV SOLN: INTRAVENOUS | Status: DC

## 2019-09-08 MED ORDER — MIDAZOLAM HCL 2 MG/2ML IJ SOLN
INTRAMUSCULAR | Status: AC
  Filled 2019-09-08: qty 2

## 2019-09-08 MED ORDER — CIPROFLOXACIN HCL 500 MG PO TABS: 500.0000 mg | ORAL_TABLET | Freq: Two times a day (BID) | ORAL | 0 refills | Status: DC

## 2019-09-08 MED ORDER — HYDROMORPHONE HCL 1 MG/ML IJ SOLN: 0.2500 mg | INTRAMUSCULAR | Status: DC | PRN

## 2019-09-08 MED ORDER — PROPOFOL 10 MG/ML IV BOLUS
INTRAVENOUS | Status: DC | PRN
  Administered 2019-09-08: 200 mg via INTRAVENOUS

## 2019-09-08 MED ORDER — BELLADONNA ALKALOIDS-OPIUM 16.2-60 MG RE SUPP
RECTAL | Status: DC | PRN
  Administered 2019-09-08: 1 via RECTAL

## 2019-09-08 MED ORDER — SODIUM CHLORIDE 0.9 % IR SOLN
Status: DC | PRN
  Administered 2019-09-08: 3000 mL via INTRAVESICAL

## 2019-09-08 MED ORDER — IOHEXOL 300 MG/ML  SOLN
INTRAMUSCULAR | Status: DC | PRN
  Administered 2019-09-08: 10 mL via URETHRAL

## 2019-09-08 MED ORDER — DEXAMETHASONE SODIUM PHOSPHATE 10 MG/ML IJ SOLN
INTRAMUSCULAR | Status: DC | PRN
  Administered 2019-09-08: 8 mg via INTRAVENOUS

## 2019-09-08 MED ORDER — TRAMADOL HCL 50 MG PO TABS: 50.0000 mg | ORAL_TABLET | Freq: Four times a day (QID) | ORAL | 0 refills | Status: DC | PRN

## 2019-09-08 MED ORDER — MIDAZOLAM HCL 5 MG/5ML IJ SOLN
INTRAMUSCULAR | Status: DC | PRN
  Administered 2019-09-08: 2 mg via INTRAVENOUS

## 2019-09-08 MED ORDER — OXYCODONE HCL 5 MG PO TABS: 5.0000 mg | ORAL_TABLET | Freq: Once | ORAL | Status: DC | PRN

## 2019-09-08 MED ORDER — CIPROFLOXACIN IN D5W 400 MG/200ML IV SOLN
400.0000 mg | INTRAVENOUS | Status: AC
  Administered 2019-09-08: 400 mg via INTRAVENOUS
  Filled 2019-09-08: qty 200

## 2019-09-08 MED ORDER — ONDANSETRON HCL 4 MG/2ML IJ SOLN
INTRAMUSCULAR | Status: DC | PRN
  Administered 2019-09-08: 4 mg via INTRAVENOUS

## 2019-09-08 MED ORDER — 0.9 % SODIUM CHLORIDE (POUR BTL) OPTIME
TOPICAL | Status: DC | PRN
  Administered 2019-09-08: 1000 mL

## 2019-09-08 MED ORDER — PHENAZOPYRIDINE HCL 200 MG PO TABS: 200.0000 mg | ORAL_TABLET | Freq: Three times a day (TID) | ORAL | 0 refills | Status: DC | PRN

## 2019-09-08 MED ORDER — FENTANYL CITRATE (PF) 100 MCG/2ML IJ SOLN
INTRAMUSCULAR | Status: DC | PRN
  Administered 2019-09-08 (×3): 50 ug via INTRAVENOUS

## 2019-09-08 MED ORDER — ONDANSETRON HCL 4 MG/2ML IJ SOLN
INTRAMUSCULAR | Status: AC
  Filled 2019-09-08: qty 2

## 2019-09-08 MED ORDER — DEXAMETHASONE SODIUM PHOSPHATE 10 MG/ML IJ SOLN
INTRAMUSCULAR | Status: AC
  Filled 2019-09-08: qty 1

## 2019-09-08 SURGICAL SUPPLY — 19 items
BAG URO CATCHER STRL LF (MISCELLANEOUS) ×3 IMPLANT
BASKET ZERO TIP NITINOL 2.4FR (BASKET) IMPLANT
CATH URET 5FR 28IN OPEN ENDED (CATHETERS) ×3 IMPLANT
CLOTH BEACON ORANGE TIMEOUT ST (SAFETY) IMPLANT
EXTRACTOR STONE 1.7FRX115CM (UROLOGICAL SUPPLIES) ×3 IMPLANT
GLOVE BIOGEL M STRL SZ7.5 (GLOVE) ×3 IMPLANT
GOWN STRL REUS W/TWL XL LVL3 (GOWN DISPOSABLE) ×3 IMPLANT
GUIDEWIRE ANG ZIPWIRE 038X150 (WIRE) IMPLANT
GUIDEWIRE STR DUAL SENSOR (WIRE) ×3 IMPLANT
KIT TURNOVER KIT A (KITS) ×3 IMPLANT
MANIFOLD NEPTUNE II (INSTRUMENTS) ×3 IMPLANT
PACK CYSTO (CUSTOM PROCEDURE TRAY) ×3 IMPLANT
SHEATH URETERAL 12FRX28CM (UROLOGICAL SUPPLIES) IMPLANT
SHEATH URETERAL 12FRX35CM (MISCELLANEOUS) IMPLANT
STENT URET 6FRX24 CONTOUR (STENTS) ×3 IMPLANT
TUBING CONNECTING 10 (TUBING) ×2 IMPLANT
TUBING CONNECTING 10' (TUBING) ×1
TUBING UROLOGY SET (TUBING) ×6 IMPLANT
WIRE COONS/BENSON .038X145CM (WIRE) IMPLANT

## 2019-09-08 NOTE — Anesthesia Preprocedure Evaluation (Addendum)
Anesthesia Evaluation  Patient identified by MRN, date of birth, ID band Patient awake    Reviewed: Allergy & Precautions, H&P , NPO status , Patient's Chart, lab work & pertinent test results  Airway Mallampati: II  TM Distance: >3 FB Neck ROM: full    Dental no notable dental hx.    Pulmonary shortness of breath, sleep apnea , COPD,    Pulmonary exam normal breath sounds clear to auscultation       Cardiovascular hypertension, +CHF  Normal cardiovascular exam Rhythm:regular Rate:Normal     Neuro/Psych  Headaches, Anxiety  Neuromuscular disease CVA    GI/Hepatic hiatal hernia, GERD  ,  Endo/Other    Renal/GU Renal disease     Musculoskeletal  (+) Arthritis ,   Abdominal   Peds  Hematology  (+) Blood dyscrasia, anemia ,   Anesthesia Other Findings   Reproductive/Obstetrics                             Anesthesia Physical  Anesthesia Plan  ASA: III  Anesthesia Plan: General   Post-op Pain Management:    Induction: Intravenous  PONV Risk Score and Plan: 3 and Ondansetron, Dexamethasone, Midazolam and Treatment may vary due to age or medical condition  Airway Management Planned: LMA  Additional Equipment:   Intra-op Plan:   Post-operative Plan: Extubation in OR  Informed Consent: I have reviewed the patients History and Physical, chart, labs and discussed the procedure including the risks, benefits and alternatives for the proposed anesthesia with the patient or authorized representative who has indicated his/her understanding and acceptance.       Plan Discussed with: CRNA, Anesthesiologist and Surgeon  Anesthesia Plan Comments:         Anesthesia Quick Evaluation

## 2019-09-08 NOTE — Discharge Instructions (Signed)
DISCHARGE INSTRUCTIONS FOR KIDNEY STONE/URETERAL STENT   MEDICATIONS:  1.  Resume all your other meds from home - except do not take any extra narcotic pain meds that you may have at home.  2. Pyridium is to help with the burning/stinging when you urinate. 3. Tramadol is for moderate/severe pain, otherwise taking upto 1000 mg every 6 hours of plainTylenol will help treat your pain.   4. Take Cipro one hour prior to removal of your stent.   ACTIVITY:  1. No strenuous activity x 1week  2. No driving while on narcotic pain medications  3. Drink plenty of water  4. Continue to walk at home - you can still get blood clots when you are at home, so keep active, but don't over do it.  5. May return to work/school tomorrow or when you feel ready   BATHING:  1. You can shower and we recommend daily showers  2. You have a string coming from your urethra: The stent string is attached to your ureteral stent. Do not pull on this.   SIGNS/SYMPTOMS TO CALL:  Please call us if you have a fever greater than 101.5, uncontrolled nausea/vomiting, uncontrolled pain, dizziness, unable to urinate, bloody urine, chest pain, shortness of breath, leg swelling, leg pain, redness around wound, drainage from wound, or any other concerns or questions.   You can reach [place] at [phone].   FOLLOW-UP:  1. You have an appointment in 6 weeks with a ultrasound of your kidneys prior.   2. You have a string attached to your stent, you may remove it on Monday, March 8th. To do this, pull the strings until the stents are completely removed. You may feel an odd sensation in your back.

## 2019-09-08 NOTE — Interval H&P Note (Signed)
History and Physical Interval Note:  09/08/2019 11:13 AM  Ann [name]  has presented today for surgery, with the diagnosis of right ureteral stone.  The various methods of treatment have been discussed with the patient and family. After consideration of risks, benefits and other options for treatment, the patient has consented to  Procedure(s): CYSTOSCOPY/URETEROSCOPY/HOLMIUM LASER/STENT PLACEMENT (Right) as a surgical intervention.  The patient's history has been reviewed, patient examined, no change in status, stable for surgery.  I have reviewed the patient's chart and labs.  Questions were answered to the patient's satisfaction.     Ardis Hughs

## 2019-09-08 NOTE — Anesthesia Postprocedure Evaluation (Signed)
Anesthesia Post Note  Patient: Ann [name]  Procedure(s) Performed: CYSTOSCOPY/URETEROSCOPY/STENT EXCHANGE (Right )     Patient location during evaluation: PACU Anesthesia Type: General Level of consciousness: awake and alert Pain management: pain level controlled Vital Signs Assessment: post-procedure vital signs reviewed and stable Respiratory status: spontaneous breathing, nonlabored ventilation and respiratory function stable Cardiovascular status: blood pressure returned to baseline and stable Postop Assessment: no apparent nausea or vomiting Anesthetic complications: no    Last Vitals:  Vitals:   09/08/19 1240 09/08/19 1300  BP: (!) 132/92 130/88  Pulse: 92 90  Resp: 18 18  Temp: (!) 36.4 C (!) 36.3 C  SpO2: 98% 99%    Last Pain:  Vitals:   09/08/19 1240  TempSrc:   PainSc: [place]

## 2019-09-08 NOTE — Op Note (Signed)
Preoperative diagnosis: right ureteral calculus  Postoperative diagnosis: right ureteral calculus  Procedure:  1. Cystoscopy 2. right ureteroscopy and stone removal 3. right 68F x 24cm ureteral stent exchange 4. right retrograde pyelography with interpretation  Surgeon: Ardis Hughs, MD  Anesthesia: General  Complications: None  Intraoperative findings: Right retrograde pyelography demonstrated a filling defect within the right ureter consistent with the patient's known calculus without other abnormalities.  EBL: Minimal  Specimens: 1. right ureteral calculus  Disposition of specimens: [facility] for stone analysis  Indication: Ann [name] is a 64 y.o.   patient with a right ureteral stone, which was determined to be associated with a urinary tract infection.  As such a stent was placed last week urgently and she was put on antibiotics.  She presents today for removal of the stone. After reviewing the management options for treatment, the patient elected to proceed with the above surgical procedure(s). We have discussed the potential benefits and risks of the procedure, side effects of the proposed treatment, the likelihood of the patient achieving the goals of the procedure, and any potential problems that might occur during the procedure or recuperation. Informed consent has been obtained.   Description of procedure:  The patient was taken to the operating room and general anesthesia was induced.  The patient was placed in the dorsal lithotomy position, prepped and draped in the usual sterile fashion, and preoperative antibiotics were administered. A preoperative time-out was performed.   Cystourethroscopy was performed.  The patient's urethra was examined and was normal.  The patient had some sediment within her bladder, but no gross evidence of ongoing infection.  The bladder was then systematically examined in its entirety. There was no evidence for any  bladder tumors, stones, or other mucosal pathology.    The stent emanating from her right ureteral orifice was then brought back to the urethral meatus and a wire was advanced up through the stent and into the right renal pelvis.  The stent was removed over the wire.  I then drained the patient's bladder and used a semirigid ureteroscope to advance it up through the patient's right ureter and into the proximal ureter.  I then slowly pulled back and encountered the stone in the mid ureter.  I grasped this with an N-gage basket and atraumatically was able to remove it without laser fragmentation.  I then opted to replace the patient's stent.  As such, I advanced a 5 [place] open-ended ureteral catheter over the wire and remove the wire so that I could perform a retrograde pyelogram.  This demonstrated a normal caliber ureter but she did have some fullness in the renal pelvis and some blunting.  There are no other abnormalities.  I advanced the wire back up through the 5 [place] open-ended catheter and using the Seldinger technique advanced the stent over the wire under fluoroscopic guidance.  Once the stent was noted to be well within the renal pelvis I slowly backed out the wire and advanced it to her urethral meatus.  Once the wire was out the curl popped into her bladder.  I put the beak of the cystoscope and drained her bladder.  Fluoroscopy demonstrated a nice curl within her bladder as well.  The bladder was then emptied and the procedure ended.  The patient appeared to tolerate the procedure well and without complications.  The patient was able to be awakened and transferred to the recovery unit in satisfactory condition.   Disposition: The tether of  the stent was left on and tucked inside the patient's vagina.  Instructions for removing the stent have been provided to the patient. The patient has been scheduled for followup in 6 weeks with a renal ultrasound.

## 2019-09-08 NOTE — Transfer of Care (Signed)
Immediate Anesthesia Transfer of Care Note  Patient: Ann [name]  Procedure(s) Performed: CYSTOSCOPY/URETEROSCOPY/STENT EXCHANGE (Right )  Patient Location: PACU  Anesthesia Type:General  Level of Consciousness: drowsy, patient cooperative and responds to stimulation  Airway & Oxygen Therapy: Patient Spontanous Breathing and Patient connected to face mask oxygen  Post-op Assessment: Report given to RN and Post -op Vital signs reviewed and stable  Post vital signs: Reviewed and stable  Last Vitals:  Vitals Value Taken Time  BP 140/120 09/08/19 1200  Temp    Pulse 91 09/08/19 1201  Resp 13 09/08/19 1201  SpO2 100 % 09/08/19 1201  Vitals shown include unvalidated device data.  Last Pain:  Vitals:   09/08/19 0836  TempSrc:   PainSc: 7          Complications: No apparent anesthesia complications

## 2019-09-08 NOTE — Anesthesia Procedure Notes (Signed)
Procedure Name: LMA Insertion Date/Time: 09/08/2019 11:24 AM Performed by: Victoriano Lain, CRNA Pre-anesthesia Checklist: Patient identified, Emergency Drugs available, Suction available, Patient being monitored and Timeout performed Patient Re-evaluated:Patient Re-evaluated prior to induction Oxygen Delivery Method: Circle system utilized Preoxygenation: Pre-oxygenation with 100% oxygen Induction Type: IV induction LMA: LMA inserted LMA Size: 4.0 Number of attempts: 1 Placement Confirmation: positive ETCO2 and breath sounds checked- equal and bilateral Tube secured with: Tape Dental Injury: Teeth and Oropharynx as per pre-operative assessment

## 2019-10-06 ENCOUNTER — Telehealth: Payer: Self-pay | Admitting: Gastroenterology

## 2019-10-06 NOTE — Telephone Encounter (Signed)
See note below and advise if pt can be direct procedure.

## 2019-10-07 NOTE — Telephone Encounter (Signed)
She has a stricture at the [facility] which has been dilated in the past with improvement in symptoms, last > 1 year ago. I think okay to direct book for EGD with dilation at the [facility] if symptoms have recurred. Can you confirm she is not on PLavix (was on this in the past) and not on oxygen, if not okay to direct book. Thanks

## 2019-10-10 ENCOUNTER — Telehealth: Payer: Self-pay | Admitting: Gastroenterology

## 2019-10-10 NOTE — Telephone Encounter (Signed)
Patient is not on Plavix and has not used her 02 at night for over 6 months.  She will come for pre-visit and EGD 4/15 and 4/19

## 2019-10-10 NOTE — Telephone Encounter (Signed)
See other phone notes for details.  

## 2019-10-13 ENCOUNTER — Telehealth: Payer: Self-pay | Admitting: Physician Assistant

## 2019-10-13 NOTE — Telephone Encounter (Signed)
Patient called states she has been vomiting and last night she notices something around her belly button the size of a baseball pop up thinks it is a hernia seeking advise.

## 2019-10-13 NOTE — Telephone Encounter (Signed)
Patient is on liquid diet. She is afraid of getting choked while she is alone. She became nauseated last night and vomited. She has a known umbilical hernia. She noticed it was bulging more than her usual. It does reduce when she is laying down. It is not tender. She is having her normal bowel movements.  She asks who repairs hernias.  Discussed warning signs of problems with hernias. Discussed getting adequate nutrition on liquid diets. She has an EGD scheduled with Dr Havery Moros soon. Thanks me for the information.

## 2019-10-20 ENCOUNTER — Ambulatory Visit (AMBULATORY_SURGERY_CENTER): Payer: Self-pay | Admitting: *Deleted

## 2019-10-20 ENCOUNTER — Other Ambulatory Visit: Payer: Self-pay

## 2019-10-20 VITALS — Temp 96.8°F | Ht 62.0 in | Wt 201.0 lb

## 2019-10-20 DIAGNOSIS — R1319 Other dysphagia: Secondary | ICD-10-CM

## 2019-10-20 DIAGNOSIS — R1310 Dysphagia, unspecified: Secondary | ICD-10-CM

## 2019-10-20 NOTE — Progress Notes (Signed)
No egg or soy allergy known to patient  No issues with past sedation with any surgeries  or procedures, no intubation problems - resp failure secondary to aspiration with general anesthesia - no issues with MAC here in the [place]  No diet pills per patient No home 02 use per patient  No blood thinners per patient  Pt denies issues with constipation  No A fib or A flutter  EMMI video sent to pt's e mail   Due to the COVID-19 pandemic we are asking patients to follow these guidelines. Please only bring one care partner. Please be aware that your care partner may wait in the car in the parking lot or if they feel like they will be too hot to wait in the car, they may wait in the lobby on the 4th floor. All care partners are required to wear a mask the entire time (we do not have any that we can provide them), they need to practice social distancing, and we will do a Covid check for all patient's and care partners when you arrive. Also we will check their temperature and your temperature. If the care partner waits in their car they need to stay in the parking lot the entire time and we will call them on their cell phone when the patient is ready for discharge so they can bring the car to the front of the building. Also all patient's will need to wear a mask into building.

## 2019-10-21 ENCOUNTER — Other Ambulatory Visit: Payer: Self-pay | Admitting: Gastroenterology

## 2019-10-21 ENCOUNTER — Ambulatory Visit (INDEPENDENT_AMBULATORY_CARE_PROVIDER_SITE_OTHER): Payer: Medicaid Other

## 2019-10-21 DIAGNOSIS — Z1159 Encounter for screening for other viral diseases: Secondary | ICD-10-CM

## 2019-10-22 LAB — SARS CORONAVIRUS 2 (TAT 6-24 HRS): SARS Coronavirus 2: NEGATIVE

## 2019-10-24 ENCOUNTER — Other Ambulatory Visit: Payer: Self-pay

## 2019-10-24 ENCOUNTER — Ambulatory Visit (AMBULATORY_SURGERY_CENTER): Payer: Medicaid Other | Admitting: Gastroenterology

## 2019-10-24 ENCOUNTER — Encounter: Payer: Self-pay | Admitting: Gastroenterology

## 2019-10-24 VITALS — BP 113/66 | HR 71 | Temp 97.3°F | Resp 19 | Ht 62.0 in | Wt 201.0 lb

## 2019-10-24 DIAGNOSIS — K449 Diaphragmatic hernia without obstruction or gangrene: Secondary | ICD-10-CM | POA: Diagnosis not present

## 2019-10-24 DIAGNOSIS — K222 Esophageal obstruction: Secondary | ICD-10-CM

## 2019-10-24 DIAGNOSIS — K317 Polyp of stomach and duodenum: Secondary | ICD-10-CM

## 2019-10-24 DIAGNOSIS — R1310 Dysphagia, unspecified: Secondary | ICD-10-CM | POA: Diagnosis not present

## 2019-10-24 MED ORDER — SODIUM CHLORIDE 0.9 % IV SOLN: 500.0000 mL | Freq: Once | INTRAVENOUS | Status: DC

## 2019-10-24 NOTE — Patient Instructions (Signed)
Impression/Recommendations:  Hiatal hernia handout given to patient. Dilation diet handout given to patient.  Continue present medications. Follow-up as needed.  YOU HAD AN ENDOSCOPIC PROCEDURE TODAY AT [facility]:   Refer to the procedure report that was given to you for any specific questions about what was found during the examination.  If the procedure report does not answer your questions, please call your gastroenterologist to clarify.  If you requested that your care partner not be given the details of your procedure findings, then the procedure report has been included in a sealed envelope for you to review at your convenience later.  YOU SHOULD EXPECT: Some feelings of bloating in the abdomen. Passage of more gas than usual.  Walking can help get rid of the air that was put into your GI tract during the procedure and reduce the bloating. If you had a lower endoscopy (such as a colonoscopy or flexible sigmoidoscopy) you may notice spotting of blood in your stool or on the toilet paper. If you underwent a bowel prep for your procedure, you may not have a normal bowel movement for a few days.  Please Note:  You might notice some irritation and congestion in your nose or some drainage.  This is from the oxygen used during your procedure.  There is no need for concern and it should clear up in a day or so.  SYMPTOMS TO REPORT IMMEDIATELY:   Following upper endoscopy (EGD)  Vomiting of blood or coffee ground material  New chest pain or pain under the shoulder blades  Painful or persistently difficult swallowing  New shortness of breath  Fever of 100F or higher  Black, tarry-looking stools  For urgent or emergent issues, a gastroenterologist can be reached at any hour by calling [phone]. Do not use MyChart messaging for urgent concerns.    DIET:  We do recommend a small meal at first, but then you may proceed to your regular diet.  Drink plenty of fluids but  you should avoid alcoholic beverages for 24 hours.  ACTIVITY:  You should plan to take it easy for the rest of today and you should NOT DRIVE or use heavy machinery until tomorrow (because of the sedation medicines used during the test).    FOLLOW UP: Our staff will call the number listed on your records 48-72 hours following your procedure to check on you and address any questions or concerns that you may have regarding the information given to you following your procedure. If we do not reach you, we will leave a message.  We will attempt to reach you two times.  During this call, we will ask if you have developed any symptoms of COVID 19. If you develop any symptoms (ie: fever, flu-like symptoms, shortness of breath, cough etc.) before then, please call [phone].  If you test positive for Covid 19 in the 2 weeks post procedure, please call and report this information to [place].    If any biopsies were taken you will be contacted by phone or by letter within the next 1-3 weeks.  Please call us at [phone] if you have not heard about the biopsies in 3 weeks.    SIGNATURES/CONFIDENTIALITY: You and/or your care partner have signed paperwork which will be entered into your electronic medical record.  These signatures attest to the fact that that the information above on your After Visit Summary has been reviewed and is understood.  Full responsibility of the confidentiality of this  discharge information lies with you and/or your care-partner. 

## 2019-10-24 NOTE — Progress Notes (Signed)
Pt's states no medical or surgical changes since previsit or office visit.  TEMP-JB  V/S-CW

## 2019-10-24 NOTE — Progress Notes (Signed)
Report given to PACU, vss 

## 2019-10-24 NOTE — Progress Notes (Signed)
Called to room to assist during endoscopic procedure.  Patient ID and intended procedure confirmed with present staff. Received instructions for my participation in the procedure from the performing physician.  

## 2019-10-24 NOTE — Op Note (Signed)
[place] Patient Name: Charlesa Czarnecki Procedure Date: 10/24/2019 9:13 AM MRN: [id number] Endoscopist: Remo Lipps P. Havery Moros , MD Age: 64 Referring MD:  Date of Birth: [birth year] Gender: Female Account #: [id number] Procedure:                Upper GI endoscopy Indications:              Dysphagia - history of benign distal esophageal                            stricture s/p dilation in the past, last in 08/2018 Medicines:                Monitored Anesthesia Care Procedure:                Pre-Anesthesia Assessment:                           - Prior to the procedure, a History and Physical                            was performed, and patient medications and                            allergies were reviewed. The patient's tolerance of                            previous anesthesia was also reviewed. The risks                            and benefits of the procedure and the sedation                            options and risks were discussed with the patient.                            All questions were answered, and informed consent                            was obtained. Prior Anticoagulants: The patient has                            taken no previous anticoagulant or antiplatelet                            agents. ASA Grade Assessment: III - A patient with                            severe systemic disease. After reviewing the risks                            and benefits, the patient was deemed in                            satisfactory condition to undergo the procedure.  After obtaining informed consent, the endoscope was                            passed under direct vision. Throughout the                            procedure, the patient's blood pressure, pulse, and                            oxygen saturations were monitored continuously. The                            Endoscope was introduced through the mouth, and   advanced to the second part of duodenum. The upper                            GI endoscopy was accomplished without difficulty.                            The patient tolerated the procedure well. Scope In: Scope Out: Findings:                 Esophagogastric landmarks were identified: the                            Z-line was found at 35 cm, the gastroesophageal                            junction was found at 35 cm and the upper extent of                            the gastric folds was found at 38 cm from the                            incisors.                           A 3 cm hiatal hernia was present.                           One benign-appearing, intrinsic mild stenosis was                            found 35 cm from the incisors, just above the GEJ.                            A TTS dilator was passed through the scope.                            Dilation with a 16-17-18 mm balloon dilator was                            performed to 16 mm, 17 mm and 18 mm, at which point  appropriate mucosal wrent was noted.                           The exam of the esophagus was otherwise normal.                            Previously biopsied and no evidence of EoE.                           Multiple small sessile polyps were found in the                            gastric fundus and in the gastric body. Previously                            biopsied and benign, no biopsies taken today.                           The exam of the stomach was otherwise normal.                           The duodenal bulb and second portion of the                            duodenum were normal. Complications:            No immediate complications. Estimated blood loss:                            Minimal. Estimated Blood Loss:     Estimated blood loss was minimal. Impression:               - Esophagogastric landmarks identified.                           - 3 cm hiatal hernia.                            - Benign-appearing esophageal stenosis. Dilated to                            11mm.                           - Multiple benign appearing small gastric polyps.                           - Normal stomach otherwise                           - Normal duodenal bulb and second portion of the                            duodenum. Recommendation:           - Patient has a contact number available for  emergencies. The signs and symptoms of potential                            delayed complications were discussed with the                            patient. Return to normal activities tomorrow.                            Written discharge instructions were provided to the                            patient.                           - Post dilation diet.                           - Continue present medications.                           - Await course post dilation, follow up as needed Remo Lipps P. Kemond Amorin, MD 10/24/2019 9:40:03 AM This report has been signed electronically.

## 2019-10-26 ENCOUNTER — Telehealth: Payer: Self-pay | Admitting: *Deleted

## 2019-10-26 NOTE — Telephone Encounter (Signed)
  Follow up Call-  Call back number 10/24/2019 08/24/2018 07/29/2018  Post procedure Call Back phone  # [phone]  Permission to leave phone message Yes Yes Yes  Some recent data might be hidden     Patient questions:  Do you have a fever, pain , or abdominal swelling? No. Pain Score  0 *  Have you tolerated food without any problems? Yes.    Have you been able to return to your normal activities? Yes.    Do you have any questions about your discharge instructions: Diet   No. Medications  No. Follow up visit  No.  Do you have questions or concerns about your Care? No.  Actions: * If pain score is 4 or above: No action needed, pain <4.  1. Have you developed a fever since your procedure? no  2.   Have you had an respiratory symptoms (SOB or cough) since your procedure? no  3.   Have you tested positive for COVID 19 since your procedure no  4.   Have you had any family members/close contacts diagnosed with the COVID 19 since your procedure?  no   If yes to any of these questions please route to Joylene John, RN and Erenest Rasher, RN

## 2019-11-09 ENCOUNTER — Telehealth: Payer: Self-pay | Admitting: Gastroenterology

## 2019-11-09 NOTE — Telephone Encounter (Signed)
Patient is calling- she would like to talk about what Dr. Havery Moros told her about the hernias she has. She had a consult with Dr. Dema Severin today and wants to make sure what she was told to remove. If he wanted her to have the sliding hernia removed or left alone. She thinks that Dr. Havery Moros told her to leave it alone but she needed to make sure.

## 2019-11-09 NOTE — Telephone Encounter (Signed)
If I recall I don't think I had referred her for a hiatal hernia repair. Can you send me Dr. Orest Dikes notes so I can clarify what her question is and what he is recommending? Thanks

## 2019-11-09 NOTE — Telephone Encounter (Signed)
Patient is asking if the hiatal hernia requires repair or just the umbilical hernia?  Patient is notified that we will contact her next week when Dr. Havery Moros returns,

## 2019-11-11 NOTE — Telephone Encounter (Signed)
Patient notified of Dr. Doyne Keel recommendations.  She is scheduled for umbilical hernia repair with Dr. Dema Severin. Notes are not here from [facility] yet.  Will place in your office when they arricve

## 2019-11-17 ENCOUNTER — Ambulatory Visit: Payer: Self-pay | Admitting: Surgery

## 2019-11-17 NOTE — H&P (Signed)
CC: Evaluate ventral/incisional hernia  HPI: Ann [name] is a very pleasant 64yoF who presents for evaluation of a supraumbilical bulge. Her husband whom recently passed away is a former patient of mine and she had established rapport with [place] through encounters with him. She has a history of laparoscopic cholecystectomy 02/2017 by one of my partners, Dr. Ninfa [name] for symptomatic cholelithiasis. She subsequently had issues with diverticulitis and 4 months later underwent exploratory laparotomy with sigmoid colectomy by Dr. Barry Dienes. She recovered well and was discharged on postoperative day #6. Over the last couple of years she has developed a bulge just above her umbilicus. This causes pain with activity and she has noticed significant discomfort and "swelling" of this bulge whenever she develops any sort of constipation. Its always uncomfortable to manipulate or when she is bending over/exerting herself. There are times where it is been difficult to reduce but she has ultimately been successful.  She underwent CT A/P 08/29/19 for evaluation of abdominal and right hip pain which showed an umbilical/supraumbilical hernia containing a loop of transverse colon; it also showed a 3 mm distal right ureteral stone with severe hydronephrosis and perinephric straining which is likely cause of her abdominal and hip pain.  PMH: HTN, HLD, kidney stones, esophageal strictures, GERD  PSH: BTL (1980s); Lap chole (02/2017); open sigmoidectomy (06/2017). Multiple orthopedic procedures (knees); rectocele repair years ago; urologic procedures  FHx: Denies FHx of colorectal, breast, endometrial, ovarian or cervical cancer  Social: Denies use of tobacco/EtOH/drugs.  ROS: A comprehensive 10 system review of systems was completed with the patient and pertinent findings as noted above.  The patient is a 64 year old female.   Allergies [facility], RMA; 11/09/2019 1:53 PM) ACE Inhibitors  Doxycycline Monohydrate   Bis Subcit-Metronid-Tetracyc  Allergies Reconciled   Medication History (Jacqueline Haggett, RMA; 11/09/2019 1:56 PM) amLODIPine Besylate (10MG  Tablet, Oral) Active. ClonazePAM (0.5MG  Tablet, Oral) Active. Promethazine HCl (12.5MG  Tablet, 1 (one) Oral every four hours, as needed, Taken starting 07/09/2017) Active. hydroCHLOROthiazide (12.5MG  Capsule, Oral) Active. DULoxetine HCl (30MG  Capsule DR Part, Oral) Active. traZODone HCl (50MG  Tablet, Oral) Active. Myrbetriq (Oral) Specific strength unknown - Active. Metoprolol Succinate ER (50MG  Tablet ER 24HR, Oral) Active. Aspirin (81MG  Tablet DR, Oral) Active. Omeprazole (40MG  Capsule DR, Oral) Active. Topiramate (50MG  Tablet, Oral) Active. Medications Reconciled    Review of Systems [place] Gave M. Sher Hellinger MD; 11/09/2019 2:21 PM) General Present- Appetite Loss and Weight Gain. Not Present- Chills, Fatigue, Fever, Night Sweats and Weight Loss. Respiratory Present- Difficulty Breathing. Not Present- Bloody sputum, Chronic Cough, Snoring and Wheezing. Cardiovascular Present- Difficulty Breathing Lying Down, Leg Cramps, Shortness of Breath and Swelling of Extremities. Not Present- Chest Pain, Palpitations and Rapid Heart Rate. Gastrointestinal Present- Abdominal Pain, Gets full quickly at meals, Hemorrhoids, Indigestion and Nausea. Not Present- Bloating, Bloody Stool, Change in Bowel Habits, Chronic diarrhea, Constipation, Difficulty Swallowing, Excessive gas, Rectal Pain and Vomiting. Female Genitourinary Present- Frequency and Nocturia. Not Present- Painful Urination, Pelvic Pain and Urgency. Musculoskeletal Present- Back Pain and Joint Pain. Not Present- Joint Stiffness, Muscle Pain, Muscle Weakness and Swelling of Extremities. Neurological Present- Headaches and Trouble walking. Not Present- Decreased Memory, Fainting, Numbness, Seizures, Tingling, Tremor and Weakness. Psychiatric Not Present- Anxiety and Depression. Hematology Not  Present- Abnormal Bleeding, Blood Clots and Blood Thinners. All other systems negative  Vitals Geni Bers Haggett RMA; 11/09/2019 1:57 PM) 11/09/2019 1:56 PM Weight: 198.4 lb Height: 62in Body Surface Area: 1.9 m Body Mass Index: 36.29 kg/m  Temp.: 97.60F (Temporal)  Pulse: 110 (  Regular)  P.OX: 90% (Room air)       Physical Exam [place]rell Gave M. Calob Baskette MD; 11/09/2019 2:21 PM) The physical exam findings are as follows: Note: Constitutional: No acute distress; conversant; no deformities; wearing mask Eyes: Moist conjunctiva; no lid lag; anicteric sclerae; pupils equal and round Neck: Trachea midline; no palpable thyromegaly Lungs: Normal respiratory effort; no tactile fremitus CV: rrr; no palpable thrill; no pitting edema GI: Abdomen soft, nontender, nondistended; no palpable hepatosplenomegaly. Reducible supraumbilical bulge. No overlying skin changes MSK: Normal gait; no clubbing/cyanosis Psychiatric: Appropriate affect; alert and oriented 3 Lymphatic: No palpable cervical or axillary lymphadenopathy    Assessment & Plan [place] Gave M. Anylah Scheib MD; 11/09/2019 2:34 PM) Fatima Blank HERNIA (K43.2) Story: Ann [name] is a very pleasant 64yoF with hx HTN, HLD, DM, kidney stones and here today for evaluation of symptomatic reducible incisional hernia Impression: -The anatomy and physiology of the GI tract and abdominal wall was discussed at length with the patient with associated illustrations. The pathophysiology of hernias was discussed at length with associated pictures. -We discussed options going forward including further observation versus surgery. With her not having had a symptomatic incisional hernia, risk of incarceration/strangulation is higher than asymptomatic patients. She is interested in pursuing surgery. We discussed laparoscopic incisional hernia repair with placement of mesh. Scenarios where open procedure may be necessary. -The planned procedure, material risks  (including, but not limited to, pain, bleeding, infection, scarring, need for blood transfusion, damage to surrounding structures-blood vessels/nerves/viscus/organs, need for additional procedures, recurrence, chronic pain, mesh complications including erosion into other structures/vessels/organs/viscus, pneumonia, heart attack, stroke, death) benefits and alternatives to surgery were discussed at length. I noted a good probability that the procedure help improve her symptoms and ideally reduce long-term risks of incarceration/strangulation. The patient's questions were answered to her satisfaction, she voiced understanding and they elected to proceed with surgery. Additionally, we discussed typical postoperative expectations and the recovery process.  This patient encounter took 37 minutes today to perform the following: take history, perform exam, review outside records, interpret imaging, counsel the patient on their diagnosis and document encounter, findings & plan in the EHR Current Plans Pt Education - CCS Mesh education: discussed with patient and provided information. Signed by Ileana Roup, MD (11/09/2019 2:34 PM)

## 2019-11-17 NOTE — H&P (View-Only) (Signed)
CC: Evaluate ventral/incisional hernia  HPI: Ann [name] is a very pleasant 53yoF who presents for evaluation of a supraumbilical bulge. Her husband whom recently passed away is a former patient of mine and she had established rapport with [place] through encounters with him. She has a history of laparoscopic cholecystectomy 02/2017 by one of my partners, Dr. Ninfa [name] for symptomatic cholelithiasis. She subsequently had issues with diverticulitis and 4 months later underwent exploratory laparotomy with sigmoid colectomy by Dr. Barry Dienes. She recovered well and was discharged on postoperative day #6. Over the last couple of years she has developed a bulge just above her umbilicus. This causes pain with activity and she has noticed significant discomfort and "swelling" of this bulge whenever she develops any sort of constipation. Its always uncomfortable to manipulate or when she is bending over/exerting herself. There are times where it is been difficult to reduce but she has ultimately been successful.  She underwent CT A/P 08/29/19 for evaluation of abdominal and right hip pain which showed an umbilical/supraumbilical hernia containing a loop of transverse colon; it also showed a 3 mm distal right ureteral stone with severe hydronephrosis and perinephric straining which is likely cause of her abdominal and hip pain.  PMH: HTN, HLD, kidney stones, esophageal strictures, GERD  PSH: BTL (1980s); Lap chole (02/2017); open sigmoidectomy (06/2017). Multiple orthopedic procedures (knees); rectocele repair years ago; urologic procedures  FHx: Denies FHx of colorectal, breast, endometrial, ovarian or cervical cancer  Social: Denies use of tobacco/EtOH/drugs.  ROS: A comprehensive 10 system review of systems was completed with the patient and pertinent findings as noted above.  The patient is a 64 year old female.   Allergies [facility], RMA; 11/09/2019 1:53 PM) ACE Inhibitors  Doxycycline Monohydrate   Bis Subcit-Metronid-Tetracyc  Allergies Reconciled   Medication History (Jacqueline Haggett, RMA; 11/09/2019 1:56 PM) amLODIPine Besylate (10MG  Tablet, Oral) Active. ClonazePAM (0.5MG  Tablet, Oral) Active. Promethazine HCl (12.5MG  Tablet, 1 (one) Oral every four hours, as needed, Taken starting 07/09/2017) Active. hydroCHLOROthiazide (12.5MG  Capsule, Oral) Active. DULoxetine HCl (30MG  Capsule DR Part, Oral) Active. traZODone HCl (50MG  Tablet, Oral) Active. Myrbetriq (Oral) Specific strength unknown - Active. Metoprolol Succinate ER (50MG  Tablet ER 24HR, Oral) Active. Aspirin (81MG  Tablet DR, Oral) Active. Omeprazole (40MG  Capsule DR, Oral) Active. Topiramate (50MG  Tablet, Oral) Active. Medications Reconciled    Review of Systems [place]rell Gave M. Gedeon Brandow MD; 11/09/2019 2:21 PM) General Present- Appetite Loss and Weight Gain. Not Present- Chills, Fatigue, Fever, Night Sweats and Weight Loss. Respiratory Present- Difficulty Breathing. Not Present- Bloody sputum, Chronic Cough, Snoring and Wheezing. Cardiovascular Present- Difficulty Breathing Lying Down, Leg Cramps, Shortness of Breath and Swelling of Extremities. Not Present- Chest Pain, Palpitations and Rapid Heart Rate. Gastrointestinal Present- Abdominal Pain, Gets full quickly at meals, Hemorrhoids, Indigestion and Nausea. Not Present- Bloating, Bloody Stool, Change in Bowel Habits, Chronic diarrhea, Constipation, Difficulty Swallowing, Excessive gas, Rectal Pain and Vomiting. Female Genitourinary Present- Frequency and Nocturia. Not Present- Painful Urination, Pelvic Pain and Urgency. Musculoskeletal Present- Back Pain and Joint Pain. Not Present- Joint Stiffness, Muscle Pain, Muscle Weakness and Swelling of Extremities. Neurological Present- Headaches and Trouble walking. Not Present- Decreased Memory, Fainting, Numbness, Seizures, Tingling, Tremor and Weakness. Psychiatric Not Present- Anxiety and Depression. Hematology Not  Present- Abnormal Bleeding, Blood Clots and Blood Thinners. All other systems negative  Vitals Geni Bers Haggett RMA; 11/09/2019 1:57 PM) 11/09/2019 1:56 PM Weight: 198.4 lb Height: 62in Body Surface Area: 1.9 m Body Mass Index: 36.29 kg/m  Temp.: 97.69F (Temporal)  Pulse: 110 (  Regular)  P.OX: 90% (Room air)       Physical Exam Harrell Gave M. Dorsey Charette MD; 11/09/2019 2:21 PM) The physical exam findings are as follows: Note: Constitutional: No acute distress; conversant; no deformities; wearing mask Eyes: Moist conjunctiva; no lid lag; anicteric sclerae; pupils equal and round Neck: Trachea midline; no palpable thyromegaly Lungs: Normal respiratory effort; no tactile fremitus CV: rrr; no palpable thrill; no pitting edema GI: Abdomen soft, nontender, nondistended; no palpable hepatosplenomegaly. Reducible supraumbilical bulge. No overlying skin changes MSK: Normal gait; no clubbing/cyanosis Psychiatric: Appropriate affect; alert and oriented 3 Lymphatic: No palpable cervical or axillary lymphadenopathy    Assessment & Plan Harrell Gave M. Shamia Uppal MD; 11/09/2019 2:34 PM) Ann [name] HERNIA (K43.2) Story: Ann [name] is a very pleasant 83yoF with hx HTN, HLD, DM, kidney stones and here today for evaluation of symptomatic reducible incisional hernia Impression: -The anatomy and physiology of the GI tract and abdominal wall was discussed at length with the patient with associated illustrations. The pathophysiology of hernias was discussed at length with associated pictures. -We discussed options going forward including further observation versus surgery. With her not having had a symptomatic incisional hernia, risk of incarceration/strangulation is higher than asymptomatic patients. She is interested in pursuing surgery. We discussed laparoscopic incisional hernia repair with placement of mesh. Scenarios where open procedure may be necessary. -The planned procedure, material risks  (including, but not limited to, pain, bleeding, infection, scarring, need for blood transfusion, damage to surrounding structures-blood vessels/nerves/viscus/organs, need for additional procedures, recurrence, chronic pain, mesh complications including erosion into other structures/vessels/organs/viscus, pneumonia, heart attack, stroke, death) benefits and alternatives to surgery were discussed at length. I noted a good probability that the procedure help improve her symptoms and ideally reduce long-term risks of incarceration/strangulation. The patient's questions were answered to her satisfaction, she voiced understanding and they elected to proceed with surgery. Additionally, we discussed typical postoperative expectations and the recovery process.  This patient encounter took 37 minutes today to perform the following: take history, perform exam, review outside records, interpret imaging, counsel the patient on their diagnosis and document encounter, findings & plan in the EHR Current Plans Pt Education - CCS Mesh education: discussed with patient and provided information. Signed by Ileana Roup, MD (11/09/2019 2:34 PM)

## 2019-11-22 NOTE — Patient Instructions (Addendum)
DUE TO COVID-19 ONLY ONE VISITOR IS ALLOWED TO COME WITH YOU AND STAY IN THE WAITING ROOM ONLY DURING PRE OP AND PROCEDURE DAY OF SURGERY. THE 1 VISITOR MAY VISIT WITH YOU AFTER SURGERY IN YOUR PRIVATE ROOM DURING VISITING HOURS ONLY!  YOU NEED TO HAVE A COVID 19 TEST ON 11-24-19 @ 1:00 PM, THIS TEST MUST BE DONE BEFORE SURGERY, COME  [place], [place] , [address].  ([place]) ONCE YOUR COVID TEST IS COMPLETED, PLEASE BEGIN THE QUARANTINE INSTRUCTIONS AS OUTLINED IN YOUR HANDOUT.                SYMORA STATHIS  11/22/2019   Your procedure is scheduled on: 11-28-19   Report to [facility] Main  Entrance    Report to Admitting at  5:30 AM     Call this number if you have problems the morning of surgery [phone]    Remember: Do not eat food or drink liquids :After Midnight.     Take these medicines the morning of surgery with A SIP OF WATER: Amlodipine (Norvasc), Omeprazole (Prilosec), and Topiramate (Topamax)  BRUSH YOUR TEETH MORNING OF SURGERY AND RINSE YOUR MOUTH OUT, NO CHEWING GUM CANDY OR MINTS.                                You may not have any metal on your body including hair pins and              piercings    Do not wear jewelry, make-up, lotions, powders or perfumes, deodorant              Do not wear nail polish on your fingernails.  Do not shave  48 hours prior to surgery.      Do not bring valuables to the hospital. [place].  Contacts, dentures or bridgework may not be worn into surgery.  You may bring an overnight bag    Special Instructions: N/A              Please read over the following fact sheets you were given: _____________________________________________________________________             [facility] - Preparing for Surgery Before surgery, you can play an important role.  Because skin is not sterile, your skin needs to be as free of germs as possible.  You can  reduce the number of germs on your skin by washing with CHG (chlorahexidine gluconate) soap before surgery.  CHG is an antiseptic cleaner which kills germs and bonds with the skin to continue killing germs even after washing. Please DO NOT use if you have an allergy to CHG or antibacterial soaps.  If your skin becomes reddened/irritated stop using the CHG and inform your nurse when you arrive at Short Stay. Do not shave (including legs and underarms) for at least 48 hours prior to the first CHG shower.  You may shave your face/neck. Please follow these instructions carefully:  1.  Shower with CHG Soap the night before surgery and the  morning of Surgery.  2.  If you choose to wash your hair, wash your hair first as usual with your  normal  shampoo.  3.  After you shampoo, rinse your hair and body thoroughly to remove the  shampoo.  4.  Use CHG as you would any other liquid soap.  You can apply chg directly  to the skin and wash                       Gently with a scrungie or clean washcloth.  5.  Apply the CHG Soap to your body ONLY FROM THE NECK DOWN.   Do not use on face/ open                           Wound or open sores. Avoid contact with eyes, ears mouth and genitals (private parts).                       Wash face,  Genitals (private parts) with your normal soap.             6.  Wash thoroughly, paying special attention to the area where your surgery  will be performed.  7.  Thoroughly rinse your body with warm water from the neck down.  8.  DO NOT shower/wash with your normal soap after using and rinsing off  the CHG Soap.                9.  Pat yourself dry with a clean towel.            10.  Wear clean pajamas.            11.  Place clean sheets on your bed the night of your first shower and do not  sleep with pets. Day of Surgery : Do not apply any lotions/deodorants the morning of surgery.  Please wear clean clothes to the hospital/surgery center.  FAILURE TO  FOLLOW THESE INSTRUCTIONS MAY RESULT IN THE CANCELLATION OF YOUR SURGERY PATIENT SIGNATURE_________________________________  NURSE SIGNATURE__________________________________  ________________________________________________________________________

## 2019-11-22 NOTE — Progress Notes (Addendum)
PCP - Berkley Harvey, NP LOV 09/01/19 Cardiologist - Dr. Gillian Shields   Chest x-ray -  EKG - 09-06-19 Stress Test -  ECHO -  Cardiac Cath -   Sleep Study -  CPAP -   Fasting Blood Sugar -  Checks Blood Sugar _____ times a day  Blood Thinner Instructions: Aspirin Instructions: 81 mg ASA Last Dose:11-17-19  Anesthesia review:   Patient denies shortness of breath, fever, cough and chest pain at PAT appointment   Patient verbalized understanding of instructions that were given to them at the PAT appointment. Patient was also instructed that they will need to review over the PAT instructions again at home before surgery.

## 2019-11-24 ENCOUNTER — Other Ambulatory Visit (HOSPITAL_COMMUNITY)
Admission: RE | Admit: 2019-11-24 | Discharge: 2019-11-24 | Disposition: A | Discharge status: Home / Self Care | Payer: Medicaid Other | Source: Ambulatory Visit | Attending: Surgery | Admitting: Surgery

## 2019-11-24 ENCOUNTER — Other Ambulatory Visit: Payer: Self-pay

## 2019-11-24 ENCOUNTER — Encounter (HOSPITAL_COMMUNITY)
Admission: RE | Admit: 2019-11-24 | Discharge: 2019-11-24 | Disposition: A | Discharge status: Home / Self Care | Payer: Medicaid Other | Source: Ambulatory Visit | Attending: Surgery | Admitting: Surgery

## 2019-11-24 ENCOUNTER — Encounter (HOSPITAL_COMMUNITY): Payer: Self-pay

## 2019-11-24 DIAGNOSIS — Z01812 Encounter for preprocedural laboratory examination: Secondary | ICD-10-CM | POA: Diagnosis not present

## 2019-11-24 DIAGNOSIS — Z20822 Contact with and (suspected) exposure to covid-19: Secondary | ICD-10-CM | POA: Diagnosis not present

## 2019-11-24 LAB — COMPREHENSIVE METABOLIC PANEL
ALT: 15 U/L (ref 0–44)
AST: 16 U/L (ref 15–41)
Albumin: 4.1 g/dL (ref 3.5–5.0)
Alkaline Phosphatase: 63 U/L (ref 38–126)
Anion gap: 9 (ref 5–15)
BUN: 15 mg/dL (ref 8–23)
CO2: 26 mmol/L (ref 22–32)
Calcium: 9.1 mg/dL (ref 8.9–10.3)
Chloride: 107 mmol/L (ref 98–111)
Creatinine, Ser: 1.41 mg/dL — ABNORMAL HIGH (ref 0.44–1.00)
GFR calc Af Amer: 46 mL/min — ABNORMAL LOW (ref >60)
GFR calc non Af Amer: 40 mL/min — ABNORMAL LOW (ref >60)
Glucose, Bld: 104 mg/dL — ABNORMAL HIGH (ref 70–99)
Potassium: 4.1 mmol/L (ref 3.5–5.1)
Sodium: 142 mmol/L (ref 135–145)
Total Bilirubin: 0.7 mg/dL (ref 0.3–1.2)
Total Protein: 7.7 g/dL (ref 6.5–8.1)

## 2019-11-24 LAB — APTT: aPTT: 33 seconds (ref 24–36)

## 2019-11-24 LAB — HEMOGLOBIN A1C
Hgb A1c MFr Bld: 5.7 % — ABNORMAL HIGH (ref 4.8–5.6)
Mean Plasma Glucose: 116.89 mg/dL

## 2019-11-24 LAB — CBC WITH DIFFERENTIAL/PLATELET
Abs Immature Granulocytes: 0.01 10*3/uL (ref 0.00–0.07)
Basophils Absolute: 0 10*3/uL (ref 0.0–0.1)
Basophils Relative: 1 %
Eosinophils Absolute: 0.1 10*3/uL (ref 0.0–0.5)
Eosinophils Relative: 2 %
HCT: 45 % (ref 36.0–46.0)
Hemoglobin: 14.4 g/dL (ref 12.0–15.0)
Immature Granulocytes: 0 %
Lymphocytes Relative: 35 %
Lymphs Abs: 1.8 10*3/uL (ref 0.7–4.0)
MCH: 31.1 pg (ref 26.0–34.0)
MCHC: 32 g/dL (ref 30.0–36.0)
MCV: 97.2 fL (ref 80.0–100.0)
Monocytes Absolute: 0.4 10*3/uL (ref 0.1–1.0)
Monocytes Relative: 7 %
Neutro Abs: 2.9 10*3/uL (ref 1.7–7.7)
Neutrophils Relative %: 55 %
Platelets: 284 10*3/uL (ref 150–400)
RBC: 4.63 MIL/uL (ref 3.87–5.11)
RDW: 15.2 % (ref 11.5–15.5)
WBC: 5.2 10*3/uL (ref 4.0–10.5)
nRBC: 0 % (ref 0.0–0.2)

## 2019-11-24 LAB — PROTIME-INR
INR: 1.1 (ref 0.8–1.2)
Prothrombin Time: 13.3 seconds (ref 11.4–15.2)

## 2019-11-24 LAB — SURGICAL PCR SCREEN
MRSA, PCR: NEGATIVE
Staphylococcus aureus: NEGATIVE

## 2019-11-24 NOTE — Progress Notes (Signed)
Per lab T&S, + antibodies. Order replaced for day of surgery.

## 2019-11-25 LAB — SARS CORONAVIRUS 2 (TAT 6-24 HRS): SARS Coronavirus 2: NEGATIVE

## 2019-11-27 MED ORDER — BUPIVACAINE LIPOSOME 1.3 % IJ SUSP
20.0000 mL | INTRAMUSCULAR | Status: DC
  Filled 2019-11-27: qty 20

## 2019-11-27 NOTE — Anesthesia Preprocedure Evaluation (Addendum)
Anesthesia Evaluation  Patient identified by MRN, date of birth, ID band Patient awake    Reviewed: Allergy & Precautions, NPO status , Patient's Chart, lab work & pertinent test results  History of Anesthesia Complications (+) history of anesthetic complications (respiratory complications with previous anesthetics)  Airway Mallampati: II  TM Distance: >3 FB Neck ROM: Full    Dental  (+) Dental Advisory Given, Partial Lower, Partial Upper   Pulmonary sleep apnea and Continuous Positive Airway Pressure Ventilation , COPD,  COPD inhaler,    Pulmonary exam normal        Cardiovascular hypertension, Pt. on medications and Pt. on home beta blockers +CHF  Normal cardiovascular exam   '18 TTE - mild LVH. EF 45% to 50%. Diffuse hypokinesis. Grade 1 diastolic dysfunction. Trace MR and TR.     Neuro/Psych  Headaches, PSYCHIATRIC DISORDERS Anxiety TIACVA, No Residual Symptoms    GI/Hepatic Neg liver ROS, hiatal hernia, GERD  Medicated and Controlled,  Endo/Other   Obesity   Renal/GU CRFRenal disease  Female GU complaint     Musculoskeletal  (+) Arthritis ,   Abdominal   Peds  Hematology negative hematology ROS (+)   Anesthesia Other Findings Covid neg 11/24/19  Reproductive/Obstetrics                            Anesthesia Physical Anesthesia Plan  ASA: III  Anesthesia Plan: General   Post-op Pain Management:    Induction: Intravenous  PONV Risk Score and Plan: 4 or greater and Treatment may vary due to age or medical condition, Ondansetron, Midazolam and Dexamethasone  Airway Management Planned: Oral ETT  Additional Equipment: None  Intra-op Plan:   Post-operative Plan: Extubation in OR  Informed Consent: I have reviewed the patients History and Physical, chart, labs and discussed the procedure including the risks, benefits and alternatives for the proposed anesthesia with the  patient or authorized representative who has indicated his/her understanding and acceptance.     Dental advisory given  Plan Discussed with: CRNA and Anesthesiologist  Anesthesia Plan Comments:        Anesthesia Quick Evaluation

## 2019-11-28 ENCOUNTER — Ambulatory Visit (HOSPITAL_COMMUNITY): Payer: Medicaid Other | Admitting: Physician Assistant

## 2019-11-28 ENCOUNTER — Other Ambulatory Visit: Payer: Self-pay

## 2019-11-28 ENCOUNTER — Ambulatory Visit (HOSPITAL_COMMUNITY)
Admission: RE | Admit: 2019-11-28 | Discharge: 2019-11-28 | Disposition: A | Discharge status: Home / Self Care | Payer: Medicaid Other | Source: Ambulatory Visit | Attending: Surgery | Admitting: Surgery

## 2019-11-28 ENCOUNTER — Encounter (HOSPITAL_COMMUNITY): Payer: Self-pay | Admitting: Surgery

## 2019-11-28 ENCOUNTER — Ambulatory Visit (HOSPITAL_COMMUNITY): Payer: Medicaid Other | Admitting: Anesthesiology

## 2019-11-28 ENCOUNTER — Encounter (HOSPITAL_COMMUNITY)
Admission: RE | Disposition: A | Discharge status: Home / Self Care | Payer: Self-pay | Source: Ambulatory Visit | Attending: Surgery

## 2019-11-28 DIAGNOSIS — E785 Hyperlipidemia, unspecified: Secondary | ICD-10-CM | POA: Insufficient documentation

## 2019-11-28 DIAGNOSIS — Z7982 Long term (current) use of aspirin: Secondary | ICD-10-CM | POA: Diagnosis not present

## 2019-11-28 DIAGNOSIS — G4730 Sleep apnea, unspecified: Secondary | ICD-10-CM | POA: Diagnosis not present

## 2019-11-28 DIAGNOSIS — I10 Essential (primary) hypertension: Secondary | ICD-10-CM | POA: Diagnosis not present

## 2019-11-28 DIAGNOSIS — K219 Gastro-esophageal reflux disease without esophagitis: Secondary | ICD-10-CM | POA: Diagnosis not present

## 2019-11-28 DIAGNOSIS — J449 Chronic obstructive pulmonary disease, unspecified: Secondary | ICD-10-CM | POA: Insufficient documentation

## 2019-11-28 DIAGNOSIS — K660 Peritoneal adhesions (postprocedural) (postinfection): Secondary | ICD-10-CM | POA: Insufficient documentation

## 2019-11-28 DIAGNOSIS — Z6835 Body mass index (BMI) 35.0-35.9, adult: Secondary | ICD-10-CM | POA: Diagnosis not present

## 2019-11-28 DIAGNOSIS — K429 Umbilical hernia without obstruction or gangrene: Secondary | ICD-10-CM | POA: Insufficient documentation

## 2019-11-28 DIAGNOSIS — Z79899 Other long term (current) drug therapy: Secondary | ICD-10-CM | POA: Diagnosis not present

## 2019-11-28 DIAGNOSIS — E669 Obesity, unspecified: Secondary | ICD-10-CM | POA: Insufficient documentation

## 2019-11-28 HISTORY — PX: INCISIONAL HERNIA REPAIR: SHX193

## 2019-11-28 LAB — HEMOGLOBIN A1C
Hgb A1c MFr Bld: 5.7 % — ABNORMAL HIGH (ref 4.8–5.6)
Mean Plasma Glucose: 116.89 mg/dL

## 2019-11-28 LAB — TYPE AND SCREEN
ABO/RH(D): A NEG
Antibody Screen: POSITIVE
Unit division: 0
Unit division: 0
Weak D: POSITIVE

## 2019-11-28 LAB — BPAM RBC
Blood Product Expiration Date: 202105312359
Blood Product Expiration Date: 202106042359
Unit Type and Rh: 600
Unit Type and Rh: 600

## 2019-11-28 SURGERY — REPAIR, HERNIA, INCISIONAL, LAPAROSCOPIC
Anesthesia: General

## 2019-11-28 MED ORDER — PROPOFOL 10 MG/ML IV BOLUS
INTRAVENOUS | Status: DC | PRN
  Administered 2019-11-28: 140 mg via INTRAVENOUS

## 2019-11-28 MED ORDER — ONDANSETRON HCL 4 MG/2ML IJ SOLN
INTRAMUSCULAR | Status: DC | PRN
  Administered 2019-11-28: 4 mg via INTRAVENOUS

## 2019-11-28 MED ORDER — BUPIVACAINE-EPINEPHRINE 0.25% -1:200000 IJ SOLN
INTRAMUSCULAR | Status: DC | PRN
  Administered 2019-11-28: 30 mL

## 2019-11-28 MED ORDER — KETAMINE HCL 10 MG/ML IJ SOLN
INTRAMUSCULAR | Status: DC | PRN
Start: 2019-11-28 — End: 2019-11-28
  Administered 2019-11-28: 40 mg via INTRAVENOUS

## 2019-11-28 MED ORDER — BUPIVACAINE LIPOSOME 1.3 % IJ SUSP
20.0000 mL | Freq: Once | INTRAMUSCULAR | Status: DC
  Filled 2019-11-28: qty 20

## 2019-11-28 MED ORDER — LACTATED RINGERS IV SOLN: INTRAVENOUS | Status: DC

## 2019-11-28 MED ORDER — SUGAMMADEX SODIUM 200 MG/2ML IV SOLN
INTRAVENOUS | Status: DC | PRN
  Administered 2019-11-28: 200 mg via INTRAVENOUS

## 2019-11-28 MED ORDER — LIDOCAINE 20MG/ML (2%) 15 ML SYRINGE OPTIME
INTRAMUSCULAR | Status: DC | PRN
  Administered 2019-11-28: 1.5 mg/kg/h via INTRAVENOUS

## 2019-11-28 MED ORDER — CHLORHEXIDINE GLUCONATE CLOTH 2 % EX PADS: 6.0000 | MEDICATED_PAD | Freq: Once | CUTANEOUS | Status: DC

## 2019-11-28 MED ORDER — ACETAMINOPHEN 500 MG PO TABS
1000.0000 mg | ORAL_TABLET | ORAL | Status: AC
  Administered 2019-11-28: 1000 mg via ORAL
  Filled 2019-11-28: qty 2

## 2019-11-28 MED ORDER — PROPOFOL 10 MG/ML IV BOLUS
INTRAVENOUS | Status: AC
  Filled 2019-11-28: qty 20

## 2019-11-28 MED ORDER — MIDAZOLAM HCL 2 MG/2ML IJ SOLN
INTRAMUSCULAR | Status: AC
  Filled 2019-11-28: qty 2

## 2019-11-28 MED ORDER — BUPIVACAINE LIPOSOME 1.3 % IJ SUSP
INTRAMUSCULAR | Status: DC | PRN
  Administered 2019-11-28: 20 mL

## 2019-11-28 MED ORDER — OXYCODONE HCL 5 MG PO TABS
ORAL_TABLET | ORAL | Status: AC
  Filled 2019-11-28: qty 1

## 2019-11-28 MED ORDER — KETAMINE HCL 10 MG/ML IJ SOLN
INTRAMUSCULAR | Status: AC
  Filled 2019-11-28: qty 1

## 2019-11-28 MED ORDER — BUPIVACAINE-EPINEPHRINE 0.25% -1:200000 IJ SOLN
INTRAMUSCULAR | Status: AC
  Filled 2019-11-28: qty 1

## 2019-11-28 MED ORDER — 0.9 % SODIUM CHLORIDE (POUR BTL) OPTIME
TOPICAL | Status: DC | PRN
  Administered 2019-11-28: 1000 mL

## 2019-11-28 MED ORDER — MIDAZOLAM HCL 5 MG/5ML IJ SOLN
INTRAMUSCULAR | Status: DC | PRN
  Administered 2019-11-28 (×2): 1 mg via INTRAVENOUS

## 2019-11-28 MED ORDER — FENTANYL CITRATE (PF) 100 MCG/2ML IJ SOLN
INTRAMUSCULAR | Status: DC | PRN
  Administered 2019-11-28: 25 ug via INTRAVENOUS
  Administered 2019-11-28: 50 ug via INTRAVENOUS
  Administered 2019-11-28: 100 ug via INTRAVENOUS
  Administered 2019-11-28 (×2): 50 ug via INTRAVENOUS

## 2019-11-28 MED ORDER — OXYCODONE HCL 5 MG PO TABS
5.0000 mg | ORAL_TABLET | Freq: Once | ORAL | Status: AC | PRN
  Administered 2019-11-28: 5 mg via ORAL

## 2019-11-28 MED ORDER — LIDOCAINE 2% (20 MG/ML) 5 ML SYRINGE
INTRAMUSCULAR | Status: AC
  Filled 2019-11-28: qty 5

## 2019-11-28 MED ORDER — ROCURONIUM BROMIDE 100 MG/10ML IV SOLN
INTRAVENOUS | Status: DC | PRN
  Administered 2019-11-28: 60 mg via INTRAVENOUS

## 2019-11-28 MED ORDER — ONDANSETRON HCL 4 MG/2ML IJ SOLN
INTRAMUSCULAR | Status: AC
  Filled 2019-11-28: qty 2

## 2019-11-28 MED ORDER — OXYCODONE HCL 5 MG/5ML PO SOLN: 5.0000 mg | Freq: Once | ORAL | Status: AC | PRN

## 2019-11-28 MED ORDER — CEFAZOLIN SODIUM-DEXTROSE 2-4 GM/100ML-% IV SOLN
2.0000 g | INTRAVENOUS | Status: AC
  Administered 2019-11-28: 2 g via INTRAVENOUS
  Filled 2019-11-28: qty 100

## 2019-11-28 MED ORDER — LIDOCAINE HCL 2 % IJ SOLN
INTRAMUSCULAR | Status: AC
  Filled 2019-11-28: qty 20

## 2019-11-28 MED ORDER — DEXAMETHASONE SODIUM PHOSPHATE 10 MG/ML IJ SOLN
INTRAMUSCULAR | Status: DC | PRN
  Administered 2019-11-28: 8 mg via INTRAVENOUS

## 2019-11-28 MED ORDER — OXYCODONE HCL 5 MG PO TABS: 5.0000 mg | ORAL_TABLET | Freq: Three times a day (TID) | ORAL | 0 refills | Status: AC | PRN

## 2019-11-28 MED ORDER — ROCURONIUM BROMIDE 10 MG/ML (PF) SYRINGE
PREFILLED_SYRINGE | INTRAVENOUS | Status: AC
  Filled 2019-11-28: qty 10

## 2019-11-28 MED ORDER — FENTANYL CITRATE (PF) 250 MCG/5ML IJ SOLN
INTRAMUSCULAR | Status: AC
  Filled 2019-11-28: qty 5

## 2019-11-28 MED ORDER — LIDOCAINE HCL (CARDIAC) PF 100 MG/5ML IV SOSY
PREFILLED_SYRINGE | INTRAVENOUS | Status: DC | PRN
  Administered 2019-11-28: 50 mg via INTRAVENOUS

## 2019-11-28 SURGICAL SUPPLY — 48 items
BENZOIN TINCTURE PRP APPL 2/3 (GAUZE/BANDAGES/DRESSINGS) ×2 IMPLANT
BINDER ABDOMINAL 12 ML 46-62 (SOFTGOODS) IMPLANT
CABLE HIGH FREQUENCY MONO STRZ (ELECTRODE) IMPLANT
CHLORAPREP W/TINT 26 (MISCELLANEOUS) ×2 IMPLANT
COVER SURGICAL LIGHT HANDLE (MISCELLANEOUS) ×2 IMPLANT
COVER WAND RF STERILE (DRAPES) ×2 IMPLANT
DECANTER SPIKE VIAL GLASS SM (MISCELLANEOUS) ×2 IMPLANT
DERMABOND ADVANCED (GAUZE/BANDAGES/DRESSINGS) ×1
DERMABOND ADVANCED .7 DNX12 (GAUZE/BANDAGES/DRESSINGS) ×1 IMPLANT
DEVICE SECURE STRAP 25 ABSORB (INSTRUMENTS) ×1 IMPLANT
DEVICE TROCAR PUNCTURE CLOSURE (ENDOMECHANICALS) ×2 IMPLANT
DRAPE LAPAROSCOPIC ABDOMINAL (DRAPES) ×2 IMPLANT
DRSG TEGADERM 2-3/8X2-3/4 SM (GAUZE/BANDAGES/DRESSINGS) IMPLANT
ELECT REM PT RETURN 15FT ADLT (MISCELLANEOUS) ×2 IMPLANT
GLOVE BIO SURGEON STRL SZ7.5 (GLOVE) ×2 IMPLANT
GLOVE INDICATOR 8.0 STRL GRN (GLOVE) ×4 IMPLANT
GOWN STRL REUS W/TWL XL LVL3 (GOWN DISPOSABLE) ×4 IMPLANT
KIT BASIN (CUSTOM PROCEDURE TRAY) ×2 IMPLANT
KIT TURNOVER KIT A (KITS) IMPLANT
MARKER SKIN DUAL TIP RULER LAB (MISCELLANEOUS) ×2 IMPLANT
MESH VENTRALIGHT ST 4.5IN (Mesh General) ×1 IMPLANT
NDL INSUFFLATION 14GA 120MM (NEEDLE) ×1 IMPLANT
NDL SPNL 22GX3.5 QUINCKE BK (NEEDLE) ×1 IMPLANT
NEEDLE INSUFFLATION 14GA 120MM (NEEDLE) ×2 IMPLANT
NEEDLE SPNL 22GX3.5 QUINCKE BK (NEEDLE) ×2 IMPLANT
PAD POSITIONING PINK XL (MISCELLANEOUS) IMPLANT
PENCIL SMOKE EVACUATOR (MISCELLANEOUS) IMPLANT
PROTECTOR NERVE ULNAR (MISCELLANEOUS) IMPLANT
SCISSORS LAP 5X35 DISP (ENDOMECHANICALS) ×2 IMPLANT
SEALER TISSUE G2 STRG ARTC 35C (ENDOMECHANICALS) ×1 IMPLANT
SET IRRIG TUBING LAPAROSCOPIC (IRRIGATION / IRRIGATOR) IMPLANT
SET TUBE SMOKE EVAC HIGH FLOW (TUBING) ×2 IMPLANT
SHEARS HARMONIC ACE PLUS 36CM (ENDOMECHANICALS) IMPLANT
SLEEVE ADV FIXATION 5X100MM (TROCAR) ×4 IMPLANT
SLEEVE XCEL OPT CAN 5 100 (ENDOMECHANICALS) ×2 IMPLANT
STRIP CLOSURE SKIN 1/2X4 (GAUZE/BANDAGES/DRESSINGS) ×2 IMPLANT
SUT ETHIBOND 0 MO6 C/R (SUTURE) ×1 IMPLANT
SUT MNCRL AB 4-0 PS2 18 (SUTURE) ×2 IMPLANT
SUT NOVA NAB DX-16 0-1 5-0 T12 (SUTURE) ×1 IMPLANT
SUT PROLENE 0 CT 1 CR/8 (SUTURE) IMPLANT
TAPE CLOTH 4X10 WHT NS (GAUZE/BANDAGES/DRESSINGS) IMPLANT
TOWEL OR 17X26 10 PK STRL BLUE (TOWEL DISPOSABLE) ×2 IMPLANT
TRAY FOLEY MTR SLVR 16FR STAT (SET/KITS/TRAYS/PACK) IMPLANT
TRAY LAPAROSCOPIC (CUSTOM PROCEDURE TRAY) ×2 IMPLANT
TROCAR ADV FIXATION 5X100MM (TROCAR) IMPLANT
TROCAR BLADELESS OPT 5 100 (ENDOMECHANICALS) ×2 IMPLANT
TROCAR XCEL BLUNT TIP 100MML (ENDOMECHANICALS) IMPLANT
TROCAR XCEL NON-BLD 11X100MML (ENDOMECHANICALS) IMPLANT

## 2019-11-28 NOTE — Interval H&P Note (Signed)
History and Physical Interval Note:  11/28/2019 7:16 AM  Ann [name]  has presented today for surgery, with the diagnosis of INCISIONAL HERNIA.  The various methods of treatment have been discussed with the patient and family. After consideration of risks, benefits and other options for treatment, the patient has consented to  Procedure(s): [place] (N/A) as a surgical intervention.  The patient's history has been reviewed, patient examined, no change in status, stable for surgery.  I have reviewed the patient's chart and labs.  Questions were answered to the her satisfaction, she expressed understanding and has elected to proceed with surgery.   [place]

## 2019-11-28 NOTE — Op Note (Signed)
11/28/2019  9:11 AM  PATIENT:  Ann [name]  64 y.o. female  Patient Care Team: Berkley Harvey, NP as PCP - General (Nurse Practitioner)  PRE-OPERATIVE DIAGNOSIS:  Incisional supraumbilical hernia  POST-OPERATIVE DIAGNOSIS:  Same  PROCEDURE: 1. Laparoscopic incisional hernia repair with mesh 2. Laparoscopic lysis of adhesions x 30 minutes 3. Bilateral transversus abdominus plane blocks  SURGEON:  Sharon Mt. Dema Severin, MD  ASSISTANT: Greer Pickerel, MD  ANESTHESIA:   local and general  COUNTS:  Sponge, needle and instrument counts were reported correct x2 at the conclusion of the operation.  EBL: 5 mL  DRAINS: None  SPECIMEN: None  COMPLICATIONS: None  FINDINGS: Reducible incisional hernia - containing transverse colon - located in supraumbilical midline at apex of prior laparotomy incision. Adhesions between omentum and hernia sac were cleared. Defect was measured to be 3 x 3 cm. This was repaired with a 10 x 10 cm piece of Ventralex dual sided mesh after primary closure of the fascia.  DISPOSITION: PACU in satisfactory condition   INDICATION: Ms. Criswell is a very pleasant 52yoF whom presented to my office for evaluation of a reducible transverse colon containing supraumbilical incisional hernia. She has had prior laparoscopic cholecystectomy and laparotomy for sigmoidectomy. She developed this bulge following her most recent laparotomy. This was increasing in size despite intentional weight loss and becoming more uncomfortable over time. CT demonstrated this to contain colon. We had met, evaluated her and discussed options moving forward.  She opted to pursue surgery.  Please refer to notes elsewhere for details regarding this discussion.  DESCRIPTION: The patient was identified in preop holding and taken to the OR where she was placed on the operating room table. SCDs were placed. General endotracheal anesthesia was induced without difficulty. A foley catheter was placed under  sterile conditions by nursing. She was then prepped and draped in the usual sterile fashion. A surgical timeout was performed indicating the correct patient, procedure, positioning and need for preoperative antibiotics.   Given her prior surgical history, a Veress needle entry was selected for abdominal access.  An orogastric tube had been placed by anesthesia and was confirmed to be to suction.  At Palmer's point, a stab incision was created and the Veress needle introduced in the peritoneal cavity on the first attempt. Intraperitoneal location was confirmed with aspiration and saline drop test.  Insufflation then commenced with CO2 to max pressure of 15 mmHg.  At this location, a 5 mm Optiview trocar was placed under direct visualization.  The laparoscope was inserted and confirmed no evidence of trocar site or Veress needle site complications.  There were adhesions noted in the low midline abdomen.  Steering clear of these, 2 additional 5 mm trochars were placed in the left lateral abdomen.  Bilateral transversus abdominis plane blocks were then created using a dilute mixture of Exparel and 0.25% Marcaine.  Adhesiolysis then commenced taking down adhesions between the hernia sac and the omentum.  The transverse colon was identified but far away from our plane of dissection.  All adhesiolysis was performed sharply.  After completing this, the omentum and colon were inspected and noted to both be hemostatic and free of injury.  The hernia defect was then marked circumferentially using a spinal needle. The hernia measured 3 x 3 cm in size. A piece of Ventralex dual sided mesh was selected, 10 x 10 cm in size.  Over the location of the hernia, a scalpel was used to incise the skin and subcutaneous  tissue.  Under pneumoperitoneum, the hernia sac was entered.  The hernia sac was then excised using electrocautery, effectively clearing the fascial edges.  4 permanent sutures were placed at 12, 3, 6, and 9:00 on the  periphery of the mesh.  This was then rolled and placed into the abdominal cavity through the hernia incision.  Coker's were placed in the fascial edges and normal, healthy appearing fascia was present circumferentially.  This was then closed primarily using interrupted 0 Ethibond sutures.  This was done in a transverse manner as it resulted in a tension-free closure.  Pneumoperitoneum was then reestablished and the fascial defect palpated and noted to be obliterated.  The mesh was then secured circumferentially using the 4 cardinal sutures that were placed.  This resulted in a nice lay of the mesh against the peritoneum.  Orientation had been confirmed such that the coated/smooth side was down facing the abdominal cavity and the rough side against the abdominal wall.  The cardinal sutures were tied.  A double crown technique was then used to secure the mesh circumferentially using the secure strap tacking device.  There were no significant bubbles or exposed mesh surfaces.  It had a nice lay.  The abdomen is reinspected and hemostasis appreciated.  There is no injuries.  The inferior most laparoscopic trochars removed under direct visualization.  All incisions were then closed using 4-0 Monocryl subcuticular suture.  Incisions were covered with Dermabond.  An abdominal binder was placed.  She was then awakened from anesthesia, extubated, transferred to stretcher for transport to PACU in satisfactory condition.

## 2019-11-28 NOTE — Anesthesia Postprocedure Evaluation (Signed)
Anesthesia Post Note  Patient: Ann [name]  Procedure(s) Performed: LAPAROSCOPIC INCISIONAL HERNIA REPAIR WITH PLACEMENT OF MESH, LYSIS OF ADHESIONS (N/A )     Patient location during evaluation: PACU Anesthesia Type: General Level of consciousness: awake and alert Pain management: pain level controlled Vital Signs Assessment: post-procedure vital signs reviewed and stable Respiratory status: spontaneous breathing, nonlabored ventilation and respiratory function stable Cardiovascular status: blood pressure returned to baseline and stable Postop Assessment: no apparent nausea or vomiting Anesthetic complications: no    Last Vitals:  Vitals:   11/28/19 1000 11/28/19 1025  BP: 109/61 121/83  Pulse: 72 74  Resp: 14 18  Temp:  36.7 C  SpO2: 98% 92%                   Audry Pili

## 2019-11-28 NOTE — Progress Notes (Signed)
Patient complains of numb tongue . Called Dr Fransisco Beau. Probably related to indotrachial tube pressing on tongue. Patient states she understands.

## 2019-11-28 NOTE — Discharge Instructions (Addendum)
POST OP INSTRUCTIONS  1. DIET: As tolerated. Follow a light bland diet the first 24 hours after arrival home, such as soup, liquids, crackers, etc.  Be sure to include lots of fluids daily.  Avoid fast food or heavy meals as your are more likely to get nauseated.  Eat a low fat the next few days after surgery.  2. Take your usually prescribed home medications unless otherwise directed.  3. PAIN CONTROL: a. Pain is best controlled by a usual combination of three different methods TOGETHER: i. Ice/Heat ii. Over the counter pain medication iii. Prescription pain medication b. Most patients will experience some swelling and bruising around the surgical site.  Ice packs or heating pads (30-60 minutes up to 6 times a day) will help. Some people prefer to use ice alone, heat alone, alternating between ice & heat.  Experiment to what works for you.  Swelling and bruising can take several weeks to resolve.   c. It is helpful to take an over-the-counter pain medication regularly for the first few weeks: i. Ibuprofen (Motrin/Advil) - 200mg  tabs - take 3 tabs (600mg ) every 6 hours as needed for pain ii. Acetaminophen (Tylenol) - you may take 650mg  every 6 hours as needed. You can take this with motrin as they act differently on the body. If you are taking a narcotic pain medication that has acetaminophen in it, do not take over the counter tylenol at the same time.  Iii. NOTE: You may take both of these medications together - most patients  find it most helpful when alternating between the two (i.e. Ibuprofen at 6am, tylenol at 9am, ibuprofen at 12pm ...) d. A  prescription for pain medication should be given to you upon discharge.  Take your pain medication as prescribed if your pain is not adequatly controlled with the over-the-counter pain reliefs mentioned above.  4. Avoid getting constipated.  Between the surgery and the pain medications, it is common to experience some constipation.  Increasing fluid  intake and taking a fiber supplement (such as Metamucil, Citrucel, FiberCon, MiraLax, etc) 1-2 times a day regularly will usually help prevent this problem from occurring.  A mild laxative (prune juice, Milk of Magnesia, MiraLax, etc) should be taken according to package directions if there are no bowel movements after 48 hours.    5. Dressing: Your incisions are covered in Dermabond which is like sterile superglue for the skin. This will come off on it's own in a couple weeks. It is waterproof and you may bathe normally starting the day after your surgery in a shower. Avoid baths/pools/lakes/oceans until your wounds have fully healed.  6. ACTIVITIES as tolerated:   a. Avoid heavy lifting (>10lbs or 1 gallon of milk) for the next 6 weeks. b. You may resume regular (light) daily activities beginning the next day--such as daily self-care, walking, climbing stairs--gradually increasing activities as tolerated.  If you can walk 30 minutes without difficulty, it is safe to try more intense activity such as jogging, treadmill, bicycling, low-impact aerobics.  c. DO NOT PUSH THROUGH PAIN.  Let pain be your guide: If it hurts to do something, don't do it. d. Ann [name] may drive when you are no longer taking prescription pain medication, you can comfortably wear a seatbelt, and you can safely maneuver your car and apply brakes.  7. FOLLOW UP in our office a. Please call [facility] at [phone] to set up an appointment to see your surgeon in the office for a follow-up appointment approximately  2 weeks after your surgery. b. Make sure that you call for this appointment the day you arrive home to insure a convenient appointment time.  9. If you have disability or family leave forms that need to be completed, you may have them completed by your primary care physician's office; for return to work instructions, please ask our office staff and they will be happy to assist you in obtaining this documentation   When to call  us [phone]: 1. Poor pain control 2. Reactions / problems with new medications (rash/itching, etc)  3. Fever over 101.5 F (38.5 C) 4. Inability to urinate 5. Nausea/vomiting 6. Worsening swelling or bruising 7. Continued bleeding from incision. 8. Increased pain, redness, or drainage from the incision  The clinic staff is available to answer your questions during regular business hours (8:30am-5pm).  Please don't hesitate to call and ask to speak to one of our nurses for clinical concerns.   A surgeon from [facility] is always on call at the hospitals   If you have a medical emergency, go to the nearest emergency room or call 911.  [facility], [place] [address] [place], [place], [place]  [address] MAIN: [phone] FAX: [phone] [email or web address]    General Anesthesia, Adult, Care After This sheet gives you information about how to care for yourself after your procedure. Your health care provider may also give you more specific instructions. If you have problems or questions, contact your health care provider. What can I expect after the procedure? After the procedure, the following side effects are common:  Pain or discomfort at the IV site.  Nausea.  Vomiting.  Sore throat.  Trouble concentrating.  Feeling cold or chills.  Weak or tired.  Sleepiness and fatigue.  Soreness and body aches. These side effects can affect parts of the body that were not involved in surgery. Follow these instructions at home:  For at least 24 hours after the procedure:  Have a responsible adult stay with you. It is important to have someone help care for you until you are awake and alert.  Rest as needed.  Do not: ? Participate in activities in which you could fall or become injured. ? Drive. ? Use heavy machinery. ? Drink alcohol. ? Take sleeping pills or medicines that cause drowsiness. ? Make important decisions or  sign legal documents. ? Take care of children on your own. Eating and drinking  Follow any instructions from your health care provider about eating or drinking restrictions.  When you feel hungry, start by eating small amounts of foods that are soft and easy to digest (bland), such as toast. Gradually return to your regular diet.  Drink enough fluid to keep your urine pale yellow.  If you vomit, rehydrate by drinking water, juice, or clear broth. General instructions  If you have sleep apnea, surgery and certain medicines can increase your risk for breathing problems. Follow instructions from your health care provider about wearing your sleep device: ? Anytime you are sleeping, including during daytime naps. ? While taking prescription pain medicines, sleeping medicines, or medicines that make you drowsy.  Return to your normal activities as told by your health care provider. Ask your health care provider what activities are safe for you.  Take over-the-counter and prescription medicines only as told by your health care provider.  If you smoke, do not smoke without supervision.  Keep all follow-up visits as told by your health care provider. This  is important. Contact a health care provider if:  You have nausea or vomiting that does not get better with medicine.  You cannot eat or drink without vomiting.  You have pain that does not get better with medicine.  You are unable to pass urine.  You develop a skin rash.  You have a fever.  You have redness around your IV site that gets worse. Get help right away if:  You have difficulty breathing.  You have chest pain.  You have blood in your urine or stool, or you vomit blood. Summary  After the procedure, it is common to have a sore throat or nausea. It is also common to feel tired.  Have a responsible adult stay with you for the first 24 hours after general anesthesia. It is important to have someone help care for you  until you are awake and alert.  When you feel hungry, start by eating small amounts of foods that are soft and easy to digest (bland), such as toast. Gradually return to your regular diet.  Drink enough fluid to keep your urine pale yellow.  Return to your normal activities as told by your health care provider. Ask your health care provider what activities are safe for you. This information is not intended to replace advice given to you by your health care provider. Make sure you discuss any questions you have with your health care provider. Document Revised: 06/26/2017 Document Reviewed: 02/06/2017 Elsevier Patient Education  [place].

## 2019-11-28 NOTE — Anesthesia Procedure Notes (Signed)
Procedure Name: Intubation Date/Time: 11/28/2019 7:36 AM Performed by: Garrel Ridgel, CRNA Pre-anesthesia Checklist: Patient identified, Emergency Drugs available, Suction available and Patient being monitored Patient Re-evaluated:Patient Re-evaluated prior to induction Oxygen Delivery Method: Circle system utilized Preoxygenation: Pre-oxygenation with 100% oxygen Induction Type: IV induction Ventilation: Mask ventilation without difficulty Laryngoscope Size: Mac and 3 Grade View: Grade I Tube type: Oral Number of attempts: 1 Airway Equipment and Method: Stylet and Oral airway Placement Confirmation: ETT inserted through vocal cords under direct vision,  positive ETCO2 and breath sounds checked- equal and bilateral Tube secured with: Tape Dental Injury: Teeth and Oropharynx as per pre-operative assessment

## 2019-11-28 NOTE — Transfer of Care (Signed)
Immediate Anesthesia Transfer of Care Note  Patient: Ann [name]  Procedure(s) Performed: LAPAROSCOPIC INCISIONAL HERNIA REPAIR WITH PLACEMENT OF MESH, LYSIS OF ADHESIONS (N/A )  Patient Location: PACU  Anesthesia Type:General  Level of Consciousness: awake, drowsy and patient cooperative  Airway & Oxygen Therapy: Patient Spontanous Breathing and Patient connected to face mask oxygen  Post-op Assessment: Report given to RN and Post -op Vital signs reviewed and stable  Post vital signs: Reviewed and stable  Last Vitals:  Vitals Value Taken Time  BP 161/84 11/28/19 0915  Temp    Pulse 86 11/28/19 0918  Resp    SpO2 97 % 11/28/19 0918  Vitals shown include unvalidated device data.  Last Pain:  Vitals:   11/28/19 0618  TempSrc: Oral         Complications: No apparent anesthesia complications

## 2019-11-29 ENCOUNTER — Encounter: Payer: Self-pay | Admitting: *Deleted

## 2019-12-02 LAB — TYPE AND SCREEN
ABO/RH(D): A NEG
Antibody Screen: POSITIVE
Donor AG Type: NEGATIVE
Donor AG Type: NEGATIVE
Unit division: 0
Unit division: 0

## 2019-12-02 LAB — BPAM RBC
Blood Product Expiration Date: 202105312359
Blood Product Expiration Date: 202106042359
ISSUE DATE / TIME: 202105250834
Unit Type and Rh: 600
Unit Type and Rh: 600

## 2019-12-14 ENCOUNTER — Emergency Department (HOSPITAL_COMMUNITY): Payer: Medicaid Other

## 2019-12-14 ENCOUNTER — Other Ambulatory Visit: Payer: Self-pay

## 2019-12-14 ENCOUNTER — Encounter (HOSPITAL_COMMUNITY): Payer: Self-pay | Admitting: Emergency Medicine

## 2019-12-14 ENCOUNTER — Emergency Department (HOSPITAL_COMMUNITY)
Admission: EM | Admit: 2019-12-14 | Discharge: 2019-12-14 | Disposition: A | Discharge status: Home / Self Care | Payer: Medicaid Other | Attending: Emergency Medicine | Admitting: Emergency Medicine

## 2019-12-14 DIAGNOSIS — R4781 Slurred speech: Secondary | ICD-10-CM | POA: Insufficient documentation

## 2019-12-14 DIAGNOSIS — R531 Weakness: Secondary | ICD-10-CM | POA: Insufficient documentation

## 2019-12-14 DIAGNOSIS — Z79899 Other long term (current) drug therapy: Secondary | ICD-10-CM | POA: Insufficient documentation

## 2019-12-14 DIAGNOSIS — R519 Headache, unspecified: Secondary | ICD-10-CM

## 2019-12-14 DIAGNOSIS — R079 Chest pain, unspecified: Secondary | ICD-10-CM | POA: Insufficient documentation

## 2019-12-14 DIAGNOSIS — Z7982 Long term (current) use of aspirin: Secondary | ICD-10-CM | POA: Insufficient documentation

## 2019-12-14 DIAGNOSIS — J449 Chronic obstructive pulmonary disease, unspecified: Secondary | ICD-10-CM | POA: Diagnosis not present

## 2019-12-14 DIAGNOSIS — I10 Essential (primary) hypertension: Secondary | ICD-10-CM | POA: Diagnosis not present

## 2019-12-14 LAB — RAPID URINE DRUG SCREEN, HOSP PERFORMED
Amphetamines: NOT DETECTED
Barbiturates: NOT DETECTED
Benzodiazepines: NOT DETECTED
Cocaine: NOT DETECTED
Opiates: NOT DETECTED
Tetrahydrocannabinol: NOT DETECTED

## 2019-12-14 LAB — COMPREHENSIVE METABOLIC PANEL
ALT: 19 U/L (ref 0–44)
AST: 16 U/L (ref 15–41)
Albumin: 4 g/dL (ref 3.5–5.0)
Alkaline Phosphatase: 70 U/L (ref 38–126)
Anion gap: 8 (ref 5–15)
BUN: 24 mg/dL — ABNORMAL HIGH (ref 8–23)
CO2: 22 mmol/L (ref 22–32)
Calcium: 8.8 mg/dL — ABNORMAL LOW (ref 8.9–10.3)
Chloride: 112 mmol/L — ABNORMAL HIGH (ref 98–111)
Creatinine, Ser: 1 mg/dL (ref 0.44–1.00)
GFR calc Af Amer: >60 mL/min (ref >60)
GFR calc non Af Amer: >60 mL/min (ref >60)
Glucose, Bld: 105 mg/dL — ABNORMAL HIGH (ref 70–99)
Potassium: 3.9 mmol/L (ref 3.5–5.1)
Sodium: 142 mmol/L (ref 135–145)
Total Bilirubin: 0.7 mg/dL (ref 0.3–1.2)
Total Protein: 7.7 g/dL (ref 6.5–8.1)

## 2019-12-14 LAB — URINALYSIS, ROUTINE W REFLEX MICROSCOPIC
Bilirubin Urine: NEGATIVE
Glucose, UA: NEGATIVE mg/dL
Hgb urine dipstick: NEGATIVE
Ketones, ur: NEGATIVE mg/dL
Leukocytes,Ua: NEGATIVE
Nitrite: NEGATIVE
Protein, ur: NEGATIVE mg/dL
Specific Gravity, Urine: 1.018 (ref 1.005–1.030)
pH: 6 (ref 5.0–8.0)

## 2019-12-14 LAB — DIFFERENTIAL
Abs Immature Granulocytes: 0.02 10*3/uL (ref 0.00–0.07)
Basophils Absolute: 0 10*3/uL (ref 0.0–0.1)
Basophils Relative: 1 %
Eosinophils Absolute: 0.3 10*3/uL (ref 0.0–0.5)
Eosinophils Relative: 5 %
Immature Granulocytes: 0 %
Lymphocytes Relative: 33 %
Lymphs Abs: 1.9 10*3/uL (ref 0.7–4.0)
Monocytes Absolute: 0.4 10*3/uL (ref 0.1–1.0)
Monocytes Relative: 7 %
Neutro Abs: 3.2 10*3/uL (ref 1.7–7.7)
Neutrophils Relative %: 54 %

## 2019-12-14 LAB — CBC
HCT: 40.4 % (ref 36.0–46.0)
Hemoglobin: 12.8 g/dL (ref 12.0–15.0)
MCH: 31.7 pg (ref 26.0–34.0)
MCHC: 31.7 g/dL (ref 30.0–36.0)
MCV: 100 fL (ref 80.0–100.0)
Platelets: 359 K/uL (ref 150–400)
RBC: 4.04 MIL/uL (ref 3.87–5.11)
RDW: 15.1 % (ref 11.5–15.5)
WBC: 5.9 K/uL (ref 4.0–10.5)
nRBC: 0 % (ref 0.0–0.2)

## 2019-12-14 LAB — ETHANOL: Alcohol, Ethyl (B): <10 mg/dL

## 2019-12-14 LAB — I-STAT CHEM 8, ED
BUN: 21 mg/dL (ref 8–23)
Calcium, Ion: 1.25 mmol/L (ref 1.15–1.40)
Chloride: 110 mmol/L (ref 98–111)
Creatinine, Ser: 1.1 mg/dL — ABNORMAL HIGH (ref 0.44–1.00)
Glucose, Bld: 103 mg/dL — ABNORMAL HIGH (ref 70–99)
HCT: 38 % (ref 36.0–46.0)
Hemoglobin: 12.9 g/dL (ref 12.0–15.0)
Potassium: 3.9 mmol/L (ref 3.5–5.1)
Sodium: 143 mmol/L (ref 135–145)
TCO2: 22 mmol/L (ref 22–32)

## 2019-12-14 LAB — TROPONIN I (HIGH SENSITIVITY): Troponin I (High Sensitivity): 3 ng/L (ref <18)

## 2019-12-14 LAB — CBG MONITORING, ED: Glucose-Capillary: 99 mg/dL (ref 70–99)

## 2019-12-14 LAB — APTT: aPTT: 28 seconds (ref 24–36)

## 2019-12-14 LAB — PROTIME-INR
INR: 1 (ref 0.8–1.2)
Prothrombin Time: 12.5 s (ref 11.4–15.2)

## 2019-12-14 MED ORDER — LORAZEPAM 2 MG/ML IJ SOLN
1.0000 mg | Freq: Once | INTRAMUSCULAR | Status: AC
  Administered 2019-12-14: 1 mg via INTRAVENOUS
  Filled 2019-12-14: qty 1

## 2019-12-14 MED ORDER — IOHEXOL 350 MG/ML SOLN: 100.0000 mL | Freq: Once | INTRAVENOUS | Status: DC | PRN

## 2019-12-14 MED ORDER — PROMETHAZINE HCL 25 MG PO TABS
25.0000 mg | ORAL_TABLET | Freq: Four times a day (QID) | ORAL | 0 refills | Status: AC | PRN
Start: 2019-12-14 — End: ?

## 2019-12-14 MED ORDER — HYDROMORPHONE HCL 4 MG PO TABS: 4.0000 mg | ORAL_TABLET | Freq: Four times a day (QID) | ORAL | 0 refills | Status: AC | PRN

## 2019-12-14 NOTE — Discharge Instructions (Addendum)
Follow-up with your family doctor next week for recheck. 

## 2019-12-14 NOTE — Consult Note (Signed)
[facility] Tele[facility]urology Consult Services   Date of Service:   12/14/2019 11:46:49  Impression:     .  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 She needs MRI the brain, A1c, fasting lipid profile, echocardiogram, EKG monitoring.  Metrics: Last Known Well: 12/14/2019 10:00:00 [facility] Notification Time: 12/14/2019 96:78:93 Arrival Time: 12/14/2019 11:14:00 Symptom Onset During ED Stay: 12/14/2019 12:15:35 Stamp Time: 12/14/2019 11:46:49 Time First Login Attempt: 12/14/2019 11:55:55 Symptoms: Stuttering NIHSS Start Assessment Time: 12/14/2019 12:01:00 Patient is not a candidate for Alteplase/Activase. Alteplase Medical Decision: 12/14/2019 12:05:00 Patient was not deemed candidate for Alteplase/Activase thrombolytics because of following reasons: The ED physician I both agreed that the timing on onset of symptoms was vague. It is not clear that prior to 1030 she actually spoke to anybody. Additionally, it is of concern that she claims to have had a big stroke in the past an MRI done several months after the alleged stroke she had a normal study..  CT head showed no acute hemorrhage or acute core infarct. CT head was reviewed and results were: No acute changes  Radiologist was not called back for review of advanced imaging because Not indicated  Advanced Imaging: Advanced Imaging Not Recommended because:  Clinical Presentation is not Suggestive of LVO and NIHSS is <6   Our recommendations are outlined below.  Recommendations:     .  Activate Stroke Protocol Admission/Order Set     .  Stroke/Telemetry Floor     .  Neuro Checks     .  Bedside Swallow Eval     .  DVT Prophylaxis     .  IV Fluids, Normal Saline     .  Head of Bed 30 Degrees     .  Euglycemia and Avoid  Hyperthermia (PRN Acetaminophen)     .  Initiate Aspirin  Routine Consultation with [facility] for Follow up Care  Sign Out:     .  Discussed with Emergency Department Provider    ------------------------------------------------------------------------------  History of Present Illness: Patient is a 64 year old Female.   This 64 year old woman did not sleep all night. She is had chest tightness for about a week. She says that approximate 1030 she started having stuttering speech. The timing seems vague to me because she had no one to talk to before this. She claims that she's had TIAs in the past and that she had a stroke in 2019 while she was sitting in the hospital caring for her now late husband. She was supposed to be at [place] office this morning for a postop check as she had a repair of an umbilical hernia two weeks ago. She has congestive heart failure for reasons unknown. She has B12 deficiency and she is on replacement with injections. 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  There is no history of hemorrhagic complications or intracranial hemorrhage. There is no history of Recent Anticoagulants. There is no history of recent major surgery. There is no history of recent stroke.  Past Medical History:     . Hypertension     . There is NO history of Diabetes Mellitus     . There is NO history of Hyperlipidemia     . There is NO history of  Atrial Fibrillation     . There is NO history of Coronary Artery Disease     . There is NO history of Stroke  Anticoagulant use:  No  Antiplatelet use: Aspirin     Examination: BP(150/86), Pulse(61), Blood Glucose(130) 1A: Level of Consciousness - Alert; keenly responsive + 0 1B: Ask Month and Age - Both Questions Right + 0 1C: Blink Eyes & Squeeze Hands - Performs Both Tasks + 0 2: Test Horizontal Extraocular Movements - Normal +  0 3: Test Visual Fields - No Visual Loss + 0 4: Test Facial Palsy (Use Grimace if Obtunded) - Normal symmetry + 0 5A: Test Left Arm Motor Drift - No Drift for 10 Seconds + 0 5B: Test Right Arm Motor Drift - No Drift for 10 Seconds + 0 6A: Test Left Leg Motor Drift - No Drift for 5 Seconds + 0 6B: Test Right Leg Motor Drift - No Drift for 5 Seconds + 0 7: Test Limb Ataxia (FNF/Heel-Shin) - No Ataxia + 0 8: Test Sensation - Normal; No sensory loss + 0 9: Test Language/Aphasia - Mild-Moderate Aphasia: Some Obvious Changes, Without Significant Limitation + 1 10: Test Dysarthria - Normal + 0 11: Test Extinction/Inattention - No abnormality + 0  NIHSS Score: 1  Pre-Morbid Modified Ranking Scale: 1 Points = No significant disability despite symptoms; able to carry out all usual duties and activities   Patient/Family was informed the Neurology Consult would occur via TeleHealth consult by way of interactive audio and video telecommunications and consented to receiving care in this manner.   Patient is being evaluated for possible acute neurologic impairment and high probability of imminent or life-threatening deterioration. I spent total of 25 minutes providing care to this patient, including time for face to face visit via telemedicine, review of medical records, imaging studies and discussion of findings with providers, the patient and/or family.   Dr Cindie Laroche   [facility] [phone]  Case [id number]

## 2019-12-14 NOTE — ED Provider Notes (Signed)
[facility] DEPT Provider Note   CSN: [id number] Arrival date & time: 12/14/19  1114     History Chief Complaint  Patient presents with  . Aphasia  . Headache  . Chest Pain    Ann [name] is a 64 y.o. female.  Patient started around 10:00 with slurred speech.  Patient states that she has had a stroke before  The history is provided by the patient and medical records. No language interpreter was used.  Weakness Severity:  Mild Onset quality:  Sudden Timing:  Intermittent Chronicity:  New Context: not alcohol use   Relieved by:  Nothing Worsened by:  Nothing Ineffective treatments:  None tried Associated symptoms: no abdominal pain, no chest pain, no cough, no diarrhea, no frequency, no headaches and no seizures   Risk factors: no anemia        Past Medical History:  Diagnosis Date  . Adenoma    throat  . Adenoma of colon   . Allergy    seasonal  . Anemia    with past pregnancy -not recent  . Anxiety   . Arthritis   . Blood transfusion without reported diagnosis   . Borderline diabetes    diet controlled - no meds, history of  . CHF (congestive heart failure) ([place])   . Chronic cystitis   . CKD (chronic kidney disease), stage II   . Complication of anesthesia    02-20-2014 ([facility]) INTRA-OP RESPIRATORY FAILURE SECONDARY TO POSSIBLE MUCOUS ASPIRATION/  ALSO 06-06-2013 [facility]) POST-OP DESATURATION  EVEN USING CPAP, States some issues if Propofol given to rapidly.  Marland Kitchen COPD (chronic obstructive pulmonary disease) ([place])   . Diverticulitis   . Diverticulosis of colon   . Dyspnea    only needs O2 at Night 4L  . Family history of adverse reaction to anesthesia    PER PT SISTER DIED AFTER GENERAL ANESTHESIA DUE TO UNDIAGNOSED OSA  . GERD (gastroesophageal reflux disease)   . H/O hiatal hernia   . Headache(784.0)    migraines, decreased since turning 50's  . History of chronic cough    "tight sounding cough"  . History of esophageal  dilatation   . History of kidney stones   . History of MRSA infection    3/ 2015  AXILLARY ABSCESS  . History of recurrent UTIs   . History of TIAs NO RESIDUAL   1980;  2005;   2008 PT STATES PER CT  SCARRING RIGHT SIDE OF BRAIN  . Hyperlipidemia    currently controlled   . Hypertension   . Inguinal hernia   . Neuromuscular disorder ([place])    HH  . Neuropathy    pt denies this 07-05-2015  . Nocturia   . OSA on CPAP    PER SLEEP STUDY 09-08-2012  MODERATE OSA. not used in awhile, but thinks needs to start back using due to some weight gain.  . Osteoarthritis of knee    Right  . Pneumonia   . Skin cancer   . Sleep apnea    off cpap due to weight loss   . Stroke [facility] 1980,2008,2012,2018   TIA'S slight weakness on lt leg  . SUI (stress urinary incontinence, female)   . SVD (spontaneous vaginal delivery)    x 3    Patient Active Problem List   Diagnosis Date Noted  . S/P TKR (total knee replacement), right 06/27/2019  . Total knee replacement status, right 06/27/2019  . Hydronephrosis, right 05/08/2019  . S/P total  knee arthroplasty, left 03/28/2019  . Degenerative arthritis of left knee 03/18/2019  . Rash 08/10/2018  . Pruritic rash 08/09/2018  . Headache 08/09/2018  . Osteoarthritis of right knee 08/28/2017  . Abnormal CT scan, colon   . HLD (hyperlipidemia) 06/03/2017  . Stroke (cerebrum) ([place]) 06/03/2017  . GERD (gastroesophageal reflux disease) 06/03/2017  . Anxiety 06/03/2017  . Chronic combined systolic (congestive) and diastolic (congestive) heart failure ([place]) 06/03/2017  . Hypokalemia 06/03/2017  . Diverticulitis large intestine 06/03/2017  . Lower abdominal pain 06/03/2017  . Non-intractable vomiting with nausea   . Abnormal CT of the abdomen   . Generalized abdominal pain   . LFT elevation   . Diverticulitis of colon 05/18/2017  . Symptomatic cholelithiasis 02/10/2017  . Hypoxia 01/11/2017  . Dyspnea 01/11/2017  . Dysphagia 04/04/2015  . History of  esophageal stricture 04/04/2015  . Morbid (severe) obesity due to excess calories ([place]) 04/01/2015  . ACE-inhibitor cough 03/12/2015  . Upper airway cough syndrome 03/12/2015  . Laryngopharyngeal reflux (LPR) 03/12/2015  . Elevated lipids 07/18/2014  . Chest pain 07/18/2014  . Internal hemorrhoids 04/25/2014  . Ureteral stone with hydronephrosis 04/22/2014  . Post-op pain 04/21/2014  . Rectocele, grade 3 02/20/2014  . Female rectocele without uterine prolapse 08/04/2013  . Unspecified constipation 07/23/2013  . Chronic respiratory failure with hypoxia ([place]) 06/07/2013  . Cystocele 06/06/2013  . Anosmia 01/25/2013  . Unspecified nutritional deficiency   . Polyneuropathy in other diseases classified elsewhere ([place])   . OSA (obstructive sleep apnea)   . Cough 07/27/2012  . Essential hypertension     Past Surgical History:  Procedure Laterality Date  . ANTERIOR AND POSTERIOR REPAIR N/A 06/06/2013   Procedure: Anterior vaginal vault repair, Sacrospinous ligament fixation with UPHOLD lite, Kelly plication, Sacrospinous mesh fixation, Solyx transurethral sling;  Surgeon: Ailene Rud, MD;  Location: [facility];  Service: Urology;  Laterality: N/A;  . CHOLECYSTECTOMY N/A 02/10/2017   Procedure: LAPAROSCOPIC CHOLECYSTECTOMY;  Surgeon: Coralie Keens, MD;  Location: [facility];  Service: General;  Laterality: N/A;  . COLECTOMY WITH COLOSTOMY CREATION/HARTMANN PROCEDURE N/A 06/11/2017   Procedure: OPEN SIGMOID COLECTOMY;  Surgeon: [name], MD;  [facility];  Service: General;  Laterality: N/A;  . COLONOSCOPY  08/2016   polyps - repeat 5 years Armbruster  . CYSTOSCOPY W/ URETERAL STENT PLACEMENT Right 04/21/2014   Procedure: CYSTOSCOPY WITH RETROGRADE PYELOGRAM/URETERAL STENT PLACEMENT;  Surgeon: Ailene Rud, MD;  [facility];  Service: Urology;  Laterality: Right;  . CYSTOSCOPY W/ URETERAL STENT PLACEMENT Right 11/21/2014   Procedure: CYSTOSCOPY WITH  RETROGRADE PYELOGRAM, URETEROSCOPY WITH STONE REMOVAL, URETERAL STENT PLACEMENT;  Surgeon: Carolan Clines, MD;  [facility];  Service: Urology;  Laterality: Right;  . CYSTOSCOPY W/ URETERAL STENT PLACEMENT Left 02/01/2016   Procedure: CYSTO, LEFT URETEROSCOPY, LEFT RETROGRADE AND LEFT URETERAL STENT PLACEMENT;  Surgeon: Carolan Clines, MD;  [facility];  Service: Urology;  Laterality: Left;  . CYSTOSCOPY W/ URETERAL STENT PLACEMENT Right 05/08/2019   Procedure: CYSTOSCOPY WITH RETROGRADE PYELOGRAM/URETERAL STENT PLACEMENT;  Surgeon: Festus Aloe, MD;  [facility];  Service: Urology;  Laterality: Right;  . CYSTOSCOPY WITH RETROGRADE PYELOGRAM, URETEROSCOPY AND STENT PLACEMENT Right 06/05/2014   Procedure: CYSTOSCOPY WITH RIGHT RETROGRADE PYELOGRAM, RIGHT URETEROSCOPY, STONE EXTRACTION AND RIGHT DOUBLE J STENT PLACEMENT;  Surgeon: Ailene Rud, MD;  Location[facility];  Service: Urology;  Laterality: Right;  . CYSTOSCOPY WITH STENT PLACEMENT N/A 08/30/2019   Procedure: CYSTOSCOPY WITH  STENT PLACEMENT; RIGHT RETROGRADE PYELOGRAM;  Surgeon: Ardis Hughs, MD;  [facility];  Service: Urology;  Laterality: N/A;  . CYSTOSCOPY/URETEROSCOPY/HOLMIUM LASER/STENT PLACEMENT Right 05/23/2019   Procedure: CYSTOSCOPY/URETEROSCOPY/STENT EXCHANGE;  Surgeon: Festus Aloe, MD;  [facility];  Service: Urology;  Laterality: Right;  ONLY NEEDS 60 MIN  . CYSTOSCOPY/URETEROSCOPY/HOLMIUM LASER/STENT PLACEMENT Right 09/08/2019   Procedure: CYSTOSCOPY/URETEROSCOPY/STENT EXCHANGE;  Surgeon: Ardis Hughs, MD;  [facility];  Service: Urology;  Laterality: Right;  . ESOPHAGEAL DILATION  X2  LAST ONE  JUNE 2014   has had 9 of these  . ESOPHAGEAL MANOMETRY N/A 09/10/2015   Procedure: ESOPHAGEAL MANOMETRY (EM);  Surgeon: Manus Gunning, MD;  [facility];  Service: Gastroenterology;  Laterality: N/A;  . FRACTURE SURGERY  09/2018    broken nose no surgery  . HOLMIUM LASER APPLICATION Right 33/00/7622   Procedure: HOLMIUM LASER OF STONE ;  Surgeon: Ailene Rud, MD;  Location: [facility];  Service: Urology;  Laterality: Right;  . INCISIONAL HERNIA REPAIR N/A 11/28/2019   Procedure: LAPAROSCOPIC INCISIONAL HERNIA REPAIR WITH PLACEMENT OF MESH, LYSIS OF ADHESIONS;  Surgeon: Ileana Roup, MD;  [facility];  Service: General;  Laterality: N/A;  . KNEE ARTHROSCOPY Bilateral 2008   x 2  . LAPAROSCOPIC CHOLECYSTECTOMY  02/10/2017  . MULTIPLE TOOTH EXTRACTIONS     past hx  . POLYPECTOMY    . RECTOCELE REPAIR N/A 02/20/2014   Procedure: POSTERIOR REPAIR (RECTOCELE) WITH VAGINAL VAULT REPAIR WITH ACELL GRAFT PLACEMENT, PERINEOPLASTY, ENTEROCELE REPAIR;  Surgeon: Ailene Rud, MD;  [facility];  Service: Urology;  Laterality: N/A;  . TOTAL KNEE ARTHROPLASTY Left 03/28/2019   Procedure: LEFT TOTAL KNEE ARTHROPLASTY;  Surgeon: Frederik Pear, MD;  [facility];  Service: Orthopedics;  Laterality: Left;  . TOTAL KNEE ARTHROPLASTY Right 06/27/2019   Procedure: RIGHT TOTAL KNEE ARTHROPLASTY;  Surgeon: Frederik Pear, MD;  [facility];  Service: Orthopedics;  Laterality: Right;  . TUBAL LIGATION  1987  . UPPER GASTROINTESTINAL ENDOSCOPY  07/29/2018   Armbruster - need to repeat per MD  . Arnetha Courser TOOTH EXTRACTION       OB History   No obstetric history on file.     Family History  Problem Relation Age of Onset  . Colon cancer Father 12       died at 56  . Heart disease Mother        died at 37  . Heart attack Mother   . Heart attack Sister        died at 24  . Hypertension Brother   . Heart attack Cousin 62       with death  . Heart attack Cousin 52       died with MI  . Esophageal cancer Neg Hx   . Rectal cancer Neg Hx   . Stomach cancer Neg Hx   . Colon polyps Neg Hx     Social History   Tobacco Use  . Smoking status: Never Smoker  . Smokeless tobacco: Never  Used  Substance Use Topics  . Alcohol use: No    Alcohol/week: 0.0 standard drinks  . Drug use: No    Home Medications Prior to Admission medications   Medication Sig Start Date End Date Taking? Authorizing Provider  amLODipine (NORVASC) 10 MG tablet Take 10 mg by mouth daily.    Yes [provider]  aspirin EC 81 MG tablet Take 81 mg by mouth  at bedtime.    Yes [provider]  atorvastatin (LIPITOR) 20 MG tablet Take 10 mg by mouth at bedtime.  05/15/17  Yes [provider]  clonazePAM (KLONOPIN) 0.5 MG tablet Take 0.5 mg by mouth at bedtime. Can take BID   Yes [provider]  DULoxetine (CYMBALTA) 30 MG capsule Take 30 mg by mouth at bedtime.  03/21/19  Yes [provider]  hydrochlorothiazide (HYDRODIURIL) 25 MG tablet Take 25 mg by mouth daily as needed (swelling).    Yes [provider]  metoprolol succinate (TOPROL-XL) 50 MG 24 hr tablet Take 50 mg by mouth at bedtime.  08/08/15  Yes [provider]  mirabegron ER (MYRBETRIQ) 25 MG TB24 tablet Take 25 mg by mouth at bedtime.    Yes [provider]  omeprazole (PRILOSEC) 40 MG capsule TAKE 1 CAPSULE(40 MG) BY MOUTH TWICE DAILY Patient taking differently: Take 40 mg by mouth 2 (two) times daily.  09/17/16  Yes Armbruster, [name], MD  topiramate (TOPAMAX) 100 MG tablet Take 1 tablet (100 mg total) by mouth 2 (two) times daily. 01/06/19  Yes Marcial Pacas, MD  traZODone (DESYREL) 150 MG tablet Take 150 mg by mouth at bedtime as needed for sleep.  05/20/18  Yes [provider]  HYDROmorphone (DILAUDID) 4 MG tablet Take 1 tablet (4 mg total) by mouth every 6 (six) hours as needed for severe pain. 12/14/19   Milton Ferguson, MD  OXYGEN Inhale 4 L into the lungs at bedtime. Pt uses PRN-  Not used in > 6 months per TE- pt states she has and can use with CPAP if needed-    [provider]  promethazine (PHENERGAN) 25 MG tablet Take 1 tablet (25 mg total) by mouth  every 6 (six) hours as needed for nausea or vomiting. 12/14/19   Milton Ferguson, MD  iron polysaccharides (NIFEREX) 150 MG capsule Take 1 capsule (150 mg total) by mouth daily. Patient not taking: Reported on 06/22/2019 05/14/19 08/29/19  Raiford Noble Latif, DO    Allergies    Lisinopril, Lidocaine, Doxycycline, and Metronidazole  Review of Systems   Review of Systems  Constitutional: Negative for appetite change and fatigue.  HENT: Negative for congestion, ear discharge and sinus pressure.   Eyes: Negative for discharge.  Respiratory: Negative for cough.   Cardiovascular: Negative for chest pain.  Gastrointestinal: Negative for abdominal pain and diarrhea.  Genitourinary: Negative for frequency and hematuria.  Musculoskeletal: Negative for back pain.  Skin: Negative for rash.  Neurological: Positive for weakness. Negative for seizures and headaches.       Slurred speech  Psychiatric/Behavioral: Negative for hallucinations.    Physical Exam Updated Vital Signs BP 134/80   Pulse 61   Temp 98.8 F (37.1 C) (Oral)   Resp (!) 21   Ht 5\' 2"  (1.575 m)   Wt 88.6 kg   SpO2 97%   BMI 35.75 kg/m   Physical Exam Vitals and nursing note reviewed.  Constitutional:      Appearance: She is well-developed.  HENT:     Head: Normocephalic.  Eyes:     General: No scleral icterus.    Conjunctiva/sclera: Conjunctivae normal.  Neck:     Thyroid: No thyromegaly.  Cardiovascular:     Rate and Rhythm: Normal rate and regular rhythm.     Heart sounds: No murmur heard.  No friction rub. No gallop.   Pulmonary:     Breath sounds: No stridor. No wheezing or rales.  Chest:     Chest wall: No tenderness.  Abdominal:     General: There is no distension.     Tenderness: There is no abdominal tenderness. There is no rebound.  Musculoskeletal:        General: Normal range of motion.     Cervical back: Neck supple.  Lymphadenopathy:     Cervical: No cervical adenopathy.  Skin:    Findings:  No erythema or rash.  Neurological:     Mental Status: She is alert and oriented to person, place, and time.     Motor: No abnormal muscle tone.     Coordination: Coordination normal.     Comments: Mild slurred speech  Psychiatric:        Behavior: Behavior normal.     ED Results / Procedures / Treatments   Labs (all labs ordered are listed, but only abnormal results are displayed) Labs Reviewed  COMPREHENSIVE METABOLIC PANEL - Abnormal; Notable for the following components:      Result Value   Chloride 112 (*)    Glucose, Bld 105 (*)    BUN 24 (*)    Calcium 8.8 (*)    All other components within normal limits  I-STAT CHEM 8, ED - Abnormal; Notable for the following components:   Creatinine, Ser 1.10 (*)    Glucose, Bld 103 (*)    All other components within normal limits  ETHANOL  PROTIME-INR  APTT  CBC  DIFFERENTIAL  RAPID URINE DRUG SCREEN, HOSP PERFORMED  URINALYSIS, ROUTINE W REFLEX MICROSCOPIC  CBG MONITORING, ED  TROPONIN I (HIGH SENSITIVITY)    EKG None  Radiology MR BRAIN WO CONTRAST  Result Date: 12/14/2019 CLINICAL DATA:  Aphasia and headache. EXAM: MRI HEAD WITHOUT CONTRAST TECHNIQUE: Multiplanar, multiecho pulse sequences of the brain and surrounding structures were obtained without intravenous contrast. COMPARISON:  Head CT 12/14/2019 and MRI 08/10/2018 FINDINGS: Brain: There is no evidence of acute infarct, intracranial hemorrhage, mass, midline shift, or extra-axial fluid collection. The ventricles and sulci are normal. A 1 cm cystic focus inferior to the left basal ganglia is unchanged from the prior MRI and, despite the presence of mild surrounding gliosis, is favored to represent a dilated perivascular space given its location rather than a chronic lacunar infarct. A few small foci of T2 hyperintensity in the cerebral white matter bilaterally are nonspecific and not considered abnormal for age. Vascular: Major intracranial vascular flow voids are  preserved. Skull and upper cervical spine: Unremarkable bone marrow signal. Sinuses/Orbits: Unremarkable orbits. Paranasal sinuses and mastoid air cells are clear. Other: None. IMPRESSION: Unchanged appearance of the brain. No acute intracranial abnormality. Electronically Signed   By: Logan Bores M.D.   On: 12/14/2019 14:37   CT HEAD CODE STROKE WO CONTRAST  Result Date: 12/14/2019 CLINICAL DATA:  Code stroke. Ataxia. Aphasia and headache. Symptoms began 3-4 hours ago. EXAM: CT HEAD WITHOUT CONTRAST TECHNIQUE: Contiguous axial images were obtained from the base of the skull through the vertex without intravenous contrast. COMPARISON:  08/08/2018 CT.  08/10/2018 MRI. FINDINGS: Brain: Normal appearance without evidence of old or acute infarction, mass lesion, hemorrhage, hydrocephalus or extra-axial collection. Dilated perivascular space at the base of the brain on the left. Vascular: No abnormal vascular finding. Skull: Normal Sinuses/Orbits: Clear/normal Other: None ASPECTS ([place] Stroke Program Early CT Score) - Ganglionic level infarction (caudate, lentiform nuclei, internal capsule, insula, M1-M3 cortex): 7 - Supraganglionic infarction (M4-M6 cortex): 3 Total score (0-10 with 10 being normal): 10 IMPRESSION: 1. Normal  head CT. Incidental dilated perivascular space at the base of the brain on the left. 2. ASPECTS is 10. 3. These results were called by telephone at the time of interpretation on 12/14/2019 at 11:56 am to provider [facility] Jasmynn Pfalzgraf , who verbally acknowledged these results. Electronically Signed   By: Nelson Chimes M.D.   On: 12/14/2019 11:58    Procedures Procedures (including critical care time)  Medications Ordered in ED Medications  iohexol (OMNIPAQUE) 350 MG/ML injection 100 mL (has no administration in time range)  LORazepam (ATIVAN) injection 1 mg (1 mg Intravenous Given 12/14/19 1345)    ED Course  I have reviewed the triage vital signs and the nursing notes.  Pertinent labs &  imaging results that were available during my care of the patient were reviewed by me and considered in my medical decision making (see chart for details).    CRITICAL CARE Performed by: Milton Ferguson Total critical care time: 36minutes Critical care time was exclusive of separately billable procedures and treating other patients. Critical care was necessary to treat or prevent imminent or life-threatening deterioration. Critical care was time spent personally by me on the following activities: development of treatment plan with patient and/or surrogate as well as nursing, discussions with consultants, evaluation of patient's response to treatment, examination of patient, obtaining history from patient or surrogate, ordering and performing treatments and interventions, ordering and review of laboratory studies, ordering and review of radiographic studies, pulse oximetry and re-evaluation of patient's condition. Patient with stroke symptoms.  Patient was seen by neurology and CT scan was negative.  Neurology does not think the patient is having a stroke and recommended an MRI.  MRI was negative.  Patient was sent home with migraine variant MDM Rules/Calculators/A&P                      Patient with a migraine variant.  She is discharged with pain medicine nausea medicine and will follow up with her PCP, patient was seen by telemetry neuro        This patient presents to the ED for concern of slurred speech, this involves an extensive number of treatment options, and is a complaint that carries with it a high risk of complications and morbidity.  The differential diagnosis includes stroke migraine variant   Lab Tests:   I Ordered, reviewed, and interpreted labs, which included CBC and chemistries unremarkable  Medicines ordered:   I ordered medication Phenergan for nausea  Imaging Studies ordered:   I ordered imaging studies which included CT head and MRI of the head and  I  independently visualized and interpreted imaging which showed unremarkable  Additional history obtained:   Additional history obtained from neurologist  Previous records obtained and reviewed   Consultations Obtained:   I consulted neurologist and discussed lab and imaging findings  Reevaluation:  After the interventions stated above, I reevaluated the patient and found improved  Critical Interventions:  .   Final Clinical Impression(s) / ED Diagnoses Final diagnoses:  Bad headache    Rx / DC Orders ED Discharge Orders         Ordered    promethazine (PHENERGAN) 25 MG tablet  Every 6 hours PRN     12/14/19 1455    HYDROmorphone (DILAUDID) 4 MG tablet  Every 6 hours PRN     12/14/19 1455           Milton Ferguson, MD 12/21/19 1128

## 2019-12-14 NOTE — ED Triage Notes (Signed)
Patient reports intermittent central chest tightness x1 week. States aphasia and headache since this morning at approximately 0900. Hx stroke and TIA.

## 2020-01-11 ENCOUNTER — Emergency Department (HOSPITAL_COMMUNITY): Payer: Medicaid Other

## 2020-01-11 ENCOUNTER — Emergency Department (HOSPITAL_COMMUNITY)
Admission: EM | Admit: 2020-01-11 | Discharge: 2020-01-11 | Disposition: A | Discharge status: Home / Self Care | Payer: Medicaid Other | Attending: Emergency Medicine | Admitting: Emergency Medicine

## 2020-01-11 ENCOUNTER — Other Ambulatory Visit: Payer: Self-pay

## 2020-01-11 ENCOUNTER — Encounter (HOSPITAL_COMMUNITY): Payer: Self-pay | Admitting: Emergency Medicine

## 2020-01-11 DIAGNOSIS — R112 Nausea with vomiting, unspecified: Secondary | ICD-10-CM | POA: Insufficient documentation

## 2020-01-11 DIAGNOSIS — R197 Diarrhea, unspecified: Secondary | ICD-10-CM

## 2020-01-11 DIAGNOSIS — Z96653 Presence of artificial knee joint, bilateral: Secondary | ICD-10-CM | POA: Diagnosis not present

## 2020-01-11 DIAGNOSIS — N3001 Acute cystitis with hematuria: Secondary | ICD-10-CM | POA: Diagnosis not present

## 2020-01-11 DIAGNOSIS — I509 Heart failure, unspecified: Secondary | ICD-10-CM | POA: Diagnosis not present

## 2020-01-11 DIAGNOSIS — Z79899 Other long term (current) drug therapy: Secondary | ICD-10-CM | POA: Diagnosis not present

## 2020-01-11 DIAGNOSIS — J449 Chronic obstructive pulmonary disease, unspecified: Secondary | ICD-10-CM | POA: Diagnosis not present

## 2020-01-11 DIAGNOSIS — R1010 Upper abdominal pain, unspecified: Secondary | ICD-10-CM | POA: Diagnosis present

## 2020-01-11 DIAGNOSIS — I130 Hypertensive heart and chronic kidney disease with heart failure and stage 1 through stage 4 chronic kidney disease, or unspecified chronic kidney disease: Secondary | ICD-10-CM | POA: Insufficient documentation

## 2020-01-11 DIAGNOSIS — N182 Chronic kidney disease, stage 2 (mild): Secondary | ICD-10-CM | POA: Diagnosis not present

## 2020-01-11 DIAGNOSIS — Z7982 Long term (current) use of aspirin: Secondary | ICD-10-CM | POA: Insufficient documentation

## 2020-01-11 DIAGNOSIS — Z8673 Personal history of transient ischemic attack (TIA), and cerebral infarction without residual deficits: Secondary | ICD-10-CM | POA: Insufficient documentation

## 2020-01-11 LAB — URINALYSIS, ROUTINE W REFLEX MICROSCOPIC
Bilirubin Urine: NEGATIVE
Glucose, UA: NEGATIVE mg/dL
Hgb urine dipstick: NEGATIVE
Ketones, ur: NEGATIVE mg/dL
Nitrite: NEGATIVE
Protein, ur: 100 mg/dL — AB
Specific Gravity, Urine: 1.025 (ref 1.005–1.030)
WBC, UA: >50 WBC/hpf — ABNORMAL HIGH (ref 0–5)
pH: 5 (ref 5.0–8.0)

## 2020-01-11 LAB — COMPREHENSIVE METABOLIC PANEL
ALT: 14 U/L (ref 0–44)
AST: 16 U/L (ref 15–41)
Albumin: 4.3 g/dL (ref 3.5–5.0)
Alkaline Phosphatase: 59 U/L (ref 38–126)
Anion gap: 11 (ref 5–15)
BUN: 14 mg/dL (ref 8–23)
CO2: 25 mmol/L (ref 22–32)
Calcium: 9.3 mg/dL (ref 8.9–10.3)
Chloride: 107 mmol/L (ref 98–111)
Creatinine, Ser: 1.24 mg/dL — ABNORMAL HIGH (ref 0.44–1.00)
GFR calc Af Amer: 54 mL/min — ABNORMAL LOW (ref >60)
GFR calc non Af Amer: 47 mL/min — ABNORMAL LOW (ref >60)
Glucose, Bld: 101 mg/dL — ABNORMAL HIGH (ref 70–99)
Potassium: 3.8 mmol/L (ref 3.5–5.1)
Sodium: 143 mmol/L (ref 135–145)
Total Bilirubin: 0.7 mg/dL (ref 0.3–1.2)
Total Protein: 7.5 g/dL (ref 6.5–8.1)

## 2020-01-11 LAB — CBC
HCT: 42.3 % (ref 36.0–46.0)
Hemoglobin: 14 g/dL (ref 12.0–15.0)
MCH: 32.8 pg (ref 26.0–34.0)
MCHC: 33.1 g/dL (ref 30.0–36.0)
MCV: 99.1 fL (ref 80.0–100.0)
Platelets: 283 10*3/uL (ref 150–400)
RBC: 4.27 MIL/uL (ref 3.87–5.11)
RDW: 14.6 % (ref 11.5–15.5)
WBC: 4.9 10*3/uL (ref 4.0–10.5)
nRBC: 0 % (ref 0.0–0.2)

## 2020-01-11 LAB — LIPASE, BLOOD: Lipase: 30 U/L (ref 11–51)

## 2020-01-11 MED ORDER — LOPERAMIDE HCL 2 MG PO CAPS: 2.0000 mg | ORAL_CAPSULE | Freq: Four times a day (QID) | ORAL | 0 refills | Status: AC | PRN

## 2020-01-11 MED ORDER — SODIUM CHLORIDE 0.9 % IV SOLN: 1000.0000 mL | INTRAVENOUS | Status: DC

## 2020-01-11 MED ORDER — SODIUM CHLORIDE 0.9 % IV BOLUS (SEPSIS)
1000.0000 mL | Freq: Once | INTRAVENOUS | Status: AC
  Administered 2020-01-11: 1000 mL via INTRAVENOUS

## 2020-01-11 MED ORDER — CEPHALEXIN 500 MG PO CAPS
500.0000 mg | ORAL_CAPSULE | Freq: Once | ORAL | Status: AC
  Administered 2020-01-11: 500 mg via ORAL
  Filled 2020-01-11: qty 1

## 2020-01-11 MED ORDER — CEPHALEXIN 500 MG PO CAPS: 500.0000 mg | ORAL_CAPSULE | Freq: Four times a day (QID) | ORAL | 0 refills | Status: AC

## 2020-01-11 MED ORDER — DIPHENHYDRAMINE HCL 50 MG/ML IJ SOLN
12.5000 mg | Freq: Once | INTRAMUSCULAR | Status: AC
  Administered 2020-01-11: 12.5 mg via INTRAVENOUS
  Filled 2020-01-11: qty 1

## 2020-01-11 MED ORDER — ONDANSETRON 8 MG PO TBDP: 8.0000 mg | ORAL_TABLET | Freq: Three times a day (TID) | ORAL | 0 refills | Status: AC | PRN

## 2020-01-11 MED ORDER — SODIUM CHLORIDE 0.9% FLUSH: 3.0000 mL | Freq: Once | INTRAVENOUS | Status: DC

## 2020-01-11 MED ORDER — ACETAMINOPHEN 325 MG PO TABS
650.0000 mg | ORAL_TABLET | Freq: Once | ORAL | Status: AC
  Administered 2020-01-11: 650 mg via ORAL
  Filled 2020-01-11: qty 2

## 2020-01-11 MED ORDER — PROMETHAZINE HCL 25 MG/ML IJ SOLN
12.5000 mg | Freq: Once | INTRAMUSCULAR | Status: AC
  Administered 2020-01-11: 12.5 mg via INTRAVENOUS
  Filled 2020-01-11: qty 1

## 2020-01-11 NOTE — Discharge Instructions (Addendum)
Take the antibiotics until finished.  You can take the Zofran and Imodium to help with vomiting and diarrhea symptoms.  Follow-up with your primary care doctor and consider seeing your GI doctor for further evaluation.

## 2020-01-11 NOTE — ED Triage Notes (Signed)
Pt reports having abd pains, vomiting and diarrhea. Reports had hernia surgery May 22. Reports wanted phenergan from PCP due to zofran not working but was advised to go to Ed last night. Also having a headache and no sleep x 3 days even after upping her Trazodone prescription
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
## 2020-01-13 LAB — URINE CULTURE: Culture: >=100000 — AB

## 2020-01-15 ENCOUNTER — Telehealth: Payer: Self-pay | Admitting: Emergency Medicine

## 2020-01-15 NOTE — Telephone Encounter (Signed)
Post ED Visit - Positive Culture Follow-up  Culture report reviewed by antimicrobial stewardship pharmacist: [facility] Team []  Elenor Quinones, Pharm.D. []  Heide Guile, Pharm.D., BCPS AQ-ID []  Parks Neptune, Pharm.D., BCPS []  Alycia Rossetti, Pharm.D., BCPS []  [place], Pharm.D., BCPS, AAHIVP []  Legrand Como, Pharm.D., BCPS, AAHIVP []  Salome Arnt, PharmD, BCPS []  Johnnette Gourd, PharmD, BCPS []  Hughes Better, PharmD, BCPS []  Leeroy Cha, PharmD []  Laqueta [name], PharmD, BCPS []  Albertina Parr, PharmD  [facility] Team []  Leodis Sias, PharmD [x]  Lindell Spar, PharmD []  Royetta Asal, PharmD []  Graylin Shiver, Rph []  Rema Fendt) Glennon Mac, PharmD []  Arlyn Dunning, PharmD []  Netta Cedars, PharmD []  Dia Sitter, PharmD []  Leone Haven, PharmD []  Gretta Arab, PharmD []  Theodis Shove, PharmD []  Peggyann Juba, PharmD []  Reuel Boom, PharmD   Positive urine culture Treated with Cephalexin, organism sensitive to the same and no further patient follow-up is required at this time.  Sandi Raveling Lanny Donoso 01/15/2020, 10:23 AM

## 2020-01-20 ENCOUNTER — Telehealth: Payer: Self-pay | Admitting: Internal Medicine

## 2020-01-20 DIAGNOSIS — J9611 Chronic respiratory failure with hypoxia: Secondary | ICD-10-CM

## 2020-01-20 NOTE — Telephone Encounter (Signed)
My understanding is she was supposed to f/u with [facility] to get her cpap straightened out first so I'm ok with stopping the 02 but strongly advise she see [facility] in f/u as prev recommended

## 2020-01-20 NOTE — Telephone Encounter (Signed)
Called and spoke with pt. Pt stated that she no longer needs her O2 and is wanting to have it picked up by DME. Dr. Melvyn Novas, please advise if you are okay with [place] sending Rx to [facility] for them to come pick up pt's O2?

## 2020-01-20 NOTE — Telephone Encounter (Signed)
lmtcb X1 for pt. Pt has not seen VS since 03/2015, has not seen MW since 11/2017.  Will place order after speaking to pt.

## 2020-01-23 NOTE — Telephone Encounter (Signed)
LMTCB x2  

## 2020-01-23 NOTE — Telephone Encounter (Signed)
Spoke with the pt and notified of recs per Dr Melvyn Novas  Appt with Dr Halford Chessman scheduled  Order sent to D/C o2

## 2020-02-03 ENCOUNTER — Telehealth: Payer: Self-pay | Admitting: Gastroenterology

## 2020-02-03 NOTE — Telephone Encounter (Signed)
Patient with recent ED visit and tx for UTI.  Has had vomiting and diarrhea since her antibiotic tx.  See notes in careeverywhere.  Was instructed to go to the ED per PCP on 7/27.  Please advise.  She has phenergan and zofran at home.

## 2020-02-03 NOTE — Telephone Encounter (Signed)
Left message for patient to call back  

## 2020-02-03 NOTE — Telephone Encounter (Signed)
If she has received antibiotics recently and having persistent diarrhea, would send stool for C diff. She should use antiemetics for vomiting. Given it is Friday afternoon her only option is the ED if severe symptoms over the weekend, otherwise not sure if there is room to add her to APP or my schedule in the next week or so. thanks

## 2020-02-03 NOTE — Telephone Encounter (Signed)
Patient called states she is having trouble eating and keeping anything down. She is loosing weight and does not know what's going on please advise.

## 2020-02-07 NOTE — Telephone Encounter (Signed)
No return call from the patient.  Patient was evaluated by her PCP yesterday for UTI.  I will await a return call from the patient .

## 2020-03-26 ENCOUNTER — Institutional Professional Consult (permissible substitution): Payer: Medicaid Other | Admitting: Pulmonary Disease

## 2021-09-16 ENCOUNTER — Encounter: Payer: Self-pay | Admitting: Gastroenterology
